# Patient Record
Sex: Male | Born: 1950 | Race: Black or African American | Hispanic: No | Smoking: Former smoker
Health system: Southern US, Community
[De-identification: ages and names within clinical notes are randomized; demographics above are authoritative.]

## PROBLEM LIST (undated history)

## (undated) DIAGNOSIS — K759 Inflammatory liver disease, unspecified: Secondary | ICD-10-CM

## (undated) DIAGNOSIS — D649 Anemia, unspecified: Secondary | ICD-10-CM

## (undated) DIAGNOSIS — C801 Malignant (primary) neoplasm, unspecified: Secondary | ICD-10-CM

## (undated) DIAGNOSIS — D759 Disease of blood and blood-forming organs, unspecified: Secondary | ICD-10-CM

## (undated) DIAGNOSIS — I10 Essential (primary) hypertension: Secondary | ICD-10-CM

## (undated) DIAGNOSIS — N289 Disorder of kidney and ureter, unspecified: Secondary | ICD-10-CM

## (undated) HISTORY — PX: OTHER SURGICAL HISTORY: SHX169

## (undated) HISTORY — PX: LUNG SURGERY: SHX703

---

## 1998-04-10 ENCOUNTER — Emergency Department (HOSPITAL_COMMUNITY): Admission: EM | Admit: 1998-04-10 | Discharge: 1998-04-10 | Payer: Self-pay | Admitting: Emergency Medicine

## 2000-07-24 ENCOUNTER — Emergency Department (HOSPITAL_COMMUNITY): Admission: EM | Admit: 2000-07-24 | Discharge: 2000-07-24 | Payer: Self-pay | Admitting: Emergency Medicine

## 2000-08-22 ENCOUNTER — Emergency Department (HOSPITAL_COMMUNITY): Admission: EM | Admit: 2000-08-22 | Discharge: 2000-08-22 | Payer: Self-pay | Admitting: Emergency Medicine

## 2001-01-05 ENCOUNTER — Encounter: Payer: Self-pay | Admitting: Emergency Medicine

## 2001-01-05 ENCOUNTER — Emergency Department (HOSPITAL_COMMUNITY): Admission: EM | Admit: 2001-01-05 | Discharge: 2001-01-05 | Payer: Self-pay | Admitting: Emergency Medicine

## 2004-07-09 ENCOUNTER — Emergency Department (HOSPITAL_COMMUNITY): Admission: EM | Admit: 2004-07-09 | Discharge: 2004-07-09 | Payer: Self-pay | Admitting: Family Medicine

## 2006-04-16 ENCOUNTER — Emergency Department (HOSPITAL_COMMUNITY): Admission: EM | Admit: 2006-04-16 | Discharge: 2006-04-16 | Payer: Self-pay | Admitting: Family Medicine

## 2006-05-03 ENCOUNTER — Emergency Department (HOSPITAL_COMMUNITY): Admission: EM | Admit: 2006-05-03 | Discharge: 2006-05-03 | Payer: Self-pay | Admitting: Family Medicine

## 2006-07-02 ENCOUNTER — Emergency Department (HOSPITAL_COMMUNITY): Admission: EM | Admit: 2006-07-02 | Discharge: 2006-07-02 | Payer: Self-pay | Admitting: Emergency Medicine

## 2006-07-15 ENCOUNTER — Ambulatory Visit (HOSPITAL_COMMUNITY): Admission: RE | Admit: 2006-07-15 | Discharge: 2006-07-15 | Payer: Self-pay | Admitting: Internal Medicine

## 2006-07-15 ENCOUNTER — Ambulatory Visit: Payer: Self-pay | Admitting: Internal Medicine

## 2006-08-02 ENCOUNTER — Ambulatory Visit: Payer: Self-pay | Admitting: Internal Medicine

## 2006-09-16 ENCOUNTER — Ambulatory Visit: Payer: Self-pay | Admitting: Internal Medicine

## 2006-10-22 ENCOUNTER — Ambulatory Visit: Payer: Self-pay | Admitting: Internal Medicine

## 2006-12-03 ENCOUNTER — Ambulatory Visit: Payer: Self-pay | Admitting: Gastroenterology

## 2006-12-17 ENCOUNTER — Ambulatory Visit: Payer: Self-pay | Admitting: Internal Medicine

## 2006-12-17 LAB — CONVERTED CEMR LAB
BUN: 14 mg/dL (ref 6–23)
CO2: 27 meq/L (ref 19–32)
Cholesterol: 108 mg/dL (ref 0–200)
Creatinine, Ser: 1.4 mg/dL (ref 0.4–1.5)
Glucose, Bld: 187 mg/dL — ABNORMAL HIGH (ref 70–99)
HDL: 39.2 mg/dL (ref >39.0)
Microalb, Ur: 36 mg/dL (ref 0.0–1.9)
Potassium: 3.9 meq/L (ref 3.5–5.1)
Sodium: 142 meq/L (ref 135–145)
Total CHOL/HDL Ratio: 2.8
Triglycerides: 170 mg/dL — ABNORMAL HIGH (ref 0–149)

## 2007-01-20 ENCOUNTER — Ambulatory Visit: Payer: Self-pay | Admitting: Internal Medicine

## 2007-02-04 ENCOUNTER — Ambulatory Visit: Payer: Self-pay | Admitting: Internal Medicine

## 2007-02-04 LAB — CONVERTED CEMR LAB
BUN: 17 mg/dL (ref 6–23)
CO2: 27 meq/L (ref 19–32)
Calcium: 9 mg/dL (ref 8.4–10.5)
Chloride: 114 meq/L — ABNORMAL HIGH (ref 96–112)
GFR calc Af Amer: 62 mL/min

## 2007-02-24 ENCOUNTER — Ambulatory Visit: Payer: Self-pay | Admitting: Internal Medicine

## 2007-04-08 ENCOUNTER — Ambulatory Visit: Payer: Self-pay | Admitting: Internal Medicine

## 2007-04-08 LAB — CONVERTED CEMR LAB
Calcium: 8.9 mg/dL (ref 8.4–10.5)
Chloride: 109 meq/L (ref 96–112)
Creatinine, Ser: 1.8 mg/dL — ABNORMAL HIGH (ref 0.4–1.5)
Creatinine,U: 210 mg/dL
GFR calc non Af Amer: 42 mL/min
Glucose, Bld: 237 mg/dL — ABNORMAL HIGH (ref 70–99)
Hgb A1c MFr Bld: 7.6 % — ABNORMAL HIGH (ref 4.6–6.0)
Microalb Creat Ratio: 68.1 mg/g — ABNORMAL HIGH (ref 0.0–30.0)

## 2007-05-27 ENCOUNTER — Encounter: Payer: Self-pay | Admitting: Internal Medicine

## 2007-05-27 DIAGNOSIS — Z992 Dependence on renal dialysis: Secondary | ICD-10-CM

## 2007-05-27 DIAGNOSIS — F1011 Alcohol abuse, in remission: Secondary | ICD-10-CM | POA: Insufficient documentation

## 2007-05-27 DIAGNOSIS — N186 End stage renal disease: Secondary | ICD-10-CM

## 2007-05-27 DIAGNOSIS — F1411 Cocaine abuse, in remission: Secondary | ICD-10-CM | POA: Insufficient documentation

## 2007-05-27 DIAGNOSIS — E1122 Type 2 diabetes mellitus with diabetic chronic kidney disease: Secondary | ICD-10-CM

## 2007-05-27 DIAGNOSIS — F1111 Opioid abuse, in remission: Secondary | ICD-10-CM | POA: Insufficient documentation

## 2007-05-27 DIAGNOSIS — F528 Other sexual dysfunction not due to a substance or known physiological condition: Secondary | ICD-10-CM

## 2007-05-27 DIAGNOSIS — B1710 Acute hepatitis C without hepatic coma: Secondary | ICD-10-CM | POA: Insufficient documentation

## 2007-07-25 ENCOUNTER — Ambulatory Visit: Payer: Self-pay | Admitting: Internal Medicine

## 2007-07-25 LAB — CONVERTED CEMR LAB
Chloride: 114 meq/L — ABNORMAL HIGH (ref 96–112)
Creatinine,U: 86.4 mg/dL
GFR calc non Af Amer: 51 mL/min
Potassium: 3.7 meq/L (ref 3.5–5.1)
Sodium: 148 meq/L — ABNORMAL HIGH (ref 135–145)

## 2007-09-03 ENCOUNTER — Encounter: Admission: RE | Admit: 2007-09-03 | Discharge: 2007-09-03 | Payer: Self-pay | Admitting: Internal Medicine

## 2007-11-21 IMAGING — CR DG RIBS W/ CHEST 3+V*L*
3 series · 3 of 3 positions shown · non-contrast
Comparison: 07/09/04.

CLINICAL DATA: Left chest trauma after a fall.
 LEFT RIBS WITH CHEST ? 3 VIEW:

[view not recorded (1 of 3)]
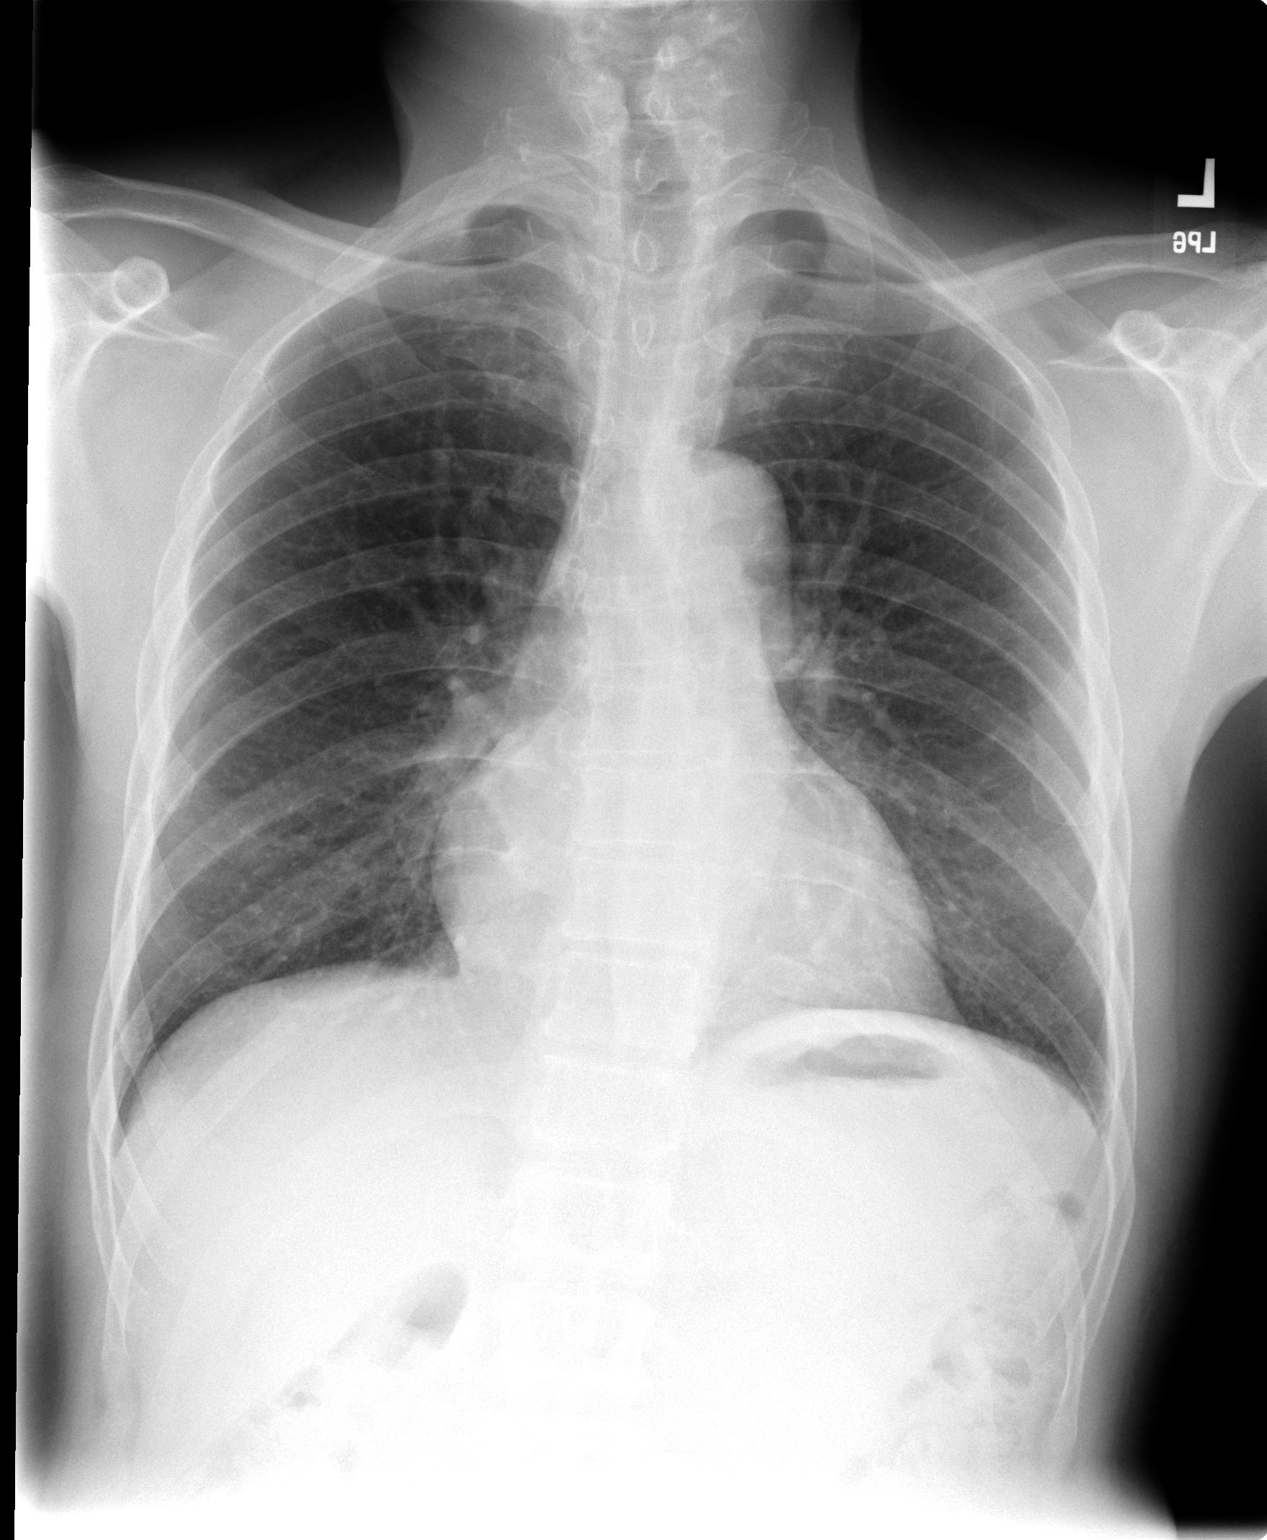

[view not recorded (2 of 3)]
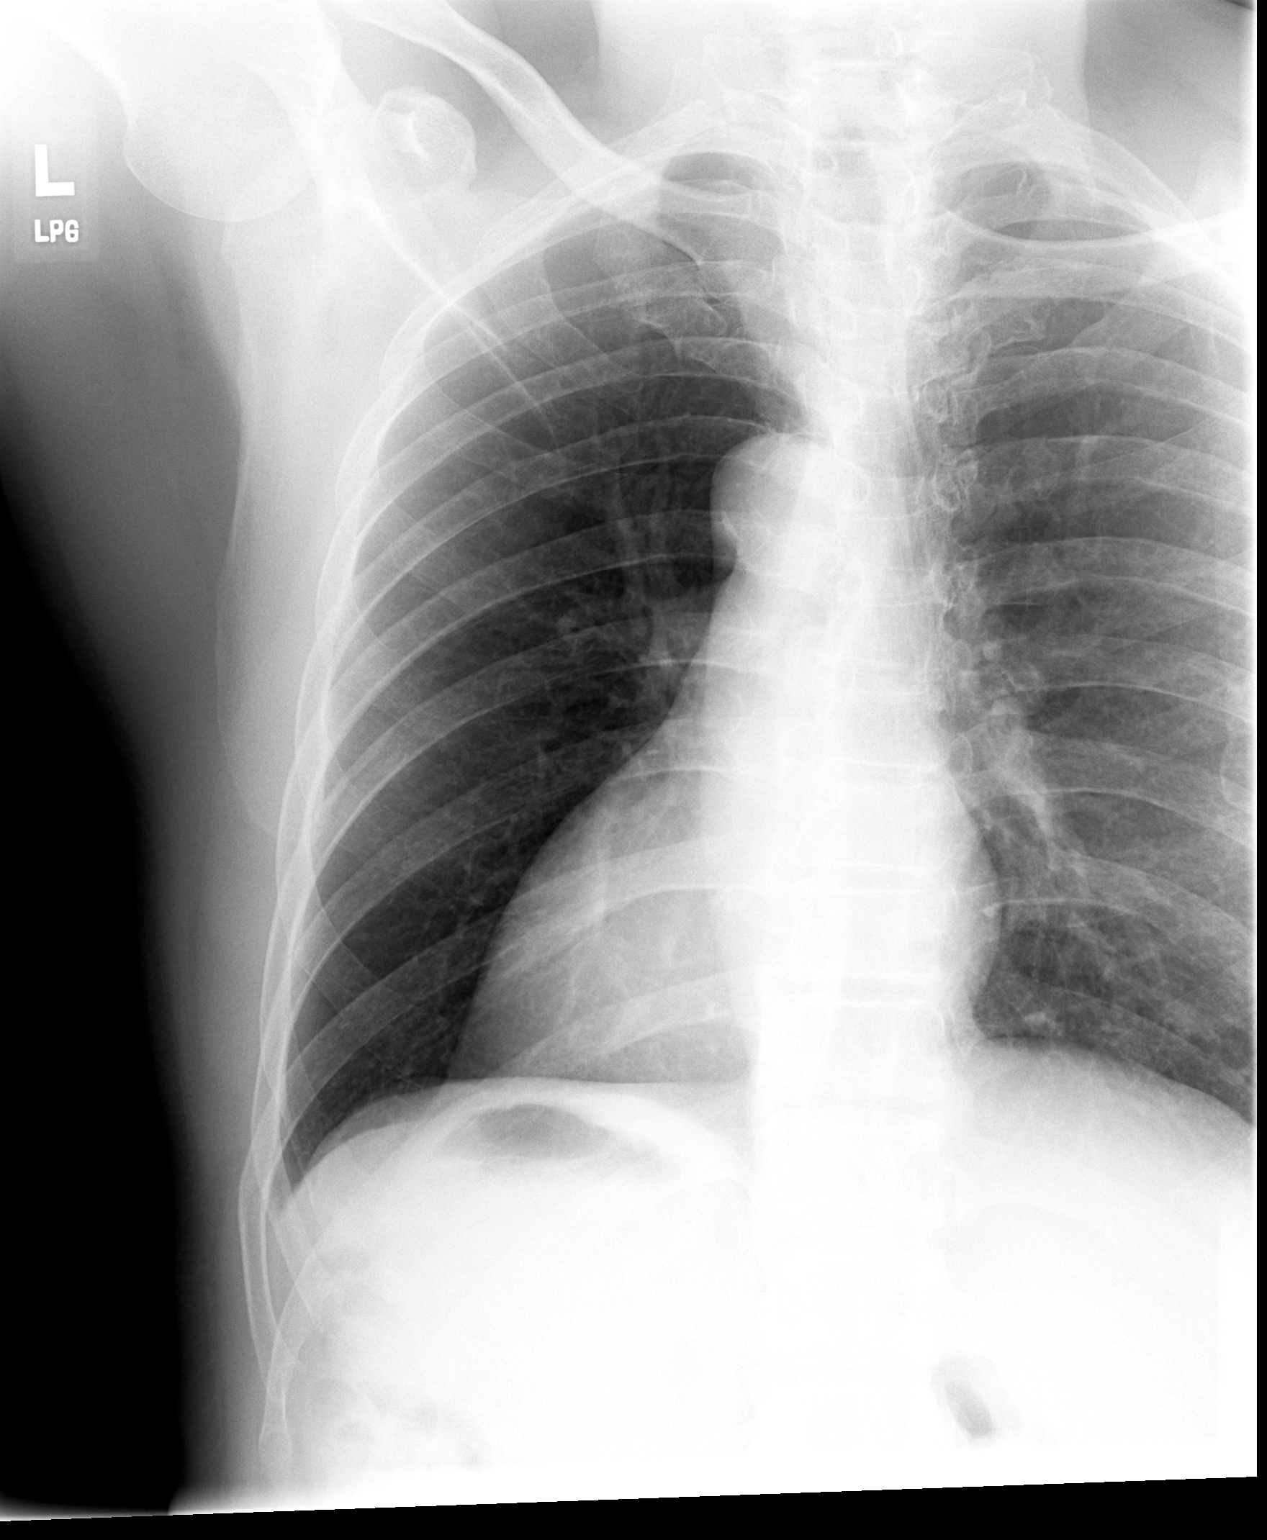

[view not recorded (3 of 3)]
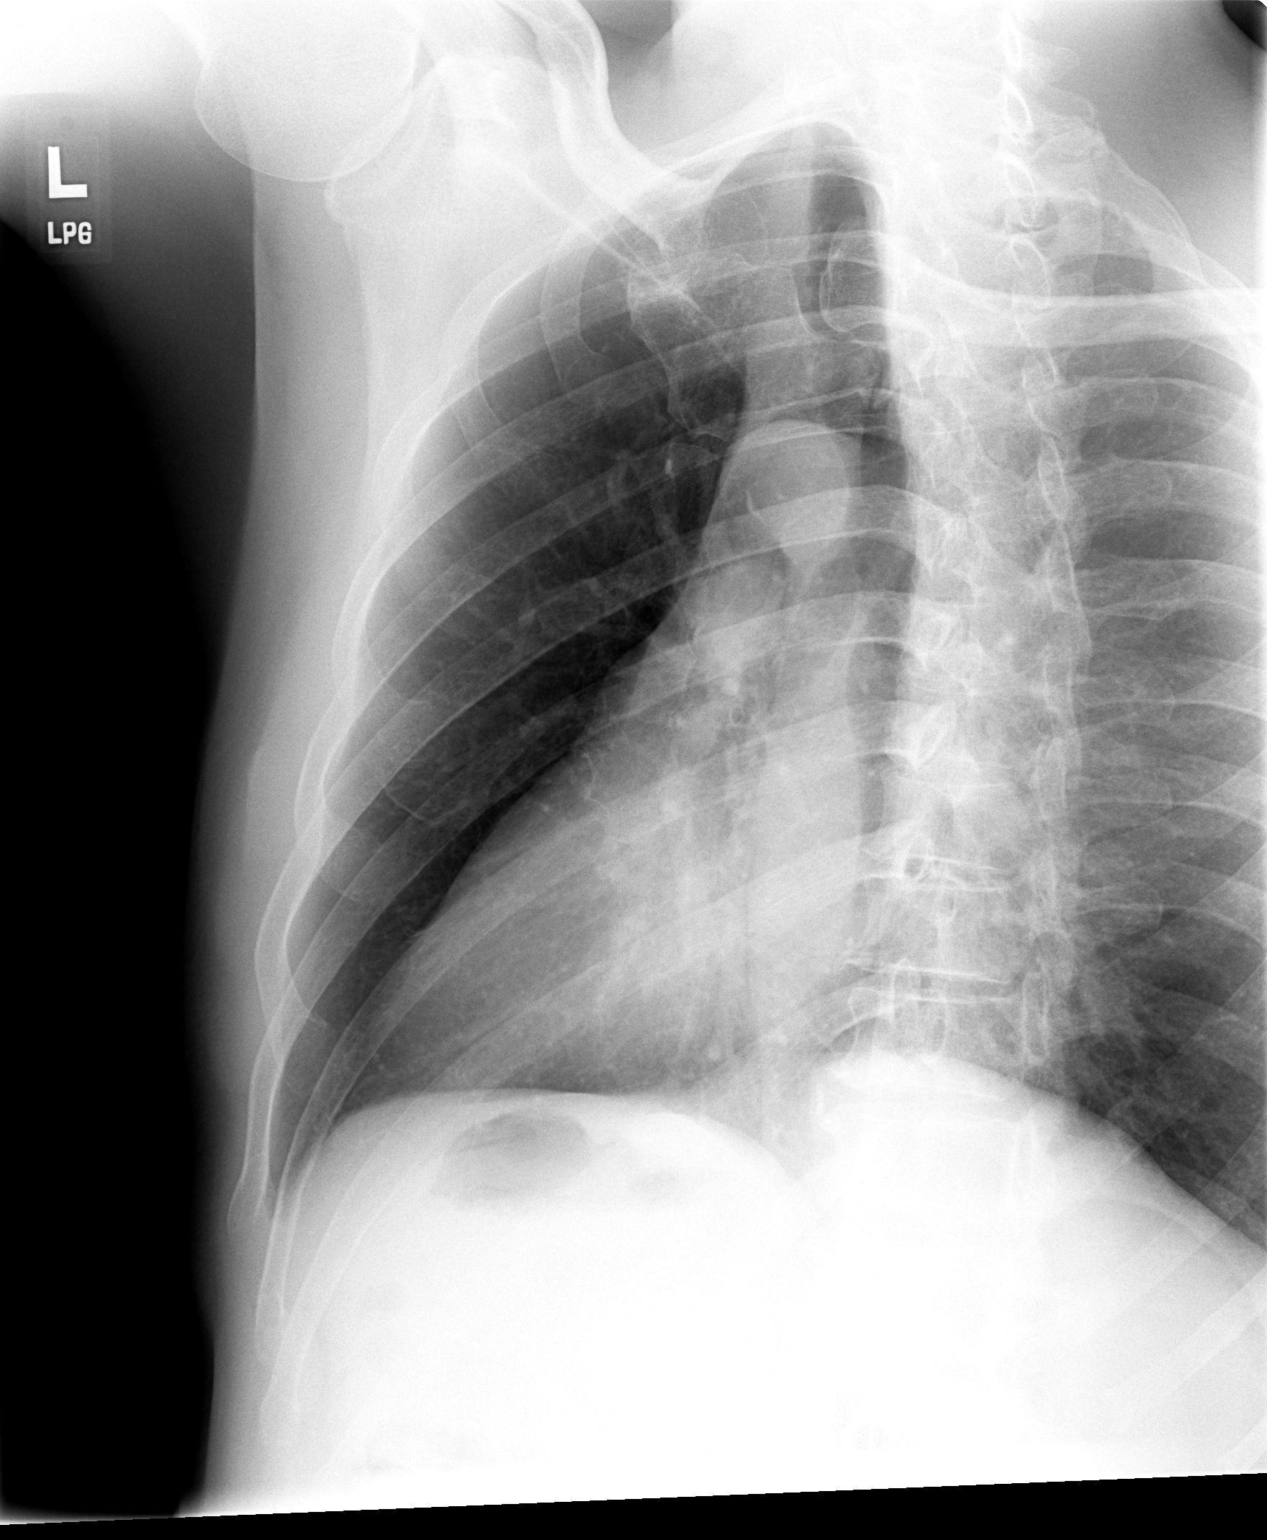

[3 of 3 positions shown; findings below may reference images not displayed]

FINDINGS: Frontal view of the chest shows midline trachea and stable heart size.  Lungs are clear.  No pneumothorax.  No pleural fluid.  
 Dedicated views of the left ribs show no definite acute fracture.
IMPRESSION: No acute findings.

## 2007-11-25 ENCOUNTER — Ambulatory Visit: Payer: Self-pay | Admitting: Internal Medicine

## 2007-11-25 DIAGNOSIS — I10 Essential (primary) hypertension: Secondary | ICD-10-CM

## 2007-11-28 ENCOUNTER — Telehealth: Payer: Self-pay | Admitting: Internal Medicine

## 2007-11-28 LAB — CONVERTED CEMR LAB
BUN: 22 mg/dL
CO2: 30 meq/L
Calcium: 9.1 mg/dL
Chloride: 106 meq/L
Creatinine, Ser: 1.9 mg/dL — ABNORMAL HIGH
Creatinine,U: 126.4 mg/dL
GFR calc Af Amer: 47 mL/min
GFR calc non Af Amer: 39 mL/min
Glucose, Bld: 308 mg/dL — ABNORMAL HIGH
Hgb A1c MFr Bld: 8.2 % — ABNORMAL HIGH
Microalb Creat Ratio: 1318.8 mg/g — ABNORMAL HIGH
Microalb, Ur: 166.7 mg/dL — ABNORMAL HIGH
Potassium: 4.5 meq/L
Sodium: 144 meq/L

## 2007-12-03 ENCOUNTER — Telehealth: Payer: Self-pay | Admitting: Internal Medicine

## 2008-01-09 ENCOUNTER — Telehealth: Payer: Self-pay | Admitting: Internal Medicine

## 2008-01-26 ENCOUNTER — Inpatient Hospital Stay (HOSPITAL_COMMUNITY): Admission: EM | Admit: 2008-01-26 | Discharge: 2008-02-01 | Payer: Self-pay | Admitting: Emergency Medicine

## 2008-01-27 ENCOUNTER — Ambulatory Visit: Payer: Self-pay | Admitting: Internal Medicine

## 2008-02-01 ENCOUNTER — Telehealth: Payer: Self-pay | Admitting: Internal Medicine

## 2008-02-09 ENCOUNTER — Ambulatory Visit: Payer: Self-pay | Admitting: Internal Medicine

## 2008-02-09 DIAGNOSIS — IMO0002 Reserved for concepts with insufficient information to code with codable children: Secondary | ICD-10-CM

## 2008-02-09 LAB — CONVERTED CEMR LAB: Vit D, 1,25-Dihydroxy: 26 — ABNORMAL LOW (ref 30–89)

## 2008-02-10 ENCOUNTER — Telehealth: Payer: Self-pay | Admitting: Internal Medicine

## 2008-03-04 ENCOUNTER — Ambulatory Visit: Payer: Self-pay | Admitting: Gastroenterology

## 2008-03-19 ENCOUNTER — Ambulatory Visit: Payer: Self-pay | Admitting: Internal Medicine

## 2008-03-19 DIAGNOSIS — E559 Vitamin D deficiency, unspecified: Secondary | ICD-10-CM | POA: Insufficient documentation

## 2008-04-08 ENCOUNTER — Ambulatory Visit: Payer: Self-pay | Admitting: Gastroenterology

## 2008-04-27 ENCOUNTER — Telehealth: Payer: Self-pay | Admitting: Internal Medicine

## 2008-04-30 ENCOUNTER — Ambulatory Visit: Payer: Self-pay | Admitting: Internal Medicine

## 2008-08-24 ENCOUNTER — Ambulatory Visit: Payer: Self-pay | Admitting: Gastroenterology

## 2009-09-29 ENCOUNTER — Inpatient Hospital Stay (HOSPITAL_COMMUNITY): Admission: EM | Admit: 2009-09-29 | Discharge: 2009-10-04 | Payer: Self-pay | Admitting: Emergency Medicine

## 2010-11-12 LAB — CONVERTED CEMR LAB
CO2: 26 meq/L (ref 19–32)
Calcium: 8.8 mg/dL (ref 8.4–10.5)
Chloride: 108 meq/L (ref 96–112)
Sodium: 140 meq/L (ref 135–145)

## 2011-01-15 LAB — GLUCOSE, CAPILLARY
Glucose-Capillary: 102 mg/dL — ABNORMAL HIGH (ref 70–99)
Glucose-Capillary: 110 mg/dL — ABNORMAL HIGH (ref 70–99)
Glucose-Capillary: 117 mg/dL — ABNORMAL HIGH (ref 70–99)
Glucose-Capillary: 124 mg/dL — ABNORMAL HIGH (ref 70–99)
Glucose-Capillary: 126 mg/dL — ABNORMAL HIGH (ref 70–99)
Glucose-Capillary: 129 mg/dL — ABNORMAL HIGH (ref 70–99)
Glucose-Capillary: 142 mg/dL — ABNORMAL HIGH (ref 70–99)
Glucose-Capillary: 145 mg/dL — ABNORMAL HIGH (ref 70–99)
Glucose-Capillary: 157 mg/dL — ABNORMAL HIGH (ref 70–99)
Glucose-Capillary: 159 mg/dL — ABNORMAL HIGH (ref 70–99)
Glucose-Capillary: 160 mg/dL — ABNORMAL HIGH (ref 70–99)
Glucose-Capillary: 160 mg/dL — ABNORMAL HIGH (ref 70–99)
Glucose-Capillary: 168 mg/dL — ABNORMAL HIGH (ref 70–99)
Glucose-Capillary: 194 mg/dL — ABNORMAL HIGH (ref 70–99)
Glucose-Capillary: 209 mg/dL — ABNORMAL HIGH (ref 70–99)
Glucose-Capillary: 217 mg/dL — ABNORMAL HIGH (ref 70–99)
Glucose-Capillary: 269 mg/dL — ABNORMAL HIGH (ref 70–99)
Glucose-Capillary: 415 mg/dL — ABNORMAL HIGH (ref 70–99)
Glucose-Capillary: 51 mg/dL — ABNORMAL LOW (ref 70–99)
Glucose-Capillary: 69 mg/dL — ABNORMAL LOW (ref 70–99)
Glucose-Capillary: 95 mg/dL (ref 70–99)

## 2011-01-15 LAB — CBC
HCT: 32.4 % — ABNORMAL LOW (ref 39.0–52.0)
HCT: 43.2 % (ref 39.0–52.0)
Hemoglobin: 11.2 g/dL — ABNORMAL LOW (ref 13.0–17.0)
Hemoglobin: 13.1 g/dL (ref 13.0–17.0)
MCHC: 33.7 g/dL (ref 30.0–36.0)
MCHC: 33.7 g/dL (ref 30.0–36.0)
MCHC: 34.1 g/dL (ref 30.0–36.0)
MCHC: 34.1 g/dL (ref 30.0–36.0)
MCHC: 34.7 g/dL (ref 30.0–36.0)
Platelets: 157 10*3/uL (ref 150–400)
Platelets: 174 10*3/uL (ref 150–400)
Platelets: 213 10*3/uL (ref 150–400)
RBC: 3.13 MIL/uL — ABNORMAL LOW (ref 4.22–5.81)
RBC: 3.83 MIL/uL — ABNORMAL LOW (ref 4.22–5.81)
RDW: 13.9 % (ref 11.5–15.5)
RDW: 14.2 % (ref 11.5–15.5)
RDW: 14.9 % (ref 11.5–15.5)
WBC: 12.3 10*3/uL — ABNORMAL HIGH (ref 4.0–10.5)
WBC: 5.6 10*3/uL (ref 4.0–10.5)

## 2011-01-15 LAB — BASIC METABOLIC PANEL
BUN: 40 mg/dL — ABNORMAL HIGH (ref 6–23)
BUN: 75 mg/dL — ABNORMAL HIGH (ref 6–23)
CO2: 25 mEq/L (ref 19–32)
CO2: 27 mEq/L (ref 19–32)
Calcium: 7.8 mg/dL — ABNORMAL LOW (ref 8.4–10.5)
Calcium: 8.2 mg/dL — ABNORMAL LOW (ref 8.4–10.5)
Creatinine, Ser: 1.74 mg/dL — ABNORMAL HIGH (ref 0.4–1.5)
Creatinine, Ser: 2.32 mg/dL — ABNORMAL HIGH (ref 0.4–1.5)
GFR calc non Af Amer: 11 mL/min — ABNORMAL LOW (ref >60)
GFR calc non Af Amer: 29 mL/min — ABNORMAL LOW (ref >60)
Glucose, Bld: 112 mg/dL — ABNORMAL HIGH (ref 70–99)
Glucose, Bld: 124 mg/dL — ABNORMAL HIGH (ref 70–99)
Potassium: 3.5 mEq/L (ref 3.5–5.1)
Sodium: 139 mEq/L (ref 135–145)
Sodium: 142 mEq/L (ref 135–145)

## 2011-01-15 LAB — CLOSTRIDIUM DIFFICILE EIA: C difficile Toxins A+B, EIA: NEGATIVE

## 2011-01-15 LAB — DIFFERENTIAL
Basophils Absolute: 0 10*3/uL (ref 0.0–0.1)
Basophils Absolute: 0 10*3/uL (ref 0.0–0.1)
Basophils Relative: 0 % (ref 0–1)
Basophils Relative: 0 % (ref 0–1)
Eosinophils Absolute: 0 10*3/uL (ref 0.0–0.7)
Eosinophils Absolute: 0.1 10*3/uL (ref 0.0–0.7)
Eosinophils Relative: 0 % (ref 0–5)
Eosinophils Relative: 2 % (ref 0–5)
Lymphocytes Relative: 18 % (ref 12–46)
Lymphocytes Relative: 33 % (ref 12–46)
Lymphocytes Relative: 7 % — ABNORMAL LOW (ref 12–46)
Lymphs Abs: 0.9 10*3/uL (ref 0.7–4.0)
Lymphs Abs: 1.3 10*3/uL (ref 0.7–4.0)
Lymphs Abs: 1.7 10*3/uL (ref 0.7–4.0)
Lymphs Abs: 1.9 10*3/uL (ref 0.7–4.0)
Monocytes Absolute: 0.6 10*3/uL (ref 0.1–1.0)
Monocytes Absolute: 0.6 10*3/uL (ref 0.1–1.0)
Monocytes Relative: 10 % (ref 3–12)
Monocytes Relative: 12 % (ref 3–12)
Neutro Abs: 10.8 10*3/uL — ABNORMAL HIGH (ref 1.7–7.7)
Neutro Abs: 3.2 10*3/uL (ref 1.7–7.7)
Neutro Abs: 4.2 10*3/uL (ref 1.7–7.7)
Neutrophils Relative %: 55 % (ref 43–77)
Neutrophils Relative %: 71 % (ref 43–77)
Neutrophils Relative %: 76 % (ref 43–77)

## 2011-01-15 LAB — CULTURE, BLOOD (ROUTINE X 2)

## 2011-01-15 LAB — PHOSPHORUS
Phosphorus: 1.8 mg/dL — ABNORMAL LOW (ref 2.3–4.6)
Phosphorus: 2.4 mg/dL (ref 2.3–4.6)

## 2011-01-15 LAB — COMPREHENSIVE METABOLIC PANEL
ALT: 18 U/L (ref 0–53)
ALT: 24 U/L (ref 0–53)
AST: 27 U/L (ref 0–37)
Albumin: 2.7 g/dL — ABNORMAL LOW (ref 3.5–5.2)
Alkaline Phosphatase: 86 U/L (ref 39–117)
BUN: 23 mg/dL (ref 6–23)
CO2: 25 mEq/L (ref 19–32)
CO2: 32 mEq/L (ref 19–32)
Calcium: 7.7 mg/dL — ABNORMAL LOW (ref 8.4–10.5)
Calcium: 7.8 mg/dL — ABNORMAL LOW (ref 8.4–10.5)
Calcium: 8 mg/dL — ABNORMAL LOW (ref 8.4–10.5)
GFR calc Af Amer: 49 mL/min — ABNORMAL LOW (ref >60)
GFR calc non Af Amer: 14 mL/min — ABNORMAL LOW (ref >60)
GFR calc non Af Amer: 41 mL/min — ABNORMAL LOW (ref >60)
Glucose, Bld: 115 mg/dL — ABNORMAL HIGH (ref 70–99)
Glucose, Bld: 94 mg/dL (ref 70–99)
Potassium: 3.5 mEq/L (ref 3.5–5.1)
Sodium: 140 mEq/L (ref 135–145)
Sodium: 143 mEq/L (ref 135–145)
Total Bilirubin: 0.7 mg/dL (ref 0.3–1.2)
Total Protein: 6 g/dL (ref 6.0–8.3)

## 2011-01-15 LAB — POCT I-STAT 3, VENOUS BLOOD GAS (G3P V)
Bicarbonate: 40.1 mEq/L — ABNORMAL HIGH (ref 20.0–24.0)
TCO2: 42 mmol/L (ref 0–100)
pCO2, Ven: 47.9 mmHg (ref 45.0–50.0)
pH, Ven: 7.531 — ABNORMAL HIGH (ref 7.250–7.300)
pO2, Ven: 22 mmHg — CL (ref 30.0–45.0)

## 2011-01-15 LAB — CK TOTAL AND CKMB (NOT AT ARMC)
CK, MB: 9.7 ng/mL — ABNORMAL HIGH (ref 0.3–4.0)
Relative Index: 0.9 (ref 0.0–2.5)

## 2011-01-15 LAB — GRAM STAIN

## 2011-01-15 LAB — TROPONIN I: Troponin I: 0.11 ng/mL — ABNORMAL HIGH (ref 0.00–0.06)

## 2011-01-15 LAB — LACTIC ACID, PLASMA
Lactic Acid, Venous: 0.9 mmol/L (ref 0.5–2.2)
Lactic Acid, Venous: 1.3 mmol/L (ref 0.5–2.2)

## 2011-01-15 LAB — URINE DRUGS OF ABUSE SCREEN W ALC, ROUTINE (REF LAB)
Amphetamine Screen, Ur: NEGATIVE
Barbiturate Quant, Ur: NEGATIVE
Benzodiazepines.: NEGATIVE
Marijuana Metabolite: POSITIVE — AB
Methadone: NEGATIVE
Phencyclidine (PCP): NEGATIVE

## 2011-01-15 LAB — MAGNESIUM
Magnesium: 1.7 mg/dL (ref 1.5–2.5)
Magnesium: 2.2 mg/dL (ref 1.5–2.5)
Magnesium: 2.5 mg/dL (ref 1.5–2.5)

## 2011-01-15 LAB — CARDIAC PANEL(CRET KIN+CKTOT+MB+TROPI)
CK, MB: 6.3 ng/mL — ABNORMAL HIGH (ref 0.3–4.0)
CK, MB: 6.9 ng/mL — ABNORMAL HIGH (ref 0.3–4.0)

## 2011-01-15 LAB — URINALYSIS, ROUTINE W REFLEX MICROSCOPIC
Glucose, UA: NEGATIVE mg/dL
Ketones, ur: NEGATIVE mg/dL
Protein, ur: 100 mg/dL — AB

## 2011-01-15 LAB — URINE MICROSCOPIC-ADD ON

## 2011-01-15 LAB — HEMOGLOBIN A1C
Hgb A1c MFr Bld: 6.8 % — ABNORMAL HIGH (ref 4.6–6.1)
Mean Plasma Glucose: 148 mg/dL

## 2011-01-15 LAB — LIPASE, BLOOD: Lipase: 37 U/L (ref 11–59)

## 2011-01-15 LAB — URINE CULTURE: Colony Count: 2000

## 2011-01-15 LAB — KETONES, QUALITATIVE: Acetone, Bld: NEGATIVE

## 2011-01-15 LAB — TSH: TSH: 0.568 u[IU]/mL (ref 0.350–4.500)

## 2011-02-14 ENCOUNTER — Inpatient Hospital Stay (HOSPITAL_COMMUNITY)
Admission: EM | Admit: 2011-02-14 | Discharge: 2011-02-17 | DRG: 392 | Disposition: A | Discharge status: Home / Self Care | Payer: Medicare Other | Source: Ambulatory Visit | Attending: Internal Medicine | Admitting: Internal Medicine

## 2011-02-14 ENCOUNTER — Emergency Department (HOSPITAL_COMMUNITY): Payer: Medicare Other

## 2011-02-14 DIAGNOSIS — Z794 Long term (current) use of insulin: Secondary | ICD-10-CM

## 2011-02-14 DIAGNOSIS — K208 Other esophagitis without bleeding: Principal | ICD-10-CM | POA: Diagnosis present

## 2011-02-14 DIAGNOSIS — I129 Hypertensive chronic kidney disease with stage 1 through stage 4 chronic kidney disease, or unspecified chronic kidney disease: Secondary | ICD-10-CM | POA: Diagnosis present

## 2011-02-14 DIAGNOSIS — Z7982 Long term (current) use of aspirin: Secondary | ICD-10-CM

## 2011-02-14 DIAGNOSIS — F1010 Alcohol abuse, uncomplicated: Secondary | ICD-10-CM | POA: Diagnosis present

## 2011-02-14 DIAGNOSIS — F1210 Cannabis abuse, uncomplicated: Secondary | ICD-10-CM | POA: Diagnosis present

## 2011-02-14 DIAGNOSIS — B1920 Unspecified viral hepatitis C without hepatic coma: Secondary | ICD-10-CM | POA: Diagnosis present

## 2011-02-14 DIAGNOSIS — E119 Type 2 diabetes mellitus without complications: Secondary | ICD-10-CM | POA: Diagnosis present

## 2011-02-14 DIAGNOSIS — F17200 Nicotine dependence, unspecified, uncomplicated: Secondary | ICD-10-CM | POA: Diagnosis present

## 2011-02-14 DIAGNOSIS — N183 Chronic kidney disease, stage 3 unspecified: Secondary | ICD-10-CM | POA: Diagnosis present

## 2011-02-14 DIAGNOSIS — N179 Acute kidney failure, unspecified: Secondary | ICD-10-CM | POA: Diagnosis present

## 2011-02-14 DIAGNOSIS — E785 Hyperlipidemia, unspecified: Secondary | ICD-10-CM | POA: Diagnosis present

## 2011-02-14 LAB — CARDIAC PANEL(CRET KIN+CKTOT+MB+TROPI)
CK, MB: 4.1 ng/mL — ABNORMAL HIGH (ref 0.3–4.0)
Relative Index: 1.2 (ref 0.0–2.5)
Relative Index: 1.3 (ref 0.0–2.5)
Total CK: 344 U/L — ABNORMAL HIGH (ref 7–232)
Total CK: 379 U/L — ABNORMAL HIGH (ref 7–232)
Troponin I: <0.3 ng/mL (ref <0.30)

## 2011-02-14 LAB — URINALYSIS, ROUTINE W REFLEX MICROSCOPIC
Bilirubin Urine: NEGATIVE
Ketones, ur: NEGATIVE mg/dL
Nitrite: NEGATIVE
Protein, ur: 100 mg/dL — AB
Urobilinogen, UA: 0.2 mg/dL (ref 0.0–1.0)
pH: 7.5 (ref 5.0–8.0)

## 2011-02-14 LAB — CBC
HCT: 35.5 % — ABNORMAL LOW (ref 39.0–52.0)
MCH: 33.2 pg (ref 26.0–34.0)
MCHC: 34.6 g/dL (ref 30.0–36.0)
RDW: 13.2 % (ref 11.5–15.5)

## 2011-02-14 LAB — POCT I-STAT, CHEM 8
Calcium, Ion: 0.89 mmol/L — ABNORMAL LOW (ref 1.12–1.32)
Chloride: 100 mEq/L (ref 96–112)
Glucose, Bld: 278 mg/dL — ABNORMAL HIGH (ref 70–99)
HCT: 38 % — ABNORMAL LOW (ref 39.0–52.0)

## 2011-02-14 LAB — COMPREHENSIVE METABOLIC PANEL
ALT: 22 U/L (ref 0–53)
Albumin: 3.4 g/dL — ABNORMAL LOW (ref 3.5–5.2)
Alkaline Phosphatase: 106 U/L (ref 39–117)
BUN: 79 mg/dL — ABNORMAL HIGH (ref 6–23)
Chloride: 90 mEq/L — ABNORMAL LOW (ref 96–112)
Potassium: 3.3 mEq/L — ABNORMAL LOW (ref 3.5–5.1)
Sodium: 133 mEq/L — ABNORMAL LOW (ref 135–145)
Total Bilirubin: 0.6 mg/dL (ref 0.3–1.2)

## 2011-02-14 LAB — POCT CARDIAC MARKERS
CKMB, poc: 2.4 ng/mL (ref 1.0–8.0)
Myoglobin, poc: >500 ng/mL (ref 12–200)
Myoglobin, poc: >500 ng/mL (ref 12–200)
Troponin i, poc: <0.05 ng/mL (ref 0.00–0.09)

## 2011-02-14 LAB — GLUCOSE, CAPILLARY
Glucose-Capillary: 186 mg/dL — ABNORMAL HIGH (ref 70–99)
Glucose-Capillary: 75 mg/dL (ref 70–99)
Glucose-Capillary: 133 mg/dL — ABNORMAL HIGH (ref 70–99)

## 2011-02-14 LAB — PROTIME-INR: Prothrombin Time: 12.1 seconds (ref 11.6–15.2)

## 2011-02-15 ENCOUNTER — Other Ambulatory Visit: Payer: Self-pay | Admitting: Gastroenterology

## 2011-02-15 LAB — CBC
HCT: 31 % — ABNORMAL LOW (ref 39.0–52.0)
MCHC: 33.9 g/dL (ref 30.0–36.0)
MCV: 96.6 fL (ref 78.0–100.0)
Platelets: 181 10*3/uL (ref 150–400)
RDW: 13.3 % (ref 11.5–15.5)
WBC: 5.9 10*3/uL (ref 4.0–10.5)

## 2011-02-15 LAB — COMPREHENSIVE METABOLIC PANEL
ALT: 18 U/L (ref 0–53)
AST: 29 U/L (ref 0–37)
Alkaline Phosphatase: 78 U/L (ref 39–117)
CO2: 19 mEq/L (ref 19–32)
Calcium: 7.4 mg/dL — ABNORMAL LOW (ref 8.4–10.5)
GFR calc Af Amer: 27 mL/min — ABNORMAL LOW (ref >60)
Glucose, Bld: 149 mg/dL — ABNORMAL HIGH (ref 70–99)
Potassium: 3.4 mEq/L — ABNORMAL LOW (ref 3.5–5.1)
Sodium: 141 mEq/L (ref 135–145)
Total Protein: 6.1 g/dL (ref 6.0–8.3)

## 2011-02-15 LAB — GLUCOSE, CAPILLARY
Glucose-Capillary: 106 mg/dL — ABNORMAL HIGH (ref 70–99)
Glucose-Capillary: 272 mg/dL — ABNORMAL HIGH (ref 70–99)

## 2011-02-15 LAB — CARDIAC PANEL(CRET KIN+CKTOT+MB+TROPI)
Relative Index: 1.2 (ref 0.0–2.5)
Troponin I: <0.3 ng/mL (ref <0.30)

## 2011-02-15 LAB — HEMOGLOBIN A1C
Hgb A1c MFr Bld: 7.5 % — ABNORMAL HIGH (ref <5.7)
Mean Plasma Glucose: 169 mg/dL — ABNORMAL HIGH (ref <117)

## 2011-02-16 LAB — BASIC METABOLIC PANEL
BUN: 27 mg/dL — ABNORMAL HIGH (ref 6–23)
CO2: 17 mEq/L — ABNORMAL LOW (ref 19–32)
Chloride: 112 mEq/L (ref 96–112)
Creatinine, Ser: 2.51 mg/dL — ABNORMAL HIGH (ref 0.4–1.5)
Glucose, Bld: 300 mg/dL — ABNORMAL HIGH (ref 70–99)
Potassium: 3.5 mEq/L (ref 3.5–5.1)

## 2011-02-16 LAB — CBC
HCT: 29.5 % — ABNORMAL LOW (ref 39.0–52.0)
MCH: 33.4 pg (ref 26.0–34.0)
MCV: 96.7 fL (ref 78.0–100.0)
RBC: 3.05 MIL/uL — ABNORMAL LOW (ref 4.22–5.81)
WBC: 6.4 10*3/uL (ref 4.0–10.5)

## 2011-02-16 LAB — GLUCOSE, CAPILLARY
Glucose-Capillary: 274 mg/dL — ABNORMAL HIGH (ref 70–99)
Glucose-Capillary: 96 mg/dL (ref 70–99)
Glucose-Capillary: 147 mg/dL — ABNORMAL HIGH (ref 70–99)

## 2011-02-16 LAB — HCV RNA QUANT
HCV Quantitative Log: 6.41 {Log} — ABNORMAL HIGH (ref <1.63)
HCV Quantitative: 2570000 IU/mL — ABNORMAL HIGH (ref <43)

## 2011-02-17 LAB — GLUCOSE, CAPILLARY: Glucose-Capillary: 207 mg/dL — ABNORMAL HIGH (ref 70–99)

## 2011-02-18 NOTE — Discharge Summary (Signed)
Roger Vazquez, Roger Vazquez              ACCOUNT NO.:  [id number]  MEDICAL RECORD NO.:  [id number]           PATIENT TYPE:  I  LOCATION:  [id number]                         FACILITY:  [facility]  PHYSICIAN:  Verneita Griffes, MD        DATE OF BIRTH:  1950/10/17  DATE OF ADMISSION:  02/14/2011 DATE OF DISCHARGE:  02/17/2011                              DISCHARGE SUMMARY   PERTINENT CONSULT:  Lear Ng, MD Gastroenterology.  PERTINENT IMAGING STUDIES:  Portable chest x-ray Feb 14, 2011 showed no active cardiac cardiopulmonary disease.  PERTINENT PROCEDURES DONE:  Upper endoscopy showing severe erosions gastric esophagitis affecting distal two thirds of the esophagus. Status post psychological brushing to check for yeast.  DISCHARGE MEDICATIONS: 1. Atenolol 25 mg once daily. 2. Amlodipine 10 mg once daily which is a new med. 3. The patient is to take aspart, insulin 1-9 units t.i.d. with meals     per sliding scale as follows 2 units if blood sugar before meals if     150-200, 4 units if between 201-300, 6 units if between 301-400,  8     units if above 400. 4. Lantus 10 units q.p.m. SoloSTAR.  (Please note the patient is on 30     units prior to admission). 5. Lasix 20 mg daily. 6. Aspirin 81 mg daily. 7. Sucralfate new medication 1 gram q.i.d. 8. Protonix 40 mg b.i.d..  Please see full dictation dated Feb 14, 2011 and number [phone] per myself.  Briefly, this is a 60 year old male with 2 days of nausea, vomiting who has been taking mainly fluids, states the abdominal pain is in the center of the abdomen, pressure like pain which followed up.  Denied any radiation of pain and said it was burning sort of sensation.  The patient has had this pain in the past and at baseline looks like he had esophagitis that was endoscoped by Dr. Michail Sermon in the past and was kept on Carafate.  The patient has been noncompliant with the same and has a history of using alcohol also and also drinking half pint  of Moonshine on weekends.  He smokes marijuana as well.  INITIAL LAB DATA:  Showed sodium 133, potassium 3.3, BUN, creatinine of 79 and 4.44.  Bilirubin 0.6, alk phos 106, SGOT GPT was 30 and 22 respectively.  Lipase 53.  Point care markers were negative.  HOSPITAL COURSE:  According to issue: 1. Nausea, vomiting.  The patient was seen by Gastroenterology who did     eventually scope him again.  They recommended Carafate abstain from     all types of alcohol.  Continue Protonix b.i.d. and follow up with     them in about 3 weeks to 1 month.  The patient will need to do the     same. 2. Acute kidney injury.  The patient presented as he did similarly in     2010 with creatinine of 4, he was given IV fluids at 150 mL/hour     and he was slightly hypokalemic as well.  His creatinine resolved     well to a baseline of  about 2.51.  His creatinine will need to be     monitored as an outpatient and the patient would benefit from     having a CMP done in near future.  If this does not resolve the     patient will need been seeing by nephrologist. 3. Questionable chest pain.  The patient had point care markers that     were negative for chest pain and did not have any further issues.     It was thought that this was more likely to secondary esophagitis.     EKG on admission was significant for LVH, he had an axis of about     70 degrees.  No significant QT, T-wave changes from prior. 4. Diabetes mellitus.  The patient exhibited excellent diabetes     control in hospital with t.i.d. insulin.  We will place him back on     Lantus but at a lower dose 10 units and I have written a     specialized sliding scale as above.  His A1c in the hospital was     7.5 and he tells me that he has been trialed on oral medications in     the past certainly with creatinine of 2.5-2.6 he would be precluded     from any orals at this point in time. 5. Hepatitis C.  The patient had hepatitis C, RNA quant that  returned     as [id number] and the log was 6.41.  This will be followed up by Dr.     Michail Sermon.  I have spoken with Dr. Watt Climes who is aware of his GI     issues and hepatitis C issues and states that the patient should be     good to be discharged home and does not see any real elevation in     his LFTs or depression in his platelet count.  The patient will     need a surveillance ultrasound versus CT scan of the abdomen every     year. 6. Hypertension.  Blood pressure was moderately controlled in     hospital.  I have added Norvasc 10 mg to his atenolol 50 mg daily,     this will need to be followed as an outpatient. 7. Hyperlipidemia.  The patient will continue his simvastatin as prior     and will need to have a lipid panel done. 8. Esophagitis.  The patient was recommended to continue his Carafate,     continue his Protonix and to desist from drinking.  I have tried to     convince the patient that he may benefit from home health care, but     the patient adamantly refused. I have also try to being contact     with wife and with his family members and sister by the name Doneta     but I was not able to get in touch with them prior to discharge.  The patient on day of discharge was doing well.  He was sitting up in bed.  Temperature is 98.9, pulse was 80, respirations 18, blood pressure is 171/94 and 175/95, O2 sats 100% on room air.  He was tolerating p.o. fairly well and had a soft diet.  If the patient tolerates two more full meals he can be discharged home later on today.  Chest exam was negative.  He had no murmurs, rubs, or gallops.  He had mild pallor. There was no icterus.  Abdomen was  soft, nontender, nondistended.  Lower extremities were soft.  Neurologically he was grossly intact.          ______________________________ Verneita Griffes, MD     JS/MEDQ  D:  02/17/2011  T:  02/17/2011  Job:  [id number]  Electronically Signed by Verneita Griffes MD on 02/18/2011 03:52:07 PM

## 2011-02-18 NOTE — H&P (Signed)
Roger Vazquez, Roger Vazquez              ACCOUNT NO.:  [id number]  MEDICAL RECORD NO.:  [id number]           PATIENT TYPE:  I  LOCATION:  [id number]                         FACILITY:  [facility]  PHYSICIAN:  Verneita Griffes, MD        DATE OF BIRTH:  1951/04/20  DATE OF ADMISSION:  02/14/2011 DATE OF DISCHARGE:                             HISTORY & PHYSICAL   PRIMARY CARE PHYSICIAN:  Dr. Berkley Evonte in [place], [place]:  Verneita Griffes, MD  CHIEF COMPLAINT:  Nausea, vomiting.  This is a 60 year old male who presents with 2 days of nausea, vomiting. He states he has mainly been taking sips of fluids.  He denies any diarrhea.  The patient states he has abdominal pain in center of his abdomen which is a pressure-like pain which balls up.  He initially denied any radiation of the pain, but then states that he has sort of radiation up into his chest with a burning sort of sensation.  He has had this issue before in December and this feels similar to that.  The patient states in fact that he has this pain at baseline usually and has never really been completely relieved with this, but this got worse on Saturday last.  The patient's pain was intermittent.  The patient denies any diarrhea. The patient denies any food relationship with pain.  REVIEW OF SYSTEMS:  The patient states he has an appetite and wants to eat, but cannot because of the pain.  The patient denies any dark stools or tarry stools.  The patient states he does have some chest pain, but no pain shooting down his arm.  He has some shortness of breath.  The patient has not had stools in 2 days as he took Kaopectate for this.  The patient has no stroke-like symptoms, seizures, headaches, cough and cold.  He does have leg cramps, however.  The patient goes on to state that his pain in his abdomen does feel like a pressure.  PAST MEDICAL HISTORY:  Extensive and includes diabetes mellitus, hypertension, hepatitis C unclear if  ever treated and history of some form of cancer.  The patient also has a history of the erosive esophagitis seen by Dr. Michail Sermon of [facility] in the past.  The patient has a history of hypertension.  PAST SURGICAL HISTORY:  The patient still has his gallbladder and appendix and broke his leg in 2009.  ALLERGIES:  The patient has no known drug allergies.  FAMILY HISTORY:  The patient's father passed at the age of 3 from a heart attack.  The patient's mother had lung cancer and passed from a stroke in December 2011, she was 61 years old.  High blood pressure in some family.  SOCIAL HISTORY:  The patient worked at a [employer], contracting.  The patient smoked one-pack per week for the past 15-year.  The patient drinks on weekends half a pint of Moonshine.  The patient also claims to smoke one joint a day of marijuana.  LABORATORY DATA:  CMP showed sodium 133, potassium 3.3, chloride 90, CO2 29, glucose 280, BUN of 79, creatinine 4.44.  His baseline creatinine is 1.7, bilirubin 0.6, alk phos 106, SGOT 30, SGPT 22, total protein 7.8, albumin 3.4, lipase was 53, INR was 0.88 and PTT was 30.  CBC showed hemoglobin 12.3, hematocrit 35.5, platelet count 198 and WBCs 8.7. Cardiac markers showed myoglobin of about 500, troponin less than 0.05, CK-MB of 2.4.  The patient had chest x-ray portable that showed no active cardiopulmonary disease.  IMPRESSION/ASSESSMENT: 1. Nausea, vomiting.  This is more likely not related to his erosive     gastritis.  I will reimplement IV Pepcid 40 mg b.i.d. x2 days as     well as well as sucralfate 1 g by mouth 4 times a day. I will enlist help of Gastroenterology.  The patient has not had any further imaging studies and a chest x-ray.  However, I do not feel at this point in time that is a necessity as the patient likely will need a second scope as he does have a history of esophagitis which I am not sure if he followed up with Dr. Michail Sermon for. 1. Acute kidney  injury.  We will continue IV fluids at 150 mL per hour     and add potassium 20 mEq of this to the fluids.  Hopefully, his     creatinine trend down.  The patient states he does have a     nephrologist and we may need to enlist help with the patient's     creatinine does not turn the corner. I believe that this is likely prerenal azotemia secondary to poor p.o. intake. 1. Diabetes mellitus.  I will give some Lantus 6 units subcutaneously     stat and then q.p.m. at 600 and he will be placed on this moderate     sensitivity sliding scale. 2. Questionable chest pain.  It is unclear whether he has chest pain     secondary to esophagitis versus actual cardiogenic chest pain.  The     patient is a smoker and does have risk factors as such we will     cycle cardiac enzymes x3. Nursing is to inform MD. 1. Hepatitis C.  We will get up hepatitis RNA titers. 2. Substance abuse.  We will get a substance abuse counsellor.  I am     not sure if this is secondary to the patient's use of alcohol and     cigarettes, but more likely not this could be precipitance for his     esophagitis. 3. Hypertension.  I placed the patient on hydralazine 25 mg IV t.i.d.     x2 days. 4. Hyperlipidemia.  The patient was discharged last episode on     simvastatin.  We will hold off for now.  The patient will be reviewed daily.  I have explained the patient is a full code and will be admitted to [facility] hospitalist team #2.          ______________________________ Verneita Griffes, MD     JS/MEDQ  D:  02/14/2011  T:  02/14/2011  Job:  [id number]  Electronically Signed by Verneita Griffes MD on 02/18/2011 03:50:29 PM

## 2011-02-27 NOTE — Discharge Summary (Signed)
Roger Vazquez, FLAMENCO              ACCOUNT NO.:  [id number]   MEDICAL RECORD NO.:  [id number]          PATIENT TYPE:  INP   LOCATION:  [place]                         FACILITY:  [facility]   PHYSICIAN:  Rodney A. Mortenson, M.D.DATE OF BIRTH:  Jul 09, 1951   DATE OF ADMISSION:  01/26/2008  DATE OF DISCHARGE:  02/01/2008                               DISCHARGE SUMMARY   ADMISSION DIAGNOSIS:  Spiral fracture proximal fibula with spiral  fracture junction in the mid distal third tibia.   DISCHARGE DIAGNOSES:  1. Spiral fracture proximal fibula with spiral fracture junction in      the mid distal third tibia.  2. 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  3. Insulin-dependent diabetes mellitus.  4. Hypertension.  5. Hepatitis C.  6. Previous drug abuse.   PROCEDURE:  Intramedullary nail right tibia.  History of present illness  Roger Vazquez is a 60 year old African American male who was at work on the  day of his admission pulling on hose when he slipped on some water and  grease, twisting his right leg and fell injuring the lower leg and hip.  He was brought to [facility] emergency room where x-rays revealed  spiral fracture of the tib-fib.  At that time, he was admitted for  surgical intervention.   HOSPITAL COURSE:  A 60 year old male admitted January 26, 2008, after  appropriate laboratory studies were obtained as well as a CT scan of the  right hip to rule out a fracture.  It was felt that he was stable and  could undergo a intramedullary nailing of the hip.  He was then taken to  the operating room on the April 13 where he underwent an intramedullary  nail of the right tibia.  Preoperatively, he was noted to have decreased  function of the extensor hallucis longus consistent with the peroneal  nerve injury from his trauma secondary to the proximal fibular fracture.  He did undergo an intramedullary nail and tolerated the procedure well.  [facility] hospitalist was consulted for  evaluation and care.  He was  placed on Arixtra 2.5 mg subcu q. 8 a.m. beginning January 27, 2008.  Foot  pumps were also used.  A Dilaudid full-dose PCA pump was used.  Ortho  tech was consulted for a Cam walker to the right leg postoperatively.  The patient was seen by [place] and was placed with Lisinopril 10 mg  p.o. daily, Lantus 10 units subcu daily and a sensitive scale with a.c.  and h.s. coverage.  This was changed to moderate scale.  Lantus was also  changed to 15 units subcu daily.  The patient was allowed out of bed to  a chair the following day.  Blood cultures were ordered as well as urine  cultures for temperature elevation.  The patient did exhibit marked  swelling from the injury and did have multiple fracture blisters.  Also  had physical therapy started for gentle range of motion, active and  passive, as well as assisted active range of motion of the right ankle  and knee on the April 16.  Cam walker was  used only when sleeping.  On  the April 17, he was typed and crossed for 2 units of packed cells and  given 10 mg of Lasix between units.  We also consulted [place] for home  health.  He was taught Arixtra instructions.  Once he was ambulatory and  stable and medically cleared.  He was discharged on April 19 to return  back to the office in follow-up.   LABORATORY STUDIES:  Admitted with a hemoglobin 13.1, hematocrit 38.0%,  white count 6600, platelets 213,000.  His hemoglobin dropped to 7.9 on  April 17.  He was transfused 2 units of packed cells.  Discharge  hemoglobin 10.9, hematocrit 32.9%, white count 6200, platelets 166,000.  Preop sodium 142, potassium 4.0, chloride 109, CO2 26, glucose 128, BUN  14, creatinine 1.23.  He did have an abnormal electrolyte on April 18,  but obviously this was possible due to error because sodium was 153 and  K was 3.3 with a repeat 7 hours later with sodium 137, potassium 4.4.  His discharge sodium was 136, potassium 4.3, chloride  101, CO2 26,  glucose 240, BUN 20, creatinine 1.57.  Admission GFR was greater than  60.  On discharge, GFR was 46.  Preop total protein 7.1, albumin 3.3,  AST 55, ALT 40, ALP 112, bilirubin 0.8.  Discharge total protein 5.2,  albumin 2.1, AST 30, ALT 20, ALP 58 and total bilirubin 1.2.  A drug  screen of April 14 revealed positive for benzodiazepine and opiates.  Urine April 13 revealed small amount of hemoglobin, greater than 300  urobilinogen.  RBCs 0-2 and rare bacteria.  Blood type was O+, antibody  screen negative.  Two units were given during hospital course.  Those  were packed red cells.  Blood cultures x2 showed no growth after 5 days.  Urine culture showed no growth.  EKG on admission revealed normal sinus  rhythm with possible left atrial enlargement and left ventricular  hypertrophy, ST elevation is noted.  Consider early repolarization,  pericarditis, or injury.  T-wave abnormality and consider lateral  ischemia.  Radiographic studies fluoroscopy was used during the hospital  surgical procedure.  Chest x-ray on April 14 revealed no active disease.  Abdomen of April 14 revealed normal bowel gas pattern.   DISCHARGE INSTRUCTIONS:  He is to begin a low sodium heart healthy diet.  Ambulate as taught in physical therapy.  Keep incisions clean and dry  and change dressings daily.  Percocet 5/325 1-2 tabs every four as  needed for pain.  Arixtra 2.5 mg injected 8:00 a.m. as instructed.  He  is to continue lisinopril 10 mg daily, amlodipine 2.5 mg daily, Amaryl 2  mg daily and Levimir 15 units b.i.d.  His metformin and Glucotrol have  been discontinued according to the discharge note.  He is to follow up  with Dr. Alphonzo Cruise on April 27 or April 28, Dr. Shawna Orleans on April 27, at 11  a.m..   CONDITION ON DISCHARGE:  Improved.      Mike Craze Petrarca, P.A.-C.      Rodney A. Alphonzo Cruise, M.D.  Electronically Signed    BDP/MEDQ  D:  03/02/2008  T:  03/02/2008  Job:  [id number]

## 2011-02-27 NOTE — Op Note (Signed)
Roger Vazquez, Roger Vazquez              ACCOUNT NO.:  [id number]   MEDICAL RECORD NO.:  [id number]          PATIENT TYPE:  INP   LOCATION:  0102                         [facility]ACILITY:  [facility]   PHYSICIAN:  Rodney A. Mortenson, M.D.DATE OF BIRTH:  1951/07/20   DATE OF PROCEDURE:  01/26/2008  DATE OF DISCHARGE:                               OPERATIVE REPORT   SURGEON:  Barbaraann Rondo A. Alphonzo Cruise, MD.   Terrence DupontAaron Edelman D. Petrarca, PA-C.   PREOPERATIVE DIAGNOSIS:  Fracture, right distal tibia junction and at  the distal thirds, right.   POSTOPERATIVE DIAGNOSIS:  Fracture, right distal tibia junction and at  the distal thirds, right.   OPERATION:  Open reduction internal fixation using IM tibial nail with 2  proximal locking screws and 1 distal locking screw.   ANESTHESIA:  General.   PROCEDURE:  The patient was placed on the operative table in the supine  position.  The pneumatic tourniquet was brought to the right upper  thigh.  The entire right lower extremity was prepped with DuraPrep and  draped out in the usual manner.  The C-arm was used throughout.  The leg  was wrapped out with an Esmarch, and the tourniquet was elevated.   An incision was made over the anterior aspect of patella.  A long medial  parapatellar incision was then made, and the patella was everted  laterally.  The fat pads were slit posteriorly slightly, and the midline  of the tibia behind the patellar tendon was identified nicely.  Using  radiographic control. the guidepin was passed down the proximal portion  of the tibia.  Excellent position was seen in both the AP and lateral  views.  An opening hole reamer was then passed over the guidepin, and  this was buried down to the measuring line.  X-rays were taken showing  the excellent position of the initial drill hole.  The drill and  guidepin were then removed, and a guide nail was passed down the tibia  and the fracture reduced, and the guidepin was passed distally down  to  just above the ankle.  The X-ray showed excellent position and reduction  of the fracture.  Using flexible reamers, we passed over the guidepin,  and this was reamed out to an 11 diameter.  There was an excellent fit  in the isthmus at this level.  It was measured a 32-mm length and 10-mm  diameter pin.  The pin was placed on the impactor, and this was driven  down the tibial canal and across the fracture line and down to just  above the ankle.  An excellent, almost anatomic reduction was achieved.  Excellent stability was achieved.  Using the external guide proximally,  an oblique proximal screw was inserted, and then ___a_______  screw was  placed distally.  Excellent fixation was achieved proximally.  Attention  was then turned distally, and using a free-hand method, a stab wound was  made in the skin.  A drill hole was made through the distal hole in the  nail, and a screw was inserted, and this brought out the distal  nail  beautifully.  Excellent reduction was achieved as noted.  I was very  pleased with the construct and the stability of the fracture.   The parapatellar incision was then closed with Ethibond sutures.  Vicryl  was used to close the subcutaneous tissue, and stainless steel staples  were used to close the skin proximally and distally.  Sterile dressings  were applied and a Cam walker applied, and the patient returned to the  recovery room in excellent condition.  Technically, this went extremely  well.   DRAINS:  None.   COMPLICATIONS:  None.  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      Rodney A. Alphonzo Cruise, M.D.  Electronically Signed     RAM/MEDQ  D:  01/26/2008  T:  01/26/2008  Job:  [id number]   cc:   Mike Craze. Petrarca, P.A.-C.

## 2011-03-02 NOTE — Assessment & Plan Note (Signed)
[facility]                             PRIMARY CARE OFFICE NOTE   NAME:Roger Vazquez, Roger Vazquez                     MRN:          [id number]  DATE:07/15/2006                            DOB:          1951-05-31    CHIEF COMPLAINT:  New patient to practice.   HISTORY OF PRESENT ILLNESS:  The patient is a 60 year old, African-American  male who had seen Dr. Phoebe Sharps in the past in 1997.  The patient has a  history of type 2 diabetes and also alcohol abuse.  He lived in the  [place] area but has recently returned to [place].  The patient has had  recent Urgent Care visits secondary to multiple boils in his skin.  The  patient was diagnosed with probable MRSA and is currently finishing a course  of doxycycline.   He notes that he still has an area of induration and tenderness of his left  thigh posteriorly.  Has had no fevers.   He admits to a history of drug abuse.  He has been abstaining from heroine  use and smoking crack cocaine for the last 4 years.  He states that he has  been checked for HIV in the past.  His most recent test was approximately 2  months ago.   During the last 1 or 2 months, the patient has noticed right hand has  swollen and become puffy.  He has difficulty completely closing his right  hand and complains of a chronic achy sensation.   He denies any trauma to the hand.  He notes some mild discomfort of his left  hand but much greater than on the right side.   No other joint pains including elbows, shoulders, and knees.  Denies any  history of recent fevers and only infection has been recent possible MRSA  skin infections.   PAST MEDICAL HISTORY SUMMARY:  1. Type 2 diabetes for the last 15 years, unknown status.  2. History of alcoholism, heroine use, and crack cocaine use.  3. Erectile dysfunction.   CURRENT MEDICATIONS:  1. Glipizide 10 mg once a day.  2. Cialis 10 mg.  He has not taken this in some time.  3.  Doxycycline 100 mg b.i.d. which he has recently finished.   ALLERGIES TO MEDICATIONS:  None known.   SOCIAL HISTORY:  The patient is divorced, currently lives alone.  Does not  have any children. Works at a [employer] ([employer]).  He used to  make lipstick/wax.   FAMILY HISTORY:  Mother alive at age 14, has a history of lung cancer.  Father deceased at age 51 secondary to myocardial infarction/coronary artery  disease.  Denies any family history of colon cancer.   HABITS:  He quit tobacco in March 2004.  He has smoked since the age 32, one  pack per day.   REVIEW OF SYSTEMS:  As noted above.  Denies any hypoglycemia.  He does not  check his blood sugar on a regular basis.  Denies any chest pain, shortness  of breath.  All other systems negative.   PHYSICAL EXAM:  VITALS:  Weight is 147 pounds.  Temperature is 98.0.  Pulse  is 72.  BP is 114/75.  GENERAL:  The patient is a pleasant, well-developed, well-nourished, 33-year-  old, African-American male in no apparent distress.  HEENT:  Normocephalic, atraumatic.  Pupils are equal and reactive to light  bilaterally.  Extraocular motility was intact.  The patient was anicteric.  Conjunctivae was within normal limits.  There was no note of conjunctival  petechia or petechia of his oropharynx.  External auditory canals were  inspected.  Membranes were clear bilaterally.  NECK:  Supple.  No adenopathy, carotid bruits, or thyromegaly.  CHEST:  Normal inspiratory effort.  Chest was clear to auscultation  bilaterally and no rhonchi, rubs, or wheezing.  CARDIOVASCULAR:  Regular rate and rhythm.  No significant murmurs, rubs, or  gallops appreciated.  ABDOMEN:  Soft, nontender.  Positive bowel sounds.  No organomegaly.  MUSCULOSKELETAL:  Examination of his right hand revealed edema with some  boggy synovium over his metacarpal joints.  There was some mild tenderness  to palpation.  Otherwise, no other joint effusions were noted.   SKIN:  Revealed he has a healed boil posterior aspect of his left thigh.  No  significant edema or induration.  Did have some scaliness to his skin.  NEUROLOGIC:  Cranial nerves II through XII were grossly intact.  He was  nonfocal.   IMPRESSION/RECOMMENDATIONS:  1. Right hand pain/edema.  2. Type 2 diabetes, unknown status.  3. History of intravenous drug use and also crack cocaine use.  4. History of alcohol abuse.  5. History of erectile dysfunction.  6. Health maintenance.   RECOMMENDATIONS:  In regards to his right hand symptoms, he will be sent for  a hand x-ray.  We will repeat his HIV ELISA as well as check hepatitis B and  C serologies and check a sed rate along with an ANA and rheumatoid factor.  The patient may benefit from evaluation from rheumatologist.   In terms of his type 2 diabetes, we discussed the need to not only treat his  sugars but his overall cardiovascular risks, and he will be sent for a  hemoglobin A1c, microalbumin/creatinine ratio and also a fasting lipid  profile.  I refilled his glipizide today but conveyed to the patient that he  will likely need additional medications to control his diabetes.   Followup time is in approximately 1 to 2 weeks.       Sandy Salaam. Shawna Orleans, DO      RDY/MedQ  DD:  07/17/2006  DT:  07/19/2006  Job #:  [id number]

## 2011-03-15 NOTE — Op Note (Signed)
  Roger Vazquez, Roger Vazquez              ACCOUNT NO.:  [id number]  MEDICAL RECORD NO.:  [id number]           PATIENT TYPE:  LOCATION:                                 FACILITY:  PHYSICIAN:  Lear Ng, MDDATE OF BIRTH:  1951-07-02  DATE OF PROCEDURE: DATE OF DISCHARGE:                              OPERATIVE REPORT   INDICATIONS:  Abdominal pain, nausea and vomiting.  MEDICATIONS: 1. Fentanyl 70 mcg IV. 2. Versed 10 mg IV. 3. Cetacaine spray x2. 4. Benadryl 25 mg IV.  FINDINGS:  Endoscope was inserted into the oropharynx and esophagus was intubated which revealed circumferential ulceration and white exudate starting at 26 cm to 40 cm from the incisors.  This was consistent with severe erosive esophagitis.  A cytologic brushing was taken of this inflamed mucosa to check the yeast.  Endoscope was advanced down into the stomach which revealed normal-appearing gastric mucosa. Retroflexion was done which revealed normal proximal stomach.  The endoscope was straightened and advanced into the duodenal bulb and second portion of the duodenum which were unremarkable.  Endoscope was withdrawn, and on withdrawal of the previously stated cytologic brushing was taken which revealed minimal bleeding from the procedure.  Endoscope was withdrawn to confirm the above findings.  ASSESSMENT:  Severe erosive esophagitis affecting the distal two-thirds of the esophagus.  Status post cytologic brushing to check for yeast.  PLAN: 1. Change to PPI therapy q.12 h. 2. Continue Carafate. 3. Clear-liquid diet and advance as tolerated. 4. Followup on brushing for yeast and treat if positive. 5. The patient needs to avoid all alcohol products.     Lear Ng, MD     VCS/MEDQ  D:  02/15/2011  T:  02/16/2011  Job:  [id number]  Electronically Signed by Wilford Corner MD on 03/15/2011 10:28:17 PM

## 2011-04-30 NOTE — Consult Note (Signed)
  NAMEJAGER, LOCKETT              ACCOUNT NO.:  [id number]  MEDICAL RECORD NO.:  [id number]           PATIENT TYPE:  I  LOCATION:  [id number]                         FACILITY:  [facility]  PHYSICIAN:  Arta Silence, MD     DATE OF BIRTH:  03-10-1951  DATE OF CONSULTATION:  02/14/2011 DATE OF DISCHARGE:                                CONSULTATION   REASON FOR CONSULTATION:  Nausea, vomiting, and chest pain.  CONSULTING PHYSICIAN:  [facility].  HISTORY OF PRESENT ILLNESS:  Mr. Rhew is a 60 year old gentleman presenting for evaluation of nausea and vomiting.  He is in a static state of health until Sunday.  At that time, he began having profuse nausea, vomiting, and weakness.  He does endorse drinking a fair bit of alcohol during this time frame.  He had a similar presentation in December 2010 and ultimately had an endoscopy that showed severe esophagitis.  Past medical history, past surgical history, family history, social history, medications, allergies, review of systems; all from dictated note from Dr. Verlon Au dated Feb 14, 2011.  I have reviewed and I agree.  PHYSICAL EXAMINATION:  VITAL SIGNS:  Blood pressure 172/90, heart rate 77, respiratory rate 18, temperature 98.5, oxygen saturation 100% on room air. GENERAL:  Mr. Berne is in no acute distress, nontoxic-appearing. HEENT:  Mucous membranes are dry.  No oropharyngeal lesions.  Eyes: Sclerae anicteric.  Conjunctivae are pink. NECK:  Supple. LUNGS:  Clear. HEART:  Regular. ABDOMEN:  Soft, nontender, nondistended.  Normoactive bowel sounds.  No liver or splenic enlargement. EXTREMITIES:  No peripheral cyanosis, clubbing, or edema. NEUROLOGIC:  Diffusely weak, but nonfocal without lateralizing signs.  LABORATORY DATA:  Hemoglobin is 12.9, white count 8.7, platelet count 198.  INR is 0.88.  Sodium 135, potassium 3.3, chloride 100, bicarb 29, BUN 79, creatinine 4.9, glucose 278.  Lipase 53.  Total protein 7.8, albumin  3.4, total bilirubin 0.6, alk phos 106, AST 30, ALT 22. Urinalysis shows normal urine specific gravity.  RADIOLOGIC STUDIES:  Chest x-ray, no acute cardiopulmonary disease.  IMPRESSION:  Mr. Dinkel is a 60 year old gentleman who is presenting with a lot of nausea, vomiting, dehydration, and chest pain.  He likely has esophagitis again.  I questioned whether he might have some underlying gastroparesis, which could in turn lead to severe reflux esophagitis.  PLAN: 1. Agree with supportive management with aggressive hydration, proton     pump inhibitor therapy, and Carafate. 2. We will need an endoscopy, likely late tomorrow afternoon or early     Friday morning, to give him adequate time to help resolve his acute     renal failure. 3. He will likely ultimately benefit from a gastric emptying study at     some point as well.     Arta Silence, MD     WO/MEDQ  D:  02/14/2011  T:  02/15/2011  Job:  [id number]  Electronically Signed by Arta Silence  on 04/30/2011 01:14:01 PM

## 2011-07-10 LAB — CULTURE, BLOOD (ROUTINE X 2)

## 2011-07-10 LAB — ABO/RH: ABO/RH(D): O POS

## 2011-07-10 LAB — CBC
HCT: 38 — ABNORMAL LOW
HCT: 28.2 — ABNORMAL LOW
Hemoglobin: 13.1
Hemoglobin: 10.9 — ABNORMAL LOW
Hemoglobin: 9.6 — ABNORMAL LOW
MCHC: 34.5
MCHC: 33.1
MCHC: 34.4
MCHC: 34.6
Platelets: 191
Platelets: 213
Platelets: 156
Platelets: 168
RBC: 2.59 — ABNORMAL LOW
RBC: 2.94 — ABNORMAL LOW
RBC: 3.01 — ABNORMAL LOW
RBC: 3.53 — ABNORMAL LOW
RDW: 13.2
RDW: 13.7
RDW: 13.7
RDW: 13.7
RDW: 13.7
WBC: 7.6

## 2011-07-10 LAB — COMPREHENSIVE METABOLIC PANEL
ALT: 20
ALT: 20
ALT: 22
AST: 50 — ABNORMAL HIGH
AST: 38 — ABNORMAL HIGH
Albumin: 2.7 — ABNORMAL LOW
Albumin: 2.1 — ABNORMAL LOW
Albumin: 2.2 — ABNORMAL LOW
Alkaline Phosphatase: 96
Alkaline Phosphatase: 58
Alkaline Phosphatase: 71
BUN: 14
BUN: 15
BUN: 16
CO2: 18 — ABNORMAL LOW
CO2: 25
Calcium: 8.8
Calcium: 7.9 — ABNORMAL LOW
Chloride: 105
Chloride: 106
Chloride: 88 — ABNORMAL LOW
Creatinine, Ser: 1.52 — ABNORMAL HIGH
GFR calc Af Amer: 57 — ABNORMAL LOW
GFR calc Af Amer: 48 — ABNORMAL LOW
GFR calc non Af Amer: 40 — ABNORMAL LOW
GFR calc non Af Amer: 56 — ABNORMAL LOW
Glucose, Bld: 128 — ABNORMAL HIGH
Glucose, Bld: 155 — ABNORMAL HIGH
Potassium: 3.8
Potassium: 3.3 — ABNORMAL LOW
Potassium: 3.6
Potassium: 3.7
Sodium: 142
Sodium: 133 — ABNORMAL LOW
Sodium: 136
Sodium: 153 — ABNORMAL HIGH
Total Bilirubin: 0.9
Total Bilirubin: 0.7
Total Bilirubin: 1.2
Total Protein: 7.1
Total Protein: 5.2 — ABNORMAL LOW
Total Protein: 5.4 — ABNORMAL LOW

## 2011-07-10 LAB — URINALYSIS, ROUTINE W REFLEX MICROSCOPIC
Glucose, UA: NEGATIVE
Specific Gravity, Urine: 1.011
Urobilinogen, UA: 1
pH: 7

## 2011-07-10 LAB — CROSSMATCH
ABO/RH(D): O POS
Antibody Screen: NEGATIVE

## 2011-07-10 LAB — RAPID URINE DRUG SCREEN, HOSP PERFORMED
Barbiturates: NOT DETECTED
Cocaine: NOT DETECTED
Opiates: POSITIVE — AB

## 2011-07-10 LAB — BASIC METABOLIC PANEL
BUN: 13
BUN: 15
CO2: 23
CO2: 26
CO2: 30
Calcium: 8.3 — ABNORMAL LOW
Calcium: 7.6 — ABNORMAL LOW
Calcium: 8.2 — ABNORMAL LOW
Calcium: 8.5
Creatinine, Ser: 1.49
Creatinine, Ser: 1.56 — ABNORMAL HIGH
GFR calc Af Amer: 55 — ABNORMAL LOW
GFR calc Af Amer: 56 — ABNORMAL LOW
GFR calc Af Amer: 59 — ABNORMAL LOW
GFR calc non Af Amer: 52 — ABNORMAL LOW
GFR calc non Af Amer: 46 — ABNORMAL LOW
GFR calc non Af Amer: 48 — ABNORMAL LOW
Glucose, Bld: 101 — ABNORMAL HIGH
Glucose, Bld: 230 — ABNORMAL HIGH
Potassium: 4.3
Sodium: 136
Sodium: 137

## 2011-07-10 LAB — APTT: aPTT: 24

## 2011-07-10 LAB — DIFFERENTIAL
Lymphs Abs: 1.6
Monocytes Relative: 6
Neutro Abs: 4.5
Neutrophils Relative %: 68

## 2011-07-10 LAB — URINE MICROSCOPIC-ADD ON

## 2011-07-10 LAB — TYPE AND SCREEN: Antibody Screen: NEGATIVE

## 2011-07-10 LAB — URINE CULTURE: Colony Count: NO GROWTH

## 2011-07-10 LAB — PROTIME-INR: INR: 0.9

## 2011-10-07 ENCOUNTER — Ambulatory Visit (INDEPENDENT_AMBULATORY_CARE_PROVIDER_SITE_OTHER): Payer: Medicare Other

## 2011-10-07 ENCOUNTER — Inpatient Hospital Stay (HOSPITAL_COMMUNITY)
Admission: EM | Admit: 2011-10-07 | Discharge: 2011-10-12 | DRG: 392 | Disposition: A | Discharge status: Home / Self Care | Payer: Medicare Other | Attending: Internal Medicine | Admitting: Internal Medicine

## 2011-10-07 ENCOUNTER — Encounter: Payer: Self-pay | Admitting: *Deleted

## 2011-10-07 ENCOUNTER — Inpatient Hospital Stay (HOSPITAL_COMMUNITY): Payer: Medicare Other

## 2011-10-07 DIAGNOSIS — Z794 Long term (current) use of insulin: Secondary | ICD-10-CM

## 2011-10-07 DIAGNOSIS — B1920 Unspecified viral hepatitis C without hepatic coma: Secondary | ICD-10-CM | POA: Diagnosis present

## 2011-10-07 DIAGNOSIS — N179 Acute kidney failure, unspecified: Secondary | ICD-10-CM | POA: Diagnosis present

## 2011-10-07 DIAGNOSIS — Z79899 Other long term (current) drug therapy: Secondary | ICD-10-CM

## 2011-10-07 DIAGNOSIS — K208 Other esophagitis without bleeding: Principal | ICD-10-CM | POA: Diagnosis present

## 2011-10-07 DIAGNOSIS — B1710 Acute hepatitis C without hepatic coma: Secondary | ICD-10-CM | POA: Diagnosis present

## 2011-10-07 DIAGNOSIS — I10 Essential (primary) hypertension: Secondary | ICD-10-CM | POA: Diagnosis present

## 2011-10-07 DIAGNOSIS — E876 Hypokalemia: Secondary | ICD-10-CM | POA: Diagnosis present

## 2011-10-07 DIAGNOSIS — F1411 Cocaine abuse, in remission: Secondary | ICD-10-CM | POA: Diagnosis present

## 2011-10-07 DIAGNOSIS — N183 Chronic kidney disease, stage 3 unspecified: Secondary | ICD-10-CM | POA: Diagnosis present

## 2011-10-07 DIAGNOSIS — I129 Hypertensive chronic kidney disease with stage 1 through stage 4 chronic kidney disease, or unspecified chronic kidney disease: Secondary | ICD-10-CM | POA: Diagnosis present

## 2011-10-07 DIAGNOSIS — E1122 Type 2 diabetes mellitus with diabetic chronic kidney disease: Secondary | ICD-10-CM | POA: Diagnosis present

## 2011-10-07 DIAGNOSIS — F1020 Alcohol dependence, uncomplicated: Secondary | ICD-10-CM

## 2011-10-07 DIAGNOSIS — R739 Hyperglycemia, unspecified: Secondary | ICD-10-CM

## 2011-10-07 DIAGNOSIS — R1110 Vomiting, unspecified: Secondary | ICD-10-CM

## 2011-10-07 DIAGNOSIS — F528 Other sexual dysfunction not due to a substance or known physiological condition: Secondary | ICD-10-CM

## 2011-10-07 DIAGNOSIS — E559 Vitamin D deficiency, unspecified: Secondary | ICD-10-CM

## 2011-10-07 DIAGNOSIS — E860 Dehydration: Secondary | ICD-10-CM

## 2011-10-07 DIAGNOSIS — Z87891 Personal history of nicotine dependence: Secondary | ICD-10-CM

## 2011-10-07 DIAGNOSIS — E119 Type 2 diabetes mellitus without complications: Secondary | ICD-10-CM

## 2011-10-07 DIAGNOSIS — F1011 Alcohol abuse, in remission: Secondary | ICD-10-CM | POA: Diagnosis present

## 2011-10-07 DIAGNOSIS — R109 Unspecified abdominal pain: Secondary | ICD-10-CM | POA: Diagnosis present

## 2011-10-07 DIAGNOSIS — Z7982 Long term (current) use of aspirin: Secondary | ICD-10-CM

## 2011-10-07 DIAGNOSIS — R112 Nausea with vomiting, unspecified: Secondary | ICD-10-CM

## 2011-10-07 DIAGNOSIS — IMO0002 Reserved for concepts with insufficient information to code with codable children: Secondary | ICD-10-CM

## 2011-10-07 DIAGNOSIS — E1169 Type 2 diabetes mellitus with other specified complication: Secondary | ICD-10-CM | POA: Diagnosis not present

## 2011-10-07 DIAGNOSIS — N289 Disorder of kidney and ureter, unspecified: Secondary | ICD-10-CM

## 2011-10-07 DIAGNOSIS — F1111 Opioid abuse, in remission: Secondary | ICD-10-CM | POA: Diagnosis present

## 2011-10-07 HISTORY — DX: Essential (primary) hypertension: I10

## 2011-10-07 LAB — URINE MICROSCOPIC-ADD ON

## 2011-10-07 LAB — POCT I-STAT TROPONIN I: Troponin i, poc: 0.09 ng/mL (ref 0.00–0.08)

## 2011-10-07 LAB — URINALYSIS, ROUTINE W REFLEX MICROSCOPIC
Bilirubin Urine: NEGATIVE
Ketones, ur: NEGATIVE mg/dL
Protein, ur: >300 mg/dL — AB
Urobilinogen, UA: 0.2 mg/dL (ref 0.0–1.0)

## 2011-10-07 LAB — PROTIME-INR: Prothrombin Time: 13.1 seconds (ref 11.6–15.2)

## 2011-10-07 LAB — CARDIAC PANEL(CRET KIN+CKTOT+MB+TROPI)
Relative Index: 1.5 (ref 0.0–2.5)
Total CK: 455 U/L — ABNORMAL HIGH (ref 7–232)
Total CK: 466 U/L — ABNORMAL HIGH (ref 7–232)

## 2011-10-07 LAB — MAGNESIUM: Magnesium: 3 mg/dL — ABNORMAL HIGH (ref 1.5–2.5)

## 2011-10-07 LAB — COMPREHENSIVE METABOLIC PANEL
ALT: 19 U/L (ref 0–53)
AST: 30 U/L (ref 0–37)
CO2: 30 mEq/L (ref 19–32)
Calcium: 9.2 mg/dL (ref 8.4–10.5)
Chloride: 99 mEq/L (ref 96–112)
GFR calc non Af Amer: 16 mL/min — ABNORMAL LOW (ref >90)
Sodium: 144 mEq/L (ref 135–145)

## 2011-10-07 LAB — BASIC METABOLIC PANEL
BUN: 53 mg/dL — ABNORMAL HIGH (ref 6–23)
Chloride: 99 mEq/L (ref 96–112)
Creatinine, Ser: 3.71 mg/dL — ABNORMAL HIGH (ref 0.50–1.35)
Glucose, Bld: 392 mg/dL — ABNORMAL HIGH (ref 70–99)
Potassium: 3.6 mEq/L (ref 3.5–5.1)

## 2011-10-07 LAB — DIFFERENTIAL
Lymphs Abs: 0.7 10*3/uL (ref 0.7–4.0)
Monocytes Absolute: 1.3 10*3/uL — ABNORMAL HIGH (ref 0.1–1.0)
Monocytes Relative: 9 % (ref 3–12)
Neutro Abs: 12.6 10*3/uL — ABNORMAL HIGH (ref 1.7–7.7)
Neutrophils Relative %: 86 % — ABNORMAL HIGH (ref 43–77)

## 2011-10-07 LAB — RAPID URINE DRUG SCREEN, HOSP PERFORMED
Barbiturates: NOT DETECTED
Cocaine: NOT DETECTED
Tetrahydrocannabinol: POSITIVE — AB

## 2011-10-07 LAB — LACTIC ACID, PLASMA: Lactic Acid, Venous: 2.1 mmol/L (ref 0.5–2.2)

## 2011-10-07 LAB — TSH: TSH: 0.692 u[IU]/mL (ref 0.350–4.500)

## 2011-10-07 LAB — CBC
HCT: 35.8 % — ABNORMAL LOW (ref 39.0–52.0)
Hemoglobin: 12.4 g/dL — ABNORMAL LOW (ref 13.0–17.0)
MCH: 33.6 pg (ref 26.0–34.0)
RBC: 3.69 MIL/uL — ABNORMAL LOW (ref 4.22–5.81)

## 2011-10-07 LAB — GLUCOSE, CAPILLARY
Glucose-Capillary: 179 mg/dL — ABNORMAL HIGH (ref 70–99)
Glucose-Capillary: 122 mg/dL — ABNORMAL HIGH (ref 70–99)

## 2011-10-07 MED ORDER — VITAMIN B-1 100 MG PO TABS
100.0000 mg | ORAL_TABLET | Freq: Every day | ORAL | Status: DC
  Administered 2011-10-08 – 2011-10-12 (×4): 100 mg via ORAL
  Filled 2011-10-07 (×5): qty 1

## 2011-10-07 MED ORDER — ENOXAPARIN SODIUM 40 MG/0.4ML ~~LOC~~ SOLN: 40.0000 mg | SUBCUTANEOUS | Status: DC

## 2011-10-07 MED ORDER — MORPHINE SULFATE 2 MG/ML IJ SOLN: 1.0000 mg | INTRAMUSCULAR | Status: DC | PRN

## 2011-10-07 MED ORDER — ACETAMINOPHEN 325 MG PO TABS: 650.0000 mg | ORAL_TABLET | Freq: Four times a day (QID) | ORAL | Status: DC | PRN

## 2011-10-07 MED ORDER — THIAMINE HCL 100 MG/ML IJ SOLN
INTRAVENOUS | Status: DC
  Filled 2011-10-07: qty 1000

## 2011-10-07 MED ORDER — ENOXAPARIN SODIUM 30 MG/0.3ML ~~LOC~~ SOLN
30.0000 mg | SUBCUTANEOUS | Status: DC
  Administered 2011-10-07 – 2011-10-11 (×5): 30 mg via SUBCUTANEOUS
  Filled 2011-10-07 (×6): qty 0.3

## 2011-10-07 MED ORDER — PANTOPRAZOLE SODIUM 40 MG IV SOLR
40.0000 mg | Freq: Once | INTRAVENOUS | Status: AC
  Administered 2011-10-07: 40 mg via INTRAVENOUS
  Filled 2011-10-07: qty 40

## 2011-10-07 MED ORDER — LORAZEPAM 2 MG/ML IJ SOLN
1.0000 mg | Freq: Once | INTRAMUSCULAR | Status: AC
  Administered 2011-10-07: 1 mg via INTRAVENOUS
  Filled 2011-10-07: qty 1

## 2011-10-07 MED ORDER — INSULIN ASPART 100 UNIT/ML ~~LOC~~ SOLN
7.0000 [IU] | Freq: Once | SUBCUTANEOUS | Status: AC
  Administered 2011-10-07: 7 [IU] via INTRAVENOUS
  Filled 2011-10-07: qty 1

## 2011-10-07 MED ORDER — SODIUM CHLORIDE 0.9 % IV BOLUS (SEPSIS)
1000.0000 mL | Freq: Once | INTRAVENOUS | Status: AC
  Administered 2011-10-07: 1000 mL via INTRAVENOUS

## 2011-10-07 MED ORDER — INSULIN ASPART 100 UNIT/ML ~~LOC~~ SOLN
0.0000 [IU] | Freq: Three times a day (TID) | SUBCUTANEOUS | Status: DC
  Administered 2011-10-07: 4 [IU] via SUBCUTANEOUS
  Filled 2011-10-07: qty 3

## 2011-10-07 MED ORDER — ALUM & MAG HYDROXIDE-SIMETH 200-200-20 MG/5ML PO SUSP
30.0000 mL | Freq: Four times a day (QID) | ORAL | Status: DC | PRN
  Administered 2011-10-09: 30 mL via ORAL
  Filled 2011-10-07: qty 30

## 2011-10-07 MED ORDER — HYDRALAZINE HCL 20 MG/ML IJ SOLN
10.0000 mg | Freq: Four times a day (QID) | INTRAMUSCULAR | Status: DC | PRN
  Administered 2011-10-10 – 2011-10-11 (×4): 10 mg via INTRAVENOUS
  Filled 2011-10-07 (×4): qty 1

## 2011-10-07 MED ORDER — ONDANSETRON HCL 4 MG PO TABS
4.0000 mg | ORAL_TABLET | Freq: Four times a day (QID) | ORAL | Status: DC | PRN
  Administered 2011-10-09: 4 mg via ORAL
  Filled 2011-10-07: qty 1

## 2011-10-07 MED ORDER — ONDANSETRON HCL 4 MG/2ML IJ SOLN
4.0000 mg | Freq: Once | INTRAMUSCULAR | Status: AC
  Administered 2011-10-07: 4 mg via INTRAVENOUS
  Filled 2011-10-07: qty 2

## 2011-10-07 MED ORDER — GI COCKTAIL ~~LOC~~
30.0000 mL | Freq: Once | ORAL | Status: AC
  Administered 2011-10-07: 30 mL via ORAL
  Filled 2011-10-07: qty 30

## 2011-10-07 MED ORDER — ONDANSETRON HCL 4 MG/2ML IJ SOLN
4.0000 mg | Freq: Four times a day (QID) | INTRAMUSCULAR | Status: DC | PRN
  Administered 2011-10-10 – 2011-10-11 (×2): 4 mg via INTRAVENOUS
  Filled 2011-10-07 (×2): qty 2

## 2011-10-07 MED ORDER — INSULIN GLARGINE 100 UNIT/ML ~~LOC~~ SOLN
30.0000 [IU] | Freq: Every day | SUBCUTANEOUS | Status: DC
  Administered 2011-10-07 – 2011-10-08 (×2): 30 [IU] via SUBCUTANEOUS
  Filled 2011-10-07: qty 3

## 2011-10-07 MED ORDER — INSULIN ASPART 100 UNIT/ML ~~LOC~~ SOLN: 0.0000 [IU] | Freq: Every day | SUBCUTANEOUS | Status: DC

## 2011-10-07 MED ORDER — M.V.I. ADULT IV INJ
INJECTION | Freq: Once | INTRAVENOUS | Status: AC
  Administered 2011-10-07: 12:00:00 via INTRAVENOUS
  Filled 2011-10-07: qty 1000

## 2011-10-07 MED ORDER — ACETAMINOPHEN 650 MG RE SUPP: 650.0000 mg | Freq: Four times a day (QID) | RECTAL | Status: DC | PRN

## 2011-10-07 MED ORDER — SODIUM CHLORIDE 0.9 % IV SOLN
INTRAVENOUS | Status: DC
  Filled 2011-10-07: qty 1

## 2011-10-07 MED ORDER — MORPHINE SULFATE 4 MG/ML IJ SOLN
4.0000 mg | Freq: Once | INTRAMUSCULAR | Status: AC
  Administered 2011-10-07: 4 mg via INTRAVENOUS
  Filled 2011-10-07: qty 1

## 2011-10-07 MED ORDER — AMLODIPINE BESYLATE 10 MG PO TABS
10.0000 mg | ORAL_TABLET | Freq: Every day | ORAL | Status: DC
  Administered 2011-10-07 – 2011-10-12 (×6): 10 mg via ORAL
  Filled 2011-10-07 (×6): qty 1

## 2011-10-07 MED ORDER — ALBUTEROL SULFATE (5 MG/ML) 0.5% IN NEBU
2.5000 mg | INHALATION_SOLUTION | Freq: Four times a day (QID) | RESPIRATORY_TRACT | Status: DC
  Administered 2011-10-07: 2.5 mg via RESPIRATORY_TRACT
  Filled 2011-10-07: qty 0.5

## 2011-10-07 MED ORDER — PANTOPRAZOLE SODIUM 40 MG IV SOLR
40.0000 mg | Freq: Two times a day (BID) | INTRAVENOUS | Status: DC
  Administered 2011-10-07 – 2011-10-12 (×10): 40 mg via INTRAVENOUS
  Filled 2011-10-07 (×12): qty 40

## 2011-10-07 NOTE — ED Notes (Signed)
Pt c/o N/V x 2 days, reports vomiting 5-10 daily since Friday, pt. Cannot state whether or not he has noticed blood or dark colored vomitus

## 2011-10-07 NOTE — ED Provider Notes (Signed)
History     CSN: [id number]  Arrival date & time 10/07/11  1000   First MD Initiated Contact with Patient 10/07/11 1013      Chief Complaint  Patient presents with  . Emesis    (Consider location/radiation/quality/duration/timing/severity/associated sxs/prior treatment) Patient is a 60 y.o. male presenting with vomiting. The history is provided by the patient, the spouse and a relative.  Emesis  Associated symptoms include abdominal pain. Pertinent negatives include no chills, no cough, no diarrhea, no fever and no headaches.   the patient is a 60 year old male, who smokes and drinks alcohol and has a history of hypertension.  He presents to the emergency department complaining of epigastric abdominal pain with nausea and vomiting.  For the past 3 days.  He denies a history of peptic ulcer disease, or pancreatitis or abdominal surgery in the past.  He denies diarrhea.  He denies cough, shortness of breath.  He has been drinking alcohol recently.  [facility]  [facility] History  Diagnosis Date  . Hypertension     History reviewed. No pertinent past surgical history.  History reviewed. No pertinent family history.  History  Substance Use Topics  . Smoking status: Former Smoker -- 0.5 packs/day  . Smokeless tobacco: Not on file  . Alcohol Use: 5.4 oz/week    9 Cans of beer per week      Review of Systems  Constitutional: Negative for fever, chills and diaphoresis.  HENT: Negative for neck pain.   Eyes: Negative for redness.  Respiratory: Negative for cough, chest tightness and shortness of breath.   Cardiovascular: Negative for chest pain and palpitations.  Gastrointestinal: Positive for nausea, vomiting and abdominal pain. Negative for diarrhea and blood in stool.  Musculoskeletal: Negative for back pain.  Skin: Negative for rash.  Neurological: Negative for weakness and headaches.  Psychiatric/Behavioral: Negative for confusion.     Allergies  Review of patient's allergies indicates no known allergies.  Home Medications  No current outpatient prescriptions on file.  BP 200/100  Pulse 116  Temp(Src) 99.1 F (37.3 C) (Oral)  Resp 16  SpO2 100%  Physical Exam  Vitals reviewed. Constitutional: He is oriented to person, place, and time. He appears well-developed and well-nourished. No distress.  HENT:  Head: Normocephalic and atraumatic.  Eyes: EOM are normal. Pupils are equal, round, and reactive to light.  Neck: Normal range of motion. Neck supple.  Cardiovascular: Regular rhythm and normal heart sounds.   No murmur heard.      Tachycardia  Pulmonary/Chest: Effort normal and breath sounds normal. No respiratory distress. He has no wheezes. He has no rales.  Abdominal: Soft. Bowel sounds are normal. He exhibits no distension and no mass. There is tenderness. There is no rebound and no guarding.       Mild epigastric tenderness, with no peritoneal signs  Musculoskeletal: Normal range of motion. He exhibits no edema and no tenderness.  Neurological: He is alert and oriented to person, place, and time. No cranial nerve deficit.  Skin: Skin is warm and dry. He is not diaphoretic.  Psychiatric: He has a normal mood and affect. His behavior is normal.    ED Course  Procedures (including critical care time) 60 year old male, with hypertension, and probable alcoholism, presents with three-day history of abdominal pain, and vomiting in association with drinking alcohol.  He is tachycardic, with mild epigastric tenderness.  We will treat him with IV fluids, analgesics, and that since and  antiemetics.  No perform laboratory testing, to determine if he is anemic.  Has no electrolyte abnormality from the persistent vomiting, or there is pancreatitis.  At this point.  There is no indication for CAT scan of his abdomen   Labs Reviewed  CBC  COMPREHENSIVE METABOLIC PANEL  LIPASE, BLOOD  ETHANOL  I-STAT TROPONIN I    No results found.   2:14 PM Spoke with Dr. Blenda Nicely. He will admit   MDM  Alcoholism Tachycardia Hyperglycemia Renal insufficiency Vomiting     Elmer Picker, MD 10/07/11 1416

## 2011-10-07 NOTE — H&P (Addendum)
Date of admission 10/07/2011  PRIMARY CARE PHYSICIAN:  Dr. Berkley Ziair in [place], [place]  60 y.o. male presented with vomiting. The history is provided by the patient, the spouse and a relative. His vomiting started 3 days ago. He has associated   abdominal pain. Pertinent negatives include no chills, no cough, no diarrhea, no fever and no headaches. He continues to drink at least 4-5 beers every day. He denies any hemoptysis but had 3 episodes of melena. His abdominal pain, is mostly epigastric. He had an EGD done by Dr. Evette Doffing schooler, that showed, severe erosive esophagitis affecting the distal two-thirds   of the esophagus.    He denies a history of peptic ulcer disease, or pancreatitis or abdominal surgery in the past. He denies diarrhea. He denies cough, shortness of breath. He has been drinking alcohol recently. [facility]   Past Medical History   Diagnosis  Date   .  Hypertension    Diabetes mellitus Hepatitis C   PAST SURGICAL HISTORY:  The patient still has his gallbladder and   appendix and broke his leg in 2009.      FAMILY HISTORY:  The patient's father passed at the age of 32 from a   heart attack.  The patient's mother had lung cancer and passed from a   stroke in December 2011, she was 61 years old.  High blood pressure in   some family.    social history   Substance Use Topics   .  Smoking status:  Former Smoker -- 0.5 packs/day   .  Smokeless tobacco:  Not on file   .  Alcohol Use:  5.4 oz/week     9 Cans of beer per week     Review of Systems  Constitutional: Negative for fever, chills and diaphoresis.  HENT: Negative for neck pain.  Eyes: Negative for redness.  Respiratory: Negative for cough, chest tightness and shortness of breath.  Cardiovascular: Negative for chest pain and palpitations.  Gastrointestinal: Positive for nausea, vomiting and abdominal pain. Negative for diarrhea and blood in stool.  Musculoskeletal: Negative for back  pain.  Skin: Negative for rash.  Neurological: Negative for weakness and headaches.  Psychiatric/Behavioral: Negative for confusion.   Allergies   Review of patient's allergies indicates no known allergies.  Home Medications   No current outpatient prescriptions on file.  BP 200/100  Pulse 116  Temp(Src) 99.1 F (37.3 C) (Oral)  Resp 16  SpO2 100%  Physical Exam  Vitals reviewed.  Constitutional: He is oriented to person, place, and time. He appears well-developed and well-nourished. No distress.  HENT:  Head: Normocephalic and atraumatic.  Eyes: EOM are normal. Pupils are equal, round, and reactive to light.  Neck: Normal range of motion. Neck supple.  Cardiovascular: Regular rhythm and normal heart sounds.  No murmur heard. Tachycardia  Pulmonary/Chest: Effort normal and breath sounds normal. No respiratory distress. He has no wheezes. He has no rales.  Abdominal: Soft. Bowel sounds are normal. He exhibits no distension and no mass. There is tenderness. There is no rebound and no guarding.  Mild epigastric tenderness, with no peritoneal signs  Musculoskeletal: Normal range of motion. He exhibits no edema and no tenderness.  Neurological: He is alert and oriented to person, place, and time. No cranial nerve deficit.  Skin: Skin is warm and dry. He is not diaphoretic.  Psychiatric: He has a normal mood and affect. His behavior is normal.    Assessment  and plan 1. Nausea, vomiting.  This is more likely not related to his erosive       gastritis. We will place him on Protonix 40 mg IV twice a day, as     well as well as sucralfate 1 g by mouth 4 times a day. He has had a recent EGD in June. He could certainly have exacerbation of his severe as erosive esophagitis. A CT scan of the abdomen and pelvis without contrast will be obtained, to rule out nephrolithiasis/pancreatitis/obstruction. Consider gastric outlet obstruction. Has had some melena, if recurs or h and h continues to drop,  consider GI consult .  #2. Acute kidney injury.  His baseline creatinine is around 2.5. This acute kidney injury could be secondary to prerenal acidemia. We will continue IV fluids at 100 mL per hour       and add potassium 20 mEq of this to the fluids.  Hopefully, his       creatinine trend down.  The patient states he does have a       nephrologist and we may need to enlist help with the patient's       creatinine does not turn the corner.  #3. Diabetes mellitus.  I will give some Lantus 30 units subcutaneously       stat and then q.p.m. at 600 and he will be placed on this moderate       sensitivity sliding scale.  #4 Hepatitis C.  stable with normal LFTs #5 Substance abuse. History of alcohol dependence and prior history of cocaine abuse. We'll obtain a UDS. #6. Hypertension.  I placed the patient on hydralazine 10 mg IV when necessary every 6, and continue with Norvasc.  #7 he appears to be tachycardic and somewhat short of breath. We'll do a VQ scan to rule out a PE.    Addendum CT scan  has resulted, and it shows  1. No CT evidence of pancreatitis.  2. Distal esophageal wall thickening again noted, suspicious for  esophagitis.  3. Diverticulosis. No radiographic evidence of diverticulitis.

## 2011-10-07 NOTE — ED Notes (Signed)
Vomiting x3 days. Sent from [facility] for further eval. Per phone report from md, they were unable to draw blood or start iv for fluids

## 2011-10-07 NOTE — ED Notes (Signed)
Pt resting quietly at the time. Remains on cardiac monitor. Vital signs stable. Family at bedside.

## 2011-10-07 NOTE — Progress Notes (Signed)
CRITICAL VALUE ALERT  Critical value received:  mb 7.1  Date of notification:  10/07/11  Time of notification:  [id number]  Critical value read back:yes  Nurse who received alert:  Rafael Bihari, bsn  MD notified (1st page):  abro  Time of first page:  [id number]  MD notified (2nd page):  Time of second page:  Responding MD:  Dortha Kern  Time MD responded: 1800

## 2011-10-08 ENCOUNTER — Inpatient Hospital Stay (HOSPITAL_COMMUNITY): Payer: Medicare Other

## 2011-10-08 LAB — GLUCOSE, CAPILLARY: Glucose-Capillary: 35 mg/dL — CL (ref 70–99)

## 2011-10-08 LAB — COMPREHENSIVE METABOLIC PANEL
AST: 25 U/L (ref 0–37)
Albumin: 3.1 g/dL — ABNORMAL LOW (ref 3.5–5.2)
Alkaline Phosphatase: 90 U/L (ref 39–117)
BUN: 47 mg/dL — ABNORMAL HIGH (ref 6–23)
Chloride: 106 mEq/L (ref 96–112)
Potassium: 3.1 mEq/L — ABNORMAL LOW (ref 3.5–5.1)
Total Bilirubin: 0.3 mg/dL (ref 0.3–1.2)

## 2011-10-08 LAB — MAGNESIUM: Magnesium: 2.7 mg/dL — ABNORMAL HIGH (ref 1.5–2.5)

## 2011-10-08 LAB — CBC
MCH: 33.2 pg (ref 26.0–34.0)
MCHC: 34 g/dL (ref 30.0–36.0)
MCV: 97.9 fL (ref 78.0–100.0)
Platelets: 198 10*3/uL (ref 150–400)
RDW: 13.9 % (ref 11.5–15.5)
RDW: 13.6 % (ref 11.5–15.5)

## 2011-10-08 LAB — CARDIAC PANEL(CRET KIN+CKTOT+MB+TROPI)
CK, MB: 6.7 ng/mL (ref 0.3–4.0)
Relative Index: 1.7 (ref 0.0–2.5)
Total CK: 389 U/L — ABNORMAL HIGH (ref 7–232)

## 2011-10-08 MED ORDER — INSULIN GLARGINE 100 UNIT/ML ~~LOC~~ SOLN
15.0000 [IU] | Freq: Every day | SUBCUTANEOUS | Status: DC
  Filled 2011-10-08: qty 3

## 2011-10-08 MED ORDER — LORAZEPAM 1 MG PO TABS: 1.0000 mg | ORAL_TABLET | Freq: Four times a day (QID) | ORAL | Status: AC | PRN

## 2011-10-08 MED ORDER — TECHNETIUM TO 99M ALBUMIN AGGREGATED
6.0000 | Freq: Once | INTRAVENOUS | Status: AC | PRN
  Administered 2011-10-08: 6 via INTRAVENOUS

## 2011-10-08 MED ORDER — ALBUTEROL SULFATE (5 MG/ML) 0.5% IN NEBU: 2.5000 mg | INHALATION_SOLUTION | Freq: Four times a day (QID) | RESPIRATORY_TRACT | Status: DC | PRN

## 2011-10-08 MED ORDER — DEXTROSE 50 % IV SOLN
INTRAVENOUS | Status: AC
  Administered 2011-10-08: 25 mL
  Filled 2011-10-08: qty 50

## 2011-10-08 MED ORDER — POTASSIUM CHLORIDE CRYS ER 20 MEQ PO TBCR
40.0000 meq | EXTENDED_RELEASE_TABLET | Freq: Once | ORAL | Status: AC
  Administered 2011-10-08: 40 meq via ORAL
  Filled 2011-10-08: qty 2

## 2011-10-08 MED ORDER — DEXTROSE 50 % IV SOLN
INTRAVENOUS | Status: AC
  Administered 2011-10-08: 50 mL
  Filled 2011-10-08: qty 50

## 2011-10-08 MED ORDER — SODIUM CHLORIDE 0.9 % IV SOLN
INTRAVENOUS | Status: DC
  Administered 2011-10-08: 16:00:00 via INTRAVENOUS

## 2011-10-08 MED ORDER — ADULT MULTIVITAMIN W/MINERALS CH
1.0000 | ORAL_TABLET | Freq: Every day | ORAL | Status: DC
  Administered 2011-10-08 – 2011-10-12 (×5): 1 via ORAL
  Filled 2011-10-08 (×5): qty 1

## 2011-10-08 MED ORDER — LORAZEPAM 2 MG/ML IJ SOLN: 1.0000 mg | Freq: Four times a day (QID) | INTRAMUSCULAR | Status: AC | PRN

## 2011-10-08 MED ORDER — FOLIC ACID 1 MG PO TABS
1.0000 mg | ORAL_TABLET | Freq: Every day | ORAL | Status: DC
  Administered 2011-10-08 – 2011-10-12 (×5): 1 mg via ORAL
  Filled 2011-10-08 (×5): qty 1

## 2011-10-08 MED ORDER — VITAMIN B-1 100 MG PO TABS
100.0000 mg | ORAL_TABLET | Freq: Every day | ORAL | Status: DC
  Administered 2011-10-10: 100 mg via ORAL
  Filled 2011-10-08 (×5): qty 1

## 2011-10-08 MED ORDER — THIAMINE HCL 100 MG/ML IJ SOLN
100.0000 mg | Freq: Every day | INTRAMUSCULAR | Status: DC
  Filled 2011-10-08 (×5): qty 1

## 2011-10-08 MED ORDER — DEXTROSE 50 % IV SOLN
INTRAVENOUS | Status: AC
  Administered 2011-10-08: 16:00:00
  Filled 2011-10-08: qty 50

## 2011-10-08 MED ORDER — DEXTROSE 50 % IV SOLN
INTRAVENOUS | Status: AC
  Administered 2011-10-08: 50 mL via INTRAVENOUS
  Filled 2011-10-08: qty 50

## 2011-10-08 MED ORDER — XENON XE 133 GAS
10.0000 | GAS_FOR_INHALATION | Freq: Once | RESPIRATORY_TRACT | Status: AC | PRN
  Administered 2011-10-08: 10 via RESPIRATORY_TRACT

## 2011-10-08 NOTE — Progress Notes (Signed)
Pt CBG= INCREASED TO 61 WILL GIVE HALF AMP D50

## 2011-10-08 NOTE — Progress Notes (Signed)
Am novolog held per Dr. Clementeen Graham d/t hypoglycemic episode this am

## 2011-10-08 NOTE — Progress Notes (Signed)
Pt CBG=140 when rechecked

## 2011-10-08 NOTE — Progress Notes (Addendum)
Lab called with critical value of 43 cbg read 35 gave pt 1 amp of D50 paged Dr Quay Burow awaiting response , Dr Rolene Arbour responded advised him of what was done he also ordered for CIWA protocol to start remain Saline locked

## 2011-10-08 NOTE — Progress Notes (Addendum)
Subjective: Feels much better, just a little nausea today. Has had several hypoglycemic events.  Objective: Vital signs in last 24 hours: Temp:  [97.4 F (36.3 C)-97.5 F (36.4 C)] 97.5 F (36.4 C) (12/24 0700) Pulse Rate:  [90-96] 90  (12/24 0700) Resp:  [18] 18  (12/24 0700) BP: (166-172)/(86-93) 166/86 mmHg (12/24 0700) SpO2:  [98 %] 98 % (12/24 0700) Weight:  [62.143 kg (137 lb)] 137 lb (62.143 kg) (12/23 1608) Weight change:     Intake/Output from previous day:   Total I/O In: 600 [P.O.:600] Out: -    Physical Exam: General: Alert, awake, oriented x3, in no acute distress. HEENT: No bruits, no goiter. Heart: Regular rate and rhythm, without murmurs, rubs, gallops. Lungs: Clear to auscultation bilaterally. Abdomen: Soft, nontender, nondistended, positive bowel sounds. Extremities: No clubbing cyanosis or edema with positive pedal pulses. Neuro: Grossly intact, nonfocal.    Lab Results: Basic Metabolic Panel:  Basename 10/08/11 0350 10/07/11 1550 10/07/11 1038  NA 142 -- 144  K 3.1* -- 3.4*  CL 106 -- 99  CO2 25 -- 30  GLUCOSE 43* -- 385*  BUN 47* -- 53*  CREATININE 3.72* -- 3.70*  CALCIUM 8.0* -- 9.2  MG -- 3.0* --  PHOS -- -- --   Liver Function Tests:  Basename 10/08/11 0350 10/07/11 1038  AST 25 30  ALT 14 19  ALKPHOS 90 122*  BILITOT 0.3 0.6  PROT 6.7 8.4*  ALBUMIN 3.1* 3.8    Basename 10/07/11 1038  LIPASE 42  AMYLASE --   CBC:  Basename 10/08/11 0854 10/08/11 0046 10/07/11 1015  WBC 8.5 13.1* --  NEUTROABS -- -- 12.6*  HGB 10.9* 11.0* --  HCT 32.1* 32.4* --  MCV 97.0 97.9 --  PLT 153 198 --   Cardiac Enzymes:  Basename 10/08/11 0344 10/07/11 2147 10/07/11 1613  CKTOTAL 389* 466* 455*  CKMB 6.7* 7.1* 7.1*  CKMBINDEX -- -- --  TROPONINI <0.30 <0.30 <0.30   CBG:  Basename 10/08/11 1345 10/08/11 1237 10/08/11 1208 10/08/11 0755 10/08/11 0545 10/08/11 0521  GLUCAP 140* 61* 50* 168* 194* 35*   Hemoglobin A1C:  Basename  10/07/11 1550  HGBA1C 7.3*   Thyroid Function Tests:  Basename 10/07/11 1550  TSH 0.692  T4TOTAL --  FREET4 --  T3FREE --  THYROIDAB --   Coagulation:  Basename 10/07/11 1613  LABPROT 13.1  INR 0.97   Urine Drug Screen: Drugs of Abuse     Component Value Date/Time   LABOPIA POSITIVE* 10/07/2011 1619   LABOPIA NEGATIVE 09/30/2009 0243   COCAINSCRNUR NONE DETECTED 10/07/2011 1619   COCAINSCRNUR NEGATIVE 09/30/2009 0243   LABBENZ NONE DETECTED 10/07/2011 1619   LABBENZ NEGATIVE 09/30/2009 0243   AMPHETMU NONE DETECTED 10/07/2011 1619   AMPHETMU NEGATIVE 09/30/2009 0243   THCU POSITIVE* 10/07/2011 1619   LABBARB NONE DETECTED 10/07/2011 1619    Alcohol Level:  Basename 10/07/11 1038  ETH <11    Recent Results (from the past 240 hour(s))  MRSA PCR SCREENING     Status: Normal   Collection Time   10/08/11 10:59 AM      Component Value Range Status Comment   MRSA by PCR NEGATIVE  NEGATIVE  Final     Studies/Results: Ct Abdomen Pelvis Wo Contrast  10/07/2011  *RADIOLOGY REPORT*  Clinical Data: Diffuse abdominal and pelvic pain.  Pancreatitis. Renal insufficiency.  Diabetes and hypertension.  CT ABDOMEN AND PELVIS WITHOUT CONTRAST  Technique:  Multidetector CT imaging of the abdomen and pelvis  was performed following the standard protocol without intravenous contrast.  Comparison: 10/01/2009  Findings: The pancreas has a normal appearance on this noncontrast study.  No evidence of peripancreatic inflammatory changes or fluid collections.  The other abdominal parenchymal organs also have a normal appearance on this noncontrast study.  The gallbladder is unremarkable.  Probable small low attenuation renal cysts remain stable bilaterally.  There is no evidence of hydronephrosis.  No evidence of renal or ureteral calculi.  No soft tissue masses or lymphadenopathy are seen elsewhere within the abdomen or pelvis.  Diverticulosis is again seen, however there is no evidence of  diverticulitis.  No other acute inflammatory process or abnormal fluid collections are identified.  Distal esophageal wall thickening is again seen on the study, suspicious for esophagitis.  IMPRESSION:  1.  No CT evidence of pancreatitis. 2.  Distal esophageal wall thickening again noted, suspicious for esophagitis. 3.  Diverticulosis.  No radiographic evidence of diverticulitis.  Original Report Authenticated By: Marlaine Hind, M.D.   Nm Pulmonary Per & Vent  10/08/2011  *RADIOLOGY REPORT*  Clinical Data:  Shortness of breath.  Renal insufficiency.  NUCLEAR MEDICINE VENTILATION - PERFUSION LUNG SCAN  Technique:  Wash-in, equilibrium, and wash-out phase ventilation images were obtained using Xe-133 gas.  Perfusion images were obtained in multiple projections after intravenous injection of Tc- 56m MAA.  Radiopharmaceuticals:  10.0 mCi Xe-133 gas and 6.0 mCi Tc-77m MAA.  Comparison:  Chest x-ray 02/14/2011.  Findings: No ventilation or perfusion defects to suggest pulmonary embolus.  IMPRESSION: Normal study.  No evidence of pulmonary embolus.  Original Report Authenticated By: Raelyn Number, M.D.    Medications: Scheduled Meds:   . amLODipine  10 mg Oral Daily  . dextrose      . dextrose      . enoxaparin  30 mg Subcutaneous Q24H  . folic acid  1 mg Oral Daily  . insulin aspart  0-20 Units Subcutaneous TID WC  . insulin aspart  0-5 Units Subcutaneous QHS  . mulitivitamin with minerals  1 tablet Oral Daily  . pantoprazole (PROTONIX) IV  40 mg Intravenous Q12H  . thiamine  100 mg Oral Daily   Or  . thiamine  100 mg Intravenous Daily  . thiamine  100 mg Oral Daily  . DISCONTD: albuterol  2.5 mg Nebulization Q6H  . DISCONTD: enoxaparin  40 mg Subcutaneous Q24H  . DISCONTD: insulin glargine  30 Units Subcutaneous Daily  . DISCONTD: general admission iv infusion   Intravenous Q24H   Continuous Infusions:  PRN Meds:.acetaminophen, acetaminophen, albuterol, alum & mag hydroxide-simeth,  hydrALAZINE, LORazepam, LORazepam, morphine, ondansetron (ZOFRAN) IV, ondansetron, technetium albumin aggregated, xenon xe 133  Assessment/Plan:  Principal Problem:  *Nausea and vomiting Active Problems:  HEPATITIS C  DIABETES MELLITUS, TYPE II  ABUSE, ALCOHOL, IN REMISSION  ABUSE, OPIOID, IN REMISSION  ABUSE, COCAINE, IN REMISSION  HYPERTENSION  Abdominal  pain, other specified site   #1 nausea and vomiting: Likely secondary to erosive esophagitis. Upon further questioning patient states that he had not been taking his proton pump inhibitor at home. He is doing better on a PPI and sucralfate.  #2 acute on chronic kidney disease stage III: It appears baseline creatinine is around 2.5. His creatinine has been around 3.7 since admission. Will increase IV fluids. It may be that this is his new baseline. He does not have any indications for acute dialysis. If his creatinine fails to improve over the next 24-48 hours, may need a renal  consultation. For now we'll get a renal ultrasound.  #3 hypokalemia: Likely secondary to GI losses. Replete orally, check magnesium level.  #4 insulin-dependent diabetes: Has had multiple hypoglycemic episodes today. Will stop all insulin for now including Lantus and sliding scale.   LOS: 1 day   [facility] Pager: [phone] 10/08/2011, 2:58 PM

## 2011-10-08 NOTE — Progress Notes (Signed)
Pt CBG=35. Pt given AMP D50. Pt given peanut butter and crackers.  Will recheck CBG.

## 2011-10-08 NOTE — Progress Notes (Signed)
Patients CBG=50 Pt given 1 orange juice to drink pt also ate an ice pop and jello. 1200 pm novolog held Will recheck CBG.

## 2011-10-08 NOTE — Progress Notes (Signed)
Rechecked CBG=152. Will continue to monitor.

## 2011-10-09 ENCOUNTER — Other Ambulatory Visit: Payer: Self-pay

## 2011-10-09 ENCOUNTER — Inpatient Hospital Stay (HOSPITAL_COMMUNITY): Payer: Medicare Other

## 2011-10-09 LAB — CBC
HCT: 32.3 % — ABNORMAL LOW (ref 39.0–52.0)
MCH: 32.8 pg (ref 26.0–34.0)
MCHC: 33.7 g/dL (ref 30.0–36.0)
MCV: 97.3 fL (ref 78.0–100.0)
RDW: 13.5 % (ref 11.5–15.5)

## 2011-10-09 LAB — BASIC METABOLIC PANEL
BUN: 32 mg/dL — ABNORMAL HIGH (ref 6–23)
CO2: 25 mEq/L (ref 19–32)
Chloride: 103 mEq/L (ref 96–112)
Creatinine, Ser: 2.91 mg/dL — ABNORMAL HIGH (ref 0.50–1.35)

## 2011-10-09 LAB — GLUCOSE, CAPILLARY
Glucose-Capillary: 107 mg/dL — ABNORMAL HIGH (ref 70–99)
Glucose-Capillary: 115 mg/dL — ABNORMAL HIGH (ref 70–99)
Glucose-Capillary: 120 mg/dL — ABNORMAL HIGH (ref 70–99)
Glucose-Capillary: 191 mg/dL — ABNORMAL HIGH (ref 70–99)
Glucose-Capillary: 45 mg/dL — ABNORMAL LOW (ref 70–99)

## 2011-10-09 LAB — CK: Total CK: 328 U/L — ABNORMAL HIGH (ref 7–232)

## 2011-10-09 LAB — TROPONIN I: Troponin I: <0.3 ng/mL (ref <0.30)

## 2011-10-09 MED ORDER — DEXTROSE-NACL 5-0.9 % IV SOLN
INTRAVENOUS | Status: DC
  Administered 2011-10-09 – 2011-10-11 (×6): via INTRAVENOUS

## 2011-10-09 MED ORDER — DEXTROSE 50 % IV SOLN
INTRAVENOUS | Status: AC
  Administered 2011-10-09: 50 mL
  Filled 2011-10-09: qty 50

## 2011-10-09 NOTE — Progress Notes (Signed)
Rechecked CBG=45 Pt given AMP D50.  Will recheck CBG.

## 2011-10-09 NOTE — Progress Notes (Signed)
Rechecked CBG=191 Notified MD of second hypoglycemic episode since 2000. Will continue to monitor.

## 2011-10-09 NOTE — Progress Notes (Signed)
Checked CBG at [place]. CBG=65 Pt given orange juice and peanut butter and crackers. Will recheck CBG.

## 2011-10-09 NOTE — Progress Notes (Signed)
Subjective: Patient seen and examined this am.a gain had hypoglycemic episodes overnight and placed on D5NS. C/o substernal burning this am. Given maalox with relief. EKG and troponin ordered  Objective:  Vital signs in last 24 hours:  Filed Vitals:   10/09/11 0100 10/09/11 0124 10/09/11 0500 10/09/11 0800  BP: 197/96 168/80 177/83 174/82  Pulse: 95  83 89  Temp: 98.8 F (37.1 C)  99.1 F (37.3 C) 98.6 F (37 C)  TempSrc: Oral  Oral   Resp: 20  20 16   Height:      Weight:   67.7 kg (149 lb 4 oz)   SpO2: 100%  100% 100%    Intake/Output from previous day:   Intake/Output Summary (Last 24 hours) at 10/09/11 1151 Last data filed at 10/09/11 0859  Gross per 24 hour  Intake    480 ml  Output    800 ml  Net   -320 ml    Physical Exam:  General: elderly male  in no acute distress. HEENT: no pallor, no icterus, moist oral mucosa, no JVD, no lymphadenopathy Heart: Normal  s1 &s2  Regular rate and rhythm, without murmurs, rubs, gallops. Lungs: Clear to auscultation bilaterally. Abdomen: Soft, nontender, nondistended, positive bowel sounds. Extremities: No clubbing cyanosis or edema with positive pedal pulses. Neuro: Alert, awake, oriented x3, nonfocal.   Lab Results:  Basic Metabolic Panel:    Component Value Date/Time   NA 139 10/09/2011 0615   K 4.4 10/09/2011 0615   CL 103 10/09/2011 0615   CO2 25 10/09/2011 0615   BUN 32* 10/09/2011 0615   CREATININE 2.91* 10/09/2011 0615   GLUCOSE 127* 10/09/2011 0615   CALCIUM 8.1* 10/09/2011 0615   CBC:    Component Value Date/Time   WBC 6.6 10/09/2011 0615   HGB 10.9* 10/09/2011 0615   HCT 32.3* 10/09/2011 0615   PLT 181 10/09/2011 0615   MCV 97.3 10/09/2011 0615   NEUTROABS 12.6* 10/07/2011 1015   LYMPHSABS 0.7 10/07/2011 1015   MONOABS 1.3* 10/07/2011 1015   EOSABS 0.0 10/07/2011 1015   BASOSABS 0.0 10/07/2011 1015    Recent Results (from the past 240 hour(s))  MRSA PCR SCREENING     Status: Normal   Collection Time   10/08/11 10:59 AM      Component Value Range Status Comment   MRSA by PCR NEGATIVE  NEGATIVE  Final     Studies/Results: Ct Abdomen Pelvis Wo Contrast  10/07/2011  *RADIOLOGY REPORT*  Clinical Data: Diffuse abdominal and pelvic pain.  Pancreatitis. Renal insufficiency.  Diabetes and hypertension.  CT ABDOMEN AND PELVIS WITHOUT CONTRAST  Technique:  Multidetector CT imaging of the abdomen and pelvis was performed following the standard protocol without intravenous contrast.  Comparison: 10/01/2009  Findings: The pancreas has a normal appearance on this noncontrast study.  No evidence of peripancreatic inflammatory changes or fluid collections.  The other abdominal parenchymal organs also have a normal appearance on this noncontrast study.  The gallbladder is unremarkable.  Probable small low attenuation renal cysts remain stable bilaterally.  There is no evidence of hydronephrosis.  No evidence of renal or ureteral calculi.  No soft tissue masses or lymphadenopathy are seen elsewhere within the abdomen or pelvis.  Diverticulosis is again seen, however there is no evidence of diverticulitis.  No other acute inflammatory process or abnormal fluid collections are identified.  Distal esophageal wall thickening is again seen on the study, suspicious for esophagitis.  IMPRESSION:  1.  No CT evidence of pancreatitis.  2.  Distal esophageal wall thickening again noted, suspicious for esophagitis. 3.  Diverticulosis.  No radiographic evidence of diverticulitis.  Original Report Authenticated By: Marlaine Hind, M.D.   US Renal  10/09/2011   *RADIOLOGY REPORT*  Clinical Data:History of renal failure  RENAL/URINARY TRACT ULTRASOUND COMPLETE  Comparison:  CT abdomen pelvis of 10/07/2011  Findings:  Right Kidney:  No hydronephrosis is seen.  The right kidney measures 10.5 cm sagittally and is somewhat echogenic suggesting chronic renal medical disease.  A tiny cyst is present in the lower pole of 9 mm  in diameter.  Left Kidney:  There is slight fullness of left pelvocaliceal systems suggesting  mild hydronephrosis.  The left kidney measures 9.6 cm and also is echogenic.  A cyst is present in the upper pole of 1.3 cm in diameter.  Bladder:  The urinary bladder is unremarkable.  Bilateral ureteral jets are noted.  IMPRESSION:  1.  Mild left hydronephrosis.  No renal calculi by ultrasound. 2.  Echogenic kidneys consistent with chronic renal medical disease.  Original Report Authenticated By: Joretta Bachelor, M.D.   Nm Pulmonary Per & Vent  10/08/2011  *RADIOLOGY REPORT*  Clinical Data:  Shortness of breath.  Renal insufficiency.  NUCLEAR MEDICINE VENTILATION - PERFUSION LUNG SCAN  Technique:  Wash-in, equilibrium, and wash-out phase ventilation images were obtained using Xe-133 gas.  Perfusion images were obtained in multiple projections after intravenous injection of Tc- 41m MAA.  Radiopharmaceuticals:  10.0 mCi Xe-133 gas and 6.0 mCi Tc-9m MAA.  Comparison:  Chest x-ray 02/14/2011.  Findings: No ventilation or perfusion defects to suggest pulmonary embolus.  IMPRESSION: Normal study.  No evidence of pulmonary embolus.  Original Report Authenticated By: Raelyn Number, M.D.    Medications: Scheduled Meds:   . amLODipine  10 mg Oral Daily  . dextrose      . dextrose      . dextrose      . dextrose      . enoxaparin  30 mg Subcutaneous Q24H  . folic acid  1 mg Oral Daily  . mulitivitamin with minerals  1 tablet Oral Daily  . pantoprazole (PROTONIX) IV  40 mg Intravenous Q12H  . potassium chloride  40 mEq Oral Once  . thiamine  100 mg Oral Daily   Or  . thiamine  100 mg Intravenous Daily  . thiamine  100 mg Oral Daily  . DISCONTD: insulin aspart  0-20 Units Subcutaneous TID WC  . DISCONTD: insulin aspart  0-5 Units Subcutaneous QHS  . DISCONTD: insulin glargine  15 Units Subcutaneous QHS  . DISCONTD: insulin glargine  30 Units Subcutaneous Daily   Continuous Infusions:   . dextrose 5 %  and 0.9% NaCl 50 mL/hr at 10/09/11 0226  . DISCONTD: sodium chloride 75 mL/hr at 10/08/11 1607   PRN Meds:.acetaminophen, acetaminophen, albuterol, alum & mag hydroxide-simeth, hydrALAZINE, LORazepam, LORazepam, morphine, ondansetron (ZOFRAN) IV, ondansetron  ASSESSMENT: 54 male with erosive esophagitis, CKD, Hep C, DM type 2 on insulin, HTN  and hx of etoh abuse presented with nausea and vomiting with ? Coffee ground emesis. Patient now with recurrent hypoglycemic episodes in the hospital.   PLAN:  #1 nausea and vomiting with malena:  Likely secondary to erosive esophagitis. CT abd showed distal esophagial thickening suspicious for esophagitis. Patient informed not been taking his proton pump inhibitor at home. He is doing better on PPI bid and sucralfate.  Cont maalox No further vomiting H&h stable Call GI Michail Sermon) if  H&h drops or further melena  #2 insulin-dependent diabetes with frequent hypoglycemic episodes.  Has had multiple hypoglycemic episodes yesterday and overnight. Given D50 s and placed on D5NS yesterday holding insulin for now. Hypoglycemia possibly from poor po intake and worsened renal fn   A1C  This admission of  7.3  #3 acute on chronic kidney disease stage III:  baseline creatinine is around 2.5. His creatinine was 3.7 on admission.  Giving IV  fluids Renal fn slowly improving, monitor in am Renal USG shows mild left hydronephrosis and no calculi. Echogenic kidneys consistent with chronic renal medical  disease.  4Chest pain  patient c/o substernal burning this am. Relieved with maalox  EKG done showed TWI in aVL and lateral leads. It appears he had diffuse TWI in previous  EKGs.( last EFG in the system does not show these changes however) . A poc troponin done in ED was borderline elevated to 0.09. however subsequent CEs repeated have been remained negative and patient's pain have responded to maalox making it more of a GI issue. Again he has CKD which may not  give an accurate utility of cardiac enzymes. Will need to call cardiology if chest pain unimproved and EKG changes occur.  5 hypokalemia:  Likely secondary to GI losses. Repleted orally   6 Hepatitis C. stable with normal LFTs  7 Substance abuse. History of alcohol dependence and prior history of cocaine abuse. Monitor for withdrawal 8. Hypertension.  continue with Norvasc.  DVT prophylaxis: SCD boots    LOS: 2 days   Alexiz Sustaita 10/09/2011, 11:51 AM

## 2011-10-10 ENCOUNTER — Other Ambulatory Visit: Payer: Self-pay

## 2011-10-10 LAB — CBC
HCT: 33.1 % — ABNORMAL LOW (ref 39.0–52.0)
Hemoglobin: 11.2 g/dL — ABNORMAL LOW (ref 13.0–17.0)
MCHC: 33.8 g/dL (ref 30.0–36.0)

## 2011-10-10 LAB — GLUCOSE, CAPILLARY
Glucose-Capillary: 191 mg/dL — ABNORMAL HIGH (ref 70–99)
Glucose-Capillary: 250 mg/dL — ABNORMAL HIGH (ref 70–99)
Glucose-Capillary: 193 mg/dL — ABNORMAL HIGH (ref 70–99)

## 2011-10-10 LAB — BASIC METABOLIC PANEL
BUN: 16 mg/dL (ref 6–23)
Chloride: 107 mEq/L (ref 96–112)
Creatinine, Ser: 2.33 mg/dL — ABNORMAL HIGH (ref 0.50–1.35)
GFR calc Af Amer: 33 mL/min — ABNORMAL LOW (ref >90)
Glucose, Bld: 256 mg/dL — ABNORMAL HIGH (ref 70–99)

## 2011-10-10 MED ORDER — POTASSIUM CHLORIDE CRYS ER 20 MEQ PO TBCR
20.0000 meq | EXTENDED_RELEASE_TABLET | Freq: Once | ORAL | Status: AC
  Administered 2011-10-10: 20 meq via ORAL

## 2011-10-10 MED ORDER — POTASSIUM CHLORIDE CRYS ER 20 MEQ PO TBCR
EXTENDED_RELEASE_TABLET | ORAL | Status: AC
  Administered 2011-10-10: 20 meq via ORAL
  Filled 2011-10-10: qty 1

## 2011-10-10 NOTE — Progress Notes (Signed)
Subjective: No events overnight. Patient denies chest pain, shortness of breath, abdominal pain. Patient complaints of 2 episodes of non bloody vomiting.  Objective:  Vital signs in last 24 hours:  Filed Vitals:   10/10/11 0400 10/10/11 0800 10/10/11 1400 10/10/11 1647  BP: 160/83 199/96 170/79 161/83  Pulse: 77 97 88   Temp: 98.3 F (36.8 C) 98.8 F (37.1 C) 99.1 F (37.3 C)   TempSrc: Oral  Oral   Resp: 20 18 18    Height:      Weight: 67.9 kg (149 lb 11.1 oz)     SpO2: 99% 100% 100%     Intake/Output from previous day:   Intake/Output Summary (Last 24 hours) at 10/10/11 1717 Last data filed at 10/10/11 0900  Gross per 24 hour  Intake    360 ml  Output    600 ml  Net   -240 ml    Physical Exam: General: Alert, awake, oriented x3, in no acute distress. HEENT: No bruits, no goiter. Moist mucous membranes, no scleral icterus, no conjunctival pallor. Heart: Regular rate and rhythm, S1/S2 +, no murmurs, rubs, gallops. Lungs: Clear to auscultation bilaterally. No wheezing, no rhonchi, no rales.  Abdomen: Soft, nontender, nondistended, positive bowel sounds. Extremities: No clubbing or cyanosis, no pitting edema,  positive pedal pulses. Neuro: Grossly nonfocal.  Lab Results:  Basic Metabolic Panel:    Component Value Date/Time   NA 140 10/10/2011 0953   K 3.3* 10/10/2011 0953   CL 107 10/10/2011 0953   CO2 22 10/10/2011 0953   BUN 16 10/10/2011 0953   CREATININE 2.33* 10/10/2011 0953   GLUCOSE 256* 10/10/2011 0953   CALCIUM 7.9* 10/10/2011 0953   CBC:    Component Value Date/Time   WBC 6.6 10/10/2011 0953   HGB 11.2* 10/10/2011 0953   HCT 33.1* 10/10/2011 0953   PLT 177 10/10/2011 0953   MCV 95.7 10/10/2011 0953   NEUTROABS 12.6* 10/07/2011 1015   LYMPHSABS 0.7 10/07/2011 1015   MONOABS 1.3* 10/07/2011 1015   EOSABS 0.0 10/07/2011 1015   BASOSABS 0.0 10/07/2011 1015      Lab 10/10/11 0953 10/09/11 0615 10/08/11 0854 10/08/11 0046 10/07/11 1015  WBC  6.6 6.6 8.5 13.1* 14.6*  HGB 11.2* 10.9* 10.9* 11.0* 12.4*  HCT 33.1* 32.3* 32.1* 32.4* 35.8*  PLT 177 181 153 198 210  MCV 95.7 97.3 97.0 97.9 97.0  MCH 32.4 32.8 32.9 33.2 33.6  MCHC 33.8 33.7 34.0 34.0 34.6  RDW 13.1 13.5 13.6 13.9 13.9  LYMPHSABS -- -- -- -- 0.7  MONOABS -- -- -- -- 1.3*  EOSABS -- -- -- -- 0.0  BASOSABS -- -- -- -- 0.0  BANDABS -- -- -- -- --    Lab 10/10/11 0953 10/09/11 0615 10/08/11 1545 10/08/11 0350 10/07/11 1550 10/07/11 1038 10/07/11 1015  NA 140 139 -- 142 -- 144 143  K 3.3* 4.4 -- 3.1* -- 3.4* 3.6  CL 107 103 -- 106 -- 99 99  CO2 22 25 -- 25 -- 30 28  GLUCOSE 256* 127* -- 43* -- 385* 392*  BUN 16 32* -- 47* -- 53* 53*  CREATININE 2.33* 2.91* -- 3.72* -- 3.70* 3.71*  CALCIUM 7.9* 8.1* -- 8.0* -- 9.2 9.2  MG -- -- 2.7* -- 3.0* -- --    Lab 10/07/11 1613  INR 0.97  PROTIME --   Cardiac markers:  Lab 10/09/11 0910 10/08/11 0344 10/07/11 2147 10/07/11 1613  CKMB -- 6.7* 7.1* 7.1*  TROPONINI <0.30 <0.30 <  0.30 --  MYOGLOBIN -- -- -- --   No components found with this basename: POCBNP:3 Recent Results (from the past 240 hour(s))  MRSA PCR SCREENING     Status: Normal   Collection Time   10/08/11 10:59 AM      Component Value Range Status Comment   MRSA by PCR NEGATIVE  NEGATIVE  Final     Studies/Results: US Renal  10/09/2011  IMPRESSION:  1.  Mild left hydronephrosis.  No renal calculi by ultrasound. 2.  Echogenic kidneys consistent with chronic renal medical disease.      Medications: Scheduled Meds:   . amLODipine  10 mg Oral Daily  . enoxaparin  30 mg Subcutaneous Q24H  . folic acid  1 mg Oral Daily  . mulitivitamin with minerals  1 tablet Oral Daily  . pantoprazole (PROTONIX) IV  40 mg Intravenous Q12H  . potassium chloride  20 mEq Oral Once  . thiamine  100 mg Oral Daily   Or  . thiamine  100 mg Intravenous Daily  . thiamine  100 mg Oral Daily   Continuous Infusions:   . dextrose 5 % and 0.9% NaCl 50 mL/hr at 10/10/11  1143   PRN Meds:.acetaminophen, acetaminophen, albuterol, alum & mag hydroxide-simeth, hydrALAZINE, LORazepam, LORazepam, morphine, ondansetron (ZOFRAN) IV, ondansetron  Assessment/Plan:  Principal Problem:   *Nausea and vomiting - Likely secondary to erosive esophagitis.  - CT abd showed distal esophagial thickening suspicious for esophagitis - continue Protonix 40 mg IV Q 12Hours  Active Problems:  Insulin-dependent diabetes with frequent hypoglycemic episodes.  - Hypoglycemia possibly from poor po intake and worsened renal fn  - continue to monitor CBG  Acute on chronic kidney disease stage III:  - slowly improving - continue to monitor BUN/Cr  Hypokalemia - Likely secondary to GI losses  - check magnesium level - replete with potassium 20 meq  History of alcohol abuse - continue folic acid and thiamine  DVT prophylaxis:  - SCD boots  EDUCATION - test results and diagnostic studies were discussed with patient  at the bedside - patient has verbalized the understanding - questions were answered at the bedside and contact information was provided for additional questions or concerns   LOS: 3 days   [place], Sae Handrich 10/10/2011, 5:17 PM  [facility] Pager: [phone]

## 2011-10-10 NOTE — Progress Notes (Signed)
Pt ambulated to BR to "gag and throw up". HR elevated to 100s. Pt assisted back to bed. BP 180s/100s manually. MD notified. Zofran given for nausea and PRN hydralazine and scheduled norvasc given for elevated blood pressure. Will continue to monitor closely. Hulen Luster, RN

## 2011-10-11 LAB — CBC
HCT: 32.6 % — ABNORMAL LOW (ref 39.0–52.0)
MCHC: 34 g/dL (ref 30.0–36.0)
Platelets: 187 10*3/uL (ref 150–400)
RDW: 13.2 % (ref 11.5–15.5)
WBC: 8 10*3/uL (ref 4.0–10.5)

## 2011-10-11 LAB — BASIC METABOLIC PANEL
Chloride: 108 mEq/L (ref 96–112)
GFR calc Af Amer: 33 mL/min — ABNORMAL LOW (ref >90)
GFR calc non Af Amer: 28 mL/min — ABNORMAL LOW (ref >90)
Potassium: 3.5 mEq/L (ref 3.5–5.1)
Sodium: 139 mEq/L (ref 135–145)

## 2011-10-11 LAB — MAGNESIUM: Magnesium: 1.9 mg/dL (ref 1.5–2.5)

## 2011-10-11 LAB — GLUCOSE, CAPILLARY
Glucose-Capillary: 200 mg/dL — ABNORMAL HIGH (ref 70–99)
Glucose-Capillary: 181 mg/dL — ABNORMAL HIGH (ref 70–99)
Glucose-Capillary: 211 mg/dL — ABNORMAL HIGH (ref 70–99)

## 2011-10-11 MED ORDER — HYDRALAZINE HCL 25 MG PO TABS
25.0000 mg | ORAL_TABLET | Freq: Three times a day (TID) | ORAL | Status: DC
  Administered 2011-10-11 – 2011-10-12 (×4): 25 mg via ORAL
  Filled 2011-10-11 (×6): qty 1

## 2011-10-11 NOTE — Progress Notes (Signed)
Inpatient Diabetes Program Recommendations  AACE/ADA: New Consensus Statement on Inpatient Glycemic Control (2009)  Target Ranges:  Prepandial:   less than 140 mg/dL      Peak postprandial:   less than 180 mg/dL (1-2 hours)      Critically ill patients:  140 - 180 mg/dL   Reason for Visit: Note patient admitted with nausea and vomiting and CBG's low.  CBG's now greater than 200 mg/dL.  Note patient was on Lantus 30 units prior to admit.  Consider restarting a portion of Lantus since fasting CBG was 200 mg/dL.  Inpatient Diabetes Program Recommendations Insulin - Basal: Add Lantus 10 units daily. Correction (SSI): Consider adding Novolog sensitive tid with meals.

## 2011-10-11 NOTE — Progress Notes (Signed)
Patient ID: Roger Vazquez, male   DOB: April 01, 1951, 60 y.o.   MRN: [id number] Subjective: No events overnight. Patient denies chest pain, shortness of breath, abdominal pain. Had bowel movement and reports ambulating.  Objective:  Vital signs in last 24 hours:  Filed Vitals:   10/10/11 2100 10/10/11 2248 10/11/11 0500 10/11/11 0607  BP: 173/85 176/92 180/93 180/93  Pulse: 99  100   Temp: 98.6 F (37 C)  98 F (36.7 C)   TempSrc: Oral  Oral   Resp: 20  20   Height:      Weight:   68.856 kg (151 lb 12.8 oz)   SpO2: 99%  100%     Intake/Output from previous day:  No intake or output data in the 24 hours ending 10/11/11 1416  Physical Exam: General: Alert, awake, oriented x3, in no acute distress. HEENT: No bruits, no goiter. Moist mucous membranes, no scleral icterus, no conjunctival pallor. Heart: Regular rate and rhythm, S1/S2 +, no murmurs, rubs, gallops. Lungs: Clear to auscultation bilaterally. No wheezing, no rhonchi, no rales.  Abdomen: Soft, nontender, nondistended, positive bowel sounds. Extremities: No clubbing or cyanosis, no pitting edema,  positive pedal pulses. Neuro: Grossly nonfocal.  Lab Results:  Basic Metabolic Panel:    Component Value Date/Time   NA 139 10/11/2011 1025   K 3.5 10/11/2011 1025   CL 108 10/11/2011 1025   CO2 21 10/11/2011 1025   BUN 12 10/11/2011 1025   CREATININE 2.34* 10/11/2011 1025   GLUCOSE 208* 10/11/2011 1025   CALCIUM 7.9* 10/11/2011 1025   CBC:    Component Value Date/Time   WBC 8.0 10/11/2011 1025   HGB 11.1* 10/11/2011 1025   HCT 32.6* 10/11/2011 1025   PLT 187 10/11/2011 1025   MCV 95.6 10/11/2011 1025   NEUTROABS 12.6* 10/07/2011 1015   LYMPHSABS 0.7 10/07/2011 1015   MONOABS 1.3* 10/07/2011 1015   EOSABS 0.0 10/07/2011 1015   BASOSABS 0.0 10/07/2011 1015      Lab 10/11/11 1025 10/10/11 0953 10/09/11 0615 10/08/11 0854 10/08/11 0046 10/07/11 1015  WBC 8.0 6.6 6.6 8.5 13.1* --  HGB 11.1* 11.2* 10.9* 10.9*  11.0* --  HCT 32.6* 33.1* 32.3* 32.1* 32.4* --  PLT 187 177 181 153 198 --  MCV 95.6 95.7 97.3 97.0 97.9 --  MCH 32.6 32.4 32.8 32.9 33.2 --  MCHC 34.0 33.8 33.7 34.0 34.0 --  RDW 13.2 13.1 13.5 13.6 13.9 --  LYMPHSABS -- -- -- -- -- 0.7  MONOABS -- -- -- -- -- 1.3*  EOSABS -- -- -- -- -- 0.0  BASOSABS -- -- -- -- -- 0.0  BANDABS -- -- -- -- -- --    Lab 10/11/11 1025 10/10/11 0953 10/09/11 0615 10/08/11 1545 10/08/11 0350 10/07/11 1550 10/07/11 1038  NA 139 140 139 -- 142 -- 144  K 3.5 3.3* 4.4 -- 3.1* -- 3.4*  CL 108 107 103 -- 106 -- 99  CO2 21 22 25  -- 25 -- 30  GLUCOSE 208* 256* 127* -- 43* -- 385*  BUN 12 16 32* -- 47* -- 53*  CREATININE 2.34* 2.33* 2.91* -- 3.72* -- 3.70*  CALCIUM 7.9* 7.9* 8.1* -- 8.0* -- 9.2  MG 1.9 -- -- 2.7* -- 3.0* --    Lab 10/07/11 1613  INR 0.97  PROTIME --   Cardiac markers:  Lab 10/09/11 0910 10/08/11 0344 10/07/11 2147 10/07/11 1613  CKMB -- 6.7* 7.1* 7.1*  TROPONINI <0.30 <0.30 <0.30 --  MYOGLOBIN -- -- -- --  No components found with this basename: POCBNP:3 Recent Results (from the past 240 hour(s))  MRSA PCR SCREENING     Status: Normal   Collection Time   10/08/11 10:59 AM      Component Value Range Status Comment   MRSA by PCR NEGATIVE  NEGATIVE  Final     Studies/Results: No results found.  Medications: Scheduled Meds:   . amLODipine  10 mg Oral Daily  . enoxaparin  30 mg Subcutaneous Q24H  . folic acid  1 mg Oral Daily  . mulitivitamin with minerals  1 tablet Oral Daily  . pantoprazole (PROTONIX) IV  40 mg Intravenous Q12H  . thiamine  100 mg Oral Daily   Or  . thiamine  100 mg Intravenous Daily  . thiamine  100 mg Oral Daily   Continuous Infusions:   . dextrose 5 % and 0.9% NaCl 50 mL/hr at 10/11/11 0102   PRN Meds:.acetaminophen, acetaminophen, albuterol, alum & mag hydroxide-simeth, hydrALAZINE, LORazepam, LORazepam, morphine, ondansetron (ZOFRAN) IV, ondansetron  Assessment/Plan:   Principal Problem:     *Nausea and vomiting  - Likely secondary to erosive esophagitis.  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  - Hypoglycemia possibly from poor po intake and worsened renal fn  - continue to monitor CBG   Acute on chronic kidney disease stage III:  - slowly improving  - continue to monitor BUN/Cr   Hypokalemia  - Likely secondary to GI losses  - magnesium level is within normal limits - resolved  History of alcohol abuse  - continue folic acid and thiamine   DVT prophylaxis:  - SCD boots   EDUCATION  - test results and diagnostic studies were discussed with patient at the bedside  - patient has verbalized the understanding  - questions were answered at the bedside and contact information was provided for additional questions or concerns     LOS: 4 days   [place], Elysa Womac 10/11/2011, 2:16 PM  [facility] Pager: [phone]

## 2011-10-12 LAB — BASIC METABOLIC PANEL
CO2: 22 mEq/L (ref 19–32)
Calcium: 8.2 mg/dL — ABNORMAL LOW (ref 8.4–10.5)
Creatinine, Ser: 2.31 mg/dL — ABNORMAL HIGH (ref 0.50–1.35)
GFR calc non Af Amer: 29 mL/min — ABNORMAL LOW (ref >90)
Glucose, Bld: 167 mg/dL — ABNORMAL HIGH (ref 70–99)
Sodium: 140 mEq/L (ref 135–145)

## 2011-10-12 LAB — CBC
MCH: 32.6 pg (ref 26.0–34.0)
MCHC: 34.4 g/dL (ref 30.0–36.0)
MCV: 94.9 fL (ref 78.0–100.0)
Platelets: 192 10*3/uL (ref 150–400)
RBC: 3.34 MIL/uL — ABNORMAL LOW (ref 4.22–5.81)

## 2011-10-12 LAB — GLUCOSE, CAPILLARY

## 2011-10-12 LAB — MAGNESIUM: Magnesium: 2 mg/dL (ref 1.5–2.5)

## 2011-10-12 MED ORDER — ADULT MULTIVITAMIN W/MINERALS CH: 1.0000 | ORAL_TABLET | Freq: Every day | ORAL | Status: DC

## 2011-10-12 MED ORDER — FOLIC ACID 1 MG PO TABS: 1.0000 mg | ORAL_TABLET | Freq: Every day | ORAL | Status: AC

## 2011-10-12 MED ORDER — ACETAMINOPHEN 325 MG PO TABS: 650.0000 mg | ORAL_TABLET | Freq: Four times a day (QID) | ORAL | Status: AC | PRN

## 2011-10-12 MED ORDER — PANTOPRAZOLE SODIUM 40 MG PO TBEC: 40.0000 mg | DELAYED_RELEASE_TABLET | Freq: Two times a day (BID) | ORAL | Status: DC

## 2011-10-12 MED ORDER — POTASSIUM CHLORIDE CRYS ER 20 MEQ PO TBCR
20.0000 meq | EXTENDED_RELEASE_TABLET | Freq: Once | ORAL | Status: AC
  Administered 2011-10-12: 20 meq via ORAL
  Filled 2011-10-12: qty 1

## 2011-10-12 MED ORDER — HYDRALAZINE HCL 25 MG PO TABS: 50.0000 mg | ORAL_TABLET | Freq: Three times a day (TID) | ORAL | Status: DC

## 2011-10-12 MED ORDER — THIAMINE HCL 100 MG PO TABS: 100.0000 mg | ORAL_TABLET | Freq: Every day | ORAL | Status: AC

## 2011-10-12 NOTE — Progress Notes (Signed)
Utilization Review Completed.Donne Anon T12/28/2012

## 2011-10-12 NOTE — Discharge Summary (Signed)
Patient ID: Roger Vazquez MRN: [id number] DOB/AGE: July 11, 1951 60 y.o.  Admit date: 10/07/2011 Discharge date: 10/12/2011  Primary Care Physician:  Drema Pry, DO, DO  Discharge Diagnoses:    Present on Admission:  .HEPATITIS C .DIABETES MELLITUS, TYPE II .ABUSE, ALCOHOL, IN REMISSION .ABUSE, COCAINE, IN REMISSION .ABUSE, OPIOID, IN REMISSION .HYPERTENSION .Abdominal  pain, other specified site .Nausea and vomiting  Principal Problem:  *Nausea and vomiting Active Problems:  HEPATITIS C  DIABETES MELLITUS, TYPE II  ABUSE, ALCOHOL, IN REMISSION  ABUSE, OPIOID, IN REMISSION  ABUSE, COCAINE, IN REMISSION  HYPERTENSION  Abdominal  pain, other specified site   Current Discharge Medication List    START taking these medications   Details  acetaminophen (TYLENOL) 325 MG tablet Take 2 tablets (650 mg total) by mouth every 6 (six) hours as needed (or Fever >/= 101). Qty: 30 tablet, Refills: 0    folic acid (FOLVITE) 1 MG tablet Take 1 tablet (1 mg total) by mouth daily. Qty: 30 tablet, Refills: 0    hydrALAZINE (APRESOLINE) 25 MG tablet Take 2 tablets (50 mg total) by mouth every 8 (eight) hours. Qty: 90 tablet, Refills: 3    Multiple Vitamin (MULITIVITAMIN WITH MINERALS) TABS Take 1 tablet by mouth daily. Qty: 30 tablet, Refills: 0    pantoprazole (PROTONIX) 40 MG tablet Take 1 tablet (40 mg total) by mouth 2 (two) times daily. Qty: 60 tablet, Refills: 3    thiamine 100 MG tablet Take 1 tablet (100 mg total) by mouth daily. Qty: 30 tablet, Refills: 0      CONTINUE these medications which have NOT CHANGED   Details  amLODipine (NORVASC) 10 MG tablet Take 5 mg by mouth daily.      aspirin 81 MG tablet Take 81 mg by mouth daily.      calcium acetate (PHOSLO) 667 MG capsule Take 667 mg by mouth daily. Take 1 capsule daily with largest meal     insulin glargine (LANTUS) 100 UNIT/ML injection Inject 30 Units into the skin daily.          Disposition and  Follow-up: with PCP in 1 week  Consults:  none  Significant Diagnostic Studies:  Ct Abdomen Pelvis Wo Contrast  10/07/2011  *RADIOLOGY REPORT*  Clinical Data: Diffuse abdominal and pelvic pain.  Pancreatitis. Renal insufficiency.  Diabetes and hypertension.  CT ABDOMEN AND PELVIS WITHOUT CONTRAST  Technique:  Multidetector CT imaging of the abdomen and pelvis was performed following the standard protocol without intravenous contrast.  Comparison: 10/01/2009  Findings: The pancreas has a normal appearance on this noncontrast study.  No evidence of peripancreatic inflammatory changes or fluid collections.  The other abdominal parenchymal organs also have a normal appearance on this noncontrast study.  The gallbladder is unremarkable.  Probable small low attenuation renal cysts remain stable bilaterally.  There is no evidence of hydronephrosis.  No evidence of renal or ureteral calculi.  No soft tissue masses or lymphadenopathy are seen elsewhere within the abdomen or pelvis.  Diverticulosis is again seen, however there is no evidence of diverticulitis.  No other acute inflammatory process or abnormal fluid collections are identified.  Distal esophageal wall thickening is again seen on the study, suspicious for esophagitis.  IMPRESSION:  1.  No CT evidence of pancreatitis. 2.  Distal esophageal wall thickening again noted, suspicious for esophagitis. 3.  Diverticulosis.  No radiographic evidence of diverticulitis.  Original Report Authenticated By: Marlaine Hind, M.D.    Brief H and P: 60 y.o. male  presented with vomiting. The history is provided by the patient, the spouse and a relative. Vomiting started 3 days prior to admission. Patient also reported associated abdominal pain. Pertinent negatives include no chills, no cough, no diarrhea, no fever and no headaches. Patient reports he is still drinking at least 4-5 beers every day. He denied any hemoptysis but had 3 episodes of melena. His abdominal pain,  is mostly epigastric. He had an EGD done by Dr. Evette Doffing schooler, that showed, severe erosive esophagitis affecting the distal two-thirds of the esophagus. He denied a history of peptic ulcer disease, or pancreatitis or abdominal surgery in the past. He denied diarrhea. He denied cough, shortness of breath.   Physical Exam on Discharge:  Filed Vitals:   10/11/11 0607 10/11/11 1619 10/11/11 2200 10/12/11 0638  BP: 180/93 166/83 159/75 168/70  Pulse:  99 91 93  Temp:  99.2 F (37.3 C) 98 F (36.7 C) 98.7 F (37.1 C)  TempSrc:  Oral Oral Oral  Resp:  20 20 20   Height:      Weight:    69.31 kg (152 lb 12.8 oz)  SpO2:  100% 100% 100%     Intake/Output Summary (Last 24 hours) at 10/12/11 1239 Last data filed at 10/12/11 0900  Gross per 24 hour  Intake    960 ml  Output   2250 ml  Net  -1290 ml    General: Alert, awake, oriented x3, in no acute distress. HEENT: No bruits, no goiter. Heart: Regular rate and rhythm, without murmurs, rubs, gallops. Lungs: Clear to auscultation bilaterally. Abdomen: Soft, nontender, nondistended, positive bowel sounds. Extremities: No clubbing cyanosis or edema with positive pedal pulses. Neuro: Grossly intact, nonfocal.  CBC:    Component Value Date/Time   WBC 8.4 10/12/2011 0530   HGB 10.9* 10/12/2011 0530   HCT 31.7* 10/12/2011 0530   PLT 192 10/12/2011 0530   MCV 94.9 10/12/2011 0530   NEUTROABS 12.6* 10/07/2011 1015   LYMPHSABS 0.7 10/07/2011 1015   MONOABS 1.3* 10/07/2011 1015   EOSABS 0.0 10/07/2011 1015   BASOSABS 0.0 10/07/2011 [id number]    Basic Metabolic Panel:    Component Value Date/Time   NA 140 10/12/2011 0530   K 3.3* 10/12/2011 0530   CL 108 10/12/2011 0530   CO2 22 10/12/2011 0530   BUN 10 10/12/2011 0530   CREATININE 2.31* 10/12/2011 0530   GLUCOSE 167* 10/12/2011 0530   CALCIUM 8.2* 10/12/2011 0530    Assessment/Plan:   Principal Problem:   *Nausea and vomiting  - Likely secondary to erosive esophagitis.  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  - Hypoglycemia possibly from poor po intake and worsened renal function  Acute on chronic kidney disease stage III:  - improving  Hypokalemia  - Likely secondary to GI losses  - magnesium level is within normal limits  - resolved   History of alcohol abuse  - continue folic acid and thiamine   DVT prophylaxis:  - SCD boots   EDUCATION  - test results and diagnostic studies were discussed with patient at the bedside  - patient has verbalized the understanding  - questions were answered at the bedside and contact information was provided for additional questions or concerns   Time spent on Discharge: Greater than 30 minutes  Signed: DEVINE, ALMA 10/12/2011, 12:39 PM

## 2014-09-13 ENCOUNTER — Emergency Department (HOSPITAL_COMMUNITY)
Admission: EM | Admit: 2014-09-13 | Discharge: 2014-09-13 | Disposition: A | Discharge status: Home / Self Care | Payer: No Typology Code available for payment source | Attending: Emergency Medicine | Admitting: Emergency Medicine

## 2014-09-13 ENCOUNTER — Encounter (HOSPITAL_COMMUNITY): Payer: Self-pay | Admitting: Emergency Medicine

## 2014-09-13 DIAGNOSIS — Y9389 Activity, other specified: Secondary | ICD-10-CM | POA: Diagnosis not present

## 2014-09-13 DIAGNOSIS — S3992XA Unspecified injury of lower back, initial encounter: Secondary | ICD-10-CM | POA: Insufficient documentation

## 2014-09-13 DIAGNOSIS — Y92410 Unspecified street and highway as the place of occurrence of the external cause: Secondary | ICD-10-CM | POA: Insufficient documentation

## 2014-09-13 DIAGNOSIS — Z87891 Personal history of nicotine dependence: Secondary | ICD-10-CM | POA: Diagnosis not present

## 2014-09-13 DIAGNOSIS — E119 Type 2 diabetes mellitus without complications: Secondary | ICD-10-CM | POA: Diagnosis not present

## 2014-09-13 DIAGNOSIS — I10 Essential (primary) hypertension: Secondary | ICD-10-CM | POA: Diagnosis not present

## 2014-09-13 DIAGNOSIS — Z79899 Other long term (current) drug therapy: Secondary | ICD-10-CM | POA: Insufficient documentation

## 2014-09-13 DIAGNOSIS — S0990XA Unspecified injury of head, initial encounter: Secondary | ICD-10-CM | POA: Diagnosis not present

## 2014-09-13 DIAGNOSIS — Y998 Other external cause status: Secondary | ICD-10-CM | POA: Diagnosis not present

## 2014-09-13 MED ORDER — HYDROCODONE-ACETAMINOPHEN 5-325 MG PO TABS: 1.0000 | ORAL_TABLET | Freq: Four times a day (QID) | ORAL | Status: DC | PRN

## 2014-09-13 MED ORDER — HYDROCODONE-ACETAMINOPHEN 5-325 MG PO TABS
1.0000 | ORAL_TABLET | Freq: Once | ORAL | Status: AC
  Administered 2014-09-13: 1 via ORAL
  Filled 2014-09-13: qty 1

## 2014-09-13 MED ORDER — METHOCARBAMOL 500 MG PO TABS: 500.0000 mg | ORAL_TABLET | Freq: Two times a day (BID) | ORAL | Status: DC

## 2014-09-13 NOTE — Discharge Instructions (Signed)
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
## 2015-12-09 DIAGNOSIS — D631 Anemia in chronic kidney disease: Secondary | ICD-10-CM | POA: Insufficient documentation

## 2015-12-09 DIAGNOSIS — N2581 Secondary hyperparathyroidism of renal origin: Secondary | ICD-10-CM | POA: Insufficient documentation

## 2015-12-09 DIAGNOSIS — N185 Chronic kidney disease, stage 5: Secondary | ICD-10-CM | POA: Insufficient documentation

## 2015-12-09 DIAGNOSIS — N189 Chronic kidney disease, unspecified: Secondary | ICD-10-CM | POA: Insufficient documentation

## 2015-12-09 DIAGNOSIS — Z452 Encounter for adjustment and management of vascular access device: Secondary | ICD-10-CM | POA: Insufficient documentation

## 2015-12-09 DIAGNOSIS — D689 Coagulation defect, unspecified: Secondary | ICD-10-CM | POA: Insufficient documentation

## 2015-12-09 DIAGNOSIS — L299 Pruritus, unspecified: Secondary | ICD-10-CM | POA: Insufficient documentation

## 2015-12-09 DIAGNOSIS — R0602 Shortness of breath: Secondary | ICD-10-CM | POA: Insufficient documentation

## 2015-12-09 DIAGNOSIS — I2729 Other secondary pulmonary hypertension: Secondary | ICD-10-CM | POA: Insufficient documentation

## 2015-12-13 DIAGNOSIS — E8770 Fluid overload, unspecified: Secondary | ICD-10-CM | POA: Insufficient documentation

## 2016-04-02 ENCOUNTER — Encounter (HOSPITAL_COMMUNITY): Payer: Self-pay

## 2016-04-02 ENCOUNTER — Emergency Department (HOSPITAL_COMMUNITY)
Admission: EM | Admit: 2016-04-02 | Discharge: 2016-04-02 | Disposition: A | Discharge status: Home / Self Care | Payer: Medicare PPO | Attending: Emergency Medicine | Admitting: Emergency Medicine

## 2016-04-02 DIAGNOSIS — I10 Essential (primary) hypertension: Secondary | ICD-10-CM | POA: Diagnosis not present

## 2016-04-02 DIAGNOSIS — R55 Syncope and collapse: Secondary | ICD-10-CM | POA: Diagnosis present

## 2016-04-02 DIAGNOSIS — Z79891 Long term (current) use of opiate analgesic: Secondary | ICD-10-CM | POA: Insufficient documentation

## 2016-04-02 DIAGNOSIS — Z7982 Long term (current) use of aspirin: Secondary | ICD-10-CM | POA: Diagnosis not present

## 2016-04-02 DIAGNOSIS — Z87891 Personal history of nicotine dependence: Secondary | ICD-10-CM | POA: Diagnosis not present

## 2016-04-02 DIAGNOSIS — Z79899 Other long term (current) drug therapy: Secondary | ICD-10-CM | POA: Diagnosis not present

## 2016-04-02 DIAGNOSIS — E119 Type 2 diabetes mellitus without complications: Secondary | ICD-10-CM | POA: Insufficient documentation

## 2016-04-02 HISTORY — DX: Disorder of kidney and ureter, unspecified: N28.9

## 2016-04-02 NOTE — Discharge Instructions (Signed)

## 2016-04-02 NOTE — ED Notes (Signed)
Pt from home by [facility] with near syncope. Pt had a full dialysis treatment today and since then pt has felt very weak and tired. Pt original blood pressure was 86/52 sitting and 88/56 standing.  Pt denies chest pain or SOB. Pt alert and oriented x4

## 2016-04-02 NOTE — ED Provider Notes (Signed)
CSN: [id number]     Arrival date & time 04/02/16  1923 History   First MD Initiated Contact with Patient 04/02/16 1925     Chief Complaint  Patient presents with  . Near Syncope     (Consider location/radiation/quality/duration/timing/severity/associated sxs/prior Treatment) HPI   Roger Vazquez is a 65 y.o. male here for evaluation of an episode of near syncope which occurred about 4 PM today. He had his usual dialysis, this morning. He was able to eat lunch afterwards. He called EMS because he felt near syncopal. They found that his blood pressure was low, 86/52. He reports drinking less fluid than usual in the last few days. He has been eating well. He denies fever, chills, cough, shortness of breath, focal weakness or paresthesias. There are no other known modifying factors.   Past Medical History  Diagnosis Date  . Hypertension   . Diabetes mellitus   . Renal disorder    History reviewed. No pertinent past surgical history. No family history on file. Social History  Substance Use Topics  . Smoking status: Former Smoker -- 0.50 packs/day  . Smokeless tobacco: None  . Alcohol Use: 5.4 oz/week    9 Cans of beer per week    Review of Systems  All other systems reviewed and are negative.     Allergies  Review of patient's allergies indicates no known allergies.  Home Medications   Prior to Admission medications   Medication Sig Start Date End Date Taking? Authorizing Provider  amLODipine (NORVASC) 10 MG tablet Take 5 mg by mouth daily.      Historical Provider, MD  aspirin 81 MG tablet Take 81 mg by mouth daily.      Historical Provider, MD  hydrALAZINE (APRESOLINE) 25 MG tablet Take 2 tablets (50 mg total) by mouth every 8 (eight) hours. 10/12/11 10/11/12  Robbie Lis, MD  HYDROcodone-acetaminophen (NORCO/VICODIN) 5-325 MG per tablet Take 1 tablet by mouth every 6 (six) hours as needed for severe pain. 09/13/14   Domenic Moras, PA-C  methocarbamol (ROBAXIN) 500 MG  tablet Take 1 tablet (500 mg total) by mouth 2 (two) times daily. 09/13/14   Domenic Moras, PA-C  Multiple Vitamin (MULITIVITAMIN WITH MINERALS) TABS Take 1 tablet by mouth daily. 10/12/11   Robbie Lis, MD  pantoprazole (PROTONIX) 40 MG tablet Take 1 tablet (40 mg total) by mouth 2 (two) times daily. 10/12/11 10/11/12  Robbie Lis, MD   BP 111/62 mmHg  Pulse 67  Temp(Src) 97.7 F (36.5 C) (Oral)  Resp 18  SpO2 99% Physical Exam  Constitutional: He is oriented to person, place, and time. He appears well-developed and well-nourished.  HENT:  Head: Normocephalic and atraumatic.  Right Ear: External ear normal.  Left Ear: External ear normal.  Eyes: Conjunctivae and EOM are normal. Pupils are equal, round, and reactive to light.  Neck: Normal range of motion and phonation normal. Neck supple.  Cardiovascular: Normal rate, regular rhythm and normal heart sounds.   Dialysis graft left upper arm, with normal thrill. It is not bleeding.  Pulmonary/Chest: Effort normal and breath sounds normal. He exhibits no bony tenderness.  Musculoskeletal: Normal range of motion.  Neurological: He is alert and oriented to person, place, and time. No cranial nerve deficit or sensory deficit. He exhibits normal muscle tone. Coordination normal.  Skin: Skin is warm, dry and intact.  Psychiatric: He has a normal mood and affect. His behavior is normal. Judgment and thought content normal.  Nursing note  and vitals reviewed.   ED Course  Procedures (including critical care time)  Medications - No data to display  Patient Vitals for the past 24 hrs:  BP Temp Temp src Pulse Resp SpO2  04/02/16 2004 111/62 mmHg 97.7 F (36.5 C) Oral 67 18 99 %  04/02/16 2003 111/62 mmHg - - 66 10 100 %  04/02/16 1941 - - - - - 100 %    8:27 PM Reevaluation with update and discussion. After initial assessment and treatment, an updated evaluation reveals Findings discussed with the patient, all questions answered.  [place] Review Labs Reviewed - No data to display  Imaging Review No results found. I have personally reviewed and evaluated these images and lab results as part of my medical decision-making.   EKG Interpretation None      MDM   Final diagnoses:  Near syncope    Near-syncope, postdialysis, without evidence for acute metabolic or infectious process. Blood pressure reassuring in the emergency department. There is no indication for further evaluation or treatment at this time. Suspect mild volume depletion, relative to recent fluid intake. Patient is tolerating oral nutrition and hydration  Nursing Notes Reviewed/ Care Coordinated Applicable Imaging Reviewed Interpretation of Laboratory Data incorporated into ED treatment  The patient appears reasonably screened and/or stabilized for discharge and I doubt any other medical condition or other [facility] requiring further screening, evaluation, or treatment in the ED at this time prior to discharge.  Plan: Home Medications- usual; Home Treatments- rest; return here if the recommended treatment, does not improve the symptoms; Recommended follow up- PCP, Renal prn  Daleen Bo, MD 04/02/16 2033

## 2017-01-14 DIAGNOSIS — R519 Headache, unspecified: Secondary | ICD-10-CM | POA: Insufficient documentation

## 2018-07-27 ENCOUNTER — Inpatient Hospital Stay (HOSPITAL_COMMUNITY)
Admission: EM | Admit: 2018-07-27 | Discharge: 2018-08-02 | DRG: 871 | Disposition: A | Discharge status: Home Health | Payer: Medicare PPO | Attending: Internal Medicine | Admitting: Internal Medicine

## 2018-07-27 ENCOUNTER — Other Ambulatory Visit: Payer: Self-pay

## 2018-07-27 ENCOUNTER — Emergency Department (HOSPITAL_COMMUNITY): Payer: Medicare PPO

## 2018-07-27 DIAGNOSIS — D649 Anemia, unspecified: Secondary | ICD-10-CM | POA: Diagnosis not present

## 2018-07-27 DIAGNOSIS — R4182 Altered mental status, unspecified: Secondary | ICD-10-CM

## 2018-07-27 DIAGNOSIS — E1122 Type 2 diabetes mellitus with diabetic chronic kidney disease: Secondary | ICD-10-CM | POA: Diagnosis present

## 2018-07-27 DIAGNOSIS — K219 Gastro-esophageal reflux disease without esophagitis: Secondary | ICD-10-CM | POA: Diagnosis present

## 2018-07-27 DIAGNOSIS — F1011 Alcohol abuse, in remission: Secondary | ICD-10-CM | POA: Diagnosis present

## 2018-07-27 DIAGNOSIS — E1165 Type 2 diabetes mellitus with hyperglycemia: Secondary | ICD-10-CM | POA: Diagnosis present

## 2018-07-27 DIAGNOSIS — Z87891 Personal history of nicotine dependence: Secondary | ICD-10-CM | POA: Diagnosis not present

## 2018-07-27 DIAGNOSIS — D631 Anemia in chronic kidney disease: Secondary | ICD-10-CM | POA: Diagnosis present

## 2018-07-27 DIAGNOSIS — B1920 Unspecified viral hepatitis C without hepatic coma: Secondary | ICD-10-CM | POA: Diagnosis present

## 2018-07-27 DIAGNOSIS — I10 Essential (primary) hypertension: Secondary | ICD-10-CM | POA: Diagnosis present

## 2018-07-27 DIAGNOSIS — B1710 Acute hepatitis C without hepatic coma: Secondary | ICD-10-CM | POA: Diagnosis present

## 2018-07-27 DIAGNOSIS — B9561 Methicillin susceptible Staphylococcus aureus infection as the cause of diseases classified elsewhere: Secondary | ICD-10-CM

## 2018-07-27 DIAGNOSIS — M898X9 Other specified disorders of bone, unspecified site: Secondary | ICD-10-CM | POA: Diagnosis present

## 2018-07-27 DIAGNOSIS — F1411 Cocaine abuse, in remission: Secondary | ICD-10-CM | POA: Diagnosis present

## 2018-07-27 DIAGNOSIS — A419 Sepsis, unspecified organism: Secondary | ICD-10-CM | POA: Diagnosis not present

## 2018-07-27 DIAGNOSIS — R197 Diarrhea, unspecified: Secondary | ICD-10-CM | POA: Diagnosis present

## 2018-07-27 DIAGNOSIS — R7881 Bacteremia: Secondary | ICD-10-CM | POA: Diagnosis not present

## 2018-07-27 DIAGNOSIS — I120 Hypertensive chronic kidney disease with stage 5 chronic kidney disease or end stage renal disease: Secondary | ICD-10-CM | POA: Diagnosis present

## 2018-07-27 DIAGNOSIS — G92 Toxic encephalopathy: Secondary | ICD-10-CM | POA: Diagnosis present

## 2018-07-27 DIAGNOSIS — Z9114 Patient's other noncompliance with medication regimen: Secondary | ICD-10-CM

## 2018-07-27 DIAGNOSIS — R05 Cough: Secondary | ICD-10-CM | POA: Diagnosis present

## 2018-07-27 DIAGNOSIS — R011 Cardiac murmur, unspecified: Secondary | ICD-10-CM | POA: Diagnosis not present

## 2018-07-27 DIAGNOSIS — Z992 Dependence on renal dialysis: Secondary | ICD-10-CM | POA: Diagnosis not present

## 2018-07-27 DIAGNOSIS — H9192 Unspecified hearing loss, left ear: Secondary | ICD-10-CM | POA: Diagnosis not present

## 2018-07-27 DIAGNOSIS — H5462 Unqualified visual loss, left eye, normal vision right eye: Secondary | ICD-10-CM | POA: Diagnosis not present

## 2018-07-27 DIAGNOSIS — Z8619 Personal history of other infectious and parasitic diseases: Secondary | ICD-10-CM | POA: Diagnosis not present

## 2018-07-27 DIAGNOSIS — N186 End stage renal disease: Secondary | ICD-10-CM

## 2018-07-27 DIAGNOSIS — A4101 Sepsis due to Methicillin susceptible Staphylococcus aureus: Secondary | ICD-10-CM | POA: Diagnosis present

## 2018-07-27 LAB — COMPREHENSIVE METABOLIC PANEL
ALBUMIN: 3.4 g/dL — AB (ref 3.5–5.0)
ALK PHOS: 30 U/L — AB (ref 38–126)
ALT: 11 U/L (ref 0–44)
ALT: 22 U/L (ref 0–44)
ANION GAP: 17 — AB (ref 5–15)
AST: 20 U/L (ref 15–41)
AST: 42 U/L — AB (ref 15–41)
Albumin: 1.5 g/dL — ABNORMAL LOW (ref 3.5–5.0)
Alkaline Phosphatase: 66 U/L (ref 38–126)
Anion gap: 7 (ref 5–15)
BUN: 52 mg/dL — AB (ref 8–23)
BUN: 101 mg/dL — AB (ref 8–23)
CALCIUM: UNDETERMINED mg/dL (ref 8.9–10.3)
CHLORIDE: 96 mmol/L — AB (ref 98–111)
CO2: 9 mmol/L — ABNORMAL LOW (ref 22–32)
CO2: 18 mmol/L — ABNORMAL LOW (ref 22–32)
CREATININE: 6.09 mg/dL — AB (ref 0.61–1.24)
Calcium: 8 mg/dL — ABNORMAL LOW (ref 8.9–10.3)
Chloride: 125 mmol/L — ABNORMAL HIGH (ref 98–111)
Creatinine, Ser: 12.78 mg/dL — ABNORMAL HIGH (ref 0.61–1.24)
GFR calc Af Amer: 4 mL/min — ABNORMAL LOW (ref >60)
GFR, EST AFRICAN AMERICAN: 10 mL/min — AB (ref >60)
GFR, EST NON AFRICAN AMERICAN: 8 mL/min — AB (ref >60)
GFR, EST NON AFRICAN AMERICAN: 3 mL/min — AB (ref >60)
Glucose, Bld: 202 mg/dL — ABNORMAL HIGH (ref 70–99)
Glucose, Bld: 327 mg/dL — ABNORMAL HIGH (ref 70–99)
POTASSIUM: 3.8 mmol/L (ref 3.5–5.1)
Potassium: UNDETERMINED mmol/L (ref 3.5–5.1)
Sodium: 141 mmol/L (ref 135–145)
Sodium: 131 mmol/L — ABNORMAL LOW (ref 135–145)
TOTAL PROTEIN: 7.2 g/dL (ref 6.5–8.1)
Total Bilirubin: 0.5 mg/dL (ref 0.3–1.2)
Total Bilirubin: 0.8 mg/dL (ref 0.3–1.2)
Total Protein: 3.3 g/dL — ABNORMAL LOW (ref 6.5–8.1)

## 2018-07-27 LAB — CBC WITH DIFFERENTIAL/PLATELET
ABS IMMATURE GRANULOCYTES: 0.07 10*3/uL (ref 0.00–0.07)
BASOS ABS: 0 10*3/uL (ref 0.0–0.1)
BASOS PCT: 0 %
EOS ABS: 0 10*3/uL (ref 0.0–0.5)
EOS PCT: 0 %
HCT: 35.2 % — ABNORMAL LOW (ref 39.0–52.0)
Hemoglobin: 10.7 g/dL — ABNORMAL LOW (ref 13.0–17.0)
Immature Granulocytes: 1 %
LYMPHS PCT: 6 %
Lymphs Abs: 0.5 10*3/uL — ABNORMAL LOW (ref 0.7–4.0)
MCH: 34 pg (ref 26.0–34.0)
MCHC: 30.4 g/dL (ref 30.0–36.0)
MCV: 111.7 fL — AB (ref 80.0–100.0)
Monocytes Absolute: 0.3 10*3/uL (ref 0.1–1.0)
Monocytes Relative: 3 %
NRBC: 0 % (ref 0.0–0.2)
Neutro Abs: 7.8 10*3/uL — ABNORMAL HIGH (ref 1.7–7.7)
Neutrophils Relative %: 90 %
PLATELETS: 106 10*3/uL — AB (ref 150–400)
RBC: 3.15 MIL/uL — AB (ref 4.22–5.81)
RDW: 15.2 % (ref 11.5–15.5)
WBC Morphology: ABNORMAL
WBC: 8.7 10*3/uL (ref 4.0–10.5)

## 2018-07-27 LAB — I-STAT CHEM 8, ED
BUN: 105 mg/dL — ABNORMAL HIGH (ref 8–23)
CALCIUM ION: 0.96 mmol/L — AB (ref 1.15–1.40)
CREATININE: 14 mg/dL — AB (ref 0.61–1.24)
Chloride: 96 mmol/L — ABNORMAL LOW (ref 98–111)
Glucose, Bld: 331 mg/dL — ABNORMAL HIGH (ref 70–99)
HEMATOCRIT: 35 % — AB (ref 39.0–52.0)
HEMOGLOBIN: 11.9 g/dL — AB (ref 13.0–17.0)
Potassium: 3.9 mmol/L (ref 3.5–5.1)
SODIUM: 132 mmol/L — AB (ref 135–145)
TCO2: 21 mmol/L — AB (ref 22–32)

## 2018-07-27 LAB — INFLUENZA PANEL BY PCR (TYPE A & B)
Influenza A By PCR: NEGATIVE
Influenza B By PCR: NEGATIVE

## 2018-07-27 LAB — PROTIME-INR
INR: 0.99
Prothrombin Time: 13 seconds (ref 11.4–15.2)

## 2018-07-27 LAB — I-STAT CG4 LACTIC ACID, ED
Lactic Acid, Venous: 2.66 mmol/L (ref 0.5–1.9)
Lactic Acid, Venous: 0.94 mmol/L (ref 0.5–1.9)

## 2018-07-27 MED ORDER — ALBUTEROL SULFATE (2.5 MG/3ML) 0.083% IN NEBU: 2.5000 mg | INHALATION_SOLUTION | RESPIRATORY_TRACT | Status: DC | PRN

## 2018-07-27 MED ORDER — ACETAMINOPHEN 650 MG RE SUPP
650.0000 mg | RECTAL | Status: DC | PRN
  Filled 2018-07-27: qty 1

## 2018-07-27 MED ORDER — METHOCARBAMOL 500 MG PO TABS: 500.0000 mg | ORAL_TABLET | Freq: Two times a day (BID) | ORAL | Status: DC

## 2018-07-27 MED ORDER — METRONIDAZOLE IN NACL 5-0.79 MG/ML-% IV SOLN
500.0000 mg | Freq: Three times a day (TID) | INTRAVENOUS | Status: DC
  Administered 2018-07-27 – 2018-07-28 (×3): 500 mg via INTRAVENOUS
  Filled 2018-07-27 (×4): qty 100

## 2018-07-27 MED ORDER — ADULT MULTIVITAMIN W/MINERALS CH
1.0000 | ORAL_TABLET | Freq: Every day | ORAL | Status: DC
  Administered 2018-07-28 – 2018-08-02 (×5): 1 via ORAL
  Filled 2018-07-27 (×6): qty 1

## 2018-07-27 MED ORDER — SODIUM CHLORIDE 0.9 % IV SOLN: 2.0000 g | INTRAVENOUS | Status: DC

## 2018-07-27 MED ORDER — SODIUM CHLORIDE 0.9 % IV SOLN
1000.0000 mL | INTRAVENOUS | Status: DC
  Administered 2018-07-27 (×2): 1000 mL via INTRAVENOUS

## 2018-07-27 MED ORDER — ACETAMINOPHEN 325 MG PO TABS
650.0000 mg | ORAL_TABLET | Freq: Once | ORAL | Status: AC
  Administered 2018-07-27: 650 mg via ORAL
  Filled 2018-07-27: qty 2

## 2018-07-27 MED ORDER — VANCOMYCIN HCL IN DEXTROSE 750-5 MG/150ML-% IV SOLN
750.0000 mg | INTRAVENOUS | Status: DC
  Filled 2018-07-27: qty 150

## 2018-07-27 MED ORDER — SODIUM CHLORIDE 0.9 % IV SOLN
2.0000 g | Freq: Once | INTRAVENOUS | Status: AC
  Administered 2018-07-27: 2 g via INTRAVENOUS
  Filled 2018-07-27: qty 2

## 2018-07-27 MED ORDER — ASPIRIN EC 81 MG PO TBEC
81.0000 mg | DELAYED_RELEASE_TABLET | Freq: Every day | ORAL | Status: DC
  Administered 2018-07-28 – 2018-08-02 (×5): 81 mg via ORAL
  Filled 2018-07-27 (×6): qty 1

## 2018-07-27 MED ORDER — ALBUTEROL SULFATE (2.5 MG/3ML) 0.083% IN NEBU
2.5000 mg | INHALATION_SOLUTION | Freq: Four times a day (QID) | RESPIRATORY_TRACT | Status: DC
  Administered 2018-07-27 – 2018-07-28 (×3): 2.5 mg via RESPIRATORY_TRACT
  Filled 2018-07-27 (×3): qty 3

## 2018-07-27 MED ORDER — ONDANSETRON HCL 4 MG PO TABS: 4.0000 mg | ORAL_TABLET | Freq: Four times a day (QID) | ORAL | Status: DC | PRN

## 2018-07-27 MED ORDER — SODIUM CHLORIDE 0.9% FLUSH: 3.0000 mL | INTRAVENOUS | Status: DC | PRN

## 2018-07-27 MED ORDER — PANTOPRAZOLE SODIUM 40 MG PO TBEC
40.0000 mg | DELAYED_RELEASE_TABLET | Freq: Two times a day (BID) | ORAL | Status: DC
  Administered 2018-07-28 – 2018-08-02 (×10): 40 mg via ORAL
  Filled 2018-07-27 (×12): qty 1

## 2018-07-27 MED ORDER — VANCOMYCIN HCL IN DEXTROSE 1-5 GM/200ML-% IV SOLN
1000.0000 mg | Freq: Once | INTRAVENOUS | Status: AC
  Administered 2018-07-27: 1000 mg via INTRAVENOUS
  Filled 2018-07-27: qty 200

## 2018-07-27 MED ORDER — ONDANSETRON HCL 4 MG/2ML IJ SOLN
4.0000 mg | Freq: Four times a day (QID) | INTRAMUSCULAR | Status: DC | PRN
  Administered 2018-07-28: 4 mg via INTRAVENOUS
  Filled 2018-07-27: qty 2

## 2018-07-27 MED ORDER — SODIUM CHLORIDE 0.9 % IV SOLN: 250.0000 mL | INTRAVENOUS | Status: DC | PRN

## 2018-07-27 MED ORDER — SODIUM CHLORIDE 0.9% FLUSH
3.0000 mL | Freq: Two times a day (BID) | INTRAVENOUS | Status: DC
  Administered 2018-07-28 – 2018-08-01 (×9): 3 mL via INTRAVENOUS

## 2018-07-27 MED ORDER — HEPARIN SODIUM (PORCINE) 5000 UNIT/ML IJ SOLN
5000.0000 [IU] | Freq: Three times a day (TID) | INTRAMUSCULAR | Status: DC
  Administered 2018-07-28 – 2018-08-02 (×16): 5000 [IU] via SUBCUTANEOUS
  Filled 2018-07-27 (×15): qty 1

## 2018-07-27 NOTE — ED Triage Notes (Signed)
To ED via [facility] from home with c/o fever and altered mental status- on arrival pt has continued hiccups, and rectal temp of 104.5-  IV per EMS in right hand Dialysis pt - last treatment on Friday- family states that he has gotten progressively sicker since then.

## 2018-07-27 NOTE — ED Notes (Addendum)
Sisters--  [name]-- [place]-- [place] with bed assignment

## 2018-07-27 NOTE — ED Notes (Signed)
Pt has had NO episodes of diarrhea since arrival to ED. Resting comfortably.

## 2018-07-27 NOTE — Progress Notes (Signed)
Pharmacy Antibiotic Note  Roger Vazquez is a 67 y.o. male admitted on 07/27/2018 with sepsis.  Pharmacy has been consulted for vancomycin and cefepime dosing.  ESRD. Last HD treatment was on Friday.  Plan: Cefepime 2g IV x 1, then start cefepime 2g IV QHD-MWF Give vancomycin 1g IV x 1, then start vancomycin 750mg  IV QHD-MWF Start Flagyl 500mg  IV Q8h Monitor clinical picture, HD sessions, preHD VR prn F/U C&S, abx deescalation / LOT   Weight: 138 lb 0.1 oz (62.6 kg)  Temp (24hrs), Avg:104.5 F (40.3 C), Min:104.5 F (40.3 C), Max:104.5 F (40.3 C)  No results for input(s): WBC, CREATININE, LATICACIDVEN, VANCOTROUGH, VANCOPEAK, VANCORANDOM, GENTTROUGH, GENTPEAK, GENTRANDOM, TOBRATROUGH, TOBRAPEAK, TOBRARND, AMIKACINPEAK, AMIKACINTROU, AMIKACIN in the last 168 hours.  CrCl cannot be calculated (Patient's most recent lab result is older than the maximum 21 days allowed.).    No Known Allergies  Thank you for allowing pharmacy to be a part of this patient's care.  Reginia Naas 07/27/2018 11:22 AM

## 2018-07-27 NOTE — H&P (Addendum)
[facility]                                                                                    Patient Demographics  Roger Vazquez, is a 66 y.o. male  CSN: [id number]  MRN: [id number]  DOB - 1951/09/05  Admit Date - 07/27/2018  Outpatient Primary MD for the patient is [facility], [facility]   With History of -  Past Medical History:  Diagnosis Date  . Diabetes mellitus   . Hypertension   . Renal disorder       No past surgical history on file.  in for   Chief Complaint  Patient presents with  . Altered/Sepsis     HPI  Roger Vazquez  is a 67 y.o. male, with past medical history significant for end-stage renal disease on hemodialysis who was seen good last on Friday, sent to our emergency room for evaluation due to altered mental status and fever.  Patient was found covered with stools.  Noncontributory.  His blood pressure was noted to be soft and he received 500 mL normal saline and was started on vancomycin and cefepime and Flagyl in the emergency room.  I could not wake him up completely but noticed that he was coughing.  Discussed with his sister Roger Vazquez [phone] who told me that his roommate called them today due to altered mental status.  In the emergency room he was noted to be febrile.    Review of Systems    Unable to obtain due to patient's condition  Social History Social History   Tobacco Use  . Smoking status: Former Smoker    Packs/day: 0.50  Substance Use Topics  . Alcohol use: Yes    Alcohol/week: 9.0 standard drinks    Types: 9 Cans of beer per week     Family History No family history on file.   Prior to Admission medications   Medication Sig Start Date End Date Taking? Authorizing Provider  amLODipine (NORVASC) 10 MG tablet Take 5 mg by mouth daily.      [provider]  aspirin 81 MG tablet Take 81 mg by mouth daily.      [provider]  hydrALAZINE (APRESOLINE) 25 MG tablet Take 2 tablets (50 mg  total) by mouth every 8 (eight) hours. 10/12/11 10/11/12  Robbie Lis, MD  HYDROcodone-acetaminophen (NORCO/VICODIN) 5-325 MG per tablet Take 1 tablet by mouth every 6 (six) hours as needed for severe pain. 09/13/14   Domenic Moras, PA-C  methocarbamol (ROBAXIN) 500 MG tablet Take 1 tablet (500 mg total) by mouth 2 (two) times daily. 09/13/14   Domenic Moras, PA-C  Multiple Vitamin (MULITIVITAMIN WITH MINERALS) TABS Take 1 tablet by mouth daily. 10/12/11   Robbie Lis, MD  pantoprazole (PROTONIX) 40 MG tablet Take 1 tablet (40 mg total) by mouth 2 (two) times daily. 10/12/11 10/11/12  Robbie Lis, MD    No Known Allergies  Physical Exam  Vitals  Blood pressure (!) 119/54, pulse (!) 102, temperature (S) (!) 102.8 F (39.3 C), temperature source (S) Oral, resp. rate (!) 36, weight 62.6 kg, SpO2 97 %.   1. General well  deveped , lethargic  2. Normal affect and insight, Not Suicidal or Homicidal,noncontributory.  3. No F.N deficits, ALL C.Nerves Intact, Strength 5/5 all 4 extremities, Sensation intact all 4 extremities, Plantars down going.  4. Ears and Eyes appear Normal, Conjunctivae clear, PERRLA. dryOral Mucosa.  5. Supple Neck, No JVD, No cervical lymphadenopathy appriciated, No Carotid Bruits.  6. Symmetrical Chest wall movement, decreased insp effort.  7. tachycardic, No Gallops, Rubs or Murmurs, No Parasternal Heave.  8. Positive Bowel Sounds, Abdomen Soft, Non tender, No organomegaly appriciated,No rebound -guarding or rigidity.  9.  No Cyanosis, Normal Skin Turgor, No Skin Rash or Bruise.  10. Good muscle tone,  joints appear normal , no effusions, Normal ROM.    Data Review  CBC Recent Labs  Lab 07/27/18 1116 07/27/18 1449  WBC 8.7  --   HGB 10.7* 11.9*  HCT 35.2* 35.0*  PLT 106*  --   MCV 111.7*  --   MCH 34.0  --   MCHC 30.4  --   RDW 15.2  --   LYMPHSABS 0.5*  --   MONOABS 0.3  --   EOSABS 0.0  --   BASOSABS 0.0  --     ------------------------------------------------------------------------------------------------------------------  Chemistries  Recent Labs  Lab 07/27/18 1357 07/27/18 1449  NA 141 132*  K UNABLE TO OBTAIN RESULT APPEARS EDTA CONTAMINANT 3.9  CL 125* 96*  CO2 9*  --   GLUCOSE 202* 331*  BUN 52* 105*  CREATININE 6.09* 14.00*  CALCIUM  UNABLE TO OBTAIN RESULTS APPEARS EDTA CONTAMINANT  --   AST 20  --   ALT 11  --   ALKPHOS 30*  --   BILITOT 0.5  --    ------------------------------------------------------------------------------------------------------------------ CrCl cannot be calculated (Unknown ideal weight.). ------------------------------------------------------------------------------------------------------------------ No results for input(s): TSH, T4TOTAL, T3FREE, THYROIDAB in the last 72 hours.  Invalid input(s): FREET3   Coagulation profile Recent Labs  Lab 07/27/18 1116  INR 0.99   ------------------------------------------------------------------------------------------------------------------- No results for input(s): DDIMER in the last 72 hours. -------------------------------------------------------------------------------------------------------------------  Cardiac Enzymes No results for input(s): CKMB, TROPONINI, MYOGLOBIN in the last 168 hours.  Invalid input(s): CK ------------------------------------------------------------------------------------------------------------------ Invalid input(s): POCBNP   ---------------------------------------------------------------------------------------------------------------  Urinalysis    Component Value Date/Time   COLORURINE YELLOW 10/07/2011 1618   APPEARANCEUR CLEAR 10/07/2011 1618   LABSPEC 1.016 10/07/2011 1618   PHURINE 5.0 10/07/2011 1618   GLUCOSEU 500 (A) 10/07/2011 1618   HGBUR MODERATE (A) 10/07/2011 1618   BILIRUBINUR NEGATIVE 10/07/2011 1618   KETONESUR NEGATIVE 10/07/2011 1618    PROTEINUR >300 (A) 10/07/2011 1618   UROBILINOGEN 0.2 10/07/2011 1618   NITRITE NEGATIVE 10/07/2011 1618   LEUKOCYTESUR NEGATIVE 10/07/2011 1618    ----------------------------------------------------------------------------------------------------------------   Imaging results:   Dg Chest Portable 1 View  Result Date: 07/27/2018 CLINICAL DATA:  Sepsis. Fever and altered mental status. Hypertension and diabetes. Ex-smoker. EXAM: PORTABLE CHEST 1 VIEW COMPARISON:  02/14/2011 FINDINGS: Left axillary vascular stent. Numerous leads and wires project over the chest. Midline trachea. Normal heart size. Atherosclerosis in the transverse aorta. No pleural effusion or pneumothorax. Mild left hemidiaphragm elevation. Clear lungs. Interval but nonacute left rib deformities could be posttraumatic or postsurgical. IMPRESSION: No acute findings. Aortic Atherosclerosis (ICD10-I70.0). Electronically Signed   By: Abigail Miyamoto M.D.   On: 07/27/2018 12:10    My personal review of EKG: Sinus tachycardia at 121 bpm with LVH and no acute changes  Assessment & Plan  Sepsis Flu test is negative /C. difficile is still pending Continue with  Vanco, cefepime and Flagyl Status post normal saline 500 mL in the emergency room, blood pressure picking up slowly  End-stage renal disease on hemodialysis Monday Wednesday and Friday Consult nephrology in a.m. for dialysis.  Patient is usually followed at the [facility]  Diarrhea Check C. difficile and start Flagyl, awaiting C. difficile  History of hypertension hold hydralazine and Norvasc , blood pressure is soft  Goals of care discussed with sister.  Patient has grave prognosis.  DVT Prophylaxis Heparin   AM Labs Ordered, also please review Full Orders  Family Communication: D/W sister Donita.  Code Status full  Disposition Plan: undetermined  Time spent in minutes : 45 min  Condition critical   @SIGNATURE @

## 2018-07-27 NOTE — ED Provider Notes (Signed)
[name]   CSN: [id number] Arrival date & time: 07/27/18  1031     History   Chief Complaint Chief Complaint  Patient presents with  . Altered/Sepsis    HPI Roger Vazquez is a 67 y.o. male.  HPI Patient is here with his sisters who are main historians.  Patient reportedly went to dialysis on Friday.  This morning, his sister reports that the patient's roommate called her to say that he did not look well.  Patient was found to be febrile and confused.  Sisters report they went over tried to talk to him he did not answer any questions with a meaningful answer.  EMS was called.  Family members are under the assumption that he was reasonably well going into dialysis on Friday.  Patient is an extremely limited historian.  He is awake but not answering questions.  He denies he has any pain.  However if I pushed into his abdomen he slightly endorses pain.  Patient has been repeated hiccuping.  His one sister reports that has not atypical.  It happens after he eats or other times sometimes spontaneously.  Patient does have dried stool on his feet.  One sister notes that when she went to the house to check on him today there was diarrheal stool in the bathroom.  Patient has not had frequent hospitalization.  Patient's sister estimates the last hospitalization was about 2 years ago at the [place].  Currently not much more history available. Past Medical History:  Diagnosis Date  . Diabetes mellitus   . Hypertension   . Renal disorder     Patient Active Problem List   Diagnosis Date Noted  . Abdominal pain, other specified site 10/07/2011  . Nausea and vomiting 10/07/2011  . VITAMIN D DEFICIENCY 03/19/2008  . FRACTURE, TIBIA 02/09/2008  . HYPERTENSION 11/25/2007  . HEPATITIS C 05/27/2007  . DIABETES MELLITUS, TYPE II 05/27/2007  . ERECTILE DYSFUNCTION 05/27/2007  . ABUSE, ALCOHOL, IN REMISSION 05/27/2007  . ABUSE, OPIOID, IN REMISSION  05/27/2007  . ABUSE, COCAINE, IN REMISSION 05/27/2007    No past surgical history on file.      Home Medications    Prior to Admission medications   Medication Sig Start Date End Date Taking? Authorizing Provider  amLODipine (NORVASC) 10 MG tablet Take 5 mg by mouth daily.      [provider]  aspirin 81 MG tablet Take 81 mg by mouth daily.      [provider]  hydrALAZINE (APRESOLINE) 25 MG tablet Take 2 tablets (50 mg total) by mouth every 8 (eight) hours. 10/12/11 10/11/12  Robbie Lis, MD  HYDROcodone-acetaminophen (NORCO/VICODIN) 5-325 MG per tablet Take 1 tablet by mouth every 6 (six) hours as needed for severe pain. 09/13/14   Domenic Moras, PA-C  methocarbamol (ROBAXIN) 500 MG tablet Take 1 tablet (500 mg total) by mouth 2 (two) times daily. 09/13/14   Domenic Moras, PA-C  Multiple Vitamin (MULITIVITAMIN WITH MINERALS) TABS Take 1 tablet by mouth daily. 10/12/11   Robbie Lis, MD  pantoprazole (PROTONIX) 40 MG tablet Take 1 tablet (40 mg total) by mouth 2 (two) times daily. 10/12/11 10/11/12  Robbie Lis, MD    Family History No family history on file.  Social History Social History   Tobacco Use  . Smoking status: Former Smoker    Packs/day: 0.50  Substance Use Topics  . Alcohol use: Yes    Alcohol/week: 9.0 standard  drinks    Types: 9 Cans of beer per week  . Drug use: Yes    Types: Marijuana     Allergies   Patient has no known allergies.   Review of Systems Review of Systems Level 5 caveat cannot obtain review of systems due to patient confusion.  Physical Exam Updated Vital Signs BP 138/66   Pulse (!) 109   Temp (S) (!) 102.8 F (39.3 C) (Oral)   Resp 18   Wt 62.6 kg   SpO2 100%   BMI 22.28 kg/m   Physical Exam  Constitutional:  Patient is sitting up in the stretcher and has an alert appearance.  He is recurrently hiccuping.  He does not have significant respiratory distress.  Patient is very thin.  HENT:  Head:  Normocephalic and atraumatic.  His membranes are slightly dry.  Posterior oropharynx is widely patent.  Eyes: Pupils are equal, round, and reactive to light. EOM are normal.  Neck: Neck supple.  Cardiovascular:  Tachycardia.  No gross rub murmur gallop.  Pulmonary/Chest:  No respiratory distress.  Breath sounds grossly clear.  Will repeat auscultation once less activity in the room and ancillary noise.  Abdominal:  Abdomen is mildly distended.  Soft without guarding.  When I asked the patient if it is painful when I palpate, he equivocally endorses pain.  His general expression does not seem to change much.  Genitourinary:  Genitourinary Comments: Grossly normal genital area without any scrotal swelling or significant intertriginous rashes.  Musculoskeletal:  Extremities are extremely thin.  No peripheral edema.  No wounds on the feet.  Patient does have some dried stool on the soles of the feet.  Neurological:  Patient has alert appearance.  He is very limited in his verbal answers.  He does occasionally use a word or 2.  He mostly grunts in the affirmative or negative.  He can move both upper extremities at will.  Both lower extremities move it well.  Skin: Skin is warm and dry.  Skin hot and dry to touch     ED Treatments / Results  Labs (all labs ordered are listed, but only abnormal results are displayed) Labs Reviewed  CBC WITH DIFFERENTIAL/PLATELET - Abnormal; Notable for the following components:      Result Value   RBC 3.15 (*)    Hemoglobin 10.7 (*)    HCT 35.2 (*)    MCV 111.7 (*)    Platelets 106 (*)    Neutro Abs 7.8 (*)    Lymphs Abs 0.5 (*)    All other components within normal limits  COMPREHENSIVE METABOLIC PANEL - Abnormal; Notable for the following components:   Chloride 125 (*)    CO2 9 (*)    Glucose, Bld 202 (*)    BUN 52 (*)    Creatinine, Ser 6.09 (*)    Total Protein 3.3 (*)    Albumin 1.5 (*)    Alkaline Phosphatase 30 (*)    GFR calc non Af Amer  8 (*)    GFR calc Af Amer 10 (*)    All other components within normal limits  I-STAT CG4 LACTIC ACID, ED - Abnormal; Notable for the following components:   Lactic Acid, Venous 2.66 (*)    All other components within normal limits  I-STAT CHEM 8, ED - Abnormal; Notable for the following components:   Sodium 132 (*)    Chloride 96 (*)    BUN 105 (*)    Creatinine, Ser 14.00 (*)  Glucose, Bld 331 (*)    Calcium, Ion 0.96 (*)    TCO2 21 (*)    Hemoglobin 11.9 (*)    HCT 35.0 (*)    All other components within normal limits  CULTURE, BLOOD (ROUTINE X 2)  CULTURE, BLOOD (ROUTINE X 2)  PROTIME-INR  URINALYSIS, ROUTINE W REFLEX MICROSCOPIC  INFLUENZA PANEL BY PCR (TYPE A & B)  I-STAT CG4 LACTIC ACID, ED  I-STAT CG4 LACTIC ACID, ED  I-STAT CG4 LACTIC ACID, ED    EKG EKG Interpretation  Date/Time:  Sunday July 27 2018 10:43:35 EDT Ventricular Rate:  121 PR Interval:    QRS Duration: 75 QT Interval:  365 QTC Calculation: 518 R Axis:   60 Text Interpretation:  Sinus tachycardia similar to old tracings Confirmed by Charlesetta Shanks [phone]) on 07/27/2018 2:02:42 PM   Radiology Dg Chest Portable 1 View  Result Date: 07/27/2018 CLINICAL DATA:  Sepsis. Fever and altered mental status. Hypertension and diabetes. Ex-smoker. EXAM: PORTABLE CHEST 1 VIEW COMPARISON:  02/14/2011 FINDINGS: Left axillary vascular stent. Numerous leads and wires project over the chest. Midline trachea. Normal heart size. Atherosclerosis in the transverse aorta. No pleural effusion or pneumothorax. Mild left hemidiaphragm elevation. Clear lungs. Interval but nonacute left rib deformities could be posttraumatic or postsurgical. IMPRESSION: No acute findings. Aortic Atherosclerosis (ICD10-I70.0). Electronically Signed   By: Abigail Miyamoto M.D.   On: 07/27/2018 12:10    Procedures Procedures (including critical care time) CRITICAL CARE Performed by: Charlesetta Shanks   Total critical care time: 30  minutes  Critical care time was exclusive of separately billable procedures and treating other patients.  Critical care was necessary to treat or prevent imminent or life-threatening deterioration.  Critical care was time spent personally by me on the following activities: development of treatment plan with patient and/or surrogate as well as nursing, discussions with consultants, evaluation of patient's response to treatment, examination of patient, obtaining history from patient or surrogate, ordering and performing treatments and interventions, ordering and review of laboratory studies, ordering and review of radiographic studies, pulse oximetry and re-evaluation of patient's condition.Medications Ordered in ED Medications  metroNIDAZOLE (FLAGYL) IVPB 500 mg (0 mg Intravenous Stopped 07/27/18 1257)  0.9 %  sodium chloride infusion (1,000 mLs Intravenous New Bag/Given 07/27/18 1135)  ceFEPIme (MAXIPIME) 2 g in sodium chloride 0.9 % 100 mL IVPB (has no administration in time range)  vancomycin (VANCOCIN) IVPB 750 mg/150 ml premix (has no administration in time range)  acetaminophen (TYLENOL) tablet 650 mg (650 mg Oral Given 07/27/18 1110)  ceFEPIme (MAXIPIME) 2 g in sodium chloride 0.9 % 100 mL IVPB (0 g Intravenous Stopped 07/27/18 1257)  vancomycin (VANCOCIN) IVPB 1000 mg/200 mL premix (0 mg Intravenous Stopped 07/27/18 1427)     Initial Impression / Assessment and Plan / ED Course  I have reviewed the triage vital signs and the nursing notes.  Pertinent labs & imaging results that were available during my care of the patient were reviewed by me and considered in my medical decision making (see chart for details).  Clinical Course as of Jul 27 1528  Nancy Fetter Jul 27, 6502  5465 Metabolic panel has not yet resulted.  Unclear what delay is.  Have tried calling main lab but no answer.   [MP]  1512 Consult ordered for nephrology and unassigned admission at 1501   [MP]  71 Consult: Notify Dr.  Melvia Heaps of patient's admission.  Advises for medicine admitting service to place consult as needed as patient does not  need emergent dialysis at this time.   [MP]  1527 Consult: Tried hospitalist has returned to call for admission.   [MP]    Clinical Course User Index [MP] Charlesetta Shanks, MD   Patient presents with fever to 104.  He has significant comorbid illness.  History is very limited.  Patient had last dialysis Friday, day before yesterday.  Today his roommate and family members note significant confusion.  Patient is awake and alert.  Patient is not currently hyperkalemic or volume overloaded.  Sepsis protocol initiated.  Fluids used in increments for blood pressure support rather than 30 cc/kg bolus.  Patient has tolerated 500 cc of fluids with maintenance rate without respiratory distress.  Her pressures have improved and are normocephalic.  On physical exam patient notably had dried diarrheal appearing stool on his feet.  Consideration is for diarrheal illness, possible influenza, possible bacterial of unknown etiology.  [facility] for ongoing diagnostic evaluation and treatment.  Final Clinical Impressions(s) / ED Diagnoses   Final diagnoses:  Sepsis, due to unspecified organism, unspecified whether acute organ dysfunction present ([place])  ESRD (end stage renal disease) on dialysis ([place])  Altered mental status, unspecified altered mental status type    ED Discharge Orders    None       Charlesetta Shanks, MD 07/27/18 [phone]

## 2018-07-28 ENCOUNTER — Inpatient Hospital Stay (HOSPITAL_COMMUNITY): Payer: Medicare PPO

## 2018-07-28 DIAGNOSIS — Z87891 Personal history of nicotine dependence: Secondary | ICD-10-CM

## 2018-07-28 DIAGNOSIS — R7881 Bacteremia: Secondary | ICD-10-CM

## 2018-07-28 DIAGNOSIS — R197 Diarrhea, unspecified: Secondary | ICD-10-CM

## 2018-07-28 DIAGNOSIS — R011 Cardiac murmur, unspecified: Secondary | ICD-10-CM

## 2018-07-28 DIAGNOSIS — E1122 Type 2 diabetes mellitus with diabetic chronic kidney disease: Secondary | ICD-10-CM

## 2018-07-28 DIAGNOSIS — Z992 Dependence on renal dialysis: Secondary | ICD-10-CM

## 2018-07-28 DIAGNOSIS — Z8619 Personal history of other infectious and parasitic diseases: Secondary | ICD-10-CM

## 2018-07-28 DIAGNOSIS — N186 End stage renal disease: Secondary | ICD-10-CM

## 2018-07-28 DIAGNOSIS — B9561 Methicillin susceptible Staphylococcus aureus infection as the cause of diseases classified elsewhere: Secondary | ICD-10-CM

## 2018-07-28 LAB — BLOOD CULTURE ID PANEL (REFLEXED)
Acinetobacter baumannii: NOT DETECTED
CANDIDA PARAPSILOSIS: NOT DETECTED
CANDIDA TROPICALIS: NOT DETECTED
Candida albicans: NOT DETECTED
Candida glabrata: NOT DETECTED
Candida krusei: NOT DETECTED
Enterobacter cloacae complex: NOT DETECTED
Enterobacteriaceae species: NOT DETECTED
Enterococcus species: NOT DETECTED
Escherichia coli: NOT DETECTED
HAEMOPHILUS INFLUENZAE: NOT DETECTED
KLEBSIELLA PNEUMONIAE: NOT DETECTED
Klebsiella oxytoca: NOT DETECTED
Listeria monocytogenes: NOT DETECTED
METHICILLIN RESISTANCE: NOT DETECTED
Neisseria meningitidis: NOT DETECTED
PROTEUS SPECIES: NOT DETECTED
Pseudomonas aeruginosa: NOT DETECTED
SERRATIA MARCESCENS: NOT DETECTED
STAPHYLOCOCCUS AUREUS BCID: DETECTED — AB
STAPHYLOCOCCUS SPECIES: DETECTED — AB
STREPTOCOCCUS PNEUMONIAE: NOT DETECTED
Streptococcus agalactiae: NOT DETECTED
Streptococcus pyogenes: NOT DETECTED
Streptococcus species: NOT DETECTED

## 2018-07-28 LAB — HEMOGLOBIN A1C
HEMOGLOBIN A1C: 8.2 % — AB (ref 4.8–5.6)
Mean Plasma Glucose: 188.64 mg/dL

## 2018-07-28 LAB — BASIC METABOLIC PANEL
Anion gap: 17 — ABNORMAL HIGH (ref 5–15)
BUN: 107 mg/dL — AB (ref 8–23)
CO2: 16 mmol/L — ABNORMAL LOW (ref 22–32)
CREATININE: 13.66 mg/dL — AB (ref 0.61–1.24)
Calcium: 7.4 mg/dL — ABNORMAL LOW (ref 8.9–10.3)
Chloride: 99 mmol/L (ref 98–111)
GFR calc Af Amer: 4 mL/min — ABNORMAL LOW (ref >60)
GFR, EST NON AFRICAN AMERICAN: 3 mL/min — AB (ref >60)
Glucose, Bld: 334 mg/dL — ABNORMAL HIGH (ref 70–99)
POTASSIUM: 3.3 mmol/L — AB (ref 3.5–5.1)
Sodium: 132 mmol/L — ABNORMAL LOW (ref 135–145)

## 2018-07-28 LAB — GLUCOSE, CAPILLARY
GLUCOSE-CAPILLARY: 93 mg/dL (ref 70–99)
Glucose-Capillary: 353 mg/dL — ABNORMAL HIGH (ref 70–99)
Glucose-Capillary: 162 mg/dL — ABNORMAL HIGH (ref 70–99)

## 2018-07-28 LAB — C DIFFICILE QUICK SCREEN W PCR REFLEX
C DIFFICILE (CDIFF) TOXIN: NEGATIVE
C DIFFICLE (CDIFF) ANTIGEN: NEGATIVE
C Diff interpretation: NOT DETECTED

## 2018-07-28 LAB — MRSA PCR SCREENING: MRSA by PCR: NEGATIVE

## 2018-07-28 MED ORDER — CEFAZOLIN SODIUM-DEXTROSE 2-4 GM/100ML-% IV SOLN
2.0000 g | INTRAVENOUS | Status: DC
  Filled 2018-07-28: qty 100

## 2018-07-28 MED ORDER — CEFAZOLIN SODIUM-DEXTROSE 2-4 GM/100ML-% IV SOLN
2.0000 g | Freq: Once | INTRAVENOUS | Status: AC
  Administered 2018-07-29: 2 g via INTRAVENOUS
  Filled 2018-07-28: qty 100

## 2018-07-28 MED ORDER — SODIUM CHLORIDE 0.9 % IV SOLN
INTRAVENOUS | Status: DC
  Administered 2018-07-28 – 2018-07-29 (×2): via INTRAVENOUS

## 2018-07-28 MED ORDER — CEFAZOLIN SODIUM-DEXTROSE 2-4 GM/100ML-% IV SOLN
2.0000 g | INTRAVENOUS | Status: DC
  Administered 2018-07-30 – 2018-08-01 (×2): 2 g via INTRAVENOUS
  Filled 2018-07-28 (×2): qty 100

## 2018-07-28 MED ORDER — CHLORHEXIDINE GLUCONATE CLOTH 2 % EX PADS: 6.0000 | MEDICATED_PAD | Freq: Every day | CUTANEOUS | Status: DC

## 2018-07-28 MED ORDER — DARBEPOETIN ALFA 40 MCG/0.4ML IJ SOSY: 40.0000 ug | PREFILLED_SYRINGE | INTRAMUSCULAR | Status: DC

## 2018-07-28 MED ORDER — INSULIN ASPART 100 UNIT/ML ~~LOC~~ SOLN
0.0000 [IU] | Freq: Three times a day (TID) | SUBCUTANEOUS | Status: DC
  Administered 2018-07-28: 9 [IU] via SUBCUTANEOUS
  Administered 2018-07-29: 7 [IU] via SUBCUTANEOUS
  Administered 2018-07-30: 5 [IU] via SUBCUTANEOUS
  Administered 2018-07-31 – 2018-08-01 (×2): 3 [IU] via SUBCUTANEOUS
  Administered 2018-08-01: 9 [IU] via SUBCUTANEOUS

## 2018-07-28 MED ORDER — CHLORPROMAZINE HCL 25 MG PO TABS
25.0000 mg | ORAL_TABLET | Freq: Three times a day (TID) | ORAL | Status: DC | PRN
  Administered 2018-07-28 – 2018-08-01 (×3): 25 mg via ORAL
  Filled 2018-07-28 (×6): qty 1

## 2018-07-28 MED ORDER — ACETAMINOPHEN 10 MG/ML IV SOLN
1000.0000 mg | Freq: Four times a day (QID) | INTRAVENOUS | Status: AC | PRN
  Administered 2018-07-28: 1000 mg via INTRAVENOUS
  Filled 2018-07-28 (×2): qty 100

## 2018-07-28 MED ORDER — ALBUTEROL SULFATE (2.5 MG/3ML) 0.083% IN NEBU
2.5000 mg | INHALATION_SOLUTION | Freq: Two times a day (BID) | RESPIRATORY_TRACT | Status: DC
  Administered 2018-07-28: 2.5 mg via RESPIRATORY_TRACT
  Filled 2018-07-28 (×2): qty 3

## 2018-07-28 MED ORDER — LOPERAMIDE HCL 2 MG PO CAPS
2.0000 mg | ORAL_CAPSULE | Freq: Four times a day (QID) | ORAL | Status: DC | PRN
  Administered 2018-07-28: 2 mg via ORAL
  Filled 2018-07-28: qty 1

## 2018-07-28 NOTE — Progress Notes (Signed)
Call received from Jeannette Corpus, NP and order for 0.9% NS at 75 ml/hr received.  RN will start IVF as ordered and continue monitoring patient, making provider aware of changes as needed.  P.J. Linus Mako, RN

## 2018-07-28 NOTE — Progress Notes (Signed)
RN paged Jeannette Corpus, NP to inform her patient is unable to take oral or rectal medications at this time due to vomiting and diarrhea.  Order received for IV Tylenol.  RN will continue to monitor and make NP aware of any changes.  P.J. Linus Mako, RN

## 2018-07-28 NOTE — Consult Note (Signed)
Referring Physician: Dr Jonnie Finner  Patient name: Roger Vazquez MRN: [id number] DOB: [birth year] Sex: male  REASON FOR CONSULT: possible infected left arm access  HPI: Roger Vazquez is a 67 y.o. male, very poor historian.  Apparently admitted over the weekend with fevers and staph + blood cultures.  He does not know if he has a graft or a fistula.  He does not know where or when the access was placed.  He denies any drainage.   Dr Jonnie Finner was able to detect some purulent drainage.  He is currently on Ancef.   Past Medical History:  Diagnosis Date  . Diabetes mellitus   . Hypertension   . Renal disorder    No past surgical history on file.  No family history on file.  SOCIAL HISTORY: Social History   Socioeconomic History  . Marital status: Divorced    Spouse name: Not on file  . Number of children: Not on file  . Years of education: Not on file  . Highest education level: Not on file  Occupational History  . Not on file  Social Needs  . Financial resource strain: Not on file  . Food insecurity:    Worry: Not on file    Inability: Not on file  . Transportation needs:    Medical: Not on file    Non-medical: Not on file  Tobacco Use  . Smoking status: Former Smoker    Packs/day: 0.50  Substance and Sexual Activity  . Alcohol use: Yes    Alcohol/week: 9.0 standard drinks    Types: 9 Cans of beer per week  . Drug use: Yes    Types: Marijuana  . Sexual activity: Not on file    Comment: Pt uses vicodin  Lifestyle  . Physical activity:    Days per week: Not on file    Minutes per session: Not on file  . Stress: Not on file  Relationships  . Social connections:    Talks on phone: Not on file    Gets together: Not on file    Attends religious service: Not on file    Active member of club or organization: Not on file    Attends meetings of clubs or organizations: Not on file    Relationship status: Not on file  . Intimate partner violence:    Fear of current  or ex partner: Not on file    Emotionally abused: Not on file    Physically abused: Not on file    Forced sexual activity: Not on file  Other Topics Concern  . Not on file  Social History Narrative  . Not on file    No Known Allergies  Current Facility-Administered Medications  Medication Dose Route Frequency Provider Last Rate Last Dose  . 0.9 %  sodium chloride infusion  250 mL Intravenous PRN Merton Border, MD      . 0.9 %  sodium chloride infusion   Intravenous Continuous Lovey Newcomer T, NP 75 mL/hr at 07/28/18 0434    . acetaminophen (TYLENOL) suppository 650 mg  650 mg Rectal Q4H PRN Merton Border, MD      . albuterol (PROVENTIL) (2.5 MG/3ML) 0.083% nebulizer solution 2.5 mg  2.5 mg Nebulization Q2H PRN Merton Border, MD      . albuterol (PROVENTIL) (2.5 MG/3ML) 0.083% nebulizer solution 2.5 mg  2.5 mg Nebulization BID Thurnell Lose, MD      . aspirin EC tablet 81 mg  81 mg Oral Daily  Merton Border, MD   81 mg at 07/28/18 0949  . ceFAZolin (ANCEF) IVPB 2g/100 mL premix  2 g Intravenous Q M,W,F-HD Susa Raring, [facility]      . heparin injection 5,000 Units  5,000 Units Subcutaneous Q8H Merton Border, MD   5,000 Units at 07/28/18 0746  . insulin aspart (novoLOG) injection 0-9 Units  0-9 Units Subcutaneous TID WC Thurnell Lose, MD      . loperamide (IMODIUM) capsule 2 mg  2 mg Oral Q6H PRN Thurnell Lose, MD      . multivitamin with minerals tablet 1 tablet  1 tablet Oral Daily Merton Border, MD   1 tablet at 07/28/18 0949  . ondansetron (ZOFRAN) tablet 4 mg  4 mg Oral Q6H PRN Merton Border, MD       Or  . ondansetron (ZOFRAN) injection 4 mg  4 mg Intravenous Q6H PRN Merton Border, MD   4 mg at 07/28/18 0020  . pantoprazole (PROTONIX) EC tablet 40 mg  40 mg Oral BID Merton Border, MD   40 mg at 07/28/18 0949  . sodium chloride flush (NS) 0.9 % injection 3 mL  3 mL Intravenous Q12H Merton Border, MD   3 mL at 07/28/18 0950  . sodium chloride flush (NS) 0.9 % injection 3 mL  3 mL Intravenous  PRN Merton Border, MD        ROS:   General:  No weight loss, +Fever, no chills  Cardiac: No recent episodes of chest pain/pressure, no shortness of breath at rest.  No shortness of breath with exertion.  Denies history of atrial fibrillation or irregular heartbeat  Vascular: No history of rest pain in feet.  No history of claudication.  No history of non-healing ulcer, No history of DVT   Urinary: [X]  chronic Kidney disease, [X]  on HD - [ ]  MWF or [ ]  TTHS, [ ]  Burning with urination, [ ]  Frequent urination, [ ]  Difficulty urinating;   Skin: No rashes  Psychological: No history of anxiety,  No history of depression   Physical Examination  Vitals:   07/28/18 0209 07/28/18 0420 07/28/18 0747 07/28/18 0856  BP:  (!) 83/52 122/64   Pulse:      Resp:  16 17   Temp:  98.3 F (36.8 C) 98.6 F (37 C)   TempSrc:  Oral Oral   SpO2: 99%   100%  Weight:        Body mass index is 22.28 kg/m.  General:  Alert and oriented, no acute distress HEENT: Normal Pulmonary: Clear to auscultation bilaterally Cardiac: Regular Rate and Rhythm Skin: No rash no erythema or fluctuance of pain over his upper arm access Extremity Pulses:  Well healed left upper arm scar ? Basilic vein fistula Musculoskeletal: No deformity or edema  Neurologic: Upper and lower extremity motor 5/5 and symmetric  DATA:  CBC    Component Value Date/Time   WBC 7.1 07/28/2018 0450   RBC 2.66 (L) 07/28/2018 0450   HGB 9.0 (L) 07/28/2018 0450   HCT 28.0 (L) 07/28/2018 0450   PLT 88 (L) 07/28/2018 0450   MCV 105.3 (H) 07/28/2018 0450   MCH 33.8 07/28/2018 0450   MCHC 32.1 07/28/2018 0450   RDW 14.8 07/28/2018 0450   LYMPHSABS 0.5 (L) 07/27/2018 1116   MONOABS 0.3 07/27/2018 1116   EOSABS 0.0 07/27/2018 1116   BASOSABS 0.0 07/27/2018 1116   BMET    Component Value Date/Time   NA 132 (L) 07/28/2018 0450  K 3.3 (L) 07/28/2018 0450   CL 99 07/28/2018 0450   CO2 16 (L) 07/28/2018 0450   GLUCOSE 334 (H)  07/28/2018 0450   BUN 107 (H) 07/28/2018 0450   CREATININE 13.66 (H) 07/28/2018 0450   CALCIUM 7.4 (L) 07/28/2018 0450   GFRNONAA 3 (L) 07/28/2018 0450   GFRAA 4 (L) 07/28/2018 0450    ASSESSMENT:  + blood culture.  Access placed elsewhere but has incisions consistant with fistula.  No pointing signs of infection I was unable to express any purulent material   PLAN:  Will follow up on [place] ordered by ID Currently not convincing evidence the access in infected. Will follow Remove sutures that were placed by [facility] several days ago   Ruta Hinds, MD [facility] Office: [phone] Pager: [phone]

## 2018-07-28 NOTE — Progress Notes (Addendum)
Inpatient Diabetes Program Recommendations  AACE/ADA: New Consensus Statement on Inpatient Glycemic Control (2015)  Target Ranges:  Prepandial:   less than 140 mg/dL      Peak postprandial:   less than 180 mg/dL (1-2 hours)      Critically ill patients:  140 - 180 mg/dL   Lab Results  Component Value Date   GLUCAP 203 (H) 10/12/2011   HGBA1C 7.3 (H) 10/07/2011    Review of Glycemic Control Results for Roger Vazquez, Roger Vazquez (MRN [id number]) as of 07/28/2018 11:30  Ref. Range 07/27/2018 14:49 07/27/2018 15:29 07/28/2018 04:50  Glucose Latest Ref Range: 70 - 99 mg/dL 331 (H) 327 (H) 334 (H)   Diabetes history: Type 2 DM Outpatient Diabetes medications: none noted Current orders for Inpatient glycemic control: none  Inpatient Diabetes Program Recommendations:     Last A1C from 2012, consider repeating A1C?  Inpatient blood sugars per serum have all exceeded 300's mg/dL. Consider adding Novolog 0-9 units TID, and Novolog 0-5 units QHS. And obtaining CBGs TID + HS.   In the setting of infection, consider adding Lantus 8 units QHS if FSBS continue to remain >180 mg/dL.   Thanks, Bronson Curb, MSN, RNC-OB Diabetes Coordinator [phone] (8a-5p)

## 2018-07-28 NOTE — Progress Notes (Addendum)
PROGRESS NOTE                                                                                                                                                                                                             Patient Demographics:    Roger Vazquez, is a 67 y.o. male, DOB - August 09, 1951, RCV:[id number]  Admit date - 07/27/2018   Admitting Physician Merton Border, MD  Outpatient Primary MD for the patient is [facility]  LOS - 1  Chief Complaint  Patient presents with  . Altered/Sepsis       Brief Narrative  Roger Vazquez  is a 67 y.o. male, with past medical history significant for end-stage renal disease on hemodialysis who was seen good last on Friday, sent to our emergency room for evaluation due to altered mental status and fever.  Patient was found covered with stool and had a high fever upon admission, with no obvious source, further work-up showed that he had MSSA bacteremia.   Subjective:    Roger Vazquez today has, No headache, No cand had a high fever,hest pain, No abdominal pain - No Nausea, No new weakness tingling or numbness, No Cough - SOB.     Assessment  & Plan :     1.  Sepsis with MSSA bacteremia.  Source unclear.  Does have a mild cough and C. difficile negative diarrhea, left arm dialysis graft site stable on exam.  Currently on Ancef, ID on board.  TTE pending.  Clinically sepsis physiology seems to have resolved will monitor.  2.  ESRD.  Dialysis schedule Monday, Wednesday and Friday.  Renal informed.  3.  History of cocaine and alcohol abuse.  Says he has quit both for several years.  Will monitor.  4.  History of hepatitis C.  Outpatient GI/ID follow-up as needed been no acute issues.  5.  Diarrhea.  C. difficile negative.  Supportive care.  6.  Essential hypertension.  Pressure currently soft, PRN hydralazine only and monitor.  7.  GERD.  On PPI.  8. Hyperglycemia - ? Due to sepsis, check A1c, ISS.  Lab Results    Component Value Date   HGBA1C 7.3 (H) 10/07/2011   CBG (last 3)  No results for input(s): GLUCAP in the last 72 hours.    Family Communication  :  neighbor   Code Status :  Full  Disposition Plan  :  TBD  Consults  :  ID, Renal  Procedures  :  TTE -   DVT Prophylaxis  :   Heparin    Lab Results  Component Value Date   PLT 88 (L) 07/28/2018    Diet :  Diet Order            Diet clear liquid Room service appropriate? Yes; Fluid consistency: Thin  Diet effective now               Inpatient Medications Scheduled Meds: . albuterol  2.5 mg Nebulization BID  . aspirin EC  81 mg Oral Daily  . heparin  5,000 Units Subcutaneous Q8H  . multivitamin with minerals  1 tablet Oral Daily  . pantoprazole  40 mg Oral BID  . sodium chloride flush  3 mL Intravenous Q12H   Continuous Infusions: . sodium chloride    . sodium chloride 75 mL/hr at 07/28/18 0434  .  ceFAZolin (ANCEF) IV     PRN Meds:.sodium chloride, acetaminophen, albuterol, ondansetron **OR** ondansetron (ZOFRAN) IV, sodium chloride flush  Antibiotics  :   Anti-infectives (From admission, onward)   Start     Dose/Rate Route Frequency Ordered Stop   07/28/18 1800  ceFEPIme (MAXIPIME) 2 g in sodium chloride 0.9 % 100 mL IVPB  Status:  Discontinued     2 g 200 mL/hr over 30 Minutes Intravenous Every M-W-F (Hemodialysis) 07/27/18 1239 07/28/18 0913   07/28/18 1200  vancomycin (VANCOCIN) IVPB 750 mg/150 ml premix  Status:  Discontinued     750 mg 150 mL/hr over 60 Minutes Intravenous Every M-W-F (Hemodialysis) 07/27/18 1239 07/28/18 0913   07/28/18 1200  ceFAZolin (ANCEF) IVPB 2g/100 mL premix     2 g 200 mL/hr over 30 Minutes Intravenous Every M-W-F (Hemodialysis) 07/28/18 0913     07/27/18 1115  ceFEPIme (MAXIPIME) 2 g in sodium chloride 0.9 % 100 mL IVPB     2 g 200 mL/hr over 30 Minutes Intravenous  Once 07/27/18 1114 07/27/18 1257   07/27/18 1115  metroNIDAZOLE (FLAGYL) IVPB 500 mg  Status:   Discontinued     500 mg 100 mL/hr over 60 Minutes Intravenous Every 8 hours 07/27/18 1114 07/28/18 0913   07/27/18 1115  vancomycin (VANCOCIN) IVPB 1000 mg/200 mL premix     1,000 mg 200 mL/hr over 60 Minutes Intravenous  Once 07/27/18 1114 07/27/18 1427          Objective:   Vitals:   07/28/18 0209 07/28/18 0420 07/28/18 0747 07/28/18 0856  BP:  (!) 83/52 122/64   Pulse:      Resp:  16 17   Temp:  98.3 F (36.8 C) 98.6 F (37 C)   TempSrc:  Oral Oral   SpO2: 99%   100%  Weight:        Wt Readings from Last 3 Encounters:  07/27/18 62.6 kg  09/13/14 62.6 kg  10/12/11 69.3 kg     Intake/Output Summary (Last 24 hours) at 07/28/2018 1057 Last data filed at 07/28/2018 0949 Gross per 24 hour  Intake 888.31 ml  Output -  Net 888.31 ml     Physical Exam  Awake Alert, Oriented X 3, No new F.N deficits, Normal affect [place].AT,PERRAL Supple Neck,No JVD, No cervical lymphadenopathy appriciated.  Symmetrical Chest wall movement, Good air movement bilaterally, CTAB RRR,No Gallops,Rubs or new Murmurs, No Parasternal Heave +ve B.Sounds, Abd Soft, No tenderness, No organomegaly appriciated, No rebound - guarding or rigidity. No Cyanosis, Clubbing or edema, L arm Graft under bandage - not warm, + ve bruit  Data Review:    CBC Recent Labs  Lab 07/27/18 1116 07/27/18 1449 07/28/18 0450  WBC 8.7  --  7.1  HGB 10.7* 11.9* 9.0*  HCT 35.2* 35.0* 28.0*  PLT 106*  --  88*  MCV 111.7*  --  105.3*  MCH 34.0  --  33.8  MCHC 30.4  --  32.1  RDW 15.2  --  14.8  LYMPHSABS 0.5*  --   --   MONOABS 0.3  --   --   EOSABS 0.0  --   --   BASOSABS 0.0  --   --     Chemistries  Recent Labs  Lab 07/27/18 1357 07/27/18 1449 07/27/18 1529 07/28/18 0450  NA 141 132* 131* 132*  K UNABLE TO OBTAIN RESULT APPEARS EDTA CONTAMINANT 3.9 3.8 3.3*  CL 125* 96* 96* 99  CO2 9*  --  18* 16*  GLUCOSE 202* 331* 327* 334*  BUN 52* 105* 101* 107*  CREATININE 6.09* 14.00* 12.78* 13.66*    CALCIUM  UNABLE TO OBTAIN RESULTS APPEARS EDTA CONTAMINANT  --  8.0* 7.4*  AST 20  --  42*  --   ALT 11  --  22  --   ALKPHOS 30*  --  66  --   BILITOT 0.5  --  0.8  --    ------------------------------------------------------------------------------------------------------------------ No results for input(s): CHOL, HDL, LDLCALC, TRIG, CHOLHDL, LDLDIRECT in the last 72 hours.  Lab Results  Component Value Date   HGBA1C 7.3 (H) 10/07/2011   ------------------------------------------------------------------------------------------------------------------ No results for input(s): TSH, T4TOTAL, T3FREE, THYROIDAB in the last 72 hours.  Invalid input(s): FREET3 ------------------------------------------------------------------------------------------------------------------ No results for input(s): VITAMINB12, FOLATE, FERRITIN, TIBC, IRON, RETICCTPCT in the last 72 hours.  Coagulation profile Recent Labs  Lab 07/27/18 1116  INR 0.99    No results for input(s): DDIMER in the last 72 hours.  Cardiac Enzymes No results for input(s): CKMB, TROPONINI, MYOGLOBIN in the last 168 hours.  Invalid input(s): CK ------------------------------------------------------------------------------------------------------------------ No results found for: BNP  Micro Results Recent Results (from the past 240 hour(s))  Culture, blood (Routine x 2)     Status: None (Preliminary result)   Collection Time: 07/27/18 11:04 AM  Result Value Ref Range Status   Specimen Description BLOOD BLOOD RIGHT FOREARM  Final   Special Requests   Final    BOTTLES DRAWN AEROBIC ONLY Blood Culture adequate volume   Culture  Setup Time   Final    AEROBIC BOTTLE ONLY GRAM POSITIVE COCCI Organism ID to follow CRITICAL RESULT CALLED TO, READ BACK BY AND VERIFIED WITH: J.LEDFORD,PHARMD AT 0500 ON 07/28/18 BY [email or web address] Performed at [facility], [place] [address] [place], [place] [address]    Culture PENDING   Incomplete   Report Status PENDING  Incomplete  Blood Culture ID Panel (Reflexed)     Status: Abnormal   Collection Time: 07/27/18 11:04 AM  Result Value Ref Range Status   Enterococcus species NOT DETECTED NOT DETECTED Final   Listeria monocytogenes NOT DETECTED NOT DETECTED Final   Staphylococcus species DETECTED (A) NOT DETECTED Final    Comment: CRITICAL RESULT CALLED TO, READ BACK BY AND VERIFIED WITH: J.LEDFORD,PHARMD AT 0500 ON 07/28/18 BY G.[email or web address]    Staphylococcus aureus (BCID) DETECTED (A) NOT DETECTED Final    Comment: CRITICAL RESULT CALLED TO, READ BACK BY AND VERIFIED WITH: J.LEDFORD,PHARMD AT 0500 ON 07/28/18 BY G.[email or web address] Methicillin (oxacillin) susceptible Staphylococcus aureus (MSSA). Preferred therapy is anti staphylococcal beta lactam antibiotic (Cefazolin or Nafcillin), unless  clinically contraindicated.    Methicillin resistance NOT DETECTED NOT DETECTED Final   Streptococcus species NOT DETECTED NOT DETECTED Final   Streptococcus agalactiae NOT DETECTED NOT DETECTED Final   Streptococcus pneumoniae NOT DETECTED NOT DETECTED Final   Streptococcus pyogenes NOT DETECTED NOT DETECTED Final   Acinetobacter baumannii NOT DETECTED NOT DETECTED Final   Enterobacteriaceae species NOT DETECTED NOT DETECTED Final   Enterobacter cloacae complex NOT DETECTED NOT DETECTED Final   Escherichia coli NOT DETECTED NOT DETECTED Final   Klebsiella oxytoca NOT DETECTED NOT DETECTED Final   Klebsiella pneumoniae NOT DETECTED NOT DETECTED Final   Proteus species NOT DETECTED NOT DETECTED Final   Serratia marcescens NOT DETECTED NOT DETECTED Final   Haemophilus influenzae NOT DETECTED NOT DETECTED Final   Neisseria meningitidis NOT DETECTED NOT DETECTED Final   Pseudomonas aeruginosa NOT DETECTED NOT DETECTED Final   Candida albicans NOT DETECTED NOT DETECTED Final   Candida glabrata NOT DETECTED NOT DETECTED Final   Candida krusei NOT DETECTED NOT DETECTED Final   Candida parapsilosis  NOT DETECTED NOT DETECTED Final   Candida tropicalis NOT DETECTED NOT DETECTED Final    Comment: Performed at [facility], [place] [address] [place], [place] [address]  Culture, blood (Routine x 2)     Status: None (Preliminary result)   Collection Time: 07/27/18 11:11 AM  Result Value Ref Range Status   Specimen Description BLOOD BLOOD RIGHT ARM  Final   Special Requests   Final    BOTTLES DRAWN AEROBIC ONLY Blood Culture results may not be optimal due to an inadequate volume of blood received in culture bottles   Culture  Setup Time   Final    GRAM POSITIVE COCCI IN CLUSTERS AEROBIC BOTTLE ONLY CRITICAL VALUE NOTED.  VALUE IS CONSISTENT WITH PREVIOUSLY REPORTED AND CALLED VALUE. Performed at [facility], [place] [address] [place], [place] [address]    Culture PENDING  Incomplete   Report Status PENDING  Incomplete  C difficile quick scan w PCR reflex     Status: None   Collection Time: 07/27/18  9:28 PM  Result Value Ref Range Status   C Diff antigen NEGATIVE NEGATIVE Final   C Diff toxin NEGATIVE NEGATIVE Final   C Diff interpretation No C. difficile detected.  Final    Comment: Performed at [facility], [place] [address], [place], [place] [address]  MRSA PCR Screening     Status: None   Collection Time: 07/28/18  1:30 AM  Result Value Ref Range Status   MRSA by PCR NEGATIVE NEGATIVE Final    Comment:        The GeneXpert MRSA Assay (FDA approved for NASAL specimens only), is one component of a comprehensive MRSA colonization surveillance program. It is not intended to diagnose MRSA infection nor to guide or monitor treatment for MRSA infections. Performed at [facility], [place] [address], [place], [place] [address]     Radiology Reports Dg Chest Portable 1 View  Result Date: 07/27/2018 CLINICAL DATA:  Sepsis. Fever and altered mental status. Hypertension and diabetes. Ex-smoker. EXAM: PORTABLE CHEST 1 VIEW COMPARISON:  02/14/2011 FINDINGS: Left  axillary vascular stent. Numerous leads and wires project over the chest. Midline trachea. Normal heart size. Atherosclerosis in the transverse aorta. No pleural effusion or pneumothorax. Mild left hemidiaphragm elevation. Clear lungs. Interval but nonacute left rib deformities could be posttraumatic or postsurgical. IMPRESSION: No acute findings. Aortic Atherosclerosis (ICD10-I70.0). Electronically Signed   By: Marylyn Ishihara  Jobe Igo M.D.   On: 07/27/2018 12:10    Time Spent in minutes  30   Lala Lund M.D on 07/28/2018 at 10:57 AM  To page go to [email or web address] - password [facility]

## 2018-07-28 NOTE — Progress Notes (Signed)
RN paged Jeannette Corpus, NP to report BP of 83/52, awaiting response. P.J. Linus Mako, RN

## 2018-07-28 NOTE — Progress Notes (Signed)
Pharmacy Antibiotic Note  Roger Vazquez is a 67 y.o. male admitted on 07/27/2018 with MSSA  bacteremia.  Pharmacy has been consulted for cefazolin dosing. Patient has ESRD and receives HD on MWF. He also has a history of a new fistula. Patient had initial doses of cefepime and vancomycin yesterday and has had no dialysis since that time so will start cefazolin dosing after next dialysis session.   Plan: Start cefazolin 2 gm every MWF with HD Monitor blood cultures, ECHO and dialysis schedule    Weight: 138 lb 0.1 oz (62.6 kg)  Temp (24hrs), Avg:101 F (38.3 C), Min:98.3 F (36.8 C), Max:104.5 F (40.3 C)  Recent Labs  Lab 07/27/18 1116 07/27/18 1124 07/27/18 1357 07/27/18 1405 07/27/18 1449 07/27/18 1529 07/28/18 0450  WBC 8.7  --   --   --   --   --  7.1  CREATININE  --   --  6.09*  --  14.00* 12.78* 13.66*  LATICACIDVEN  --  2.66*  --  0.94  --   --   --     CrCl cannot be calculated (Unknown ideal weight.).    No Known Allergies  Antimicrobials this admission: 10/13 Vanc>>10/14 10/13 cefepime >>10/14 10/13 flagyl>>10/14 10/14 cefazolin>     Thank you for allowing pharmacy to be a part of this patient's care.  Susa Raring, PharmD, BCPS Infectious Diseases Clinical Pharmacist TGPQD:[phone] 07/28/2018 9:26 AM

## 2018-07-28 NOTE — Progress Notes (Signed)
2D Echocardiogram has been performed.  Roger Vazquez 07/28/2018, 11:43 AM

## 2018-07-28 NOTE — Progress Notes (Signed)
Gerre Pebbles, RN called and made aware that patient's HD tx has been moved to 07/29/18.

## 2018-07-28 NOTE — Consult Note (Addendum)
Renal Service Consult Note [facility]  MARY HOCKEY 07/28/2018 Sol Blazing Requesting Physician:  DR Ronnie Derby  Reason for Consult:  ESRD pt w/ staph bacteremia HPI: The patient is a 67 y.o. year-old presented to ED from home w/ fevers and AMS.  Has been sick since his last HD last Friday, 3 days ago.  Pt was admitted w/ diagnosis sepsis and started on broad - spec IV abx.  Having diarrhea as well. Asked to see for ESRD.   Seen and examined at bedside with 2 friends present.  Reports diarrhea, fever, chills, confusion and imbalance at home that started the end of last week.  States he is feeling a little better today.  Denies pain, chest pain, SOB, nausea and vomiting.  Reports he had a declot of his LU AVG at [facility] last week.    Review of outpatient records shows a successful PTA of 60% axillary vein and 60% outflow edge stenosis by [facility] on 07/24/18.  With notes to remove stitches at next HD and follow up in 1 week to determine if axillary vein stenting needed.  Patient is unsure who originally placed his graft, but it appears to have been placed prior to transferring to [place] from [place].   Pertinent findings include BC+ staph aureus, in ED yesterday tmax was 104.5, Cr 13.66, BUN 107 and purulent drainage from AVG.  Patient has been admitted for further evaluation and management.  ROS  denies CP  no joint pain   no HA  no blurry vision  no rash  no diarrhea  no nausea/ vomiting  no dysuria  no difficulty voiding  no change in urine color    Past Medical History  Past Medical History:  Diagnosis Date  . Diabetes mellitus   . Hypertension   . Renal disorder    Past Surgical History No past surgical history on file. Family History No family history on file. Social History  reports that he has quit smoking. He smoked 0.50 packs per day. He does not have any smokeless tobacco history on file. He reports that he drinks about 9.0 standard drinks of alcohol per  week. He reports that he has current or past drug history. Drug: Marijuana. Allergies No Known Allergies Home medications Prior to Admission medications   Medication Sig Start Date End Date Taking? Authorizing Provider  amLODipine (NORVASC) 10 MG tablet Take 5 mg by mouth daily.      [provider]  aspirin 81 MG tablet Take 81 mg by mouth daily.      [provider]  hydrALAZINE (APRESOLINE) 25 MG tablet Take 2 tablets (50 mg total) by mouth every 8 (eight) hours. 10/12/11 10/11/12  Robbie Lis, MD  HYDROcodone-acetaminophen (NORCO/VICODIN) 5-325 MG per tablet Take 1 tablet by mouth every 6 (six) hours as needed for severe pain. Patient not taking: Reported on 07/27/2018 09/13/14   Domenic Moras, PA-C  methocarbamol (ROBAXIN) 500 MG tablet Take 1 tablet (500 mg total) by mouth 2 (two) times daily. Patient not taking: Reported on 07/27/2018 09/13/14   Domenic Moras, PA-C  Multiple Vitamin (MULITIVITAMIN WITH MINERALS) TABS Take 1 tablet by mouth daily. 10/12/11   Robbie Lis, MD  pantoprazole (PROTONIX) 40 MG tablet Take 1 tablet (40 mg total) by mouth 2 (two) times daily. 10/12/11 10/11/12  Robbie Lis, MD   Liver Function Tests Recent Labs  Lab 07/27/18 1357 07/27/18 1529  AST 20 42*  ALT 11 22  ALKPHOS  30* 66  BILITOT 0.5 0.8  PROT 3.3* 7.2  ALBUMIN 1.5* 3.4*   CBC Recent Labs  Lab 07/27/18 1116 07/27/18 1449 07/28/18 0450  WBC 8.7  --  7.1  NEUTROABS 7.8*  --   --   HGB 10.7* 11.9* 9.0*  HCT 35.2* 35.0* 28.0*  MCV 111.7*  --  105.3*  PLT 106*  --  88*   Basic Metabolic Panel Recent Labs  Lab 07/27/18 1357 07/27/18 1449 07/27/18 1529 07/28/18 0450  NA 141 132* 131* 132*  K UNABLE TO OBTAIN RESULT APPEARS EDTA CONTAMINANT 3.9 3.8 3.3*  CL 125* 96* 96* 99  CO2 9*  --  18* 16*  GLUCOSE 202* 331* 327* 334*  BUN 52* 105* 101* 107*  CREATININE 6.09* 14.00* 12.78* 13.66*  CALCIUM  UNABLE TO OBTAIN RESULTS APPEARS EDTA CONTAMINANT  --  8.0*  7.4*   Iron/TIBC/Ferritin/ %Sat No results found for: IRON, TIBC, FERRITIN, IRONPCTSAT  Vitals:   07/28/18 0209 07/28/18 0420 07/28/18 0747 07/28/18 0856  BP:  (!) 83/52 122/64   Pulse:      Resp:  16 17   Temp:  98.3 F (36.8 C) 98.6 F (37 C)   TempSrc:  Oral Oral   SpO2: 99%   100%  Weight:       Exam Gen NAD, chronically ill appearing male, resting in bed No rash, cyanosis or gangrene Sclera anicteric, throat clear   No jvd or bruits  Chest +crackles in LLL RRR no MRG  Abd soft ntnd no mass or ascites +bs  MS no joint effusions or deformity  Ext no edema, no wounds or ulcers. LUE: +stitches on bottom loop of AVG with surrounding fluctuance and purulent drainage, purulent drainage also from area on upper proximal portion of AVG Neuro is alert, Ox 3 , nf  LU AVG +b/t   Home meds:  - amlodipine 5/ hydralazine 50 tid  - pantoprazole 40 bid/ MVI/ aspirin 81 mg  - hydrocodone- aceta prn/ methocarbamol 500 bid prn  Dialysis: MWF  3h 61min  400/AF1.5   59kg  2K/3.5Ca  P4  Hep 4000    - venofer 50/wk  - mircera 30 ug last 10/2   Impression/ Plan: 1. Bacteremia : tmax 104.79F, BC +MSSA. On Ancef. ID evaluating 2. ?AVG infection - purulent drainage near recent stitches.  VVS consulted.  They said OK to use AVF.   3. ESRD - on HD MWF.  Orders written for today after VVS recommendations.  SCr/BUN elevated, K 3.3.  Mild uremic symptoms.  4. Anemia of CKD - Hgb 9.0.  ESA due Wed, orders written for aranesp 84mcg qWed.  5. Metabolic bone disease - Ca in goal. no phos. Not on VDRA, binders.  Check labs pre HD. 6. Nutrition - Renal diet w/fluid restrictions once advanced. 7. HTN/Volume - BP soft, on prn hydralazine. Does not appear volume overloaded on exam.  Per bed weights, 3.6L over EDW.  Will get standing weights pre HD to better assess.  8. GERD 9. Hx cocaine/ETOH abuse 10. Hx Hep C   Kelly Splinter MD [facility] pager [phone]   07/28/2018, 10:11  AM

## 2018-07-28 NOTE — Consult Note (Signed)
Roger Vazquez for Infectious Disease    Date of Admission:  07/27/2018     Total days of antibiotics                Reason for Consult: MSSA Bacteremia  Referring Provider: CHAMP / [employer] Primary Care Provider: Clinic, Roger Vazquez   Assessment/Plan:  Roger Vazquez is a 67 year old male with MSSA bacteremia of uncertain origin with concern for endocarditis or fistula infection. There does not appear to be any obvious signs of infection of the fistula at present. Currently receiving vancomycin, cefepime and metronidazole. C. Diff testing was negative. Will require prolonged course of antibiotics with dialysis. Previously positive for Hepatitis C with no evidence of treatment.  1. Discontinue vancomycin and cefepime and change to cefazolin. 2. Repeat blood cultures in the morning.  3. Discontinue enteric precautions and metronidazole as C. Diff was negative.  4. Check HIV status and Hepatitis C viral load. If positive can consider outpatient treatment.        [facility] Staphylococcus aureus bacteremia   Staphylococcus aureus bacteremia (SAB) is associated with a high rate of complications and mortality.  Specific aspects of clinical management are critical to optimizing the outcome of patients with SAB.  Therefore, the [facility] Antimicrobial Management Team [facility]) has initiated an intervention aimed at improving the management of SAB at [facility].  To do so, Infectious Diseases physicians are providing an evidence-based consult for the management of all patients with SAB.     Yes No Comments  Perform follow-up blood cultures (even if the patient is afebrile) to ensure clearance of bacteremia [x]  []  Scheduled   Remove vascular catheter and obtain follow-up blood cultures after the removal of the catheter []  [x]  None present   Perform echocardiography to evaluate for endocarditis (transthoracic ECHO is 40-50% sensitive, TEE is > 90%  sensitive) [x]  []  TTE ordered  Consult electrophysiologist to evaluate implanted cardiac device (pacemaker, ICD) []  [x]    Ensure source control [x]  []  Pending - question fistula versus endocarditis  Investigate for "metastatic" sites of infection [x]  []  Does the patient have ANY symptom or physical exam finding that would suggest a deeper infection (back or neck pain that may be suggestive of vertebral osteomyelitis or epidural abscess, muscle pain that could be a symptom of pyomyositis)?  Keep in mind that for deep seeded infections MRI imaging with contrast is preferred rather than other often insensitive tests such as plain x-rays, especially early in a patient's presentation.  Change antibiotic therapy to __________________ []  []  Vancomycin/cefepime changed to cefazolin  Estimated duration of IV antibiotic therapy:   []  []  Likely 4-6 weeks      Principal Problem:   Bacteremia due to methicillin susceptible Staphylococcus aureus (MSSA) Active Problems:   Sepsis ([place])   . albuterol  2.5 mg Nebulization BID  . aspirin EC  81 mg Oral Daily  . heparin  5,000 Units Subcutaneous Q8H  . multivitamin with minerals  1 tablet Oral Daily  . pantoprazole  40 mg Oral BID  . sodium chloride flush  3 mL Intravenous Q12H     HPI: Roger Vazquez is a 67 y.o. male with previous medical history relevant for diabetes and ESRD on dialysis who was admitted with the chief complain of altered mental status and fever. In the ED he was noted to have dried stool on his feet. Family believed he was keeping up with his dialysis.   Vitals in  the ED with tachycardia of 109 and febrile at 102.8. Lactic acid of 2.66.  Remaining lab work consistent with ESRD. Chest x-ray with no acute findings and lungs being clear. Initially started on vancomycin and cefepime with concern for sepsis. Blood cultures positive for MSSA. C. Diff screen negative. MRSA PCR negative.   Febrile upon admission now with normalizing  temperature. Roger Vazquez lives with a roommate and has not been feeling well for 1 week describing having shaking chills and fevers. Has been attending dialysis on Monday, Wednesday and Friday.   Has had new fistula placed in the left upper arm recently, but denies pain, redness or swelling. No abdominal pain, nausea or vomiting.   Review of Systems: Review of Systems  Constitutional: Positive for chills and fever. Negative for diaphoresis and weight loss.  Respiratory: Negative for cough, shortness of breath and wheezing.   Cardiovascular: Negative for chest pain and leg swelling.  Gastrointestinal: Positive for diarrhea. Negative for abdominal pain, heartburn, nausea and vomiting.  Skin: Negative for rash.     Past Medical History:  Diagnosis Date  . Diabetes mellitus   . Hypertension   . Renal disorder     Social History   Tobacco Use  . Smoking status: Former Smoker    Packs/day: 0.50  Substance Use Topics  . Alcohol use: Yes    Alcohol/week: 9.0 standard drinks    Types: 9 Cans of beer per week  . Drug use: Yes    Types: Marijuana    No family history on file.  No Known Allergies  OBJECTIVE: Blood pressure 122/64, pulse (!) 109, temperature 98.6 F (37 C), temperature source Oral, resp. rate 17, weight 62.6 kg, SpO2 100 %.  Physical Exam  Constitutional: He appears well-developed and well-nourished. No distress.  Lying in the bed, pleasant.   Cardiovascular: Regular rhythm and intact distal pulses. Tachycardia present. Exam reveals no gallop and no friction rub.  Murmur heard. Pulmonary/Chest: Effort normal and breath sounds normal. No respiratory distress. He has no wheezes. He has no rales. He exhibits no tenderness.  Abdominal: Soft. Bowel sounds are normal. He exhibits no distension. There is no tenderness. There is no rebound and no guarding.  Neurological: He is alert.  Skin: Skin is warm and dry.  Possible Janeway lesions on the foot.  Psychiatric: He has  a normal mood and affect.    Lab Results Lab Results  Component Value Date   WBC 7.1 07/28/2018   HGB 9.0 (L) 07/28/2018   HCT 28.0 (L) 07/28/2018   MCV 105.3 (H) 07/28/2018   PLT 88 (L) 07/28/2018    Lab Results  Component Value Date   CREATININE 13.66 (H) 07/28/2018   BUN 107 (H) 07/28/2018   NA 132 (L) 07/28/2018   K 3.3 (L) 07/28/2018   CL 99 07/28/2018   CO2 16 (L) 07/28/2018    Lab Results  Component Value Date   ALT 22 07/27/2018   AST 42 (H) 07/27/2018   ALKPHOS 66 07/27/2018   BILITOT 0.8 07/27/2018     Microbiology: Recent Results (from the past 240 hour(s))  Culture, blood (Routine x 2)     Status: None (Preliminary result)   Collection Time: 07/27/18 11:04 AM  Result Value Ref Range Status   Specimen Description BLOOD BLOOD RIGHT FOREARM  Final   Special Requests   Final    BOTTLES DRAWN AEROBIC ONLY Blood Culture adequate volume   Culture  Setup Time   Final  AEROBIC BOTTLE ONLY GRAM POSITIVE COCCI Organism ID to follow CRITICAL RESULT CALLED TO, READ BACK BY AND VERIFIED WITH: J.LEDFORD,PHARMD AT 0500 ON 07/28/18 BY [email or web address] Performed at [facility], [place] [address], [place], [place] [address]    Culture PENDING  Incomplete   Report Status PENDING  Incomplete  Blood Culture ID Panel (Reflexed)     Status: Abnormal   Collection Time: 07/27/18 11:04 AM  Result Value Ref Range Status   Enterococcus species NOT DETECTED NOT DETECTED Final   Listeria monocytogenes NOT DETECTED NOT DETECTED Final   Staphylococcus species DETECTED (A) NOT DETECTED Final    Comment: CRITICAL RESULT CALLED TO, READ BACK BY AND VERIFIED WITH: J.LEDFORD,PHARMD AT 0500 ON 07/28/18 BY G.[email or web address]    Staphylococcus aureus (BCID) DETECTED (A) NOT DETECTED Final    Comment: CRITICAL RESULT CALLED TO, READ BACK BY AND VERIFIED WITH: J.LEDFORD,PHARMD AT 0500 ON 07/28/18 BY G.[email or web address] Methicillin (oxacillin) susceptible Staphylococcus aureus (MSSA). Preferred therapy is anti  staphylococcal beta lactam antibiotic (Cefazolin or Nafcillin), unless clinically contraindicated.    Methicillin resistance NOT DETECTED NOT DETECTED Final   Streptococcus species NOT DETECTED NOT DETECTED Final   Streptococcus agalactiae NOT DETECTED NOT DETECTED Final   Streptococcus pneumoniae NOT DETECTED NOT DETECTED Final   Streptococcus pyogenes NOT DETECTED NOT DETECTED Final   Acinetobacter baumannii NOT DETECTED NOT DETECTED Final   Enterobacteriaceae species NOT DETECTED NOT DETECTED Final   Enterobacter cloacae complex NOT DETECTED NOT DETECTED Final   Escherichia coli NOT DETECTED NOT DETECTED Final   Klebsiella oxytoca NOT DETECTED NOT DETECTED Final   Klebsiella pneumoniae NOT DETECTED NOT DETECTED Final   Proteus species NOT DETECTED NOT DETECTED Final   Serratia marcescens NOT DETECTED NOT DETECTED Final   Haemophilus influenzae NOT DETECTED NOT DETECTED Final   Neisseria meningitidis NOT DETECTED NOT DETECTED Final   Pseudomonas aeruginosa NOT DETECTED NOT DETECTED Final   Candida albicans NOT DETECTED NOT DETECTED Final   Candida glabrata NOT DETECTED NOT DETECTED Final   Candida krusei NOT DETECTED NOT DETECTED Final   Candida parapsilosis NOT DETECTED NOT DETECTED Final   Candida tropicalis NOT DETECTED NOT DETECTED Final    Comment: Performed at [facility], [place] [address], [place], [place] [address]  Culture, blood (Routine x 2)     Status: None (Preliminary result)   Collection Time: 07/27/18 11:11 AM  Result Value Ref Range Status   Specimen Description BLOOD BLOOD RIGHT ARM  Final   Special Requests   Final    BOTTLES DRAWN AEROBIC ONLY Blood Culture results may not be optimal due to an inadequate volume of blood received in culture bottles   Culture  Setup Time   Final    GRAM POSITIVE COCCI IN CLUSTERS AEROBIC BOTTLE ONLY CRITICAL VALUE NOTED.  VALUE IS CONSISTENT WITH PREVIOUSLY REPORTED AND CALLED VALUE. Performed at [facility],  [place] [address], [place], [place] [address]    Culture PENDING  Incomplete   Report Status PENDING  Incomplete  C difficile quick scan w PCR reflex     Status: None   Collection Time: 07/27/18  9:28 PM  Result Value Ref Range Status   C Diff antigen NEGATIVE NEGATIVE Final   C Diff toxin NEGATIVE NEGATIVE Final   C Diff interpretation No C. difficile detected.  Final    Comment: Performed at [facility], [place] [address] [place], [place] [address]  MRSA PCR Screening     Status: None  Collection Time: 07/28/18  1:30 AM  Result Value Ref Range Status   MRSA by PCR NEGATIVE NEGATIVE Final    Comment:        The GeneXpert MRSA Assay (FDA approved for NASAL specimens only), is one component of a comprehensive MRSA colonization surveillance program. It is not intended to diagnose MRSA infection nor to guide or monitor treatment for MRSA infections. Performed at [facility], [place] [address] [place], [place] [address]      Terri Piedra, [place] for [facility] [phone] Pager  07/28/2018  9:37 AM

## 2018-07-28 NOTE — Progress Notes (Signed)
RN paged Jeannette Corpus, NP to clarify if patient is to have continuous IVF since he is HD patient, but also septic.  RN awaiting response, will continue to monitor patient and make provider aware of any changes.  P.J. Linus Mako, RN

## 2018-07-29 DIAGNOSIS — N186 End stage renal disease: Secondary | ICD-10-CM

## 2018-07-29 DIAGNOSIS — Z992 Dependence on renal dialysis: Secondary | ICD-10-CM

## 2018-07-29 LAB — CBC
HCT: 28 % — ABNORMAL LOW (ref 39.0–52.0)
HEMATOCRIT: 24.2 % — AB (ref 39.0–52.0)
HEMOGLOBIN: 7.9 g/dL — AB (ref 13.0–17.0)
Hemoglobin: 9 g/dL — ABNORMAL LOW (ref 13.0–17.0)
MCH: 33.8 pg (ref 26.0–34.0)
MCH: 33.8 pg (ref 26.0–34.0)
MCHC: 32.1 g/dL (ref 30.0–36.0)
MCHC: 32.6 g/dL (ref 30.0–36.0)
MCV: 105.3 fL — AB (ref 80.0–100.0)
MCV: 103.4 fL — ABNORMAL HIGH (ref 80.0–100.0)
NRBC: 0 % (ref 0.0–0.2)
PLATELETS: 88 10*3/uL — AB (ref 150–400)
Platelets: 87 10*3/uL — ABNORMAL LOW (ref 150–400)
RBC: 2.66 MIL/uL — ABNORMAL LOW (ref 4.22–5.81)
RBC: 2.34 MIL/uL — AB (ref 4.22–5.81)
RDW: 14.8 % (ref 11.5–15.5)
RDW: 14.9 % (ref 11.5–15.5)
WBC: 7.1 10*3/uL (ref 4.0–10.5)
WBC: 6.2 10*3/uL (ref 4.0–10.5)
nRBC: 0 % (ref 0.0–0.2)

## 2018-07-29 LAB — RENAL FUNCTION PANEL
ALBUMIN: 2.7 g/dL — AB (ref 3.5–5.0)
ANION GAP: 16 — AB (ref 5–15)
BUN: 110 mg/dL — AB (ref 8–23)
CO2: 14 mmol/L — ABNORMAL LOW (ref 22–32)
Calcium: 7.1 mg/dL — ABNORMAL LOW (ref 8.9–10.3)
Chloride: 103 mmol/L (ref 98–111)
Creatinine, Ser: 14.54 mg/dL — ABNORMAL HIGH (ref 0.61–1.24)
GFR calc Af Amer: 3 mL/min — ABNORMAL LOW (ref >60)
GFR, EST NON AFRICAN AMERICAN: 3 mL/min — AB (ref >60)
Glucose, Bld: 143 mg/dL — ABNORMAL HIGH (ref 70–99)
PHOSPHORUS: 4.9 mg/dL — AB (ref 2.5–4.6)
POTASSIUM: 3.3 mmol/L — AB (ref 3.5–5.1)
Sodium: 133 mmol/L — ABNORMAL LOW (ref 135–145)

## 2018-07-29 LAB — GLUCOSE, CAPILLARY
GLUCOSE-CAPILLARY: 136 mg/dL — AB (ref 70–99)
GLUCOSE-CAPILLARY: 123 mg/dL — AB (ref 70–99)
Glucose-Capillary: 140 mg/dL — ABNORMAL HIGH (ref 70–99)
Glucose-Capillary: 350 mg/dL — ABNORMAL HIGH (ref 70–99)
Glucose-Capillary: 309 mg/dL — ABNORMAL HIGH (ref 70–99)
Glucose-Capillary: 211 mg/dL — ABNORMAL HIGH (ref 70–99)

## 2018-07-29 LAB — HIV ANTIBODY (ROUTINE TESTING W REFLEX): HIV SCREEN 4TH GENERATION: NONREACTIVE

## 2018-07-29 MED ORDER — LIDOCAINE-PRILOCAINE 2.5-2.5 % EX CREA
1.0000 "application " | TOPICAL_CREAM | CUTANEOUS | Status: DC | PRN
  Filled 2018-07-29 (×2): qty 5

## 2018-07-29 MED ORDER — SODIUM CHLORIDE 0.9 % IV BOLUS
250.0000 mL | Freq: Once | INTRAVENOUS | Status: AC
  Administered 2018-07-30: 250 mL via INTRAVENOUS

## 2018-07-29 MED ORDER — SODIUM CHLORIDE 0.9 % IV SOLN: 100.0000 mL | INTRAVENOUS | Status: DC | PRN

## 2018-07-29 MED ORDER — SODIUM CHLORIDE 0.9 % IV BOLUS
250.0000 mL | Freq: Once | INTRAVENOUS | Status: AC
  Administered 2018-07-29: 250 mL via INTRAVENOUS

## 2018-07-29 MED ORDER — CEFAZOLIN SODIUM-DEXTROSE 2-4 GM/100ML-% IV SOLN
2.0000 g | INTRAVENOUS | Status: DC
  Filled 2018-07-29: qty 100

## 2018-07-29 MED ORDER — HEPARIN SODIUM (PORCINE) 1000 UNIT/ML DIALYSIS: 1000.0000 [IU] | INTRAMUSCULAR | Status: DC | PRN

## 2018-07-29 MED ORDER — HEPARIN SODIUM (PORCINE) 1000 UNIT/ML DIALYSIS
4000.0000 [IU] | INTRAMUSCULAR | Status: DC | PRN
  Administered 2018-07-30: 4000 [IU] via INTRAVENOUS_CENTRAL

## 2018-07-29 MED ORDER — ALTEPLASE 2 MG IJ SOLR: 2.0000 mg | Freq: Once | INTRAMUSCULAR | Status: DC | PRN

## 2018-07-29 MED ORDER — ACETAMINOPHEN 325 MG PO TABS
650.0000 mg | ORAL_TABLET | Freq: Four times a day (QID) | ORAL | Status: DC | PRN
  Administered 2018-07-29 – 2018-08-01 (×5): 650 mg via ORAL
  Filled 2018-07-29 (×5): qty 2

## 2018-07-29 MED ORDER — IBUPROFEN 400 MG PO TABS
400.0000 mg | ORAL_TABLET | Freq: Once | ORAL | Status: AC
  Administered 2018-07-29: 400 mg via ORAL
  Filled 2018-07-29: qty 1

## 2018-07-29 MED ORDER — LORAZEPAM 2 MG/ML IJ SOLN
0.5000 mg | Freq: Once | INTRAMUSCULAR | Status: AC
  Administered 2018-07-30: 0.5 mg via INTRAVENOUS
  Filled 2018-07-29: qty 1

## 2018-07-29 MED ORDER — LIDOCAINE HCL (PF) 1 % IJ SOLN
5.0000 mL | INTRAMUSCULAR | Status: DC | PRN
  Filled 2018-07-29: qty 5

## 2018-07-29 MED ORDER — CHLORHEXIDINE GLUCONATE CLOTH 2 % EX PADS
6.0000 | MEDICATED_PAD | Freq: Every day | CUTANEOUS | Status: DC
  Administered 2018-07-30 – 2018-08-02 (×3): 6 via TOPICAL

## 2018-07-29 MED ORDER — PENTAFLUOROPROP-TETRAFLUOROETH EX AERO: 1.0000 "application " | INHALATION_SPRAY | CUTANEOUS | Status: DC | PRN

## 2018-07-29 MED ORDER — DARBEPOETIN ALFA 100 MCG/0.5ML IJ SOSY
100.0000 ug | PREFILLED_SYRINGE | INTRAMUSCULAR | Status: DC
  Administered 2018-07-30: 100 ug via INTRAVENOUS
  Filled 2018-07-29: qty 0.5

## 2018-07-29 NOTE — Progress Notes (Signed)
PT Cancellation Note  Patient Details Name: Roger Vazquez MRN: [id number] DOB: 1951/03/31   Cancelled Treatment:    Reason Eval/Treat Not Completed: Patient at procedure or test/unavailable. Will check back as time allows.    [place] 07/29/2018, 12:33 PM

## 2018-07-29 NOTE — Progress Notes (Signed)
Inpatient Diabetes Program Recommendations  AACE/ADA: New Consensus Statement on Inpatient Glycemic Control (2015)  Target Ranges:  Prepandial:   less than 140 mg/dL      Peak postprandial:   less than 180 mg/dL (1-2 hours)      Critically ill patients:  140 - 180 mg/dL   Lab Results  Component Value Date   GLUCAP 123 (H) 07/29/2018   HGBA1C 8.2 (H) 07/28/2018    Review of Glycemic Control Results for Roger Vazquez, Roger Vazquez (MRN [id number]) as of 07/29/2018 16:00  Ref. Range 07/28/2018 17:10 07/28/2018 18:53 07/29/2018 06:37 07/29/2018 07:43 07/29/2018 12:53  Glucose-Capillary Latest Ref Range: 70 - 99 mg/dL 162 (H) 93 136 (H) 140 (H) 123 (H)   Diabetes history: Type 2 DM/ESRD Outpatient Diabetes medications: None Current orders for Inpatient glycemic control:  Novolog sensitive tid with meals Inpatient Diabetes Program Recommendations:    Referral received for new diagnosis of DM.  Reviewed chart and noted the Type 2 on problem list from 2012.  Patient states that he was on insulin in the past but has not been on medications lately for DM.  Blood sugars currently trending within hospital goals. Patient states that he does not feel well at this time and asked if I can come by another time. Will follow-up on 10/16.   Thanks,  Adah Perl, RN, BC-ADM Inpatient Diabetes Coordinator Pager [phone] (8a-5p)

## 2018-07-29 NOTE — Progress Notes (Addendum)
[facility] Progress Note   Subjective:   Seen and examined in dialysis.  Feeling better today.  Denies SOB, CP, pain, and n/v/d.  Objective Vitals:   07/29/18 1115 07/29/18 1130 07/29/18 1145 07/29/18 1200  BP: (!) 92/48 (!) 90/40 (!) 95/42 (!) 105/49  Pulse:  (!) 110  (!) 102  Resp: (!) 22 20  (!) 21  Temp:      TempSrc:      SpO2:  (!) 84%  100%  Weight:       Physical Exam General:NAD, chronically ill appearing male Heart:RRR, no mrg Lungs:+crackles in b/l bases Abdomen:soft, NTND Extremities:no LE edema Dialysis Access: LU AVG cannulated   Filed Weights   07/27/18 1049 07/29/18 0820  Weight: 62.6 kg 61.2 kg    Intake/Output Summary (Last 24 hours) at 07/29/2018 1322 Last data filed at 07/29/2018 6578 Gross per 24 hour  Intake 1792.94 ml  Output -  Net 1792.94 ml    Additional Objective Labs: Basic Metabolic Panel: Recent Labs  Lab 07/27/18 1529 07/28/18 0450 07/29/18 0830  NA 131* 132* 133*  K 3.8 3.3* 3.3*  CL 96* 99 103  CO2 18* 16* 14*  GLUCOSE 327* 334* 143*  BUN 101* 107* 110*  CREATININE 12.78* 13.66* 14.54*  CALCIUM 8.0* 7.4* 7.1*  PHOS  --   --  4.9*   Liver Function Tests: Recent Labs  Lab 07/27/18 1357 07/27/18 1529 07/29/18 0830  AST 20 42*  --   ALT 11 22  --   ALKPHOS 30* 66  --   BILITOT 0.5 0.8  --   PROT 3.3* 7.2  --   ALBUMIN 1.5* 3.4* 2.7*   CBC: Recent Labs  Lab 07/27/18 1116 07/27/18 1449 07/28/18 0450 07/29/18 0757  WBC 8.7  --  7.1 6.2  NEUTROABS 7.8*  --   --   --   HGB 10.7* 11.9* 9.0* 7.9*  HCT 35.2* 35.0* 28.0* 24.2*  MCV 111.7*  --  105.3* 103.4*  PLT 106*  --  88* 87*   Blood Culture    Component Value Date/Time   SDES BLOOD BLOOD RIGHT ARM 07/27/2018 1111   SPECREQUEST  07/27/2018 1111    BOTTLES DRAWN AEROBIC ONLY Blood Culture results may not be optimal due to an inadequate volume of blood received in culture bottles   CULT STAPHYLOCOCCUS AUREUS (A) 07/27/2018 1111   REPTSTATUS PENDING 07/27/2018 1111   CBG: Recent Labs  Lab 07/28/18 1710 07/28/18 1853 07/29/18 0637 07/29/18 0743 07/29/18 1253  GLUCAP 162* 93 136* 140* 123*   Studies/Results: No results found.  Medications: . sodium chloride    . sodium chloride 75 mL/hr at 07/28/18 1610  . sodium chloride    . sodium chloride    . [START ON 07/30/2018]  ceFAZolin (ANCEF) IV    .  ceFAZolin (ANCEF) IV     . albuterol  2.5 mg Nebulization BID  . aspirin EC  81 mg Oral Daily  . Chlorhexidine Gluconate Cloth  6 each Topical Q0600  . [START ON 07/30/2018] darbepoetin (ARANESP) injection - DIALYSIS  40 mcg Intravenous Q Wed-HD  . heparin  5,000 Units Subcutaneous Q8H  . insulin aspart  0-9 Units Subcutaneous TID WC  . multivitamin with minerals  1 tablet Oral Daily  . pantoprazole  40 mg Oral BID  . sodium chloride flush  3 mL Intravenous Q12H    Dialysis Orders: MWF  3h 42min  400/AF1.5   59kg  2K/3.5Ca  P4  Hep 4000    - venofer 50/wk  - mircera 30 ug last 10/2  Home meds:  - amlodipine 5/ hydralazine 50 tid  - pantoprazole 40 bid/ MVI/ aspirin 81 mg  - hydrocodone- aceta prn/ methocarbamol 500 bid prn  Assessment/Plan: 1. MSSA bacteremia : tmax 100.15F in last 24hrs, BC +MSSA. On Ancef. ID evaluating, repeat BC pending. TEE ordered. 2. Dialysis access infection - purulent drainage near recent stitches.  VVS consulted and saw no signs of infection.  Sutures removed. Access ok to use.  [place] of access site to determine if underlying fluid collection per ID. 3. ESRD - on HD MWF.  K 3.3. HD postponed until today due to urgent patients. Plan to resume regular schedule tomorrow, orders written. 4. Anemia of CKD - Hgb 9.0>7.9. ^ESA dose - 55mcg qwk on Wed.  5. Metabolic bone disease - CCa and phos in goal. Not on VDRA, binders.   6. Nutrition - Alb 2.7. Renal diet w/fluid restrictions once advanced. Nepro. Renavite. 7. HTN/Volume - BP soft, on prn hydralazine. +crackles on exam, 2L over EDW  per standing weights. Net UF goal today 3.5L. Will likely need new edw at d/c. 8. GERD 9. Hx cocaine/ETOH abuse 10. Hx Hep C  Jen Mow, PA-C [facility] Pager: [phone] 07/29/2018,1:22 PM  LOS: 2 days   Pt seen, examined and agree w A/P as above.  Kelly Splinter MD [employer] pager [phone]   07/29/2018, 2:10 PM

## 2018-07-29 NOTE — Care Management Important Message (Signed)
Important Message  Patient Details  Name: Roger Vazquez MRN: [id number] Date of Birth: 1951/01/06   Medicare Important Message Given:  Yes    Mateja Dier Montine Circle 07/29/2018, 3:48 PM

## 2018-07-29 NOTE — Progress Notes (Signed)
Pts temperature had come down to 101.5. When rechecked 30 min later it went back up to 103. PRN Tyelenol given and more icepacks applied. Completed 1 250 cc bolus per MD's order. MD notified of status change. Will continue to monitor.

## 2018-07-29 NOTE — Progress Notes (Signed)
Pts temp has come down to 97.9 but BP is now 91/44. 250 bolus x2 has already been given. Pt is restless. MD notified. Will continue to monitor.

## 2018-07-29 NOTE — Progress Notes (Signed)
Pt has temperature of 102.5. BS 305 BP 147/72 Ice pack and cool washcloth applied to pt. MD notified. Will continue to monitor

## 2018-07-29 NOTE — Progress Notes (Addendum)
PROGRESS NOTE                                                                                                                                                                                                             Patient Demographics:    Roger Vazquez, is a 67 y.o. male, DOB - 05/20/1951, JQB:[id number]  Admit date - 07/27/2018   Admitting Physician Merton Border, MD  Outpatient Primary MD for the patient is [facility]  LOS - 2  Chief Complaint  Patient presents with  . Altered/Sepsis       Brief Narrative  Roger Vazquez  is a 67 y.o. male, with past medical history significant for end-stage renal disease on hemodialysis who was seen good last on Friday, sent to our emergency room for evaluation due to altered mental status and fever.  Patient was found covered with stool and had a high fever upon admission, with no obvious source, further work-up showed that he had MSSA bacteremia.   Subjective:   Patient in bed, appears comfortable, denies any headache, no fever, no chest pain or pressure, no shortness of breath , no abdominal pain. No focal weakness.   Assessment  & Plan :     1.  Sepsis with MSSA bacteremia.  Source unclear.  Does have a mild cough and C. difficile negative diarrhea, left arm dialysis graft site stable on exam.  Currently on Ancef, ID on board.  TTE stable. TEE and L. AV Graft [place] repeat surveillance cultures ordered.  Clinically sepsis physiology seems to have resolved will monitor, ID following, also seen by VVS and Renal.  2.  ESRD.  Dialysis schedule Monday, Wednesday and Friday. Renal on board.  3.  History of cocaine and alcohol abuse.  Says he has quit both for several years.  Will monitor.  4.  History of Hepatitis C.  Outpatient GI/ID follow-up as needed been no acute issues.  5.  Diarrhea.  C. difficile negative.  Resolved with PRN Imodium.  6.  Essential hypertension.  Pressure currently soft, PRN hydralazine only and  monitor.  7.  GERD.  On PPI.  8. Hyperglycemia -have undiagnosed DM type II, placed on sliding scale continue to monitor.  Will require diabetic education prior to discharge.  Lab Results  Component Value Date   HGBA1C 8.2 (H) 07/28/2018   CBG (last 3)  Recent Labs    07/28/18 1853 07/29/18 0637 07/29/18 0743  GLUCAP 93 136* 140*  Family Communication  :  neighbor   Code Status :  Full  Disposition Plan  :  TBD  Consults  :  ID, Renal  Procedures  :    TEE  L.ARM Graft [place]  TTE -   - Left ventricle: The cavity size was normal. Wall thickness was increased in a pattern of mild LVH. Systolic function was normal. The estimated ejection fraction was in the range of 60% to 65%. Wall motion was normal; there were no regional wall motion abnormalities. - Aortic valve: Transvalvular velocity was within the normal range. There was no stenosis. There was no regurgitation. - Mitral valve: Transvalvular velocity was within the normal range. There was no evidence for stenosis. There was no regurgitation. - Left atrium: The atrium was mildly dilated. - Right ventricle: The cavity size was normal. Wall thickness was normal. Systolic function was normal. - Atrial septum: No defect or patent foramen ovale was identified by color flow Doppler. - Tricuspid valve: There was trivial regurgitation. - Pulmonary arteries: Systolic pressure was within the normal range. PA peak pressure: 20 mm Hg (S).  DVT Prophylaxis  :   Heparin    Lab Results  Component Value Date   PLT 87 (L) 07/29/2018    Diet :  Diet Order            Diet clear liquid Room service appropriate? Yes; Fluid consistency: Thin  Diet effective now               Inpatient Medications Scheduled Meds: . albuterol  2.5 mg Nebulization BID  . aspirin EC  81 mg Oral Daily  . Chlorhexidine Gluconate Cloth  6 each Topical Q0600  . [START ON 07/30/2018] darbepoetin (ARANESP) injection - DIALYSIS  40 mcg  Intravenous Q Wed-HD  . heparin  5,000 Units Subcutaneous Q8H  . insulin aspart  0-9 Units Subcutaneous TID WC  . multivitamin with minerals  1 tablet Oral Daily  . pantoprazole  40 mg Oral BID  . sodium chloride flush  3 mL Intravenous Q12H   Continuous Infusions: . sodium chloride    . sodium chloride 75 mL/hr at 07/28/18 1610  . sodium chloride    . sodium chloride    . [START ON 07/30/2018]  ceFAZolin (ANCEF) IV    .  ceFAZolin (ANCEF) IV     PRN Meds:.sodium chloride, sodium chloride, sodium chloride, acetaminophen, albuterol, alteplase, chlorproMAZINE, heparin, heparin, lidocaine (PF), lidocaine-prilocaine, loperamide, ondansetron **OR** ondansetron (ZOFRAN) IV, pentafluoroprop-tetrafluoroeth, sodium chloride flush  Antibiotics  :   Anti-infectives (From admission, onward)   Start     Dose/Rate Route Frequency Ordered Stop   07/30/18 1200  ceFAZolin (ANCEF) IVPB 2g/100 mL premix     2 g 200 mL/hr over 30 Minutes Intravenous Every M-W-F (Hemodialysis) 07/28/18 1329     07/29/18 1200  ceFAZolin (ANCEF) IVPB 2g/100 mL premix     2 g 200 mL/hr over 30 Minutes Intravenous  Once 07/28/18 1329     07/28/18 1800  ceFEPIme (MAXIPIME) 2 g in sodium chloride 0.9 % 100 mL IVPB  Status:  Discontinued     2 g 200 mL/hr over 30 Minutes Intravenous Every M-W-F (Hemodialysis) 07/27/18 1239 07/28/18 0913   07/28/18 1200  vancomycin (VANCOCIN) IVPB 750 mg/150 ml premix  Status:  Discontinued     750 mg 150 mL/hr over 60 Minutes Intravenous Every M-W-F (Hemodialysis) 07/27/18 1239 07/28/18 0913   07/28/18 1200  ceFAZolin (ANCEF) IVPB 2g/100 mL premix  Status:  Discontinued     2 g 200 mL/hr over 30 Minutes Intravenous Every M-W-F (Hemodialysis) 07/28/18 0913 07/28/18 1329   07/27/18 1115  ceFEPIme (MAXIPIME) 2 g in sodium chloride 0.9 % 100 mL IVPB     2 g 200 mL/hr over 30 Minutes Intravenous  Once 07/27/18 1114 07/27/18 1257   07/27/18 1115  metroNIDAZOLE (FLAGYL) IVPB 500 mg  Status:   Discontinued     500 mg 100 mL/hr over 60 Minutes Intravenous Every 8 hours 07/27/18 1114 07/28/18 0913   07/27/18 1115  vancomycin (VANCOCIN) IVPB 1000 mg/200 mL premix     1,000 mg 200 mL/hr over 60 Minutes Intravenous  Once 07/27/18 1114 07/27/18 1427          Objective:   Vitals:   07/29/18 1015 07/29/18 1030 07/29/18 1045 07/29/18 1100  BP: (!) 109/54 (!) 111/54 (!) 122/59 (!) 93/49  Pulse: 87 94 86 85  Resp: (!) 22 15 (!) 25 (!) 25  Temp:      TempSrc:      SpO2: 100% 100% 99% 100%  Weight:        Wt Readings from Last 3 Encounters:  07/29/18 61.2 kg  09/13/14 62.6 kg  10/12/11 69.3 kg     Intake/Output Summary (Last 24 hours) at 07/29/2018 1117 Last data filed at 07/29/2018 5732 Gross per 24 hour  Intake 1792.94 ml  Output -  Net 1792.94 ml     Physical Exam  Awake Alert, Oriented X 3, No new F.N deficits, Normal affect [place].AT,PERRAL Supple Neck,No JVD, No cervical lymphadenopathy appriciated.  Symmetrical Chest wall movement, Good air movement bilaterally, CTAB RRR,No Gallops,Rubs or new Murmurs, No Parasternal Heave +ve B.Sounds, Abd Soft, No tenderness, No organomegaly appriciated, No rebound - guarding or rigidity. No Cyanosis, Clubbing or edema, L arm Graft under bandage - not warm, + ve bruit    Data Review:    CBC Recent Labs  Lab 07/27/18 1116 07/27/18 1449 07/28/18 0450 07/29/18 0757  WBC 8.7  --  7.1 6.2  HGB 10.7* 11.9* 9.0* 7.9*  HCT 35.2* 35.0* 28.0* 24.2*  PLT 106*  --  88* 87*  MCV 111.7*  --  105.3* 103.4*  MCH 34.0  --  33.8 33.8  MCHC 30.4  --  32.1 32.6  RDW 15.2  --  14.8 14.9  LYMPHSABS 0.5*  --   --   --   MONOABS 0.3  --   --   --   EOSABS 0.0  --   --   --   BASOSABS 0.0  --   --   --     Chemistries  Recent Labs  Lab 07/27/18 1357 07/27/18 1449 07/27/18 1529 07/28/18 0450 07/29/18 0830  NA 141 132* 131* 132* 133*  K UNABLE TO OBTAIN RESULT APPEARS EDTA CONTAMINANT 3.9 3.8 3.3* 3.3*  CL 125* 96* 96* 99  103  CO2 9*  --  18* 16* 14*  GLUCOSE 202* 331* 327* 334* 143*  BUN 52* 105* 101* 107* 110*  CREATININE 6.09* 14.00* 12.78* 13.66* 14.54*  CALCIUM  UNABLE TO OBTAIN RESULTS APPEARS EDTA CONTAMINANT  --  8.0* 7.4* 7.1*  AST 20  --  42*  --   --   ALT 11  --  22  --   --   ALKPHOS 30*  --  66  --   --   BILITOT 0.5  --  0.8  --   --    ------------------------------------------------------------------------------------------------------------------ No results for  input(s): CHOL, HDL, LDLCALC, TRIG, CHOLHDL, LDLDIRECT in the last 72 hours.  Lab Results  Component Value Date   HGBA1C 8.2 (H) 07/28/2018   ------------------------------------------------------------------------------------------------------------------ No results for input(s): TSH, T4TOTAL, T3FREE, THYROIDAB in the last 72 hours.  Invalid input(s): FREET3 ------------------------------------------------------------------------------------------------------------------ No results for input(s): VITAMINB12, FOLATE, FERRITIN, TIBC, IRON, RETICCTPCT in the last 72 hours.  Coagulation profile Recent Labs  Lab 07/27/18 1116  INR 0.99    No results for input(s): DDIMER in the last 72 hours.  Cardiac Enzymes No results for input(s): CKMB, TROPONINI, MYOGLOBIN in the last 168 hours.  Invalid input(s): CK ------------------------------------------------------------------------------------------------------------------ No results found for: BNP  Micro Results Recent Results (from the past 240 hour(s))  Culture, blood (Routine x 2)     Status: Abnormal (Preliminary result)   Collection Time: 07/27/18 11:04 AM  Result Value Ref Range Status   Specimen Description BLOOD BLOOD RIGHT FOREARM  Final   Special Requests   Final    BOTTLES DRAWN AEROBIC ONLY Blood Culture adequate volume   Culture  Setup Time   Final    AEROBIC BOTTLE ONLY GRAM POSITIVE COCCI CRITICAL RESULT CALLED TO, READ BACK BY AND VERIFIED WITH:  J.LEDFORD,PHARMD AT 0500 ON 07/28/18 BY G.MCADOO    Culture (A)  Final    STAPHYLOCOCCUS AUREUS SUSCEPTIBILITIES TO FOLLOW Performed at [facility], [place] [address], [place], [place] [address]    Report Status PENDING  Incomplete  Blood Culture ID Panel (Reflexed)     Status: Abnormal   Collection Time: 07/27/18 11:04 AM  Result Value Ref Range Status   Enterococcus species NOT DETECTED NOT DETECTED Final   Listeria monocytogenes NOT DETECTED NOT DETECTED Final   Staphylococcus species DETECTED (A) NOT DETECTED Final    Comment: CRITICAL RESULT CALLED TO, READ BACK BY AND VERIFIED WITH: J.LEDFORD,PHARMD AT 0500 ON 07/28/18 BY G.MCADOO    Staphylococcus aureus (BCID) DETECTED (A) NOT DETECTED Final    Comment: CRITICAL RESULT CALLED TO, READ BACK BY AND VERIFIED WITH: J.LEDFORD,PHARMD AT 0500 ON 07/28/18 BY G.MCADOO Methicillin (oxacillin) susceptible Staphylococcus aureus (MSSA). Preferred therapy is anti staphylococcal beta lactam antibiotic (Cefazolin or Nafcillin), unless clinically contraindicated.    Methicillin resistance NOT DETECTED NOT DETECTED Final   Streptococcus species NOT DETECTED NOT DETECTED Final   Streptococcus agalactiae NOT DETECTED NOT DETECTED Final   Streptococcus pneumoniae NOT DETECTED NOT DETECTED Final   Streptococcus pyogenes NOT DETECTED NOT DETECTED Final   Acinetobacter baumannii NOT DETECTED NOT DETECTED Final   Enterobacteriaceae species NOT DETECTED NOT DETECTED Final   Enterobacter cloacae complex NOT DETECTED NOT DETECTED Final   Escherichia coli NOT DETECTED NOT DETECTED Final   Klebsiella oxytoca NOT DETECTED NOT DETECTED Final   Klebsiella pneumoniae NOT DETECTED NOT DETECTED Final   Proteus species NOT DETECTED NOT DETECTED Final   Serratia marcescens NOT DETECTED NOT DETECTED Final   Haemophilus influenzae NOT DETECTED NOT DETECTED Final   Neisseria meningitidis NOT DETECTED NOT DETECTED Final   Pseudomonas aeruginosa NOT DETECTED  NOT DETECTED Final   Candida albicans NOT DETECTED NOT DETECTED Final   Candida glabrata NOT DETECTED NOT DETECTED Final   Candida krusei NOT DETECTED NOT DETECTED Final   Candida parapsilosis NOT DETECTED NOT DETECTED Final   Candida tropicalis NOT DETECTED NOT DETECTED Final    Comment: Performed at [facility], [place] [address] [place], [place] [address]  Culture, blood (Routine x 2)     Status: Abnormal (Preliminary result)   Collection Time:  07/27/18 11:11 AM  Result Value Ref Range Status   Specimen Description BLOOD BLOOD RIGHT ARM  Final   Special Requests   Final    BOTTLES DRAWN AEROBIC ONLY Blood Culture results may not be optimal due to an inadequate volume of blood received in culture bottles   Culture  Setup Time   Final    GRAM POSITIVE COCCI IN CLUSTERS AEROBIC BOTTLE ONLY CRITICAL VALUE NOTED.  VALUE IS CONSISTENT WITH PREVIOUSLY REPORTED AND CALLED VALUE. Performed at [facility], [place] [address], [place], [place] [address]    Culture STAPHYLOCOCCUS AUREUS (A)  Final   Report Status PENDING  Incomplete  C difficile quick scan w PCR reflex     Status: None   Collection Time: 07/27/18  9:28 PM  Result Value Ref Range Status   C Diff antigen NEGATIVE NEGATIVE Final   C Diff toxin NEGATIVE NEGATIVE Final   C Diff interpretation No C. difficile detected.  Final    Comment: Performed at [facility], [place] [address], [place], [place] [address]  MRSA PCR Screening     Status: None   Collection Time: 07/28/18  1:30 AM  Result Value Ref Range Status   MRSA by PCR NEGATIVE NEGATIVE Final    Comment:        The GeneXpert MRSA Assay (FDA approved for NASAL specimens only), is one component of a comprehensive MRSA colonization surveillance program. It is not intended to diagnose MRSA infection nor to guide or monitor treatment for MRSA infections. Performed at [facility], [place] [address] [place],  [address]     Radiology Reports Dg  Chest Portable 1 View  Result Date: 07/27/2018 CLINICAL DATA:  Sepsis. Fever and altered mental status. Hypertension and diabetes. Ex-smoker. EXAM: PORTABLE CHEST 1 VIEW COMPARISON:  02/14/2011 FINDINGS: Left axillary vascular stent. Numerous leads and wires project over the chest. Midline trachea. Normal heart size. Atherosclerosis in the transverse aorta. No pleural effusion or pneumothorax. Mild left hemidiaphragm elevation. Clear lungs. Interval but nonacute left rib deformities could be posttraumatic or postsurgical. IMPRESSION: No acute findings. Aortic Atherosclerosis (ICD10-I70.0). Electronically Signed   By: Abigail Miyamoto M.D.   On: 07/27/2018 12:10    Time Spent in minutes  30   Lala Lund M.D on 07/29/2018 at 11:17 AM  To page go to [email or web address] - password [facility]

## 2018-07-29 NOTE — Progress Notes (Signed)
[facility]  Date of Admission:  07/27/2018     Total days of antibiotics 3         ASSESSMENT/PLAN  Roger Vazquez has MSSA bacteremia of unclear source. TTE does not reveal vegetation and there is no obvious signs of infection from his fistula site, although Dr. Melvia Heaps was able to obtain purulent drainage. Awaiting ultrasound results to determine underlying fluid collection.  1. Continue cefazolin. 2. Await repeat blood cultures. 3. TEE order placed.  Principal Problem:   Bacteremia due to methicillin susceptible Staphylococcus aureus (MSSA) Active Problems:   HEPATITIS C   Essential hypertension   Sepsis ([place])   . albuterol  2.5 mg Nebulization BID  . aspirin EC  81 mg Oral Daily  . Chlorhexidine Gluconate Cloth  6 each Topical Q0600  . [START ON 07/30/2018] darbepoetin (ARANESP) injection - DIALYSIS  40 mcg Intravenous Q Wed-HD  . heparin  5,000 Units Subcutaneous Q8H  . insulin aspart  0-9 Units Subcutaneous TID WC  . multivitamin with minerals  1 tablet Oral Daily  . pantoprazole  40 mg Oral BID  . sodium chloride flush  3 mL Intravenous Q12H    SUBJECTIVE:  Mild elevated temperature of 100.1 overnight with stable white blood cell count. TTE without evidence of vegetation. Scheduled for dialysis today. Awaiting collection of repeat blood cultures.   No Known Allergies   Review of Systems: ROS Please see HPI. All other systems reviewed and negative.   OBJECTIVE: Vitals:   07/29/18 1000 07/29/18 1015 07/29/18 1030 07/29/18 1045  BP: (!) 92/48 (!) 109/54 (!) 111/54 (!) 122/59  Pulse: 87 87 94 86  Resp: 20 (!) 22 15 (!) 25  Temp:      TempSrc:      SpO2: 100% 100% 100% 99%  Weight:       Body mass index is 21.78 kg/m.  Physical Exam  Constitutional: He is oriented to person, place, and time. He appears well-developed. No distress.  Cardiovascular: Normal rate, regular rhythm and intact distal pulses.  Murmur  heard. Pulmonary/Chest: Effort normal and breath sounds normal. No stridor. No respiratory distress. He has no wheezes. He has no rales.  Abdominal: Soft. Bowel sounds are normal. He exhibits no distension. There is no tenderness.  Neurological: He is alert and oriented to person, place, and time.  Skin: Skin is warm and dry.  Psychiatric: He has a normal mood and affect. His behavior is normal. Judgment and thought content normal.    Lab Results Lab Results  Component Value Date   WBC 6.2 07/29/2018   HGB 7.9 (L) 07/29/2018   HCT 24.2 (L) 07/29/2018   MCV 103.4 (H) 07/29/2018   PLT 87 (L) 07/29/2018    Lab Results  Component Value Date   CREATININE 14.54 (H) 07/29/2018   BUN 110 (H) 07/29/2018   NA 133 (L) 07/29/2018   K 3.3 (L) 07/29/2018   CL 103 07/29/2018   CO2 14 (L) 07/29/2018    Lab Results  Component Value Date   ALT 22 07/27/2018   AST 42 (H) 07/27/2018   ALKPHOS 66 07/27/2018   BILITOT 0.8 07/27/2018     Microbiology: Recent Results (from the past 240 hour(s))  Culture, blood (Routine x 2)     Status: Abnormal (Preliminary result)   Collection Time: 07/27/18 11:04 AM  Result Value Ref Range Status   Specimen Description BLOOD BLOOD RIGHT FOREARM  Final   Special Requests   Final  BOTTLES DRAWN AEROBIC ONLY Blood Culture adequate volume   Culture  Setup Time   Final    AEROBIC BOTTLE ONLY GRAM POSITIVE COCCI CRITICAL RESULT CALLED TO, READ BACK BY AND VERIFIED WITH: J.LEDFORD,PHARMD AT 0500 ON 07/28/18 BY G.MCADOO    Culture (A)  Final    STAPHYLOCOCCUS AUREUS SUSCEPTIBILITIES TO FOLLOW Performed at [facility], [place] [address] [place], [place] [address]    Report Status PENDING  Incomplete  Blood Culture ID Panel (Reflexed)     Status: Abnormal   Collection Time: 07/27/18 11:04 AM  Result Value Ref Range Status   Enterococcus species NOT DETECTED NOT DETECTED Final   Listeria monocytogenes NOT DETECTED NOT DETECTED Final   Staphylococcus  species DETECTED (A) NOT DETECTED Final    Comment: CRITICAL RESULT CALLED TO, READ BACK BY AND VERIFIED WITH: J.LEDFORD,PHARMD AT 0500 ON 07/28/18 BY G.MCADOO    Staphylococcus aureus (BCID) DETECTED (A) NOT DETECTED Final    Comment: CRITICAL RESULT CALLED TO, READ BACK BY AND VERIFIED WITH: J.LEDFORD,PHARMD AT 0500 ON 07/28/18 BY G.MCADOO Methicillin (oxacillin) susceptible Staphylococcus aureus (MSSA). Preferred therapy is anti staphylococcal beta lactam antibiotic (Cefazolin or Nafcillin), unless clinically contraindicated.    Methicillin resistance NOT DETECTED NOT DETECTED Final   Streptococcus species NOT DETECTED NOT DETECTED Final   Streptococcus agalactiae NOT DETECTED NOT DETECTED Final   Streptococcus pneumoniae NOT DETECTED NOT DETECTED Final   Streptococcus pyogenes NOT DETECTED NOT DETECTED Final   Acinetobacter baumannii NOT DETECTED NOT DETECTED Final   Enterobacteriaceae species NOT DETECTED NOT DETECTED Final   Enterobacter cloacae complex NOT DETECTED NOT DETECTED Final   Escherichia coli NOT DETECTED NOT DETECTED Final   Klebsiella oxytoca NOT DETECTED NOT DETECTED Final   Klebsiella pneumoniae NOT DETECTED NOT DETECTED Final   Proteus species NOT DETECTED NOT DETECTED Final   Serratia marcescens NOT DETECTED NOT DETECTED Final   Haemophilus influenzae NOT DETECTED NOT DETECTED Final   Neisseria meningitidis NOT DETECTED NOT DETECTED Final   Pseudomonas aeruginosa NOT DETECTED NOT DETECTED Final   Candida albicans NOT DETECTED NOT DETECTED Final   Candida glabrata NOT DETECTED NOT DETECTED Final   Candida krusei NOT DETECTED NOT DETECTED Final   Candida parapsilosis NOT DETECTED NOT DETECTED Final   Candida tropicalis NOT DETECTED NOT DETECTED Final    Comment: Performed at [facility], [place] [address], [place], [place] [address]  Culture, blood (Routine x 2)     Status: Abnormal (Preliminary result)   Collection Time: 07/27/18 11:11 AM  Result Value Ref  Range Status   Specimen Description BLOOD BLOOD RIGHT ARM  Final   Special Requests   Final    BOTTLES DRAWN AEROBIC ONLY Blood Culture results may not be optimal due to an inadequate volume of blood received in culture bottles   Culture  Setup Time   Final    GRAM POSITIVE COCCI IN CLUSTERS AEROBIC BOTTLE ONLY CRITICAL VALUE NOTED.  VALUE IS CONSISTENT WITH PREVIOUSLY REPORTED AND CALLED VALUE. Performed at [facility], [place] [address] [place], [place] [address]    Culture STAPHYLOCOCCUS AUREUS (A)  Final   Report Status PENDING  Incomplete  C difficile quick scan w PCR reflex     Status: None   Collection Time: 07/27/18  9:28 PM  Result Value Ref Range Status   C Diff antigen NEGATIVE NEGATIVE Final   C Diff toxin NEGATIVE NEGATIVE Final   C Diff interpretation No C. difficile detected.  Final  Comment: Performed at [facility], [place] [address], [place], [place] [address]  MRSA PCR Screening     Status: None   Collection Time: 07/28/18  1:30 AM  Result Value Ref Range Status   MRSA by PCR NEGATIVE NEGATIVE Final    Comment:        The GeneXpert MRSA Assay (FDA approved for NASAL specimens only), is one component of a comprehensive MRSA colonization surveillance program. It is not intended to diagnose MRSA infection nor to guide or monitor treatment for MRSA infections. Performed at [facility], [place] [address] [place], [place] [address]      Terri Piedra, [place] [facility] [phone] Pager  07/29/2018  10:59 AM

## 2018-07-29 NOTE — Progress Notes (Signed)
    [facility] has been requested to perform a transesophageal echocardiogram on Roger Vazquez for bacteremia.  After careful review of history and examination, the risks and benefits of transesophageal echocardiogram have been explained including risks of esophageal damage, perforation (1:10,000 risk), bleeding, pharyngeal hematoma as well as other potential complications associated with conscious sedation including aspiration, arrhythmia, respiratory failure and death. Alternatives to treatment were discussed, questions were answered. Patient is willing to proceed.   Lyda Jester, PA-C  07/29/2018 4:12 PM

## 2018-07-29 NOTE — Progress Notes (Signed)
Pt completing 2nd 250 cc bolus of NS. Temperature 99.5. Will continue to monitor.

## 2018-07-29 NOTE — Progress Notes (Signed)
[facility]  Subjective  - feels better   Objective 124/81 77 (!) 101 F (38.3 C) (Oral) 17 92%  Intake/Output Summary (Last 24 hours) at 07/29/2018 1411 Last data filed at 07/29/2018 9244 Gross per 24 hour  Intake 1792.94 ml  Output -  Net 1792.94 ml   Left upper arm access patent no erythema or drainage  Assessment/Planning: Left upper arm access no pointing signs of infection  Apparently access was placed at [facility]. Will try to get records so that we know if there is any prosthetic.  Ruta Hinds 07/29/2018 2:11 PM --  Laboratory Lab Results: Recent Labs    07/28/18 0450 07/29/18 0757  WBC 7.1 6.2  HGB 9.0* 7.9*  HCT 28.0* 24.2*  PLT 88* 87*   BMET Recent Labs    07/28/18 0450 07/29/18 0830  NA 132* 133*  K 3.3* 3.3*  CL 99 103  CO2 16* 14*  GLUCOSE 334* 143*  BUN 107* 110*  CREATININE 13.66* 14.54*  CALCIUM 7.4* 7.1*    COAG Lab Results  Component Value Date   INR 0.99 07/27/2018   INR 0.97 10/07/2011   INR 0.98 02/15/2011   No results found for: PTT

## 2018-07-30 ENCOUNTER — Inpatient Hospital Stay (HOSPITAL_COMMUNITY): Payer: Medicare PPO

## 2018-07-30 DIAGNOSIS — H5462 Unqualified visual loss, left eye, normal vision right eye: Secondary | ICD-10-CM

## 2018-07-30 DIAGNOSIS — H9192 Unspecified hearing loss, left ear: Secondary | ICD-10-CM

## 2018-07-30 LAB — CBC
HEMATOCRIT: 25.1 % — AB (ref 39.0–52.0)
HEMOGLOBIN: 8.1 g/dL — AB (ref 13.0–17.0)
MCH: 33.5 pg (ref 26.0–34.0)
MCHC: 32.3 g/dL (ref 30.0–36.0)
MCV: 103.7 fL — ABNORMAL HIGH (ref 80.0–100.0)
Platelets: 82 10*3/uL — ABNORMAL LOW (ref 150–400)
RBC: 2.42 MIL/uL — ABNORMAL LOW (ref 4.22–5.81)
RDW: 14.7 % (ref 11.5–15.5)
WBC: 7 10*3/uL (ref 4.0–10.5)
nRBC: 0 % (ref 0.0–0.2)

## 2018-07-30 LAB — CULTURE, BLOOD (ROUTINE X 2): Special Requests: ADEQUATE

## 2018-07-30 LAB — RENAL FUNCTION PANEL
ALBUMIN: 2.6 g/dL — AB (ref 3.5–5.0)
ANION GAP: 15 (ref 5–15)
BUN: 44 mg/dL — ABNORMAL HIGH (ref 8–23)
CO2: 18 mmol/L — ABNORMAL LOW (ref 22–32)
Calcium: 7 mg/dL — ABNORMAL LOW (ref 8.9–10.3)
Chloride: 101 mmol/L (ref 98–111)
Creatinine, Ser: 8.64 mg/dL — ABNORMAL HIGH (ref 0.61–1.24)
GFR calc non Af Amer: 6 mL/min — ABNORMAL LOW (ref >60)
GFR, EST AFRICAN AMERICAN: 6 mL/min — AB (ref >60)
GLUCOSE: 198 mg/dL — AB (ref 70–99)
PHOSPHORUS: 4 mg/dL (ref 2.5–4.6)
Potassium: 3 mmol/L — ABNORMAL LOW (ref 3.5–5.1)
Sodium: 134 mmol/L — ABNORMAL LOW (ref 135–145)

## 2018-07-30 LAB — HCV RNA QUANT
HCV QUANT: 1300000 [IU]/mL (ref >50)
HCV Quantitative Log: 6.114 log10 IU/mL (ref >1.70)

## 2018-07-30 LAB — GLUCOSE, CAPILLARY
GLUCOSE-CAPILLARY: 300 mg/dL — AB (ref 70–99)
GLUCOSE-CAPILLARY: 201 mg/dL — AB (ref 70–99)
Glucose-Capillary: 178 mg/dL — ABNORMAL HIGH (ref 70–99)
Glucose-Capillary: 135 mg/dL — ABNORMAL HIGH (ref 70–99)

## 2018-07-30 LAB — HEPATITIS B SURFACE ANTIGEN: HEP B S AG: NEGATIVE

## 2018-07-30 LAB — HIV ANTIBODY (ROUTINE TESTING W REFLEX): HIV Screen 4th Generation wRfx: NONREACTIVE

## 2018-07-30 MED ORDER — DARBEPOETIN ALFA 100 MCG/0.5ML IJ SOSY
PREFILLED_SYRINGE | INTRAMUSCULAR | Status: AC
  Administered 2018-07-30: 100 ug via INTRAVENOUS
  Filled 2018-07-30: qty 0.5

## 2018-07-30 MED ORDER — SODIUM CHLORIDE 0.9 % IV SOLN: 100.0000 mL | INTRAVENOUS | Status: DC | PRN

## 2018-07-30 MED ORDER — HEPARIN SODIUM (PORCINE) 1000 UNIT/ML IJ SOLN
INTRAMUSCULAR | Status: AC
  Filled 2018-07-30: qty 4

## 2018-07-30 MED ORDER — HEPARIN SODIUM (PORCINE) 1000 UNIT/ML DIALYSIS: 1000.0000 [IU] | INTRAMUSCULAR | Status: DC | PRN

## 2018-07-30 MED ORDER — HEPARIN SODIUM (PORCINE) 1000 UNIT/ML DIALYSIS: 4000.0000 [IU] | INTRAMUSCULAR | Status: DC | PRN

## 2018-07-30 NOTE — Progress Notes (Signed)
Unable to do TEE today as pt in dialysis.  Scheduled for tomorrow with Dr. Debara Pickett.

## 2018-07-30 NOTE — Progress Notes (Signed)
Pt given 3rd 250 cc bolus of NS. BP is 101/49. Pt is calmly resting. Will continue to monitor. MD made aware. Will continue to monitor.

## 2018-07-30 NOTE — Progress Notes (Signed)
Patient scheduled for TEE @ 1100 today, but patient in dialysis until 1230. Patient's TEE rescheduled for 10.17.19 @ 0900. Bedside RN made aware.

## 2018-07-30 NOTE — Progress Notes (Signed)
[facility] Progress Note   Subjective:   On HD feeling much better  Objective Vitals:   07/30/18 1000 07/30/18 1030 07/30/18 1100 07/30/18 1130  BP: (!) 110/44 (!) 106/52 (!) 127/47 (!) 112/41  Pulse: 77 81 81 80  Resp:      Temp:      TempSrc:      SpO2:      Weight:       Physical Exam General:NAD, chronically ill appearing male Heart:RRR, no mrg Lungs: clear bilat Abdomen:soft, NTND Extremities:no LE edema Dialysis Access: LU AVG cannulated , stitches out, no purulent drainage from wounds  Filed Weights   07/29/18 0820 07/29/18 1230 07/30/18 0700  Weight: 61.2 kg 58.2 kg 60.1 kg    Intake/Output Summary (Last 24 hours) at 07/30/2018 1210 Last data filed at 07/30/2018 0302 Gross per 24 hour  Intake 440.62 ml  Output 3000 ml  Net -2559.38 ml    Additional Objective Labs: Basic Metabolic Panel: Recent Labs  Lab 07/28/18 0450 07/29/18 0830 07/30/18 0833  NA 132* 133* 134*  K 3.3* 3.3* 3.0*  CL 99 103 101  CO2 16* 14* 18*  GLUCOSE 334* 143* 198*  BUN 107* 110* 44*  CREATININE 13.66* 14.54* 8.64*  CALCIUM 7.4* 7.1* 7.0*  PHOS  --  4.9* 4.0   Liver Function Tests: Recent Labs  Lab 07/27/18 1357 07/27/18 1529 07/29/18 0830 07/30/18 0833  AST 20 42*  --   --   ALT 11 22  --   --   ALKPHOS 30* 66  --   --   BILITOT 0.5 0.8  --   --   PROT 3.3* 7.2  --   --   ALBUMIN 1.5* 3.4* 2.7* 2.6*   CBC: Recent Labs  Lab 07/27/18 1116  07/28/18 0450 07/29/18 0757 07/30/18 0832  WBC 8.7  --  7.1 6.2 7.0  NEUTROABS 7.8*  --   --   --   --   HGB 10.7*   < > 9.0* 7.9* 8.1*  HCT 35.2*   < > 28.0* 24.2* 25.1*  MCV 111.7*  --  105.3* 103.4* 103.7*  PLT 106*  --  88* 87* 82*   < > = values in this interval not displayed.   Blood Culture    Component Value Date/Time   SDES BLOOD RIGHT FOREARM 07/29/2018 1707   SPECREQUEST  07/29/2018 1707    BOTTLES DRAWN AEROBIC ONLY Blood Culture adequate volume   CULT  07/29/2018 1707    NO GROWTH <  24 HOURS Performed at [facility], [place] [address], [place], [place] [address]    REPTSTATUS PENDING 07/29/2018 1707   CBG: Recent Labs  Lab 07/29/18 1253 07/29/18 1709 07/29/18 2001 07/29/18 2237 07/30/18 0709  GLUCAP 123* 350* 309* 211* 178*   Studies/Results: No results found.  Medications: . sodium chloride    . sodium chloride 75 mL/hr at 07/29/18 1426  . sodium chloride    . sodium chloride    . sodium chloride    . sodium chloride    .  ceFAZolin (ANCEF) IV     . heparin      . aspirin EC  81 mg Oral Daily  . Chlorhexidine Gluconate Cloth  6 each Topical Q0600  . darbepoetin (ARANESP) injection - DIALYSIS  100 mcg Intravenous Q Wed-HD  . heparin  5,000 Units Subcutaneous Q8H  . insulin aspart  0-9 Units Subcutaneous TID WC  . multivitamin with minerals  1  tablet Oral Daily  . pantoprazole  40 mg Oral BID  . sodium chloride flush  3 mL Intravenous Q12H    Dialysis Orders: MWF  3h 54min  400/AF1.5   59kg  2K/3.5Ca  P4  Hep 4000    - venofer 50/wk  - mircera 30 ug last 10/2  Home meds:  - amlodipine 5/ hydralazine 50 tid  - pantoprazole 40 bid/ MVI/ aspirin 81 mg  - hydrocodone- aceta prn/ methocarbamol 500 bid prn  Assessment/Plan: 1. MSSA bacteremia/ stitch infection: mild purulence at wound site resolved, stitches out. For TEE. ID following. F/U cx's pending.  2. ESRD HD MWF. HD today 3. Anemia of CKD - Hgb 9.0>7.9. ^ESA dose - 65mcg qwk on Wed.  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 Nutrition - Alb 2.7. Renal diet w/fluid restrictions once advanced. Nepro. Renavite. 6. HTN/Volume - BP's good close to dry 7. Hx cocaine/ETOH abuse 8. Hx Hep C  Kelly Splinter MD [facility] pager [phone]   07/30/2018, 12:10 PM

## 2018-07-30 NOTE — Progress Notes (Signed)
PT Cancellation Note  Patient Details Name: Roger Vazquez MRN: [id number] DOB: 1951-02-08   Cancelled Treatment:    Reason Eval/Treat Not Completed: Fatigue/lethargy limiting ability to participate; patient in HD all morning, this pm pt sleeping, barely arouesable to shake head no to assist up to EOB .  RN made aware.  Pt. Nodded head to try getting up tomorrow.  Limited verbalizations.  Will attempt another day.   Reginia Naas 07/30/2018, 4:57 PM  Roger Vazquez, [place] [phone] 07/30/2018

## 2018-07-30 NOTE — Progress Notes (Signed)
[facility] for [facility]ious [facility]  Date of Admission:  07/27/2018     Total days of antibiotics 4         ASSESSMENT/PLAN  Roger Vazquez continues to receive treatment for MSSA bacteremia of unclear source. Ultrasound of the fistula is pending and is scheduled for TEE tomorrow. He was febrile overnight, however clinically looking significantly better today. Tolerating the Cefazolin with no adverse side effects and is on Day 4 of therapy. Repeat blood cultures are without growth in <24 hours.  He expresses changes in vision and hearing today that would likely benefit from further evaluation by ophthalmology and otolaryngoly upon discharge as they do not appear emergent at present.   1. Continue current cefazolin. Duration of treatment pending TEE and ultrasound results.  2. Monitor cultures, fever, and WBC count.  3. Recommend ophthalmology and otolaryngoly evaluation for changes in hearing and vision.   Principal Problem:   Bacteremia due to methicillin susceptible Staphylococcus aureus (MSSA) Active Problems:   HEPATITIS C   Essential hypertension   Sepsis ([place])   ESRD (end stage renal disease) on dialysis ([place])   . aspirin EC  81 mg Oral Daily  . Chlorhexidine Gluconate Cloth  6 each Topical Q0600  . darbepoetin (ARANESP) injection - DIALYSIS  100 mcg Intravenous Q Wed-HD  . heparin      . heparin  5,000 Units Subcutaneous Q8H  . insulin aspart  0-9 Units Subcutaneous TID WC  . multivitamin with minerals  1 tablet Oral Daily  . pantoprazole  40 mg Oral BID  . sodium chloride flush  3 mL Intravenous Q12H    SUBJECTIVE:  Febrile overnight with temperature of 103 and no leukocytosis. Repeat cultures without growth in <24 hours. TEE scheduled for tomorrow. Remaining blood work consistent with ESRD.   Feeling better today. Having decrease vision out of the left eye that has been going on for about 1 month now. Also noted that his hearing has decreased on the left side.  Denies trauma or injury to either eye or ear. No discharge or evidence of infection. Describes about 1 month ago feeling like he had a blood vessel that may have popped in his left eye that he believes occurred when he had a bout of coughing.   No Known Allergies   Review of Systems: Review of Systems  Constitutional: Negative for chills and fever.  Eyes:       Positive for change in vision (not acute)  Respiratory: Negative for cough, shortness of breath and wheezing.   Cardiovascular: Negative for chest pain and leg swelling.  Skin: Negative for rash.      OBJECTIVE: Vitals:   07/30/18 1100 07/30/18 1130 07/30/18 1205 07/30/18 1358  BP: (!) 127/47 (!) 112/41 (!) 115/47 116/73  Pulse: 81 80 85 94  Resp:   16 18  Temp:   97.7 F (36.5 C) 99.6 F (37.6 C)  TempSrc:   Oral Oral  SpO2:   100% 99%  Weight:   58.4 kg    Body mass index is 20.78 kg/m.  Physical Exam  Constitutional: He is oriented to person, place, and time. He appears well-developed. No distress.  Cardiovascular: Normal rate, regular rhythm and intact distal pulses.  Murmur heard. Pulmonary/Chest: Effort normal and breath sounds normal.  Abdominal: Soft. Bowel sounds are normal. He exhibits no distension and no mass. There is no tenderness.  Neurological: He is alert and oriented to person, place, and time.  Skin: Skin is  warm and dry.  Psychiatric: He has a normal mood and affect.    Lab Results Lab Results  Component Value Date   WBC 7.0 07/30/2018   HGB 8.1 (L) 07/30/2018   HCT 25.1 (L) 07/30/2018   MCV 103.7 (H) 07/30/2018   PLT 82 (L) 07/30/2018    Lab Results  Component Value Date   CREATININE 8.64 (H) 07/30/2018   BUN 44 (H) 07/30/2018   NA 134 (L) 07/30/2018   K 3.0 (L) 07/30/2018   CL 101 07/30/2018   CO2 18 (L) 07/30/2018    Lab Results  Component Value Date   ALT 22 07/27/2018   AST 42 (H) 07/27/2018   ALKPHOS 66 07/27/2018   BILITOT 0.8 07/27/2018     Microbiology: Recent  Results (from the past 240 hour(s))  Culture, blood (Routine x 2)     Status: Abnormal   Collection Time: 07/27/18 11:04 AM  Result Value Ref Range Status   Specimen Description BLOOD BLOOD RIGHT FOREARM  Final   Special Requests   Final    BOTTLES DRAWN AEROBIC ONLY Blood Culture adequate volume   Culture  Setup Time   Final    AEROBIC BOTTLE ONLY GRAM POSITIVE COCCI CRITICAL RESULT CALLED TO, READ BACK BY AND VERIFIED WITH: J.LEDFORD,PHARMD AT 0500 ON 07/28/18 BY G.MCADOO Performed at [facility], [place] [address] [place], [place] [address]    Culture STAPHYLOCOCCUS AUREUS (A)  Final   Report Status 07/30/2018 FINAL  Final   Organism ID, Bacteria STAPHYLOCOCCUS AUREUS  Final      Susceptibility   Staphylococcus aureus - MIC*    CIPROFLOXACIN <=0.5 SENSITIVE Sensitive     ERYTHROMYCIN <=0.25 SENSITIVE Sensitive     GENTAMICIN <=0.5 SENSITIVE Sensitive     OXACILLIN <=0.25 SENSITIVE Sensitive     TETRACYCLINE <=1 SENSITIVE Sensitive     VANCOMYCIN <=0.5 SENSITIVE Sensitive     TRIMETH/SULFA <=10 SENSITIVE Sensitive     CLINDAMYCIN <=0.25 SENSITIVE Sensitive     RIFAMPIN <=0.5 SENSITIVE Sensitive     Inducible Clindamycin NEGATIVE Sensitive     * STAPHYLOCOCCUS AUREUS  Blood Culture ID Panel (Reflexed)     Status: Abnormal   Collection Time: 07/27/18 11:04 AM  Result Value Ref Range Status   Enterococcus species NOT DETECTED NOT DETECTED Final   Listeria monocytogenes NOT DETECTED NOT DETECTED Final   Staphylococcus species DETECTED (A) NOT DETECTED Final    Comment: CRITICAL RESULT CALLED TO, READ BACK BY AND VERIFIED WITH: J.LEDFORD,PHARMD AT 0500 ON 07/28/18 BY G.MCADOO    Staphylococcus aureus (BCID) DETECTED (A) NOT DETECTED Final    Comment: CRITICAL RESULT CALLED TO, READ BACK BY AND VERIFIED WITH: J.LEDFORD,PHARMD AT 0500 ON 07/28/18 BY G.MCADOO Methicillin (oxacillin) susceptible Staphylococcus aureus (MSSA). Preferred therapy is anti staphylococcal beta lactam  antibiotic (Cefazolin or Nafcillin), unless clinically contraindicated.    Methicillin resistance NOT DETECTED NOT DETECTED Final   Streptococcus species NOT DETECTED NOT DETECTED Final   Streptococcus agalactiae NOT DETECTED NOT DETECTED Final   Streptococcus pneumoniae NOT DETECTED NOT DETECTED Final   Streptococcus pyogenes NOT DETECTED NOT DETECTED Final   Acinetobacter baumannii NOT DETECTED NOT DETECTED Final   Enterobacteriaceae species NOT DETECTED NOT DETECTED Final   Enterobacter cloacae complex NOT DETECTED NOT DETECTED Final   Escherichia coli NOT DETECTED NOT DETECTED Final   Klebsiella oxytoca NOT DETECTED NOT DETECTED Final   Klebsiella pneumoniae NOT DETECTED NOT DETECTED Final   Proteus species NOT DETECTED NOT DETECTED Final  Serratia marcescens NOT DETECTED NOT DETECTED Final   Haemophilus influenzae NOT DETECTED NOT DETECTED Final   Neisseria meningitidis NOT DETECTED NOT DETECTED Final   Pseudomonas aeruginosa NOT DETECTED NOT DETECTED Final   Candida albicans NOT DETECTED NOT DETECTED Final   Candida glabrata NOT DETECTED NOT DETECTED Final   Candida krusei NOT DETECTED NOT DETECTED Final   Candida parapsilosis NOT DETECTED NOT DETECTED Final   Candida tropicalis NOT DETECTED NOT DETECTED Final    Comment: Performed at [facility], [place] [address] [place], [place] [address]  Culture, blood (Routine x 2)     Status: Abnormal   Collection Time: 07/27/18 11:11 AM  Result Value Ref Range Status   Specimen Description BLOOD BLOOD RIGHT ARM  Final   Special Requests   Final    BOTTLES DRAWN AEROBIC ONLY Blood Culture results may not be optimal due to an inadequate volume of blood received in culture bottles   Culture  Setup Time   Final    GRAM POSITIVE COCCI IN CLUSTERS AEROBIC BOTTLE ONLY CRITICAL VALUE NOTED.  VALUE IS CONSISTENT WITH PREVIOUSLY REPORTED AND CALLED VALUE.    Culture (A)  Final    STAPHYLOCOCCUS AUREUS SUSCEPTIBILITIES PERFORMED ON  PREVIOUS CULTURE WITHIN THE LAST 5 DAYS. Performed at [facility], [place] [address], [place], [place] [address]    Report Status 07/30/2018 FINAL  Final  C difficile quick scan w PCR reflex     Status: None   Collection Time: 07/27/18  9:28 PM  Result Value Ref Range Status   C Diff antigen NEGATIVE NEGATIVE Final   C Diff toxin NEGATIVE NEGATIVE Final   C Diff interpretation No C. difficile detected.  Final    Comment: Performed at [facility], [place] [address], [place], [place] [address]  MRSA PCR Screening     Status: None   Collection Time: 07/28/18  1:30 AM  Result Value Ref Range Status   MRSA by PCR NEGATIVE NEGATIVE Final    Comment:        The GeneXpert MRSA Assay (FDA approved for NASAL specimens only), is one component of a comprehensive MRSA colonization surveillance program. It is not intended to diagnose MRSA infection nor to guide or monitor treatment for MRSA infections. Performed at [facility], [place] [address] [place], [place] [address]   Culture, blood (routine x 2)     Status: None (Preliminary result)   Collection Time: 07/29/18  5:00 PM  Result Value Ref Range Status   Specimen Description BLOOD RIGHT HAND  Final   Special Requests   Final    BOTTLES DRAWN AEROBIC ONLY Blood Culture adequate volume   Culture   Final    NO GROWTH < 24 HOURS Performed at [facility], [place] [address] [place], [place] [address]    Report Status PENDING  Incomplete  Culture, blood (routine x 2)     Status: None (Preliminary result)   Collection Time: 07/29/18  5:07 PM  Result Value Ref Range Status   Specimen Description BLOOD RIGHT FOREARM  Final   Special Requests   Final    BOTTLES DRAWN AEROBIC ONLY Blood Culture adequate volume   Culture   Final    NO GROWTH < 24 HOURS Performed at [facility], [place] [address] [place], [place] [address]    Report Status PENDING  Incomplete     Terri Piedra, [place] [facility]  [facility]  [place] [phone] Pager  07/30/2018  4:17 PM

## 2018-07-30 NOTE — Progress Notes (Signed)
PROGRESS NOTE                                                                                                                                                                                                             Patient Demographics:    Roger Vazquez, is a 67 y.o. male, DOB - 03/09/1951, YDX:[id number]  Admit date - 07/27/2018   Admitting Physician Merton Border, MD  Outpatient Primary MD for the patient is [facility], [facility]  LOS - 3  Chief Complaint  Patient presents with  . Altered/Sepsis       Brief Narrative  Roger Vazquez  is a 67 y.o. male, with past medical history significant for end-stage renal disease on hemodialysis who was seen good last on Friday, sent to our emergency room for evaluation due to altered mental status and fever.  Patient was found covered with stool and had a high fever upon admission, with no obvious source, further work-up showed that he had MSSA bacteremia.   Subjective:   Patient in bed undergoing dialysis, appears comfortable, denies any headache, no fever, no chest pain or pressure, no shortness of breath , no abdominal pain. No focal weakness.    Assessment  & Plan :     1.  Sepsis with MSSA bacteremia.  Source unclear.  Does have a mild cough and C. difficile negative diarrhea, left arm dialysis graft site stable on exam.  Currently on Ancef, ID on board.  TTE stable. TEE and L. AV Graft [place] have been ordered and pending, so far negative repeat surveillance cultures.  Clinically sepsis physiology seems to have resolved will monitor, ID following, also seen by VVS and Renal.  2.  ESRD.  Dialysis schedule Monday, Wednesday and Friday. Renal on board.  3.  History of cocaine and alcohol abuse.  Says he has quit both for several years.  Will monitor.  4.  History of Hepatitis C.  Outpatient GI/ID follow-up as needed been no acute issues.  5.  Diarrhea.  C. difficile negative.  Resolved with PRN Imodium.  6.  Essential  hypertension.  Pressure currently soft, PRN hydralazine only and monitor.  7.  GERD.  On PPI.  8. Hyperglycemia -have undiagnosed DM type II, placed on sliding scale continue to monitor.  Diabetic education ordered on 07/29/2018.  Lab Results  Component Value Date   HGBA1C 8.2 (H) 07/28/2018   CBG (last 3)  Recent Labs    07/29/18 2001 07/29/18 2237 07/30/18 0709  GLUCAP  309* 77* 12*      Family Communication  :  neighbor   Code Status :  Full  Disposition Plan  :  TBD  Consults  :  ID, Renal  Procedures  :    TEE  L.ARM Graft [place]  TTE -   - Left ventricle: The cavity size was normal. Wall thickness was increased in a pattern of mild LVH. Systolic function was normal. The estimated ejection fraction was in the range of 60% to 65%. Wall motion was normal; there were no regional wall motion abnormalities. - Aortic valve: Transvalvular velocity was within the normal range. There was no stenosis. There was no regurgitation. - Mitral valve: Transvalvular velocity was within the normal range. There was no evidence for stenosis. There was no regurgitation. - Left atrium: The atrium was mildly dilated. - Right ventricle: The cavity size was normal. Wall thickness was normal. Systolic function was normal. - Atrial septum: No defect or patent foramen ovale was identified by color flow Doppler. - Tricuspid valve: There was trivial regurgitation. - Pulmonary arteries: Systolic pressure was within the normal range. PA peak pressure: 20 mm Hg (S).  DVT Prophylaxis  :   Heparin    Lab Results  Component Value Date   PLT 87 (L) 07/29/2018    Diet :  Diet Order            Diet NPO time specified  Diet effective midnight               Inpatient Medications Scheduled Meds: . aspirin EC  81 mg Oral Daily  . Chlorhexidine Gluconate Cloth  6 each Topical Q0600  . darbepoetin (ARANESP) injection - DIALYSIS  100 mcg Intravenous Q Wed-HD  . heparin  5,000 Units  Subcutaneous Q8H  . insulin aspart  0-9 Units Subcutaneous TID WC  . multivitamin with minerals  1 tablet Oral Daily  . pantoprazole  40 mg Oral BID  . sodium chloride flush  3 mL Intravenous Q12H   Continuous Infusions: . sodium chloride    . sodium chloride 75 mL/hr at 07/29/18 1426  . sodium chloride    . sodium chloride    .  ceFAZolin (ANCEF) IV     PRN Meds:.sodium chloride, sodium chloride, sodium chloride, acetaminophen, albuterol, alteplase, chlorproMAZINE, heparin, heparin, lidocaine (PF), lidocaine-prilocaine, loperamide, ondansetron **OR** ondansetron (ZOFRAN) IV, pentafluoroprop-tetrafluoroeth, sodium chloride flush  Antibiotics  :   Anti-infectives (From admission, onward)   Start     Dose/Rate Route Frequency Ordered Stop   07/30/18 1200  ceFAZolin (ANCEF) IVPB 2g/100 mL premix     2 g 200 mL/hr over 30 Minutes Intravenous Every M-W-F (Hemodialysis) 07/28/18 1329     07/29/18 1445  ceFAZolin (ANCEF) IVPB 2g/100 mL premix  Status:  Discontinued     2 g 200 mL/hr over 30 Minutes Intravenous STAT 07/29/18 1432 07/29/18 1532   07/29/18 1200  ceFAZolin (ANCEF) IVPB 2g/100 mL premix     2 g 200 mL/hr over 30 Minutes Intravenous  Once 07/28/18 1329 07/29/18 1456   07/28/18 1800  ceFEPIme (MAXIPIME) 2 g in sodium chloride 0.9 % 100 mL IVPB  Status:  Discontinued     2 g 200 mL/hr over 30 Minutes Intravenous Every M-W-F (Hemodialysis) 07/27/18 1239 07/28/18 0913   07/28/18 1200  vancomycin (VANCOCIN) IVPB 750 mg/150 ml premix  Status:  Discontinued     750 mg 150 mL/hr over 60 Minutes Intravenous Every M-W-F (Hemodialysis) 07/27/18 1239 07/28/18 3664  07/28/18 1200  ceFAZolin (ANCEF) IVPB 2g/100 mL premix  Status:  Discontinued     2 g 200 mL/hr over 30 Minutes Intravenous Every M-W-F (Hemodialysis) 07/28/18 0913 07/28/18 1329   07/27/18 1115  ceFEPIme (MAXIPIME) 2 g in sodium chloride 0.9 % 100 mL IVPB     2 g 200 mL/hr over 30 Minutes Intravenous  Once 07/27/18 1114  07/27/18 1257   07/27/18 1115  metroNIDAZOLE (FLAGYL) IVPB 500 mg  Status:  Discontinued     500 mg 100 mL/hr over 60 Minutes Intravenous Every 8 hours 07/27/18 1114 07/28/18 0913   07/27/18 1115  vancomycin (VANCOCIN) IVPB 1000 mg/200 mL premix     1,000 mg 200 mL/hr over 60 Minutes Intravenous  Once 07/27/18 1114 07/27/18 1427          Objective:   Vitals:   07/29/18 2327 07/30/18 0001 07/30/18 0114 07/30/18 0413  BP: (!) 91/44 (!) 96/43 (!) 101/49 (!) 98/51  Pulse:      Resp:  (!) 29 (!) 25 (!) 27  Temp: 97.9 F (36.6 C)   97.9 F (36.6 C)  TempSrc: Oral   Oral  SpO2:      Weight:        Wt Readings from Last 3 Encounters:  07/29/18 58.2 kg  09/13/14 62.6 kg  10/12/11 69.3 kg     Intake/Output Summary (Last 24 hours) at 07/30/2018 0820 Last data filed at 07/30/2018 0302 Gross per 24 hour  Intake 440.62 ml  Output 3000 ml  Net -2559.38 ml     Physical Exam  Awake Alert, Oriented X 3, No new F.N deficits, Normal affect [place].AT,PERRAL Supple Neck,No JVD, No cervical lymphadenopathy appriciated.  Symmetrical Chest wall movement, Good air movement bilaterally, CTAB RRR,No Gallops, Rubs or new Murmurs, No Parasternal Heave +ve B.Sounds, Abd Soft, No tenderness, No organomegaly appriciated, No rebound - guarding or rigidity. No Cyanosis, Clubbing or edema, L arm Graft under bandage - not warm, + ve bruit    Data Review:    CBC Recent Labs  Lab 07/27/18 1116 07/27/18 1449 07/28/18 0450 07/29/18 0757  WBC 8.7  --  7.1 6.2  HGB 10.7* 11.9* 9.0* 7.9*  HCT 35.2* 35.0* 28.0* 24.2*  PLT 106*  --  88* 87*  MCV 111.7*  --  105.3* 103.4*  MCH 34.0  --  33.8 33.8  MCHC 30.4  --  32.1 32.6  RDW 15.2  --  14.8 14.9  LYMPHSABS 0.5*  --   --   --   MONOABS 0.3  --   --   --   EOSABS 0.0  --   --   --   BASOSABS 0.0  --   --   --     Chemistries  Recent Labs  Lab 07/27/18 1357 07/27/18 1449 07/27/18 1529 07/28/18 0450 07/29/18 0830  NA 141 132* 131*  132* 133*  K UNABLE TO OBTAIN RESULT APPEARS EDTA CONTAMINANT 3.9 3.8 3.3* 3.3*  CL 125* 96* 96* 99 103  CO2 9*  --  18* 16* 14*  GLUCOSE 202* 331* 327* 334* 143*  BUN 52* 105* 101* 107* 110*  CREATININE 6.09* 14.00* 12.78* 13.66* 14.54*  CALCIUM  UNABLE TO OBTAIN RESULTS APPEARS EDTA CONTAMINANT  --  8.0* 7.4* 7.1*  AST 20  --  42*  --   --   ALT 11  --  22  --   --   ALKPHOS 30*  --  66  --   --  BILITOT 0.5  --  0.8  --   --    ------------------------------------------------------------------------------------------------------------------ No results for input(s): CHOL, HDL, LDLCALC, TRIG, CHOLHDL, LDLDIRECT in the last 72 hours.  Lab Results  Component Value Date   HGBA1C 8.2 (H) 07/28/2018   ------------------------------------------------------------------------------------------------------------------ No results for input(s): TSH, T4TOTAL, T3FREE, THYROIDAB in the last 72 hours.  Invalid input(s): FREET3 ------------------------------------------------------------------------------------------------------------------ No results for input(s): VITAMINB12, FOLATE, FERRITIN, TIBC, IRON, RETICCTPCT in the last 72 hours.  Coagulation profile Recent Labs  Lab 07/27/18 1116  INR 0.99    No results for input(s): DDIMER in the last 72 hours.  Cardiac Enzymes No results for input(s): CKMB, TROPONINI, MYOGLOBIN in the last 168 hours.  Invalid input(s): CK ------------------------------------------------------------------------------------------------------------------ No results found for: BNP  Micro Results Recent Results (from the past 240 hour(s))  Culture, blood (Routine x 2)     Status: Abnormal   Collection Time: 07/27/18 11:04 AM  Result Value Ref Range Status   Specimen Description BLOOD BLOOD RIGHT FOREARM  Final   Special Requests   Final    BOTTLES DRAWN AEROBIC ONLY Blood Culture adequate volume   Culture  Setup Time   Final    AEROBIC BOTTLE ONLY GRAM  POSITIVE COCCI CRITICAL RESULT CALLED TO, READ BACK BY AND VERIFIED WITH: J.LEDFORD,PHARMD AT 0500 ON 07/28/18 BY G.MCADOO Performed at [facility], [place] [address], [place], [place] [address]    Culture STAPHYLOCOCCUS AUREUS (A)  Final   Report Status 07/30/2018 FINAL  Final   Organism ID, Bacteria STAPHYLOCOCCUS AUREUS  Final      Susceptibility   Staphylococcus aureus - MIC*    CIPROFLOXACIN <=0.5 SENSITIVE Sensitive     ERYTHROMYCIN <=0.25 SENSITIVE Sensitive     GENTAMICIN <=0.5 SENSITIVE Sensitive     OXACILLIN <=0.25 SENSITIVE Sensitive     TETRACYCLINE <=1 SENSITIVE Sensitive     VANCOMYCIN <=0.5 SENSITIVE Sensitive     TRIMETH/SULFA <=10 SENSITIVE Sensitive     CLINDAMYCIN <=0.25 SENSITIVE Sensitive     RIFAMPIN <=0.5 SENSITIVE Sensitive     Inducible Clindamycin NEGATIVE Sensitive     * STAPHYLOCOCCUS AUREUS  Blood Culture ID Panel (Reflexed)     Status: Abnormal   Collection Time: 07/27/18 11:04 AM  Result Value Ref Range Status   Enterococcus species NOT DETECTED NOT DETECTED Final   Listeria monocytogenes NOT DETECTED NOT DETECTED Final   Staphylococcus species DETECTED (A) NOT DETECTED Final    Comment: CRITICAL RESULT CALLED TO, READ BACK BY AND VERIFIED WITH: J.LEDFORD,PHARMD AT 0500 ON 07/28/18 BY G.MCADOO    Staphylococcus aureus (BCID) DETECTED (A) NOT DETECTED Final    Comment: CRITICAL RESULT CALLED TO, READ BACK BY AND VERIFIED WITH: J.LEDFORD,PHARMD AT 0500 ON 07/28/18 BY G.MCADOO Methicillin (oxacillin) susceptible Staphylococcus aureus (MSSA). Preferred therapy is anti staphylococcal beta lactam antibiotic (Cefazolin or Nafcillin), unless clinically contraindicated.    Methicillin resistance NOT DETECTED NOT DETECTED Final   Streptococcus species NOT DETECTED NOT DETECTED Final   Streptococcus agalactiae NOT DETECTED NOT DETECTED Final   Streptococcus pneumoniae NOT DETECTED NOT DETECTED Final   Streptococcus pyogenes NOT DETECTED NOT DETECTED  Final   Acinetobacter baumannii NOT DETECTED NOT DETECTED Final   Enterobacteriaceae species NOT DETECTED NOT DETECTED Final   Enterobacter cloacae complex NOT DETECTED NOT DETECTED Final   Escherichia coli NOT DETECTED NOT DETECTED Final   Klebsiella oxytoca NOT DETECTED NOT DETECTED Final   Klebsiella pneumoniae NOT DETECTED NOT DETECTED Final   Proteus species NOT DETECTED NOT DETECTED  Final   Serratia marcescens NOT DETECTED NOT DETECTED Final   Haemophilus influenzae NOT DETECTED NOT DETECTED Final   Neisseria meningitidis NOT DETECTED NOT DETECTED Final   Pseudomonas aeruginosa NOT DETECTED NOT DETECTED Final   Candida albicans NOT DETECTED NOT DETECTED Final   Candida glabrata NOT DETECTED NOT DETECTED Final   Candida krusei NOT DETECTED NOT DETECTED Final   Candida parapsilosis NOT DETECTED NOT DETECTED Final   Candida tropicalis NOT DETECTED NOT DETECTED Final    Comment: Performed at [facility], [place] [address] [place], [place] [address]  Culture, blood (Routine x 2)     Status: Abnormal   Collection Time: 07/27/18 11:11 AM  Result Value Ref Range Status   Specimen Description BLOOD BLOOD RIGHT ARM  Final   Special Requests   Final    BOTTLES DRAWN AEROBIC ONLY Blood Culture results may not be optimal due to an inadequate volume of blood received in culture bottles   Culture  Setup Time   Final    GRAM POSITIVE COCCI IN CLUSTERS AEROBIC BOTTLE ONLY CRITICAL VALUE NOTED.  VALUE IS CONSISTENT WITH PREVIOUSLY REPORTED AND CALLED VALUE.    Culture (A)  Final    STAPHYLOCOCCUS AUREUS SUSCEPTIBILITIES PERFORMED ON PREVIOUS CULTURE WITHIN THE LAST 5 DAYS. Performed at [facility], [place] [address] [place], [place] [address]    Report Status 07/30/2018 FINAL  Final  C difficile quick scan w PCR reflex     Status: None   Collection Time: 07/27/18  9:28 PM  Result Value Ref Range Status   C Diff antigen NEGATIVE NEGATIVE Final   C Diff toxin NEGATIVE NEGATIVE  Final   C Diff interpretation No C. difficile detected.  Final    Comment: Performed at [facility], [place] [address], [place], [place] [address]  MRSA PCR Screening     Status: None   Collection Time: 07/28/18  1:30 AM  Result Value Ref Range Status   MRSA by PCR NEGATIVE NEGATIVE Final    Comment:        The GeneXpert MRSA Assay (FDA approved for NASAL specimens only), is one component of a comprehensive MRSA colonization surveillance program. It is not intended to diagnose MRSA infection nor to guide or monitor treatment for MRSA infections. Performed at [facility], [place] [address] [place], [place] [address]     Radiology Reports Dg Chest Portable 1 View  Result Date: 07/27/2018 CLINICAL DATA:  Sepsis. Fever and altered mental status. Hypertension and diabetes. Ex-smoker. EXAM: PORTABLE CHEST 1 VIEW COMPARISON:  02/14/2011 FINDINGS: Left axillary vascular stent. Numerous leads and wires project over the chest. Midline trachea. Normal heart size. Atherosclerosis in the transverse aorta. No pleural effusion or pneumothorax. Mild left hemidiaphragm elevation. Clear lungs. Interval but nonacute left rib deformities could be posttraumatic or postsurgical. IMPRESSION: No acute findings. Aortic Atherosclerosis (ICD10-I70.0). Electronically Signed   By: Abigail Miyamoto M.D.   On: 07/27/2018 12:10    Time Spent in minutes  30   Lala Lund M.D on 07/30/2018 at 8:20 AM  To page go to [email or web address] - password [facility]

## 2018-07-31 ENCOUNTER — Inpatient Hospital Stay (HOSPITAL_COMMUNITY): Payer: Medicare PPO | Admitting: Certified Registered"

## 2018-07-31 ENCOUNTER — Encounter (HOSPITAL_COMMUNITY): Payer: Self-pay

## 2018-07-31 ENCOUNTER — Inpatient Hospital Stay (HOSPITAL_COMMUNITY): Payer: Medicare PPO

## 2018-07-31 ENCOUNTER — Encounter (HOSPITAL_COMMUNITY)
Admission: EM | Disposition: A | Discharge status: Home Health | Payer: Self-pay | Source: Home / Self Care | Attending: Internal Medicine

## 2018-07-31 DIAGNOSIS — R7881 Bacteremia: Secondary | ICD-10-CM

## 2018-07-31 HISTORY — PX: TEE WITHOUT CARDIOVERSION: SHX5443

## 2018-07-31 LAB — BASIC METABOLIC PANEL
Anion gap: 16 — ABNORMAL HIGH (ref 5–15)
BUN: 29 mg/dL — AB (ref 8–23)
CALCIUM: 7.1 mg/dL — AB (ref 8.9–10.3)
CO2: 21 mmol/L — ABNORMAL LOW (ref 22–32)
CREATININE: 6.55 mg/dL — AB (ref 0.61–1.24)
Chloride: 94 mmol/L — ABNORMAL LOW (ref 98–111)
GFR, EST AFRICAN AMERICAN: 9 mL/min — AB (ref >60)
GFR, EST NON AFRICAN AMERICAN: 8 mL/min — AB (ref >60)
Glucose, Bld: 211 mg/dL — ABNORMAL HIGH (ref 70–99)
Potassium: 3.3 mmol/L — ABNORMAL LOW (ref 3.5–5.1)
SODIUM: 131 mmol/L — AB (ref 135–145)

## 2018-07-31 LAB — GLUCOSE, CAPILLARY
GLUCOSE-CAPILLARY: 202 mg/dL — AB (ref 70–99)
GLUCOSE-CAPILLARY: 299 mg/dL — AB (ref 70–99)
Glucose-Capillary: 193 mg/dL — ABNORMAL HIGH (ref 70–99)
Glucose-Capillary: 285 mg/dL — ABNORMAL HIGH (ref 70–99)

## 2018-07-31 LAB — PROTIME-INR
INR: 1.08
PROTHROMBIN TIME: 13.9 s (ref 11.4–15.2)

## 2018-07-31 SURGERY — ECHOCARDIOGRAM, TRANSESOPHAGEAL
Anesthesia: General

## 2018-07-31 MED ORDER — PROPOFOL 10 MG/ML IV BOLUS
INTRAVENOUS | Status: DC | PRN
  Administered 2018-07-31 (×2): 20 mg via INTRAVENOUS

## 2018-07-31 MED ORDER — PROPOFOL 500 MG/50ML IV EMUL
INTRAVENOUS | Status: DC | PRN
  Administered 2018-07-31: 50 ug/kg/min via INTRAVENOUS

## 2018-07-31 MED ORDER — SODIUM CHLORIDE 0.9 % IV SOLN
INTRAVENOUS | Status: DC
  Administered 2018-07-31: 500 mL via INTRAVENOUS

## 2018-07-31 MED ORDER — LIDOCAINE HCL (CARDIAC) PF 100 MG/5ML IV SOSY
PREFILLED_SYRINGE | INTRAVENOUS | Status: DC | PRN
  Administered 2018-07-31: 30 mg via INTRATRACHEAL

## 2018-07-31 MED ORDER — PHENYLEPHRINE HCL 10 MG/ML IJ SOLN
INTRAMUSCULAR | Status: DC | PRN
  Administered 2018-07-31 (×2): 80 ug via INTRAVENOUS

## 2018-07-31 MED ORDER — BUTAMBEN-TETRACAINE-BENZOCAINE 2-2-14 % EX AERO
INHALATION_SPRAY | CUTANEOUS | Status: DC | PRN
  Administered 2018-07-31: 1 via TOPICAL

## 2018-07-31 MED ORDER — SODIUM CHLORIDE 0.9 % IV SOLN: INTRAVENOUS | Status: DC

## 2018-07-31 MED ORDER — CHLORHEXIDINE GLUCONATE CLOTH 2 % EX PADS
6.0000 | MEDICATED_PAD | Freq: Every day | CUTANEOUS | Status: DC
  Administered 2018-08-02: 6 via TOPICAL

## 2018-07-31 NOTE — Progress Notes (Signed)
PHARMACY CONSULT NOTE FOR:  OUTPATIENT  PARENTERAL ANTIBIOTIC THERAPY (OPAT)  Indication: MSSA bacteremia Regimen: Cefazolin 2 gm w/ HD MWF End date: August 14, 2018  IV antibiotic discharge orders are pended. To discharging provider:  please sign these orders via discharge navigator,  Select New Orders & click on the button choice - Manage This Unsigned Work.      Thank you for allowing [place] to participate in this patients care.   Jens Som, PharmD Please utilize Amion (under [place]) for appropriate number for your unit pharmacist. 07/31/2018 5:27 PM

## 2018-07-31 NOTE — Anesthesia Preprocedure Evaluation (Addendum)
Anesthesia Evaluation  Patient identified by MRN, date of birth, ID band Patient awake    Reviewed: Allergy & Precautions, NPO status , Patient's Chart, lab work & pertinent test results  Airway Mallampati: II  TM Distance: >3 FB     Dental  (+) Edentulous Lower, Edentulous Upper, Dental Advisory Given   Pulmonary former smoker,    breath sounds clear to auscultation       Cardiovascular hypertension,  Rhythm:Regular Rate:Normal     Neuro/Psych    GI/Hepatic   Endo/Other  diabetes  Renal/GU Renal disease     Musculoskeletal   Abdominal   Peds  Hematology   Anesthesia Other Findings   Reproductive/Obstetrics                            Anesthesia Physical Anesthesia Plan  ASA: III  Anesthesia Plan: General   Post-op Pain Management:    Induction: Intravenous  PONV Risk Score and Plan: Treatment may vary due to age or medical condition  Airway Management Planned: Mask and Simple Face Mask  Additional Equipment:   Intra-op Plan:   Post-operative Plan:   Informed Consent: I have reviewed the patients History and Physical, chart, labs and discussed the procedure including the risks, benefits and alternatives for the proposed anesthesia with the patient or authorized representative who has indicated his/her understanding and acceptance.   Dental advisory given  Plan Discussed with: CRNA and Anesthesiologist  Anesthesia Plan Comments:         Anesthesia Quick Evaluation

## 2018-07-31 NOTE — Progress Notes (Signed)
PROGRESS NOTE                                                                                                                                                                                                             Patient Demographics:    Roger Vazquez, is a 67 y.o. male, DOB - 05/26/1951, RCV:[id number]  Admit date - 07/27/2018   Admitting Physician Merton Border, MD  Outpatient Primary MD for the patient is [facility], [facility]  LOS - 4  Chief Complaint  Patient presents with  . Altered/Sepsis       Brief Narrative  Roger Vazquez  is a 67 y.o. male, with past medical history significant for end-stage renal disease on hemodialysis who was seen good last on Friday, sent to our emergency room for evaluation due to altered mental status and fever.  Patient was found covered with stool and had a high fever upon admission, with no obvious source, further work-up showed that he had MSSA bacteremia.   Subjective:   Patient in bed, appears comfortable, denies any headache, no fever, no chest pain or pressure, no shortness of breath , no abdominal pain. No focal weakness.    Assessment  & Plan :     1.  Sepsis with MSSA bacteremia.  Source unclear.  Does have a mild cough and C. difficile negative diarrhea, left arm dialysis graft site stable on exam.  Currently on Ancef, ID on board.  Stable TTE and TEE and L. AV Graft [place] have been ordered and pending, so far negative repeat surveillance cultures which were drawn on 07/29/2018.  Clinically sepsis physiology seems to have resolved will monitor, ID following, also seen by VVS and Renal.  If remains stable likely discharge tomorrow after dialysis with total of 2 weeks of IV cefazolin unless changed by ID.  2.  ESRD.  Dialysis schedule Monday, Wednesday and Friday. Renal on board.  3.  History of cocaine and alcohol abuse.  Says he has quit both for several years.  Will monitor.  4.  History of Hepatitis C.  Outpatient GI/ID  follow-up as needed been no acute issues.  5.  Diarrhea.  C. difficile negative.  Resolved with PRN Imodium.  6.  Essential hypertension.  Pressure currently soft, PRN hydralazine only and monitor.  7.  GERD.  On PPI.  8.  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  These have been present for at least one month and are not worsening, outpatient  ENT ophthalmology follow-up.    9.  Did have DM type II previously and became noncompliant with his medications for unclear reasons.  Diabetic education provided.  We will go home on insulin.  Lab Results  Component Value Date   HGBA1C 8.2 (H) 07/28/2018   CBG (last 3)  Recent Labs    07/30/18 1653 07/30/18 2213 07/31/18 0812  GLUCAP 300* 201* 68*      Family Communication  :  neighbor   Code Status :  Full  Disposition Plan  : Home in the next 1 to 2 days  Consults  :  ID, Renal  Procedures  :    TEE   1. No evidence of endocarditis. 2. No LAA thrombus 3. Negative for PFO by color doppler 4. Moderate LVH 5. LVEF 60-65% 6.   L.ARM Graft [place]   TTE -   - Left ventricle: The cavity size was normal. Wall thickness was increased in a pattern of mild LVH. Systolic function was normal. The estimated ejection fraction was in the range of 60% to 65%. Wall motion was normal; there were no regional wall motion abnormalities. - Aortic valve: Transvalvular velocity was within the normal range. There was no stenosis. There was no regurgitation. - Mitral valve: Transvalvular velocity was within the normal range. There was no evidence for stenosis. There was no regurgitation. - Left atrium: The atrium was mildly dilated. - Right ventricle: The cavity size was normal. Wall thickness was normal. Systolic function was normal. - Atrial septum: No defect or patent foramen ovale was identified by color flow Doppler. - Tricuspid valve: There was trivial regurgitation. - Pulmonary arteries: Systolic pressure was  within the normal range. PA peak pressure: 20 mm Hg (S).  DVT Prophylaxis  :   Heparin    Lab Results  Component Value Date   PLT 82 (L) 07/30/2018    Diet :  Diet Order    None       Inpatient Medications Scheduled Meds: . aspirin EC  81 mg Oral Daily  . Chlorhexidine Gluconate Cloth  6 each Topical Q0600  . darbepoetin (ARANESP) injection - DIALYSIS  100 mcg Intravenous Q Wed-HD  . heparin  5,000 Units Subcutaneous Q8H  . insulin aspart  0-9 Units Subcutaneous TID WC  . multivitamin with minerals  1 tablet Oral Daily  . pantoprazole  40 mg Oral BID  . sodium chloride flush  3 mL Intravenous Q12H   Continuous Infusions: . sodium chloride    .  ceFAZolin (ANCEF) IV 2 g (07/30/18 1440)   PRN Meds:.sodium chloride, acetaminophen, albuterol, chlorproMAZINE, loperamide, ondansetron **OR** ondansetron (ZOFRAN) IV, sodium chloride flush  Antibiotics  :   Anti-infectives (From admission, onward)   Start     Dose/Rate Route Frequency Ordered Stop   07/30/18 1200  ceFAZolin (ANCEF) IVPB 2g/100 mL premix     2 g 200 mL/hr over 30 Minutes Intravenous Every M-W-F (Hemodialysis) 07/28/18 1329     07/29/18 1445  ceFAZolin (ANCEF) IVPB 2g/100 mL premix  Status:  Discontinued     2 g 200 mL/hr over 30 Minutes Intravenous STAT 07/29/18 1432 07/29/18 1532   07/29/18 1200  ceFAZolin (ANCEF) IVPB 2g/100 mL premix     2 g 200 mL/hr over 30 Minutes Intravenous  Once 07/28/18 1329 07/29/18 1456   07/28/18 1800  ceFEPIme (MAXIPIME) 2 g in sodium chloride 0.9 % 100 mL IVPB  Status:  Discontinued     2 g 200 mL/hr over 30  Minutes Intravenous Every M-W-F (Hemodialysis) 07/27/18 1239 07/28/18 0913   07/28/18 1200  vancomycin (VANCOCIN) IVPB 750 mg/150 ml premix  Status:  Discontinued     750 mg 150 mL/hr over 60 Minutes Intravenous Every M-W-F (Hemodialysis) 07/27/18 1239 07/28/18 0913   07/28/18 1200  ceFAZolin (ANCEF) IVPB 2g/100 mL premix  Status:  Discontinued     2 g 200 mL/hr over 30  Minutes Intravenous Every M-W-F (Hemodialysis) 07/28/18 0913 07/28/18 1329   07/27/18 1115  ceFEPIme (MAXIPIME) 2 g in sodium chloride 0.9 % 100 mL IVPB     2 g 200 mL/hr over 30 Minutes Intravenous  Once 07/27/18 1114 07/27/18 1257   07/27/18 1115  metroNIDAZOLE (FLAGYL) IVPB 500 mg  Status:  Discontinued     500 mg 100 mL/hr over 60 Minutes Intravenous Every 8 hours 07/27/18 1114 07/28/18 0913   07/27/18 1115  vancomycin (VANCOCIN) IVPB 1000 mg/200 mL premix     1,000 mg 200 mL/hr over 60 Minutes Intravenous  Once 07/27/18 1114 07/27/18 1427          Objective:   Vitals:   07/31/18 0803 07/31/18 0920 07/31/18 0926 07/31/18 0927  BP: (!) 100/33 (!) 80/46 (!) 90/37 (!) 90/37  Pulse: 81 80 80 79  Resp: 20 13 (!) 22 (!) 26  Temp: 100.1 F (37.8 C) 98.8 F (37.1 C)    TempSrc: Oral Oral    SpO2: 98% 99% 99% 99%  Weight:      Height: 5\' 6"  (1.676 m)       Wt Readings from Last 3 Encounters:  07/30/18 58.4 kg  09/13/14 62.6 kg  10/12/11 69.3 kg     Intake/Output Summary (Last 24 hours) at 07/31/2018 1017 Last data filed at 07/30/2018 1205 Gross per 24 hour  Intake -  Output 1500 ml  Net -1500 ml     Physical Exam  Awake Alert, Oriented X 3, No new F.N deficits, Normal affect [place].AT,PERRAL Supple Neck,No JVD, No cervical lymphadenopathy appriciated.  Symmetrical Chest wall movement, Good air movement bilaterally, CTAB RRR,No Gallops, Rubs or new Murmurs, No Parasternal Heave +ve B.Sounds, Abd Soft, No tenderness, No organomegaly appriciated, No rebound - guarding or rigidity. No Cyanosis, Clubbing or edema, L arm Graft under bandage - not warm, + ve bruit    Data Review:    CBC Recent Labs  Lab 07/27/18 1116 07/27/18 1449 07/28/18 0450 07/29/18 0757 07/30/18 0832  WBC 8.7  --  7.1 6.2 7.0  HGB 10.7* 11.9* 9.0* 7.9* 8.1*  HCT 35.2* 35.0* 28.0* 24.2* 25.1*  PLT 106*  --  88* 87* 82*  MCV 111.7*  --  105.3* 103.4* 103.7*  MCH 34.0  --  33.8 33.8 33.5    MCHC 30.4  --  32.1 32.6 32.3  RDW 15.2  --  14.8 14.9 14.7  LYMPHSABS 0.5*  --   --   --   --   MONOABS 0.3  --   --   --   --   EOSABS 0.0  --   --   --   --   BASOSABS 0.0  --   --   --   --     Chemistries  Recent Labs  Lab 07/27/18 1357  07/27/18 1529 07/28/18 0450 07/29/18 0830 07/30/18 0833 07/31/18 0656  NA 141   < > 131* 132* 133* 134* 131*  K UNABLE TO OBTAIN RESULT APPEARS EDTA CONTAMINANT   < > 3.8 3.3* 3.3* 3.0* 3.3*  CL  125*   < > 96* 99 103 101 94*  CO2 9*  --  18* 16* 14* 18* 21*  GLUCOSE 202*   < > 327* 334* 143* 198* 211*  BUN 52*   < > 101* 107* 110* 44* 29*  CREATININE 6.09*   < > 12.78* 13.66* 14.54* 8.64* 6.55*  CALCIUM  UNABLE TO OBTAIN RESULTS APPEARS EDTA CONTAMINANT  --  8.0* 7.4* 7.1* 7.0* 7.1*  AST 20  --  42*  --   --   --   --   ALT 11  --  22  --   --   --   --   ALKPHOS 30*  --  66  --   --   --   --   BILITOT 0.5  --  0.8  --   --   --   --    < > = values in this interval not displayed.   ------------------------------------------------------------------------------------------------------------------ No results for input(s): CHOL, HDL, LDLCALC, TRIG, CHOLHDL, LDLDIRECT in the last 72 hours.  Lab Results  Component Value Date   HGBA1C 8.2 (H) 07/28/2018   ------------------------------------------------------------------------------------------------------------------ No results for input(s): TSH, T4TOTAL, T3FREE, THYROIDAB in the last 72 hours.  Invalid input(s): FREET3 ------------------------------------------------------------------------------------------------------------------ No results for input(s): VITAMINB12, FOLATE, FERRITIN, TIBC, IRON, RETICCTPCT in the last 72 hours.  Coagulation profile Recent Labs  Lab 07/27/18 1116 07/31/18 0656  INR 0.99 1.08    No results for input(s): DDIMER in the last 72 hours.  Cardiac Enzymes No results for input(s): CKMB, TROPONINI, MYOGLOBIN in the last 168 hours.  Invalid  input(s): CK ------------------------------------------------------------------------------------------------------------------ No results found for: BNP  Micro Results Recent Results (from the past 240 hour(s))  Culture, blood (Routine x 2)     Status: Abnormal   Collection Time: 07/27/18 11:04 AM  Result Value Ref Range Status   Specimen Description BLOOD BLOOD RIGHT FOREARM  Final   Special Requests   Final    BOTTLES DRAWN AEROBIC ONLY Blood Culture adequate volume   Culture  Setup Time   Final    AEROBIC BOTTLE ONLY GRAM POSITIVE COCCI CRITICAL RESULT CALLED TO, READ BACK BY AND VERIFIED WITH: J.LEDFORD,PHARMD AT 0500 ON 07/28/18 BY G.MCADOO Performed at [facility], [place] [address], [place], [place] [address]    Culture STAPHYLOCOCCUS AUREUS (A)  Final   Report Status 07/30/2018 FINAL  Final   Organism ID, Bacteria STAPHYLOCOCCUS AUREUS  Final      Susceptibility   Staphylococcus aureus - MIC*    CIPROFLOXACIN <=0.5 SENSITIVE Sensitive     ERYTHROMYCIN <=0.25 SENSITIVE Sensitive     GENTAMICIN <=0.5 SENSITIVE Sensitive     OXACILLIN <=0.25 SENSITIVE Sensitive     TETRACYCLINE <=1 SENSITIVE Sensitive     VANCOMYCIN <=0.5 SENSITIVE Sensitive     TRIMETH/SULFA <=10 SENSITIVE Sensitive     CLINDAMYCIN <=0.25 SENSITIVE Sensitive     RIFAMPIN <=0.5 SENSITIVE Sensitive     Inducible Clindamycin NEGATIVE Sensitive     * STAPHYLOCOCCUS AUREUS  Blood Culture ID Panel (Reflexed)     Status: Abnormal   Collection Time: 07/27/18 11:04 AM  Result Value Ref Range Status   Enterococcus species NOT DETECTED NOT DETECTED Final   Listeria monocytogenes NOT DETECTED NOT DETECTED Final   Staphylococcus species DETECTED (A) NOT DETECTED Final    Comment: CRITICAL RESULT CALLED TO, READ BACK BY AND VERIFIED WITH: J.LEDFORD,PHARMD AT 0500 ON 07/28/18 BY G.MCADOO    Staphylococcus aureus (BCID) DETECTED (A) NOT  DETECTED Final    Comment: CRITICAL RESULT CALLED TO, READ BACK BY AND  VERIFIED WITH: J.LEDFORD,PHARMD AT 0500 ON 07/28/18 BY G.MCADOO Methicillin (oxacillin) susceptible Staphylococcus aureus (MSSA). Preferred therapy is anti staphylococcal beta lactam antibiotic (Cefazolin or Nafcillin), unless clinically contraindicated.    Methicillin resistance NOT DETECTED NOT DETECTED Final   Streptococcus species NOT DETECTED NOT DETECTED Final   Streptococcus agalactiae NOT DETECTED NOT DETECTED Final   Streptococcus pneumoniae NOT DETECTED NOT DETECTED Final   Streptococcus pyogenes NOT DETECTED NOT DETECTED Final   Acinetobacter baumannii NOT DETECTED NOT DETECTED Final   Enterobacteriaceae species NOT DETECTED NOT DETECTED Final   Enterobacter cloacae complex NOT DETECTED NOT DETECTED Final   Escherichia coli NOT DETECTED NOT DETECTED Final   Klebsiella oxytoca NOT DETECTED NOT DETECTED Final   Klebsiella pneumoniae NOT DETECTED NOT DETECTED Final   Proteus species NOT DETECTED NOT DETECTED Final   Serratia marcescens NOT DETECTED NOT DETECTED Final   Haemophilus influenzae NOT DETECTED NOT DETECTED Final   Neisseria meningitidis NOT DETECTED NOT DETECTED Final   Pseudomonas aeruginosa NOT DETECTED NOT DETECTED Final   Candida albicans NOT DETECTED NOT DETECTED Final   Candida glabrata NOT DETECTED NOT DETECTED Final   Candida krusei NOT DETECTED NOT DETECTED Final   Candida parapsilosis NOT DETECTED NOT DETECTED Final   Candida tropicalis NOT DETECTED NOT DETECTED Final    Comment: Performed at [facility], [place] [address], [place], [place] [address]  Culture, blood (Routine x 2)     Status: Abnormal   Collection Time: 07/27/18 11:11 AM  Result Value Ref Range Status   Specimen Description BLOOD BLOOD RIGHT ARM  Final   Special Requests   Final    BOTTLES DRAWN AEROBIC ONLY Blood Culture results may not be optimal due to an inadequate volume of blood received in culture bottles   Culture  Setup Time   Final    GRAM POSITIVE COCCI IN CLUSTERS AEROBIC  BOTTLE ONLY CRITICAL VALUE NOTED.  VALUE IS CONSISTENT WITH PREVIOUSLY REPORTED AND CALLED VALUE.    Culture (A)  Final    STAPHYLOCOCCUS AUREUS SUSCEPTIBILITIES PERFORMED ON PREVIOUS CULTURE WITHIN THE LAST 5 DAYS. Performed at [facility], [place] [address], [place], [place] [address]    Report Status 07/30/2018 FINAL  Final  C difficile quick scan w PCR reflex     Status: None   Collection Time: 07/27/18  9:28 PM  Result Value Ref Range Status   C Diff antigen NEGATIVE NEGATIVE Final   C Diff toxin NEGATIVE NEGATIVE Final   C Diff interpretation No C. difficile detected.  Final    Comment: Performed at [facility], [place] [address], [place], [place] [address]  MRSA PCR Screening     Status: None   Collection Time: 07/28/18  1:30 AM  Result Value Ref Range Status   MRSA by PCR NEGATIVE NEGATIVE Final    Comment:        The GeneXpert MRSA Assay (FDA approved for NASAL specimens only), is one component of a comprehensive MRSA colonization surveillance program. It is not intended to diagnose MRSA infection nor to guide or monitor treatment for MRSA infections. Performed at [facility], [place] [address] [place], [place] [address]   Culture, blood (routine x 2)     Status: None (Preliminary result)   Collection Time: 07/29/18  5:00 PM  Result Value Ref Range Status   Specimen Description BLOOD RIGHT HAND  Final  Special Requests   Final    BOTTLES DRAWN AEROBIC ONLY Blood Culture adequate volume   Culture   Final    NO GROWTH 2 DAYS Performed at [facility], [place] [address], [place], [place] [address]    Report Status PENDING  Incomplete  Culture, blood (routine x 2)     Status: None (Preliminary result)   Collection Time: 07/29/18  5:07 PM  Result Value Ref Range Status   Specimen Description BLOOD RIGHT FOREARM  Final   Special Requests   Final    BOTTLES DRAWN AEROBIC ONLY Blood Culture adequate volume   Culture   Final    NO GROWTH 2  DAYS Performed at [facility], [address] [place], [place] [address]    Report Status PENDING  Incomplete    Radiology Reports Dg Chest Portable 1 View  Result Date: 07/27/2018 CLINICAL DATA:  Sepsis. Fever and altered mental status. Hypertension and diabetes. Ex-smoker. EXAM: PORTABLE CHEST 1 VIEW COMPARISON:  02/14/2011 FINDINGS: Left axillary vascular stent. Numerous leads and wires project over the chest. Midline trachea. Normal heart size. Atherosclerosis in the transverse aorta. No pleural effusion or pneumothorax. Mild left hemidiaphragm elevation. Clear lungs. Interval but nonacute left rib deformities could be posttraumatic or postsurgical. IMPRESSION: No acute findings. Aortic Atherosclerosis (ICD10-I70.0). Electronically Signed   By: Abigail Miyamoto M.D.   On: 07/27/2018 12:10    Time Spent in minutes  30   Lala Lund M.D on 07/31/2018 at 10:17 AM  To page go to [email or web address] - password [facility]

## 2018-07-31 NOTE — Anesthesia Postprocedure Evaluation (Signed)
Anesthesia Post Note  Patient: Roger Vazquez  Procedure(s) Performed: TRANSESOPHAGEAL ECHOCARDIOGRAM (TEE) (N/A )     Patient location during evaluation: Endoscopy Anesthesia Type: General Level of consciousness: awake Pain management: pain level controlled Vital Signs Assessment: post-procedure vital signs reviewed and stable Respiratory status: spontaneous breathing Cardiovascular status: stable Postop Assessment: no apparent nausea or vomiting Anesthetic complications: no    Last Vitals:  Vitals:   07/31/18 0926 07/31/18 0927  BP: (!) 90/37 (!) 90/37  Pulse: 80 79  Resp: (!) 22 (!) 26  Temp:    SpO2: 99% 99%    Last Pain:  Vitals:   07/31/18 0927  TempSrc:   PainSc: 0-No pain                 Brnadon Eoff

## 2018-07-31 NOTE — Progress Notes (Signed)
PT Cancellation Note  Patient Details Name: Roger Vazquez MRN: [id number] DOB: Aug 27, 1951   Cancelled Treatment:    Reason Eval/Treat Not Completed: Patient at procedure or test/unavailable; pt currently in endo, barely arousable yesterday, will continue efforts to see pt for PT eval    [facility] 07/31/2018, 8:32 AM

## 2018-07-31 NOTE — Progress Notes (Signed)
Inpatient Diabetes Program Recommendations  AACE/ADA: New Consensus Statement on Inpatient Glycemic Control (2015)  Target Ranges:  Prepandial:   less than 140 mg/dL      Peak postprandial:   less than 180 mg/dL (1-2 hours)      Critically ill patients:  140 - 180 mg/dL   Lab Results  Component Value Date   GLUCAP 193 (H) 07/31/2018   HGBA1C 8.2 (H) 07/28/2018    Review of Glycemic Control Results for Roger Vazquez, Roger Vazquez (MRN [id number]) as of 07/31/2018 10:10  Ref. Range 07/30/2018 13:53 07/30/2018 16:53 07/30/2018 22:13 07/31/2018 08:12  Glucose-Capillary Latest Ref Range: 70 - 99 mg/dL 135 (H) 300 (H) 201 (H) 193 (H)  Results for Roger Vazquez, Roger Vazquez (MRN [id number]) as of 07/31/2018 10:10  Ref. Range 07/29/2018 17:09 07/29/2018 20:01 07/29/2018 22:37  Glucose-Capillary Latest Ref Range: 70 - 99 mg/dL 350 (H) 309 (H) 211 (H)   Diabetes history: Type 2 DM Outpatient Diabetes medications: none Current orders for Inpatient glycemic control: Novolog 0-9 units TID  Inpatient Diabetes Program Recommendations:    If post prandials continue to exceed 180 mg/dL, consider adding Novolog 3 units TID (assuming patient is eating >50% of meals).   Will plan to see patient today for additional education.   Thanks, Bronson Curb, MSN, RNC-OB Diabetes Coordinator [phone] (8a-5p)

## 2018-07-31 NOTE — Progress Notes (Addendum)
[facility] Progress Note   Subjective:   Patient seen in room post procedure. No new complaints.    Objective Vitals:   07/31/18 0803 07/31/18 0920 07/31/18 0926 07/31/18 0927  BP: (!) 100/33 (!) 80/46 (!) 90/37 (!) 90/37  Pulse: 81 80 80 79  Resp: 20 13 (!) 22 (!) 26  Temp: 100.1 F (37.8 C) 98.8 F (37.1 C)    TempSrc: Oral Oral    SpO2: 98% 99% 99% 99%  Weight:      Height: 5\' 6"  (1.676 m)      Physical Exam General:NAD, chronically ill appearing male Heart:RRR, no mrg Lungs:CTAB Abdomen:soft, NTND Extremities:no LE edema Dialysis Access: LU AVG +bruit   Filed Weights   07/29/18 1230 07/30/18 0700 07/30/18 1205  Weight: 58.2 kg 60.1 kg 58.4 kg   No intake or output data in the 24 hours ending 07/31/18 1337  Additional Objective Labs: Basic Metabolic Panel: Recent Labs  Lab 07/29/18 0830 07/30/18 0833 07/31/18 0656  NA 133* 134* 131*  K 3.3* 3.0* 3.3*  CL 103 101 94*  CO2 14* 18* 21*  GLUCOSE 143* 198* 211*  BUN 110* 44* 29*  CREATININE 14.54* 8.64* 6.55*  CALCIUM 7.1* 7.0* 7.1*  PHOS 4.9* 4.0  --    Liver Function Tests: Recent Labs  Lab 07/27/18 1357 07/27/18 1529 07/29/18 0830 07/30/18 0833  AST 20 42*  --   --   ALT 11 22  --   --   ALKPHOS 30* 66  --   --   BILITOT 0.5 0.8  --   --   PROT 3.3* 7.2  --   --   ALBUMIN 1.5* 3.4* 2.7* 2.6*   CBC: Recent Labs  Lab 07/27/18 1116  07/28/18 0450 07/29/18 0757 07/30/18 0832  WBC 8.7  --  7.1 6.2 7.0  NEUTROABS 7.8*  --   --   --   --   HGB 10.7*   < > 9.0* 7.9* 8.1*  HCT 35.2*   < > 28.0* 24.2* 25.1*  MCV 111.7*  --  105.3* 103.4* 103.7*  PLT 106*  --  88* 87* 82*   < > = values in this interval not displayed.   CBG: Recent Labs  Lab 07/30/18 1353 07/30/18 1653 07/30/18 2213 07/31/18 0812 07/31/18 1211  GLUCAP 135* 300* 201* 193* 202*    Lab Results  Component Value Date   INR 1.08 07/31/2018   INR 0.99 07/27/2018   INR 0.97 10/07/2011    Studies/Results: No results found.  Medications: . sodium chloride    .  ceFAZolin (ANCEF) IV 2 g (07/30/18 1440)   . aspirin EC  81 mg Oral Daily  . Chlorhexidine Gluconate Cloth  6 each Topical Q0600  . darbepoetin (ARANESP) injection - DIALYSIS  100 mcg Intravenous Q Wed-HD  . heparin  5,000 Units Subcutaneous Q8H  . insulin aspart  0-9 Units Subcutaneous TID WC  . multivitamin with minerals  1 tablet Oral Daily  . pantoprazole  40 mg Oral BID  . sodium chloride flush  3 mL Intravenous Q12H    Dialysis Orders: MWF 3h 51min 400/AF1.5 59kg 2K/3.5Ca P4 Hep 4000  - venofer 50/wk - mircera 30 ug last 10/2 -calcitriol 0.72mcg qHD  Home meds: - amlodipine 5/ hydralazine 50 tid - pantoprazole 40 bid/ MVI/ aspirin 81 mg - hydrocodone- aceta prn/ methocarbamol 500 bid prn  Assessment/Plan: 1. MSSA bacteremia/ stitch infection: mild purulence at wound site resolved, stitches out. ID following.  F/U cx's neg. TEE showed no evidence endocarditis, EF 60-65%.  2. ESRD HD MWF. K 3.3. Orders written for HD tomorrow per regular schedule.  3. Anemia of CKD - Hgb 9.0>7.9>8.1. Kirby Funk dose - 76mcg qwk on Wed.  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  5. Nutrition - Alb 2.6. Renal diet w/fluid restrictions once advanced. Nepro. Renavite. 6. HTN/Volume - BP's soft.  A little under EDW.  Titrate down as tolerated.  7. Hx cocaine/ETOH abuse 8. Hx Hep C 9. Dispo - ok to dc from renal standpoint  Jen Mow, PA-C [facility] Pager: [phone] 07/31/2018,1:37 PM  LOS: 4 days   Pt seen, examined and agree w A/P as above.  Kelly Splinter MD [employer] pager [phone]   07/31/2018, 2:24 PM

## 2018-07-31 NOTE — H&P (Signed)
   INTERVAL PROCEDURE H&P  History and Physical Interval Note:  07/31/2018 8:09 AM  Dena Billet has presented today for their planned procedure. The various methods of treatment have been discussed with the patient and family. After consideration of risks, benefits and other options for treatment, the patient has consented to the procedure.  The patients' outpatient history has been reviewed, patient examined, and no change in status from most recent office note within the past 30 days. I have reviewed the patients' chart and labs and will proceed as planned. Questions were answered to the patient's satisfaction.   Pixie Casino, MD, [facility], [place] Director of the Advanced Lipid Disorders &  Cardiovascular Risk Reduction Clinic Diplomate of the American Board of Clinical Lipidology Attending Cardiologist  Direct Dial: [phone]  Fax: [phone]  Website:  [email or web address] 07/31/2018, 8:09 AM

## 2018-07-31 NOTE — Progress Notes (Signed)
[facility] for Infectious Disease  Date of Admission:  07/27/2018             ASSESSMENT/PLAN  Roger Vazquez continues to receive treatment for MSSA bacteremia with unclear source although may be related to staple from his surgery. He is on Day 5 of therapy with Cefazolin.  TEE negative for endocarditis. Clinically he is doing well. He did have mildly elevated temperature overnight. Repeat cultures are without growth to date.  1. Continue Ancef with goal of 2 weeks of therapy with dialysis. End date of 10/31 2. Monitor cultures and fever curve. 3. OPAT orders placed.  Diagnosis:  MSSA bacteremia  Culture Result: MSSA  No Known Allergies  OPAT Orders Discharge antibiotics: Ancef Per pharmacy protocol   Duration: 2 weeks End Date: 08/14/18  Labs weekly while on IV antibiotics: __ CBC with differential __ BMP __ CMP __ CRP __ ESR __ Vancomycin trough __ CK  Fax weekly labs to [phone]  Clinic Follow Up Appt:    @    ID will be available as need Principal Problem:   Bacteremia due to methicillin susceptible Staphylococcus aureus (MSSA) Active Problems:   HEPATITIS C   Essential hypertension   Sepsis ([place])   ESRD (end stage renal disease) on dialysis ([place])   . aspirin EC  81 mg Oral Daily  . Chlorhexidine Gluconate Cloth  6 each Topical Q0600  . [START ON 08/01/2018] Chlorhexidine Gluconate Cloth  6 each Topical Q0600  . darbepoetin (ARANESP) injection - DIALYSIS  100 mcg Intravenous Q Wed-HD  . heparin  5,000 Units Subcutaneous Q8H  . insulin aspart  0-9 Units Subcutaneous TID WC  . multivitamin with minerals  1 tablet Oral Daily  . pantoprazole  40 mg Oral BID  . sodium chloride flush  3 mL Intravenous Q12H    SUBJECTIVE:  Mildly elevated temperature this morning with no leukocytosis. TEE negative for vegetations. Repeat cultures remain without growth to date.   Feeling good. No new complaints overnight.    No Known  Allergies   Review of Systems: Review of Systems  Constitutional: Negative for chills and fever.  Respiratory: Negative for cough, shortness of breath and wheezing.   Cardiovascular: Negative for chest pain.  Gastrointestinal: Negative for abdominal pain, constipation, diarrhea, nausea and vomiting.  Skin: Negative for rash.      OBJECTIVE: Vitals:   07/31/18 0920 07/31/18 0926 07/31/18 0927 07/31/18 1445  BP: (!) 80/46 (!) 90/37 (!) 90/37 (!) 110/47  Pulse: 80 80 79 80  Resp: 13 (!) 22 (!) 26 20  Temp: 98.8 F (37.1 C)   98 F (36.7 C)  TempSrc: Oral   Oral  SpO2: 99% 99% 99% 99%  Weight:      Height:       Body mass index is 20.78 kg/m.  Physical Exam  Constitutional: He is oriented to person, place, and time. He appears well-developed. No distress.  Cardiovascular: Normal rate, regular rhythm and intact distal pulses.  Murmur heard. Left upper arm fistula with bruit and thrill present. No evidence of infection.   Pulmonary/Chest: Effort normal and breath sounds normal.  Neurological: He is alert and oriented to person, place, and time.  Skin: Skin is warm and dry.  Psychiatric: He has a normal mood and affect. His behavior is normal. Judgment and thought content normal.    Lab Results Lab Results  Component Value Date   WBC 7.0 07/30/2018   HGB 8.1 (L) 07/30/2018  HCT 25.1 (L) 07/30/2018   MCV 103.7 (H) 07/30/2018   PLT 82 (L) 07/30/2018    Lab Results  Component Value Date   CREATININE 6.55 (H) 07/31/2018   BUN 29 (H) 07/31/2018   NA 131 (L) 07/31/2018   K 3.3 (L) 07/31/2018   CL 94 (L) 07/31/2018   CO2 21 (L) 07/31/2018    Lab Results  Component Value Date   ALT 22 07/27/2018   AST 42 (H) 07/27/2018   ALKPHOS 66 07/27/2018   BILITOT 0.8 07/27/2018     Microbiology: Recent Results (from the past 240 hour(s))  Culture, blood (Routine x 2)     Status: Abnormal   Collection Time: 07/27/18 11:04 AM  Result Value Ref Range Status   Specimen  Description BLOOD BLOOD RIGHT FOREARM  Final   Special Requests   Final    BOTTLES DRAWN AEROBIC ONLY Blood Culture adequate volume   Culture  Setup Time   Final    AEROBIC BOTTLE ONLY GRAM POSITIVE COCCI CRITICAL RESULT CALLED TO, READ BACK BY AND VERIFIED WITH: J.LEDFORD,PHARMD AT 0500 ON 07/28/18 BY G.MCADOO Performed at [facility], [place] [address], [place], [place] [address]    Culture STAPHYLOCOCCUS AUREUS (A)  Final   Report Status 07/30/2018 FINAL  Final   Organism ID, Bacteria STAPHYLOCOCCUS AUREUS  Final      Susceptibility   Staphylococcus aureus - MIC*    CIPROFLOXACIN <=0.5 SENSITIVE Sensitive     ERYTHROMYCIN <=0.25 SENSITIVE Sensitive     GENTAMICIN <=0.5 SENSITIVE Sensitive     OXACILLIN <=0.25 SENSITIVE Sensitive     TETRACYCLINE <=1 SENSITIVE Sensitive     VANCOMYCIN <=0.5 SENSITIVE Sensitive     TRIMETH/SULFA <=10 SENSITIVE Sensitive     CLINDAMYCIN <=0.25 SENSITIVE Sensitive     RIFAMPIN <=0.5 SENSITIVE Sensitive     Inducible Clindamycin NEGATIVE Sensitive     * STAPHYLOCOCCUS AUREUS  Blood Culture ID Panel (Reflexed)     Status: Abnormal   Collection Time: 07/27/18 11:04 AM  Result Value Ref Range Status   Enterococcus species NOT DETECTED NOT DETECTED Final   Listeria monocytogenes NOT DETECTED NOT DETECTED Final   Staphylococcus species DETECTED (A) NOT DETECTED Final    Comment: CRITICAL RESULT CALLED TO, READ BACK BY AND VERIFIED WITH: J.LEDFORD,PHARMD AT 0500 ON 07/28/18 BY G.MCADOO    Staphylococcus aureus (BCID) DETECTED (A) NOT DETECTED Final    Comment: CRITICAL RESULT CALLED TO, READ BACK BY AND VERIFIED WITH: J.LEDFORD,PHARMD AT 0500 ON 07/28/18 BY G.MCADOO Methicillin (oxacillin) susceptible Staphylococcus aureus (MSSA). Preferred therapy is anti staphylococcal beta lactam antibiotic (Cefazolin or Nafcillin), unless clinically contraindicated.    Methicillin resistance NOT DETECTED NOT DETECTED Final   Streptococcus species NOT DETECTED  NOT DETECTED Final   Streptococcus agalactiae NOT DETECTED NOT DETECTED Final   Streptococcus pneumoniae NOT DETECTED NOT DETECTED Final   Streptococcus pyogenes NOT DETECTED NOT DETECTED Final   Acinetobacter baumannii NOT DETECTED NOT DETECTED Final   Enterobacteriaceae species NOT DETECTED NOT DETECTED Final   Enterobacter cloacae complex NOT DETECTED NOT DETECTED Final   Escherichia coli NOT DETECTED NOT DETECTED Final   Klebsiella oxytoca NOT DETECTED NOT DETECTED Final   Klebsiella pneumoniae NOT DETECTED NOT DETECTED Final   Proteus species NOT DETECTED NOT DETECTED Final   Serratia marcescens NOT DETECTED NOT DETECTED Final   Haemophilus influenzae NOT DETECTED NOT DETECTED Final   Neisseria meningitidis NOT DETECTED NOT DETECTED Final   Pseudomonas aeruginosa NOT DETECTED NOT DETECTED  Final   Candida albicans NOT DETECTED NOT DETECTED Final   Candida glabrata NOT DETECTED NOT DETECTED Final   Candida krusei NOT DETECTED NOT DETECTED Final   Candida parapsilosis NOT DETECTED NOT DETECTED Final   Candida tropicalis NOT DETECTED NOT DETECTED Final    Comment: Performed at [facility], [place] [address] [place], [place] [address]  Culture, blood (Routine x 2)     Status: Abnormal   Collection Time: 07/27/18 11:11 AM  Result Value Ref Range Status   Specimen Description BLOOD BLOOD RIGHT ARM  Final   Special Requests   Final    BOTTLES DRAWN AEROBIC ONLY Blood Culture results may not be optimal due to an inadequate volume of blood received in culture bottles   Culture  Setup Time   Final    GRAM POSITIVE COCCI IN CLUSTERS AEROBIC BOTTLE ONLY CRITICAL VALUE NOTED.  VALUE IS CONSISTENT WITH PREVIOUSLY REPORTED AND CALLED VALUE.    Culture (A)  Final    STAPHYLOCOCCUS AUREUS SUSCEPTIBILITIES PERFORMED ON PREVIOUS CULTURE WITHIN THE LAST 5 DAYS. Performed at [facility], [place] [address], [place], [place] [address]    Report Status 07/30/2018 FINAL  Final  C difficile  quick scan w PCR reflex     Status: None   Collection Time: 07/27/18  9:28 PM  Result Value Ref Range Status   C Diff antigen NEGATIVE NEGATIVE Final   C Diff toxin NEGATIVE NEGATIVE Final   C Diff interpretation No C. difficile detected.  Final    Comment: Performed at [facility], [place] [address] [place], [place] [address]  MRSA PCR Screening     Status: None   Collection Time: 07/28/18  1:30 AM  Result Value Ref Range Status   MRSA by PCR NEGATIVE NEGATIVE Final    Comment:        The GeneXpert MRSA Assay (FDA approved for NASAL specimens only), is one component of a comprehensive MRSA colonization surveillance program. It is not intended to diagnose MRSA infection nor to guide or monitor treatment for MRSA infections. Performed at [facility], [place] [address], [place], [place] [address]   Culture, blood (routine x 2)     Status: None (Preliminary result)   Collection Time: 07/29/18  5:00 PM  Result Value Ref Range Status   Specimen Description BLOOD RIGHT HAND  Final   Special Requests   Final    BOTTLES DRAWN AEROBIC ONLY Blood Culture adequate volume   Culture   Final    NO GROWTH 2 DAYS Performed at [facility], [place] [address], [place], [place] [address]    Report Status PENDING  Incomplete  Culture, blood (routine x 2)     Status: None (Preliminary result)   Collection Time: 07/29/18  5:07 PM  Result Value Ref Range Status   Specimen Description BLOOD RIGHT FOREARM  Final   Special Requests   Final    BOTTLES DRAWN AEROBIC ONLY Blood Culture adequate volume   Culture   Final    NO GROWTH 2 DAYS Performed at [facility], [address], [place],  [address]    Report Status PENDING  Incomplete     Terri Piedra, [place] [facility] [employer] [facility] [phone] Pager  07/31/2018  5:15 PM

## 2018-07-31 NOTE — Progress Notes (Signed)
Pharmacy Antibiotic Note  Roger Vazquez is a 67 y.o. male admitted on 07/27/2018 with MSSA  bacteremia.  Pharmacy has been consulted for cefazolin dosing. Patient has ESRD and receives HD on MWF. He also has a history of a new fistula.   He is on a stable MWF schedule now. Repeat cultures have been neg. TEE neg for vegetation. ID will determine the length of therapy.   Plan: Cont cefazolin 2 gm every MWF with HD   Height: 5\' 6"  (167.6 cm) Weight: 128 lb 12 oz (58.4 kg) IBW/kg (Calculated) : 63.8  Temp (24hrs), Avg:99.4 F (37.4 C), Min:98.4 F (36.9 C), Max:100.3 F (37.9 C)  Recent Labs  Lab 07/27/18 1116 07/27/18 1124  07/27/18 1405  07/27/18 1529 07/28/18 0450 07/29/18 0757 07/29/18 0830 07/30/18 0832 07/30/18 0833 07/31/18 0656  WBC 8.7  --   --   --   --   --  7.1 6.2  --  7.0  --   --   CREATININE  --   --    < >  --    < > 12.78* 13.66*  --  14.54*  --  8.64* 6.55*  LATICACIDVEN  --  2.66*  --  0.94  --   --   --   --   --   --   --   --    < > = values in this interval not displayed.    Estimated Creatinine Clearance: 8.9 mL/min (A) (by C-G formula based on SCr of 6.55 mg/dL (H)).    No Known Allergies  Antimicrobials this admission: 10/13 Vanc>>10/14 10/13 cefepime >>10/14 10/13 flagyl>>10/14 10/14 cefazolin>   Onnie Boer, PharmD, [place], [place], CPP Infectious Disease Pharmacist Pager: [phone] 07/31/2018 2:26 PM

## 2018-07-31 NOTE — Anesthesia Procedure Notes (Signed)
Procedure Name: MAC Date/Time: 07/31/2018 9:05 AM Performed by: Lavell Luster, CRNA Pre-anesthesia Checklist: Patient identified, Emergency Drugs available, Patient being monitored, Suction available and Timeout performed Patient Re-evaluated:Patient Re-evaluated prior to induction Oxygen Delivery Method: Nasal cannula Induction Type: IV induction Placement Confirmation: positive ETCO2 Dental Injury: Teeth and Oropharynx as per pre-operative assessment

## 2018-07-31 NOTE — Progress Notes (Signed)
*  PRELIMINARY RESULTS* Echocardiogram Echocardiogram Transesophageal has been performed.  Roger Vazquez 07/31/2018, 9:37 AM

## 2018-07-31 NOTE — Transfer of Care (Signed)
Immediate Anesthesia Transfer of Care Note  Patient: Roger Vazquez  Procedure(s) Performed: TRANSESOPHAGEAL ECHOCARDIOGRAM (TEE) (N/A )  Patient Location: Endoscopy Unit  Anesthesia Type:MAC  Level of Consciousness: sedated  Airway & Oxygen Therapy: Patient connected to nasal cannula oxygen  Post-op Assessment: Post -op Vital signs reviewed and stable  Post vital signs: stable  Last Vitals:  Vitals Value Taken Time  BP    Temp    Pulse    Resp    SpO2      Last Pain:  Vitals:   07/31/18 0803  TempSrc: Oral  PainSc: 0-No pain         Complications: No apparent anesthesia complications

## 2018-07-31 NOTE — CV Procedure (Signed)
TRANSESOPHAGEAL ECHOCARDIOGRAM (TEE) NOTE  INDICATIONS: infective endocarditis  PROCEDURE:   Informed consent was obtained prior to the procedure. The risks, benefits and alternatives for the procedure were discussed and the patient comprehended these risks.  Risks include, but are not limited to, cough, sore throat, vomiting, nausea, somnolence, esophageal and stomach trauma or perforation, bleeding, low blood pressure, aspiration, pneumonia, infection, trauma to the teeth and death.    After a procedural time-out, the patient was given propofol per anesthesia for sedation.  The patient's heart rate, blood pressure, and oxygen saturation are monitored continuously during the procedure.The oropharynx was anesthetized with 1 spray of topical cetacaine.  The transesophageal probe was inserted in the esophagus and stomach without difficulty and multiple views were obtained.  The patient was kept under observation until the patient left the procedure room.  The patient left the procedure room in stable condition.   Agitated microbubble saline contrast was not administered.  COMPLICATIONS:    There were no immediate complications.  Findings:  1. LEFT VENTRICLE: The left ventricular wall thickness is moderately increased.  The left ventricular cavity is normal in size. Wall motion is normal.  LVEF is 60-65%.  2. RIGHT VENTRICLE:  The right ventricle is normal in structure and function without any thrombus or masses.    3. LEFT ATRIUM:  The left atrium is mildly dilated in size without any thrombus or masses.  There is not spontaneous echo contrast ("smoke") in the left atrium consistent with a low flow state.  4. LEFT ATRIAL APPENDAGE:  The left atrial appendage is free of any thrombus or masses. The appendage has single lobes. Pulse doppler indicates moderate flow in the appendage.  5. ATRIAL SEPTUM:  The atrial septum appears intact and is free of thrombus and/or masses.  There is no  evidence for interatrial shunting by color doppler.  6. RIGHT ATRIUM:  The right atrium is normal in size and function without any thrombus or masses.  7. MITRAL VALVE:  The mitral valve is normal in structure and function with trivial regurgitation.  There were no vegetations or stenosis.  8. AORTIC VALVE:  The aortic valve is trileaflet, normal in structure and function with no regurgitation.  There were no vegetations or stenosis  9. TRICUSPID VALVE:  The tricuspid valve is normal in structure and function with trivial regurgitation.  There were no vegetations or stenosis  10.  PULMONIC VALVE:  The pulmonic valve is normal in structure and function with no regurgitation.  There were no vegetations or stenosis.   11. AORTIC ARCH, ASCENDING AND DESCENDING AORTA:  There was grade 1 Ron Parker et. Al, 1992) atherosclerosis of the ascending aorta, aortic arch, or proximal descending aorta.  12. PULMONARY VEINS: Anomalous pulmonary venous return was not noted.  13. PERICARDIUM: The pericardium appeared normal and non-thickened.  There is a Minimal pericardial effusion.  IMPRESSION:   1. No evidence of endocarditis. 2. No LAA thrombus 3. Negative for PFO by color doppler 4. Moderate LVH 5. LVEF 60-65%  RECOMMENDATIONS:    1.  Treatment for MSSA bacteremia, no evidence for endocarditis.  Time Spent Directly with the Patient:  45 minutes   Pixie Casino, MD, [facility], [place] Director of the Advanced Lipid Disorders &  Cardiovascular Risk Reduction Clinic Diplomate of the American Board of Clinical Lipidology Attending Cardiologist  Direct Dial: [phone]  Fax: [phone]  Website:  [email or web address] Osgood Cleven Jansma 07/31/2018, 9:16 AM

## 2018-08-01 ENCOUNTER — Inpatient Hospital Stay (HOSPITAL_COMMUNITY): Payer: Medicare PPO

## 2018-08-01 DIAGNOSIS — D649 Anemia, unspecified: Secondary | ICD-10-CM

## 2018-08-01 LAB — CBC
HCT: 23.6 % — ABNORMAL LOW (ref 39.0–52.0)
HEMOGLOBIN: 7.6 g/dL — AB (ref 13.0–17.0)
MCH: 32.9 pg (ref 26.0–34.0)
MCHC: 32.2 g/dL (ref 30.0–36.0)
MCV: 102.2 fL — ABNORMAL HIGH (ref 80.0–100.0)
PLATELETS: 123 10*3/uL — AB (ref 150–400)
RBC: 2.31 MIL/uL — AB (ref 4.22–5.81)
RDW: 14.3 % (ref 11.5–15.5)
WBC: 7.5 10*3/uL (ref 4.0–10.5)
nRBC: 0 % (ref 0.0–0.2)

## 2018-08-01 LAB — RENAL FUNCTION PANEL
ALBUMIN: 2.4 g/dL — AB (ref 3.5–5.0)
ANION GAP: 14 (ref 5–15)
BUN: 47 mg/dL — ABNORMAL HIGH (ref 8–23)
CO2: 20 mmol/L — ABNORMAL LOW (ref 22–32)
Calcium: 7.2 mg/dL — ABNORMAL LOW (ref 8.9–10.3)
Chloride: 95 mmol/L — ABNORMAL LOW (ref 98–111)
Creatinine, Ser: 8.51 mg/dL — ABNORMAL HIGH (ref 0.61–1.24)
GFR, EST AFRICAN AMERICAN: 7 mL/min — AB (ref >60)
GFR, EST NON AFRICAN AMERICAN: 6 mL/min — AB (ref >60)
Glucose, Bld: 218 mg/dL — ABNORMAL HIGH (ref 70–99)
PHOSPHORUS: 2.9 mg/dL (ref 2.5–4.6)
POTASSIUM: 2.8 mmol/L — AB (ref 3.5–5.1)
Sodium: 129 mmol/L — ABNORMAL LOW (ref 135–145)

## 2018-08-01 LAB — GLUCOSE, CAPILLARY
GLUCOSE-CAPILLARY: 163 mg/dL — AB (ref 70–99)
GLUCOSE-CAPILLARY: 385 mg/dL — AB (ref 70–99)
Glucose-Capillary: 213 mg/dL — ABNORMAL HIGH (ref 70–99)

## 2018-08-01 MED ORDER — HEPARIN SODIUM (PORCINE) 1000 UNIT/ML DIALYSIS
4000.0000 [IU] | INTRAMUSCULAR | Status: DC | PRN
  Administered 2018-08-01: 4000 [IU] via INTRAVENOUS_CENTRAL
  Filled 2018-08-01: qty 4

## 2018-08-01 MED ORDER — PENTAFLUOROPROP-TETRAFLUOROETH EX AERO: 1.0000 "application " | INHALATION_SPRAY | CUTANEOUS | Status: DC | PRN

## 2018-08-01 MED ORDER — SODIUM CHLORIDE 0.9 % IV SOLN: 100.0000 mL | INTRAVENOUS | Status: DC | PRN

## 2018-08-01 MED ORDER — HEPARIN SODIUM (PORCINE) 1000 UNIT/ML DIALYSIS: 1000.0000 [IU] | INTRAMUSCULAR | Status: DC | PRN

## 2018-08-01 MED ORDER — HEPARIN SODIUM (PORCINE) 1000 UNIT/ML IJ SOLN
INTRAMUSCULAR | Status: AC
  Administered 2018-08-01: 4000 [IU] via INTRAVENOUS_CENTRAL
  Filled 2018-08-01: qty 4

## 2018-08-01 NOTE — Progress Notes (Addendum)
Inpatient Diabetes Program Recommendations  AACE/ADA: New Consensus Statement on Inpatient Glycemic Control (2015)  Target Ranges:  Prepandial:   less than 140 mg/dL      Peak postprandial:   less than 180 mg/dL (1-2 hours)      Critically ill patients:  140 - 180 mg/dL   Lab Results  Component Value Date   GLUCAP 213 (H) 08/01/2018   HGBA1C 8.2 (H) 07/28/2018    Review of Glycemic Control Results for [name], [name] (MRN [id number]) as of 08/01/2018 11:18  Ref. Range 07/31/2018 12:11 07/31/2018 17:02 07/31/2018 21:14 08/01/2018 08:24  Glucose-Capillary Latest Ref Range: 70 - 99 mg/dL 202 (H) 285 (H) 299 (H) 213 (H)   Diabetes history: Type 2 DM Outpatient Diabetes medications: none Current orders for Inpatient glycemic control: Novolog 0-9 units TID  Inpatient Diabetes Program Recommendations:    Consider adding Lantus 8 units QHS if FSBS continue to remain >180 mg/dL. Also, noted that patient is missing several doses of correction. One, due to patient was in hemodialysis, another was not documented. This may have prevented FSBS being 213 mg/dL.  Thanks, Bronson Curb, MSN, RNC-OB Diabetes Coordinator [phone] (8a-5p)

## 2018-08-01 NOTE — Progress Notes (Addendum)
[facility] Progress Note   Subjective:  Seen on HD -- no specific complaints today. Denies CP/dyspnea. Objective Vitals:   08/01/18 0941 08/01/18 1000 08/01/18 1030 08/01/18 1100  BP: (!) 132/92 (!) 142/62 (!) 132/92 135/64  Pulse: 72 81 72 81  Resp:      Temp:      TempSrc:      SpO2:      Weight:      Height:       Physical Exam General: Frail, elderly man. NAD. Heart: RRR; no murmur Lungs: CTA anteriorly Abdomen: soft, non-tender Extremities: No LE edema Dialysis Access: AVG + bruit  Additional Objective Labs: Basic Metabolic Panel: Recent Labs  Lab 07/29/18 0830 07/30/18 0833 07/31/18 0656 08/01/18 0942  NA 133* 134* 131* 129*  K 3.3* 3.0* 3.3* 2.8*  CL 103 101 94* 95*  CO2 14* 18* 21* 20*  GLUCOSE 143* 198* 211* 218*  BUN 110* 44* 29* 47*  CREATININE 14.54* 8.64* 6.55* 8.51*  CALCIUM 7.1* 7.0* 7.1* 7.2*  PHOS 4.9* 4.0  --  2.9   Liver Function Tests: Recent Labs  Lab 07/27/18 1357 07/27/18 1529 07/29/18 0830 07/30/18 0833 08/01/18 0942  AST 20 42*  --   --   --   ALT 11 22  --   --   --   ALKPHOS 30* 66  --   --   --   BILITOT 0.5 0.8  --   --   --   PROT 3.3* 7.2  --   --   --   ALBUMIN 1.5* 3.4* 2.7* 2.6* 2.4*   CBC: Recent Labs  Lab 07/27/18 1116  07/28/18 0450 07/29/18 0757 07/30/18 0832 08/01/18 0944  WBC 8.7  --  7.1 6.2 7.0 7.5  NEUTROABS 7.8*  --   --   --   --   --   HGB 10.7*   < > 9.0* 7.9* 8.1* 7.6*  HCT 35.2*   < > 28.0* 24.2* 25.1* 23.6*  MCV 111.7*  --  105.3* 103.4* 103.7* 102.2*  PLT 106*  --  88* 87* 82* 123*   < > = values in this interval not displayed.   CBG: Recent Labs  Lab 07/31/18 0812 07/31/18 1211 07/31/18 1702 07/31/18 2114 08/01/18 0824  GLUCAP 193* 202* 285* 299* 213*   Medications: . sodium chloride    . sodium chloride    . sodium chloride    .  ceFAZolin (ANCEF) IV 2 g (07/30/18 1440)   . aspirin EC  81 mg Oral Daily  . Chlorhexidine Gluconate Cloth  6 each Topical Q0600   . Chlorhexidine Gluconate Cloth  6 each Topical Q0600  . darbepoetin (ARANESP) injection - DIALYSIS  100 mcg Intravenous Q Wed-HD  . heparin  5,000 Units Subcutaneous Q8H  . insulin aspart  0-9 Units Subcutaneous TID WC  . multivitamin with minerals  1 tablet Oral Daily  . pantoprazole  40 mg Oral BID  . sodium chloride flush  3 mL Intravenous Q12H   Dialysis Orders: MWF at [facility] 3h 80min 400/AF1.5 59kg 2K/3.5Ca P4 Hep 4000  - venofer 50/wk - mircera 30 ug last 10/2 - calcitriol 0.64mcg qHD  Home meds: - amlodipine 5/ hydralazine 50 tid - pantoprazole 40 bid/ MVI/ aspirin 81 mg - hydrocodone- aceta prn/ methocarbamol 500 bid prn  Assessment/Plan: 1. MSSA bacteremia/ stitch infection: mild purulence at wound site resolved, stitches out. ID following. F/U cx's neg. TEE showed no evidence endocarditis, EF 60-65%.  Plan is Cefazolin with HD x 2 weeks. 2. ESRD: HD per usual MWF schedule. HD now. 3. Anemia of CKD: Hgb down, Aranesp 144mcg given 10/16. 4. MBD: CorrCa/Phos ok. Continue home meds. 5. Nutrition: Alb low (2.4).  6. HTN/Volume: BP low at times; no meds. 7. Hx cocaine/ETOH abuse 8. Hx Hep C 9. Dispo - ok to dc from renal standpoint  Veneta Penton, PA-C 08/01/2018, 12:00 PM  [facility] Pager: [phone]  Pt seen, examined and agree w A/P as above.  Kelly Splinter MD [employer] pager [phone]   08/01/2018, 12:21 PM

## 2018-08-01 NOTE — Progress Notes (Signed)
   08/01/18 1000  PT Visit Information  Last PT Received On 08/01/18  Reason Eval/Treat Not Completed Patient at procedure or test/unavailable   Pt was not available and will try later as time and pt allow.  Mee Hives, PT MS Acute Rehab Dept. Number: [place] and [place]

## 2018-08-01 NOTE — Progress Notes (Signed)
Still in HD and will try again at another time.   08/01/18 1300  PT Visit Information  Last PT Received On 08/01/18  Reason Eval/Treat Not Completed Patient at procedure or test/unavailable   Mee Hives, PT MS Acute Rehab Dept. Number: [place] and [place]

## 2018-08-01 NOTE — Progress Notes (Signed)
PROGRESS NOTE                                                                                                                                                                                                             Patient Demographics:    Roger Vazquez, is a 67 y.o. male, DOB - 02/18/1951, GYB:[id number]  Admit date - 07/27/2018   Admitting Physician Merton Border, MD  Outpatient Primary MD for the patient is [facility], [facility]  LOS - 5  Chief Complaint  Patient presents with  . Altered/Sepsis       Brief Narrative  Roger Vazquez  is a 67 y.o. male, with past medical history significant for end-stage renal disease on hemodialysis who was seen good last on Friday, sent to our emergency room for evaluation due to altered mental status and fever.  Patient was found covered with stool and had a high fever upon admission, with no obvious source, further work-up showed that he had MSSA bacteremia.   Subjective:   Patient in bed, appears comfortable, denies any headache, no fever, no chest pain or pressure, no shortness of breath , no abdominal pain. No focal weakness.   Assessment  & Plan :     1.  Sepsis with MSSA bacteremia.  Source unclear.  Does have a mild cough and C. difficile negative diarrhea, left arm dialysis graft site stable on exam.  Currently on Ancef, ID on board.  Stable TTE and TEE and L. AV Graft [place] have been ordered and istill pending, so far negative repeat surveillance cultures which were drawn on 07/29/2018.  Clinically sepsis physiology seems to have resolved will monitor, ID following, also seen by VVS and Renal.  If remains stable likely discharge on 08/02/18 after dialysis today with total of 2 weeks of IV cefazolin unless changed by ID.  2.  ESRD.  Dialysis schedule Monday, Wednesday and Friday. Renal on board.  3.  History of cocaine and alcohol abuse.  Says he has quit both for several years.  Will monitor.  4.  History of Hepatitis C.   Outpatient GI/ID follow-up as needed been no acute issues.  5.  Diarrhea.  C. difficile negative.  Resolved with PRN Imodium.  6.  Essential hypertension.  Pressure currently soft, PRN hydralazine only and monitor.  7.  GERD.  On PPI.  8.  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  These have been present for at least one month and are not  worsening, outpatient ENT ophthalmology follow-up.    9.  Did have DM type II previously and became noncompliant with his medications for unclear reasons.  Diabetic education provided.  We will go home on insulin.  Lab Results  Component Value Date   HGBA1C 8.2 (H) 07/28/2018   CBG (last 3)  Recent Labs    07/31/18 1702 07/31/18 2114 08/01/18 0824  GLUCAP 285* 299* 213*      Family Communication  :  neighbor   Code Status :  Full  Disposition Plan  : Home in the next 1 to 2 days  Consults  :  ID, Renal  Procedures  :    TEE   1. No evidence of endocarditis. 2. No LAA thrombus 3. Negative for PFO by color doppler 4. Moderate LVH 5. LVEF 60-65% 6.   L.ARM Graft [place]   TTE -   - Left ventricle: The cavity size was normal. Wall thickness was increased in a pattern of mild LVH. Systolic function was normal. The estimated ejection fraction was in the range of 60% to 65%. Wall motion was normal; there were no regional wall motion abnormalities. - Aortic valve: Transvalvular velocity was within the normal range. There was no stenosis. There was no regurgitation. - Mitral valve: Transvalvular velocity was within the normal range. There was no evidence for stenosis. There was no regurgitation. - Left atrium: The atrium was mildly dilated. - Right ventricle: The cavity size was normal. Wall thickness was normal. Systolic function was normal. - Atrial septum: No defect or patent foramen ovale was identified by color flow Doppler. - Tricuspid valve: There was trivial regurgitation. - Pulmonary arteries: Systolic  pressure was within the normal range. PA peak pressure: 20 mm Hg (S).  DVT Prophylaxis  :   Heparin    Lab Results  Component Value Date   PLT 82 (L) 07/30/2018    Diet :  Diet Order            Diet renal/carb modified with fluid restriction Fluid restriction: 1500 mL Fluid; Room service appropriate? Yes; Fluid consistency: Thin  Diet effective now               Inpatient Medications Scheduled Meds: . aspirin EC  81 mg Oral Daily  . Chlorhexidine Gluconate Cloth  6 each Topical Q0600  . Chlorhexidine Gluconate Cloth  6 each Topical Q0600  . darbepoetin (ARANESP) injection - DIALYSIS  100 mcg Intravenous Q Wed-HD  . heparin  5,000 Units Subcutaneous Q8H  . insulin aspart  0-9 Units Subcutaneous TID WC  . multivitamin with minerals  1 tablet Oral Daily  . pantoprazole  40 mg Oral BID  . sodium chloride flush  3 mL Intravenous Q12H   Continuous Infusions: . sodium chloride    .  ceFAZolin (ANCEF) IV 2 g (07/30/18 1440)   PRN Meds:.sodium chloride, acetaminophen, albuterol, chlorproMAZINE, loperamide, ondansetron **OR** ondansetron (ZOFRAN) IV, sodium chloride flush  Antibiotics  :   Anti-infectives (From admission, onward)   Start     Dose/Rate Route Frequency Ordered Stop   07/30/18 1200  ceFAZolin (ANCEF) IVPB 2g/100 mL premix     2 g 200 mL/hr over 30 Minutes Intravenous Every M-W-F (Hemodialysis) 07/28/18 1329     07/29/18 1445  ceFAZolin (ANCEF) IVPB 2g/100 mL premix  Status:  Discontinued     2 g 200 mL/hr over 30 Minutes Intravenous STAT 07/29/18 1432 07/29/18 1532   07/29/18 1200  ceFAZolin (ANCEF) IVPB 2g/100 mL premix  2 g 200 mL/hr over 30 Minutes Intravenous  Once 07/28/18 1329 07/29/18 1456   07/28/18 1800  ceFEPIme (MAXIPIME) 2 g in sodium chloride 0.9 % 100 mL IVPB  Status:  Discontinued     2 g 200 mL/hr over 30 Minutes Intravenous Every M-W-F (Hemodialysis) 07/27/18 1239 07/28/18 0913   07/28/18 1200  vancomycin (VANCOCIN) IVPB 750 mg/150 ml premix   Status:  Discontinued     750 mg 150 mL/hr over 60 Minutes Intravenous Every M-W-F (Hemodialysis) 07/27/18 1239 07/28/18 0913   07/28/18 1200  ceFAZolin (ANCEF) IVPB 2g/100 mL premix  Status:  Discontinued     2 g 200 mL/hr over 30 Minutes Intravenous Every M-W-F (Hemodialysis) 07/28/18 0913 07/28/18 1329   07/27/18 1115  ceFEPIme (MAXIPIME) 2 g in sodium chloride 0.9 % 100 mL IVPB     2 g 200 mL/hr over 30 Minutes Intravenous  Once 07/27/18 1114 07/27/18 1257   07/27/18 1115  metroNIDAZOLE (FLAGYL) IVPB 500 mg  Status:  Discontinued     500 mg 100 mL/hr over 60 Minutes Intravenous Every 8 hours 07/27/18 1114 07/28/18 0913   07/27/18 1115  vancomycin (VANCOCIN) IVPB 1000 mg/200 mL premix     1,000 mg 200 mL/hr over 60 Minutes Intravenous  Once 07/27/18 1114 07/27/18 1427          Objective:   Vitals:   07/31/18 1445 07/31/18 2113 08/01/18 0500 08/01/18 0625  BP: (!) 110/47 91/64  (!) 125/57  Pulse: 80 86  79  Resp: 20 17  18   Temp: 98 F (36.7 C) 98.1 F (36.7 C)  98.7 F (37.1 C)  TempSrc: Oral     SpO2: 99% 100%  100%  Weight:   62.4 kg   Height:        Wt Readings from Last 3 Encounters:  08/01/18 62.4 kg  09/13/14 62.6 kg  10/12/11 69.3 kg     Intake/Output Summary (Last 24 hours) at 08/01/2018 0937 Last data filed at 08/01/2018 0556 Gross per 24 hour  Intake 120 ml  Output -  Net 120 ml     Physical Exam  Awake Alert, Oriented X 3, No new F.N deficits, Normal affect .AT,PERRAL Supple Neck,No JVD, No cervical lymphadenopathy appriciated.  Symmetrical Chest wall movement, Good air movement bilaterally, CTAB RRR,No Gallops, Rubs or new Murmurs, No Parasternal Heave +ve B.Sounds, Abd Soft, No tenderness, No organomegaly appriciated, No rebound - guarding or rigidity. No Cyanosis, Clubbing or edema, L arm Graft under bandage - not warm, + ve bruit    Data Review:    CBC Recent Labs  Lab 07/27/18 1116 07/27/18 1449 07/28/18 0450 07/29/18 0757  07/30/18 0832  WBC 8.7  --  7.1 6.2 7.0  HGB 10.7* 11.9* 9.0* 7.9* 8.1*  HCT 35.2* 35.0* 28.0* 24.2* 25.1*  PLT 106*  --  88* 87* 82*  MCV 111.7*  --  105.3* 103.4* 103.7*  MCH 34.0  --  33.8 33.8 33.5  MCHC 30.4  --  32.1 32.6 32.3  RDW 15.2  --  14.8 14.9 14.7  LYMPHSABS 0.5*  --   --   --   --   MONOABS 0.3  --   --   --   --   EOSABS 0.0  --   --   --   --   BASOSABS 0.0  --   --   --   --     Chemistries  Recent Labs  Lab 07/27/18 1357  07/27/18  1529 07/28/18 0450 07/29/18 0830 07/30/18 0833 07/31/18 0656  NA 141   < > 131* 132* 133* 134* 131*  K UNABLE TO OBTAIN RESULT APPEARS EDTA CONTAMINANT   < > 3.8 3.3* 3.3* 3.0* 3.3*  CL 125*   < > 96* 99 103 101 94*  CO2 9*  --  18* 16* 14* 18* 21*  GLUCOSE 202*   < > 327* 334* 143* 198* 211*  BUN 52*   < > 101* 107* 110* 44* 29*  CREATININE 6.09*   < > 12.78* 13.66* 14.54* 8.64* 6.55*  CALCIUM  UNABLE TO OBTAIN RESULTS APPEARS EDTA CONTAMINANT  --  8.0* 7.4* 7.1* 7.0* 7.1*  AST 20  --  42*  --   --   --   --   ALT 11  --  22  --   --   --   --   ALKPHOS 30*  --  66  --   --   --   --   BILITOT 0.5  --  0.8  --   --   --   --    < > = values in this interval not displayed.   ------------------------------------------------------------------------------------------------------------------ No results for input(s): CHOL, HDL, LDLCALC, TRIG, CHOLHDL, LDLDIRECT in the last 72 hours.  Lab Results  Component Value Date   HGBA1C 8.2 (H) 07/28/2018   ------------------------------------------------------------------------------------------------------------------ No results for input(s): TSH, T4TOTAL, T3FREE, THYROIDAB in the last 72 hours.  Invalid input(s): FREET3 ------------------------------------------------------------------------------------------------------------------ No results for input(s): VITAMINB12, FOLATE, FERRITIN, TIBC, IRON, RETICCTPCT in the last 72 hours.  Coagulation profile Recent Labs  Lab  07/27/18 1116 07/31/18 0656  INR 0.99 1.08    No results for input(s): DDIMER in the last 72 hours.  Cardiac Enzymes No results for input(s): CKMB, TROPONINI, MYOGLOBIN in the last 168 hours.  Invalid input(s): CK ------------------------------------------------------------------------------------------------------------------ No results found for: BNP  Micro Results Recent Results (from the past 240 hour(s))  Culture, blood (Routine x 2)     Status: Abnormal   Collection Time: 07/27/18 11:04 AM  Result Value Ref Range Status   Specimen Description BLOOD BLOOD RIGHT FOREARM  Final   Special Requests   Final    BOTTLES DRAWN AEROBIC ONLY Blood Culture adequate volume   Culture  Setup Time   Final    AEROBIC BOTTLE ONLY GRAM POSITIVE COCCI CRITICAL RESULT CALLED TO, READ BACK BY AND VERIFIED WITH: J.LEDFORD,PHARMD AT 0500 ON 07/28/18 BY G.MCADOO Performed at [facility], [place] [address] [place], [place] [address]    Culture STAPHYLOCOCCUS AUREUS (A)  Final   Report Status 07/30/2018 FINAL  Final   Organism ID, Bacteria STAPHYLOCOCCUS AUREUS  Final      Susceptibility   Staphylococcus aureus - MIC*    CIPROFLOXACIN <=0.5 SENSITIVE Sensitive     ERYTHROMYCIN <=0.25 SENSITIVE Sensitive     GENTAMICIN <=0.5 SENSITIVE Sensitive     OXACILLIN <=0.25 SENSITIVE Sensitive     TETRACYCLINE <=1 SENSITIVE Sensitive     VANCOMYCIN <=0.5 SENSITIVE Sensitive     TRIMETH/SULFA <=10 SENSITIVE Sensitive     CLINDAMYCIN <=0.25 SENSITIVE Sensitive     RIFAMPIN <=0.5 SENSITIVE Sensitive     Inducible Clindamycin NEGATIVE Sensitive     * STAPHYLOCOCCUS AUREUS  Blood Culture ID Panel (Reflexed)     Status: Abnormal   Collection Time: 07/27/18 11:04 AM  Result Value Ref Range Status   Enterococcus species NOT DETECTED NOT DETECTED Final   Listeria monocytogenes NOT DETECTED NOT  DETECTED Final   Staphylococcus species DETECTED (A) NOT DETECTED Final    Comment: CRITICAL RESULT CALLED  TO, READ BACK BY AND VERIFIED WITH: J.LEDFORD,PHARMD AT 0500 ON 07/28/18 BY G.MCADOO    Staphylococcus aureus (BCID) DETECTED (A) NOT DETECTED Final    Comment: CRITICAL RESULT CALLED TO, READ BACK BY AND VERIFIED WITH: J.LEDFORD,PHARMD AT 0500 ON 07/28/18 BY G.MCADOO Methicillin (oxacillin) susceptible Staphylococcus aureus (MSSA). Preferred therapy is anti staphylococcal beta lactam antibiotic (Cefazolin or Nafcillin), unless clinically contraindicated.    Methicillin resistance NOT DETECTED NOT DETECTED Final   Streptococcus species NOT DETECTED NOT DETECTED Final   Streptococcus agalactiae NOT DETECTED NOT DETECTED Final   Streptococcus pneumoniae NOT DETECTED NOT DETECTED Final   Streptococcus pyogenes NOT DETECTED NOT DETECTED Final   Acinetobacter baumannii NOT DETECTED NOT DETECTED Final   Enterobacteriaceae species NOT DETECTED NOT DETECTED Final   Enterobacter cloacae complex NOT DETECTED NOT DETECTED Final   Escherichia coli NOT DETECTED NOT DETECTED Final   Klebsiella oxytoca NOT DETECTED NOT DETECTED Final   Klebsiella pneumoniae NOT DETECTED NOT DETECTED Final   Proteus species NOT DETECTED NOT DETECTED Final   Serratia marcescens NOT DETECTED NOT DETECTED Final   Haemophilus influenzae NOT DETECTED NOT DETECTED Final   Neisseria meningitidis NOT DETECTED NOT DETECTED Final   Pseudomonas aeruginosa NOT DETECTED NOT DETECTED Final   Candida albicans NOT DETECTED NOT DETECTED Final   Candida glabrata NOT DETECTED NOT DETECTED Final   Candida krusei NOT DETECTED NOT DETECTED Final   Candida parapsilosis NOT DETECTED NOT DETECTED Final   Candida tropicalis NOT DETECTED NOT DETECTED Final    Comment: Performed at [facility], [place] [address] [place], [place] [address]  Culture, blood (Routine x 2)     Status: Abnormal   Collection Time: 07/27/18 11:11 AM  Result Value Ref Range Status   Specimen Description BLOOD BLOOD RIGHT ARM  Final   Special Requests   Final     BOTTLES DRAWN AEROBIC ONLY Blood Culture results may not be optimal due to an inadequate volume of blood received in culture bottles   Culture  Setup Time   Final    GRAM POSITIVE COCCI IN CLUSTERS AEROBIC BOTTLE ONLY CRITICAL VALUE NOTED.  VALUE IS CONSISTENT WITH PREVIOUSLY REPORTED AND CALLED VALUE.    Culture (A)  Final    STAPHYLOCOCCUS AUREUS SUSCEPTIBILITIES PERFORMED ON PREVIOUS CULTURE WITHIN THE LAST 5 DAYS. Performed at [facility], [place] [address], [place], [place] [address]    Report Status 07/30/2018 FINAL  Final  C difficile quick scan w PCR reflex     Status: None   Collection Time: 07/27/18  9:28 PM  Result Value Ref Range Status   C Diff antigen NEGATIVE NEGATIVE Final   C Diff toxin NEGATIVE NEGATIVE Final   C Diff interpretation No C. difficile detected.  Final    Comment: Performed at [facility], [place] [address], [place], [place] [address]  MRSA PCR Screening     Status: None   Collection Time: 07/28/18  1:30 AM  Result Value Ref Range Status   MRSA by PCR NEGATIVE NEGATIVE Final    Comment:        The GeneXpert MRSA Assay (FDA approved for NASAL specimens only), is one component of a comprehensive MRSA colonization surveillance program. It is not intended to diagnose MRSA infection nor to guide or monitor treatment for MRSA infections. Performed at [facility], [place] [address],  [place], [place] [address]   Culture, blood (routine x 2)     Status: None (Preliminary result)   Collection Time: 07/29/18  5:00 PM  Result Value Ref Range Status   Specimen Description BLOOD RIGHT HAND  Final   Special Requests   Final    BOTTLES DRAWN AEROBIC ONLY Blood Culture adequate volume   Culture   Final    NO GROWTH 3 DAYS Performed at [facility], [place] [address], [place], [place] [address]    Report Status PENDING  Incomplete  Culture, blood (routine x 2)     Status: None (Preliminary result)   Collection Time: 07/29/18  5:07 PM  Result  Value Ref Range Status   Specimen Description BLOOD RIGHT FOREARM  Final   Special Requests   Final    BOTTLES DRAWN AEROBIC ONLY Blood Culture adequate volume   Culture   Final    NO GROWTH 3 DAYS Performed at [facility], [address], [place], [place] [address]    Report Status PENDING  Incomplete    Radiology Reports Dg Chest Portable 1 View  Result Date: 07/27/2018 CLINICAL DATA:  Sepsis. Fever and altered mental status. Hypertension and diabetes. Ex-smoker. EXAM: PORTABLE CHEST 1 VIEW COMPARISON:  02/14/2011 FINDINGS: Left axillary vascular stent. Numerous leads and wires project over the chest. Midline trachea. Normal heart size. Atherosclerosis in the transverse aorta. No pleural effusion or pneumothorax. Mild left hemidiaphragm elevation. Clear lungs. Interval but nonacute left rib deformities could be posttraumatic or postsurgical. IMPRESSION: No acute findings. Aortic Atherosclerosis (ICD10-I70.0). Electronically Signed   By: Abigail Miyamoto M.D.   On: 07/27/2018 12:10    Time Spent in minutes  30   Lala Lund M.D on 08/01/2018 at 9:37 AM  To page go to [email or web address] - password [facility]

## 2018-08-01 NOTE — Care Management Note (Signed)
Case Management Note  Patient Details  Name: Roger Vazquez MRN: [id number] Date of Birth: 01/12/1951  Subjective/Objective:    MSSA bacteremia     Hx of end-stage renal disease on hemodialysis.Resides alone.   Roger Vazquez (Sister)     [phone]           PCP: [facility]  Action/Plan:  PT evaluation pending .... NCM following for [facility] needs  Expected Discharge Date:                  Expected Discharge Plan:  Home/Self Care  In-House Referral:     Discharge planning Services  CM Consult  Post Acute Care Choice:    Choice offered to:     DME Arranged:    DME Agency:     HH Arranged:    HH Agency:     Status of Service:  In process, will continue to follow  If discussed at Long Length of Stay Meetings, dates discussed:    Additional Comments:  Sharin Mons, RN 08/01/2018, 3:02 PM

## 2018-08-01 NOTE — Progress Notes (Signed)
Left UE Duplex dialysis access:  Patent fistula upper arm. No surrounding fluid noted.  Landry Mellow, RDMS, RVT

## 2018-08-02 DIAGNOSIS — A412 Sepsis due to unspecified staphylococcus: Secondary | ICD-10-CM | POA: Insufficient documentation

## 2018-08-02 LAB — PTH, INTACT AND CALCIUM
CALCIUM TOTAL (PTH): 7.1 mg/dL — AB (ref 8.6–10.2)
PTH: 102 pg/mL — AB (ref 15–65)

## 2018-08-02 MED ORDER — BLOOD GLUCOSE MONITOR KIT: PACK | 1 refills | Status: DC

## 2018-08-02 MED ORDER — CEFAZOLIN IV (FOR PTA / DISCHARGE USE ONLY): 2.0000 g | INTRAVENOUS | 0 refills | Status: DC

## 2018-08-02 MED ORDER — INSULIN GLARGINE 100 UNIT/ML ~~LOC~~ SOLN: 15.0000 [IU] | Freq: Every day | SUBCUTANEOUS | 0 refills | Status: DC

## 2018-08-02 MED ORDER — "INSULIN SYRINGE-NEEDLE U-100 25G X 1"" 1 ML MISC": 0 refills | Status: DC

## 2018-08-02 MED ORDER — INSULIN ASPART 100 UNIT/ML ~~LOC~~ SOLN: SUBCUTANEOUS | 0 refills | Status: DC

## 2018-08-02 MED ORDER — INSULIN GLARGINE 100 UNIT/ML ~~LOC~~ SOLN
15.0000 [IU] | Freq: Every day | SUBCUTANEOUS | Status: DC
  Administered 2018-08-02: 15 [IU] via SUBCUTANEOUS
  Filled 2018-08-02: qty 0.15

## 2018-08-02 NOTE — Discharge Instructions (Signed)
Follow with Primary MD [facility] in 7 days   Get CBC, CMP checked  by Primary MD in 5-7 days    Activity: As tolerated with Full fall precautions use walker/cane & assistance as needed  Disposition Home    Diet: Renal - low carbohydrate diet with strict 1.5 L/day total fluid restriction.  Accuchecks 4 times/day, Once in AM empty stomach and then before each meal. Log in all results and show them to your Prim.MD in 3 days. If any glucose reading is under 80 or above 300 call your Prim MD immidiately. Follow Low glucose instructions for glucose under 80 as instructed.   Special Instructions: If you have smoked or chewed Tobacco  in the last 2 yrs please stop smoking, stop any regular Alcohol  and or any Recreational drug use.  On your next visit with your primary care physician please Get Medicines reviewed and adjusted.  Please request your Prim.MD to go over all Hospital Tests and Procedure/Radiological results at the follow up, please get all Hospital records sent to your Prim MD by signing hospital release before you go home.  If you experience worsening of your admission symptoms, develop shortness of breath, life threatening emergency, suicidal or homicidal thoughts you must seek medical attention immediately by calling 911 or calling your MD immediately  if symptoms less severe.  You Must read complete instructions/literature along with all the possible adverse reactions/side effects for all the Medicines you take and that have been prescribed to you. Take any new Medicines after you have completely understood and accpet all the possible adverse reactions/side effects.

## 2018-08-02 NOTE — Progress Notes (Signed)
Nsg Discharge Note  Admit Date:  07/27/2018 Discharge date: 08/02/2018   Dena Billet to be D/C'd home per MD order.  AVS completed.  Copy placed in envelope with prescriptions.   Discharge Medication: Allergies as of 08/02/2018   No Known Allergies     Medication List    STOP taking these medications   HYDROcodone-acetaminophen 5-325 MG tablet Commonly known as:  NORCO/VICODIN   methocarbamol 500 MG tablet Commonly known as:  ROBAXIN     TAKE these medications   amLODipine 10 MG tablet Commonly known as:  NORVASC Take 5 mg by mouth daily.   aspirin 81 MG tablet Take 81 mg by mouth daily.   blood glucose meter kit and supplies Kit Dispense based on patient and insurance preference. Use up to four times daily as directed. (FOR ICD-9 250.00, 250.01). For QAC - HS accuchecks.   ceFAZolin  IVPB Commonly known as:  ANCEF Inject 2 g into the vein every Monday, Wednesday, and Friday with hemodialysis for 14 days. Indication:  MSSA bacteremia Last Day of Therapy:  August 14, 2018 Labs - Once weekly:  CBC/D and BMP, Labs - Every other week:  ESR and CRP   hydrALAZINE 25 MG tablet Commonly known as:  APRESOLINE Take 2 tablets (50 mg total) by mouth every 8 (eight) hours.   insulin aspart 100 UNIT/ML injection Commonly known as:  novoLOG Before each meal 3 times a day, 140-199 - 2 units, 200-250 - 4 units, 251-299 - 6 units,  300-349 - 8 units,  350 or above 10 units. Dispense syringes and needles as needed, Ok to switch to PEN if approved. Substitute to any brand approved. DX DM2, Code E11.65   insulin glargine 100 UNIT/ML injection Commonly known as:  LANTUS Inject 0.15 mLs (15 Units total) into the skin daily. Dispense insulin pen if approved, if not dispense as needed syringes and needles for 1 month supply. Can switch to Levemir. Diagnosis E 11.65.   Insulin Syringe-Needle U-100 25G X 1" 1 ML Misc For 4 times a day insulin SQ, 1 month supply. Diagnosis E11.65    multivitamin with minerals Tabs tablet Take 1 tablet by mouth daily.   pantoprazole 40 MG tablet Commonly known as:  PROTONIX Take 1 tablet (40 mg total) by mouth 2 (two) times daily.       Discharge Assessment: Vitals:   08/02/18 0017 08/02/18 0617  BP:  (!) 134/59  Pulse:  84  Resp:  17  Temp: 99.8 F (37.7 C) 99.6 F (37.6 C)  SpO2:  100%   Skin clean, dry and intact without evidence of skin break down, no evidence of skin tears noted. IV catheter discontinued intact. Dressing with slight pressure applied.  D/c Instructions-Education: Discharge instructions given to patient/family with verbalized understanding. D/c education completed with patient/family including follow up instructions, medication list, d/c activities limitations if indicated, with other d/c instructions as indicated by MD - patient able to verbalize understanding, all questions fully answered.  Nephrology to send IVPB orders to dialysis.  Patient instructed to return to ED, call 911, or call MD for any changes in condition.  Patient escorted via [place], and D/C home via private auto.  Ginette Pitman, RN 08/02/2018 10:43 AM

## 2018-08-02 NOTE — Care Management Note (Signed)
Case Management Note  Patient Details  Name: ERMINIO NYGARD MRN: [id number] Date of Birth: 07-15-1951  Subjective/Objective:   Patient for dc today, needing HHRN, HHPT, NCM offered choice to patient, sister at bedside chose [facility] for patient, referral made to [facility] with [facility] for [facility], HHPT, soc will begin 24 to 48 hrs post dc.   Sister states he needs a pcp here , NCM gave patient the [facility] information to assit him with getting a pcp.               Action/Plan: DC home when ready.    Expected Discharge Date:  08/02/18               Expected Discharge Plan:  Dustine Bertini  In-House Referral:     Discharge planning Services  CM Consult  Post Acute Care Choice:  Home Health Choice offered to:  Patient, Sibling  DME Arranged:    DME Agency:     HH Arranged:  RN, PT, Disease Management [place] Agency:  [place]  Status of Service:  Completed, signed off  If discussed at [place] of Stay Meetings, dates discussed:    Additional Comments:  Zenon Mayo, RN 08/02/2018, 9:27 AM

## 2018-08-02 NOTE — Discharge Summary (Signed)
Roger Vazquez FOY:[id number] DOB: 27-Jul-1951 DOA: 07/27/2018  PCP: [facility]  Admit date: 07/27/2018  Discharge date: 08/02/2018  Admitted From: Home   Disposition:  Home   Recommendations for Outpatient Follow-up:   Follow up with PCP in 1-2 weeks  PCP Please obtain BMP/CBC, 2 view CXR in 1week,  (see Discharge instructions)   PCP Please follow up on the following pending results:    Home Health: PT,RN   Equipment/Devices: None  Consultations: Renal, ID, VVS Discharge Condition: Fair   CODE STATUS: Full   Diet Recommendation: Renal, low carbohydrate.  Strict 1.5 L/day total fluid restriction.   Chief Complaint  Patient presents with  . Altered/Sepsis     Brief history of present illness from the day of admission and additional interim summary     HarveyPulliamis a67 y.o.male,with past medical history significant for end-stage renal disease on hemodialysis who was seen good last on Friday, sent to our emergency room for evaluation due to altered mental status and fever. Patient was found covered with stool and had a high fever upon admission, with no obvious source, further work-up showed that he had MSSA bacteremia.                                                                 Hospital Course   1.  Sepsis with MSSA bacteremia.  Source unclear.  Does have a mild cough and C. difficile negative diarrhea, left arm dialysis graft site stable on exam.  Currently on Ancef, ID on board.  Stable TTE and TEE and L. AV Graft [place] was also stable, so far negative repeat surveillance cultures which were drawn on 07/29/2018.  Clinically sepsis physiology seems to have resolved will monitor, ID following, also seen by VVS and Renal.  He has remained stable and will be discharged today home with IV  cefazolin for 10 more days with his dialysis treatments stop date as below, communicated with renal physician Dr. Jonnie Finner.  2.  ESRD.  Dialysis schedule Monday, Wednesday and Friday. Renal on board.  3.  History of cocaine and alcohol abuse.  Says he has quit both for several years.  Will monitor.  4.  History of Hepatitis C.  Outpatient GI/ID follow-up as needed been no acute issues.  5.  Diarrhea.  C. difficile negative.  Resolved with PRN Imodium.  6.  Essential hypertension.  Pressure currently soft, PRN hydralazine only and monitor.  7.  GERD.  On PPI.  8.  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  These have been present for at least one month and are not worsening, outpatient ENT ophthalmology follow-up.    9.  Did have DM type II previously and became noncompliant with his medications for unclear reasons.  Diabetic education provided.  We will go home on insulin.  Lab Results  Component Value Date   HGBA1C 8.2 (H) 07/28/2018    Discharge diagnosis     Principal Problem:   Bacteremia due to methicillin susceptible Staphylococcus aureus (MSSA) Active Problems:   HEPATITIS C   Essential hypertension   Sepsis ([place])   ESRD (end stage renal disease) [facility])    Discharge instructions    Discharge Instructions    Discharge instructions   Complete by:  As directed    Follow with Primary MD [facility] in 7 days   Get CBC, CMP checked  by Primary MD in 5-7 days    Activity: As tolerated with Full fall precautions use walker/cane & assistance as needed  Disposition Home    Diet: Renal - low carbohydrate diet with strict 1.5 L/day total fluid restriction.  Accuchecks 4 times/day, Once in AM empty stomach and then before each meal. Log in all results and show them to your Prim.MD in 3 days. If any glucose reading is under 80 or above 300 call your Prim MD immidiately. Follow Low glucose instructions for glucose under 80 as  instructed.   Special Instructions: If you have smoked or chewed Tobacco  in the last 2 yrs please stop smoking, stop any regular Alcohol  and or any Recreational drug use.  On your next visit with your primary care physician please Get Medicines reviewed and adjusted.  Please request your Prim.MD to go over all Hospital Tests and Procedure/Radiological results at the follow up, please get all Hospital records sent to your Prim MD by signing hospital release before you go home.  If you experience worsening of your admission symptoms, develop shortness of breath, life threatening emergency, suicidal or homicidal thoughts you must seek medical attention immediately by calling 911 or calling your MD immediately  if symptoms less severe.  You Must read complete instructions/literature along with all the possible adverse reactions/side effects for all the Medicines you take and that have been prescribed to you. Take any new Medicines after you have completely understood and accpet all the possible adverse reactions/side effects.   Home infusion instructions [facility] May follow [place] Dosing Protocol; May administer Cathflo as needed to maintain patency of vascular access device.; Flushing of vascular access device: per [facility] Protocol: 0.9% NaCl pre/post medica...   Complete by:  As directed    Instructions:  May follow [place] Dosing Protocol   Instructions:  May administer Cathflo as needed to maintain patency of vascular access device.   Instructions:  Flushing of vascular access device: per [facility] Protocol: 0.9% NaCl pre/post medication administration and prn patency; Heparin 100 u/ml, 50m for implanted ports and Heparin 10u/ml, 521mfor all other central venous catheters.   Instructions:  May follow [facility] Anaphylaxis Protocol for First Dose Administration in the home: 0.9% NaCl at 25-50 ml/hr to maintain IV access for protocol meds. Epinephrine 0.3 ml IV/IM PRN and Benadryl 25-50 IV/IM  PRN s/s of anaphylaxis.   Instructions:  [facility] Coordinator (RN) to assist per patient IV care needs in the home PRN.   Increase activity slowly   Complete by:  As directed       Discharge Medications   Allergies as of 08/02/2018   No Known Allergies     Medication List    STOP taking these medications   HYDROcodone-acetaminophen 5-325 MG tablet Commonly known as:  NORCO/VICODIN   methocarbamol 500 MG tablet Commonly known as:  ROBAXIN     TAKE these medications   amLODipine 10 MG tablet Commonly known as:  NORVASC Take 5 mg by mouth daily.   aspirin 81 MG tablet Take 81 mg by mouth daily.   blood glucose meter kit and supplies Kit Dispense based on patient and insurance preference. Use up to four times daily as directed. (FOR ICD-9 250.00, 250.01). For QAC - HS accuchecks.   ceFAZolin  IVPB Commonly known as:  ANCEF Inject 2 g into the vein every Monday, Wednesday, and Friday with hemodialysis for 14 days. Indication:  MSSA bacteremia Last Day of Therapy:  August 14, 2018 Labs - Once weekly:  CBC/D and BMP, Labs - Every other week:  ESR and CRP   hydrALAZINE 25 MG tablet Commonly known as:  APRESOLINE Take 2 tablets (50 mg total) by mouth every 8 (eight) hours.   insulin aspart 100 UNIT/ML injection Commonly known as:  novoLOG Before each meal 3 times a day, 140-199 - 2 units, 200-250 - 4 units, 251-299 - 6 units,  300-349 - 8 units,  350 or above 10 units. Dispense syringes and needles as needed, Ok to switch to PEN if approved. Substitute to any brand approved. DX DM2, Code E11.65   insulin glargine 100 UNIT/ML injection Commonly known as:  LANTUS Inject 0.15 mLs (15 Units total) into the skin daily. Dispense insulin pen if approved, if not dispense as needed syringes and needles for 1 month supply. Can switch to Levemir. Diagnosis E 11.65.   Insulin Syringe-Needle U-100 25G X 1" 1 ML Misc For 4 times a day insulin SQ, 1 month supply.  Diagnosis E11.65   multivitamin with minerals Tabs tablet Take 1 tablet by mouth daily.   pantoprazole 40 MG tablet Commonly known as:  PROTONIX Take 1 tablet (40 mg total) by mouth 2 (two) times daily.            Home Infusion Instuctions  (From admission, onward)         Start     Ordered   08/02/18 0000  Home infusion instructions [facility] May follow [place] Dosing Protocol; May administer Cathflo as needed to maintain patency of vascular access device.; Flushing of vascular access device: per [facility] Protocol: 0.9% NaCl pre/post medica...    Question Answer Comment  Instructions May follow [place] Dosing Protocol   Instructions May administer Cathflo as needed to maintain patency of vascular access device.   Instructions Flushing of vascular access device: per [facility] Protocol: 0.9% NaCl pre/post medication administration and prn patency; Heparin 100 u/ml, 72m for implanted ports and Heparin 10u/ml, 543mfor all other central venous catheters.   Instructions May follow [facility] Anaphylaxis Protocol for First Dose Administration in the home: 0.9% NaCl at 25-50 ml/hr to maintain IV access for protocol meds. Epinephrine 0.3 ml IV/IM PRN and Benadryl 25-50 IV/IM PRN s/s of anaphylaxis.   Instructions [facility] Infusion Coordinator (RN) to assist per patient IV care needs in the home PRN.      08/02/18 0823          Follow-up Information    [facility] as [facility] as possible for a visit in 1 week(s).   Contact information: [phone]         Major procedures and Radiology Reports - PLEASE review detailed and final reports thoroughly  -        Dg Chest Portable  1 View  Result Date: 07/27/2018 CLINICAL DATA:  Sepsis. Fever and altered mental status. Hypertension and diabetes. Ex-smoker. EXAM: PORTABLE CHEST 1 VIEW COMPARISON:  02/14/2011 FINDINGS: Left  axillary vascular stent. Numerous leads and wires project over the chest. Midline trachea. Normal heart size. Atherosclerosis in the transverse aorta. No pleural effusion or pneumothorax. Mild left hemidiaphragm elevation. Clear lungs. Interval but nonacute left rib deformities could be posttraumatic or postsurgical. IMPRESSION: No acute findings. Aortic Atherosclerosis (ICD10-I70.0). Electronically Signed   By: Abigail Miyamoto M.D.   On: 07/27/2018 12:10    Micro Results    Recent Results (from the past 240 hour(s))  Culture, blood (Routine x 2)     Status: Abnormal   Collection Time: 07/27/18 11:04 AM  Result Value Ref Range Status   Specimen Description BLOOD BLOOD RIGHT FOREARM  Final   Special Requests   Final    BOTTLES DRAWN AEROBIC ONLY Blood Culture adequate volume   Culture  Setup Time   Final    AEROBIC BOTTLE ONLY GRAM POSITIVE COCCI CRITICAL RESULT CALLED TO, READ BACK BY AND VERIFIED WITH: J.LEDFORD,PHARMD AT 0500 ON 07/28/18 BY G.MCADOO Performed at [facility], [place] [address] [place], [place] [address]    Culture STAPHYLOCOCCUS AUREUS (A)  Final   Report Status 07/30/2018 FINAL  Final   Organism ID, Bacteria STAPHYLOCOCCUS AUREUS  Final      Susceptibility   Staphylococcus aureus - MIC*    CIPROFLOXACIN <=0.5 SENSITIVE Sensitive     ERYTHROMYCIN <=0.25 SENSITIVE Sensitive     GENTAMICIN <=0.5 SENSITIVE Sensitive     OXACILLIN <=0.25 SENSITIVE Sensitive     TETRACYCLINE <=1 SENSITIVE Sensitive     VANCOMYCIN <=0.5 SENSITIVE Sensitive     TRIMETH/SULFA <=10 SENSITIVE Sensitive     CLINDAMYCIN <=0.25 SENSITIVE Sensitive     RIFAMPIN <=0.5 SENSITIVE Sensitive     Inducible Clindamycin NEGATIVE Sensitive     * STAPHYLOCOCCUS AUREUS  Blood Culture ID Panel (Reflexed)     Status: Abnormal   Collection Time: 07/27/18 11:04 AM  Result Value Ref Range Status   Enterococcus species NOT DETECTED NOT DETECTED Final   Listeria monocytogenes NOT DETECTED NOT DETECTED  Final   Staphylococcus species DETECTED (A) NOT DETECTED Final    Comment: CRITICAL RESULT CALLED TO, READ BACK BY AND VERIFIED WITH: J.LEDFORD,PHARMD AT 0500 ON 07/28/18 BY G.MCADOO    Staphylococcus aureus (BCID) DETECTED (A) NOT DETECTED Final    Comment: CRITICAL RESULT CALLED TO, READ BACK BY AND VERIFIED WITH: J.LEDFORD,PHARMD AT 0500 ON 07/28/18 BY G.MCADOO Methicillin (oxacillin) susceptible Staphylococcus aureus (MSSA). Preferred therapy is anti staphylococcal beta lactam antibiotic (Cefazolin or Nafcillin), unless clinically contraindicated.    Methicillin resistance NOT DETECTED NOT DETECTED Final   Streptococcus species NOT DETECTED NOT DETECTED Final   Streptococcus agalactiae NOT DETECTED NOT DETECTED Final   Streptococcus pneumoniae NOT DETECTED NOT DETECTED Final   Streptococcus pyogenes NOT DETECTED NOT DETECTED Final   Acinetobacter baumannii NOT DETECTED NOT DETECTED Final   Enterobacteriaceae species NOT DETECTED NOT DETECTED Final   Enterobacter cloacae complex NOT DETECTED NOT DETECTED Final   Escherichia coli NOT DETECTED NOT DETECTED Final   Klebsiella oxytoca NOT DETECTED NOT DETECTED Final   Klebsiella pneumoniae NOT DETECTED NOT DETECTED Final   Proteus species NOT DETECTED NOT DETECTED Final   Serratia marcescens NOT DETECTED NOT DETECTED Final   Haemophilus influenzae NOT DETECTED NOT DETECTED Final   Neisseria meningitidis NOT DETECTED NOT DETECTED Final   Pseudomonas  aeruginosa NOT DETECTED NOT DETECTED Final   Candida albicans NOT DETECTED NOT DETECTED Final   Candida glabrata NOT DETECTED NOT DETECTED Final   Candida krusei NOT DETECTED NOT DETECTED Final   Candida parapsilosis NOT DETECTED NOT DETECTED Final   Candida tropicalis NOT DETECTED NOT DETECTED Final    Comment: Performed at [facility], [place] [address], [place], [place] [address]  Culture, blood (Routine x 2)     Status: Abnormal   Collection Time: 07/27/18 11:11 AM  Result Value  Ref Range Status   Specimen Description BLOOD BLOOD RIGHT ARM  Final   Special Requests   Final    BOTTLES DRAWN AEROBIC ONLY Blood Culture results may not be optimal due to an inadequate volume of blood received in culture bottles   Culture  Setup Time   Final    GRAM POSITIVE COCCI IN CLUSTERS AEROBIC BOTTLE ONLY CRITICAL VALUE NOTED.  VALUE IS CONSISTENT WITH PREVIOUSLY REPORTED AND CALLED VALUE.    Culture (A)  Final    STAPHYLOCOCCUS AUREUS SUSCEPTIBILITIES PERFORMED ON PREVIOUS CULTURE WITHIN THE LAST 5 DAYS. Performed at [facility], [place] [address] [place], [place] [address]    Report Status 07/30/2018 FINAL  Final  C difficile quick scan w PCR reflex     Status: None   Collection Time: 07/27/18  9:28 PM  Result Value Ref Range Status   C Diff antigen NEGATIVE NEGATIVE Final   C Diff toxin NEGATIVE NEGATIVE Final   C Diff interpretation No C. difficile detected.  Final    Comment: Performed at [facility], [place] [address] [place], [place] [address]  MRSA PCR Screening     Status: None   Collection Time: 07/28/18  1:30 AM  Result Value Ref Range Status   MRSA by PCR NEGATIVE NEGATIVE Final    Comment:        The GeneXpert MRSA Assay (FDA approved for NASAL specimens only), is one component of a comprehensive MRSA colonization surveillance program. It is not intended to diagnose MRSA infection nor to guide or monitor treatment for MRSA infections. Performed at [facility], [place] [address], [place], [place] [address]   Culture, blood (routine x 2)     Status: None (Preliminary result)   Collection Time: 07/29/18  5:00 PM  Result Value Ref Range Status   Specimen Description BLOOD RIGHT HAND  Final   Special Requests   Final    BOTTLES DRAWN AEROBIC ONLY Blood Culture adequate volume   Culture   Final    NO GROWTH 3 DAYS Performed at [facility], [place] [address] [place], [place] [address]    Report Status PENDING  Incomplete  Culture,  blood (routine x 2)     Status: None (Preliminary result)   Collection Time: 07/29/18  5:07 PM  Result Value Ref Range Status   Specimen Description BLOOD RIGHT FOREARM  Final   Special Requests   Final    BOTTLES DRAWN AEROBIC ONLY Blood Culture adequate volume   Culture   Final    NO GROWTH 3 DAYS Performed at [facility], [address], [place],  [address]    Report Status PENDING  Incomplete    Today   Subjective    Roger Vazquez today has no headache,no chest abdominal pain,no new weakness tingling or numbness, feels much better wants to go home today.    Objective   Blood pressure (!) 134/59,  pulse 84, temperature 99.6 F (37.6 C), resp. rate 17, height 5' 6"  (1.676 m), weight 58.9 kg, SpO2 100 %.   Intake/Output Summary (Last 24 hours) at 08/02/2018 0829 Last data filed at 08/01/2018 1321 Gross per 24 hour  Intake -  Output 1300 ml  Net -1300 ml    Exam Awake Alert, Oriented x 3, No new F.N deficits, Normal affect [place].AT,PERRAL Supple Neck,No JVD, No cervical lymphadenopathy appriciated.  Symmetrical Chest wall movement, Good air movement bilaterally, CTAB RRR,No Gallops,Rubs or new Murmurs, No Parasternal Heave +ve B.Sounds, Abd Soft, Non tender, No organomegaly appriciated, No rebound -guarding or rigidity. No Cyanosis, Clubbing or edema, No new Rash or bruise, arm AV graft site appears stable   Data Review   CBC w Diff:  Lab Results  Component Value Date   WBC 7.5 08/01/2018   HGB 7.6 (L) 08/01/2018   HCT 23.6 (L) 08/01/2018   PLT 123 (L) 08/01/2018   LYMPHOPCT 6 07/27/2018   MONOPCT 3 07/27/2018   EOSPCT 0 07/27/2018   BASOPCT 0 07/27/2018    CMP:  Lab Results  Component Value Date   NA 129 (L) 08/01/2018   K 2.8 (L) 08/01/2018   CL 95 (L) 08/01/2018   CO2 20 (L) 08/01/2018   BUN 47 (H) 08/01/2018   CREATININE 8.51 (H) 08/01/2018   PROT 7.2 07/27/2018   ALBUMIN 2.4 (L) 08/01/2018   BILITOT 0.8 07/27/2018   ALKPHOS 66  07/27/2018   AST 42 (H) 07/27/2018   ALT 22 07/27/2018  .   Total Time in preparing paper work, data evaluation and todays exam - 48 minutes  Lala Lund M.D on 08/02/2018 at 8:29 AM  [facility]   Office  [phone]

## 2018-08-03 LAB — CULTURE, BLOOD (ROUTINE X 2)
CULTURE: NO GROWTH
CULTURE: NO GROWTH
SPECIAL REQUESTS: ADEQUATE
Special Requests: ADEQUATE

## 2018-08-05 ENCOUNTER — Encounter (HOSPITAL_COMMUNITY): Payer: Self-pay | Admitting: Emergency Medicine

## 2018-08-05 ENCOUNTER — Other Ambulatory Visit: Payer: Self-pay

## 2018-08-05 ENCOUNTER — Inpatient Hospital Stay (HOSPITAL_COMMUNITY)
Admission: EM | Admit: 2018-08-05 | Discharge: 2018-08-11 | DRG: 264 | Disposition: A | Discharge status: Home Health | Payer: Medicare PPO | Attending: Internal Medicine | Admitting: Internal Medicine

## 2018-08-05 ENCOUNTER — Inpatient Hospital Stay (HOSPITAL_COMMUNITY): Payer: Medicare PPO

## 2018-08-05 DIAGNOSIS — B9561 Methicillin susceptible Staphylococcus aureus infection as the cause of diseases classified elsewhere: Secondary | ICD-10-CM | POA: Diagnosis not present

## 2018-08-05 DIAGNOSIS — E1122 Type 2 diabetes mellitus with diabetic chronic kidney disease: Secondary | ICD-10-CM | POA: Diagnosis present

## 2018-08-05 DIAGNOSIS — Z66 Do not resuscitate: Secondary | ICD-10-CM | POA: Diagnosis present

## 2018-08-05 DIAGNOSIS — Y832 Surgical operation with anastomosis, bypass or graft as the cause of abnormal reaction of the patient, or of later complication, without mention of misadventure at the time of the procedure: Secondary | ICD-10-CM | POA: Diagnosis present

## 2018-08-05 DIAGNOSIS — D631 Anemia in chronic kidney disease: Secondary | ICD-10-CM | POA: Diagnosis present

## 2018-08-05 DIAGNOSIS — R7881 Bacteremia: Secondary | ICD-10-CM | POA: Diagnosis present

## 2018-08-05 DIAGNOSIS — I120 Hypertensive chronic kidney disease with stage 5 chronic kidney disease or end stage renal disease: Secondary | ICD-10-CM | POA: Diagnosis present

## 2018-08-05 DIAGNOSIS — K219 Gastro-esophageal reflux disease without esophagitis: Secondary | ICD-10-CM

## 2018-08-05 DIAGNOSIS — N185 Chronic kidney disease, stage 5: Secondary | ICD-10-CM | POA: Diagnosis not present

## 2018-08-05 DIAGNOSIS — R509 Fever, unspecified: Secondary | ICD-10-CM | POA: Diagnosis not present

## 2018-08-05 DIAGNOSIS — Z466 Encounter for fitting and adjustment of urinary device: Secondary | ICD-10-CM

## 2018-08-05 DIAGNOSIS — Z87891 Personal history of nicotine dependence: Secondary | ICD-10-CM | POA: Diagnosis not present

## 2018-08-05 DIAGNOSIS — N186 End stage renal disease: Secondary | ICD-10-CM | POA: Diagnosis present

## 2018-08-05 DIAGNOSIS — Z794 Long term (current) use of insulin: Secondary | ICD-10-CM | POA: Diagnosis not present

## 2018-08-05 DIAGNOSIS — N2581 Secondary hyperparathyroidism of renal origin: Secondary | ICD-10-CM | POA: Diagnosis present

## 2018-08-05 DIAGNOSIS — T827XXA Infection and inflammatory reaction due to other cardiac and vascular devices, implants and grafts, initial encounter: Principal | ICD-10-CM | POA: Diagnosis present

## 2018-08-05 DIAGNOSIS — E876 Hypokalemia: Secondary | ICD-10-CM | POA: Diagnosis present

## 2018-08-05 DIAGNOSIS — Z7982 Long term (current) use of aspirin: Secondary | ICD-10-CM | POA: Diagnosis not present

## 2018-08-05 DIAGNOSIS — Z992 Dependence on renal dialysis: Secondary | ICD-10-CM

## 2018-08-05 DIAGNOSIS — R066 Hiccough: Secondary | ICD-10-CM | POA: Diagnosis not present

## 2018-08-05 DIAGNOSIS — I10 Essential (primary) hypertension: Secondary | ICD-10-CM

## 2018-08-05 DIAGNOSIS — Z95828 Presence of other vascular implants and grafts: Secondary | ICD-10-CM

## 2018-08-05 DIAGNOSIS — D649 Anemia, unspecified: Secondary | ICD-10-CM

## 2018-08-05 LAB — COMPREHENSIVE METABOLIC PANEL
ALT: <5 U/L (ref 0–44)
AST: 33 U/L (ref 15–41)
Albumin: 2.4 g/dL — ABNORMAL LOW (ref 3.5–5.0)
Alkaline Phosphatase: 69 U/L (ref 38–126)
Anion gap: 12 (ref 5–15)
BILIRUBIN TOTAL: 0.4 mg/dL (ref 0.3–1.2)
BUN: 31 mg/dL — AB (ref 8–23)
CHLORIDE: 94 mmol/L — AB (ref 98–111)
CO2: 26 mmol/L (ref 22–32)
Calcium: 7.2 mg/dL — ABNORMAL LOW (ref 8.9–10.3)
Creatinine, Ser: 9.33 mg/dL — ABNORMAL HIGH (ref 0.61–1.24)
GFR calc Af Amer: 6 mL/min — ABNORMAL LOW (ref >60)
GFR calc non Af Amer: 5 mL/min — ABNORMAL LOW (ref >60)
Glucose, Bld: 320 mg/dL — ABNORMAL HIGH (ref 70–99)
POTASSIUM: 3.2 mmol/L — AB (ref 3.5–5.1)
Sodium: 132 mmol/L — ABNORMAL LOW (ref 135–145)
Total Protein: 6.6 g/dL (ref 6.5–8.1)

## 2018-08-05 LAB — CBC WITH DIFFERENTIAL/PLATELET
ABS IMMATURE GRANULOCYTES: 0.06 10*3/uL (ref 0.00–0.07)
Basophils Absolute: 0 10*3/uL (ref 0.0–0.1)
Basophils Relative: 0 %
Eosinophils Absolute: 0 10*3/uL (ref 0.0–0.5)
Eosinophils Relative: 1 %
HCT: 20.8 % — ABNORMAL LOW (ref 39.0–52.0)
HEMOGLOBIN: 6.8 g/dL — AB (ref 13.0–17.0)
Immature Granulocytes: 1 %
LYMPHS ABS: 1.2 10*3/uL (ref 0.7–4.0)
LYMPHS PCT: 15 %
MCH: 34.3 pg — AB (ref 26.0–34.0)
MCHC: 32.7 g/dL (ref 30.0–36.0)
MCV: 105.1 fL — ABNORMAL HIGH (ref 80.0–100.0)
MONO ABS: 0.8 10*3/uL (ref 0.1–1.0)
Monocytes Relative: 11 %
NEUTROS ABS: 5.4 10*3/uL (ref 1.7–7.7)
Neutrophils Relative %: 72 %
Platelets: 165 10*3/uL (ref 150–400)
RBC: 1.98 MIL/uL — ABNORMAL LOW (ref 4.22–5.81)
RDW: 14.6 % (ref 11.5–15.5)
WBC: 7.5 10*3/uL (ref 4.0–10.5)
nRBC: 0 % (ref 0.0–0.2)

## 2018-08-05 LAB — I-STAT CG4 LACTIC ACID, ED: LACTIC ACID, VENOUS: 1.56 mmol/L (ref 0.5–1.9)

## 2018-08-05 MED ORDER — FENTANYL CITRATE (PF) 100 MCG/2ML IJ SOLN: 50.0000 ug | Freq: Once | INTRAMUSCULAR | Status: DC

## 2018-08-05 MED ORDER — SODIUM CHLORIDE 0.9 % IV SOLN
1.0000 g | Freq: Once | INTRAVENOUS | Status: AC
  Administered 2018-08-05: 1 g via INTRAVENOUS
  Filled 2018-08-05: qty 1

## 2018-08-05 MED ORDER — SODIUM CHLORIDE 0.9% IV SOLUTION
Freq: Once | INTRAVENOUS | Status: AC
  Administered 2018-08-06: 10:00:00 via INTRAVENOUS

## 2018-08-05 MED ORDER — HEPARIN SODIUM (PORCINE) 5000 UNIT/ML IJ SOLN
5000.0000 [IU] | Freq: Three times a day (TID) | INTRAMUSCULAR | Status: DC
  Administered 2018-08-06: 5000 [IU] via SUBCUTANEOUS

## 2018-08-05 MED ORDER — LACTATED RINGERS IV BOLUS: 500.0000 mL | Freq: Once | INTRAVENOUS | Status: DC

## 2018-08-05 MED ORDER — SODIUM CHLORIDE 0.9 % IV SOLN
2.0000 g | INTRAVENOUS | Status: DC
  Filled 2018-08-05: qty 2

## 2018-08-05 MED ORDER — VANCOMYCIN HCL 10 G IV SOLR: 20.0000 mg/kg | Freq: Once | INTRAVENOUS | Status: DC

## 2018-08-05 MED ORDER — VANCOMYCIN HCL 10 G IV SOLR
1250.0000 mg | Freq: Once | INTRAVENOUS | Status: AC
  Administered 2018-08-05: 1250 mg via INTRAVENOUS
  Filled 2018-08-05: qty 1250

## 2018-08-05 MED ORDER — ADULT MULTIVITAMIN W/MINERALS CH
1.0000 | ORAL_TABLET | Freq: Every day | ORAL | Status: DC
  Administered 2018-08-06 – 2018-08-11 (×6): 1 via ORAL
  Filled 2018-08-05 (×6): qty 1

## 2018-08-05 MED ORDER — PANTOPRAZOLE SODIUM 40 MG PO TBEC
40.0000 mg | DELAYED_RELEASE_TABLET | Freq: Two times a day (BID) | ORAL | Status: DC
  Administered 2018-08-06 – 2018-08-11 (×12): 40 mg via ORAL
  Filled 2018-08-05 (×12): qty 1

## 2018-08-05 MED ORDER — INSULIN ASPART 100 UNIT/ML ~~LOC~~ SOLN
0.0000 [IU] | Freq: Three times a day (TID) | SUBCUTANEOUS | Status: DC
  Administered 2018-08-06: 2 [IU] via SUBCUTANEOUS
  Administered 2018-08-06: 3 [IU] via SUBCUTANEOUS
  Administered 2018-08-07: 2 [IU] via SUBCUTANEOUS
  Administered 2018-08-07 – 2018-08-09 (×4): 1 [IU] via SUBCUTANEOUS
  Administered 2018-08-10: 3 [IU] via SUBCUTANEOUS
  Administered 2018-08-11: 2 [IU] via SUBCUTANEOUS
  Administered 2018-08-11: 3 [IU] via SUBCUTANEOUS

## 2018-08-05 MED ORDER — POTASSIUM CHLORIDE CRYS ER 20 MEQ PO TBCR
40.0000 meq | EXTENDED_RELEASE_TABLET | Freq: Once | ORAL | Status: AC
  Administered 2018-08-06: 40 meq via ORAL
  Filled 2018-08-05: qty 2

## 2018-08-05 MED ORDER — FENTANYL CITRATE (PF) 100 MCG/2ML IJ SOLN
50.0000 ug | Freq: Once | INTRAMUSCULAR | Status: AC
  Administered 2018-08-06: 50 ug via INTRAVENOUS
  Filled 2018-08-05: qty 2

## 2018-08-05 MED ORDER — VANCOMYCIN HCL IN DEXTROSE 500-5 MG/100ML-% IV SOLN
500.0000 mg | INTRAVENOUS | Status: DC
  Filled 2018-08-05 (×2): qty 100

## 2018-08-05 MED ORDER — ASPIRIN 81 MG PO CHEW
81.0000 mg | CHEWABLE_TABLET | Freq: Every day | ORAL | Status: DC
  Administered 2018-08-06 – 2018-08-11 (×6): 81 mg via ORAL
  Filled 2018-08-05 (×6): qty 1

## 2018-08-05 MED ORDER — INSULIN GLARGINE 100 UNIT/ML ~~LOC~~ SOLN
15.0000 [IU] | Freq: Every day | SUBCUTANEOUS | Status: DC
  Administered 2018-08-06 – 2018-08-11 (×6): 15 [IU] via SUBCUTANEOUS
  Filled 2018-08-05 (×7): qty 0.15

## 2018-08-05 MED ORDER — FENTANYL CITRATE (PF) 100 MCG/2ML IJ SOLN
12.5000 ug | INTRAMUSCULAR | Status: DC | PRN
  Administered 2018-08-06: 12.5 ug via INTRAVENOUS
  Filled 2018-08-05: qty 2

## 2018-08-05 NOTE — ED Notes (Signed)
IV team at bedside 

## 2018-08-05 NOTE — ED Triage Notes (Addendum)
Pt reports that a nurse came to their house and said that pts dialysis access was infected.  Port is warm and appears swollen. Pt does have a fever 100.9 (102 at noon) and "just does not feel well."  Last dialysis was Monday.

## 2018-08-05 NOTE — ED Notes (Signed)
Advised Dr Dayna Barker RN, Phlebotomy and IV team have multiple attempts at blood draw.  Unsuccessful.  Dr. Dayna Barker advised to leave stuff needed on bedside table with ultrasound.

## 2018-08-05 NOTE — ED Notes (Signed)
Phlebotomy at bedside.

## 2018-08-05 NOTE — Progress Notes (Signed)
Received report from [employer]. RN made aware that there's no bed yet in room 5M06 where the patient is going.[facility]S was notified earlier prior to patient being pended. Stat clean was placed,bed placement also aware. 23:10 ED RN made aware room is ready. Millan Legan, Wonda Cheng, Therapist, sports

## 2018-08-05 NOTE — ED Notes (Signed)
Dr Dayna Barker at bedside to collect blood start ultrasound IV

## 2018-08-05 NOTE — ED Provider Notes (Signed)
Emergency Department Provider Note   I have reviewed the triage vital signs and the nursing notes.   HISTORY  Chief Complaint Fever and Vascular Access Problem   HPI Roger Vazquez is a 67 y.o. male with multiple medical problems documented below that was just discharged in hospital a couple days ago secondary to sepsis and bacteremia on IV antibiotics who has worsening pain and swelling over his left AV graft.  Also known to [place] to have worsening weakness and generalized fatigue.  No nausea or vomiting.  No cough.  No diarrhea or constipation.  Home health nurse looked at the fistula site and thought that it was much more swollen than before and the family agrees.  Had dialysis yesterday and did fine he is on Monday Wednesday Friday dialysis person.  No melena or blood in his stools. No other associated or modifying symptoms.    Past Medical History:  Diagnosis Date  . Diabetes mellitus   . Hypertension   . Renal disorder     Patient Active Problem List   Diagnosis Date Noted  . Infection of AV graft for dialysis ([place]) 08/05/2018  . Symptomatic anemia 08/05/2018  . Hypokalemia 08/05/2018  . GERD (gastroesophageal reflux disease) 08/05/2018  . ESRD (end stage renal disease) ([place])   . Bacteremia due to methicillin susceptible Staphylococcus aureus (MSSA) 07/28/2018  . Sepsis ([place]) 07/27/2018  . Abdominal pain, other specified site 10/07/2011  . Nausea and vomiting 10/07/2011  . VITAMIN D DEFICIENCY 03/19/2008  . FRACTURE, TIBIA 02/09/2008  . HTN (hypertension) 11/25/2007  . HEPATITIS C 05/27/2007  . Type 2 diabetes mellitus with chronic kidney disease on chronic dialysis ([place]) 05/27/2007  . ERECTILE DYSFUNCTION 05/27/2007  . ABUSE, ALCOHOL, IN REMISSION 05/27/2007  . ABUSE, OPIOID, IN REMISSION 05/27/2007  . ABUSE, COCAINE, IN REMISSION 05/27/2007    Past Surgical History:  Procedure Laterality Date  . TEE WITHOUT CARDIOVERSION N/A 07/31/2018   Procedure:  TRANSESOPHAGEAL ECHOCARDIOGRAM (TEE);  Surgeon: Pixie Casino, MD;  Location: [facility] ENDOSCOPY;  Service: Cardiovascular;  Laterality: N/A;    Current Outpatient Rx  . Order #: [id number] Class: Historical Med  . Order #: [id number] Class: Historical Med  . Order #: [id number] Class: Print  . Order #: [id number] Class: Print  . Order #: [id number] Class: Print  . Order #: [id number] Class: Print  . Order #: [id number] Class: Print  . Order #: [id number] Class: Print  . Order #: [id number] Class: Print  . Order #: [id number] Class: Print    Allergies Patient has no known allergies.  No family history on file.  Social History Social History   Tobacco Use  . Smoking status: Former Smoker    Packs/day: 0.50  . Smokeless tobacco: Former Systems developer    Quit date: 08/01/2015  Substance Use Topics  . Alcohol use: Yes    Alcohol/week: 9.0 standard drinks    Types: 9 Cans of beer per week  . Drug use: Yes    Types: Marijuana    Review of Systems  All other systems negative except as documented in the HPI. All pertinent positives and negatives as reviewed in the HPI. ____________________________________________   PHYSICAL EXAM:  VITAL SIGNS: ED Triage Vitals  Enc Vitals Group     BP 08/05/18 1917 (!) 126/52     Pulse Rate 08/05/18 1917 97     Resp 08/05/18 1917 16     Temp 08/05/18 1917 (!) 100.5 F (38.1 C)     Temp Source 08/05/18 1917 Oral     SpO2 08/05/18  1917 100 %     Weight 08/05/18 2128 129 lb 13.6 oz (58.9 kg)     Height 08/05/18 1918 5\' 6"  (1.676 m)    Constitutional: Alert and oriented. Well appearing and in no acute distress. Eyes: Conjunctivae are normal. PERRL. EOMI. Head: Atraumatic. Nose: No congestion/rhinnorhea. Mouth/Throat: Mucous membranes are moist.  Oropharynx non-erythematous. Neck: No stridor.  No meningeal signs.   Cardiovascular: Normal rate, regular rhythm. Good peripheral circulation. Grossly normal heart sounds.   Respiratory: Normal respiratory effort.  No  retractions. Lungs CTAB. Gastrointestinal: Soft and nontender. No distention.  Musculoskeletal: No lower extremity tenderness nor edema. No gross deformities of extremities. Neurologic:  Normal speech and language. No gross focal neurologic deficits are appreciated.  Skin:  Skin is warm, dry and intact. No rash noted.   ____________________________________________   LABS (all labs ordered are listed, but only abnormal results are displayed)  Labs Reviewed  COMPREHENSIVE METABOLIC PANEL - Abnormal; Notable for the following components:      Result Value   Sodium 132 (*)    Potassium 3.2 (*)    Chloride 94 (*)    Glucose, Bld 320 (*)    BUN 31 (*)    Creatinine, Ser 9.33 (*)    Calcium 7.2 (*)    Albumin 2.4 (*)    GFR calc non Af Amer 5 (*)    GFR calc Af Amer 6 (*)    All other components within normal limits  CBC WITH DIFFERENTIAL/PLATELET - Abnormal; Notable for the following components:   RBC 1.98 (*)    Hemoglobin 6.8 (*)    HCT 20.8 (*)    MCV 105.1 (*)    MCH 34.3 (*)    All other components within normal limits  CULTURE, BLOOD (ROUTINE X 2)  CULTURE, BLOOD (ROUTINE X 2)  URINALYSIS, ROUTINE W REFLEX MICROSCOPIC  VITAMIN B12  FOLATE  IRON AND TIBC  FERRITIN  RETICULOCYTES  SAVE SMEAR (SSMR)  MAGNESIUM  CBC  RENAL FUNCTION PANEL  HEMOGLOBIN A1C  I-STAT CG4 LACTIC ACID, ED  TYPE AND SCREEN  PREPARE RBC (CROSSMATCH)   ____________________________________________  EKG   EKG Interpretation  Date/Time:    Ventricular Rate:    PR Interval:    QRS Duration:   QT Interval:    QTC Calculation:   R Axis:     Text Interpretation:         ____________________________________________  RADIOLOGY  No results found.  ____________________________________________   PROCEDURES  Procedure(s) performed:   Procedures  CRITICAL CARE Performed by: Merrily Pew Total critical care time: 35 minutes Critical care time was exclusive of separately  billable procedures and treating other patients. Critical care was necessary to treat or prevent imminent or life-threatening deterioration. Critical care was time spent personally by me on the following activities: development of treatment plan with patient and/or surrogate as well as nursing, discussions with consultants, evaluation of patient's response to treatment, examination of patient, obtaining history from patient or surrogate, ordering and performing treatments and interventions, ordering and review of laboratory studies, ordering and review of radiographic studies, pulse oximetry and re-evaluation of patient's condition.  ____________________________________________   INITIAL IMPRESSION / ASSESSMENT AND PLAN / ED COURSE  Patient has a likely infection over his dialysis site with lower extreme swelling as well.  Started on antibiotics for this and his fever.  Also found to have anemia.  On review of the records it appears that he is slowly dropped from 11.9 at the beginning  of his hospitalization recently to 7.8 at discharge and now is at 6.8.  Also has some thrombus cytopenia and fever so could also be TTP.  Work-up done.  Plan for admission. Transfusion ordered.   Pertinent labs & imaging results that were available during my care of the patient were reviewed by me and considered in my medical decision making (see chart for details).  ____________________________________________  FINAL CLINICAL IMPRESSION(S) / ED DIAGNOSES  Final diagnoses:  Anemia, unspecified type  Fever, unspecified fever cause  Infection of AV graft for dialysis ([place])     MEDICATIONS GIVEN DURING THIS VISIT:  Medications  0.9 %  sodium chloride infusion (Manually program via Guardrails IV Fluids) (has no administration in time range)  ceFEPIme (MAXIPIME) 1 g in sodium chloride 0.9 % 100 mL IVPB (1 g Intravenous New Bag/Given 08/05/18 2332)  vancomycin (VANCOCIN) 1,250 mg in sodium chloride 0.9 % 250 mL  IVPB (has no administration in time range)  ceFEPIme (MAXIPIME) 2 g in sodium chloride 0.9 % 100 mL IVPB (has no administration in time range)  vancomycin (VANCOCIN) IVPB 500 mg/100 ml premix (has no administration in time range)  fentaNYL (SUBLIMAZE) injection 12.5 mcg (has no administration in time range)  aspirin chewable tablet 81 mg (has no administration in time range)  insulin glargine (LANTUS) injection 15 Units (has no administration in time range)  pantoprazole (PROTONIX) EC tablet 40 mg (has no administration in time range)  multivitamin with minerals tablet 1 tablet (has no administration in time range)  heparin injection 5,000 Units (has no administration in time range)  potassium chloride SA (K-DUR,KLOR-CON) CR tablet 40 mEq (has no administration in time range)  insulin aspart (novoLOG) injection 0-9 Units (has no administration in time range)  fentaNYL (SUBLIMAZE) injection 50 mcg (has no administration in time range)     NEW OUTPATIENT MEDICATIONS STARTED DURING THIS VISIT:  New Prescriptions   No medications on file    Note:  This note was prepared with assistance of Dragon voice recognition software. Occasional wrong-word or sound-a-like substitutions may have occurred due to the inherent limitations of voice recognition software.   Merrily Pew, MD 08/05/18 [phone]

## 2018-08-05 NOTE — ED Notes (Signed)
IV attempted x2

## 2018-08-05 NOTE — H&P (Addendum)
History and Physical    Roger Vazquez IPJ:[id number] DOB: 1951-06-01 DOA: 08/05/2018  PCP: [facility] Patient coming from: Home  Chief Complaint: L arm swelling   HPI: Roger Vazquez is a 67 y.o. male with medical history significant of end-stage renal disease on MWF hemodialysis, hypertension, diabetes presenting to the hospital for further evaluation of left arm swelling at the site of his AV graft.  Family states patient was discharged from the hospital on Saturday.  He then went to dialysis on Monday (yesterday) and it was noted that his left arm was swollen.  As such, patient was advised to come into the hospital for further evaluation.  He goes for hemodialysis to the [facility]  Patient has had a poor appetite since yesterday and had a fever of 102 F at home today.  He has been feeling weak. Denies having any nausea or vomiting.  He is complaining of pain in his left arm at the site of the AV graft.  Patient was admitted from 10/13-10/19 for sepsis with MSSA bacteremia, unclear source.  C. difficile panel negative for diarrhea.  Left arm dialysis graft site was stable on exam.  He was treated with Ancef and ID was on board.  He had stable TTE and TEE.  Left AV graft ultrasound was also stable.  He was discharged with IV cefazolin for 10 more days of his dialysis treatments (stop date October 31).   ED Course: Temperature 100.5 F.  Remainder of vitals stable.  No leukocytosis.  Lactic acid normal.  Hemoglobin 6.8; was 7.6 on discharge 4 days ago.  Chest x-ray showing no acute finding.  2 units of packed red blood cells ordered in the ED.  TRH patient admitted.  Review of Systems: As per HPI otherwise 10 point review of systems negative.  Past Medical History:  Diagnosis Date  . Diabetes mellitus   . Hypertension   . Renal disorder     Past Surgical History:  Procedure Laterality Date  . TEE WITHOUT CARDIOVERSION N/A 07/31/2018   Procedure: TRANSESOPHAGEAL  ECHOCARDIOGRAM (TEE);  Surgeon: Pixie Casino, MD;  Location: [facility] ENDOSCOPY;  Service: Cardiovascular;  Laterality: N/A;     reports that he has quit smoking. He smoked 0.50 packs per day. He quit smokeless tobacco use about 3 years ago. He reports that he drinks about 9.0 standard drinks of alcohol per week. He reports that he has current or past drug history. Drug: Marijuana.  No Known Allergies  No family history on file.  Prior to Admission medications   Medication Sig Start Date End Date Taking? Authorizing Provider  amLODipine (NORVASC) 10 MG tablet Take 5 mg by mouth daily.     Yes [provider]  aspirin 81 MG tablet Take 81 mg by mouth daily.     Yes [provider]  ceFAZolin (ANCEF) IVPB Inject 2 g into the vein every Monday, Wednesday, and Friday with hemodialysis for 14 days. Indication:  MSSA bacteremia Last Day of Therapy:  August 14, 2018 Labs - Once weekly:  CBC/D and BMP, Labs - Every other week:  ESR and CRP 08/02/18 08/16/18 Yes Thurnell Lose, MD  hydrALAZINE (APRESOLINE) 25 MG tablet Take 2 tablets (50 mg total) by mouth every 8 (eight) hours. 10/12/11 08/05/18 Yes Robbie Lis, MD  insulin aspart (NOVOLOG) 100 UNIT/ML injection Before each meal 3 times a day, 140-199 - 2 units, 200-250 - 4 units, 251-299 - 6 units,  300-349 - 8  units,  350 or above 10 units. Dispense syringes and needles as needed, Ok to switch to PEN if approved. Substitute to any brand approved. DX DM2, Code E11.65 08/02/18  Yes Thurnell Lose, MD  insulin glargine (LANTUS) 100 UNIT/ML injection Inject 0.15 mLs (15 Units total) into the skin daily. Dispense insulin pen if approved, if not dispense as needed syringes and needles for 1 month supply. Can switch to Levemir. Diagnosis E 11.65. 08/02/18  Yes Thurnell Lose, MD  Multiple Vitamin (MULITIVITAMIN WITH MINERALS) TABS Take 1 tablet by mouth daily. 10/12/11  Yes Robbie Lis, MD  pantoprazole (PROTONIX) 40 MG tablet  Take 1 tablet (40 mg total) by mouth 2 (two) times daily. 10/12/11 08/05/18 Yes Robbie Lis, MD  blood glucose meter kit and supplies KIT Dispense based on patient and insurance preference. Use up to four times daily as directed. (FOR ICD-9 250.00, 250.01). For QAC - HS accuchecks. 08/02/18   Thurnell Lose, MD  Insulin Syringe-Needle U-100 25G X 1" 1 ML MISC For 4 times a day insulin SQ, 1 month supply. Diagnosis E11.65 08/02/18   Thurnell Lose, MD    Physical Exam: Vitals:   08/05/18 2015 08/05/18 2030 08/05/18 2100 08/05/18 2128  BP:  131/63 137/60   Pulse:  92 95   Resp:  18 (!) 27   Temp: 99 F (37.2 C)     TempSrc: Oral     SpO2:  100% 100%   Weight:    58.9 kg  Height:    5' 6"  (1.676 m)   Physical Exam  Constitutional:  Distressed due to pain in his L arm  HENT:  Head: Normocephalic and atraumatic.  Mouth/Throat: Oropharynx is clear and moist.  Eyes: Right eye exhibits no discharge. Left eye exhibits no discharge.  Cardiovascular: Normal rate, regular rhythm and intact distal pulses.  Pulmonary/Chest: Effort normal and breath sounds normal. No respiratory distress. He has no wheezes. He has no rales.  Abdominal: Soft. Bowel sounds are normal. He exhibits no distension. There is no tenderness.  Musculoskeletal:  Left arm: Site of AV fistula appears warm, swollen, and very TTP.  Radial pulse present. No signs of neurovascular compromise. No lower extremity edema.  Neurological: He is alert.  Skin: Skin is warm and dry. He is not diaphoretic.     Labs on Admission: I have personally reviewed following labs and imaging studies  CBC: Recent Labs  Lab 07/30/18 0832 08/01/18 0944 08/05/18 1949  WBC 7.0 7.5 7.5  NEUTROABS  --   --  5.4  HGB 8.1* 7.6* 6.8*  HCT 25.1* 23.6* 20.8*  MCV 103.7* 102.2* 105.1*  PLT 82* 123* 144   Basic Metabolic Panel: Recent Labs  Lab 07/30/18 0833 07/31/18 0656 08/01/18 0942 08/01/18 0944 08/05/18 1949  NA 134* 131*  129*  --  132*  K 3.0* 3.3* 2.8*  --  3.2*  CL 101 94* 95*  --  94*  CO2 18* 21* 20*  --  26  GLUCOSE 198* 211* 218*  --  320*  BUN 44* 29* 47*  --  31*  CREATININE 8.64* 6.55* 8.51*  --  9.33*  CALCIUM 7.0* 7.1* 7.2* 7.1* 7.2*  PHOS 4.0  --  2.9  --   --    GFR: Estimated Creatinine Clearance: 6.3 mL/min (A) (by C-G formula based on SCr of 9.33 mg/dL (H)). Liver Function Tests: Recent Labs  Lab 07/30/18 3154 08/01/18 0942 08/05/18 1949  AST  --   --  33  ALT  --   --  <5  ALKPHOS  --   --  69  BILITOT  --   --  0.4  PROT  --   --  6.6  ALBUMIN 2.6* 2.4* 2.4*   No results for input(s): LIPASE, AMYLASE in the last 168 hours. No results for input(s): AMMONIA in the last 168 hours. Coagulation Profile: Recent Labs  Lab 07/31/18 0656  INR 1.08   Cardiac Enzymes: No results for input(s): CKTOTAL, CKMB, CKMBINDEX, TROPONINI in the last 168 hours. BNP (last 3 results) No results for input(s): PROBNP in the last 8760 hours. HbA1C: No results for input(s): HGBA1C in the last 72 hours. CBG: Recent Labs  Lab 07/31/18 1702 07/31/18 2114 08/01/18 0824 08/01/18 1427 08/01/18 1814  GLUCAP 285* 299* 213* 163* 385*   Lipid Profile: No results for input(s): CHOL, HDL, LDLCALC, TRIG, CHOLHDL, LDLDIRECT in the last 72 hours. Thyroid Function Tests: No results for input(s): TSH, T4TOTAL, FREET4, T3FREE, THYROIDAB in the last 72 hours. Anemia Panel: No results for input(s): VITAMINB12, FOLATE, FERRITIN, TIBC, IRON, RETICCTPCT in the last 72 hours. Urine analysis:    Component Value Date/Time   COLORURINE YELLOW 10/07/2011 1618   APPEARANCEUR CLEAR 10/07/2011 1618   LABSPEC 1.016 10/07/2011 1618   PHURINE 5.0 10/07/2011 1618   GLUCOSEU 500 (A) 10/07/2011 1618   HGBUR MODERATE (A) 10/07/2011 1618   BILIRUBINUR NEGATIVE 10/07/2011 1618   KETONESUR NEGATIVE 10/07/2011 1618   PROTEINUR >300 (A) 10/07/2011 1618   UROBILINOGEN 0.2 10/07/2011 1618   NITRITE NEGATIVE 10/07/2011  1618   LEUKOCYTESUR NEGATIVE 10/07/2011 1618    Radiological Exams on Admission: No results found.  Assessment/Plan Principal Problem:   Infection of AV graft for dialysis [facility]) Active Problems:   Type 2 diabetes mellitus with chronic kidney disease on chronic dialysis ([facility])   HTN (hypertension)   Bacteremia due to methicillin susceptible Staphylococcus aureus (MSSA)   ESRD (end stage renal disease) ([facility])   Symptomatic anemia   Hypokalemia   GERD (gastroesophageal reflux disease)   Suspected infection of left AV graft -Temp 100.5 F.  No tachycardia or hypotension. No leukocytosis.  Lactic acid normal. -Spoke to nephrology (Dr. Justin Mend), their team will see the patient in the morning -Continue broad-spectrum antibiotics-vancomycin and cefepime -[place] of LUE graft site -Blood culture x2 pending -Fentanyl PRN pain, Tylenol PRN  ADDENDUM: Ultrasound showing large hypoechoic area surrounding the left forearm AV graft, favor hematoma although infection cannot be excluded.  Recent MSSA bacteremia -Patient was admitted from 10/13-10/19 for sepsis with MSSA bacteremia, unclear source.  C. difficile panel negative for diarrhea.  Left arm dialysis graft site was stable on exam at that time.  He was treated with Ancef and ID was on board.  He had stable TTE and TEE.  Left AV graft ultrasound was also stable.  He was discharged with IV cefazolin for 10 more days of his dialysis treatments (stop date October 31). -Continue broad-spectrum antibiotics-vancomycin and cefepime -Blood culture x2 pending -Spoke to infectious disease (Dr. Graylon Good), their team will see the patient in the morning  Symptomatic anemia -Hemoglobin 6.8; was 7.6 on discharge 4 days ago.   -2 units of packed red blood cells ordered in the ED. Will change the order to 1 unit of PRBCs for symptomatic anemia. -Repeat CBC in a.m. -Check iron, ferritin, TIBC, folate, B12, reticulocyte count  Hypokalemia -Potassium 3.2. -Replete  orally -Check mag -Renal function panel in a.m.  ESRD on hemodialysis Monday Wednesday  Friday -Currently not volume overloaded -Nephrology will see the patient in the morning -Renal function panel in a.m.  Hypertension -Blood pressure currently stable.  Hold home hydralazine and amlodipine at this time.  Type 2 diabetes -Check A1c -Continue home Lantus 15 units daily -Sliding scale insulin sensitive -CBG checks  GERD -Continue home Protonix  DVT prophylaxis: SCDs Code Status: Patient wishes to be DNR. Family Communication: Sisters at bedside updated. Disposition Plan: Anticipate discharge to home versus SNF in 2 to 3 days after patient clinically improves. Consults called: Nephrology (Dr. Justin Mend), infectious disease (Dr. Graylon Good) Admission status: It is my clinical opinion that admission to INPATIENT is reasonable and necessary in this 67 y.o. male . presenting with symptoms of left arm AV graft site pain, swelling, concerning for infection of AV graft . in the context of PMH including: End-stage renal disease on hemodialysis . with pertinent positives on physical exam including: Left arm AV graft site pain, swelling, warmth. Febrile.  . Workup and treatment include IV antibiotics.  Pending evaluation by infectious disease and nephrology.  Given the aforementioned, the predictability of an adverse outcome is felt to be significant. I expect that the patient will require at least 2 midnights in the hospital to treat this condition.    Shela Leff MD [facility] Pager [phone]  If 7PM-7AM, please contact night-coverage [email or web address] Password [facility]  08/05/2018, 10:54 PM

## 2018-08-05 NOTE — Progress Notes (Signed)
Pharmacy Antibiotic Note  Roger Vazquez is a 67 y.o. male admitted on 08/05/2018 with sepsis. PMH is significant for ESRD on HD MWF. Pt reports nurse came to house and said that dialysis access was infected; port was warm and appears swollen. Pt recently discharged for sepsis and is currently receiving 2g Ancef every MWF with HD for MSSA bacteremia, starting 08/02/2018, planned to end 08/14/18. Pharmacy has been consulted for Vancomycin and Cefepime dosing. WBC - 7.5, Tmax - 100.5, lactic acid  - 1.56   Plan: Vancomycin 1250mg  x1, then 500mg  qHD Cefepime 1g x1, then 2g qHD Monitor and adjust per renal plans, clinical status, C&S, and vanc troughs as needed  Height: 5\' 6"  (167.6 cm) IBW/kg (Calculated) : 63.8  Temp (24hrs), Avg:99.8 F (37.7 C), Min:99 F (37.2 C), Max:100.5 F (38.1 C)  Recent Labs  Lab 07/30/18 0832 07/30/18 0833 07/31/18 0656 08/01/18 0942 08/01/18 0944 08/05/18 1949 08/05/18 1957  WBC 7.0  --   --   --  7.5 7.5  --   CREATININE  --  8.64* 6.55* 8.51*  --  9.33*  --   LATICACIDVEN  --   --   --   --   --   --  1.56    Estimated Creatinine Clearance: 6.3 mL/min (A) (by C-G formula based on SCr of 9.33 mg/dL (H)).    No Known Allergies  Antimicrobials this admission: Cefepime 10/22 >>  Vancomycin 10/22 >>   Dose adjustments this admission: N/A  Microbiology results: Pending  Thank you for allowing pharmacy to be a part of this patient's care.  Tyson Babinski 08/05/2018 9:05 PM

## 2018-08-06 ENCOUNTER — Encounter (HOSPITAL_COMMUNITY): Payer: Self-pay | Admitting: *Deleted

## 2018-08-06 ENCOUNTER — Inpatient Hospital Stay (HOSPITAL_COMMUNITY): Payer: Medicare PPO | Admitting: Certified Registered"

## 2018-08-06 ENCOUNTER — Other Ambulatory Visit: Payer: Self-pay

## 2018-08-06 ENCOUNTER — Encounter (HOSPITAL_COMMUNITY)
Admission: EM | Disposition: A | Discharge status: Home Health | Payer: Self-pay | Source: Home / Self Care | Attending: Internal Medicine

## 2018-08-06 DIAGNOSIS — R509 Fever, unspecified: Secondary | ICD-10-CM

## 2018-08-06 DIAGNOSIS — N185 Chronic kidney disease, stage 5: Secondary | ICD-10-CM

## 2018-08-06 HISTORY — PX: AVGG REMOVAL: SHX5153

## 2018-08-06 LAB — RETICULOCYTES
IMMATURE RETIC FRACT: 34.9 % — AB (ref 2.3–15.9)
RBC.: 1.84 MIL/uL — ABNORMAL LOW (ref 4.22–5.81)
RETIC COUNT ABSOLUTE: 39.9 10*3/uL (ref 19.0–186.0)
Retic Ct Pct: 2.2 % (ref 0.4–3.1)

## 2018-08-06 LAB — CBC
HEMATOCRIT: 19.5 % — AB (ref 39.0–52.0)
Hemoglobin: 6.3 g/dL — CL (ref 13.0–17.0)
MCH: 33.5 pg (ref 26.0–34.0)
MCHC: 32.3 g/dL (ref 30.0–36.0)
MCV: 103.7 fL — AB (ref 80.0–100.0)
PLATELETS: 155 10*3/uL (ref 150–400)
RBC: 1.88 MIL/uL — AB (ref 4.22–5.81)
RDW: 14.4 % (ref 11.5–15.5)
WBC: 6.3 10*3/uL (ref 4.0–10.5)
nRBC: 0 % (ref 0.0–0.2)

## 2018-08-06 LAB — POCT I-STAT 4, (NA,K, GLUC, HGB,HCT)
Glucose, Bld: 95 mg/dL (ref 70–99)
HCT: 26 % — ABNORMAL LOW (ref 39.0–52.0)
Hemoglobin: 8.8 g/dL — ABNORMAL LOW (ref 13.0–17.0)
POTASSIUM: 3.8 mmol/L (ref 3.5–5.1)
SODIUM: 138 mmol/L (ref 135–145)

## 2018-08-06 LAB — RENAL FUNCTION PANEL
Albumin: 2.3 g/dL — ABNORMAL LOW (ref 3.5–5.0)
Anion gap: 11 (ref 5–15)
BUN: 33 mg/dL — AB (ref 8–23)
CHLORIDE: 97 mmol/L — AB (ref 98–111)
CO2: 25 mmol/L (ref 22–32)
CREATININE: 9.81 mg/dL — AB (ref 0.61–1.24)
Calcium: 7.1 mg/dL — ABNORMAL LOW (ref 8.9–10.3)
GFR calc non Af Amer: 5 mL/min — ABNORMAL LOW (ref >60)
GFR, EST AFRICAN AMERICAN: 6 mL/min — AB (ref >60)
Glucose, Bld: 225 mg/dL — ABNORMAL HIGH (ref 70–99)
POTASSIUM: 3.6 mmol/L (ref 3.5–5.1)
Phosphorus: 3.8 mg/dL (ref 2.5–4.6)
Sodium: 133 mmol/L — ABNORMAL LOW (ref 135–145)

## 2018-08-06 LAB — VITAMIN B12: VITAMIN B 12: 932 pg/mL — AB (ref 180–914)

## 2018-08-06 LAB — FOLATE: Folate: 10.6 ng/mL (ref >5.9)

## 2018-08-06 LAB — IRON AND TIBC
IRON: 23 ug/dL — AB (ref 45–182)
Saturation Ratios: 15 % — ABNORMAL LOW (ref 17.9–39.5)
TIBC: 151 ug/dL — ABNORMAL LOW (ref 250–450)
UIBC: 128 ug/dL

## 2018-08-06 LAB — GLUCOSE, CAPILLARY
GLUCOSE-CAPILLARY: 152 mg/dL — AB (ref 70–99)
GLUCOSE-CAPILLARY: 223 mg/dL — AB (ref 70–99)
GLUCOSE-CAPILLARY: 235 mg/dL — AB (ref 70–99)
Glucose-Capillary: 225 mg/dL — ABNORMAL HIGH (ref 70–99)
Glucose-Capillary: 84 mg/dL (ref 70–99)
Glucose-Capillary: 91 mg/dL (ref 70–99)
Glucose-Capillary: 232 mg/dL — ABNORMAL HIGH (ref 70–99)

## 2018-08-06 LAB — HEMOGLOBIN A1C
HEMOGLOBIN A1C: 8.4 % — AB (ref 4.8–5.6)
MEAN PLASMA GLUCOSE: 194.38 mg/dL

## 2018-08-06 LAB — MAGNESIUM: Magnesium: 2 mg/dL (ref 1.7–2.4)

## 2018-08-06 LAB — FERRITIN: FERRITIN: 1083 ng/mL — AB (ref 24–336)

## 2018-08-06 LAB — PREPARE RBC (CROSSMATCH)

## 2018-08-06 LAB — SAVE SMEAR(SSMR), FOR PROVIDER SLIDE REVIEW

## 2018-08-06 SURGERY — REMOVAL OF ARTERIOVENOUS GORETEX GRAFT (AVGG)
Anesthesia: General | Site: Arm Upper | Laterality: Left

## 2018-08-06 MED ORDER — ONDANSETRON HCL 4 MG/2ML IJ SOLN
INTRAMUSCULAR | Status: AC
  Filled 2018-08-06: qty 2

## 2018-08-06 MED ORDER — ACETAMINOPHEN 10 MG/ML IV SOLN: 1000.0000 mg | Freq: Once | INTRAVENOUS | Status: DC | PRN

## 2018-08-06 MED ORDER — SODIUM CHLORIDE 0.9 % IV SOLN
INTRAVENOUS | Status: DC
  Administered 2018-08-06: 15:00:00 via INTRAVENOUS

## 2018-08-06 MED ORDER — OXYCODONE-ACETAMINOPHEN 5-325 MG PO TABS
ORAL_TABLET | ORAL | Status: AC
  Filled 2018-08-06: qty 1

## 2018-08-06 MED ORDER — OXYCODONE-ACETAMINOPHEN 5-325 MG PO TABS
1.0000 | ORAL_TABLET | ORAL | Status: DC | PRN
  Administered 2018-08-06: 1 via ORAL
  Administered 2018-08-07 – 2018-08-11 (×7): 2 via ORAL
  Filled 2018-08-06 (×7): qty 2

## 2018-08-06 MED ORDER — SODIUM CHLORIDE 0.9 % IV SOLN
INTRAVENOUS | Status: DC | PRN
  Administered 2018-08-06: 500 mL

## 2018-08-06 MED ORDER — PROMETHAZINE HCL 25 MG/ML IJ SOLN: 6.2500 mg | INTRAMUSCULAR | Status: DC | PRN

## 2018-08-06 MED ORDER — LIDOCAINE 2% (20 MG/ML) 5 ML SYRINGE
INTRAMUSCULAR | Status: DC | PRN
  Administered 2018-08-06: 20 mg via INTRAVENOUS

## 2018-08-06 MED ORDER — PHENYLEPHRINE 40 MCG/ML (10ML) SYRINGE FOR IV PUSH (FOR BLOOD PRESSURE SUPPORT)
PREFILLED_SYRINGE | INTRAVENOUS | Status: DC | PRN
  Administered 2018-08-06 (×2): 80 ug via INTRAVENOUS

## 2018-08-06 MED ORDER — 0.9 % SODIUM CHLORIDE (POUR BTL) OPTIME
TOPICAL | Status: DC | PRN
  Administered 2018-08-06: 1000 mL

## 2018-08-06 MED ORDER — PROPOFOL 10 MG/ML IV BOLUS
INTRAVENOUS | Status: AC
  Filled 2018-08-06: qty 20

## 2018-08-06 MED ORDER — FENTANYL CITRATE (PF) 100 MCG/2ML IJ SOLN: 25.0000 ug | INTRAMUSCULAR | Status: DC | PRN

## 2018-08-06 MED ORDER — CHLORPROMAZINE HCL 25 MG PO TABS
25.0000 mg | ORAL_TABLET | Freq: Four times a day (QID) | ORAL | Status: DC | PRN
  Administered 2018-08-06: 25 mg via ORAL
  Filled 2018-08-06 (×2): qty 1

## 2018-08-06 MED ORDER — EPHEDRINE 5 MG/ML INJ
INTRAVENOUS | Status: AC
  Filled 2018-08-06: qty 10

## 2018-08-06 MED ORDER — PROPOFOL 10 MG/ML IV BOLUS
INTRAVENOUS | Status: DC | PRN
  Administered 2018-08-06: 150 mg via INTRAVENOUS

## 2018-08-06 MED ORDER — FENTANYL CITRATE (PF) 250 MCG/5ML IJ SOLN
INTRAMUSCULAR | Status: AC
  Filled 2018-08-06: qty 5

## 2018-08-06 MED ORDER — SODIUM CHLORIDE 0.9 % IV SOLN
INTRAVENOUS | Status: DC | PRN
  Administered 2018-08-06: 50 ug/min via INTRAVENOUS

## 2018-08-06 MED ORDER — ONDANSETRON HCL 4 MG/2ML IJ SOLN
INTRAMUSCULAR | Status: DC | PRN
  Administered 2018-08-06: 4 mg via INTRAVENOUS

## 2018-08-06 MED ORDER — CEFAZOLIN SODIUM-DEXTROSE 1-4 GM/50ML-% IV SOLN
1.0000 g | INTRAVENOUS | Status: DC
  Administered 2018-08-07: 1 g via INTRAVENOUS
  Filled 2018-08-06: qty 50

## 2018-08-06 MED ORDER — ACETAMINOPHEN 325 MG PO TABS
650.0000 mg | ORAL_TABLET | Freq: Three times a day (TID) | ORAL | Status: DC | PRN
  Administered 2018-08-06 – 2018-08-07 (×2): 650 mg via ORAL
  Filled 2018-08-06 (×2): qty 2

## 2018-08-06 MED ORDER — SODIUM CHLORIDE 0.9 % IV SOLN
INTRAVENOUS | Status: AC
  Filled 2018-08-06: qty 1.2

## 2018-08-06 MED ORDER — PHENYLEPHRINE 40 MCG/ML (10ML) SYRINGE FOR IV PUSH (FOR BLOOD PRESSURE SUPPORT)
PREFILLED_SYRINGE | INTRAVENOUS | Status: AC
  Filled 2018-08-06: qty 10

## 2018-08-06 MED ORDER — FENTANYL CITRATE (PF) 250 MCG/5ML IJ SOLN
INTRAMUSCULAR | Status: DC | PRN
  Administered 2018-08-06: 50 ug via INTRAVENOUS
  Administered 2018-08-06 (×2): 25 ug via INTRAVENOUS
  Administered 2018-08-06: 50 ug via INTRAVENOUS

## 2018-08-06 MED ORDER — LIDOCAINE 2% (20 MG/ML) 5 ML SYRINGE
INTRAMUSCULAR | Status: AC
  Filled 2018-08-06: qty 5

## 2018-08-06 SURGICAL SUPPLY — 66 items
ADH SKN CLS APL DERMABOND .7 (GAUZE/BANDAGES/DRESSINGS)
BAG DECANTER FOR FLEXI CONT (MISCELLANEOUS) ×4 IMPLANT
BANDAGE ELASTIC 4 VELCRO ST LF (GAUZE/BANDAGES/DRESSINGS) ×3 IMPLANT
BIOPATCH RED 1 DISK 7.0 (GAUZE/BANDAGES/DRESSINGS) ×3 IMPLANT
BIOPATCH RED 1IN DISK 7.0MM (GAUZE/BANDAGES/DRESSINGS) ×1
BNDG GAUZE ELAST 4 BULKY (GAUZE/BANDAGES/DRESSINGS) ×3 IMPLANT
CANISTER SUCT 3000ML PPV (MISCELLANEOUS) ×4 IMPLANT
CANNULA VESSEL 3MM 2 BLNT TIP (CANNULA) ×3 IMPLANT
CATH PALINDROME RT-P 15FX19CM (CATHETERS) IMPLANT
CATH PALINDROME RT-P 15FX23CM (CATHETERS) IMPLANT
CATH PALINDROME RT-P 15FX28CM (CATHETERS) IMPLANT
CATH PALINDROME RT-P 15FX55CM (CATHETERS) IMPLANT
CLIP LIGATING EXTRA MED SLVR (CLIP) ×4 IMPLANT
CLIP LIGATING EXTRA SM BLUE (MISCELLANEOUS) ×4 IMPLANT
COVER PROBE W GEL 5X96 (DRAPES) ×4 IMPLANT
COVER SURGICAL LIGHT HANDLE (MISCELLANEOUS) ×4 IMPLANT
COVER WAND RF STERILE (DRAPES) ×4 IMPLANT
DECANTER SPIKE VIAL GLASS SM (MISCELLANEOUS) ×4 IMPLANT
DERMABOND ADVANCED (GAUZE/BANDAGES/DRESSINGS)
DERMABOND ADVANCED .7 DNX12 (GAUZE/BANDAGES/DRESSINGS) ×1 IMPLANT
DRAIN PENROSE 3/4X12 (DRAIN) ×4 IMPLANT
DRAPE C-ARM 42X72 X-RAY (DRAPES) ×1 IMPLANT
DRAPE CHEST BREAST 15X10 FENES (DRAPES) ×1 IMPLANT
ELECT REM PT RETURN 9FT ADLT (ELECTROSURGICAL) ×4
ELECTRODE REM PT RTRN 9FT ADLT (ELECTROSURGICAL) ×2 IMPLANT
GAUZE 4X4 16PLY RFD (DISPOSABLE) ×1 IMPLANT
GAUZE SPONGE 4X4 12PLY STRL LF (GAUZE/BANDAGES/DRESSINGS) ×3 IMPLANT
GLOVE BIO SURGEON STRL SZ 6.5 (GLOVE) ×4 IMPLANT
GLOVE BIO SURGEONS STRL SZ 6.5 (GLOVE) ×2
GLOVE BIOGEL PI IND STRL 6.5 (GLOVE) ×1 IMPLANT
GLOVE BIOGEL PI IND STRL 7.0 (GLOVE) ×1 IMPLANT
GLOVE BIOGEL PI INDICATOR 6.5 (GLOVE) ×2
GLOVE BIOGEL PI INDICATOR 7.0 (GLOVE) ×2
GLOVE SS BIOGEL STRL SZ 7.5 (GLOVE) ×2 IMPLANT
GLOVE SUPERSENSE BIOGEL SZ 7.5 (GLOVE) ×2
GOWN STRL REUS W/ TWL LRG LVL3 (GOWN DISPOSABLE) ×6 IMPLANT
GOWN STRL REUS W/TWL LRG LVL3 (GOWN DISPOSABLE) ×12
KIT BASIN OR (CUSTOM PROCEDURE TRAY) ×4 IMPLANT
KIT TURNOVER KIT B (KITS) ×4 IMPLANT
NDL 18GX1X1/2 (RX/OR ONLY) (NEEDLE) ×1 IMPLANT
NDL HYPO 25GX1X1/2 BEV (NEEDLE) ×1 IMPLANT
NEEDLE 18GX1X1/2 (RX/OR ONLY) (NEEDLE) ×4 IMPLANT
NEEDLE 22X1 1/2 (OR ONLY) (NEEDLE) IMPLANT
NEEDLE HYPO 25GX1X1/2 BEV (NEEDLE) ×4 IMPLANT
NS IRRIG 1000ML POUR BTL (IV SOLUTION) ×4 IMPLANT
PACK CV ACCESS (CUSTOM PROCEDURE TRAY) ×4 IMPLANT
PACK SURGICAL SETUP 50X90 (CUSTOM PROCEDURE TRAY) ×1 IMPLANT
PAD ARMBOARD 7.5X6 YLW CONV (MISCELLANEOUS) ×5 IMPLANT
SOAP 2 % CHG 4 OZ (WOUND CARE) ×1 IMPLANT
SPONGE LAP 18X18 RF (DISPOSABLE) ×3 IMPLANT
SUT ETHILON 3 0 PS 1 (SUTURE) ×10 IMPLANT
SUT PROLENE 5 0 C 1 36 (SUTURE) ×6 IMPLANT
SUT PROLENE 6 0 CC (SUTURE) ×3 IMPLANT
SUT VIC AB 3-0 SH 27 (SUTURE) ×12
SUT VIC AB 3-0 SH 27X BRD (SUTURE) ×5 IMPLANT
SUT VICRYL 4-0 PS2 18IN ABS (SUTURE) ×1 IMPLANT
SWAB CULTURE LIQUID MINI MALE (MISCELLANEOUS) ×3 IMPLANT
SWABSTICK BENZOIN STERILE (MISCELLANEOUS) ×3 IMPLANT
SYR 10ML LL (SYRINGE) ×4 IMPLANT
SYR 20CC LL (SYRINGE) ×4 IMPLANT
SYR 5ML LL (SYRINGE) ×8 IMPLANT
SYR CONTROL 10ML LL (SYRINGE) ×4 IMPLANT
TOWEL GREEN STERILE (TOWEL DISPOSABLE) ×8 IMPLANT
TOWEL GREEN STERILE FF (TOWEL DISPOSABLE) ×4 IMPLANT
UNDERPAD 30X30 (UNDERPADS AND DIAPERS) ×4 IMPLANT
WATER STERILE IRR 1000ML POUR (IV SOLUTION) ×4 IMPLANT

## 2018-08-06 NOTE — Progress Notes (Signed)
Pharmacy Antibiotic Note  Roger Vazquez is a 67 y.o. male admitted on 08/05/2018 with sepsis. PMH is significant for ESRD on HD MWF. Pt reports nurse came to house and said that dialysis access was infected; port was warm and appears swollen. Pt recently discharged for sepsis and is currently receiving 2g Ancef every MWF with HD for MSSA bacteremia, last HD on Monday. S/p Left AV graft removal today. ID consulted and recommend switching cefepime to ancef, and restart his abx course.   Plan: Ancef 1g IV Q 24 hrs F/u HD schedule F/u AV graft culture F/u ID recommendations.   Height: 5\' 6"  (167.6 cm) Weight: 134 lb 11.2 oz (61.1 kg) IBW/kg (Calculated) : 63.8  Temp (24hrs), Avg:98.4 F (36.9 C), Min:97.3 F (36.3 C), Max:100 F (37.8 C)  Recent Labs  Lab 07/31/18 0656 08/01/18 0942 08/01/18 0944 08/05/18 1949 08/05/18 1957 08/06/18 0503  WBC  --   --  7.5 7.5  --  6.3  CREATININE 6.55* 8.51*  --  9.33*  --  9.81*  LATICACIDVEN  --   --   --   --  1.56  --     Estimated Creatinine Clearance: 6.2 mL/min (A) (by C-G formula based on SCr of 9.81 mg/dL (H)).    No Known Allergies  Antimicrobials this admission: Cefepime 10/23 x 1 Vancomycin 10/22 >>  Ancef 10/23 >>   Dose adjustments this admission: N/A  Microbiology results: Pending  Thank you for allowing pharmacy to be a part of this patient's care.  Maryanna Shape, PharmD, BCPS, BCPPS Clinical Pharmacist  Pager: [phone]   08/06/2018 9:49 PM

## 2018-08-06 NOTE — Progress Notes (Addendum)
[facility] Progress Note   Background: Roger Vazquez is a 67 Y/O male with ESRD on HD MWF at [facility]. Recent admission 10/13-10/19/2019 for MSSA bacteremia, unknown source. TTE and TEE was negative, seen by VVS-AVG thought to be without clinical signs of infection. He was discharged on Ancef IV 2 grams until 08/14/18.  He presented to HD 08/04/2018 and was noted to have pain and swelling in AVG. He says he has felt awful, had temperature as high as 102 at home, poor appetite and malaise. He presented back to ED 08/05/2018 for evaluation for fever and pain in AVG.   Subjective: Says he feels awful. C/O pain in LUA AVG. Says he has had fever during night.    Objective Vitals:   08/05/18 2300 08/06/18 0024 08/06/18 0512 08/06/18 0851  BP: 129/61 (!) 163/42 (!) 134/49 (!) 153/59  Pulse: 91 86 83 83  Resp: 19 18 14 18   Temp:  100 F (37.8 C) 98.9 F (37.2 C) 98.4 F (36.9 C)  TempSrc:  Oral Oral Oral  SpO2: 100% 100% 99% 100%  Weight:  61.1 kg    Height:       Physical Exam General: Ill appearing male in NAD Heart: S1,S2 RRR Lungs: CTAB A/P Abdomen: soft, active BS Extremities: No LE edema Dialysis Access: LUA AVG with swelling,erythema, tender to touch. Abraded lesion at site of needle stick lower portion of AVG. + bruit.    Additional Objective Labs: Basic Metabolic Panel: Recent Labs  Lab 08/01/18 0942 08/01/18 0944 08/05/18 1949 08/06/18 0503  NA 129*  --  132* 133*  K 2.8*  --  3.2* 3.6  CL 95*  --  94* 97*  CO2 20*  --  26 25  GLUCOSE 218*  --  320* 225*  BUN 47*  --  31* 33*  CREATININE 8.51*  --  9.33* 9.81*  CALCIUM 7.2* 7.1* 7.2* 7.1*  PHOS 2.9  --   --  3.8   Liver Function Tests: Recent Labs  Lab 08/01/18 0942 08/05/18 1949 08/06/18 0503  AST  --  33  --   ALT  --  <5  --   ALKPHOS  --  69  --   BILITOT  --  0.4  --   PROT  --  6.6  --   ALBUMIN 2.4* 2.4* 2.3*   No results for input(s): LIPASE, AMYLASE in  the last 168 hours. CBC: Recent Labs  Lab 08/01/18 0944 08/05/18 1949 08/06/18 0503  WBC 7.5 7.5 6.3  NEUTROABS  --  5.4  --   HGB 7.6* 6.8* 6.3*  HCT 23.6* 20.8* 19.5*  MCV 102.2* 105.1* 103.7*  PLT 123* 165 155   Blood Culture    Component Value Date/Time   SDES BLOOD RIGHT HAND 08/05/2018 2122   SPECREQUEST  08/05/2018 2122    BOTTLES DRAWN AEROBIC ONLY Blood Culture adequate volume   CULT  08/05/2018 2122    NO GROWTH < 12 HOURS Performed at [facility] [address], [place], [place] [address]    REPTSTATUS PENDING 08/05/2018 2122    Cardiac Enzymes: No results for input(s): CKTOTAL, CKMB, CKMBINDEX, TROPONINI in the last 168 hours. CBG: Recent Labs  Lab 08/01/18 1427 08/01/18 1814 08/06/18 0025 08/06/18 0552 08/06/18 0850  GLUCAP 163* 385* 223* 225* 235*   Iron Studies:  Recent Labs    08/05/18 2348  IRON 23*  TIBC 151*  FERRITIN 1,083*   @lablastinr3 @ Studies/Results: [place]  [place] Soft Tissue Non Vascular  Result Date: 08/06/2018 CLINICAL DATA:  Infection of AV graft. EXAM: ULTRASOUND LEFT UPPER EXTREMITY LIMITED TECHNIQUE: Ultrasound examination of the upper extremity soft tissues was performed in the area of clinical concern. COMPARISON:  None FINDINGS: Ultrasound performed in the left forearm in the area of concern. There is a large heterogeneous hypoechoic mass noted surrounding the left forearm AV graft. This measures 4.0 x 3.9 x 3.6 cm. Burtis Junes this representing a large hematoma. Infection cannot be excluded. IMPRESSION: Large hypoechoic area surrounding the left forearm AV graft, favor hematoma although infection cannot be excluded. Electronically Signed   By: Rolm Baptise M.D.   On: 08/06/2018 02:54   Medications: . ceFEPime (MAXIPIME) IV     . sodium chloride   Intravenous Once  . aspirin  81 mg Oral Daily  . insulin aspart  0-9 Units Subcutaneous TID WC  . insulin glargine  15 Units Subcutaneous Daily  . multivitamin with  minerals  1 tablet Oral Daily  . pantoprazole  40 mg Oral BID  . vancomycin  500 mg Intravenous Q M,W,F-HD   HD Orders: [facility] M[facility] 3 hr 45 min 180 NRe 400/Autoflow 1.5 59 kg 3.0 K/2.5 Ca UFP 4  LUA AVG -Heparin 4000 units IV TIW -Mircera 200 mcg IV q 2 weeks (Last dose 07/16/2018 HGB 10 07/25/18)  Assessment/Plan: 1. Suspected infected AVG- BC NG 12 hrs. WBC 6.3. On Cefepime/Vanc per primary. [place] of AVG showed large heterogeneous hypoechoic mass surrounding AVG. Could be hematoma. VVS consulted. 2. ESRD -MWF. HD today after issue with AVG investigated. K+ 3.8. Chronic Hypokalemia on 3.0 K bath.  3. Anemia - HGB noted to be 6.3. 1 unit PRBCs pending-issue with T &C. Transfuse when blood available. Give Aranesp 200 mcg IV with HD today.  4. Secondary hyperparathyroidism - Ca 7.1 C Ca 8.4. No binders, VDRA on OP med list. RFP added to labs.  5. HTN/volume - BP controlled, no evidence of volume overload by exam. Left HD 2.4 kg above OP EDW 10/21. Wt here 61.1 kg. Attempt 2-2.5 liters in HD 6. Nutrition - Albumin 2.5 NPO at present. Renal/Carb mod diet, prostat and renal vit when able to eat.  7. DM-per primary   Roger Vazquez. Roger Vazquez 08/06/2018, 9:34 AM  Roger Vazquez [phone]  I have seen and examined this patient and agree with plan and assessment in the above note with renal recommendations/intervention highlighted.  Pt with overtly infected AVG which was likely the source of his infection at his last admission 10/13-10/19/19.  We have consulted VVS for removal of infected AVG and will likely require a temp HD catheter placement over the next 24-48 hours.  No indication for urgent HD today. Roger John A Bernis Schreur,MD 08/06/2018 1:23 PM

## 2018-08-06 NOTE — Op Note (Addendum)
    OPERATIVE REPORT  DATE OF SURGERY: 08/06/2018  PATIENT: Roger Vazquez, 67 y.o. 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  PHYSICIAN ASSISTANT: Matt Eveland, PA-C  ANESTHESIA: General  EBL: per anesthesia record  Total I/O In: 400 [I.V.:400] Out: 150 [Blood:150]  BLOOD ADMINISTERED: none  DRAINS: none  SPECIMEN: none  COUNTS CORRECT:  YES  PATIENT DISPOSITION:  PACU - hemodynamically stable  PROCEDURE DETAILS: Patient was taken to the operative placed supine position with area of the left arm left axilla were prepped and draped in the usual sterile fashion.  An incision over his prior axillary incision.  His prior treatment at all been at [facility] so had minimal information regarding his prior treatment.  He did have incision in his axilla and carried and isolate the arterial and venous limbs.  There was no purulence at this area.  The venous anastomosis was visualized and the patient had a stent in the venous anastomosis extending into her thoracically.  The arterial anastomosis was in the same area and there was no evidence of purulence at this area.  The axillary artery was exposed proximal distal to the arterial anastomosis.  A separate counterincision was made on the forearm over the graft and the nerves incision over the purulent area itself.  There was occlusion of the graft at the arteriovenous anastomosis prior to exploring this area.  There was complete disruption of the graft at this area and gross pus.  This was sent for culture and sensitivity.  This area was debrided and the graft was transected near the arterial anastomosis.  The vein was occluded high in the axilla and was transected through the stent.  The stent did include intrathoracic leak.  The stent and  vein were oversewn with a 5-0 Prolene in 2 layers.  There was no vein for vein patch.  There was no evidence of infection in the axilla and therefore occluded the axillary artery above and below the arterial anastomosis.  I left a very small cuff of Gore-Tex on the arterial anastomosis and oversewed this with 2 layers of 5-0 Prolene suture.  The remaining graft was removed in its entirety.  The wounds irrigated with saline and hemostasis talus cautery.  A Penrose drain was brought through the tunnel and out through the area that had been disrupted.  The incision in the axilla and the counterincision were both closed with 3-0 nylon mattress sutures.  A sterile dressing was applied and the patient was transferred to the recovery room in stable condition  This case was discussed with the nephrology team and the plan will be for placement of a tunneled catheter in 48 hours as long as he is showing no evidence of sepsis.   Rosetta Posner, M.D., [facility] 08/06/2018 6:44 PM

## 2018-08-06 NOTE — Transfer of Care (Signed)
Immediate Anesthesia Transfer of Care Note  Patient: Roger Vazquez  Procedure(s) Performed: REMOVAL OF LEFT ARM GRAFT (Left Arm Upper)  Patient Location: PACU  Anesthesia Type:General  Level of Consciousness: awake and alert   Airway & Oxygen Therapy: Patient Spontanous Breathing  Post-op Assessment: Report given to RN  Post vital signs: Reviewed and stable  Last Vitals:  Vitals Value Taken Time  BP    Temp    Pulse    Resp    SpO2      Last Pain:  Vitals:   08/06/18 1433  TempSrc:   PainSc: 0-No pain      Patients Stated Pain Goal: 0 (24/23/53 6144)  Complications: No apparent anesthesia complications

## 2018-08-06 NOTE — Progress Notes (Signed)
ID PROGRESS NOTE  We will see patient formally tomorrow am for abtx recs. Suspect that his graft had been the nidus of infection from his recent staph aureus bacteremia and hospitalization. Will restart "abtx clock" now that nidus has been removed. Please continue on cefazolin renally dosed.  Elzie Rings [facility] [phone]

## 2018-08-06 NOTE — Anesthesia Preprocedure Evaluation (Addendum)
Anesthesia Evaluation  Patient identified by MRN, date of birth, ID band Patient awake    Reviewed: Allergy & Precautions, NPO status , Patient's Chart, lab work & pertinent test results  History of Anesthesia Complications Negative for: history of anesthetic complications  Airway Mallampati: II  TM Distance: >3 FB Neck ROM: Full    Dental  (+) Edentulous Upper, Edentulous Lower, Dental Advisory Given   Pulmonary neg pulmonary ROS, former smoker,    breath sounds clear to auscultation       Cardiovascular hypertension, Pt. on medications  Rhythm:Regular Rate:Normal     Neuro/Psych negative neurological ROS  negative psych ROS   GI/Hepatic GERD  ,(+) Hepatitis -, C  Endo/Other  negative endocrine ROSdiabetes, Type 2  Renal/GU Dialysis and ESRFRenal disease  negative genitourinary   Musculoskeletal negative musculoskeletal ROS (+)   Abdominal   Peds negative pediatric ROS (+)  Hematology  (+) anemia ,   Anesthesia Other Findings   Reproductive/Obstetrics negative OB ROS                            Anesthesia Physical Anesthesia Plan  ASA: III  Anesthesia Plan: General   Post-op Pain Management:    Induction: Intravenous  PONV Risk Score and Plan: 2 and Ondansetron and Treatment may vary due to age or medical condition  Airway Management Planned: LMA  Additional Equipment:   Intra-op Plan:   Post-operative Plan:   Informed Consent: I have reviewed the patients History and Physical, chart, labs and discussed the procedure including the risks, benefits and alternatives for the proposed anesthesia with the patient or authorized representative who has indicated his/her understanding and acceptance.   Dental advisory given  Plan Discussed with: CRNA and Surgeon  Anesthesia Plan Comments: (Discussed code status with patient. He would like to be "full code" in the perioperative  setting. His code status will remain DNR outside of the perioperative time period.)       Anesthesia Quick Evaluation

## 2018-08-06 NOTE — Anesthesia Procedure Notes (Signed)
Procedure Name: LMA Insertion Date/Time: 08/06/2018 3:55 PM Performed by: Barrington Ellison, CRNA Pre-anesthesia Checklist: Patient identified, Emergency Drugs available, Suction available and Patient being monitored Patient Re-evaluated:Patient Re-evaluated prior to induction Oxygen Delivery Method: Circle System Utilized Preoxygenation: Pre-oxygenation with 100% oxygen Induction Type: IV induction Ventilation: Mask ventilation without difficulty LMA: LMA inserted LMA Size: 4.0 Number of attempts: 1 Placement Confirmation: positive ETCO2 Tube secured with: Tape Dental Injury: Teeth and Oropharynx as per pre-operative assessment

## 2018-08-06 NOTE — Progress Notes (Signed)
Patient ID: Roger Vazquez, male   DOB: 1951/09/14, 67 y.o.   MRN: [id number] Patient is admitted with methicillin-resistant staph aureus bacteremia.  Has obvious infection in his left upper arm AV graft.  Discussed with the patient and his family present.  Recommend urgent treatment with removal.  Also spoke with nephrology service.  Would recommend holding off on catheter placement for several days to allow bacteremia to clear to reduce risk for infection of his catheter.  Will take to the operating room this afternoon and plan catheter in 48 hours on Friday unless potassium is an issue

## 2018-08-06 NOTE — Progress Notes (Addendum)
PROGRESS NOTE    Roger Vazquez  RCV:[id number] DOB: 1951-01-02 DOA: 08/05/2018 PCP: [facility]    Brief Narrative:  67 y.o. male with medical history significant of end-stage renal disease on MWF hemodialysis, hypertension, diabetes presenting to the hospital for further evaluation of left arm swelling at the site of his AV graft.  Family states patient was discharged from the hospital on Saturday.  He then went to dialysis on Monday (yesterday) and it was noted that his left arm was swollen.  As such, patient was advised to come into the hospital for further evaluation.  He goes for hemodialysis to the [facility].  Patient has had a poor appetite since yesterday and had a fever of 102 F at home today.  He has been feeling weak. Denies having any nausea or vomiting.  He is complaining of pain in his left arm at the site of the AV graft.  Patient was admitted from 10/13-10/19 for sepsis with MSSA bacteremia, unclear source.  C. difficile panel negative for diarrhea.  Left arm dialysis graft site was stable on exam.  He was treated with Ancef and ID was on board.  He had stable TTE and TEE.  Left AV graft ultrasound was also stable.  He was discharged with IV cefazolin for 10 more days of his dialysis treatments (stop date October 31).   ED Course: Temperature 100.5 F.  Remainder of vitals stable.  No leukocytosis.  Lactic acid normal.  Hemoglobin 6.8; was 7.6 on discharge 4 days ago.  Chest x-ray showing no acute finding.  2 units of packed red blood cells ordered in the ED.  TRH patient admitted.  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  No tachycardia or hypotension. No leukocytosis.  Lactic acid normal. -Spoke to nephrology (Dr. Justin Mend), their team will see the patient in the morning -Continue broad-spectrum antibiotics-continued on vancomycin and cefepime -blood cultures pending -Fentanyl PRN pain, Tylenol PRN -Seen by Vascular Surgery  Recent MSSA bacteremia -Patient was admitted from 10/13-10/19 for sepsis with MSSA bacteremia, unclear source.  -C. difficile panel negative for diarrhea.   -Left arm dialysis graft site was stable on exam at that time.   -Pt was treated with Ancef and ID was on board.  He had stable TTE and TEE.  Left AV graft ultrasound was also stable.   -Patient was discharged with IV cefazolin for 10 more days of his dialysis treatments (stop date October 31). -Continued on broad-spectrum antibiotics -vancomycin and cefepime -Blood culture x2 pending -Admitting physician discussed case with ID  Symptomatic anemia -Hemoglobin 6.8; was 7.6 on discharge 4 days ago.   -2 units of packed red blood cells ordered in the ED. Will change the order to 1 unit of PRBCs for symptomatic anemia. -Recheck CBC in AM  Hypokalemia -Potassium 3.2 noted -Recheck bmet in AM  ESRD on hemodialysis Monday Wednesday Friday -Currently not volume overloaded -Nephrology will see the patient in the morning -Will repeat bmet in AM  Hypertension -Hold home hydralazine and amlodipine at this time. -BP stable at this time  Type 2 diabetes - A1c noted to be 8.4 -Continue home Lantus 15 units daily -Sliding scale insulin sensitive -CBG checks  GERD -Continue home Protonix -Stable at present  DVT prophylaxis: heparin subQ Code Status: DNR Family Communication: Pt in room Disposition Plan: Uncertain at this time  Consultants:   Nephrology, ID  Procedures:     Antimicrobials: Anti-infectives (From admission, onward)   Start      Dose/Rate Route Frequency Ordered Stop   08/06/18 1200  [MAR Hold]  ceFEPIme (MAXIPIME) 2 g in sodium chloride 0.9 % 100 mL IVPB     (MAR Hold since Wed 08/06/2018 at 1407. Reason: Transfer to a Procedural area.)   2 g 200 mL/hr over 30 Minutes Intravenous Every M-W-F (Hemodialysis) 08/05/18 2124     08/06/18 1200  [MAR Hold]  vancomycin (VANCOCIN) IVPB 500 mg/100 ml premix     (MAR Hold since Wed 08/06/2018 at 1407. Reason: Transfer to a Procedural area.)   500 mg 100 mL/hr over 60 Minutes Intravenous Every M-W-F (Hemodialysis) 08/05/18 2124     08/05/18 2200  vancomycin (VANCOCIN) 1,250 mg in sodium chloride 0.9 % 250 mL IVPB     1,250 mg 166.7 mL/hr over 90 Minutes Intravenous  Once 08/05/18 2108 08/06/18 0123   08/05/18 2130  ceFEPIme (MAXIPIME) 1 g in sodium chloride 0.9 % 100 mL IVPB     1 g 200 mL/hr over 30 Minutes Intravenous  Once 08/05/18 2043 08/06/18 0002   08/05/18 2045  vancomycin (VANCOCIN) 1,178 mg in sodium chloride 0.9 % 500 mL IVPB  Status:  Discontinued     20 mg/kg  58.9 kg 250 mL/hr over 120 Minutes Intravenous  Once 08/05/18 2043 08/05/18 2108       Subjective: Complains of hiccups this AM  Objective: Vitals:   08/06/18 1102 08/06/18 1131 08/06/18 1338 08/06/18 1434  BP: (!) 140/53 (!) 120/55 (!) 125/57   Pulse: 81 79 73   Resp: 18 16 16    Temp: 99 F (37.2 C) 98 F (36.7 C) 98.5 F (36.9 C)   TempSrc: Oral Oral Oral   SpO2: 100% 100% 100%   Weight:    61.1 kg  Height:    5\' 6"  (1.676 m)    Intake/Output Summary (Last 24 hours) at 08/06/2018 1641 Last data filed at 08/06/2018 1500 Gross per 24 hour  Intake 0 ml  Output 0 ml  Net 0 ml   Filed Weights   08/05/18 2128 08/06/18 0024 08/06/18 1434  Weight: 58.9 kg 61.1 kg 61.1 kg    Examination:  General exam: Appears calm and comfortable  Respiratory system: Clear to auscultation. Respiratory effort normal. Cardiovascular system: S1 & S2 heard, RRR Gastrointestinal system: Abdomen is  nondistended, soft and nontender. No organomegaly or masses felt. Normal bowel sounds heard. Central nervous system: Alert and oriented. No focal neurological deficits. Extremities: Symmetric 5 x 5 power. Skin: No rashes, lesions Psychiatry: Judgement and insight appear normal. Mood & affect appropriate.   Data Reviewed: I have personally reviewed following labs and imaging studies  CBC: Recent Labs  Lab 08/01/18 0944 08/05/18 1949 08/06/18 0503 08/06/18 1435  WBC 7.5 7.5 6.3  --   NEUTROABS  --  5.4  --   --   HGB 7.6* 6.8* 6.3* 8.8*  HCT 23.6* 20.8* 19.5* 26.0*  MCV 102.2* 105.1* 103.7*  --   PLT 123* 165 155  --    Basic Metabolic Panel: Recent Labs  Lab 07/31/18 0656 08/01/18 0942 08/01/18 0944  08/05/18 1949 08/05/18 2348 08/06/18 0503 08/06/18 1435  NA 131* 129*  --  132*  --  133* 138  K 3.3* 2.8*  --  3.2*  --  3.6 3.8  CL 94* 95*  --  94*  --  97*  --   CO2 21* 20*  --  26  --  25  --   GLUCOSE 211* 218*  --  320*  --  225* 95  BUN 29* 47*  --  31*  --  33*  --   CREATININE 6.55* 8.51*  --  9.33*  --  9.81*  --   CALCIUM 7.1* 7.2* 7.1* 7.2*  --  7.1*  --   MG  --   --   --   --  2.0  --   --   PHOS  --  2.9  --   --   --  3.8  --    GFR: Estimated Creatinine Clearance: 6.2 mL/min (A) (by C-G formula based on SCr of 9.81 mg/dL (H)). Liver Function Tests: Recent Labs  Lab 08/01/18 0942 08/05/18 1949 08/06/18 0503  AST  --  33  --   ALT  --  <5  --   ALKPHOS  --  69  --   BILITOT  --  0.4  --   PROT  --  6.6  --   ALBUMIN 2.4* 2.4* 2.3*   No results for input(s): LIPASE, AMYLASE in the last 168 hours. No results for input(s): AMMONIA in the last 168 hours. Coagulation Profile: Recent Labs  Lab 07/31/18 0656  INR 1.08   Cardiac Enzymes: No results for input(s): CKTOTAL, CKMB, CKMBINDEX, TROPONINI in the last 168 hours. BNP (last 3 results) No results for input(s): PROBNP in the last 8760 hours. HbA1C: Recent Labs    08/06/18 0503  HGBA1C  8.4*   CBG: Recent Labs  Lab 08/01/18 1814 08/06/18 0025 08/06/18 0552 08/06/18 0850 08/06/18 1204  GLUCAP 385* 223* 225* 235* 152*   Lipid Profile: No results for input(s): CHOL, HDL, LDLCALC, TRIG, CHOLHDL, LDLDIRECT in the last 72 hours. Thyroid Function Tests: No results for input(s): TSH, T4TOTAL, FREET4, T3FREE, THYROIDAB in the last 72 hours. Anemia Panel: Recent Labs    08/05/18 2108 08/05/18 2348  VITAMINB12  --  932*  FOLATE 10.6  --   FERRITIN  --  1,083*  TIBC  --  151*  IRON  --  23*  RETICCTPCT  --  2.2   Sepsis Labs: Recent Labs  Lab 08/05/18 1957  LATICACIDVEN 1.56    Recent Results (from the past 240 hour(s))  C difficile quick scan w PCR reflex     Status: None   Collection Time: 07/27/18  9:28 PM  Result Value Ref Range Status   C Diff antigen NEGATIVE NEGATIVE Final   C Diff toxin NEGATIVE NEGATIVE Final   C Diff interpretation No C. difficile detected.  Final    Comment: Performed at [facility], [place] [address], [place], [place] [address]  MRSA PCR Screening     Status: None   Collection Time: 07/28/18  1:30 AM  Result Value Ref Range Status   MRSA by PCR NEGATIVE NEGATIVE Final    Comment:        The GeneXpert MRSA Assay (FDA approved for NASAL specimens only), is one component of a comprehensive MRSA colonization surveillance program. It is not intended to diagnose MRSA infection nor to guide or monitor treatment for MRSA  infections. Performed at [facility], [place] [address], [place], [place] [address]   Culture, blood (routine x 2)     Status: None   Collection Time: 07/29/18  5:00 PM  Result Value Ref Range Status   Specimen Description BLOOD RIGHT HAND  Final   Special Requests   Final    BOTTLES DRAWN AEROBIC ONLY Blood Culture adequate volume   Culture   Final    NO GROWTH 5 DAYS Performed at [facility], [place] [address] [place], [place] [address]    Report Status 08/03/2018 FINAL  Final  Culture,  blood (routine x 2)     Status: None   Collection Time: 07/29/18  5:07 PM  Result Value Ref Range Status   Specimen Description BLOOD RIGHT FOREARM  Final   Special Requests   Final    BOTTLES DRAWN AEROBIC ONLY Blood Culture adequate volume   Culture   Final    NO GROWTH 5 DAYS Performed at [facility], [place] [address], [place], [place] [address]    Report Status 08/03/2018 FINAL  Final  Blood culture (routine x 2)     Status: None (Preliminary result)   Collection Time: 08/05/18  9:22 PM  Result Value Ref Range Status   Specimen Description BLOOD RIGHT HAND  Final   Special Requests   Final    BOTTLES DRAWN AEROBIC ONLY Blood Culture adequate volume   Culture   Final    NO GROWTH < 24 HOURS Performed at [facility], [place] [address] [place], [place] [address]    Report Status PENDING  Incomplete     Radiology Studies: [place] Soft Tissue Non Vascular  Result Date: 08/06/2018 CLINICAL DATA:  Infection of AV graft. EXAM: ULTRASOUND LEFT UPPER EXTREMITY LIMITED TECHNIQUE: Ultrasound examination of the upper extremity soft tissues was performed in the area of clinical concern. COMPARISON:  None FINDINGS: Ultrasound performed in the left forearm in the area of concern. There is a large heterogeneous hypoechoic mass noted surrounding the left forearm AV graft. This measures 4.0 x 3.9 x 3.6 cm. Burtis Junes this representing a large hematoma. Infection cannot be excluded. IMPRESSION: Large hypoechoic area surrounding the left forearm AV graft, favor hematoma although infection cannot be excluded. Electronically Signed   By: Rolm Baptise M.D.   On: 08/06/2018 02:54    Scheduled Meds: . [MAR Hold] aspirin  81 mg Oral Daily  . [MAR Hold] insulin aspart  0-9 Units Subcutaneous TID WC  . [MAR Hold] insulin glargine  15 Units Subcutaneous Daily  . [MAR Hold] multivitamin with minerals  1 tablet Oral Daily  . [MAR Hold] pantoprazole  40 mg Oral BID  . [MAR Hold]  vancomycin  500 mg Intravenous Q M,W,F-HD   Continuous Infusions: . sodium chloride 10 mL/hr at 08/06/18 1436  . [MAR Hold] ceFEPime (MAXIPIME) IV       LOS: 1 day   Marylu Lund, MD [facility] Pager On Amion  If 7PM-7AM, please contact night-coverage 08/06/2018, 4:41 PM

## 2018-08-07 ENCOUNTER — Encounter (HOSPITAL_COMMUNITY): Payer: Self-pay | Admitting: Vascular Surgery

## 2018-08-07 DIAGNOSIS — B9561 Methicillin susceptible Staphylococcus aureus infection as the cause of diseases classified elsewhere: Secondary | ICD-10-CM

## 2018-08-07 DIAGNOSIS — T827XXA Infection and inflammatory reaction due to other cardiac and vascular devices, implants and grafts, initial encounter: Principal | ICD-10-CM

## 2018-08-07 DIAGNOSIS — N186 End stage renal disease: Secondary | ICD-10-CM

## 2018-08-07 DIAGNOSIS — E1122 Type 2 diabetes mellitus with diabetic chronic kidney disease: Secondary | ICD-10-CM

## 2018-08-07 DIAGNOSIS — Z87891 Personal history of nicotine dependence: Secondary | ICD-10-CM

## 2018-08-07 DIAGNOSIS — I120 Hypertensive chronic kidney disease with stage 5 chronic kidney disease or end stage renal disease: Secondary | ICD-10-CM

## 2018-08-07 DIAGNOSIS — Z992 Dependence on renal dialysis: Secondary | ICD-10-CM

## 2018-08-07 DIAGNOSIS — R7881 Bacteremia: Secondary | ICD-10-CM

## 2018-08-07 LAB — RENAL FUNCTION PANEL
Albumin: 2.2 g/dL — ABNORMAL LOW (ref 3.5–5.0)
Anion gap: 15 (ref 5–15)
BUN: 39 mg/dL — ABNORMAL HIGH (ref 8–23)
CO2: 19 mmol/L — ABNORMAL LOW (ref 22–32)
Calcium: 7.2 mg/dL — ABNORMAL LOW (ref 8.9–10.3)
Chloride: 100 mmol/L (ref 98–111)
Creatinine, Ser: 11.41 mg/dL — ABNORMAL HIGH (ref 0.61–1.24)
GFR calc Af Amer: 5 mL/min — ABNORMAL LOW (ref >60)
GFR calc non Af Amer: 4 mL/min — ABNORMAL LOW (ref >60)
Glucose, Bld: 160 mg/dL — ABNORMAL HIGH (ref 70–99)
Phosphorus: 4.9 mg/dL — ABNORMAL HIGH (ref 2.5–4.6)
Potassium: 4.3 mmol/L (ref 3.5–5.1)
Sodium: 134 mmol/L — ABNORMAL LOW (ref 135–145)

## 2018-08-07 LAB — GLUCOSE, CAPILLARY
GLUCOSE-CAPILLARY: 160 mg/dL — AB (ref 70–99)
GLUCOSE-CAPILLARY: 96 mg/dL (ref 70–99)
Glucose-Capillary: 143 mg/dL — ABNORMAL HIGH (ref 70–99)
Glucose-Capillary: 146 mg/dL — ABNORMAL HIGH (ref 70–99)

## 2018-08-07 MED ORDER — MORPHINE SULFATE (PF) 2 MG/ML IV SOLN: 2.0000 mg | INTRAVENOUS | Status: AC | PRN

## 2018-08-07 MED ORDER — CHLORHEXIDINE GLUCONATE CLOTH 2 % EX PADS
6.0000 | MEDICATED_PAD | Freq: Every day | CUTANEOUS | Status: DC
  Administered 2018-08-07 – 2018-08-10 (×4): 6 via TOPICAL

## 2018-08-07 MED ORDER — CEFAZOLIN SODIUM-DEXTROSE 1-4 GM/50ML-% IV SOLN
1.0000 g | INTRAVENOUS | Status: AC
  Administered 2018-08-07 – 2018-08-10 (×5): 1 g via INTRAVENOUS
  Filled 2018-08-07 (×4): qty 50

## 2018-08-07 NOTE — Progress Notes (Signed)
[facility]  Subjective  - POD#1 s/p excision of infected LUE AV graft.  No acute events overnight.  Needs dialysis access now.    Objective (!) 144/67 70 98.1 F (36.7 C) (Oral) 18 100%  Intake/Output Summary (Last 24 hours) at 08/07/2018 0949 Last data filed at 08/07/2018 0519 Gross per 24 hour  Intake 520 ml  Output 150 ml  Net 370 ml    Left arm wrapped, c/d/i  Laboratory Lab Results: Recent Labs    08/05/18 1949 08/06/18 0503 08/06/18 1435  WBC 7.5 6.3  --   HGB 6.8* 6.3* 8.8*  HCT 20.8* 19.5* 26.0*  PLT 165 155  --    BMET Recent Labs    08/06/18 0503 08/06/18 1435 08/07/18 0735  NA 133* 138 134*  K 3.6 3.8 4.3  CL 97*  --  100  CO2 25  --  19*  GLUCOSE 225* 95 160*  BUN 33*  --  39*  CREATININE 9.81*  --  11.41*  CALCIUM 7.1*  --  7.2*    COAG Lab Results  Component Value Date   INR 1.08 07/31/2018   INR 0.99 07/27/2018   INR 0.97 10/07/2011   No results found for: PTT  Assessment/Planning: Doing ok POD#1 s/p left upper extremity AV graft.  Will post for [facility] tomorrow in OR.  Please have NPO after midnight.  Marty Heck 08/07/2018 9:49 AM --

## 2018-08-07 NOTE — Progress Notes (Signed)
PROGRESS NOTE    Roger Vazquez  KLK:[id number] DOB: 12-03-1950 DOA: 08/05/2018 PCP: [facility]    Brief Narrative:  67 y.o. male with medical history significant of end-stage renal disease on MWF hemodialysis, hypertension, diabetes presenting to the hospital for further evaluation of left arm swelling at the site of his AV graft.  Family states patient was discharged from the hospital on Saturday.  He then went to dialysis on Monday (yesterday) and it was noted that his left arm was swollen.  As such, patient was advised to come into the hospital for further evaluation.  He goes for hemodialysis to the [facility].  Patient has had a poor appetite since yesterday and had a fever of 102 F at home today.  He has been feeling weak. Denies having any nausea or vomiting.  He is complaining of pain in his left arm at the site of the AV graft.  Patient was admitted from 10/13-10/19 for sepsis with MSSA bacteremia, unclear source.  C. difficile panel negative for diarrhea.  Left arm dialysis graft site was stable on exam.  He was treated with Ancef and ID was on board.  He had stable TTE and TEE.  Left AV graft ultrasound was also stable.  He was discharged with IV cefazolin for 10 more days of his dialysis treatments (stop date October 31).   ED Course: Temperature 100.5 F.  Remainder of vitals stable.  No leukocytosis.  Lactic acid normal.  Hemoglobin 6.8; was 7.6 on discharge 4 days ago.  Chest x-ray showing no acute finding.  2 units of packed red blood cells ordered in the ED.  TRH patient admitted.  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  No tachycardia or hypotension. No leukocytosis.  Lactic acid normal. -Spoke to nephrology (Dr. Justin Mend), their team will see the patient in the morning -Continue broad-spectrum antibiotics-continued on vancomycin and cefepime -blood cultures thus far negative -Fentanyl PRN pain, Tylenol PRN -Seen by Vascular Surgery, s/p removal of graft. Plan for [facility] 10/25  Recent MSSA bacteremia -Patient was admitted from 10/13-10/19 for sepsis with MSSA bacteremia, unclear source.  -C. difficile panel negative for diarrhea.   -Left arm dialysis graft site was stable on exam at that time.   -Pt was treated with Ancef and ID was on board.  He had stable TTE and TEE.  Left AV graft ultrasound was also stable.   -Patient was discharged with IV cefazolin for 10 more days of his dialysis treatments (stop date October 31). -Continued on broad-spectrum antibiotics per above -continued on vancomycin and cefepime -Blood culture thus far neg -ID following  Symptomatic anemia -Hemodynamically stable -Continue to follow CBC  Hypokalemia -normalized -Continue to follow  ESRD on hemodialysis Monday Wednesday Friday -Currently not volume overloaded -Nephrology following  Hypertension -Hold home hydralazine and amlodipine at this time. -BP remains stable currently  Type 2 diabetes - A1c noted to be 8.4 -Continue home Lantus 15 units daily -Sliding scale insulin sensitive -Continue to follow blood glucose  GERD -Continue home Protonix -remains stable currently  DVT prophylaxis: heparin  subQ Code Status: DNR Family Communication: Pt in room, family at bedside Disposition Plan: Uncertain at this time  Consultants:   Nephrology, ID  Procedures:     Antimicrobials: Anti-infectives (From admission, onward)   Start     Dose/Rate Route Frequency Ordered Stop   08/07/18 2200  ceFAZolin (ANCEF) IVPB 1 g/50 mL premix       1 g 100 mL/hr over 30 Minutes Intravenous Every 24 hours 08/07/18 1049     08/06/18 2200  ceFAZolin (ANCEF) IVPB 1 g/50 mL premix  Status:  Discontinued     1 g 100 mL/hr over 30 Minutes Intravenous Every 24 hr x 2 08/06/18 2144 08/07/18 1049   08/06/18 1200  ceFEPIme (MAXIPIME) 2 g in sodium chloride 0.9 % 100 mL IVPB  Status:  Discontinued     2 g 200 mL/hr over 30 Minutes Intravenous Every M-W-F (Hemodialysis) 08/05/18 2124 08/06/18 2131   08/06/18 1200  vancomycin (VANCOCIN) IVPB 500 mg/100 ml premix  Status:  Discontinued     500 mg 100 mL/hr over 60 Minutes Intravenous Every M-W-F (Hemodialysis) 08/05/18 2124 08/07/18 1047   08/05/18 2200  vancomycin (VANCOCIN) 1,250 mg in sodium chloride 0.9 % 250 mL IVPB     1,250 mg 166.7 mL/hr over 90 Minutes Intravenous  Once 08/05/18 2108 08/06/18 0123   08/05/18 2130  ceFEPIme (MAXIPIME) 1 g in sodium chloride 0.9 % 100 mL IVPB     1 g 200 mL/hr over 30 Minutes Intravenous  Once 08/05/18 2043 08/06/18 0002   08/05/18 2045  vancomycin (VANCOCIN) 1,178 mg in sodium chloride 0.9 % 500 mL IVPB  Status:  Discontinued     20 mg/kg  58.9 kg 250 mL/hr over 120 Minutes Intravenous  Once 08/05/18 2043 08/05/18 2108      Subjective: Without complaints at this time  Objective: Vitals:   08/06/18 1831 08/06/18 2055 08/07/18 0519 08/07/18 0812  BP: (!) 120/55 (!) 118/49 126/62 (!) 144/67  Pulse: 76 90 70 70  Resp: 18 18 18 18   Temp: (!) 97.5 F (36.4 C) 98.6 F (37 C) 98.2 F (36.8 C) 98.1 F (36.7 C)  TempSrc: Oral Oral Oral Oral  SpO2: 99% 100% 100% 100%  Weight:  61.1 kg    Height:        Intake/Output Summary (Last 24 hours) at 08/07/2018 1558 Last data filed at 08/07/2018 1400 Gross per 24 hour  Intake 760 ml  Output 150 ml  Net 610 ml   Filed Weights   08/06/18 0024 08/06/18 1434 08/06/18 2055  Weight: 61.1 kg 61.1 kg 61.1 kg    Examination: General exam: Awake, laying in bed, in nad Respiratory system: Normal  respiratory effort, no wheezing Cardiovascular system: regular rate, s1, s2 Gastrointestinal system: Soft, nondistended, positive BS Central nervous system: CN2-12 grossly intact, strength intact Extremities: Perfused, no clubbing Skin: Normal skin turgor, no notable skin lesions seen Psychiatry: Mood normal // no visual hallucinations   Data Reviewed: I have personally reviewed following labs and imaging studies  CBC: Recent Labs  Lab 08/01/18 0944 08/05/18 1949 08/06/18 0503 08/06/18 1435  WBC 7.5 7.5 6.3  --   NEUTROABS  --  5.4  --   --   HGB 7.6* 6.8* 6.3* 8.8*  HCT 23.6* 20.8* 19.5* 26.0*  MCV 102.2* 105.1* 103.7*  --   PLT 123* 165 155  --    Basic Metabolic Panel: Recent Labs  Lab 08/01/18 0942 08/01/18 0944 08/05/18 1949 08/05/18 2348 08/06/18 0503  08/06/18 1435 08/07/18 0735  NA 129*  --  132*  --  133* 138 134*  K 2.8*  --  3.2*  --  3.6 3.8 4.3  CL 95*  --  94*  --  97*  --  100  CO2 20*  --  26  --  25  --  19*  GLUCOSE 218*  --  320*  --  225* 95 160*  BUN 47*  --  31*  --  33*  --  39*  CREATININE 8.51*  --  9.33*  --  9.81*  --  11.41*  CALCIUM 7.2* 7.1* 7.2*  --  7.1*  --  7.2*  MG  --   --   --  2.0  --   --   --   PHOS 2.9  --   --   --  3.8  --  4.9*   GFR: Estimated Creatinine Clearance: 5.4 mL/min (A) (by C-G formula based on SCr of 11.41 mg/dL (H)). Liver Function Tests: Recent Labs  Lab 08/01/18 0942 08/05/18 1949 08/06/18 0503 08/07/18 0735  AST  --  33  --   --   ALT  --  <5  --   --   ALKPHOS  --  69  --   --   BILITOT  --  0.4  --   --   PROT  --  6.6  --   --   ALBUMIN 2.4* 2.4* 2.3* 2.2*   No results for input(s): LIPASE, AMYLASE in the last 168 hours. No results for input(s): AMMONIA in the last 168 hours. Coagulation Profile: No results for input(s): INR, PROTIME in the last 168 hours. Cardiac Enzymes: No results for input(s): CKTOTAL, CKMB, CKMBINDEX, TROPONINI in the last 168 hours. BNP (last 3 results) No results  for input(s): PROBNP in the last 8760 hours. HbA1C: Recent Labs    08/06/18 0503  HGBA1C 8.4*   CBG: Recent Labs  Lab 08/06/18 1752 08/06/18 1830 08/06/18 2055 08/07/18 0814 08/07/18 1149  GLUCAP 84 91 232* 160* 143*   Lipid Profile: No results for input(s): CHOL, HDL, LDLCALC, TRIG, CHOLHDL, LDLDIRECT in the last 72 hours. Thyroid Function Tests: No results for input(s): TSH, T4TOTAL, FREET4, T3FREE, THYROIDAB in the last 72 hours. Anemia Panel: Recent Labs    08/05/18 2108 08/05/18 2348  VITAMINB12  --  932*  FOLATE 10.6  --   FERRITIN  --  1,083*  TIBC  --  151*  IRON  --  23*  RETICCTPCT  --  2.2   Sepsis Labs: Recent Labs  Lab 08/05/18 1957  LATICACIDVEN 1.56    Recent Results (from the past 240 hour(s))  Culture, blood (routine x 2)     Status: None   Collection Time: 07/29/18  5:00 PM  Result Value Ref Range Status   Specimen Description BLOOD RIGHT HAND  Final   Special Requests   Final    BOTTLES DRAWN AEROBIC ONLY Blood Culture adequate volume   Culture   Final    NO GROWTH 5 DAYS Performed at [facility], [place] [address] [place], [place] [address]    Report Status 08/03/2018 FINAL  Final  Culture, blood (routine x 2)     Status: None   Collection Time: 07/29/18  5:07 PM  Result Value Ref Range Status   Specimen Description BLOOD RIGHT FOREARM  Final   Special Requests   Final    BOTTLES DRAWN AEROBIC ONLY Blood Culture  adequate volume   Culture   Final    NO GROWTH 5 DAYS Performed at [facility], [place] [address] [place], [place] [address]    Report Status 08/03/2018 FINAL  Final  Blood culture (routine x 2)     Status: None (Preliminary result)   Collection Time: 08/05/18  9:22 PM  Result Value Ref Range Status   Specimen Description BLOOD RIGHT HAND  Final   Special Requests   Final    BOTTLES DRAWN AEROBIC ONLY Blood Culture adequate volume   Culture   Final    NO GROWTH 2 DAYS Performed at [facility],  [place] [address] [place], [place] [address]    Report Status PENDING  Incomplete  Blood culture (routine x 2)     Status: None (Preliminary result)   Collection Time: 08/05/18 11:30 PM  Result Value Ref Range Status   Specimen Description SITE NOT SPECIFIED  Final   Special Requests   Final    BOTTLES DRAWN AEROBIC AND ANAEROBIC Blood Culture adequate volume   Culture   Final    NO GROWTH 1 DAY Performed at [facility], [place] [address] [place], [place] [address]    Report Status PENDING  Incomplete  Aerobic/Anaerobic Culture (surgical/deep wound)     Status: None (Preliminary result)   Collection Time: 08/06/18  5:04 PM  Result Value Ref Range Status   Specimen Description WOUND LEFT ARM GRAFT  Final   Special Requests VANCOMYCON AND MAXIPINE  Final   Gram Stain   Final    FEW WBC PRESENT, PREDOMINANTLY PMN RARE GRAM POSITIVE COCCI    Culture   Final    NO GROWTH < 24 HOURS Performed at [facility], [place] [address], [place], [place] [address]    Report Status PENDING  Incomplete     Radiology Studies: [place] Soft Tissue Non Vascular  Result Date: 08/06/2018 CLINICAL DATA:  Infection of AV graft. EXAM: ULTRASOUND LEFT UPPER EXTREMITY LIMITED TECHNIQUE: Ultrasound examination of the upper extremity soft tissues was performed in the area of clinical concern. COMPARISON:  None FINDINGS: Ultrasound performed in the left forearm in the area of concern. There is a large heterogeneous hypoechoic mass noted surrounding the left forearm AV graft. This measures 4.0 x 3.9 x 3.6 cm. Burtis Junes this representing a large hematoma. Infection cannot be excluded. IMPRESSION: Large hypoechoic area surrounding the left forearm AV graft, favor hematoma although infection cannot be excluded. Electronically Signed   By: Rolm Baptise M.D.   On: 08/06/2018 02:54    Scheduled Meds: . aspirin  81 mg Oral Daily  . Chlorhexidine Gluconate Cloth  6 each Topical Q0600  . insulin aspart   0-9 Units Subcutaneous TID WC  . insulin glargine  15 Units Subcutaneous Daily  . multivitamin with minerals  1 tablet Oral Daily  . pantoprazole  40 mg Oral BID   Continuous Infusions: .  ceFAZolin (ANCEF) IV       LOS: 2 days   Marylu Lund, MD [facility] Pager On Amion  If 7PM-7AM, please contact night-coverage 08/07/2018, 3:58 PM

## 2018-08-07 NOTE — Progress Notes (Signed)
   08/07/18 1300  Clinical Encounter Type  Visited With Patient;Family  Visit Type Initial  Referral From Other (Comment)  Spiritual Encounters  Spiritual Needs Other (Comment)  Stress Factors  Patient Stress Factors None identified  Family Stress Factors None identified   Chaplain was informed that PT might need some information on an AD. PT was alert and family was at bedside. I offered information on the AD, PT was not interested at this time and will alert Nurse if PT changes his mind.   Chaplain Fidel Levy [phone]

## 2018-08-07 NOTE — Plan of Care (Signed)
  Problem: Pain Managment: Goal: General experience of comfort will improve Outcome: Progressing   

## 2018-08-07 NOTE — Consult Note (Signed)
[facility] for Infectious Disease    Date of Admission:  08/05/2018     Total days of antibiotics                Reason for Consult:    Referring Provider:  Primary Care Provider: [facility]   Assessment/Plan:  [name] is a 67 year old male who is readmitted for atrial venous fistula infection following MSSA bacteremia of unclear origin 1 week ago.  Now status post excision of the atria venous fistula graft and doing well with no further fevers or leukocytosis.  Surgical dressing remains intact and clean and dry.  Repeat blood cultures are without growth to date.  Surgical cultures with no growth less than 24 hours and Gram stain with gram-positive cocci likely MSSA although he was receiving cefazolin.  Plan will be to continue cefazolin resetting the treatment time and now will be 6 weeks secondary to repeat infection despite source control with removal of the fistula.  1.  Continue current dose of cefazolin with goal of 6 weeks of therapy with hemodialysis.  New end date of 09/18/2018. 2.  Continue to monitor cultures, fever curve, and white blood cell count. 3.  OPAT orders placed. 4.  Follow-up in the ID office in 4 weeks.  Diagnosis: MSSA AV fistula infection / bacteremia  Culture Result: MSSA  No Known Allergies  OPAT Orders Discharge antibiotics: Cefazolin with HD Per pharmacy protocol   Duration: 6 weeks  End Date: 09/18/18  [facility] Care Per Protocol:  Labs weekly while on IV antibiotics: _X_ CBC with differential __ BMP _X_ CMP __ CRP __ ESR __ Vancomycin trough __ CK  __ Please pull PIC at completion of IV antibiotics __ Please leave PIC in place until doctor has seen patient or been notified  Fax weekly labs to [phone]  Clinic Follow Up Appt:  09/15/18 @ 9:30 am with Terri Piedra, NP   Principal Problem:   Infection of AV graft for dialysis [facility]) Active Problems:   Type 2 diabetes mellitus with chronic kidney disease  on chronic dialysis ([place])   HTN (hypertension)   Bacteremia due to methicillin susceptible Staphylococcus aureus (MSSA)   ESRD (end stage renal disease) ([place])   Symptomatic anemia   Hypokalemia   GERD (gastroesophageal reflux disease)   . aspirin  81 mg Oral Daily  . Chlorhexidine Gluconate Cloth  6 each Topical Q0600  . insulin aspart  0-9 Units Subcutaneous TID WC  . insulin glargine  15 Units Subcutaneous Daily  . multivitamin with minerals  1 tablet Oral Daily  . pantoprazole  40 mg Oral BID     HPI: Roger Vazquez is a 67 y.o. male with previous medical history of ESRD on hemodialysis, hypertension, and recent hospital admission for MSSA bacteremia with unclear source presenting back to the hospital for readmission for evaluation of left arm swelling at the site of his AV graft.  Swelling started on Monday after leaving the hospital on Saturday following dialysis.  Reports having fevers of 102 degrees at home and feeling weak.  He has been receiving his cefazolin with dialysis.  In the ED found to have a temperature of 100.5 with no leukocytosis and no lactic acidosis.  He is now postop day 1 status post excision of infected left upper extremity AV graft.  Gross pus was encountered around the atrial venous anastomosis which was sent for culture and sensitivity.  He was debrided and transected.  Surgical cultures  with no growth in less than 24 hours and Gram stain with gram-positive cocci.  Repeat blood cultures are without growth to date.  Mr. Wildes feels better today with some mild arm soreness but denies fevers, chills, or sweats.  He is in good spirits.   Review of Systems: Review of Systems  Constitutional: Negative for chills and fever.  Respiratory: Negative for cough, shortness of breath and wheezing.   Cardiovascular: Negative for chest pain.  Gastrointestinal: Negative for abdominal pain, constipation, diarrhea, nausea and vomiting.  Skin: Negative for rash.    Neurological: Negative for weakness and headaches.     Past Medical History:  Diagnosis Date  . Diabetes mellitus   . Hypertension   . Renal disorder     Social History   Tobacco Use  . Smoking status: Former Smoker    Packs/day: 0.50  . Smokeless tobacco: Former Systems developer    Quit date: 08/01/2015  Substance Use Topics  . Alcohol use: Yes    Alcohol/week: 9.0 standard drinks    Types: 9 Cans of beer per week  . Drug use: Yes    Types: Marijuana    History reviewed. No pertinent family history.  No Known Allergies  OBJECTIVE: Blood pressure (!) 141/51, pulse 74, temperature 98.7 F (37.1 C), temperature source Oral, resp. rate 18, height 5' 6"  (1.676 m), weight 61.1 kg, SpO2 100 %.  Physical Exam  Constitutional: He is oriented to person, place, and time. He appears well-developed and well-nourished. No distress.  Cardiovascular: Normal rate, regular rhythm, normal heart sounds and intact distal pulses. Exam reveals no gallop and no friction rub.  No murmur heard. Pulmonary/Chest: Effort normal and breath sounds normal. He has no wheezes. He has no rales. He exhibits no tenderness.  Abdominal: Soft. Bowel sounds are normal. He exhibits no distension. There is no tenderness. There is no rebound and no guarding.  Neurological: He is alert and oriented to person, place, and time.  Skin: Skin is warm and dry.  Psychiatric: He has a normal mood and affect. His behavior is normal. Judgment and thought content normal.    Lab Results Lab Results  Component Value Date   WBC 6.3 08/06/2018   HGB 8.8 (L) 08/06/2018   HCT 26.0 (L) 08/06/2018   MCV 103.7 (H) 08/06/2018   PLT 155 08/06/2018    Lab Results  Component Value Date   CREATININE 11.41 (H) 08/07/2018   BUN 39 (H) 08/07/2018   NA 134 (L) 08/07/2018   K 4.3 08/07/2018   CL 100 08/07/2018   CO2 19 (L) 08/07/2018    Lab Results  Component Value Date   ALT <5 08/05/2018   AST 33 08/05/2018   ALKPHOS 69 08/05/2018    BILITOT 0.4 08/05/2018     Microbiology: Recent Results (from the past 240 hour(s))  Culture, blood (routine x 2)     Status: None   Collection Time: 07/29/18  5:00 PM  Result Value Ref Range Status   Specimen Description BLOOD RIGHT HAND  Final   Special Requests   Final    BOTTLES DRAWN AEROBIC ONLY Blood Culture adequate volume   Culture   Final    NO GROWTH 5 DAYS Performed at [facility], [place] [address] [place], [place] [address]    Report Status 08/03/2018 FINAL  Final  Culture, blood (routine x 2)     Status: None   Collection Time: 07/29/18  5:07 PM  Result Value Ref Range Status  Specimen Description BLOOD RIGHT FOREARM  Final   Special Requests   Final    BOTTLES DRAWN AEROBIC ONLY Blood Culture adequate volume   Culture   Final    NO GROWTH 5 DAYS Performed at [facility], [address] [place], [place] [address]    Report Status 08/03/2018 FINAL  Final  Blood culture (routine x 2)     Status: None (Preliminary result)   Collection Time: 08/05/18  9:22 PM  Result Value Ref Range Status   Specimen Description BLOOD RIGHT HAND  Final   Special Requests   Final    BOTTLES DRAWN AEROBIC ONLY Blood Culture adequate volume   Culture   Final    NO GROWTH 2 DAYS Performed at [facility], [place] [address] [place], [place] [address]    Report Status PENDING  Incomplete  Blood culture (routine x 2)     Status: None (Preliminary result)   Collection Time: 08/05/18 11:30 PM  Result Value Ref Range Status   Specimen Description SITE NOT SPECIFIED  Final   Special Requests   Final    BOTTLES DRAWN AEROBIC AND ANAEROBIC Blood Culture adequate volume   Culture   Final    NO GROWTH 1 DAY Performed at [facility], [place] [address], [place], [place] [address]    Report Status PENDING  Incomplete  Aerobic/Anaerobic Culture (surgical/deep wound)     Status: None (Preliminary result)   Collection Time: 08/06/18  5:04 PM  Result Value Ref Range  Status   Specimen Description WOUND LEFT ARM GRAFT  Final   Special Requests VANCOMYCON AND MAXIPINE  Final   Gram Stain   Final    FEW WBC PRESENT, PREDOMINANTLY PMN RARE GRAM POSITIVE COCCI    Culture   Final    NO GROWTH < 24 HOURS Performed at [facility], [place] [address], [place], [place] [address]    Report Status PENDING  Incomplete     Terri Piedra, [place] [facility] Pager  08/07/2018  5:28 PM

## 2018-08-07 NOTE — Progress Notes (Signed)
PHARMACY CONSULT NOTE FOR:  OUTPATIENT  PARENTERAL ANTIBIOTIC THERAPY (OPAT)  Indication: MSSA bacteremia Regimen: Cefazolin 2 gm w/ HD MWF End date: updated end date:  September 18, 2018  IV antibiotic discharge orders are pended. To discharging provider:  please sign these orders via discharge navigator,  Select New Orders & click on the button choice - Manage This Unsigned Work.      Thank you for allowing [place] to participate in this patients care.  Nicole Cella, RPh Please utilize Amion (under [place]) for appropriate number for your unit pharmacist. 08/07/2018 8:02 PM

## 2018-08-07 NOTE — Consult Note (Signed)
   [facility] CM Inpatient Consult   08/07/2018  CAROLOS FECHER [birth year] [id number]   Patient screened for high risk for unplanned readmissionl [place] Management services. Patient is in the [place] [facility] the [place] Management services under patient's [facility] Medicare PPO plan. His current primary care provider is listed as Lovena Le [facility]].   Patient is currently awaiting his dialysis graft schedule currently for tomorrow. Will follow up for disposition and needs.   For questions contact:   Natividad Brood, RN BSN [facility] Liaison  [phone] business mobile phone Toll free office [phone]

## 2018-08-07 NOTE — Progress Notes (Addendum)
[facility] Progress Note   Subjective: Eating breakfast, No C/Os. Feels much better.     Objective Vitals:   08/06/18 1831 08/06/18 2055 08/07/18 0519 08/07/18 0812  BP: (!) 120/55 (!) 118/49 126/62 (!) 144/67  Pulse: 76 90 70 70  Resp: 18 18 18 18   Temp: (!) 97.5 F (36.4 C) 98.6 F (37 C) 98.2 F (36.8 C) 98.1 F (36.7 C)  TempSrc: Oral Oral Oral Oral  SpO2: 99% 100% 100% 100%  Weight:  61.1 kg    Height:       Physical Exam General: Pleasant elderly male in NAD Heart: S1,S2, RRR Lungs:CTAB A/P Abdomen: active BS Extremities: No LE edema Dialysis Access: No access. ACE wrap LUA, CDI.    Additional Objective Labs: Basic Metabolic Panel: Recent Labs  Lab 08/01/18 0942  08/05/18 1949 08/06/18 0503 08/06/18 1435 08/07/18 0735  NA 129*  --  132* 133* 138 134*  K 2.8*  --  3.2* 3.6 3.8 4.3  CL 95*  --  94* 97*  --  100  CO2 20*  --  26 25  --  19*  GLUCOSE 218*  --  320* 225* 95 160*  BUN 47*  --  31* 33*  --  39*  CREATININE 8.51*  --  9.33* 9.81*  --  11.41*  CALCIUM 7.2*   < > 7.2* 7.1*  --  7.2*  PHOS 2.9  --   --  3.8  --  4.9*   < > = values in this interval not displayed.   Liver Function Tests: Recent Labs  Lab 08/05/18 1949 08/06/18 0503 08/07/18 0735  AST 33  --   --   ALT <5  --   --   ALKPHOS 69  --   --   BILITOT 0.4  --   --   PROT 6.6  --   --   ALBUMIN 2.4* 2.3* 2.2*   No results for input(s): LIPASE, AMYLASE in the last 168 hours. CBC: Recent Labs  Lab 08/01/18 0944 08/05/18 1949 08/06/18 0503 08/06/18 1435  WBC 7.5 7.5 6.3  --   NEUTROABS  --  5.4  --   --   HGB 7.6* 6.8* 6.3* 8.8*  HCT 23.6* 20.8* 19.5* 26.0*  MCV 102.2* 105.1* 103.7*  --   PLT 123* 165 155  --    Blood Culture    Component Value Date/Time   SDES WOUND LEFT ARM GRAFT 08/06/2018 1704   [place] 08/06/2018 1704   CULT PENDING 08/06/2018 1704   REPTSTATUS PENDING 08/06/2018 1704    Cardiac Enzymes: No  results for input(s): CKTOTAL, CKMB, CKMBINDEX, TROPONINI in the last 168 hours. CBG: Recent Labs  Lab 08/06/18 1204 08/06/18 1752 08/06/18 1830 08/06/18 2055 08/07/18 0814  GLUCAP 152* 84 91 232* 160*   Iron Studies:  Recent Labs    08/05/18 2348  IRON 23*  TIBC 151*  FERRITIN 1,083*   @lablastinr3 @ Studies/Results: [place] Lt Upper Extrem Ltd Soft Tissue Non Vascular  Result Date: 08/06/2018 CLINICAL DATA:  Infection of AV graft. EXAM: ULTRASOUND LEFT UPPER EXTREMITY LIMITED TECHNIQUE: Ultrasound examination of the upper extremity soft tissues was performed in the area of clinical concern. COMPARISON:  None FINDINGS: Ultrasound performed in the left forearm in the area of concern. There is a large heterogeneous hypoechoic mass noted surrounding the left forearm AV graft. This measures 4.0 x 3.9 x 3.6 cm. Burtis Junes this representing a large hematoma. Infection cannot be excluded.  IMPRESSION: Large hypoechoic area surrounding the left forearm AV graft, favor hematoma although infection cannot be excluded. Electronically Signed   By: Rolm Baptise M.D.   On: 08/06/2018 02:54   Medications: .  ceFAZolin (ANCEF) IV 1 g (08/07/18 0128)   . aspirin  81 mg Oral Daily  . insulin aspart  0-9 Units Subcutaneous TID WC  . insulin glargine  15 Units Subcutaneous Daily  . multivitamin with minerals  1 tablet Oral Daily  . pantoprazole  40 mg Oral BID  . vancomycin  500 mg Intravenous Q M,W,F-HD    HD Orders: [place] MWF 3 hr 45 min 180 NRe 400/Autoflow 1.5 59 kg 3.0 K/2.5 Ca UFP 4  LUA AVG -Heparin 4000 units IV TIW -Mircera 200 mcg IV q 2 weeks (Last dose 07/16/2018 HGB 10 07/25/18)  Assessment/Plan: 1. Suspected infected AVG/SP AVG removal-Infected graft removed 08/07/2018. Access holiday, will need TDC placed tomorrow. ID following, continue on Ancef/Vanc. Repeat [facility] 10/22 NG 24 hrs.  2. ESRD -MWF-plan for HD tomorrow after [facility] placed. K+ 4.3 today.  3. Anemia - HGB noted to be 6.3. S/P 2  unit PRBCs . HGB 8.8. Give Aranesp 200 mcg IV with HD Friday.  4. Secondary hyperparathyroidism - Ca 7.2 C Ca 8.4. Phos 4.9. No binders, VDRA on OP med list.  5. HTN/volume - BP controlled, no evidence of volume overload by exam. Monitor volume closely. Left HD 2.4 kg above OP EDW 10/21. Wt here 61.1 kg. Attempt 2-2.5 liters in HD 6. Nutrition - Albumin 2.2 Renal/Carb mod diet, prostat and renal vit when able to eat.  7. DM-per primary  Roger Vazquez. Brown NP-C 08/07/2018, 9:27 AM  [employer] [phone]  I have seen and examined this patient and agree with plan and assessment in the above note with renal recommendations/intervention highlighted.  Feels better will need TDC or temp HD cath tomorrow for HD. Governor Rooks Ralf Konopka,MD 08/07/2018 12:47 PM

## 2018-08-07 NOTE — Anesthesia Postprocedure Evaluation (Signed)
Anesthesia Post Note  Patient: Roger Vazquez  Procedure(s) Performed: REMOVAL OF LEFT ARM GRAFT (Left Arm Upper)     Patient location during evaluation: PACU Anesthesia Type: General Level of consciousness: awake and alert Pain management: pain level controlled Vital Signs Assessment: post-procedure vital signs reviewed and stable Respiratory status: spontaneous breathing, nonlabored ventilation, respiratory function stable and patient connected to nasal cannula oxygen Cardiovascular status: blood pressure returned to baseline and stable Postop Assessment: no apparent nausea or vomiting Anesthetic complications: no    Last Vitals:  Vitals:   08/07/18 0519 08/07/18 0812  BP: 126/62 (!) 144/67  Pulse: 70 70  Resp: 18 18  Temp: 36.8 C 36.7 C  SpO2: 100% 100%    Last Pain:  Vitals:   08/07/18 0812  TempSrc: Oral  PainSc: 8                  Tiajuana Amass

## 2018-08-08 ENCOUNTER — Inpatient Hospital Stay (HOSPITAL_COMMUNITY): Payer: Medicare PPO | Admitting: Anesthesiology

## 2018-08-08 ENCOUNTER — Encounter (HOSPITAL_COMMUNITY): Payer: Self-pay | Admitting: Anesthesiology

## 2018-08-08 ENCOUNTER — Encounter (HOSPITAL_COMMUNITY)
Admission: EM | Disposition: A | Discharge status: Home Health | Payer: Self-pay | Source: Home / Self Care | Attending: Internal Medicine

## 2018-08-08 ENCOUNTER — Inpatient Hospital Stay (HOSPITAL_COMMUNITY): Payer: Medicare PPO

## 2018-08-08 DIAGNOSIS — Z466 Encounter for fitting and adjustment of urinary device: Secondary | ICD-10-CM

## 2018-08-08 HISTORY — PX: INSERTION OF DIALYSIS CATHETER: SHX1324

## 2018-08-08 LAB — GLUCOSE, CAPILLARY
GLUCOSE-CAPILLARY: 136 mg/dL — AB (ref 70–99)
GLUCOSE-CAPILLARY: 91 mg/dL (ref 70–99)
Glucose-Capillary: 73 mg/dL (ref 70–99)
Glucose-Capillary: 105 mg/dL — ABNORMAL HIGH (ref 70–99)
Glucose-Capillary: 63 mg/dL — ABNORMAL LOW (ref 70–99)

## 2018-08-08 LAB — RENAL FUNCTION PANEL
Albumin: 2.2 g/dL — ABNORMAL LOW (ref 3.5–5.0)
Anion gap: 12 (ref 5–15)
BUN: 44 mg/dL — ABNORMAL HIGH (ref 8–23)
CO2: 21 mmol/L — ABNORMAL LOW (ref 22–32)
Calcium: 6.8 mg/dL — ABNORMAL LOW (ref 8.9–10.3)
Chloride: 103 mmol/L (ref 98–111)
Creatinine, Ser: 12.48 mg/dL — ABNORMAL HIGH (ref 0.61–1.24)
GFR calc Af Amer: 4 mL/min — ABNORMAL LOW (ref >60)
GFR calc non Af Amer: 4 mL/min — ABNORMAL LOW (ref >60)
Glucose, Bld: 161 mg/dL — ABNORMAL HIGH (ref 70–99)
Phosphorus: 5.1 mg/dL — ABNORMAL HIGH (ref 2.5–4.6)
Potassium: 3.8 mmol/L (ref 3.5–5.1)
Sodium: 136 mmol/L (ref 135–145)

## 2018-08-08 LAB — CBC
HEMATOCRIT: 23.3 % — AB (ref 39.0–52.0)
Hemoglobin: 7.5 g/dL — ABNORMAL LOW (ref 13.0–17.0)
MCH: 33.6 pg (ref 26.0–34.0)
MCHC: 32.2 g/dL (ref 30.0–36.0)
MCV: 104.5 fL — ABNORMAL HIGH (ref 80.0–100.0)
Platelets: 176 10*3/uL (ref 150–400)
RBC: 2.23 MIL/uL — AB (ref 4.22–5.81)
RDW: 14.5 % (ref 11.5–15.5)
WBC: 5.9 10*3/uL (ref 4.0–10.5)
nRBC: 0 % (ref 0.0–0.2)

## 2018-08-08 LAB — SURGICAL PCR SCREEN
MRSA, PCR: NEGATIVE
STAPHYLOCOCCUS AUREUS: NEGATIVE

## 2018-08-08 SURGERY — INSERTION OF DIALYSIS CATHETER
Anesthesia: Monitor Anesthesia Care | Site: Chest | Laterality: Right

## 2018-08-08 MED ORDER — MIDAZOLAM HCL 5 MG/5ML IJ SOLN
INTRAMUSCULAR | Status: DC | PRN
  Administered 2018-08-08 (×2): 1 mg via INTRAVENOUS

## 2018-08-08 MED ORDER — LACTATED RINGERS IV SOLN: INTRAVENOUS | Status: DC

## 2018-08-08 MED ORDER — SODIUM CHLORIDE 0.9 % IV SOLN
INTRAVENOUS | Status: DC | PRN
  Administered 2018-08-08: 500 mL

## 2018-08-08 MED ORDER — LIDOCAINE HCL (CARDIAC) PF 100 MG/5ML IV SOSY
PREFILLED_SYRINGE | INTRAVENOUS | Status: DC | PRN
  Administered 2018-08-08: 50 mg via INTRATRACHEAL

## 2018-08-08 MED ORDER — SODIUM CHLORIDE 0.9 % IV SOLN
INTRAVENOUS | Status: AC
  Filled 2018-08-08: qty 1.2

## 2018-08-08 MED ORDER — MIDAZOLAM HCL 2 MG/2ML IJ SOLN
INTRAMUSCULAR | Status: AC
  Filled 2018-08-08: qty 2

## 2018-08-08 MED ORDER — PROPOFOL 10 MG/ML IV BOLUS
INTRAVENOUS | Status: AC
  Filled 2018-08-08: qty 20

## 2018-08-08 MED ORDER — SODIUM CHLORIDE 0.9 % IV SOLN
INTRAVENOUS | Status: DC | PRN
  Administered 2018-08-08: 07:00:00 via INTRAVENOUS

## 2018-08-08 MED ORDER — LIDOCAINE-EPINEPHRINE (PF) 1 %-1:200000 IJ SOLN
INTRAMUSCULAR | Status: AC
  Filled 2018-08-08: qty 30

## 2018-08-08 MED ORDER — HEPARIN SODIUM (PORCINE) 1000 UNIT/ML IJ SOLN
INTRAMUSCULAR | Status: DC | PRN
  Administered 2018-08-08: 3000 [IU] via INTRAVENOUS

## 2018-08-08 MED ORDER — HEPARIN SODIUM (PORCINE) 1000 UNIT/ML IJ SOLN
INTRAMUSCULAR | Status: AC
  Filled 2018-08-08: qty 3

## 2018-08-08 MED ORDER — FENTANYL CITRATE (PF) 250 MCG/5ML IJ SOLN
INTRAMUSCULAR | Status: DC | PRN
  Administered 2018-08-08: 50 ug via INTRAVENOUS

## 2018-08-08 MED ORDER — HEPARIN SODIUM (PORCINE) 1000 UNIT/ML DIALYSIS: 20.0000 [IU]/kg | INTRAMUSCULAR | Status: DC | PRN

## 2018-08-08 MED ORDER — FENTANYL CITRATE (PF) 250 MCG/5ML IJ SOLN
INTRAMUSCULAR | Status: AC
  Filled 2018-08-08: qty 5

## 2018-08-08 MED ORDER — 0.9 % SODIUM CHLORIDE (POUR BTL) OPTIME
TOPICAL | Status: DC | PRN
  Administered 2018-08-08: 1000 mL

## 2018-08-08 MED ORDER — HEPARIN SODIUM (PORCINE) 1000 UNIT/ML IJ SOLN
INTRAMUSCULAR | Status: AC
  Filled 2018-08-08: qty 1

## 2018-08-08 MED ORDER — PROPOFOL 10 MG/ML IV BOLUS
INTRAVENOUS | Status: DC | PRN
  Administered 2018-08-08: 20 mg via INTRAVENOUS
  Administered 2018-08-08: 10 mg via INTRAVENOUS

## 2018-08-08 MED ORDER — SODIUM CHLORIDE 0.9 % IV SOLN: 100.0000 mL | INTRAVENOUS | Status: DC | PRN

## 2018-08-08 MED ORDER — FENTANYL CITRATE (PF) 100 MCG/2ML IJ SOLN: 25.0000 ug | INTRAMUSCULAR | Status: DC | PRN

## 2018-08-08 MED ORDER — LIDOCAINE-EPINEPHRINE (PF) 1 %-1:200000 IJ SOLN
INTRAMUSCULAR | Status: DC | PRN
  Administered 2018-08-08: 7 mL

## 2018-08-08 MED ORDER — LIDOCAINE HCL (PF) 1 % IJ SOLN: 5.0000 mL | INTRAMUSCULAR | Status: DC | PRN

## 2018-08-08 MED ORDER — PROMETHAZINE HCL 25 MG/ML IJ SOLN: 6.2500 mg | INTRAMUSCULAR | Status: DC | PRN

## 2018-08-08 SURGICAL SUPPLY — 54 items
ADH SKN CLS APL DERMABOND .7 (GAUZE/BANDAGES/DRESSINGS) ×1
BAG DECANTER FOR FLEXI CONT (MISCELLANEOUS) ×3 IMPLANT
BIOPATCH RED 1 DISK 7.0 (GAUZE/BANDAGES/DRESSINGS) ×2 IMPLANT
BIOPATCH RED 1IN DISK 7.0MM (GAUZE/BANDAGES/DRESSINGS) ×1
CATH PALINDROME RT-P 15FX19CM (CATHETERS) ×2 IMPLANT
CATH PALINDROME RT-P 15FX23CM (CATHETERS) IMPLANT
CATH PALINDROME RT-P 15FX28CM (CATHETERS) IMPLANT
CATH PALINDROME RT-P 15FX55CM (CATHETERS) IMPLANT
CATH STRAIGHT 5FR 65CM (CATHETERS) IMPLANT
COVER PROBE W GEL 5X96 (DRAPES) ×3 IMPLANT
COVER SURGICAL LIGHT HANDLE (MISCELLANEOUS) ×3 IMPLANT
COVER WAND RF STERILE (DRAPES) ×3 IMPLANT
DECANTER SPIKE VIAL GLASS SM (MISCELLANEOUS) ×3 IMPLANT
DERMABOND ADVANCED (GAUZE/BANDAGES/DRESSINGS) ×2
DERMABOND ADVANCED .7 DNX12 (GAUZE/BANDAGES/DRESSINGS) ×1 IMPLANT
DRAPE C-ARM 42X72 X-RAY (DRAPES) ×3 IMPLANT
DRAPE CHEST BREAST 15X10 FENES (DRAPES) ×3 IMPLANT
GAUZE 4X4 16PLY RFD (DISPOSABLE) ×3 IMPLANT
GAUZE SPONGE 4X4 12PLY STRL LF (GAUZE/BANDAGES/DRESSINGS) ×2 IMPLANT
GLOVE BIO SURGEON STRL SZ 6.5 (GLOVE) ×1 IMPLANT
GLOVE BIO SURGEON STRL SZ7.5 (GLOVE) ×3 IMPLANT
GLOVE BIO SURGEONS STRL SZ 6.5 (GLOVE) ×1
GLOVE BIOGEL PI IND STRL 6.5 (GLOVE) IMPLANT
GLOVE BIOGEL PI IND STRL 8 (GLOVE) ×1 IMPLANT
GLOVE BIOGEL PI INDICATOR 6.5 (GLOVE) ×4
GLOVE BIOGEL PI INDICATOR 8 (GLOVE) ×2
GOWN STRL REUS W/ TWL LRG LVL3 (GOWN DISPOSABLE) ×2 IMPLANT
GOWN STRL REUS W/ TWL XL LVL3 (GOWN DISPOSABLE) ×2 IMPLANT
GOWN STRL REUS W/TWL LRG LVL3 (GOWN DISPOSABLE) ×6
GOWN STRL REUS W/TWL XL LVL3 (GOWN DISPOSABLE) ×6
KIT BASIN OR (CUSTOM PROCEDURE TRAY) ×3 IMPLANT
KIT TURNOVER KIT B (KITS) ×3 IMPLANT
NDL 18GX1X1/2 (RX/OR ONLY) (NEEDLE) ×1 IMPLANT
NDL HYPO 25GX1X1/2 BEV (NEEDLE) ×1 IMPLANT
NDL PERC 18GX7CM (NEEDLE) IMPLANT
NEEDLE 18GX1X1/2 (RX/OR ONLY) (NEEDLE) ×3 IMPLANT
NEEDLE HYPO 25GX1X1/2 BEV (NEEDLE) ×3 IMPLANT
NEEDLE PERC 18GX7CM (NEEDLE) ×3 IMPLANT
NS IRRIG 1000ML POUR BTL (IV SOLUTION) ×3 IMPLANT
PACK SURGICAL SETUP 50X90 (CUSTOM PROCEDURE TRAY) ×3 IMPLANT
PAD ARMBOARD 7.5X6 YLW CONV (MISCELLANEOUS) ×6 IMPLANT
SET MICROPUNCTURE 5F STIFF (MISCELLANEOUS) IMPLANT
SOAP 2 % CHG 4 OZ (WOUND CARE) ×3 IMPLANT
SUT ETHILON 3 0 PS 1 (SUTURE) ×3 IMPLANT
SUT MNCRL AB 4-0 PS2 18 (SUTURE) ×3 IMPLANT
SYR 10ML LL (SYRINGE) ×3 IMPLANT
SYR 20CC LL (SYRINGE) ×6 IMPLANT
SYR 5ML LL (SYRINGE) ×3 IMPLANT
SYR CONTROL 10ML LL (SYRINGE) ×3 IMPLANT
TAPE CLOTH SURG 4X10 WHT LF (GAUZE/BANDAGES/DRESSINGS) ×2 IMPLANT
TOWEL GREEN STERILE (TOWEL DISPOSABLE) ×3 IMPLANT
TOWEL GREEN STERILE FF (TOWEL DISPOSABLE) ×3 IMPLANT
WATER STERILE IRR 1000ML POUR (IV SOLUTION) ×3 IMPLANT
WIRE AMPLATZ SS-J .035X180CM (WIRE) IMPLANT

## 2018-08-08 NOTE — Anesthesia Preprocedure Evaluation (Addendum)
Anesthesia Evaluation  Patient identified by MRN, date of birth, ID band Patient awake    Reviewed: Allergy & Precautions, NPO status , Patient's Chart, lab work & pertinent test results  Airway Mallampati: I  TM Distance: >3 FB Neck ROM: Full    Dental  (+) Edentulous Upper, Edentulous Lower   Pulmonary former smoker,     + decreased breath sounds      Cardiovascular hypertension, Pt. on medications  Rhythm:Regular Rate:Normal     Neuro/Psych PSYCHIATRIC DISORDERS    GI/Hepatic Neg liver ROS, GERD  Medicated,  Endo/Other  diabetes, Type 2, Insulin Dependent  Renal/GU ESRF and DialysisRenal disease     Musculoskeletal negative musculoskeletal ROS (+)   Abdominal Normal abdominal exam  (+)   Peds  Hematology   Anesthesia Other Findings   Reproductive/Obstetrics                            Lab Results  Component Value Date   WBC 6.3 08/06/2018   HGB 8.8 (L) 08/06/2018   HCT 26.0 (L) 08/06/2018   MCV 103.7 (H) 08/06/2018   PLT 155 08/06/2018   Lab Results  Component Value Date   CREATININE 11.41 (H) 08/07/2018   BUN 39 (H) 08/07/2018   NA 134 (L) 08/07/2018   K 4.3 08/07/2018   CL 100 08/07/2018   CO2 19 (L) 08/07/2018   Lab Results  Component Value Date   INR 1.08 07/31/2018   INR 0.99 07/27/2018   INR 0.97 10/07/2011   Echo:- Left ventricle: The cavity size was normal. Moderate LVH.   Systolic function was normal. The estimated ejection fraction was   in the range of 60% to 65%. Wall motion was normal; there were no   regional wall motion abnormalities. - Aortic valve: Structurally normal valve. Trileaflet. No evidence   of vegetation. - Aorta: Mild atheromatous disease. - Mitral valve: No evidence of vegetation. - Left atrium: Mildly dilated. No evidence of thrombus in the   atrial cavity or appendage. - Right atrium: No evidence of thrombus in the atrial cavity or  appendage. - Atrial septum: No defect or patent foramen ovale was identified. - Tricuspid valve: No evidence of vegetation. - Pulmonic valve: No evidence of vegetation. - Pericardium, extracardiac: A trivial pericardial effusion was   identified.   Anesthesia Physical Anesthesia Plan  ASA: III  Anesthesia Plan: MAC   Post-op Pain Management:    Induction: Intravenous  PONV Risk Score and Plan: 2 and Ondansetron and Propofol infusion  Airway Management Planned: Simple Face Mask and Natural Airway  Additional Equipment: None  Intra-op Plan:   Post-operative Plan:   Informed Consent:   Plan Discussed with: CRNA  Anesthesia Plan Comments:         Anesthesia Quick Evaluation

## 2018-08-08 NOTE — Progress Notes (Signed)
PROGRESS NOTE    Roger Vazquez  CHY:[id number] DOB: 08-Jul-1951 DOA: 08/05/2018 PCP: [facility]    Brief Narrative:  67 y.o. male with medical history significant of end-stage renal disease on MWF hemodialysis, hypertension, diabetes presenting to the hospital for further evaluation of left arm swelling at the site of his AV graft.  Family states patient was discharged from the hospital on Saturday.  He then went to dialysis on Monday (yesterday) and it was noted that his left arm was swollen.  As such, patient was advised to come into the hospital for further evaluation.  He goes for hemodialysis to the [facility].  Patient has had a poor appetite since yesterday and had a fever of 102 F at home today.  He has been feeling weak. Denies having any nausea or vomiting.  He is complaining of pain in his left arm at the site of the AV graft.  Patient was admitted from 10/13-10/19 for sepsis with MSSA bacteremia, unclear source.  C. difficile panel negative for diarrhea.  Left arm dialysis graft site was stable on exam.  He was treated with Ancef and ID was on board.  He had stable TTE and TEE.  Left AV graft ultrasound was also stable.  He was discharged with IV cefazolin for 10 more days of his dialysis treatments (stop date October 31).   ED Course: Temperature 100.5 F.  Remainder of vitals stable.  No leukocytosis.  Lactic acid normal.  Hemoglobin 6.8; was 7.6 on discharge 4 days ago.  Chest x-ray showing no acute finding.  2 units of packed red blood cells ordered in the ED.  TRH patient admitted.  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  No tachycardia or hypotension. No leukocytosis.  Lactic acid normal. -Spoke to nephrology (Dr. Justin Mend), their team will see the patient in the morning -Continue broad-spectrum antibiotics-continued on vancomycin and cefepime -blood cultures thus far negative -Fentanyl PRN pain, Tylenol PRN -Seen by Vascular Surgery, s/p removal of graft. Now s/p [facility] 10/25 -Awaiting PT eval  Recent MSSA bacteremia -Patient was admitted from 10/13-10/19 for sepsis with MSSA bacteremia, unclear source.  -C. difficile panel negative for diarrhea.   -Left arm dialysis graft site was stable on exam at that time.   -Pt was treated with Ancef and ID was on board.  He had stable TTE and TEE.  Left AV graft ultrasound was also stable.   -Patient was discharged with IV cefazolin for 10 more days of his dialysis treatments (stop date October 31). -Continued on broad-spectrum antibiotics per above -continued on vancomycin and cefepime -Blood culture thus far neg -ID following Recommendation for ancef with goal of 6 weeks of therapy with hemodialysis, stop date 09/18/18  Symptomatic anemia -Hemodynamically stable  Hypokalemia -normalized -Continue to follow  ESRD on hemodialysis Monday Wednesday Friday -Currently not volume overloaded -Nephrology following  Hypertension -Hold home hydralazine and amlodipine at this time. -BP remains stable currently  Type 2 diabetes - A1c noted to be 8.4 -Continue home Lantus 15 units daily -Sliding scale insulin sensitive -Continue to follow  blood glucose  GERD -Continue home Protonix -remains stable currently  DVT prophylaxis: heparin subQ Code Status: DNR Family Communication: Pt in room, family at bedside Disposition Plan: Uncertain at this time  Consultants:   Nephrology, ID  Procedures:     Antimicrobials: Anti-infectives (From admission, onward)   Start     Dose/Rate  Route Frequency Ordered Stop   08/07/18 2200  ceFAZolin (ANCEF) IVPB 1 g/50 mL premix     1 g 100 mL/hr over 30 Minutes Intravenous Every 24 hours 08/07/18 1049     08/06/18 2200  ceFAZolin (ANCEF) IVPB 1 g/50 mL premix  Status:  Discontinued     1 g 100 mL/hr over 30 Minutes Intravenous Every 24 hr x 2 08/06/18 2144 08/07/18 1049   08/06/18 1200  ceFEPIme (MAXIPIME) 2 g in sodium chloride 0.9 % 100 mL IVPB  Status:  Discontinued     2 g 200 mL/hr over 30 Minutes Intravenous Every M-W-F (Hemodialysis) 08/05/18 2124 08/06/18 2131   08/06/18 1200  vancomycin (VANCOCIN) IVPB 500 mg/100 ml premix  Status:  Discontinued     500 mg 100 mL/hr over 60 Minutes Intravenous Every M-W-F (Hemodialysis) 08/05/18 2124 08/07/18 1047   08/05/18 2200  vancomycin (VANCOCIN) 1,250 mg in sodium chloride 0.9 % 250 mL IVPB     1,250 mg 166.7 mL/hr over 90 Minutes Intravenous  Once 08/05/18 2108 08/06/18 0123   08/05/18 2130  ceFEPIme (MAXIPIME) 1 g in sodium chloride 0.9 % 100 mL IVPB     1 g 200 mL/hr over 30 Minutes Intravenous  Once 08/05/18 2043 08/06/18 0002   08/05/18 2045  vancomycin (VANCOCIN) 1,178 mg in sodium chloride 0.9 % 500 mL IVPB  Status:  Discontinued     20 mg/kg  58.9 kg 250 mL/hr over 120 Minutes Intravenous  Once 08/05/18 2043 08/05/18 2108      Subjective: No complaints  Objective: Vitals:   08/08/18 1430 08/08/18 1500 08/08/18 1530 08/08/18 1655  BP: (!) 161/57 (!) 124/52 (!) 143/29 (!) 101/41  Pulse: 82 89 96 68  Resp:    18  Temp:    99.6 F (37.6 C)  TempSrc:    Oral  SpO2:    99%  Weight:      Height:        Intake/Output Summary (Last 24 hours) at 08/08/2018 1728 Last data filed at 08/08/2018 1300 Gross per 24 hour  Intake 850.03 ml  Output 5 ml  Net 845.03 ml   Filed Weights   08/06/18 1434 08/06/18 2055 08/08/18 1140  Weight: 61.1 kg 61.1 kg 62.1 kg    Examination: General exam: Conversant, in no acute distress Respiratory system: normal chest rise,  clear, no audible wheezing  Data Reviewed: I have personally reviewed following labs and imaging studies  CBC: Recent Labs  Lab 08/05/18 1949 08/06/18 0503 08/06/18 1435 08/08/18 1215  WBC 7.5 6.3  --  5.9  NEUTROABS 5.4  --   --   --   HGB 6.8* 6.3* 8.8* 7.5*  HCT 20.8* 19.5* 26.0* 23.3*  MCV 105.1* 103.7*  --  104.5*  PLT 165 155  --  242   Basic Metabolic Panel: Recent Labs  Lab 08/05/18 1949 08/05/18 2348 08/06/18 0503 08/06/18 1435 08/07/18 0735 08/08/18 1218  NA 132*  --  133* 138 134* 136  K 3.2*  --  3.6 3.8 4.3 3.8  CL 94*  --  97*  --  100 103  CO2 26  --  25  --  19* 21*  GLUCOSE 320*  --  225* 95 160* 161*  BUN 31*  --  33*  --  39* 44*  CREATININE 9.33*  --  9.81*  --  11.41* 12.48*  CALCIUM 7.2*  --  7.1*  --  7.2* 6.8*  MG  --  2.0  --   --   --   --   PHOS  --   --  3.8  --  4.9* 5.1*   GFR: Estimated Creatinine Clearance: 5 mL/min (A) (by C-G formula based on SCr of 12.48 mg/dL (H)). Liver Function Tests: Recent Labs  Lab 08/05/18 1949 08/06/18 0503 08/07/18 0735 08/08/18 1218  AST 33  --   --   --   ALT <5  --   --   --   ALKPHOS 69  --   --   --   BILITOT 0.4  --   --   --   PROT 6.6  --   --   --   ALBUMIN 2.4* 2.3* 2.2* 2.2*   No results for input(s): LIPASE, AMYLASE in the last 168 hours. No results for input(s): AMMONIA in the last 168 hours. Coagulation Profile: No results for input(s): INR, PROTIME in the last 168 hours. Cardiac Enzymes: No results for input(s): CKTOTAL, CKMB, CKMBINDEX, TROPONINI in the last 168 hours. BNP (last 3 results) No results for input(s): PROBNP in the last 8760 hours. HbA1C: Recent Labs    08/06/18 0503  HGBA1C 8.4*   CBG: Recent Labs  Lab 08/07/18 2219 08/08/18 0739 08/08/18 0841 08/08/18 0914 08/08/18 1652  GLUCAP 96 73 63* 105* 136*   Lipid Profile: No results for input(s): CHOL, HDL, LDLCALC, TRIG, CHOLHDL, LDLDIRECT in the last 72 hours. Thyroid Function Tests: No results for  input(s): TSH, T4TOTAL, FREET4, T3FREE, THYROIDAB in the last 72 hours. Anemia Panel: Recent Labs    08/05/18 2108 08/05/18 2348  VITAMINB12  --  932*  FOLATE 10.6  --   FERRITIN  --  1,083*  TIBC  --  151*  IRON  --  23*  RETICCTPCT  --  2.2   Sepsis Labs: Recent Labs  Lab 08/05/18 1957  LATICACIDVEN 1.56    Recent Results (from the past 240 hour(s))  Blood culture (routine x 2)     Status: None (Preliminary result)   Collection Time: 08/05/18  9:22 PM  Result Value Ref Range Status   Specimen Description BLOOD RIGHT HAND  Final   Special Requests   Final    BOTTLES DRAWN AEROBIC ONLY Blood Culture adequate volume   Culture   Final    NO GROWTH 3 DAYS Performed at [facility], [place] [address] [place], [place] [address]    Report Status PENDING  Incomplete  Blood culture (routine x 2)     Status: None (Preliminary result)   Collection Time: 08/05/18 11:30 PM  Result Value Ref Range Status   Specimen Description SITE NOT SPECIFIED  Final   Special Requests   Final    BOTTLES DRAWN AEROBIC AND ANAEROBIC Blood Culture adequate volume   Culture   Final    NO GROWTH 2 DAYS Performed at [facility], [address], [place], [place] [address]    Report Status PENDING  Incomplete  Aerobic/Anaerobic Culture (surgical/deep wound)     Status: None (Preliminary result)   Collection Time: 08/06/18  5:04 PM  Result Value Ref Range Status   Specimen Description  WOUND LEFT ARM GRAFT  Final   Special Requests [place]  Final   Gram Stain   Final    FEW WBC PRESENT, PREDOMINANTLY PMN RARE GRAM POSITIVE COCCI    Culture   Final    NO GROWTH 2 DAYS NO ANAEROBES ISOLATED; CULTURE IN PROGRESS FOR 5 DAYS Performed at [facility], [address] [place], [place] [address]    Report Status PENDING  Incomplete  Surgical pcr screen     Status: None   Collection Time: 08/07/18 10:50 PM  Result Value Ref Range Status   MRSA, PCR NEGATIVE NEGATIVE  Final   Staphylococcus aureus NEGATIVE NEGATIVE Final    Comment: (NOTE) The Xpert SA Assay (FDA approved for NASAL specimens in patients 26 years of age and older), is one component of a comprehensive surveillance program. It is not intended to diagnose infection nor to guide or monitor treatment. Performed at [facility], [place] [address], [place], [place] [address]      Radiology Studies: Dg Chest 1 View  Result Date: 08/08/2018 CLINICAL DATA:  Status post dialysis catheter placement today. EXAM: CHEST  1 VIEW COMPARISON:  None. FINDINGS: New right IJ approach dialysis catheter is in place with its tip near the superior cavoatrial junction. No pneumothorax. There is left basilar atelectasis. The lungs are otherwise clear. No pneumothorax. Small amount of pleural fluid on the left is seen. Heart size is normal. Aortic atherosclerosis is noted. Vascular stent in the left axilla is seen. Left rib fractures are identified as seen on the comparison study. IMPRESSION: Right IJ approach dialysis catheter tip projects in the lower superior vena cava. No pneumothorax. Subsegmental atelectasis left lung base with a very small effusion. Left rib fractures.  No pneumothorax. Electronically Signed   By: Inge Rise M.D.   On: 08/08/2018 09:30    Scheduled Meds: . aspirin  81 mg Oral Daily  . Chlorhexidine Gluconate Cloth  6 each Topical Q0600  . insulin aspart  0-9 Units Subcutaneous TID WC  . insulin glargine  15 Units Subcutaneous Daily  . multivitamin with minerals  1 tablet Oral Daily  . pantoprazole  40 mg Oral BID   Continuous Infusions: .  ceFAZolin (ANCEF) IV 1 g (08/07/18 2140)     LOS: 3 days   Marylu Lund, MD [facility] Pager On Amion  If 7PM-7AM, please contact night-coverage 08/08/2018, 5:28 PM

## 2018-08-08 NOTE — Op Note (Signed)
    OPERATIVE NOTE  PROCEDURE: 1. Right internal jugular vein tunneled dialysis catheter placement 2. Right internal jugular vein cannulation under ultrasound guidance  PRE-OPERATIVE DIAGNOSIS: end-stage renal failure  POST-OPERATIVE DIAGNOSIS: same as above  SURGEON: Marty Heck, MD  ANESTHESIA: MAC  ESTIMATED BLOOD LOSS: 30 cc  FINDING(S): 1.  Tips of the catheter in the right atrium on fluoroscopy 2.  No obvious pneumothorax on fluoroscopy  SPECIMEN(S):  none  INDICATIONS:   Roger Vazquez is a 67 y.o. male who presents with end stage renal disease.  The patient presents for tunneled dialysis catheter placement.  The patient is aware the risks of tunneled dialysis catheter placement include but are not limited to: bleeding, infection, central venous injury, pneumothorax, possible venous stenosis, possible malpositioning in the venous system, and possible infections related to long-term catheter presence.  The patient was aware of these risks and agreed to proceed.  DESCRIPTION: After written full informed consent was obtained from the patient, the patient was taken back to the operating room.  Prior to induction, the patient was given IV antibiotics.  After obtaining adequate sedation, the patient was prepped and draped in the standard fashion for a chest or neck tunneled dialysis catheter placement.   The cannulation site, the catheter exit site, and tract for the subcutaneous tunnel were then anesthestized with a total of 10 cc of 1% lidocaine without epinephrine.  Under ultrasound guidance, the right internal jugular vein was cannulated with the 18 gauge needle.  A J-wire was then placed down into the right ventricle under fluoroscopic guidance.  The wire was then secured in place with a clamp to the drapes.  I then made stab incisions at the neck and exit sites.   I dissected from the exit site to the cannulation site with a tunneler.   The wire was then unclamped and I  removed the needle.  The skin tract and venotomy was dilated serially with dilators.  Finally, the dilator-sheath was placed under fluoroscopic guidance into the superior vena cava.  The dilator and wire were removed.  A 19 cm Diatek catheter was placed under fluoroscopic guidance down into the right atrium.  The sheath was broken and peeled away while holding the catheter cuff at the level of the skin.  The back end of this catheter was transected, and docked onto the tunneler.  The distal catheter was delivered through the subcutaneous tunnel.  The catheter was transected a second time, revealing the two lumens of this catheter.  The ports were docked onto these two lumens.  The catheter collar was then snapped into place.  Each port was tested by aspirating and flushing.  No resistance was noted.  Each port was then thoroughly flushed with heparinized saline.  The catheter was secured in placed with two interrupted stitches of 3-0 Nylon tied to the catheter.  The neck incision was closed with a U-stitch of 4-0 Monocryl.  The neck and chest incision were cleaned and sterile bandages applied.  Each port was then loaded with concentrated heparin (1000 Units/mL) at the manufacturer recommended volumes to each port.  Sterile caps were applied to each port.    On completion fluoroscopy, the tips of the catheter were in the right atrium, and there was no evidence of pneumothorax.  COMPLICATIONS: None  CONDITION: Stable  Marty Heck, MD [facility] Office: [phone] Pager: [phone]   08/08/2018, 8:31 AM   Marty Heck

## 2018-08-08 NOTE — Progress Notes (Signed)
[facility] [place]  Subjective  - POD#2 s/p excision of infected LUE AV graft.  Afebrile overnight.  No acute events.   Objective (!) 155/73 76 98.4 F (36.9 C) (Oral) 18 100%  Intake/Output Summary (Last 24 hours) at 08/08/2018 0742 Last data filed at 08/08/2018 0601 Gross per 24 hour  Intake 650.03 ml  Output -  Net 650.03 ml    Left arm wrapped, c/d/i  Laboratory Lab Results: Recent Labs    08/05/18 1949 08/06/18 0503 08/06/18 1435  WBC 7.5 6.3  --   HGB 6.8* 6.3* 8.8*  HCT 20.8* 19.5* 26.0*  PLT 165 155  --    BMET Recent Labs    08/06/18 0503 08/06/18 1435 08/07/18 0735  NA 133* 138 134*  K 3.6 3.8 4.3  CL 97*  --  100  CO2 25  --  19*  GLUCOSE 225* 95 160*  BUN 33*  --  39*  CREATININE 9.81*  --  11.41*  CALCIUM 7.1*  --  7.2*    COAG Lab Results  Component Value Date   INR 1.08 07/31/2018   INR 0.99 07/27/2018   INR 0.97 10/07/2011   No results found for: PTT  Assessment/Planning: Doing ok POD#2 s/p left upper extremity AV graft excision.  Plan for [facility] today.  Will dressing changes to left upper extremity - penrose drain.   Marty Heck 08/08/2018 7:42 AM -- Marty Heck

## 2018-08-08 NOTE — Care Management Note (Signed)
Case Management Note  Patient Details  Name: Roger Vazquez MRN: [id number] Date of Birth: 02-Nov-1950  Subjective/Objective:  Pt admitted with infected HD graft - recently discharged with MSSA bactermia               Action/Plan:  PTA from home - active with [facility] for  [facility], HHPT, - [facility] informed of pts admit.  CM requested resumption orders   Expected Discharge Date:                  Expected Discharge Plan:  [place]  In-House Referral:     Discharge planning Services  CM Consult  Post Acute Care Choice:    Choice offered to:     DME Arranged:    DME Agency:     HH Arranged:    [place] Agency:     Status of Service:  In process, will continue to follow  If discussed at Long Length of Stay Meetings, dates discussed:    Additional Comments:  Maryclare Labrador, RN 08/08/2018, 5:06 PM

## 2018-08-08 NOTE — Progress Notes (Addendum)
[facility] Progress Note   Subjective:   Patient seen at bedside post procedure.  Feeling ok.  Denies new symptoms.   Objective Vitals:   08/08/18 0900 08/08/18 0905 08/08/18 0915 08/08/18 0930  BP: (!) 195/69 (!) 160/56  (!) 184/63  Pulse: 77 79 78 74  Resp: 13 (!) 21 17 20   Temp:    98.1 F (36.7 C)  TempSrc:    Oral  SpO2: 100% 99% 100% 100%  Weight:      Height:       Physical Exam General:NAD, pleasant elderly male Heart:RRR Lungs:CTAB  Abdomen:soft, NTND Extremities:no LE edema Dialysis Access: new R IJ TDC.  LUE wrapped - c/d/i  Filed Weights   08/06/18 0024 08/06/18 1434 08/06/18 2055  Weight: 61.1 kg 61.1 kg 61.1 kg    Intake/Output Summary (Last 24 hours) at 08/08/2018 1028 Last data filed at 08/08/2018 0830 Gross per 24 hour  Intake 610.03 ml  Output 5 ml  Net 605.03 ml    Additional Objective Labs: Basic Metabolic Panel: Recent Labs  Lab 08/05/18 1949 08/06/18 0503 08/06/18 1435 08/07/18 0735  NA 132* 133* 138 134*  K 3.2* 3.6 3.8 4.3  CL 94* 97*  --  100  CO2 26 25  --  19*  GLUCOSE 320* 225* 95 160*  BUN 31* 33*  --  39*  CREATININE 9.33* 9.81*  --  11.41*  CALCIUM 7.2* 7.1*  --  7.2*  PHOS  --  3.8  --  4.9*   Liver Function Tests: Recent Labs  Lab 08/05/18 1949 08/06/18 0503 08/07/18 0735  AST 33  --   --   ALT <5  --   --   ALKPHOS 69  --   --   BILITOT 0.4  --   --   PROT 6.6  --   --   ALBUMIN 2.4* 2.3* 2.2*   CBC: Recent Labs  Lab 08/05/18 1949 08/06/18 0503 08/06/18 1435  WBC 7.5 6.3  --   NEUTROABS 5.4  --   --   HGB 6.8* 6.3* 8.8*  HCT 20.8* 19.5* 26.0*  MCV 105.1* 103.7*  --   PLT 165 155  --    Blood Culture    Component Value Date/Time   SDES WOUND LEFT ARM GRAFT 08/06/2018 1704   SPECREQUEST VANCOMYCON AND MAXIPINE 08/06/2018 1704   CULT  08/06/2018 1704    NO GROWTH < 24 HOURS Performed at [facility] [address], [place], [place] [address]    REPTSTATUS PENDING  08/06/2018 1704   CBG: Recent Labs  Lab 08/07/18 1617 08/07/18 2219 08/08/18 0739 08/08/18 0841 08/08/18 0914  GLUCAP 146* 96 73 63* 105*   Iron Studies:  Recent Labs    08/05/18 2348  IRON 23*  TIBC 151*  FERRITIN 1,083*   Lab Results  Component Value Date   INR 1.08 07/31/2018   INR 0.99 07/27/2018   INR 0.97 10/07/2011   Studies/Results: Dg Chest 1 View  Result Date: 08/08/2018 CLINICAL DATA:  Status post dialysis catheter placement today. EXAM: CHEST  1 VIEW COMPARISON:  None. FINDINGS: New right IJ approach dialysis catheter is in place with its tip near the superior cavoatrial junction. No pneumothorax. There is left basilar atelectasis. The lungs are otherwise clear. No pneumothorax. Small amount of pleural fluid on the left is seen. Heart size is normal. Aortic atherosclerosis is noted. Vascular stent in the left axilla is seen. Left rib fractures are identified as  seen on the comparison study. IMPRESSION: Right IJ approach dialysis catheter tip projects in the lower superior vena cava. No pneumothorax. Subsegmental atelectasis left lung base with a very small effusion. Left rib fractures.  No pneumothorax. Electronically Signed   By: Inge Rise M.D.   On: 08/08/2018 09:30    Medications: .  ceFAZolin (ANCEF) IV 1 g (08/07/18 2140)   . aspirin  81 mg Oral Daily  . Chlorhexidine Gluconate Cloth  6 each Topical Q0600  . insulin aspart  0-9 Units Subcutaneous TID WC  . insulin glargine  15 Units Subcutaneous Daily  . multivitamin with minerals  1 tablet Oral Daily  . 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 Donnetta Hutching.  TDC placed this AM following 36 hour line holiday. Repeat [facility] 10/22  NGTD. ID following w/ ABX recommendations of 6 weeks cefazolin - end date 09/18/18. F/u w/ID in 4wks. 2. ESRD - on HD MWF. K 4.3. Orders written for HD today w/ new TDC.   3. Anemia of CKD- Last Hgb 8.8, s/p 2 unit pRBC.  Aranesp 211mcg ordered with HD today. 4. Secondary hyperparathyroidism - CCa 8.4, Phos 4.9. Not on binders or VDRA.  5. HTN/volume - BP elevated, not on antihypertensives. Expect improvement post HD.  Does not appear volume overloaded on exam.  Net UF goal today 2-2.5L.  Need standing weights to better assess.  6. Nutrition - Alb 2.2. Renal diet w/fluid restrictions.  Prostat. Renal Vit 7. DM - per primary  Jen Mow, PA-C [facility] Pager: [phone] 08/08/2018,10:28 AM  LOS: 3 days   I have seen and examined this patient and agree with plan and assessment in the above note with renal recommendations/intervention highlighted.  Pt seen on hd via new RIJ TDC.  Agree with 6 weeks of abx and cont to follow.  Broadus John A Wojciech Willetts,MD 08/08/2018 2:55 PM

## 2018-08-08 NOTE — Anesthesia Postprocedure Evaluation (Signed)
Anesthesia Post Note  Patient: Roger Vazquez  Procedure(s) Performed: INSERTION OF TUNNELED  DIALYSIS CATHETER (Right Chest)     Patient location during evaluation: PACU Anesthesia Type: MAC Level of consciousness: awake and alert Pain management: pain level controlled Vital Signs Assessment: post-procedure vital signs reviewed and stable Respiratory status: spontaneous breathing, nonlabored ventilation, respiratory function stable and patient connected to nasal cannula oxygen Cardiovascular status: stable and blood pressure returned to baseline Postop Assessment: no apparent nausea or vomiting Anesthetic complications: no    Last Vitals:  Vitals:   08/08/18 0930 08/08/18 1140  BP: (!) 184/63 (!) 206/107  Pulse: 74 80  Resp: 20 16  Temp: 36.7 C 36.7 C  SpO2: 100% 99%    Last Pain:  Vitals:   08/08/18 1140  TempSrc: Oral  PainSc: 0-No pain                 Effie Berkshire

## 2018-08-08 NOTE — Transfer of Care (Signed)
Immediate Anesthesia Transfer of Care Note  Patient: Roger Vazquez  Procedure(s) Performed: INSERTION OF TUNNELED  DIALYSIS CATHETER (Right Chest)  Patient Location: PACU  Anesthesia Type:MAC  Level of Consciousness: awake, oriented, patient cooperative and responds to stimulation  Airway & Oxygen Therapy: Patient Spontanous Breathing  Post-op Assessment: Report given to RN, Post -op Vital signs reviewed and stable and Patient moving all extremities X 4  Post vital signs: Reviewed and stable  Last Vitals:  Vitals Value Taken Time  BP 166/63 08/08/2018  8:40 AM  Temp    Pulse 80 08/08/2018  8:41 AM  Resp 16 08/08/2018  8:41 AM  SpO2 100 % 08/08/2018  8:41 AM  Vitals shown include unvalidated device data.  Last Pain:  Vitals:   08/08/18 0545  TempSrc: Oral  PainSc:       Patients Stated Pain Goal: 0 (57/84/69 6295)  Complications: No apparent anesthesia complications

## 2018-08-08 NOTE — Progress Notes (Signed)
Pharmacy Antibiotic Note  Roger Vazquez is a 67 y.o. male admitted on 08/05/2018 with sepsis due to infected AVG - removed 10/23 and new TDC placed 10/25. Noted recent MSSA bacteremia with intended stop date of 10/31, now extended for 6 additional weeks from AVG removal thru 12/5. Pharmacy consulted for Cefazolin dosing.   ESRD-MWF, new TDC placed today. Will ensure patient tolerates before transitioning back to 2g/HD-MWF dosing.   Plan: - Continue Cefazolin 1g IV Q 24 hrs - Will follow-up HD tolerance with new TDC and intended HD schedule  Height: 5\' 6"  (167.6 cm) Weight: 136 lb 14.5 oz (62.1 kg) IBW/kg (Calculated) : 63.8  Temp (24hrs), Avg:98.3 F (36.8 C), Min:97.3 F (36.3 C), Max:99.2 F (37.3 C)  Recent Labs  Lab 08/05/18 1949 08/05/18 1957 08/06/18 0503 08/07/18 0735  WBC 7.5  --  6.3  --   CREATININE 9.33*  --  9.81* 11.41*  LATICACIDVEN  --  1.56  --   --     Estimated Creatinine Clearance: 5.4 mL/min (A) (by C-G formula based on SCr of 11.41 mg/dL (H)).    No Known Allergies  Antimicrobials this admission: Cefepime 10/23 x 1 Vancomycin 10/22 >>  Ancef 10/23 >>  Dose adjustments this admission: N/A  Microbiology results: 10/22 BCx >> ngtd 10/23 AVG cx >> rare GPC on GS >> ngtd  Thank you for allowing pharmacy to be a part of this patient's care.  Alycia Rossetti, PharmD, BCPS Clinical Pharmacist Pager: [phone] Clinical phone for 08/08/2018 from 7a-3:30p: [phone] If after 3:30p, please call main pharmacy at: [phone] Please check AMION for all [place] numbers 08/08/2018 1:18 PM

## 2018-08-09 ENCOUNTER — Encounter (HOSPITAL_COMMUNITY): Payer: Self-pay | Admitting: Vascular Surgery

## 2018-08-09 DIAGNOSIS — R509 Fever, unspecified: Secondary | ICD-10-CM

## 2018-08-09 LAB — BPAM RBC
BLOOD PRODUCT EXPIRATION DATE: 201911162359
BLOOD PRODUCT EXPIRATION DATE: 201911262359
Blood Product Expiration Date: 201911212359
ISSUE DATE / TIME: 201910231101
UNIT TYPE AND RH: 5100
Unit Type and Rh: 5100
Unit Type and Rh: 9500

## 2018-08-09 LAB — GLUCOSE, CAPILLARY
GLUCOSE-CAPILLARY: 89 mg/dL (ref 70–99)
Glucose-Capillary: 79 mg/dL (ref 70–99)
Glucose-Capillary: 142 mg/dL — ABNORMAL HIGH (ref 70–99)
Glucose-Capillary: 101 mg/dL — ABNORMAL HIGH (ref 70–99)

## 2018-08-09 LAB — TYPE AND SCREEN
ABO/RH(D): O POS
Antibody Screen: POSITIVE
DAT, IGG: POSITIVE
Donor AG Type: NEGATIVE
UNIT DIVISION: 0
Unit division: 0
Unit division: 0

## 2018-08-09 LAB — CBC
HEMATOCRIT: 24.3 % — AB (ref 39.0–52.0)
Hemoglobin: 7.6 g/dL — ABNORMAL LOW (ref 13.0–17.0)
MCH: 32.8 pg (ref 26.0–34.0)
MCHC: 31.3 g/dL (ref 30.0–36.0)
MCV: 104.7 fL — ABNORMAL HIGH (ref 80.0–100.0)
Platelets: 196 10*3/uL (ref 150–400)
RBC: 2.32 MIL/uL — ABNORMAL LOW (ref 4.22–5.81)
RDW: 14.5 % (ref 11.5–15.5)
WBC: 6.1 10*3/uL (ref 4.0–10.5)
nRBC: 0 % (ref 0.0–0.2)

## 2018-08-09 LAB — HEPATITIS B SURFACE ANTIBODY,QUALITATIVE: Hep B S Ab: NONREACTIVE

## 2018-08-09 LAB — HEPATITIS B CORE ANTIBODY, TOTAL: Hep B Core Total Ab: POSITIVE — AB

## 2018-08-09 LAB — HEPATITIS B SURFACE ANTIGEN: HEP B S AG: NEGATIVE

## 2018-08-09 MED ORDER — CEFAZOLIN SODIUM-DEXTROSE 2-4 GM/100ML-% IV SOLN
2.0000 g | INTRAVENOUS | Status: DC
  Administered 2018-08-11: 2 g via INTRAVENOUS
  Filled 2018-08-09 (×3): qty 100

## 2018-08-09 NOTE — Progress Notes (Addendum)
  Progress Note    08/09/2018 11:09 AM 1 Day Post-Op  Subjective:  No complaints this morning.  Pain controlled L arm.  R IJ [place] worked well on dialysis per patient.   Vitals:   08/09/18 0611 08/09/18 0838  BP: (!) 148/88 (!) 151/71  Pulse: 70 67  Resp: 16 18  Temp: 97.7 F (36.5 C) 98 F (36.7 C)  SpO2: 100% 100%   Physical Exam: Lungs:  Non labored Incisions:  L axilla incision unremarkable, skin edges approximated well; upper arm incision without drainage, skin edges approximated well; mid arm incision with serosanguinous drainage, no purulence, drain in place; grip strength and sensation intact L hand; TDC site unremarkable Abdomen:  Soft Neurologic: A&O  CBC    Component Value Date/Time   WBC 5.9 08/08/2018 1215   RBC 2.23 (L) 08/08/2018 1215   HGB 7.5 (L) 08/08/2018 1215   HCT 23.3 (L) 08/08/2018 1215   PLT 176 08/08/2018 1215   MCV 104.5 (H) 08/08/2018 1215   MCH 33.6 08/08/2018 1215   MCHC 32.2 08/08/2018 1215   RDW 14.5 08/08/2018 1215   LYMPHSABS 1.2 08/05/2018 1949   MONOABS 0.8 08/05/2018 1949   EOSABS 0.0 08/05/2018 1949   BASOSABS 0.0 08/05/2018 1949    BMET    Component Value Date/Time   NA 136 08/08/2018 1218   K 3.8 08/08/2018 1218   CL 103 08/08/2018 1218   CO2 21 (L) 08/08/2018 1218   GLUCOSE 161 (H) 08/08/2018 1218   BUN 44 (H) 08/08/2018 1218   CREATININE 12.48 (H) 08/08/2018 1218   CALCIUM 6.8 (L) 08/08/2018 1218   CALCIUM 7.1 (L) 08/01/2018 0944   GFRNONAA 4 (L) 08/08/2018 1218   GFRAA 4 (L) 08/08/2018 1218    INR    Component Value Date/Time   INR 1.08 07/31/2018 0656     Intake/Output Summary (Last 24 hours) at 08/09/2018 1109 Last data filed at 08/09/2018 0930 Gross per 24 hour  Intake 530 ml  Output 0 ml  Net 530 ml     Assessment/Plan:  67 y.o. male is s/p removal of infected L arm loop graft with subsequent placement of R IJ TDC 1 Day Post-Op   Dressing changed and drain pulled back this morning LUE R IJ TDC  site unremarkable and working well for HD last night We will continue to follow for wound care L UE   Dagoberto Ligas, PA-C [facility] [phone] 08/09/2018 11:09 AM  I have seen and evaluated the patient. I agree with the PA note as documented above. RIJ tunneled catheter working well.  Drain pulled back in LUE wound after graft excision where infected.  Marty Heck, MD [facility] Office: [phone] Pager: [phone]

## 2018-08-09 NOTE — Progress Notes (Signed)
Pharmacy Antibiotic Note  Roger Vazquez is a 67 y.o. male admitted on 08/05/2018 with sepsis due to infected AVG - removed 10/23 and new TDC placed 10/25. Noted recent MSSA bacteremia with intended stop date of 10/31, now extended for 6 additional weeks from AVG removal thru 12/5. Pharmacy consulted for Cefazolin dosing.   ESRD-MWF, new TDC placed. Cath is working well. Plan to resume MWF schedule. Ancef schedule will adjustedl   Plan: - Change cefazolin to 2g MWF with HD until 09/18/18  Height: 5\' 6"  (167.6 cm) Weight: 136 lb 12.8 oz (62.1 kg) IBW/kg (Calculated) : 63.8  Temp (24hrs), Avg:98.5 F (36.9 C), Min:97.7 F (36.5 C), Max:99.6 F (37.6 C)  Recent Labs  Lab 08/05/18 1949 08/05/18 1957 08/06/18 0503 08/07/18 0735 08/08/18 1215 08/08/18 1218 08/09/18 1101  WBC 7.5  --  6.3  --  5.9  --  6.1  CREATININE 9.33*  --  9.81* 11.41*  --  12.48*  --   LATICACIDVEN  --  1.56  --   --   --   --   --     Estimated Creatinine Clearance: 5 mL/min (A) (by C-G formula based on SCr of 12.48 mg/dL (H)).    No Known Allergies  Antimicrobials this admission: Cefepime 10/23 x 1 Vancomycin 10/22 >>  Ancef 10/23 >>09/18/18  Dose adjustments this admission: N/A  Microbiology results: 10/22 BCx >> ngtd 10/23 AVG cx >> rare GPC on GS >> ngtd  Onnie Boer, PharmD, Uncertain, AAHIVP, CPP Infectious Disease Pharmacist Pager: [phone] 08/09/2018 11:34 AM

## 2018-08-09 NOTE — Progress Notes (Addendum)
[facility] Progress Note   Subjective:    Seen and examined at bedside.  Feeling better. Asking when he can go home.   Objective Vitals:   08/08/18 2039 08/08/18 2040 08/09/18 0611 08/09/18 0838  BP: (!) 117/57  (!) 148/88 (!) 151/71  Pulse: 81  70 67  Resp: 16  16 18   Temp: 99 F (37.2 C)  97.7 F (36.5 C) 98 F (36.7 C)  TempSrc: Oral  Oral Oral  SpO2: 100%  100% 100%  Weight:  62.1 kg    Height:       Physical Exam General:NAD, pleasant elderly male Heart:RRR Lungs:CTAB, nml WOB  Abdomen:soft, NTND Extremities:no LE edema Dialysis Access: R IJ TDC, L AVG removed - Dressed w/ACE wrap  Filed Weights   08/08/18 1140 08/08/18 1548 08/08/18 2040  Weight: 62.1 kg 58.9 kg 62.1 kg    Intake/Output Summary (Last 24 hours) at 08/09/2018 1020 Last data filed at 08/09/2018 0930 Gross per 24 hour  Intake 650 ml  Output 0 ml  Net 650 ml    Additional Objective Labs: Basic Metabolic Panel: Recent Labs  Lab 08/06/18 0503 08/06/18 1435 08/07/18 0735 08/08/18 1218  NA 133* 138 134* 136  K 3.6 3.8 4.3 3.8  CL 97*  --  100 103  CO2 25  --  19* 21*  GLUCOSE 225* 95 160* 161*  BUN 33*  --  39* 44*  CREATININE 9.81*  --  11.41* 12.48*  CALCIUM 7.1*  --  7.2* 6.8*  PHOS 3.8  --  4.9* 5.1*   Liver Function Tests: Recent Labs  Lab 08/05/18 1949 08/06/18 0503 08/07/18 0735 08/08/18 1218  AST 33  --   --   --   ALT <5  --   --   --   ALKPHOS 69  --   --   --   BILITOT 0.4  --   --   --   PROT 6.6  --   --   --   ALBUMIN 2.4* 2.3* 2.2* 2.2*   CBC: Recent Labs  Lab 08/05/18 1949 08/06/18 0503 08/06/18 1435 08/08/18 1215  WBC 7.5 6.3  --  5.9  NEUTROABS 5.4  --   --   --   HGB 6.8* 6.3* 8.8* 7.5*  HCT 20.8* 19.5* 26.0* 23.3*  MCV 105.1* 103.7*  --  104.5*  PLT 165 155  --  176   Blood Culture    Component Value Date/Time   SDES WOUND LEFT ARM GRAFT 08/06/2018 1704   SPECREQUEST VANCOMYCON AND MAXIPINE 08/06/2018 1704   CULT   08/06/2018 1704    NO GROWTH 2 DAYS NO ANAEROBES ISOLATED; CULTURE IN PROGRESS FOR 5 DAYS Performed at [facility], [place] [address], [place], [place] [address]    REPTSTATUS PENDING 08/06/2018 1704    CBG: Recent Labs  Lab 08/08/18 0841 08/08/18 0914 08/08/18 1652 08/08/18 2147 08/09/18 0839  GLUCAP 63* 105* 136* 91 79   Studies/Results: Dg Chest 1 View  Result Date: 08/08/2018 CLINICAL DATA:  Status post dialysis catheter placement today. EXAM: CHEST  1 VIEW COMPARISON:  None. FINDINGS: New right IJ approach dialysis catheter is in place with its tip near the superior cavoatrial junction. No pneumothorax. There is left basilar atelectasis. The lungs are otherwise clear. No pneumothorax. Small amount of pleural fluid on the left is seen. Heart size is normal. Aortic atherosclerosis is noted. Vascular stent in the left axilla is seen. Left rib fractures are  identified as seen on the comparison study. IMPRESSION: Right IJ approach dialysis catheter tip projects in the lower superior vena cava. No pneumothorax. Subsegmental atelectasis left lung base with a very small effusion. Left rib fractures.  No pneumothorax. Electronically Signed   By: Inge Rise M.D.   On: 08/08/2018 09:30    Medications: .  ceFAZolin (ANCEF) IV 1 g (08/08/18 2227)   . aspirin  81 mg Oral Daily  . Chlorhexidine Gluconate Cloth  6 each Topical Q0600  . insulin aspart  0-9 Units Subcutaneous TID WC  . insulin glargine  15 Units Subcutaneous Daily  . multivitamin with minerals  1 tablet Oral Daily  . 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 Donnetta Hutching.  TDC 10/25 following 36 hour line holiday. Repeat [facility] [place] 10/22 NGTD. ID  following w/ ABX recommendations of 6 weeks cefazolin - end date 09/18/18. F/u w/ID in 4wks. 2. ESRD - on HD MWF. K 3.8. HD yesterday tolerated well using TDC.  Plan for HD Monday if remains inpatient.  3. Anemia of CKD- Last Hgb 7.5 yesterday, s/p 1 unit pRBC on 10/23.  ESA not given. Recheck CBC today.  Will transfuse if Hgb <7.  4. Secondary hyperparathyroidism - CCa 8.2, Phos 5.1. Not on binders or VDRA.  5. HTN/volume - BP elevated, not on antihypertensives.  Does not appear volume overloaded on exam.  Per standing weights post HD yesterday, met EDW.  6. Nutrition - Alb 2.2. Renal diet w/fluid restrictions.  Prostat. Renal Vit 7. DM - per primary   Jen Mow, PA-C [facility] Pager: [phone] 08/09/2018,10:20 AM  LOS: 4 days   I have seen and examined this patient and agree with plan and assessment in the above note with renal recommendations/intervention highlighted.  Continue with IV abx through 09/18/18.  Discharge per [facility] as he is having ongoing wound care. Broadus John A Szymon Foiles,MD 08/09/2018 1:12 PM

## 2018-08-09 NOTE — Evaluation (Signed)
Physical Therapy Evaluation Patient Details Name: Roger Vazquez MRN: [id number] DOB: 1951-06-28 Today's Date: 08/09/2018   History of Present Illness  Patient is a 67 y/o male who presents with LUE pain/swelling suspected infection of Av graft. s/p excision of infected AV graft and placement of R IJ tunneled diaylsis catheter 10/25. PMH includes ESRD on HD, HTN, DM.  Clinical Impression  Patient presents with LUE swelling, generalized weakness from hospital stay and mild balance deficits s/p above. Tolerated gait training with supervision for safety. Strength and balance improved with increased distance. Encouraged increasing activity while in the hospital and anticipate fast return to baseline mobility.Educated pt to perform AROM exercises and elevation LUE to help with swelling. Pt lives alone and independent PTA. Does not require skilled therapy services. Discharge from therapy.    Follow Up Recommendations No PT follow up;Supervision - Intermittent    Equipment Recommendations  None recommended by PT    Recommendations for Other Services       Precautions / Restrictions Precautions Precautions: None Restrictions Weight Bearing Restrictions: No      Mobility  Bed Mobility Overal bed mobility: Modified Independent             General bed mobility comments: HOB elevated, no assist needed.  Transfers Overall transfer level: Needs assistance Equipment used: None Transfers: Sit to/from Stand Sit to Stand: Supervision         General transfer comment: Supervision for safety.   Ambulation/Gait Ambulation/Gait assistance: Supervision Gait Distance (Feet): 200 Feet Assistive device: None Gait Pattern/deviations: Step-through pattern;Decreased stride length;Drifts right/left     General Gait Details: Slow, mildly unsteady gait- speed and balance improved with increased distance.  Stairs            Wheelchair Mobility    Modified Rankin (Stroke Patients  Only)       Balance Overall balance assessment: Needs assistance Sitting-balance support: Feet supported;No upper extremity supported Sitting balance-Leahy Scale: Good     Standing balance support: During functional activity Standing balance-Leahy Scale: Fair                               Pertinent Vitals/Pain Pain Assessment: No/denies pain    Home Living Family/patient expects to be discharged to:: Private residence Living Arrangements: Alone Available Help at Discharge: Friend(s);Available PRN/intermittently Type of Home: House Home Access: Level entry     Home Layout: One level Home Equipment: Walker - 2 wheels;Cane - single point      Prior Function Level of Independence: Independent         Comments: Drives to dialysis. Cooks, cleans. Has a neighbor to help with meals.     Hand Dominance   Dominant Hand: Right    Extremity/Trunk Assessment   Upper Extremity Assessment Upper Extremity Assessment: Defer to OT evaluation;LUE deficits/detail LUE Deficits / Details: Swelling present LUE    Lower Extremity Assessment Lower Extremity Assessment: Generalized weakness       Communication   Communication: No difficulties  Cognition Arousal/Alertness: Awake/alert Behavior During Therapy: WFL for tasks assessed/performed Overall Cognitive Status: Within Functional Limits for tasks assessed                                        General Comments      Exercises     Assessment/Plan    PT Assessment  Patent does not need any further PT services  PT Problem List         PT Treatment Interventions      PT Goals (Current goals can be found in the Care Plan section)  Acute Rehab PT Goals Patient Stated Goal: to go home PT Goal Formulation: All assessment and education complete, DC therapy Time For Goal Achievement: 08/23/18 Potential to Achieve Goals: Good    Frequency     Barriers to discharge         Co-evaluation               AM-PAC PT "6 Clicks" Daily Activity  Outcome Measure Difficulty turning over in bed (including adjusting bedclothes, sheets and blankets)?: A Little Difficulty moving from lying on back to sitting on the side of the bed? : None Difficulty sitting down on and standing up from a chair with arms (e.g., wheelchair, bedside commode, etc,.)?: None Help needed moving to and from a bed to chair (including a wheelchair)?: None Help needed walking in hospital room?: A Little Help needed climbing 3-5 steps with a railing? : A Little 6 Click Score: 21    End of Session Equipment Utilized During Treatment: Gait belt Activity Tolerance: Patient tolerated treatment well Patient left: in bed;with call bell/phone within reach;with bed alarm set Nurse Communication: Mobility status PT Visit Diagnosis: Difficulty in walking, not elsewhere classified (R26.2);Muscle weakness (generalized) (M62.81)    Time: 3536-1443 PT Time Calculation (min) (ACUTE ONLY): 20 min   Charges:   PT Evaluation $PT Eval Low Complexity: 1 Low          Wray Kearns, PT, DPT Acute Rehabilitation Services Pager [phone] Office [phone]      [name] 08/09/2018, 12:04 PM

## 2018-08-09 NOTE — Progress Notes (Signed)
PROGRESS NOTE    Roger Vazquez  RXV:[id number] DOB: 1951/05/24 DOA: 08/05/2018 PCP: [facility]    Brief Narrative:  67 y.o. male with medical history significant of end-stage renal disease on MWF hemodialysis, hypertension, diabetes presenting to the hospital for further evaluation of left arm swelling at the site of his AV graft.  Family states patient was discharged from the hospital on Saturday.  He then went to dialysis on Monday (yesterday) and it was noted that his left arm was swollen.  As such, patient was advised to come into the hospital for further evaluation.  He goes for hemodialysis to the [facility].  Patient has had a poor appetite since yesterday and had a fever of 102 F at home today.  He has been feeling weak. Denies having any nausea or vomiting.  He is complaining of pain in his left arm at the site of the AV graft.  Patient was admitted from 10/13-10/19 for sepsis with MSSA bacteremia, unclear source.  C. difficile panel negative for diarrhea.  Left arm dialysis graft site was stable on exam.  He was treated with Ancef and ID was on board.  He had stable TTE and TEE.  Left AV graft ultrasound was also stable.  He was discharged with IV cefazolin for 10 more days of his dialysis treatments (stop date October 31).   ED Course: Temperature 100.5 F.  Remainder of vitals stable.  No leukocytosis.  Lactic acid normal.  Hemoglobin 6.8; was 7.6 on discharge 4 days ago.  Chest x-ray showing no acute finding.  2 units of packed red blood cells ordered in the ED.  TRH patient admitted.  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  No tachycardia or hypotension. No leukocytosis.  Lactic acid normal. -Spoke to nephrology (Dr. Justin Mend), their team will see the patient in the morning -Continue broad-spectrum antibiotics-continued on vancomycin and cefepime -blood cultures thus far negative -Fentanyl PRN pain, Tylenol PRN -Seen by Vascular Surgery, s/p removal of graft. Now s/p [facility] 10/25 -No PT needs noted per therapy. -discussed with Vascular Surgery, pending removal of drain. Possible in 1-2 days  Recent MSSA bacteremia -Patient was admitted from 10/13-10/19 for sepsis with MSSA bacteremia, unclear source.  -C. difficile panel negative for diarrhea.   -Left arm dialysis graft site was stable on exam at that time.   -Pt was treated with Ancef and ID was on board.  He had stable TTE and TEE.  Left AV graft ultrasound was also stable.   -Patient was discharged with IV cefazolin for 10 more days of his dialysis treatments (stop date October 31). -Continued on broad-spectrum antibiotics per above -continued on vancomycin and cefepime -Blood culture thus far neg -ID following Recommendation for ancef with goal of 6 weeks of therapy with hemodialysis, stop date 09/18/18  Symptomatic anemia -Hemodynamically stable  Hypokalemia -normalized -Continue to follow  ESRD on hemodialysis Monday Wednesday Friday -Currently not volume overloaded -Nephrology following  Hypertension -Hold home hydralazine and amlodipine at this time. -BP remains stable currently  Type 2 diabetes - A1c noted to be 8.4 -Continue home Lantus 15 units daily -Sliding scale insulin sensitive -Continue to follow blood glucose  GERD -Continue home Protonix -remains stable currently  DVT prophylaxis: heparin subQ Code Status: DNR Family Communication: Pt in room, family at bedside Disposition Plan: Uncertain at this  time  Consultants:   Nephrology, ID, vascular surgery  Procedures:     Antimicrobials: Anti-infectives (From admission, onward)   Start     Dose/Rate Route Frequency Ordered Stop   08/11/18 1200  ceFAZolin (ANCEF) IVPB 2g/100 mL premix     2 g 200 mL/hr over 30 Minutes Intravenous Every M-W-F (Hemodialysis) 08/09/18 1132 09/19/18 1159   08/07/18 2200  ceFAZolin (ANCEF) IVPB 1 g/50 mL premix     1 g 100 mL/hr over 30 Minutes Intravenous Every 24 hours 08/07/18 1049 08/10/18 2359   08/06/18 2200  ceFAZolin (ANCEF) IVPB 1 g/50 mL premix  Status:  Discontinued     1 g 100 mL/hr over 30 Minutes Intravenous Every 24 hr x 2 08/06/18 2144 08/07/18 1049   08/06/18 1200  ceFEPIme (MAXIPIME) 2 g in sodium chloride 0.9 % 100 mL IVPB  Status:  Discontinued     2 g 200 mL/hr over 30 Minutes Intravenous Every M-W-F (Hemodialysis) 08/05/18 2124 08/06/18 2131   08/06/18 1200  vancomycin (VANCOCIN) IVPB 500 mg/100 ml premix  Status:  Discontinued     500 mg 100 mL/hr over 60 Minutes Intravenous Every M-W-F (Hemodialysis) 08/05/18 2124 08/07/18 1047   08/05/18 2200  vancomycin (VANCOCIN) 1,250 mg in sodium chloride 0.9 % 250 mL IVPB     1,250 mg 166.7 mL/hr over 90 Minutes Intravenous  Once 08/05/18 2108 08/06/18 0123   08/05/18 2130  ceFEPIme (MAXIPIME) 1 g in sodium chloride 0.9 % 100 mL IVPB     1 g 200 mL/hr over 30 Minutes Intravenous  Once 08/05/18 2043 08/06/18 0002   08/05/18 2045  vancomycin (VANCOCIN) 1,178 mg in sodium chloride 0.9 % 500 mL IVPB  Status:  Discontinued     20 mg/kg  58.9 kg 250 mL/hr over 120 Minutes Intravenous  Once 08/05/18 2043 08/05/18 2108      Subjective: Eager to go home  Objective: Vitals:   08/08/18 2039 08/08/18 2040 08/09/18 0611 08/09/18 0838  BP: (!) 117/57  (!) 148/88 (!) 151/71  Pulse: 81  70 67  Resp: 16  16 18   Temp: 99 F (37.2 C)  97.7 F (36.5 C) 98 F (36.7 C)  TempSrc: Oral  Oral Oral  SpO2: 100%  100% 100%  Weight:  62.1 kg     Height:        Intake/Output Summary (Last 24 hours) at 08/09/2018 1434 Last data filed at 08/09/2018 0930 Gross per 24 hour  Intake 530 ml  Output 0 ml  Net 530 ml   Filed Weights   08/08/18 1140 08/08/18 1548 08/08/18 2040  Weight: 62.1 kg 58.9 kg 62.1 kg    Examination: General exam: Awake, laying in bed, in nad Respiratory system: Normal respiratory effort, no wheezing  Data Reviewed: I have personally reviewed following labs and imaging studies  CBC: Recent Labs  Lab 08/05/18 1949 08/06/18 0503 08/06/18 1435 08/08/18 1215 08/09/18 1101  WBC 7.5 6.3  --  5.9 6.1  NEUTROABS 5.4  --   --   --   --   HGB 6.8* 6.3* 8.8* 7.5* 7.6*  HCT 20.8* 19.5* 26.0* 23.3* 24.3*  MCV 105.1* 103.7*  --  104.5* 104.7*  PLT 165 155  --  176 326   Basic Metabolic Panel: Recent Labs  Lab 08/05/18 1949 08/05/18 2348 08/06/18 0503 08/06/18 1435 08/07/18 0735 08/08/18 1218  NA 132*  --  133* 138 134* 136  K 3.2*  --  3.6 3.8 4.3 3.8  CL 94*  --  97*  --  100 103  CO2 26  --  25  --  19* 21*  GLUCOSE 320*  --  225* 95 160* 161*  BUN 31*  --  33*  --  39* 44*  CREATININE 9.33*  --  9.81*  --  11.41* 12.48*  CALCIUM 7.2*  --  7.1*  --  7.2* 6.8*  MG  --  2.0  --   --   --   --   PHOS  --   --  3.8  --  4.9* 5.1*   GFR: Estimated Creatinine Clearance: 5 mL/min (A) (by C-G formula based on SCr of 12.48 mg/dL (H)). Liver Function Tests: Recent Labs  Lab 08/05/18 1949 08/06/18 0503 08/07/18 0735 08/08/18 1218  AST 33  --   --   --   ALT <5  --   --   --   ALKPHOS 69  --   --   --   BILITOT 0.4  --   --   --   PROT 6.6  --   --   --   ALBUMIN 2.4* 2.3* 2.2* 2.2*   No results for input(s): LIPASE, AMYLASE in the last 168 hours. No results for input(s): AMMONIA in the last 168 hours. Coagulation Profile: No results for input(s): INR, PROTIME in the last 168 hours. Cardiac Enzymes: No results for input(s): CKTOTAL, CKMB, CKMBINDEX, TROPONINI in the last 168 hours. BNP  (last 3 results) No results for input(s): PROBNP in the last 8760 hours. HbA1C: No results for input(s): HGBA1C in the last 72 hours. CBG: Recent Labs  Lab 08/08/18 0914 08/08/18 1652 08/08/18 2147 08/09/18 0839 08/09/18 1149  GLUCAP 105* 136* 91 79 142*   Lipid Profile: No results for input(s): CHOL, HDL, LDLCALC, TRIG, CHOLHDL, LDLDIRECT in the last 72 hours. Thyroid Function Tests: No results for input(s): TSH, T4TOTAL, FREET4, T3FREE, THYROIDAB in the last 72 hours. Anemia Panel: No results for input(s): VITAMINB12, FOLATE, FERRITIN, TIBC, IRON, RETICCTPCT in the last 72 hours. Sepsis Labs: Recent Labs  Lab 08/05/18 1957  LATICACIDVEN 1.56    Recent Results (from the past 240 hour(s))  Blood culture (routine x 2)     Status: None (Preliminary result)   Collection Time: 08/05/18  9:22 PM  Result Value Ref Range Status   Specimen Description BLOOD RIGHT HAND  Final   Special Requests   Final    BOTTLES DRAWN AEROBIC ONLY Blood Culture adequate volume   Culture   Final    NO GROWTH 4 DAYS Performed at [facility], [address] [place], [place] [address]    Report Status PENDING  Incomplete  Blood culture (routine x 2)     Status: None (Preliminary result)   Collection Time: 08/05/18 11:30 PM  Result Value Ref Range Status   Specimen Description SITE NOT SPECIFIED  Final   Special Requests   Final    BOTTLES DRAWN AEROBIC AND ANAEROBIC Blood Culture adequate volume   Culture   Final    NO GROWTH 3 DAYS Performed at [facility], [address], [place], [place] [address]  Report Status PENDING  Incomplete  Aerobic/Anaerobic Culture (surgical/deep wound)     Status: None (Preliminary result)   Collection Time: 08/06/18  5:04 PM  Result Value Ref Range Status   Specimen Description WOUND LEFT ARM GRAFT  Final   Special Requests VANCOMYCON AND MAXIPINE  Final   Gram Stain   Final    FEW WBC PRESENT, PREDOMINANTLY PMN RARE GRAM POSITIVE COCCI     Culture   Final    NO GROWTH 3 DAYS NO ANAEROBES ISOLATED; CULTURE IN PROGRESS FOR 5 DAYS Performed at [facility], [place] [address], [place], [place] [address]    Report Status PENDING  Incomplete  Surgical pcr screen     Status: None   Collection Time: 08/07/18 10:50 PM  Result Value Ref Range Status   MRSA, PCR NEGATIVE NEGATIVE Final   Staphylococcus aureus NEGATIVE NEGATIVE Final    Comment: (NOTE) The Xpert SA Assay (FDA approved for NASAL specimens in patients 32 years of age and older), is one component of a comprehensive surveillance program. It is not intended to diagnose infection nor to guide or monitor treatment. Performed at [facility], [place] [address], [place], [place] [address]      Radiology Studies: Dg Chest 1 View  Result Date: 08/08/2018 CLINICAL DATA:  Status post dialysis catheter placement today. EXAM: CHEST  1 VIEW COMPARISON:  None. FINDINGS: New right IJ approach dialysis catheter is in place with its tip near the superior cavoatrial junction. No pneumothorax. There is left basilar atelectasis. The lungs are otherwise clear. No pneumothorax. Small amount of pleural fluid on the left is seen. Heart size is normal. Aortic atherosclerosis is noted. Vascular stent in the left axilla is seen. Left rib fractures are identified as seen on the comparison study. IMPRESSION: Right IJ approach dialysis catheter tip projects in the lower superior vena cava. No pneumothorax. Subsegmental atelectasis left lung base with a very small effusion. Left rib fractures.  No pneumothorax. Electronically Signed   By: Inge Rise M.D.   On: 08/08/2018 09:30    Scheduled Meds: . aspirin  81 mg Oral Daily  . Chlorhexidine Gluconate Cloth  6 each Topical Q0600  . insulin aspart  0-9 Units Subcutaneous TID WC  . insulin glargine  15 Units Subcutaneous Daily  . multivitamin with minerals  1 tablet Oral Daily  . pantoprazole  40 mg Oral BID   Continuous Infusions: .   ceFAZolin (ANCEF) IV 1 g (08/08/18 2227)  . [START ON 08/11/2018]  ceFAZolin (ANCEF) IV       LOS: 4 days   Marylu Lund, MD [facility] Pager On Amion  If 7PM-7AM, please contact night-coverage 08/09/2018, 2:34 PM

## 2018-08-10 LAB — GLUCOSE, CAPILLARY
GLUCOSE-CAPILLARY: 58 mg/dL — AB (ref 70–99)
GLUCOSE-CAPILLARY: 87 mg/dL (ref 70–99)
GLUCOSE-CAPILLARY: 46 mg/dL — AB (ref 70–99)
GLUCOSE-CAPILLARY: 109 mg/dL — AB (ref 70–99)
Glucose-Capillary: 106 mg/dL — ABNORMAL HIGH (ref 70–99)
Glucose-Capillary: 174 mg/dL — ABNORMAL HIGH (ref 70–99)
Glucose-Capillary: 235 mg/dL — ABNORMAL HIGH (ref 70–99)
Glucose-Capillary: 54 mg/dL — ABNORMAL LOW (ref 70–99)

## 2018-08-10 LAB — CULTURE, BLOOD (ROUTINE X 2)
CULTURE: NO GROWTH
Special Requests: ADEQUATE

## 2018-08-10 MED ORDER — HYDRALAZINE HCL 50 MG PO TABS
50.0000 mg | ORAL_TABLET | Freq: Three times a day (TID) | ORAL | Status: DC
  Administered 2018-08-10 – 2018-08-11 (×3): 50 mg via ORAL
  Filled 2018-08-10 (×3): qty 1

## 2018-08-10 MED ORDER — AMLODIPINE BESYLATE 5 MG PO TABS
5.0000 mg | ORAL_TABLET | Freq: Every day | ORAL | Status: DC
  Administered 2018-08-10 – 2018-08-11 (×2): 5 mg via ORAL
  Filled 2018-08-10 (×3): qty 1

## 2018-08-10 MED ORDER — CHLORHEXIDINE GLUCONATE CLOTH 2 % EX PADS
6.0000 | MEDICATED_PAD | Freq: Every day | CUTANEOUS | Status: DC
  Administered 2018-08-11: 6 via TOPICAL

## 2018-08-10 MED ORDER — DARBEPOETIN ALFA 200 MCG/0.4ML IJ SOSY
200.0000 ug | PREFILLED_SYRINGE | INTRAMUSCULAR | Status: DC
  Administered 2018-08-11: 200 ug via INTRAVENOUS
  Filled 2018-08-10: qty 0.4

## 2018-08-10 NOTE — Progress Notes (Addendum)
[facility] Progress Note   Subjective:   Seen at bedside.  Feeling well. No complaints this AM.  Objective Vitals:   08/09/18 1705 08/09/18 2001 08/10/18 0416 08/10/18 0817  BP: 136/71 139/76 (!) 178/73 (!) 157/79  Pulse: 75 82 61 75  Resp: _0 Temp: 98.3 F (36.8 C) 98.3 F (36.8 C) 98.2 F (36.8 C) (!) 97.5 F (36.4 C)  TempSrc: Oral Oral  Oral  SpO2: 98% 100% 100% 100%  Weight:      Height:       Physical Exam General:NAD, pleasant elderly male Heart:RRR Lungs:CTAB, nml WOB Abdomen:soft, NTND Extremities:no LE edema Dialysis Access: R Ij TDC, L AVG removed dressed w/ACE wrap   Filed Weights   08/08/18 1140 08/08/18 1548 08/08/18 2040  Weight: 62.1 kg 58.9 kg 62.1 kg    Intake/Output Summary (Last 24 hours) at 08/10/2018 1227 Last data filed at 08/10/2018 0900 Gross per 24 hour  Intake 1190 ml  Output -  Net 1190 ml    Additional Objective Labs: Basic Metabolic Panel: Recent Labs  Lab 08/06/18 0503 08/06/18 1435 08/07/18 0735 08/08/18 1218  NA 133* 138 134* 136  K 3.6 3.8 4.3 3.8  CL 97*  --  100 103  CO2 25  --  19* 21*  GLUCOSE 225* 95 160* 161*  BUN 33*  --  39* 44*  CREATININE 9.81*  --  11.41* 12.48*  CALCIUM 7.1*  --  7.2* 6.8*  PHOS 3.8  --  4.9* 5.1*   Liver Function Tests: Recent Labs  Lab 08/05/18 1949 08/06/18 0503 08/07/18 0735 08/08/18 1218  AST 33  --   --   --   ALT <5  --   --   --   ALKPHOS 69  --   --   --   BILITOT 0.4  --   --   --   PROT 6.6  --   --   --   ALBUMIN 2.4* 2.3* 2.2* 2.2*   CBC: Recent Labs  Lab 08/05/18 1949 08/06/18 0503 08/06/18 1435 08/08/18 1215 08/09/18 1101  WBC 7.5 6.3  --  5.9 6.1  NEUTROABS 5.4  --   --   --   --   HGB 6.8* 6.3* 8.8* 7.5* 7.6*  HCT 20.8* 19.5* 26.0* 23.3* 24.3*  MCV 105.1* 103.7*  --  104.5* 104.7*  PLT 165 155  --  176 196   CBG: Recent Labs  Lab 08/09/18 1706 08/09/18 2237 08/10/18 0527 08/10/18 0628 08/10/18 0819  GLUCAP 89 101*  58* 106* 174*   Studies/Results: No results found.  Medications: .  ceFAZolin (ANCEF) IV 1 g (08/09/18 2237)  . [START ON 08/11/2018]  ceFAZolin (ANCEF) IV     . amLODipine  5 mg Oral Daily  . aspirin  81 mg Oral Daily  . Chlorhexidine Gluconate Cloth  6 each Topical Q0600  . hydrALAZINE  50 mg Oral Q8H  . insulin aspart  0-9 Units Subcutaneous TID WC  . insulin glargine  15 Units Subcutaneous Daily  . multivitamin with minerals  1 tablet Oral Daily  . pantoprazole  40 mg Oral BID    Dialysis Orders: [facility] MWF 3 hr 45 min 180 NRe 400/Autoflow 1.5 59 kg 3.0 K/2.5 Ca UFP 4  LUA AVG -Heparin 4000 units IV TIW -Mircera 200 mcg IV q 2 weeks (Last dose 07/16/2018 HGB 10 07/25/18)  Assessment/Plan: 1.Infected AVG - s/p AVG removal on 10/23 by Dr. Donnetta Hutching-  penrose drain in place - likely to be removed tomorrow per VVS. TDC 10/25 following 36 hour line holiday. Repeat [facility] 10/22 NGTD. ID following w/ ABX recommendations of 6 weeks cefazolin - end date 09/18/18. F/u w/ID in 4wks. 2. ESRD -on HD MWF. Last K 3.8. Orders written for HD tomorrow per regular schedule w/RFP prior. 3. Anemia of CKD-Last Hgb 7.6 yesterday, s/p 1 unit pRBC on 10/23. Orders written for 255mg Aranesp w/HD tomorrow.  Recheck CBC pre HD.  Will transfuse if Hgb <7.  4. Secondary hyperparathyroidism -last CCa 8.2, Phos 5.1. Not on binders or VDRA. 5. HTN/volume -BP elevated, amlodipine and hydralazine added. Does not appear volume overloaded on exam. Per standing weights post HD yesterday, met EDW. Continue to titrate down volume as tolerated.  6. Nutrition -Alb 2.2. Renal diet w/fluid restrictions. Prostat. Renal Vit 7. DM - per primary   LJen Mow PA-C [facility] Pager: [phone]/27/2019,12:27 PM  LOS: 5 days   I have seen and examined this patient and agree with plan and assessment in the above note with renal recommendations/intervention highlighted.  HOpefully the drain can  be removed tomorrow and he can be discharged.  Plan for HD tomorrow and continue with abx for total of 6 weeks. JBroadus JohnA Indi Willhite,MD 08/10/2018 12:45 PM

## 2018-08-10 NOTE — Progress Notes (Signed)
PROGRESS NOTE    Roger Vazquez  OMV:[id number] DOB: 1951/08/24 DOA: 08/05/2018 PCP: [facility]    Brief Narrative:  67 y.o. male with medical history significant of end-stage renal disease on MWF hemodialysis, hypertension, diabetes presenting to the hospital for further evaluation of left arm swelling at the site of his AV graft.  Family states patient was discharged from the hospital on Saturday.  He then went to dialysis on Monday (yesterday) and it was noted that his left arm was swollen.  As such, patient was advised to come into the hospital for further evaluation.  He goes for hemodialysis to the [facility].  Patient has had a poor appetite since yesterday and had a fever of 102 F at home today.  He has been feeling weak. Denies having any nausea or vomiting.  He is complaining of pain in his left arm at the site of the AV graft.  Patient was admitted from 10/13-10/19 for sepsis with MSSA bacteremia, unclear source.  C. difficile panel negative for diarrhea.  Left arm dialysis graft site was stable on exam.  He was treated with Ancef and ID was on board.  He had stable TTE and TEE.  Left AV graft ultrasound was also stable.  He was discharged with IV cefazolin for 10 more days of his dialysis treatments (stop date October 31).   ED Course: Temperature 100.5 F.  Remainder of vitals stable.  No leukocytosis.  Lactic acid normal.  Hemoglobin 6.8; was 7.6 on discharge 4 days ago.  Chest x-ray showing no acute finding.  2 units of packed red blood cells ordered in the ED.  TRH patient admitted.  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  No tachycardia or hypotension. No leukocytosis.  Lactic acid normal. -Spoke to nephrology (Dr. Justin Mend), their team will see the patient in the morning -Continue broad-spectrum antibiotics-continued on vancomycin and cefepime -blood cultures thus far negative -Fentanyl PRN pain, Tylenol PRN -Seen by Vascular Surgery, s/p removal of graft. Now s/p [facility] 10/25 -No PT needs noted per therapy. -Drain gradually being pulled. Continue per Vascular Surrgery  Recent MSSA bacteremia -Patient was admitted from 10/13-10/19 for sepsis with MSSA bacteremia, unclear source.  -C. difficile panel negative for diarrhea.   -Left arm dialysis graft site was stable on exam at that time.   -Pt was treated with Ancef and ID was on board.  He had stable TTE and TEE.  Left AV graft ultrasound was also stable.   -Patient was discharged with IV cefazolin for 10 more days of his dialysis treatments (stop date October 31). -Continued on broad-spectrum antibiotics per above -continued on vancomycin and cefepime -Blood culture thus far neg -ID following Recommendation for ancef with goal of 6 weeks of therapy with hemodialysis, stop date 09/18/18  Symptomatic anemia -Hemodynamically stable  Hypokalemia -normalized -Continue to follow  ESRD on hemodialysis Monday Wednesday Friday -Currently not volume overloaded -Nephrology following  Hypertension -Hold home hydralazine and amlodipine at this time. -BP remains stable currently  Type 2 diabetes -  A1c noted to be 8.4 -Continue home Lantus 15 units daily -Sliding scale insulin sensitive -Continue to follow blood glucose  GERD -Continue home Protonix -remains stable currently  DVT prophylaxis: heparin subQ Code Status: DNR Family Communication: Pt in room, family at bedside Disposition Plan: Uncertain at this time  Consultants:   Nephrology,  ID, vascular surgery  Procedures:     Antimicrobials: Anti-infectives (From admission, onward)   Start     Dose/Rate Route Frequency Ordered Stop   08/11/18 1200  ceFAZolin (ANCEF) IVPB 2g/100 mL premix     2 g 200 mL/hr over 30 Minutes Intravenous Every M-W-F (Hemodialysis) 08/09/18 1132 09/19/18 1159   08/07/18 2200  ceFAZolin (ANCEF) IVPB 1 g/50 mL premix     1 g 100 mL/hr over 30 Minutes Intravenous Every 24 hours 08/07/18 1049 08/10/18 2359   08/06/18 2200  ceFAZolin (ANCEF) IVPB 1 g/50 mL premix  Status:  Discontinued     1 g 100 mL/hr over 30 Minutes Intravenous Every 24 hr x 2 08/06/18 2144 08/07/18 1049   08/06/18 1200  ceFEPIme (MAXIPIME) 2 g in sodium chloride 0.9 % 100 mL IVPB  Status:  Discontinued     2 g 200 mL/hr over 30 Minutes Intravenous Every M-W-F (Hemodialysis) 08/05/18 2124 08/06/18 2131   08/06/18 1200  vancomycin (VANCOCIN) IVPB 500 mg/100 ml premix  Status:  Discontinued     500 mg 100 mL/hr over 60 Minutes Intravenous Every M-W-F (Hemodialysis) 08/05/18 2124 08/07/18 1047   08/05/18 2200  vancomycin (VANCOCIN) 1,250 mg in sodium chloride 0.9 % 250 mL IVPB     1,250 mg 166.7 mL/hr over 90 Minutes Intravenous  Once 08/05/18 2108 08/06/18 0123   08/05/18 2130  ceFEPIme (MAXIPIME) 1 g in sodium chloride 0.9 % 100 mL IVPB     1 g 200 mL/hr over 30 Minutes Intravenous  Once 08/05/18 2043 08/06/18 0002   08/05/18 2045  vancomycin (VANCOCIN) 1,178 mg in sodium chloride 0.9 % 500 mL IVPB  Status:  Discontinued     20 mg/kg  58.9 kg 250 mL/hr over 120 Minutes Intravenous  Once 08/05/18 2043 08/05/18 2108      Subjective: No complaints today. Eager to go home  Objective: Vitals:   08/10/18 0416 08/10/18 0817 08/10/18 1348 08/10/18 1349  BP: (!) 178/73 (!) 157/79 (!) 149/71 (!) 149/71  Pulse: 61 75 68   Resp: 18 18    Temp: 98.2 F (36.8 C) (!) 97.5 F (36.4 C)    TempSrc:  Oral    SpO2: 100% 100%    Weight:      Height:        Intake/Output  Summary (Last 24 hours) at 08/10/2018 1534 Last data filed at 08/10/2018 0900 Gross per 24 hour  Intake 890 ml  Output -  Net 890 ml   Filed Weights   08/08/18 1140 08/08/18 1548 08/08/18 2040  Weight: 62.1 kg 58.9 kg 62.1 kg    Examination: General exam: Conversant, in no acute distress Respiratory system: normal chest rise, clear, no audible wheezing Cardiovascular system: regular rhythm, s1-s2  Data Reviewed: I have personally reviewed following labs and imaging studies  CBC: Recent Labs  Lab 08/05/18 1949 08/06/18 0503 08/06/18 1435 08/08/18 1215 08/09/18 1101  WBC 7.5 6.3  --  5.9 6.1  NEUTROABS 5.4  --   --   --   --   HGB 6.8* 6.3* 8.8* 7.5* 7.6*  HCT 20.8* 19.5* 26.0* 23.3* 24.3*  MCV 105.1* 103.7*  --  104.5* 104.7*  PLT 165 155  --  176 419   Basic Metabolic Panel: Recent Labs  Lab 08/05/18 1949 08/05/18 2348 08/06/18 0503 08/06/18 1435 08/07/18 0735 08/08/18 1218  NA 132*  --  133* 138 134* 136  K 3.2*  --  3.6 3.8 4.3 3.8  CL 94*  --  97*  --  100 103  CO2 26  --  25  --  19* 21*  GLUCOSE 320*  --  225* 95 160* 161*  BUN 31*  --  33*  --  39* 44*  CREATININE 9.33*  --  9.81*  --  11.41* 12.48*  CALCIUM 7.2*  --  7.1*  --  7.2* 6.8*  MG  --  2.0  --   --   --   --   PHOS  --   --  3.8  --  4.9* 5.1*   GFR: Estimated Creatinine Clearance: 5 mL/min (A) (by C-G formula based on SCr of 12.48 mg/dL (H)). Liver Function Tests: Recent Labs  Lab 08/05/18 1949 08/06/18 0503 08/07/18 0735 08/08/18 1218  AST 33  --   --   --   ALT <5  --   --   --   ALKPHOS 69  --   --   --   BILITOT 0.4  --   --   --   PROT 6.6  --   --   --   ALBUMIN 2.4* 2.3* 2.2* 2.2*   No results for input(s): LIPASE, AMYLASE in the last 168 hours. No results for input(s): AMMONIA in the last 168 hours. Coagulation Profile: No results for input(s): INR, PROTIME in the last 168 hours. Cardiac Enzymes: No results for input(s): CKTOTAL, CKMB, CKMBINDEX, TROPONINI in the  last 168 hours. BNP (last 3 results) No results for input(s): PROBNP in the last 8760 hours. HbA1C: No results for input(s): HGBA1C in the last 72 hours. CBG: Recent Labs  Lab 08/09/18 2237 08/10/18 0527 08/10/18 0628 08/10/18 0819 08/10/18 1154  GLUCAP 101* 58* 106* 174* 235*   Lipid Profile: No results for input(s): CHOL, HDL, LDLCALC, TRIG, CHOLHDL, LDLDIRECT in the last 72 hours. Thyroid Function Tests: No results for input(s): TSH, T4TOTAL, FREET4, T3FREE, THYROIDAB in the last 72 hours. Anemia Panel: No results for input(s): VITAMINB12, FOLATE, FERRITIN, TIBC, IRON, RETICCTPCT in the last 72 hours. Sepsis Labs: Recent Labs  Lab 08/05/18 1957  LATICACIDVEN 1.56    Recent Results (from the past 240 hour(s))  Blood culture (routine x 2)     Status: None   Collection Time: 08/05/18  9:22 PM  Result Value Ref Range Status   Specimen Description BLOOD RIGHT HAND  Final   Special Requests   Final    BOTTLES DRAWN AEROBIC ONLY Blood Culture adequate volume   Culture   Final    NO GROWTH 5 DAYS Performed at [facility], [address], [place], [place] [address]    Report Status 08/10/2018 FINAL  Final  Blood culture (routine x 2)     Status: None (Preliminary result)   Collection Time: 08/05/18 11:30 PM  Result Value Ref Range Status   Specimen Description SITE NOT SPECIFIED  Final   Special Requests   Final    BOTTLES DRAWN AEROBIC AND ANAEROBIC Blood Culture adequate volume   Culture   Final    NO GROWTH 4 DAYS Performed at [facility], [address], [place], [place] [address]    Report  Status PENDING  Incomplete  Aerobic/Anaerobic Culture (surgical/deep wound)     Status: None (Preliminary result)   Collection Time: 08/06/18  5:04 PM  Result Value Ref Range Status   Specimen Description WOUND LEFT ARM GRAFT  Final   Special Requests VANCOMYCON AND MAXIPINE  Final   Gram Stain   Final    FEW WBC PRESENT, PREDOMINANTLY PMN RARE GRAM POSITIVE  COCCI    Culture   Final    NO GROWTH 4 DAYS NO ANAEROBES ISOLATED; CULTURE IN PROGRESS FOR 5 DAYS Performed at [facility], [place] [address] [place], [place] [address]    Report Status PENDING  Incomplete  Surgical pcr screen     Status: None   Collection Time: 08/07/18 10:50 PM  Result Value Ref Range Status   MRSA, PCR NEGATIVE NEGATIVE Final   Staphylococcus aureus NEGATIVE NEGATIVE Final    Comment: (NOTE) The Xpert SA Assay (FDA approved for NASAL specimens in patients 51 years of age and older), is one component of a comprehensive surveillance program. It is not intended to diagnose infection nor to guide or monitor treatment. Performed at [facility], [place] [address], [place], [place] [address]      Radiology Studies: No results found.  Scheduled Meds: . amLODipine  5 mg Oral Daily  . aspirin  81 mg Oral Daily  . Chlorhexidine Gluconate Cloth  6 each Topical Q0600  . [START ON 08/11/2018] Chlorhexidine Gluconate Cloth  6 each Topical Q0600  . [START ON 08/11/2018] darbepoetin (ARANESP) injection - DIALYSIS  200 mcg Intravenous Q Mon-HD  . hydrALAZINE  50 mg Oral Q8H  . insulin aspart  0-9 Units Subcutaneous TID WC  . insulin glargine  15 Units Subcutaneous Daily  . multivitamin with minerals  1 tablet Oral Daily  . pantoprazole  40 mg Oral BID   Continuous Infusions: .  ceFAZolin (ANCEF) IV 1 g (08/09/18 2237)  . [START ON 08/11/2018]  ceFAZolin (ANCEF) IV       LOS: 5 days   Marylu Lund, MD [facility] Pager On Amion  If 7PM-7AM, please contact night-coverage 08/10/2018, 3:34 PM

## 2018-08-10 NOTE — Progress Notes (Signed)
  Progress Note    08/10/2018 9:49 AM 1 Day Post-Op  Subjective:  No complaints this morning.  RIJ [place] working well.   Vitals:   08/10/18 0416 08/10/18 0817  BP: (!) 178/73 (!) 157/79  Pulse: 61 75  Resp: 18 18  Temp: 98.2 F (36.8 C) (!) 97.5 F (36.4 C)  SpO2: 100% 100%   Physical Exam: Lungs:  Non labored Incisions:  L axilla incision unremarkable, skin edges approximated well; upper arm incision without drainage, skin edges approximated well; mid arm incision with serosanguinous drainage, no purulence, drain in place; grip strength and sensation intact L hand; TDC site unremarkable Abdomen:  Soft Neurologic: A&O  CBC    Component Value Date/Time   WBC 6.1 08/09/2018 1101   RBC 2.32 (L) 08/09/2018 1101   HGB 7.6 (L) 08/09/2018 1101   HCT 24.3 (L) 08/09/2018 1101   PLT 196 08/09/2018 1101   MCV 104.7 (H) 08/09/2018 1101   MCH 32.8 08/09/2018 1101   MCHC 31.3 08/09/2018 1101   RDW 14.5 08/09/2018 1101   LYMPHSABS 1.2 08/05/2018 1949   MONOABS 0.8 08/05/2018 1949   EOSABS 0.0 08/05/2018 1949   BASOSABS 0.0 08/05/2018 1949    BMET    Component Value Date/Time   NA 136 08/08/2018 1218   K 3.8 08/08/2018 1218   CL 103 08/08/2018 1218   CO2 21 (L) 08/08/2018 1218   GLUCOSE 161 (H) 08/08/2018 1218   BUN 44 (H) 08/08/2018 1218   CREATININE 12.48 (H) 08/08/2018 1218   CALCIUM 6.8 (L) 08/08/2018 1218   CALCIUM 7.1 (L) 08/01/2018 0944   GFRNONAA 4 (L) 08/08/2018 1218   GFRAA 4 (L) 08/08/2018 1218    INR    Component Value Date/Time   INR 1.08 07/31/2018 0656     Intake/Output Summary (Last 24 hours) at 08/10/2018 0949 Last data filed at 08/10/2018 0600 Gross per 24 hour  Intake 770 ml  Output -  Net 770 ml     Assessment/Plan:  67 y.o. male is s/p removal of infected L arm loop graft with subsequent placement of R IJ TDC  Pulled LUE penrose drain back more today.  Dressing changed at bedside.  Will likely remove drain tomorrow and will just need  kerlix dressing changes to left upper arm.  [facility] working well.  Marty Heck, MD [facility] [place] Office: [phone] Pager: [place]

## 2018-08-11 ENCOUNTER — Telehealth: Payer: Self-pay | Admitting: Vascular Surgery

## 2018-08-11 LAB — CBC
HCT: 23.8 % — ABNORMAL LOW (ref 39.0–52.0)
Hemoglobin: 7.6 g/dL — ABNORMAL LOW (ref 13.0–17.0)
MCH: 32.8 pg (ref 26.0–34.0)
MCHC: 31.9 g/dL (ref 30.0–36.0)
MCV: 102.6 fL — ABNORMAL HIGH (ref 80.0–100.0)
NRBC: 0 % (ref 0.0–0.2)
PLATELETS: 221 10*3/uL (ref 150–400)
RBC: 2.32 MIL/uL — AB (ref 4.22–5.81)
RDW: 14.3 % (ref 11.5–15.5)
WBC: 6.8 10*3/uL (ref 4.0–10.5)

## 2018-08-11 LAB — GLUCOSE, CAPILLARY
GLUCOSE-CAPILLARY: 109 mg/dL — AB (ref 70–99)
GLUCOSE-CAPILLARY: 57 mg/dL — AB (ref 70–99)
GLUCOSE-CAPILLARY: 52 mg/dL — AB (ref 70–99)
GLUCOSE-CAPILLARY: 65 mg/dL — AB (ref 70–99)
GLUCOSE-CAPILLARY: 71 mg/dL (ref 70–99)
Glucose-Capillary: 83 mg/dL (ref 70–99)
Glucose-Capillary: 217 mg/dL — ABNORMAL HIGH (ref 70–99)
Glucose-Capillary: 178 mg/dL — ABNORMAL HIGH (ref 70–99)
Glucose-Capillary: 33 mg/dL — CL (ref 70–99)
Glucose-Capillary: 61 mg/dL — ABNORMAL LOW (ref 70–99)

## 2018-08-11 LAB — RENAL FUNCTION PANEL
Albumin: 2.4 g/dL — ABNORMAL LOW (ref 3.5–5.0)
Anion gap: 11 (ref 5–15)
BUN: 43 mg/dL — AB (ref 8–23)
CALCIUM: 7.9 mg/dL — AB (ref 8.9–10.3)
CO2: 26 mmol/L (ref 22–32)
Chloride: 95 mmol/L — ABNORMAL LOW (ref 98–111)
Creatinine, Ser: 10.88 mg/dL — ABNORMAL HIGH (ref 0.61–1.24)
GFR, EST AFRICAN AMERICAN: 5 mL/min — AB (ref >60)
GFR, EST NON AFRICAN AMERICAN: 4 mL/min — AB (ref >60)
Glucose, Bld: 108 mg/dL — ABNORMAL HIGH (ref 70–99)
Phosphorus: 5.3 mg/dL — ABNORMAL HIGH (ref 2.5–4.6)
Potassium: 5.2 mmol/L — ABNORMAL HIGH (ref 3.5–5.1)
SODIUM: 132 mmol/L — AB (ref 135–145)

## 2018-08-11 LAB — CULTURE, BLOOD (ROUTINE X 2)
Culture: NO GROWTH
Special Requests: ADEQUATE

## 2018-08-11 LAB — HEPATITIS B SURFACE ANTIGEN: Hepatitis B Surface Ag: NEGATIVE

## 2018-08-11 MED ORDER — ALTEPLASE 2 MG IJ SOLR: 2.0000 mg | Freq: Once | INTRAMUSCULAR | Status: DC | PRN

## 2018-08-11 MED ORDER — HEPARIN SODIUM (PORCINE) 1000 UNIT/ML DIALYSIS
1000.0000 [IU] | INTRAMUSCULAR | Status: DC | PRN
  Administered 2018-08-11: 3000 [IU] via INTRAVENOUS_CENTRAL
  Filled 2018-08-11: qty 1

## 2018-08-11 MED ORDER — PENTAFLUOROPROP-TETRAFLUOROETH EX AERO: 1.0000 "application " | INHALATION_SPRAY | CUTANEOUS | Status: DC | PRN

## 2018-08-11 MED ORDER — CEFAZOLIN SODIUM-DEXTROSE 2-4 GM/100ML-% IV SOLN: 2.0000 g | INTRAVENOUS | Status: AC

## 2018-08-11 MED ORDER — DARBEPOETIN ALFA 200 MCG/0.4ML IJ SOSY
PREFILLED_SYRINGE | INTRAMUSCULAR | Status: AC
  Administered 2018-08-11: 200 ug via INTRAVENOUS
  Filled 2018-08-11: qty 0.4

## 2018-08-11 MED ORDER — LIDOCAINE-PRILOCAINE 2.5-2.5 % EX CREA: 1.0000 "application " | TOPICAL_CREAM | CUTANEOUS | Status: DC | PRN

## 2018-08-11 MED ORDER — LIDOCAINE HCL (PF) 1 % IJ SOLN: 5.0000 mL | INTRAMUSCULAR | Status: DC | PRN

## 2018-08-11 MED ORDER — SODIUM CHLORIDE 0.9 % IV SOLN: 100.0000 mL | INTRAVENOUS | Status: DC | PRN

## 2018-08-11 MED ORDER — GLUCOSE 40 % PO GEL
ORAL | Status: AC
  Administered 2018-08-11: 37.5 g
  Filled 2018-08-11: qty 1

## 2018-08-11 MED ORDER — HEPARIN SODIUM (PORCINE) 1000 UNIT/ML IJ SOLN
INTRAMUSCULAR | Status: AC
  Administered 2018-08-11: 3000 [IU] via INTRAVENOUS_CENTRAL
  Filled 2018-08-11: qty 3

## 2018-08-11 MED ORDER — CEFAZOLIN SODIUM-DEXTROSE 2-4 GM/100ML-% IV SOLN: 2.0000 g | INTRAVENOUS | Status: DC

## 2018-08-11 NOTE — Telephone Encounter (Signed)
-----   Message from Marty Heck, MD sent at 08/11/2018 10:51 AM EDT ----- Can you arrange wound check for Mr. Desa in [place] clinic in 2 weeks.  Had left arm graft removed by Dr. Donnetta Hutching.  Thanks,  Gerald Stabs

## 2018-08-11 NOTE — Progress Notes (Signed)
 [facility] Progress Note   Subjective: Seen on HD. C/O of being cold, feels "sore all over". NAD     Objective Vitals:   08/11/18 0510 08/11/18 0816 08/11/18 0833 08/11/18 0900  BP: (!) 165/83 (!) 173/92 (!) 166/83 131/80  Pulse: 76 77 71 74  Resp: 18 18    Temp: 97.9 F (36.6 C) 98.6 F (37 C)    TempSrc: Oral Oral    SpO2: 100% 99%    Weight:  60.2 kg    Height:       Physical Exam General: Pleasant elderly male in NAD Heart: S1,S2, RRR Lungs: CTAB Anteriorly Abdomen: soft, NT Extremities: No LE edema Dialysis Access: RIJ TDC blood lines connected.    Additional Objective Labs: Basic Metabolic Panel: Recent Labs  Lab 08/06/18 0503 08/06/18 1435 08/07/18 0735 08/08/18 1218  NA 133* 138 134* 136  K 3.6 3.8 4.3 3.8  CL 97*  --  100 103  CO2 25  --  19* 21*  GLUCOSE 225* 95 160* 161*  BUN 33*  --  39* 44*  CREATININE 9.81*  --  11.41* 12.48*  CALCIUM 7.1*  --  7.2* 6.8*  PHOS 3.8  --  4.9* 5.1*   Liver Function Tests: Recent Labs  Lab 08/05/18 1949 08/06/18 0503 08/07/18 0735 08/08/18 1218  AST 33  --   --   --   ALT <5  --   --   --   ALKPHOS 69  --   --   --   BILITOT 0.4  --   --   --   PROT 6.6  --   --   --   ALBUMIN 2.4* 2.3* 2.2* 2.2*   No results for input(s): LIPASE, AMYLASE in the last 168 hours. CBC: Recent Labs  Lab 08/05/18 1949 08/06/18 0503  08/08/18 1215 08/09/18 1101 08/11/18 0857  WBC 7.5 6.3  --  5.9 6.1 6.8  NEUTROABS 5.4  --   --   --   --   --   HGB 6.8* 6.3*   < > 7.5* 7.6* 7.6*  HCT 20.8* 19.5*   < > 23.3* 24.3* 23.8*  MCV 105.1* 103.7*  --  104.5* 104.7* 102.6*  PLT 165 155  --  176 196 221   < > = values in this interval not displayed.   Blood Culture    Component Value Date/Time   SDES WOUND LEFT ARM GRAFT 08/06/2018 1704   SPECREQUEST VANCOMYCON AND MAXIPINE 08/06/2018 1704   CULT  08/06/2018 1704    NO GROWTH 4 DAYS NO ANAEROBES ISOLATED; CULTURE IN PROGRESS FOR 5 DAYS Performed at [facility], [place] [address] [place], [place] [address]    REPTSTATUS PENDING 08/06/2018 1704    Cardiac Enzymes: No results for input(s): CKTOTAL, CKMB, CKMBINDEX, TROPONINI in the last 168 hours. CBG: Recent Labs  Lab 08/10/18 2110 08/10/18 2157 08/10/18 2233 08/11/18 0514 08/11/18 0738  GLUCAP 46* 54* 109* 83 109*   Iron Studies: No results for input(s): IRON, TIBC, TRANSFERRIN, FERRITIN in the last 72 hours. @lablastinr3 @ Studies/Results: No results found. Medications: . sodium chloride    . sodium chloride    .  ceFAZolin (ANCEF) IV     . amLODipine  5 mg Oral Daily  . aspirin  81 mg Oral Daily  . Chlorhexidine Gluconate Cloth  6 each Topical Q0600  . Chlorhexidine Gluconate Cloth  6 each Topical Q0600  . darbepoetin (ARANESP) injection - DIALYSIS  200 mcg Intravenous Q Mon-HD  . hydrALAZINE  50 mg Oral Q8H  . insulin aspart  0-9 Units Subcutaneous TID WC  . insulin glargine  15 Units Subcutaneous Daily  . multivitamin with minerals  1 tablet Oral Daily  . pantoprazole  40 mg Oral BID     Dialysis Orders: [place] MWF 3 hr 45 min 180 NRe 400/Autoflow 1.5 59 kg 3.0 K/2.5 Ca UFP 4  LUA AVG -Heparin 4000 units IV TIW -Mircera 200 mcg IV q 2 weeks (Last dose 07/16/2018 HGB 10 07/25/18)  Assessment/Plan: 1.Infected AVG - s/p AVG removal on 10/23 by Dr. Donnetta Hutching- penrose drain in place - likely to be removed tomorrow per [facility]VS. TDC10/50following 36 hour line holiday. Repeat [facility] 10/22 NGTD. ID following w/ ABX recommendations of 6 weeks cefazolin - end date 09/18/18. F/u w/ID in 4wks. 2. ESRD -on HD MWF. HD today on schedule.  3. Anemia of CKD-Last Hgb7.6 yesterday, s/p1unit pRBC on 10/23. Orders written for 248mcg Aranesp w/HD today.Will transfuse if Hgb <7.  4. Secondary hyperparathyroidism -last CCa 8.2, Phos5.1. Not on binders or VDRA. 5. HTN/volume -BP elevated, amlodipine and hydralazine added. Does not appear volume overloaded on exam.UFG 2.5 6.  Nutrition -Alb 2.2. Renal diet w/fluid restrictions. Prostat. Renal Vit 7. DM - per primary   H.  NP-C 08/11/2018, 9:28 AM  [employer] [phone]

## 2018-08-11 NOTE — Progress Notes (Signed)
Hypoglycemic Event   08/10/18  Time:  2110  CBG: 46  Treatment: Snack  Symptoms: Sweating  Time:  2157  Follow-up CBG: 54    Treatment: Another snack  Time: 2233  CBG Result 109  Possible Reasons for Event: Poor PO intake

## 2018-08-11 NOTE — Progress Notes (Signed)
  Progress Note    08/11/2018 10:48 AM 1 Day Post-Op  Subjective:  No complaints this morning.  RIJ [place] working well.   Vitals:   08/11/18 1000 08/11/18 1030  BP: 107/62 120/66  Pulse: (!) 102 95  Resp:    Temp:    SpO2:     Physical Exam: Lungs:  Non labored Incisions:  L axilla incision unremarkable, skin edges approximated well; upper arm incision without drainage, skin edges approximated well; mid arm incision with serosanguinous drainage, no purulence, drain in place; grip strength and sensation intact L hand; TDC site unremarkable Abdomen:  Soft Neurologic: A&O  CBC    Component Value Date/Time   WBC 6.8 08/11/2018 0857   RBC 2.32 (L) 08/11/2018 0857   HGB 7.6 (L) 08/11/2018 0857   HCT 23.8 (L) 08/11/2018 0857   PLT 221 08/11/2018 0857   MCV 102.6 (H) 08/11/2018 0857   MCH 32.8 08/11/2018 0857   MCHC 31.9 08/11/2018 0857   RDW 14.3 08/11/2018 0857   LYMPHSABS 1.2 08/05/2018 1949   MONOABS 0.8 08/05/2018 1949   EOSABS 0.0 08/05/2018 1949   BASOSABS 0.0 08/05/2018 1949    BMET    Component Value Date/Time   NA 132 (L) 08/11/2018 0858   K 5.2 (H) 08/11/2018 0858   CL 95 (L) 08/11/2018 0858   CO2 26 08/11/2018 0858   GLUCOSE 108 (H) 08/11/2018 0858   BUN 43 (H) 08/11/2018 0858   CREATININE 10.88 (H) 08/11/2018 0858   CALCIUM 7.9 (L) 08/11/2018 0858   CALCIUM 7.1 (L) 08/01/2018 0944   GFRNONAA 4 (L) 08/11/2018 0858   GFRAA 5 (L) 08/11/2018 0858    INR    Component Value Date/Time   INR 1.08 07/31/2018 0656     Intake/Output Summary (Last 24 hours) at 08/11/2018 1048 Last data filed at 08/11/2018 0900 Gross per 24 hour  Intake 1200 ml  Output 0 ml  Net 1200 ml     Assessment/Plan:  67 y.o. male is s/p removal of infected L arm loop graft with subsequent placement of R IJ TDC  Will plan to remove penrose today.  Continue dressing changes to left arm for ongoing drainage.  Will need f/u in clinic for wound check in next several  weeks.  Marty Heck, MD [facility] [place] Office: [phone] Pager: [place]

## 2018-08-11 NOTE — Progress Notes (Signed)
Patient transported to main entrance as patient has been discharged by MD.  Patient sister called me and was concerned about patient's blood sugar of 71 upon discharge. I informed patient about sisters concerns , however patient insisted I take him to the main entrance and he feels ok to go home.  I inquired from patient what he eat for dinner and he verbalized to me he did not eat dinner. Patient informed me his blood sugar was low earlier on but he ate some gram crackers and coke to bring his blood sugar up. Patient belongings given to him .

## 2018-08-11 NOTE — Care Management Important Message (Signed)
Important Message  Patient Details  Name: Roger Vazquez MRN: [id number] Date of Birth: [birth year]   Medicare Important Message Given:  Yes    Lenia Housley [address] 08/11/2018, 3:56 PM

## 2018-08-11 NOTE — Telephone Encounter (Signed)
sch appt spk to pt 08/25/18 3pm p/o PA

## 2018-08-11 NOTE — Discharge Summary (Signed)
Physician Discharge Summary  Roger Vazquez ZOX:[id number] DOB: December 14, 1950 DOA: 08/05/2018  PCP: [facility]  Admit date: 08/05/2018 Discharge date: 08/11/2018  Admitted From: Home Disposition:  Home  Recommendations for Outpatient Follow-up:  1. Follow up with PCP in 1-2 weeks 2. Follow up with Infectious Disease as scheduled 3. Follow up with Vascular Surgery as scheduled 4. Continue cefazolin with dialysis through 09/18/18   Discharge Condition:Improved CODE STATUS:DNR Diet recommendation: Diabetic   Brief/Interim Summary: 67 y.o.malewith medical history significant ofend-stage renal disease onMWFhemodialysis, hypertension, diabetes presenting to the hospital for further evaluation of left arm swelling at the site of his AV graft.Family states patient was discharged from the hospital on Saturday. He then went to dialysis on Monday (yesterday) and it was noted that his left arm was swollen. As such, patient was advised to come into the hospital for further evaluation. [facility]. Patient has had a poor appetite since yesterday and had a fever of 102 F at home today. He has been feeling weak. Denies having any nausea or vomiting. He is complaining of pain in his left arm at the site of the AV graft.  Patient was admitted from10/13-10/97fr sepsis with MSSA bacteremia,unclear source. C. difficile panel negative for diarrhea. Left arm dialysis graft site was stable on exam. He was treated with Ancef and ID was on board. He had stable TTE and TEE. Left AV graft ultrasound was also stable.He was discharged with IV cefazolin for 10 more days of his dialysis treatments (stopdate October 31).  ED Course:Temperature 100.5 F. Remainder of vitalsstable. No leukocytosis. Lactic acid normal. Hemoglobin 6.8;was 7.6ondischarge 4 days ago. Chest x-ray showing no acute finding. 2 units of packed red blood cells  orderedin the ED. TRH patient admitted.  Suspected infection ofleft AV graft -Temp100.5 F at time of presentation. No tachycardia or hypotension.No leukocytosis. Lactic acid normal. -Spoke to nephrology (Dr. WBurnis Kingfisherteamwill see the patient in the morning -Continue broad-spectrum antibiotics-continued on vancomycin and cefepime -blood cultures thus far negative -Fentanyl PRN pain,Tylenol PRN -Seen by Vascular Surgery, s/p removal of graft. [facility]No PT needs noted per therapy. -Drain pulled 10/28 by Vascular Surrgery with recommendations for daliy wet to dry dressings -Outpatient follow up with Vascular Surgery on 11/11  Recent MSSA bacteremia -Patient was admitted from10/13-10/177f sepsis with MSSA bacteremia,unclear source. -C. difficile panel negative for diarrhea.  -Left arm dialysis graft site was stable on exam at that time.  -Pt was treated with Ancef and ID was on board. He had stable TTE and TEE. Left AV graft ultrasound was also stable. -continued on vancomycin and cefepime initially -Blood culture thus far neg -ID had been following Recommendation for ancef with goal of 6 weeks of therapy with hemodialysis, stop date 09/18/18  Symptomaticanemia -Hemodynamically stable  Hypokalemia -normalized -Continue to follow  ESRD on hemodialysis Monday Wednesday Friday -Currently not volume overloaded -Nephrology following this admission  Hypertension -Hold home hydralazine and amlodipine at this time. -BP remains stable currently  Type 2 diabetes - A1c noted to be 8.4 -Continue home Lantus 15 units daily -Sliding scale insulin sensitive while in hospital -Continue to follow blood glucose  GERD -Continue home Protonix -remains stable currently   Discharge Diagnoses:  Principal Problem:   Infection of AV graft for dialysis ([facility] Problems:   Type 2 diabetes mellitus with chronic kidney disease on chronic dialysis  ([place]  HTN (hypertension)   Bacteremia due to methicillin susceptible Staphylococcus aureus (  MSSA)   ESRD (end stage renal disease) (HCC)   Symptomatic anemia   Hypokalemia   GERD (gastroesophageal reflux disease)   Fever    Discharge Instructions   Allergies as of 08/11/2018   No Known Allergies     Medication List    STOP taking these medications   ceFAZolin  IVPB Commonly known as:  ANCEF     TAKE these medications   amLODipine 10 MG tablet Commonly known as:  NORVASC Take 5 mg by mouth daily.   aspirin 81 MG tablet Take 81 mg by mouth daily.   blood glucose meter kit and supplies Kit Dispense based on patient and insurance preference. Use up to four times daily as directed. (FOR ICD-9 250.00, 250.01). For QAC - HS accuchecks.   ceFAZolin 2-4 GM/100ML-% IVPB Commonly known as:  ANCEF Inject 100 mLs (2 g total) into the vein every Monday, Wednesday, and Friday with hemodialysis. Start taking on:  08/13/2018   hydrALAZINE 25 MG tablet Commonly known as:  APRESOLINE Take 2 tablets (50 mg total) by mouth every 8 (eight) hours.   insulin aspart 100 UNIT/ML injection Commonly known as:  novoLOG Before each meal 3 times a day, 140-199 - 2 units, 200-250 - 4 units, 251-299 - 6 units,  300-349 - 8 units,  350 or above 10 units. Dispense syringes and needles as needed, Ok to switch to PEN if approved. Substitute to any brand approved. DX DM2, Code E11.65   insulin glargine 100 UNIT/ML injection Commonly known as:  LANTUS Inject 0.15 mLs (15 Units total) into the skin daily. Dispense insulin pen if approved, if not dispense as needed syringes and needles for 1 month supply. Can switch to Levemir. Diagnosis E 11.65.   Insulin Syringe-Needle U-100 25G X 1" 1 ML Misc For 4 times a day insulin SQ, 1 month supply. Diagnosis E11.65   multivitamin with minerals Tabs tablet Take 1 tablet by mouth daily.   pantoprazole 40 MG tablet Commonly known as:  PROTONIX Take 1  tablet (40 mg total) by mouth 2 (two) times daily.      Follow-up Information    [place], FNP Follow up.   Specialties:  Family Medicine, Infectious Diseases Why:  09/15/18 @ 9:30 am. If you are unable to make this appointment please call office to reschedule.  Contact information: [address] [address] [phone]        [facility], [place] Schedule an appointment as soon as possible for a visit in 2 week(s).   Contact information: [place] [address] [phone]        Marty Heck, MD Follow up on 08/25/2018.   Specialty:  Vascular Surgery Why:  as scheduled Contact information: [place] [address] [phone]          No Known Allergies  Consultations:  Nephrology  ID  Vascular Surgery  Procedures/Studies: Dg Chest 1 View  Result Date: 08/08/2018 CLINICAL DATA:  Status post dialysis catheter placement today. EXAM: CHEST  1 VIEW COMPARISON:  None. FINDINGS: New right IJ approach dialysis catheter is in place with its tip near the superior cavoatrial junction. No pneumothorax. There is left basilar atelectasis. The lungs are otherwise clear. No pneumothorax. Small amount of pleural fluid on the left is seen. Heart size is normal. Aortic atherosclerosis is noted. Vascular stent in the left axilla is seen. Left rib fractures are identified as seen on the comparison study.  IMPRESSION: Right IJ approach dialysis catheter tip projects in the lower superior vena cava. No pneumothorax. Subsegmental atelectasis left lung base with a very small effusion. Left rib fractures.  No pneumothorax. Electronically Signed   By: Inge Rise M.D.   On: 08/08/2018 09:30   Dg Chest Portable 1 View  Result Date: 07/27/2018 CLINICAL DATA:  Sepsis. Fever and altered mental status. Hypertension and diabetes. Ex-smoker. EXAM: PORTABLE CHEST 1 VIEW COMPARISON:  02/14/2011 FINDINGS:  Left axillary vascular stent. Numerous leads and wires project over the chest. Midline trachea. Normal heart size. Atherosclerosis in the transverse aorta. No pleural effusion or pneumothorax. Mild left hemidiaphragm elevation. Clear lungs. Interval but nonacute left rib deformities could be posttraumatic or postsurgical. IMPRESSION: No acute findings. Aortic Atherosclerosis (ICD10-I70.0). Electronically Signed   By: Abigail Miyamoto M.D.   On: 07/27/2018 12:10   [place] Lt Upper Extrem Ltd Soft Tissue Non Vascular  Result Date: 08/06/2018 CLINICAL DATA:  Infection of AV graft. EXAM: ULTRASOUND LEFT UPPER EXTREMITY LIMITED TECHNIQUE: Ultrasound examination of the upper extremity soft tissues was performed in the area of clinical concern. COMPARISON:  None FINDINGS: Ultrasound performed in the left forearm in the area of concern. There is a large heterogeneous hypoechoic mass noted surrounding the left forearm AV graft. This measures 4.0 x 3.9 x 3.6 cm. Burtis Junes this representing a large hematoma. Infection cannot be excluded. IMPRESSION: Large hypoechoic area surrounding the left forearm AV graft, favor hematoma although infection cannot be excluded. Electronically Signed   By: Rolm Baptise M.D.   On: 08/06/2018 02:54    Subjective: Eager to go home  Discharge Exam: Vitals:   08/11/18 1220 08/11/18 1637  BP: 106/67 107/62  Pulse: 88 86  Resp: 16 20  Temp: 98.6 F (37 C) 99 F (37.2 C)  SpO2:  98%   Vitals:   08/11/18 1130 08/11/18 1200 08/11/18 1220 08/11/18 1637  BP: (!) 104/54 103/63 106/67 107/62  Pulse: 84 88 88 86  Resp:   16 20  Temp:   98.6 F (37 C) 99 F (37.2 C)  TempSrc:   Oral Oral  SpO2:    98%  Weight:   59.1 kg   Height:        General: Pt is alert, awake, not in acute distress Cardiovascular: RRR, S1/S2 +, no rubs, no gallops Respiratory: CTA bilaterally, no wheezing, no rhonchi Abdominal: Soft, NT, ND, bowel sounds + Extremities: no edema, no cyanosis   The results of  significant diagnostics from this hospitalization (including imaging, microbiology, ancillary and laboratory) are listed below for reference.     Microbiology: Recent Results (from the past 240 hour(s))  Blood culture (routine x 2)     Status: None   Collection Time: 08/05/18  9:22 PM  Result Value Ref Range Status   Specimen Description BLOOD RIGHT HAND  Final   Special Requests   Final    BOTTLES DRAWN AEROBIC ONLY Blood Culture adequate volume   Culture   Final    NO GROWTH 5 DAYS Performed at [facility], [address], [place], [place] [address]    Report Status 08/10/2018 FINAL  Final  Blood culture (routine x 2)     Status: None   Collection Time: 08/05/18 11:30 PM  Result Value Ref Range Status   Specimen Description SITE NOT SPECIFIED  Final   Special Requests   Final    BOTTLES DRAWN AEROBIC AND ANAEROBIC Blood Culture adequate volume   Culture  Final    NO GROWTH 5 DAYS Performed at [facility], [place] [address], [place], [place] [address]    Report Status 08/11/2018 FINAL  Final  Aerobic/Anaerobic Culture (surgical/deep wound)     Status: None (Preliminary result)   Collection Time: 08/06/18  5:04 PM  Result Value Ref Range Status   Specimen Description WOUND LEFT ARM GRAFT  Final   Special Requests VANCOMYCON AND MAXIPINE  Final   Gram Stain   Final    FEW WBC PRESENT, PREDOMINANTLY PMN RARE GRAM POSITIVE COCCI    Culture   Final    NO GROWTH 5 DAYS NO ANAEROBES ISOLATED; CULTURE IN PROGRESS FOR 5 DAYS Performed at [facility], [place] [address], [place], [place] [address]    Report Status PENDING  Incomplete  Surgical pcr screen     Status: None   Collection Time: 08/07/18 10:50 PM  Result Value Ref Range Status   MRSA, PCR NEGATIVE NEGATIVE Final   Staphylococcus aureus NEGATIVE NEGATIVE Final    Comment: (NOTE) The Xpert SA Assay (FDA approved for NASAL specimens in patients 93 years of age and older), is one component of a  comprehensive surveillance program. It is not intended to diagnose infection nor to guide or monitor treatment. Performed at [facility], [place] [address] [place], [place] [address]      Labs: BNP (last 3 results) No results for input(s): BNP in the last 8760 hours. Basic Metabolic Panel: Recent Labs  Lab 08/05/18 1949 08/05/18 2348 08/06/18 0503 08/06/18 1435 08/07/18 0735 08/08/18 1218 08/11/18 0858  NA 132*  --  133* 138 134* 136 132*  K 3.2*  --  3.6 3.8 4.3 3.8 5.2*  CL 94*  --  97*  --  100 103 95*  CO2 26  --  25  --  19* 21* 26  GLUCOSE 320*  --  225* 95 160* 161* 108*  BUN 31*  --  33*  --  39* 44* 43*  CREATININE 9.33*  --  9.81*  --  11.41* 12.48* 10.88*  CALCIUM 7.2*  --  7.1*  --  7.2* 6.8* 7.9*  MG  --  2.0  --   --   --   --   --   PHOS  --   --  3.8  --  4.9* 5.1* 5.3*   Liver Function Tests: Recent Labs  Lab 08/05/18 1949 08/06/18 0503 08/07/18 0735 08/08/18 1218 08/11/18 0858  AST 33  --   --   --   --   ALT <5  --   --   --   --   ALKPHOS 69  --   --   --   --   BILITOT 0.4  --   --   --   --   PROT 6.6  --   --   --   --   ALBUMIN 2.4* 2.3* 2.2* 2.2* 2.4*   No results for input(s): LIPASE, AMYLASE in the last 168 hours. No results for input(s): AMMONIA in the last 168 hours. CBC: Recent Labs  Lab 08/05/18 1949 08/06/18 0503 08/06/18 1435 08/08/18 1215 08/09/18 1101 08/11/18 0857  WBC 7.5 6.3  --  5.9 6.1 6.8  NEUTROABS 5.4  --   --   --   --   --   HGB 6.8* 6.3* 8.8* 7.5* 7.6* 7.6*  HCT 20.8* 19.5* 26.0* 23.3* 24.3* 23.8*  MCV 105.1* 103.7*  --  104.5* 104.7* 102.6*  PLT 165 155  --  176 196 221   Cardiac Enzymes: No results for input(s): CKTOTAL, CKMB, CKMBINDEX, TROPONINI in the last 168 hours. BNP: Invalid input(s): POCBNP CBG: Recent Labs  Lab 08/10/18 2233 08/11/18 0514 08/11/18 0738 08/11/18 1240 08/11/18 1601  GLUCAP 109* 83 109* 217* 178*   D-Dimer No results for input(s): DDIMER in the last 72  hours. Hgb A1c No results for input(s): HGBA1C in the last 72 hours. Lipid Profile No results for input(s): CHOL, HDL, LDLCALC, TRIG, CHOLHDL, LDLDIRECT in the last 72 hours. Thyroid function studies No results for input(s): TSH, T4TOTAL, T3FREE, THYROIDAB in the last 72 hours.  Invalid input(s): FREET3 Anemia work up No results for input(s): VITAMINB12, FOLATE, FERRITIN, TIBC, IRON, RETICCTPCT in the last 72 hours. Urinalysis    Component Value Date/Time   COLORURINE YELLOW 10/07/2011 1618   APPEARANCEUR CLEAR 10/07/2011 1618   LABSPEC 1.016 10/07/2011 1618   PHURINE 5.0 10/07/2011 1618   GLUCOSEU 500 (A) 10/07/2011 1618   HGBUR MODERATE (A) 10/07/2011 1618   BILIRUBINUR NEGATIVE 10/07/2011 1618   KETONESUR NEGATIVE 10/07/2011 1618   PROTEINUR >300 (A) 10/07/2011 1618   UROBILINOGEN 0.2 10/07/2011 1618   NITRITE NEGATIVE 10/07/2011 1618   LEUKOCYTESUR NEGATIVE 10/07/2011 1618   Sepsis Labs Invalid input(s): PROCALCITONIN,  WBC,  LACTICIDVEN Microbiology Recent Results (from the past 240 hour(s))  Blood culture (routine x 2)     Status: None   Collection Time: 08/05/18  9:22 PM  Result Value Ref Range Status   Specimen Description BLOOD RIGHT HAND  Final   Special Requests   Final    BOTTLES DRAWN AEROBIC ONLY Blood Culture adequate volume   Culture   Final    NO GROWTH 5 DAYS Performed at [facility], [place] [address] [place], [place] [address]    Report Status 08/10/2018 FINAL  Final  Blood culture (routine x 2)     Status: None   Collection Time: 08/05/18 11:30 PM  Result Value Ref Range Status   Specimen Description SITE NOT SPECIFIED  Final   Special Requests   Final    BOTTLES DRAWN AEROBIC AND ANAEROBIC Blood Culture adequate volume   Culture   Final    NO GROWTH 5 DAYS Performed at [facility], [address], [place], [place] [address]    Report Status 08/11/2018 FINAL  Final  Aerobic/Anaerobic Culture (surgical/deep wound)     Status: None  (Preliminary result)   Collection Time: 08/06/18  5:04 PM  Result Value Ref Range Status   Specimen Description WOUND LEFT ARM GRAFT  Final   Special Requests VANCOMYCON AND MAXIPINE  Final   Gram Stain   Final    FEW WBC PRESENT, PREDOMINANTLY PMN RARE GRAM POSITIVE COCCI    Culture   Final    NO GROWTH 5 DAYS NO ANAEROBES ISOLATED; CULTURE IN PROGRESS FOR 5 DAYS Performed at [facility], [place] [address] [place],  [address]    Report Status PENDING  Incomplete  Surgical pcr screen     Status: None   Collection Time: 08/07/18 10:50 PM  Result Value Ref Range Status   MRSA, PCR NEGATIVE NEGATIVE Final   Staphylococcus aureus NEGATIVE NEGATIVE Final    Comment: (NOTE) The Xpert SA Assay (FDA approved for NASAL specimens in patients 68 years of age and older), is one component of a comprehensive surveillance program. It is not intended to diagnose infection nor to  guide or monitor treatment. Performed at [facility], [place] [address], [place], [place] [address]    Time spent: 30 min  SIGNED:   Marylu Lund, MD  [facility] 08/11/2018, 5:41 PM  If 7PM-7AM, please contact night-coverage

## 2018-08-11 NOTE — Progress Notes (Signed)
Inpatient Diabetes Program Recommendations  AACE/ADA: New Consensus Statement on Inpatient Glycemic Control (2015)  Target Ranges:  Prepandial:   less than 140 mg/dL      Peak postprandial:   less than 180 mg/dL (1-2 hours)      Critically ill patients:  140 - 180 mg/dL   Lab Results  Component Value Date   GLUCAP 109 (H) 08/11/2018   HGBA1C 8.4 (H) 08/06/2018    Review of Glycemic Control Results for Roger Vazquez, Roger Vazquez (MRN [id number]) as of 08/11/2018 11:26  Ref. Range 08/10/2018 21:10 08/10/2018 21:57 08/10/2018 22:33 08/11/2018 05:14 08/11/2018 07:38  Glucose-Capillary Latest Ref Range: 70 - 99 mg/dL 46 (L) 54 (L) 109 (H) 83 109 (H)   Diabetes history: Type 2 DM Outpatient Diabetes medications: Novolog 0-9 units TID, Lantus 15 units QHS Current orders for Inpatient glycemic control: Novolog 0-9 units TID, Lantus 15 units QD  Inpatient Diabetes Program Recommendations:    Given hypoglycemia on 10/27 & 10/26, consider reducing Lantus to 8 units QHS.   Thanks, Bronson Curb, MSN, RNC-OB Diabetes Coordinator [phone] (8a-5p)

## 2018-08-11 NOTE — Progress Notes (Signed)
  Progress Note    08/11/2018 5:25 PM 3 Days Post-Op  Subjective:  No complaints; wants to go home    Vitals:   08/11/18 1220 08/11/18 1637  BP: 106/67 107/62  Pulse: 88 86  Resp: 16 20  Temp: 98.6 F (37 C) 99 F (37.2 C)  SpO2:  98%    Physical Exam: Penrose removed; no purulence; ethilon sutures in tact upper incision.  CBC    Component Value Date/Time   WBC 6.8 08/11/2018 0857   RBC 2.32 (L) 08/11/2018 0857   HGB 7.6 (L) 08/11/2018 0857   HCT 23.8 (L) 08/11/2018 0857   PLT 221 08/11/2018 0857   MCV 102.6 (H) 08/11/2018 0857   MCH 32.8 08/11/2018 0857   MCHC 31.9 08/11/2018 0857   RDW 14.3 08/11/2018 0857   LYMPHSABS 1.2 08/05/2018 1949   MONOABS 0.8 08/05/2018 1949   EOSABS 0.0 08/05/2018 1949   BASOSABS 0.0 08/05/2018 1949    BMET    Component Value Date/Time   NA 132 (L) 08/11/2018 0858   K 5.2 (H) 08/11/2018 0858   CL 95 (L) 08/11/2018 0858   CO2 26 08/11/2018 0858   GLUCOSE 108 (H) 08/11/2018 0858   BUN 43 (H) 08/11/2018 0858   CREATININE 10.88 (H) 08/11/2018 0858   CALCIUM 7.9 (L) 08/11/2018 0858   CALCIUM 7.1 (L) 08/01/2018 0944   GFRNONAA 4 (L) 08/11/2018 0858   GFRAA 5 (L) 08/11/2018 0858    INR    Component Value Date/Time   INR 1.08 07/31/2018 0656     Intake/Output Summary (Last 24 hours) at 08/11/2018 1725 Last data filed at 08/11/2018 1500 Gross per 24 hour  Intake 840 ml  Output 3000 ml  Net -2160 ml     Assessment:  67 y.o. male is s/p:  removal of infected L arm loop graft with subsequent placement of R IJ TDC  3 Days Post-Op  Plan: -penrose removed-will need wet to dry dressing daily to wound on left arm followed by kerlix and ace wrap -f/u in our office 11/11--appointment has been made    Leontine Locket, PA-C [facility] [phone] 08/11/2018 5:25 PM

## 2018-08-11 NOTE — Discharge Instructions (Signed)
Wet to dry dressing changes daily

## 2018-08-12 LAB — AEROBIC/ANAEROBIC CULTURE (SURGICAL/DEEP WOUND): CULTURE: NO GROWTH

## 2018-08-12 LAB — AEROBIC/ANAEROBIC CULTURE W GRAM STAIN (SURGICAL/DEEP WOUND)

## 2018-08-15 ENCOUNTER — Emergency Department (HOSPITAL_COMMUNITY)
Admission: EM | Admit: 2018-08-15 | Discharge: 2018-08-15 | Disposition: A | Discharge status: Home / Self Care | Payer: Medicare PPO | Attending: Emergency Medicine | Admitting: Emergency Medicine

## 2018-08-15 ENCOUNTER — Encounter (HOSPITAL_COMMUNITY): Payer: Self-pay | Admitting: Emergency Medicine

## 2018-08-15 DIAGNOSIS — Z0489 Encounter for examination and observation for other specified reasons: Secondary | ICD-10-CM | POA: Insufficient documentation

## 2018-08-15 DIAGNOSIS — Z5321 Procedure and treatment not carried out due to patient leaving prior to being seen by health care provider: Secondary | ICD-10-CM | POA: Insufficient documentation

## 2018-08-15 NOTE — ED Triage Notes (Signed)
Pt to ER for evaluation of possibly infected previous graft site to left upper arm. Has no missed any dialysis treatments, has HD cath present to right chest. States had it removed due to infection. States "I need it cleaned out before it gets infected."

## 2018-08-15 NOTE — ED Notes (Signed)
Pt refusing blood work 

## 2018-08-18 ENCOUNTER — Encounter: Payer: Self-pay | Admitting: Family

## 2018-08-18 ENCOUNTER — Ambulatory Visit (INDEPENDENT_AMBULATORY_CARE_PROVIDER_SITE_OTHER): Payer: Medicare PPO | Admitting: Family

## 2018-08-18 VITALS — BP 145/83 | HR 90 | Temp 97.8°F | Resp 16 | Ht 66.0 in | Wt 136.0 lb

## 2018-08-18 DIAGNOSIS — T827XXA Infection and inflammatory reaction due to other cardiac and vascular devices, implants and grafts, initial encounter: Secondary | ICD-10-CM

## 2018-08-18 DIAGNOSIS — Z4889 Encounter for other specified surgical aftercare: Secondary | ICD-10-CM

## 2018-08-18 DIAGNOSIS — Z9889 Other specified postprocedural states: Secondary | ICD-10-CM

## 2018-08-18 NOTE — Progress Notes (Signed)
CC: pt asking for dressing change left upper arm wound and evaluation of swelling in left forearm and hand that started last night, s/p removal of infected loop graft left upper arm on 09-06-18  History of Present Illness  Roger Vazquez is a 67 y.o. (09/08/1951) male who is s/p removal of an infected left upper arm AV loop graft on 08-06-18 by Dr. Donnetta Hutching. On 08-08-18 Dr. Carlis Abbott inserted a right internal jugular vein tunneled dialysis catheter.   Pt returns today with c/o swelling in his left forearm that started last night, he does not know if he slept on his left side.  He also states he wants his bandage changed on his left upper arm. He states HH is supposed to call him today between 2-3 pm.  He denies fever or chills, denies pain.  He states he is receiving antibiotics in HD.  Pt has a follow up scheduled on 08-25-18 with a PA for wound check.   He presented to the ED on 08-15-18 to change the bandage on his left upper arm incision, he left prematurely it seems, reviewing the ED note.   He dialyzes MWF in [place].    Past Medical History:  Diagnosis Date  . Diabetes mellitus   . Hypertension   . Renal disorder     Social History Social History   Tobacco Use  . Smoking status: Former Smoker    Packs/day: 0.50  . Smokeless tobacco: Former Systems developer    Quit date: 08/01/2015  Substance Use Topics  . Alcohol use: Yes    Alcohol/week: 9.0 standard drinks    Types: 9 Cans of beer per week  . Drug use: Yes    Types: Marijuana    Family History No family history on file.  Surgical History Past Surgical History:  Procedure Laterality Date  . [place] REMOVAL Left 08/06/2018   Procedure: REMOVAL OF LEFT ARM GRAFT;  Surgeon: Rosetta Posner, MD;  Location: [place];  Service: Vascular;  Laterality: Left;  . INSERTION OF DIALYSIS CATHETER Right 08/08/2018   Procedure: INSERTION OF TUNNELED  DIALYSIS CATHETER;  Surgeon: Marty Heck, MD;  Location: [place];  Service: Vascular;   Laterality: Right;  . TEE WITHOUT CARDIOVERSION N/A 07/31/2018   Procedure: TRANSESOPHAGEAL ECHOCARDIOGRAM (TEE);  Surgeon: Pixie Casino, MD;  Location: [facility] ENDOSCOPY;  Service: Cardiovascular;  Laterality: N/A;    No Known Allergies  Current Outpatient Medications  Medication Sig Dispense Refill  . amLODipine (NORVASC) 10 MG tablet Take 5 mg by mouth daily.      Marland Kitchen aspirin 81 MG tablet Take 81 mg by mouth daily.      . blood glucose meter kit and supplies KIT Dispense based on patient and insurance preference. Use up to four times daily as directed. (FOR ICD-9 250.00, 250.01). For QAC - HS accuchecks. 1 each 1  . ceFAZolin (ANCEF) 2-4 GM/100ML-% IVPB Inject 100 mLs (2 g total) into the vein every Monday, Wednesday, and Friday with hemodialysis. 1 each   . insulin aspart (NOVOLOG) 100 UNIT/ML injection Before each meal 3 times a day, 140-199 - 2 units, 200-250 - 4 units, 251-299 - 6 units,  300-349 - 8 units,  350 or above 10 units. Dispense syringes and needles as needed, Ok to switch to PEN if approved. Substitute to any brand approved. DX DM2, Code E11.65 1 vial 0  . insulin glargine (LANTUS) 100 UNIT/ML injection Inject 0.15 mLs (15 Units total) into the skin daily. Dispense  insulin pen if approved, if not dispense as needed syringes and needles for 1 month supply. Can switch to Levemir. Diagnosis E 11.65. 10 mL 0  . Insulin Syringe-Needle U-100 25G X 1" 1 ML MISC For 4 times a day insulin SQ, 1 month supply. Diagnosis E11.65 30 each 0  . Multiple Vitamin (MULITIVITAMIN WITH MINERALS) TABS Take 1 tablet by mouth daily. 30 tablet 0  . hydrALAZINE (APRESOLINE) 25 MG tablet Take 2 tablets (50 mg total) by mouth every 8 (eight) hours. 90 tablet 3  . pantoprazole (PROTONIX) 40 MG tablet Take 1 tablet (40 mg total) by mouth 2 (two) times daily. 60 tablet 3   No current facility-administered medications for this visit.      REVIEW OF SYSTEMS: see HPI for pertinent positives and negatives     PHYSICAL EXAMINATION:  Vitals:   08/18/18 1222  BP: (!) 145/83  Pulse: 90  Resp: 16  Temp: 97.8 F (36.6 C)  SpO2: 100%  Weight: 136 lb (61.7 kg)  Height: _0  (1.676 m)   Body mass index is 21.95 kg/m.  General: The patient appears his stated age.   HEENT:  No gross abnormalities Pulmonary: Respirations are non-labored, CTAB. Abdomen: Soft and non-tender Musculoskeletal: There are no major deformities.  1-2+ non pitting edema in left forearm and hand.  Neurologic: No focal weakness or paresthesias are detected Skin: There are no rashes noted. See photo below.  Psychiatric: The patient has normal affect. Cardiovascular: There is a regular rate and rhythm without significant murmur appreciated.  Bilateral radial pulses are 2+ palpable. Equal sensation in both hands.  TDC at right upper chest.     Left upper arm shallow wound 3 cm in length, 1 cm at widest width. No erythema, no purulence.     Medical Decision Making  Roger Vazquez is a 67 y.o. male who  is s/p removal of an infected left upper arm AV loop graft on 08-06-18 by Dr. Donnetta Hutching. On 08-08-18 Dr. Carlis Abbott inserted a right internal jugular vein tunneled dialysis catheter.    Shallow open wound at left upper arm graft removal site, no signs of infection.  Pt states HH is to call him today between 2-3. He will need daily NS damp to dry gauze dressing changes to the open wound. He receives antibiotics during hemodialysis.  Left forearm and hand swelling since last night: it appears that pt laid on his left arm last night. I advised pt and his sister present that pt should elevated his left arm on a pillow at night, so that his hand is above his heart; during the day try to elevate hand above his heart by flexing his left elbow, hand above heart, left arm resting on his chest.   Follow up as already scheduled on 08-25-18 in PA clinic for wound check.    Clemon Chambers, RN, MSN, FNP-C [facility] [place] Office: [phone]  08/18/2018, 12:47 PM  Clinic MD: Trula Slade

## 2018-08-20 DIAGNOSIS — E441 Mild protein-calorie malnutrition: Secondary | ICD-10-CM | POA: Insufficient documentation

## 2018-08-25 ENCOUNTER — Other Ambulatory Visit: Payer: Self-pay

## 2018-08-25 ENCOUNTER — Ambulatory Visit (INDEPENDENT_AMBULATORY_CARE_PROVIDER_SITE_OTHER): Payer: Self-pay | Admitting: Physician Assistant

## 2018-08-25 VITALS — BP 117/68 | HR 82 | Temp 98.1°F | Resp 16 | Ht 66.0 in | Wt 130.0 lb

## 2018-08-25 DIAGNOSIS — Z48812 Encounter for surgical aftercare following surgery on the circulatory system: Secondary | ICD-10-CM

## 2018-08-25 DIAGNOSIS — T827XXA Infection and inflammatory reaction due to other cardiac and vascular devices, implants and grafts, initial encounter: Secondary | ICD-10-CM

## 2018-08-25 NOTE — Progress Notes (Signed)
  POST OPERATIVE OFFICE NOTE    CC:  F/u for surgery  HPI:  This is a 67 y.o. male who is s/p removal of left UE AV graft that was placed at the [facility].    He was admitted with methicillin-resistant staph aureus bacteremia.  With evidence of obvious infection in his left upper arm AV graft.  She is here today for wound check.  He is currently on HD via [facility].    No Known Allergies  Current Outpatient Medications  Medication Sig Dispense Refill  . amLODipine (NORVASC) 10 MG tablet Take 5 mg by mouth daily.      Marland Kitchen aspirin 81 MG tablet Take 81 mg by mouth daily.      . blood glucose meter kit and supplies KIT Dispense based on patient and insurance preference. Use up to four times daily as directed. (FOR ICD-9 250.00, 250.01). For QAC - HS accuchecks. 1 each 1  . ceFAZolin (ANCEF) 2-4 GM/100ML-% IVPB Inject 100 mLs (2 g total) into the vein every Monday, Wednesday, and Friday with hemodialysis. 1 each   . insulin aspart (NOVOLOG) 100 UNIT/ML injection Before each meal 3 times a day, 140-199 - 2 units, 200-250 - 4 units, 251-299 - 6 units,  300-349 - 8 units,  350 or above 10 units. Dispense syringes and needles as needed, Ok to switch to PEN if approved. Substitute to any brand approved. DX DM2, Code E11.65 1 vial 0  . insulin glargine (LANTUS) 100 UNIT/ML injection Inject 0.15 mLs (15 Units total) into the skin daily. Dispense insulin pen if approved, if not dispense as needed syringes and needles for 1 month supply. Can switch to Levemir. Diagnosis E 11.65. 10 mL 0  . Insulin Syringe-Needle U-100 25G X 1" 1 ML MISC For 4 times a day insulin SQ, 1 month supply. Diagnosis E11.65 30 each 0  . Multiple Vitamin (MULITIVITAMIN WITH MINERALS) TABS Take 1 tablet by mouth daily. 30 tablet 0  . hydrALAZINE (APRESOLINE) 25 MG tablet Take 2 tablets (50 mg total) by mouth every 8 (eight) hours. 90 tablet 3  . pantoprazole (PROTONIX) 40 MG tablet Take 1 tablet (40 mg total) by mouth 2 (two) times daily. 60  tablet 3   No current facility-administered medications for this visit.      ROS:  See HPI  Physical Exam:  Vitals:   08/25/18 1446  BP: 117/68  Pulse: 82  Resp: 16  Temp: 98.1 F (36.7 C)  SpO2: 99%    Incision:  Well and completely healed.  Nylon sutures were removed today.  Patient tolerated this well. Extremities:  Palpable radial pulse, sensation intact, grip 5/5.   Assessment/Plan:  This is a 67 y.o. male who is s/p:Removal of left upper arm loop graft  Well healed left UE incisions. He is still on IV antibiotics after HD f/u by Infectious disease.  He wants to f/u with the [facility] for new HD access.  If he changes his mind he will call us.  We will need right UE vein mapping once he has completed his IV antibiotics.  Continue cefazolin with dialysis through 09/18/18.   [name] PA-C [facility] [phone]

## 2018-09-15 ENCOUNTER — Ambulatory Visit (INDEPENDENT_AMBULATORY_CARE_PROVIDER_SITE_OTHER): Payer: Medicare PPO | Admitting: Family

## 2018-09-15 ENCOUNTER — Encounter: Payer: Self-pay | Admitting: Family

## 2018-09-15 ENCOUNTER — Inpatient Hospital Stay: Payer: Medicare PPO | Admitting: Family

## 2018-09-15 VITALS — Ht 66.0 in | Wt 134.0 lb

## 2018-09-15 DIAGNOSIS — R7881 Bacteremia: Secondary | ICD-10-CM | POA: Diagnosis not present

## 2018-09-15 DIAGNOSIS — T827XXA Infection and inflammatory reaction due to other cardiac and vascular devices, implants and grafts, initial encounter: Secondary | ICD-10-CM

## 2018-09-15 DIAGNOSIS — B9561 Methicillin susceptible Staphylococcus aureus infection as the cause of diseases classified elsewhere: Secondary | ICD-10-CM

## 2018-09-15 NOTE — Patient Instructions (Signed)
Nice to see you.  We will check your blood work today.  No additional antibiotics are needed at this time.  Follow up with Vascular Surgeons to determine next steps for dialysis access.   Follow up with ID as needed.

## 2018-09-15 NOTE — Progress Notes (Signed)
Subjective:    Patient ID: Roger Vazquez, male    DOB: 1951-06-09, 67 y.o.   MRN: [id number]  Chief Complaint  Patient presents with  . MSSA AV Fistula Graft Infection    HPI:  Roger Vazquez is a 67 y.o. male who presents today for initial office visit following hospitalization.   Mr. Derossett presented to the hospital with swelling of his left arm at the site of his AV fistula with fevers as high as 102 at home in the setting of recent discharge for MSSA bacteremia of unclear source. He underwent excision of the infected left upper extremity AV graft with gross puss encountered during the procedure. Gram stain were positive for gram positive cocci with cultures showing no growth in the setting of receiving Cefazolin for previously diagnosed bacteremia. TEE on 10/17 during initial hospitalization was negative for vegetations. Blood cultures were without growth. He was continued on his Cefazolin for an additional 6 weeks from surgery with dialysis with end date scheduled for 09/18/18. All hospital labs, records, and imaging were reviewed in detail.   Mr. Kotarski has been receiving his Cefazolin with dialysis but notes today did not receive it. He has been doing well since leaving the hospital with good healing of his previous vascular site located in his left upper arm. He has had no fevers, chills, sweats or drainage.    No Known Allergies    Outpatient Medications Prior to Visit  Medication Sig Dispense Refill  . amLODipine (NORVASC) 10 MG tablet Take 5 mg by mouth daily.      Marland Kitchen aspirin 81 MG tablet Take 81 mg by mouth daily.      . blood glucose meter kit and supplies KIT Dispense based on patient and insurance preference. Use up to four times daily as directed. (FOR ICD-9 250.00, 250.01). For QAC - HS accuchecks. 1 each 1  . insulin aspart (NOVOLOG) 100 UNIT/ML injection Before each meal 3 times a day, 140-199 - 2 units, 200-250 - 4 units, 251-299 - 6 units,  300-349 - 8 units,   350 or above 10 units. Dispense syringes and needles as needed, Ok to switch to PEN if approved. Substitute to any brand approved. DX DM2, Code E11.65 1 vial 0  . insulin glargine (LANTUS) 100 UNIT/ML injection Inject 0.15 mLs (15 Units total) into the skin daily. Dispense insulin pen if approved, if not dispense as needed syringes and needles for 1 month supply. Can switch to Levemir. Diagnosis E 11.65. 10 mL 0  . Insulin Syringe-Needle U-100 25G X 1" 1 ML MISC For 4 times a day insulin SQ, 1 month supply. Diagnosis E11.65 30 each 0  . Multiple Vitamin (MULITIVITAMIN WITH MINERALS) TABS Take 1 tablet by mouth daily. 30 tablet 0  . ceFAZolin (ANCEF) 2-4 GM/100ML-% IVPB Inject 100 mLs (2 g total) into the vein every Monday, Wednesday, and Friday with hemodialysis. (Patient not taking: Reported on 09/15/2018) 1 each   . hydrALAZINE (APRESOLINE) 25 MG tablet Take 2 tablets (50 mg total) by mouth every 8 (eight) hours. 90 tablet 3  . pantoprazole (PROTONIX) 40 MG tablet Take 1 tablet (40 mg total) by mouth 2 (two) times daily. 60 tablet 3   No facility-administered medications prior to visit.      Past Medical History:  Diagnosis Date  . Diabetes mellitus   . Hypertension   . Renal disorder      Past Surgical History:  Procedure Laterality Date  . AVGG REMOVAL  Left 08/06/2018   Procedure: REMOVAL OF LEFT ARM GRAFT;  Surgeon: Rosetta Posner, MD;  Location: [place];  Service: Vascular;  Laterality: Left;  . INSERTION OF DIALYSIS CATHETER Right 08/08/2018   Procedure: INSERTION OF TUNNELED  DIALYSIS CATHETER;  Surgeon: Marty Heck, MD;  Location: [place];  Service: Vascular;  Laterality: Right;  . TEE WITHOUT CARDIOVERSION N/A 07/31/2018   Procedure: TRANSESOPHAGEAL ECHOCARDIOGRAM (TEE);  Surgeon: Pixie Casino, MD;  Location: [facility] ENDOSCOPY;  Service: Cardiovascular;  Laterality: N/A;      History reviewed. No pertinent family history.    Social History   Socioeconomic History  .  Marital status: Divorced    Spouse name: Not on file  . Number of children: Not on file  . Years of education: Not on file  . Highest education level: Not on file  Occupational History  . Not on file  Social Needs  . Financial resource strain: Not on file  . Food insecurity:    Worry: Not on file    Inability: Not on file  . Transportation needs:    Medical: Not on file    Non-medical: Not on file  Tobacco Use  . Smoking status: Former Smoker    Packs/day: 0.50  . Smokeless tobacco: Former Systems developer    Quit date: 08/01/2015  Substance and Sexual Activity  . Alcohol use: Yes    Alcohol/week: 9.0 standard drinks    Types: 9 Cans of beer per week  . Drug use: Yes    Types: Marijuana  . Sexual activity: Not on file    Comment: Pt uses vicodin  Lifestyle  . Physical activity:    Days per week: Not on file    Minutes per session: Not on file  . Stress: Not on file  Relationships  . Social connections:    Talks on phone: Not on file    Gets together: Not on file    Attends religious service: Not on file    Active member of club or organization: Not on file    Attends meetings of clubs or organizations: Not on file    Relationship status: Not on file  . Intimate partner violence:    Fear of current or ex partner: Not on file    Emotionally abused: Not on file    Physically abused: Not on file    Forced sexual activity: Not on file  Other Topics Concern  . Not on file  Social History Narrative  . Not on file      Review of Systems  Constitutional: Negative for appetite change, chills and diaphoresis.  Respiratory: Negative for chest tightness, shortness of breath and wheezing.   Cardiovascular: Negative for chest pain, palpitations and leg swelling.  Skin: Negative for color change, rash and wound.       Objective:    Ht 5' 6"  (1.676 m)   Wt 134 lb (60.8 kg)   BMI 21.63 kg/m  Nursing note and vital signs reviewed.  Physical Exam  Constitutional: He is oriented  to person, place, and time. He appears well-developed and well-nourished. No distress.  Cardiovascular: Normal rate, regular rhythm, normal heart sounds and intact distal pulses.  Temporary dialysis catheter in right upper chest appears with dressing that is clean and dry. No induration or areas of tenderness.   Pulmonary/Chest: Effort normal and breath sounds normal.  Neurological: He is alert and oriented to person, place, and time.  Skin: Skin is warm and dry.  Left  upper arm surgical site appears well approximated, clean and dry. There is mild warmth compared to the contralateral side with no drainage, induration or pain. There is no overt evidence of infection.   Psychiatric: He has a normal mood and affect. His behavior is normal. Judgment and thought content normal.        Assessment & Plan:   Problem List Items Addressed This Visit      Cardiovascular and Mediastinum   Infection of AV graft for dialysis [facility])    Surgical site appears clean and dry with no clear evidence of infection. He has completed 6 weeks of Cefazolin with dialysis and is not currently having any symptoms. Continues to have temporary dialysis catheter located in his right upper chest that is also clean and dry. No further treatment appears necessary at this time.       Relevant Orders   Culture, blood (single)   Culture, blood (single)     Other   Bacteremia due to methicillin susceptible Staphylococcus aureus (MSSA) - Primary    Mr. Dykstra has completed 6 weeks of Cefazolin with dialsysis for MSSA bacteremia with the likely source being an infected AV fistula which has been removed. He has had no further symptoms and the wound appears to be healing without evidence of infection. Will check blood cultures today. Follow up as needed pending blood work results.       Relevant Orders   Culture, blood (single)   Culture, blood (single)       I am having Ruford L. Chokshi maintain his aspirin, amLODipine,  hydrALAZINE, multivitamin with minerals, pantoprazole, insulin aspart, blood glucose meter kit and supplies, Insulin Syringe-Needle U-100, insulin glargine, and ceFAZolin.   Follow-up: Return if symptoms worsen or fail to improve.    Terri Piedra, MSN, FNP-C Nurse Practitioner [facility] Office phone: [phone] Pager: [place] number: [phone]

## 2018-09-15 NOTE — Assessment & Plan Note (Signed)
Mr. Serfass has completed 6 weeks of Cefazolin with dialsysis for MSSA bacteremia with the likely source being an infected AV fistula which has been removed. He has had no further symptoms and the wound appears to be healing without evidence of infection. Will check blood cultures today. Follow up as needed pending blood work results.

## 2018-09-15 NOTE — Assessment & Plan Note (Signed)
Surgical site appears clean and dry with no clear evidence of infection. He has completed 6 weeks of Cefazolin with dialysis and is not currently having any symptoms. Continues to have temporary dialysis catheter located in his right upper chest that is also clean and dry. No further treatment appears necessary at this time.

## 2018-09-16 ENCOUNTER — Other Ambulatory Visit: Payer: Self-pay | Admitting: *Deleted

## 2018-09-16 DIAGNOSIS — R7881 Bacteremia: Secondary | ICD-10-CM

## 2018-09-16 DIAGNOSIS — T827XXD Infection and inflammatory reaction due to other cardiac and vascular devices, implants and grafts, subsequent encounter: Secondary | ICD-10-CM

## 2018-09-18 ENCOUNTER — Other Ambulatory Visit: Payer: Medicare PPO

## 2018-11-02 NOTE — Progress Notes (Deleted)
HISTORY AND PHYSICAL     CC:  dialysis access Requesting Provider:  Clinic, [facility]  HPI: This is a 68 y.o. male here for evaluation for hemodialysis access.  He underwent removal of infected LUA AV loop graft on 08/06/18 by Dr. Donnetta Hutching.  He had a tunneled dialysis catheter placed by Dr. Carlis Abbott on 08/08/18.  He was seen back in November 2019 with c/o of swelling in his left forearm.   At that time he had a shallow open wound at the [place] at the graft removal site but no signs of infection.  He had left forearm and hand swelling that had started the night before and it was felt he had slept on his arm.  He was advised to elevate his arm and hand above his heart.   He followed up a week later and his incisions had healed.  He was seen by ID in December 2019 and his surgical site was clean and dry and he had completed 6  Weeks of IV abx with HD.    He presents today for new HD access.      The pt is *** hand dominant.   Pt *** on dialysis.    If pt on Dialysis:   Dialysis days/center:  ***  Past Medical History:  Diagnosis Date  . Diabetes mellitus   . Hypertension   . Renal disorder     Past Surgical History:  Procedure Laterality Date  . [place] REMOVAL Left 08/06/2018   Procedure: REMOVAL OF LEFT ARM GRAFT;  Surgeon: Rosetta Posner, MD;  Location: [facility];  Service: Vascular;  Laterality: Left;  . INSERTION OF DIALYSIS CATHETER Right 08/08/2018   Procedure: INSERTION OF TUNNELED  DIALYSIS CATHETER;  Surgeon: Marty Heck, MD;  Location: [facility];  Service: Vascular;  Laterality: Right;  . TEE WITHOUT CARDIOVERSION N/A 07/31/2018   Procedure: TRANSESOPHAGEAL ECHOCARDIOGRAM (TEE);  Surgeon: Pixie Casino, MD;  Location[facility] ENDOSCOPY;  Service: Cardiovascular;  Laterality: N/A;    No Known Allergies  Current Outpatient Medications  Medication Sig Dispense Refill  . amLODipine (NORVASC) 10 MG tablet Take 5 mg by mouth daily.      Marland Kitchen aspirin 81 MG tablet Take 81 mg by mouth  daily.      . blood glucose meter kit and supplies KIT Dispense based on patient and insurance preference. Use up to four times daily as directed. (FOR ICD-9 250.00, 250.01). For QAC - HS accuchecks. 1 each 1  . hydrALAZINE (APRESOLINE) 25 MG tablet Take 2 tablets (50 mg total) by mouth every 8 (eight) hours. 90 tablet 3  . insulin aspart (NOVOLOG) 100 UNIT/ML injection Before each meal 3 times a day, 140-199 - 2 units, 200-250 - 4 units, 251-299 - 6 units,  300-349 - 8 units,  350 or above 10 units. Dispense syringes and needles as needed, Ok to switch to PEN if approved. Substitute to any brand approved. DX DM2, Code E11.65 1 vial 0  . insulin glargine (LANTUS) 100 UNIT/ML injection Inject 0.15 mLs (15 Units total) into the skin daily. Dispense insulin pen if approved, if not dispense as needed syringes and needles for 1 month supply. Can switch to Levemir. Diagnosis E 11.65. 10 mL 0  . Insulin Syringe-Needle U-100 25G X 1" 1 ML MISC For 4 times a day insulin SQ, 1 month supply. Diagnosis E11.65 30 each 0  . Multiple Vitamin (MULITIVITAMIN WITH MINERALS) TABS Take 1 tablet by mouth daily. 30 tablet 0  .  pantoprazole (PROTONIX) 40 MG tablet Take 1 tablet (40 mg total) by mouth 2 (two) times daily. 60 tablet 3   No current facility-administered medications for this visit.     No family history on file.  Social History   Socioeconomic History  . Marital status: Divorced    Spouse name: Not on file  . Number of children: Not on file  . Years of education: Not on file  . Highest education level: Not on file  Occupational History  . Not on file  Social Needs  . Financial resource strain: Not on file  . Food insecurity:    Worry: Not on file    Inability: Not on file  . Transportation needs:    Medical: Not on file    Non-medical: Not on file  Tobacco Use  . Smoking status: Former Smoker    Packs/day: 0.50  . Smokeless tobacco: Former Systems developer    Quit date: 08/01/2015  Substance and  Sexual Activity  . Alcohol use: Yes    Alcohol/week: 9.0 standard drinks    Types: 9 Cans of beer per week  . Drug use: Yes    Types: Marijuana  . Sexual activity: Not on file    Comment: Pt uses vicodin  Lifestyle  . Physical activity:    Days per week: Not on file    Minutes per session: Not on file  . Stress: Not on file  Relationships  . Social connections:    Talks on phone: Not on file    Gets together: Not on file    Attends religious service: Not on file    Active member of club or organization: Not on file    Attends meetings of clubs or organizations: Not on file    Relationship status: Not on file  . Intimate partner violence:    Fear of current or ex partner: Not on file    Emotionally abused: Not on file    Physically abused: Not on file    Forced sexual activity: Not on file  Other Topics Concern  . Not on file  Social History Narrative  . Not on file     ROS: _0  Positive   _1  Negative   _2  All sytems reviewed and are negative *** Cardiac: _3  chest pain/pressure _4  SOB _5  DOE  Vascular: _6  pain in legs while walking _7  pain in feet when lying flat _8  hx of DVT _9  swelling in legs  Pulmonary: _10  asthma _11  wheezing  Neurologic: _12  weakness in _13  arms _14  legs _15  numbness in _16  arms _17  legs _18 difficulty speaking or slurred speech _19  temporary loss of vision in one eye _20  dizziness  Hematologic: _21  bleeding problems  GI _22  GERD  GU: _23  CKD/renal failure  _24  HD---_25  M/W/F _26  T/T/F _27  burning with urination _28  blood in urine  Psychiatric: _29  hx of major depression  Integumentary: _30  rashes _31  ulcers  Constitutional: _32  fever _33  chills  PHYSICAL EXAMINATION:  ***  General:  WDWN male in NAD Gait: Not observed HENT: WNL Pulmonary: normal non-labored breathing , without Rales, rhonchi,  wheezing Cardiac: {Desc; regular/irreg:14544}, without  Murmurs {With/Without:20273} carotid bruit*** Abdomen: soft, NT, no masses Skin:  {With/Without:20273} rashes, {With/Without:20273} ulcers  Vascular Exam/Pulses:   Right Left  Radial {Exam; arterial pulse strength 0-4:30167} {Exam; arterial pulse strength 0-4:30167}  Ulnar {Exam; arterial pulse strength 0-4:30167} {Exam; arterial pulse strength 0-4:30167}  Brachial {Exam; arterial pulse strength 0-4:30167} {Exam; arterial pulse strength 0-4:30167}  Femoral {Exam;  arterial pulse strength 0-4:30167} {Exam; arterial pulse strength 0-4:30167}  Popliteal {Exam; arterial pulse strength 0-4:30167} {Exam; arterial pulse strength 0-4:30167}  DP {Exam; arterial pulse strength 0-4:30167} {Exam; arterial pulse strength 0-4:30167}  PT {Exam; arterial pulse strength 0-4:30167} {Exam; arterial pulse strength 0-4:30167}  Peroneal {Exam; arterial pulse strength 0-4:30167} {Exam; arterial pulse strength 0-4:30167}   Extremities:  {With/Without:20273} ischemic changes, {With/Without:20273} Gangrene, {With/Without:20273} cellulitis; {With/Without:20273} open wounds;  Musculoskeletal: no muscle wasting or atrophy  Neurologic: A&O X 3; Moving all extremities equally;  Speech is fluent/normal  Non-Invasive Vascular Imaging:   Upper Extremity Vein Mapping on ***:    ASSESSMENT/PLAN: 68 y.o. male with ESRD here for evaluation for hemodialysis access.  In October, he had a LUA graft removed for infection and needs new access.    -pt is *** handed.  - will plan for *** -pt *** on anticoagulation   Leontine Locket, PA-C [facility] [phone]  Clinic MD:   ***

## 2018-11-04 ENCOUNTER — Ambulatory Visit: Payer: Medicare PPO | Admitting: Family

## 2018-11-06 ENCOUNTER — Other Ambulatory Visit: Payer: Self-pay

## 2018-11-06 DIAGNOSIS — N186 End stage renal disease: Secondary | ICD-10-CM

## 2018-11-06 DIAGNOSIS — Z992 Dependence on renal dialysis: Principal | ICD-10-CM

## 2018-11-12 ENCOUNTER — Telehealth (HOSPITAL_COMMUNITY): Payer: Self-pay | Admitting: *Deleted

## 2018-11-12 ENCOUNTER — Ambulatory Visit (INDEPENDENT_AMBULATORY_CARE_PROVIDER_SITE_OTHER): Payer: Self-pay | Admitting: Physician Assistant

## 2018-11-12 ENCOUNTER — Encounter: Payer: Self-pay | Admitting: *Deleted

## 2018-11-12 ENCOUNTER — Other Ambulatory Visit: Payer: Self-pay

## 2018-11-12 ENCOUNTER — Ambulatory Visit (INDEPENDENT_AMBULATORY_CARE_PROVIDER_SITE_OTHER)
Admission: RE | Admit: 2018-11-12 | Discharge: 2018-11-12 | Disposition: A | Discharge status: Home / Self Care | Payer: Medicare PPO | Source: Ambulatory Visit | Attending: Vascular Surgery | Admitting: Vascular Surgery

## 2018-11-12 ENCOUNTER — Other Ambulatory Visit: Payer: Self-pay | Admitting: *Deleted

## 2018-11-12 ENCOUNTER — Ambulatory Visit (HOSPITAL_COMMUNITY)
Admission: RE | Admit: 2018-11-12 | Discharge: 2018-11-12 | Disposition: A | Discharge status: Home / Self Care | Payer: Medicare PPO | Source: Ambulatory Visit | Attending: Vascular Surgery | Admitting: Vascular Surgery

## 2018-11-12 VITALS — BP 135/79 | HR 74 | Temp 97.3°F | Resp 16 | Ht 66.0 in | Wt 132.0 lb

## 2018-11-12 DIAGNOSIS — N186 End stage renal disease: Secondary | ICD-10-CM

## 2018-11-12 DIAGNOSIS — Z992 Dependence on renal dialysis: Secondary | ICD-10-CM

## 2018-11-12 NOTE — Progress Notes (Signed)
Established Dialysis Access   History of Present Illness   Roger Vazquez is a 68 y.o. (December 24, 1950) male who presents for re-evaluation for permanent access.  He is status post removal of infected left upper arm loop graft by Dr. Donnetta Hutching on 08/06/2018.  He has since completed his IV antibiotic course and healed the remaining wounds on his left upper arm.  He returns to clinic today for vein mapping and evaluation for new right arm dialysis access.  He dialyzes from right IJ tunneled dialysis catheter on a Monday Wednesday Friday schedule without complication.  He is not taking any blood thinners.  He is willing to proceed with new dialysis access surgery.  He denies chest pain or shortness of breath  Current Outpatient Medications  Medication Sig Dispense Refill  . amLODipine (NORVASC) 10 MG tablet Take 5 mg by mouth daily.      Marland Kitchen aspirin 81 MG tablet Take 81 mg by mouth daily.      . blood glucose meter kit and supplies KIT Dispense based on patient and insurance preference. Use up to four times daily as directed. (FOR ICD-9 250.00, 250.01). For QAC - HS accuchecks. 1 each 1  . insulin aspart (NOVOLOG) 100 UNIT/ML injection Before each meal 3 times a day, 140-199 - 2 units, 200-250 - 4 units, 251-299 - 6 units,  300-349 - 8 units,  350 or above 10 units. Dispense syringes and needles as needed, Ok to switch to PEN if approved. Substitute to any brand approved. DX DM2, Code E11.65 1 vial 0  . insulin glargine (LANTUS) 100 UNIT/ML injection Inject 0.15 mLs (15 Units total) into the skin daily. Dispense insulin pen if approved, if not dispense as needed syringes and needles for 1 month supply. Can switch to Levemir. Diagnosis E 11.65. 10 mL 0  . Insulin Syringe-Needle U-100 25G X 1" 1 ML MISC For 4 times a day insulin SQ, 1 month supply. Diagnosis E11.65 30 each 0  . Multiple Vitamin (MULITIVITAMIN WITH MINERALS) TABS Take 1 tablet by mouth daily. 30 tablet 0  . hydrALAZINE (APRESOLINE) 25 MG  tablet Take 2 tablets (50 mg total) by mouth every 8 (eight) hours. 90 tablet 3  . pantoprazole (PROTONIX) 40 MG tablet Take 1 tablet (40 mg total) by mouth 2 (two) times daily. 60 tablet 3   No current facility-administered medications for this visit.     On ROS today: 10 system ROS is negative unless otherwise noted in HPI   Physical Examination   Vitals:   11/12/18 1052  BP: 135/79  Pulse: 74  Resp: 16  Temp: (!) 97.3 F (36.3 C)  TempSrc: Oral  SpO2: 99%  Weight: 132 lb (59.9 kg)  Height: _0  (1.676 m)   Body mass index is 21.31 kg/m.  General Alert, O x 3, WD, NAD  Pulmonary Sym exp, good B air movt, CTA B  Cardiac RRR, Nl S1, S2,   Vascular Vessel Right Left  Radial Palpable Palpable  Brachial Palpable Palpable  Ulnar Not palpable Not palpable    Musculo- skeletal  incisions of left upper arm are healed  Neurologic A&O; CN grossly intact     Non-invasive Vascular Imaging   RUE Doppler:  R arm:   Brachial: bi, 42 mm  Radial: bi, 19 mm  Ulnar: bi, 34 mm  RUE Vein Mapping:   R arm: acceptable vein conduits include basilic vein    Medical Decision Making   Roger Vazquez is  a 68 y.o. male who presents with ESRD requiring hemodialysis.    Incisions of left arm are now completely healed and he has completed his IV antibiotic course  Right arm vein mapping shows a marginal basilic vein for conduit use  Plan will be for right arm AV fistula versus graft on a Tuesday or Thursday in the near future  He is aware that if the basilic vein is used he will likely require a second surgery prior to being able to access brachiobasilic fistula Risk, benefits, and alternatives to access surgery were discussed.   The patient is aware the risks include but are not limited to: bleeding, infection, steal syndrome, nerve damage, thrombosis, failure to mature, and need for additional procedures. The patient agrees to proceed forward with the  procedure.   Dagoberto Ligas PA-C [facility] [place] Office: [phone]

## 2018-11-12 NOTE — H&P (View-Only) (Signed)
  Established Dialysis Access   History of Present Illness   Roger Vazquez is a 67 y.o. (06/20/1951) male who presents for re-evaluation for permanent access.  He is status post removal of infected left upper arm loop graft by Dr. Early on 08/06/2018.  He has since completed his IV antibiotic course and healed the remaining wounds on his left upper arm.  He returns to clinic today for vein mapping and evaluation for new right arm dialysis access.  He dialyzes from right IJ tunneled dialysis catheter on a Monday Wednesday Friday schedule without complication.  He is not taking any blood thinners.  He is willing to proceed with new dialysis access surgery.  He denies chest pain or shortness of breath  Current Outpatient Medications  Medication Sig Dispense Refill  . amLODipine (NORVASC) 10 MG tablet Take 5 mg by mouth daily.      . aspirin 81 MG tablet Take 81 mg by mouth daily.      . blood glucose meter kit and supplies KIT Dispense based on patient and insurance preference. Use up to four times daily as directed. (FOR ICD-9 250.00, 250.01). For QAC - HS accuchecks. 1 each 1  . insulin aspart (NOVOLOG) 100 UNIT/ML injection Before each meal 3 times a day, 140-199 - 2 units, 200-250 - 4 units, 251-299 - 6 units,  300-349 - 8 units,  350 or above 10 units. Dispense syringes and needles as needed, Ok to switch to PEN if approved. Substitute to any brand approved. DX DM2, Code E11.65 1 vial 0  . insulin glargine (LANTUS) 100 UNIT/ML injection Inject 0.15 mLs (15 Units total) into the skin daily. Dispense insulin pen if approved, if not dispense as needed syringes and needles for 1 month supply. Can switch to Levemir. Diagnosis E 11.65. 10 mL 0  . Insulin Syringe-Needle U-100 25G X 1" 1 ML MISC For 4 times a day insulin SQ, 1 month supply. Diagnosis E11.65 30 each 0  . Multiple Vitamin (MULITIVITAMIN WITH MINERALS) TABS Take 1 tablet by mouth daily. 30 tablet 0  . hydrALAZINE (APRESOLINE) 25 MG  tablet Take 2 tablets (50 mg total) by mouth every 8 (eight) hours. 90 tablet 3  . pantoprazole (PROTONIX) 40 MG tablet Take 1 tablet (40 mg total) by mouth 2 (two) times daily. 60 tablet 3   No current facility-administered medications for this visit.     On ROS today: 10 system ROS is negative unless otherwise noted in HPI   Physical Examination   Vitals:   11/12/18 1052  BP: 135/79  Pulse: 74  Resp: 16  Temp: (!) 97.3 F (36.3 C)  TempSrc: Oral  SpO2: 99%  Weight: 132 lb (59.9 kg)  Height: 5' 6" (1.676 m)   Body mass index is 21.31 kg/m.  General Alert, O x 3, WD, NAD  Pulmonary Sym exp, good B air movt, CTA B  Cardiac RRR, Nl S1, S2,   Vascular Vessel Right Left  Radial Palpable Palpable  Brachial Palpable Palpable  Ulnar Not palpable Not palpable    Musculo- skeletal  incisions of left upper arm are healed  Neurologic A&O; CN grossly intact     Non-invasive Vascular Imaging   RUE Doppler:  R arm:   Brachial: bi, 42 mm  Radial: bi, 19 mm  Ulnar: bi, 34 mm  RUE Vein Mapping:   R arm: acceptable vein conduits include basilic vein    Medical Decision Making   Roger Vazquez is   a 67 y.o. male who presents with ESRD requiring hemodialysis.    Incisions of left arm are now completely healed and he has completed his IV antibiotic course  Right arm vein mapping shows a marginal basilic vein for conduit use  Plan will be for right arm AV fistula versus graft on a Tuesday or Thursday in the near future  He is aware that if the basilic vein is used he will likely require a second surgery prior to being able to access brachiobasilic fistula Risk, benefits, and alternatives to access surgery were discussed.   The patient is aware the risks include but are not limited to: bleeding, infection, steal syndrome, nerve damage, thrombosis, failure to mature, and need for additional procedures. The patient agrees to proceed forward with the  procedure.   Emmaleigh Longo PA-C [facility] [place] Office: [phone] 

## 2018-11-25 ENCOUNTER — Encounter (HOSPITAL_COMMUNITY): Payer: Self-pay | Admitting: *Deleted

## 2018-11-25 ENCOUNTER — Other Ambulatory Visit: Payer: Self-pay

## 2018-11-26 NOTE — Anesthesia Preprocedure Evaluation (Addendum)
Anesthesia Evaluation  Patient identified by MRN, date of birth, ID band Patient awake    Reviewed: Allergy & Precautions, NPO status , Patient's Chart, lab work & pertinent test results  History of Anesthesia Complications Negative for: history of anesthetic complications  Airway Mallampati: I  TM Distance: >3 FB Neck ROM: Full    Dental  (+) Edentulous Upper, Edentulous Lower, Dental Advisory Given   Pulmonary former smoker,    Pulmonary exam normal  (-) decreased breath sounds      Cardiovascular hypertension, Pt. on medications Normal cardiovascular exam  Study Conclusions  - Left ventricle: The cavity size was normal. Moderate LVH.   Systolic function was normal. The estimated ejection fraction was   in the range of 60% to 65%. Wall motion was normal; there were no   regional wall motion abnormalities. - Aortic valve: Structurally normal valve. Trileaflet. No evidence   of vegetation. - Aorta: Mild atheromatous disease. - Mitral valve: No evidence of vegetation. - Left atrium: Mildly dilated. No evidence of thrombus in the   atrial cavity or appendage. - Right atrium: No evidence of thrombus in the atrial cavity or   appendage. - Atrial septum: No defect or patent foramen ovale was identified. - Tricuspid valve: No evidence of vegetation. - Pulmonic valve: No evidence of vegetation. - Pericardium, extracardiac: A trivial pericardial effusion was   identified.   Neuro/Psych PSYCHIATRIC DISORDERS    GI/Hepatic Neg liver ROS, GERD  Medicated,  Endo/Other  diabetes, Type 2, Insulin Dependent  Renal/GU ESRF and DialysisRenal disease     Musculoskeletal negative musculoskeletal ROS (+)   Abdominal Normal abdominal exam  (+)   Peds  Hematology   Anesthesia Other Findings   Reproductive/Obstetrics                            Lab Results  Component Value Date   WBC 6.8 08/11/2018    HGB 7.6 (L) 08/11/2018   HCT 23.8 (L) 08/11/2018   MCV 102.6 (H) 08/11/2018   PLT 221 08/11/2018   Lab Results  Component Value Date   CREATININE 10.88 (H) 08/11/2018   BUN 43 (H) 08/11/2018   NA 132 (L) 08/11/2018   K 5.2 (H) 08/11/2018   CL 95 (L) 08/11/2018   CO2 26 08/11/2018   Lab Results  Component Value Date   INR 1.08 07/31/2018   INR 0.99 07/27/2018   INR 0.97 10/07/2011   Echo:- Left ventricle: The cavity size was normal. Moderate LVH.   Systolic function was normal. The estimated ejection fraction was   in the range of 60% to 65%. Wall motion was normal; there were no   regional wall motion abnormalities. - Aortic valve: Structurally normal valve. Trileaflet. No evidence   of vegetation. - Aorta: Mild atheromatous disease. - Mitral valve: No evidence of vegetation. - Left atrium: Mildly dilated. No evidence of thrombus in the   atrial cavity or appendage. - Right atrium: No evidence of thrombus in the atrial cavity or   appendage. - Atrial septum: No defect or patent foramen ovale was identified. - Tricuspid valve: No evidence of vegetation. - Pulmonic valve: No evidence of vegetation. - Pericardium, extracardiac: A trivial pericardial effusion was   identified.   Anesthesia Physical  Anesthesia Plan  ASA: III  Anesthesia Plan: MAC   Post-op Pain Management:    Induction: Intravenous  PONV Risk Score and Plan: 2 and Ondansetron and Propofol infusion  Airway  Management Planned: Simple Face Mask and Natural Airway  Additional Equipment: None  Intra-op Plan:   Post-operative Plan:   Informed Consent: I have reviewed the patients History and Physical, chart, labs and discussed the procedure including the risks, benefits and alternatives for the proposed anesthesia with the patient or authorized representative who has indicated his/her understanding and acceptance.     Dental advisory given  Plan Discussed with: CRNA and  Anesthesiologist  Anesthesia Plan Comments:        Anesthesia Quick Evaluation

## 2018-11-27 ENCOUNTER — Ambulatory Visit (HOSPITAL_COMMUNITY)
Admission: RE | Admit: 2018-11-27 | Discharge: 2018-11-27 | Disposition: A | Discharge status: Home / Self Care | Payer: Medicare PPO | Attending: Vascular Surgery | Admitting: Vascular Surgery

## 2018-11-27 ENCOUNTER — Ambulatory Visit (HOSPITAL_COMMUNITY): Payer: Medicare PPO | Admitting: Anesthesiology

## 2018-11-27 ENCOUNTER — Encounter (HOSPITAL_COMMUNITY)
Admission: RE | Disposition: A | Discharge status: Home / Self Care | Payer: Self-pay | Source: Home / Self Care | Attending: Vascular Surgery

## 2018-11-27 ENCOUNTER — Other Ambulatory Visit: Payer: Self-pay

## 2018-11-27 ENCOUNTER — Encounter (HOSPITAL_COMMUNITY): Payer: Self-pay

## 2018-11-27 DIAGNOSIS — N186 End stage renal disease: Secondary | ICD-10-CM | POA: Insufficient documentation

## 2018-11-27 DIAGNOSIS — Z79899 Other long term (current) drug therapy: Secondary | ICD-10-CM | POA: Insufficient documentation

## 2018-11-27 DIAGNOSIS — I120 Hypertensive chronic kidney disease with stage 5 chronic kidney disease or end stage renal disease: Secondary | ICD-10-CM | POA: Diagnosis present

## 2018-11-27 DIAGNOSIS — Z87891 Personal history of nicotine dependence: Secondary | ICD-10-CM | POA: Insufficient documentation

## 2018-11-27 DIAGNOSIS — K219 Gastro-esophageal reflux disease without esophagitis: Secondary | ICD-10-CM | POA: Diagnosis not present

## 2018-11-27 DIAGNOSIS — Z794 Long term (current) use of insulin: Secondary | ICD-10-CM | POA: Diagnosis not present

## 2018-11-27 DIAGNOSIS — E1122 Type 2 diabetes mellitus with diabetic chronic kidney disease: Secondary | ICD-10-CM | POA: Insufficient documentation

## 2018-11-27 DIAGNOSIS — N185 Chronic kidney disease, stage 5: Secondary | ICD-10-CM | POA: Diagnosis not present

## 2018-11-27 HISTORY — DX: Malignant (primary) neoplasm, unspecified: C80.1

## 2018-11-27 HISTORY — PX: AV FISTULA PLACEMENT: SHX1204

## 2018-11-27 LAB — SURGICAL PCR SCREEN
MRSA, PCR: NEGATIVE
Staphylococcus aureus: NEGATIVE

## 2018-11-27 LAB — POCT I-STAT 4, (NA,K, GLUC, HGB,HCT)
Glucose, Bld: 201 mg/dL — ABNORMAL HIGH (ref 70–99)
HCT: 33 % — ABNORMAL LOW (ref 39.0–52.0)
Hemoglobin: 11.2 g/dL — ABNORMAL LOW (ref 13.0–17.0)
Potassium: 4.5 mmol/L (ref 3.5–5.1)
Sodium: 138 mmol/L (ref 135–145)

## 2018-11-27 LAB — GLUCOSE, CAPILLARY
Glucose-Capillary: 187 mg/dL — ABNORMAL HIGH (ref 70–99)
Glucose-Capillary: 158 mg/dL — ABNORMAL HIGH (ref 70–99)

## 2018-11-27 SURGERY — INSERTION OF ARTERIOVENOUS (AV) GORE-TEX GRAFT ARM
Anesthesia: Monitor Anesthesia Care | Site: Arm Lower | Laterality: Right

## 2018-11-27 MED ORDER — SODIUM CHLORIDE 0.9 % IV SOLN
INTRAVENOUS | Status: DC
  Administered 2018-11-27: 07:00:00 via INTRAVENOUS

## 2018-11-27 MED ORDER — MIDAZOLAM HCL 5 MG/5ML IJ SOLN
INTRAMUSCULAR | Status: DC | PRN
  Administered 2018-11-27 (×2): 1 mg via INTRAVENOUS

## 2018-11-27 MED ORDER — CEFAZOLIN SODIUM-DEXTROSE 2-4 GM/100ML-% IV SOLN
2.0000 g | INTRAVENOUS | Status: AC
  Administered 2018-11-27: 2 g via INTRAVENOUS
  Filled 2018-11-27: qty 100

## 2018-11-27 MED ORDER — PHENYLEPHRINE 40 MCG/ML (10ML) SYRINGE FOR IV PUSH (FOR BLOOD PRESSURE SUPPORT)
PREFILLED_SYRINGE | INTRAVENOUS | Status: DC | PRN
  Administered 2018-11-27: 120 ug via INTRAVENOUS
  Administered 2018-11-27 (×2): 80 ug via INTRAVENOUS
  Administered 2018-11-27: 120 ug via INTRAVENOUS

## 2018-11-27 MED ORDER — LIDOCAINE 2% (20 MG/ML) 5 ML SYRINGE
INTRAMUSCULAR | Status: DC | PRN
  Administered 2018-11-27: 40 mg via INTRAVENOUS

## 2018-11-27 MED ORDER — PHENYLEPHRINE 40 MCG/ML (10ML) SYRINGE FOR IV PUSH (FOR BLOOD PRESSURE SUPPORT)
PREFILLED_SYRINGE | INTRAVENOUS | Status: AC
  Filled 2018-11-27: qty 10

## 2018-11-27 MED ORDER — PROPOFOL 10 MG/ML IV BOLUS
INTRAVENOUS | Status: AC
  Filled 2018-11-27: qty 20

## 2018-11-27 MED ORDER — SODIUM CHLORIDE 0.9 % IV SOLN
INTRAVENOUS | Status: DC | PRN
  Administered 2018-11-27: 20 ug/min via INTRAVENOUS

## 2018-11-27 MED ORDER — ACETAMINOPHEN 500 MG PO TABS
1000.0000 mg | ORAL_TABLET | Freq: Once | ORAL | Status: AC
  Administered 2018-11-27: 1000 mg via ORAL
  Filled 2018-11-27: qty 2

## 2018-11-27 MED ORDER — ALBUMIN HUMAN 5 % IV SOLN
INTRAVENOUS | Status: AC
  Filled 2018-11-27: qty 250

## 2018-11-27 MED ORDER — PROPOFOL 10 MG/ML IV BOLUS
INTRAVENOUS | Status: DC | PRN
  Administered 2018-11-27: 10 mg via INTRAVENOUS
  Administered 2018-11-27: 20 mg via INTRAVENOUS
  Administered 2018-11-27 (×3): 10 mg via INTRAVENOUS

## 2018-11-27 MED ORDER — OXYCODONE-ACETAMINOPHEN 5-325 MG PO TABS: 1.0000 | ORAL_TABLET | Freq: Four times a day (QID) | ORAL | 0 refills | Status: DC | PRN

## 2018-11-27 MED ORDER — MIDAZOLAM HCL 2 MG/2ML IJ SOLN
INTRAMUSCULAR | Status: AC
  Filled 2018-11-27: qty 2

## 2018-11-27 MED ORDER — PROPOFOL 500 MG/50ML IV EMUL
INTRAVENOUS | Status: DC | PRN
  Administered 2018-11-27: 75 ug/kg/min via INTRAVENOUS

## 2018-11-27 MED ORDER — ONDANSETRON HCL 4 MG/2ML IJ SOLN
INTRAMUSCULAR | Status: DC | PRN
  Administered 2018-11-27: 4 mg via INTRAVENOUS

## 2018-11-27 MED ORDER — MUPIROCIN 2 % EX OINT: 1.0000 "application " | TOPICAL_OINTMENT | Freq: Once | CUTANEOUS | Status: DC

## 2018-11-27 MED ORDER — MUPIROCIN 2 % EX OINT
TOPICAL_OINTMENT | CUTANEOUS | Status: AC
  Filled 2018-11-27: qty 22

## 2018-11-27 MED ORDER — SODIUM CHLORIDE 0.9 % IV SOLN
INTRAVENOUS | Status: AC
  Filled 2018-11-27: qty 1.2

## 2018-11-27 MED ORDER — LIDOCAINE 2% (20 MG/ML) 5 ML SYRINGE
INTRAMUSCULAR | Status: AC
  Filled 2018-11-27: qty 5

## 2018-11-27 MED ORDER — LIDOCAINE-EPINEPHRINE 0.5 %-1:200000 IJ SOLN
INTRAMUSCULAR | Status: AC
  Filled 2018-11-27: qty 1

## 2018-11-27 MED ORDER — LIDOCAINE-EPINEPHRINE 0.5 %-1:200000 IJ SOLN
INTRAMUSCULAR | Status: DC | PRN
  Administered 2018-11-27: 50 mL

## 2018-11-27 MED ORDER — ALBUMIN HUMAN 5 % IV SOLN
12.5000 g | Freq: Once | INTRAVENOUS | Status: AC
  Administered 2018-11-27: 12.5 g via INTRAVENOUS

## 2018-11-27 MED ORDER — PROPOFOL 1000 MG/100ML IV EMUL
INTRAVENOUS | Status: AC
  Filled 2018-11-27: qty 100

## 2018-11-27 MED ORDER — SODIUM CHLORIDE 0.9 % IV SOLN
INTRAVENOUS | Status: DC | PRN
  Administered 2018-11-27: 07:00:00

## 2018-11-27 MED ORDER — FENTANYL CITRATE (PF) 100 MCG/2ML IJ SOLN
INTRAMUSCULAR | Status: DC | PRN
  Administered 2018-11-27 (×2): 25 ug via INTRAVENOUS

## 2018-11-27 MED ORDER — FENTANYL CITRATE (PF) 250 MCG/5ML IJ SOLN
INTRAMUSCULAR | Status: AC
  Filled 2018-11-27: qty 5

## 2018-11-27 MED ORDER — DEXMEDETOMIDINE HCL IN NACL 200 MCG/50ML IV SOLN
INTRAVENOUS | Status: DC | PRN
  Administered 2018-11-27 (×2): 8 ug via INTRAVENOUS

## 2018-11-27 MED ORDER — 0.9 % SODIUM CHLORIDE (POUR BTL) OPTIME
TOPICAL | Status: DC | PRN
  Administered 2018-11-27: 1000 mL

## 2018-11-27 SURGICAL SUPPLY — 38 items
ADH SKN CLS APL DERMABOND .7 (GAUZE/BANDAGES/DRESSINGS) ×2
ARMBAND PINK RESTRICT EXTREMIT (MISCELLANEOUS) ×8 IMPLANT
CANISTER SUCT 3000ML PPV (MISCELLANEOUS) ×4 IMPLANT
CANNULA VESSEL 3MM 2 BLNT TIP (CANNULA) ×4 IMPLANT
CLIP LIGATING EXTRA MED SLVR (CLIP) ×4 IMPLANT
CLIP LIGATING EXTRA SM BLUE (MISCELLANEOUS) ×4 IMPLANT
COVER PROBE W GEL 5X96 (DRAPES) ×4 IMPLANT
COVER WAND RF STERILE (DRAPES) ×4 IMPLANT
DECANTER SPIKE VIAL GLASS SM (MISCELLANEOUS) ×4 IMPLANT
DERMABOND ADVANCED (GAUZE/BANDAGES/DRESSINGS) ×2
DERMABOND ADVANCED .7 DNX12 (GAUZE/BANDAGES/DRESSINGS) ×2 IMPLANT
ELECT REM PT RETURN 9FT ADLT (ELECTROSURGICAL) ×4
ELECTRODE REM PT RTRN 9FT ADLT (ELECTROSURGICAL) ×2 IMPLANT
GLOVE BIOGEL PI IND STRL 6.5 (GLOVE) ×2 IMPLANT
GLOVE BIOGEL PI IND STRL 7.0 (GLOVE) ×2 IMPLANT
GLOVE BIOGEL PI IND STRL 7.5 (GLOVE) ×1 IMPLANT
GLOVE BIOGEL PI INDICATOR 6.5 (GLOVE) ×4
GLOVE BIOGEL PI INDICATOR 7.0 (GLOVE) ×4
GLOVE BIOGEL PI INDICATOR 7.5 (GLOVE) ×2
GLOVE ECLIPSE 6.5 STRL STRAW (GLOVE) ×3 IMPLANT
GLOVE ECLIPSE 7.0 STRL STRAW (GLOVE) ×3 IMPLANT
GLOVE SS BIOGEL STRL SZ 7.5 (GLOVE) ×2 IMPLANT
GLOVE SUPERSENSE BIOGEL SZ 7.5 (GLOVE) ×2
GLOVE SURG SS PI 6.5 STRL IVOR (GLOVE) ×3 IMPLANT
GOWN STRL REUS W/ TWL LRG LVL3 (GOWN DISPOSABLE) ×6 IMPLANT
GOWN STRL REUS W/TWL LRG LVL3 (GOWN DISPOSABLE) ×12
GRAFT GORETEX STRT 4-7X45 (Vascular Products) ×3 IMPLANT
KIT BASIN OR (CUSTOM PROCEDURE TRAY) ×4 IMPLANT
KIT TURNOVER KIT B (KITS) ×4 IMPLANT
NS IRRIG 1000ML POUR BTL (IV SOLUTION) ×4 IMPLANT
PACK CV ACCESS (CUSTOM PROCEDURE TRAY) ×4 IMPLANT
PAD ARMBOARD 7.5X6 YLW CONV (MISCELLANEOUS) ×8 IMPLANT
SUT PROLENE 6 0 CC (SUTURE) ×10 IMPLANT
SUT VIC AB 3-0 SH 27 (SUTURE) ×4
SUT VIC AB 3-0 SH 27X BRD (SUTURE) ×2 IMPLANT
TOWEL GREEN STERILE (TOWEL DISPOSABLE) ×4 IMPLANT
UNDERPAD 30X30 (UNDERPADS AND DIAPERS) ×4 IMPLANT
WATER STERILE IRR 1000ML POUR (IV SOLUTION) ×4 IMPLANT

## 2018-11-27 NOTE — Op Note (Signed)
    OPERATIVE REPORT  DATE OF SURGERY: 11/27/2018  PATIENT: Roger Vazquez, 68 y.o. male MRN: [id number]  DOB: [birth year]  PRE-OPERATIVE DIAGNOSIS: End-stage renal disease  POST-OPERATIVE DIAGNOSIS:  Same  PROCEDURE: Left forearm loop AV Gore-Tex graft  SURGEON:  Curt Jews, M.D.  PHYSICIAN ASSISTANT: Nurse  ANESTHESIA: Local with sedation  EBL: per anesthesia record  Total I/O In: 300 [I.V.:300] Out: 10 [Blood:10]  BLOOD ADMINISTERED: none  DRAINS: none  SPECIMEN: none  COUNTS CORRECT:  YES  PATIENT DISPOSITION:  PACU - hemodynamically stable  PROCEDURE DETAILS: Patient was taken to the operating placed supine position where the area of the left arm was prepped draped in a sterile fashion.  SonoSite ultrasound was used to visualize the veins.  The patient had a small cephalic and basilic vein.  Incision was made using local anesthesia over the antecubital space and carried down to isolate the brachial artery and the brachial vein at the antecubital space.  These were of moderate size.  There was moderate atherosclerotic change in the artery.  A separate incision was made over the distal forearm in a loop configuration with tunnel was created.  A 4 x 7 taper Gore-Tex graft was brought through the tunnel.  The brachial artery was occluded proximally distally and was opened with 11 blade sent longitudinally with Potts scissors.  The 4 mm portion of the graft was spatulated to allow approximately 5 mm diameter graft.  This was sewn end-to-side to the artery with a running 6-0 Prolene suture.  This anastomosis was tested and found to be adequate.  The graft was flushed with heparinized saline and reoccluded.  The brachial vein was then occluded proximally and distally and was opened with an 11 blade and sent longitudinally with Potts scissors.  The 7 mm portion of the graft was cut the appropriate length and was spatulated and sewn end-to-side to the vein with a running 6-0 Prolene  suture.  Clamps removed and excellent thrill was noted.  The wounds were irrigated with saline.  Hemostasis electrocautery.  The wounds were closed with 3-0 Vicryl in the subcutaneous and subcuticular tissue.  Sterile dressing was applied and the patient was transferred to the recovery room in stable condition   Rosetta Posner, M.D., [facility] 11/27/2018 9:22 AM

## 2018-11-27 NOTE — Anesthesia Postprocedure Evaluation (Signed)
Anesthesia Post Note  Patient: Roger Vazquez  Procedure(s) Performed: INSERTION 4-7MM X 45CM GORETEX GRAFT RIGHT FOREARM  (Right Arm Lower)     Patient location during evaluation: PACU Anesthesia Type: MAC Level of consciousness: awake and alert Pain management: pain level controlled Vital Signs Assessment: post-procedure vital signs reviewed and stable Respiratory status: spontaneous breathing and respiratory function stable Cardiovascular status: stable Postop Assessment: no apparent nausea or vomiting Anesthetic complications: no    Last Vitals:  Vitals:   11/27/18 1012 11/27/18 1013  BP: 102/61   Pulse: 74   Resp: 15   Temp:  (!) 36.4 C  SpO2: 100%     Last Pain:  Vitals:   11/27/18 1000  TempSrc:   PainSc: 0-No pain                 Kody Brandl DANIEL

## 2018-11-27 NOTE — Interval H&P Note (Signed)
History and Physical Interval Note:  11/27/2018 7:17 AM  Roger Vazquez  has presented today for surgery, with the diagnosis of END STAGE RENAL DISEASE  The various methods of treatment have been discussed with the patient and family. After consideration of risks, benefits and other options for treatment, the patient has consented to  Procedure(s): ARTERIOVENOUS (AV) FISTULA CREATION VERSUS GRAFT PLACEMENT (Right) as a surgical intervention .  The patient's history has been reviewed, patient examined, no change in status, stable for surgery.  I have reviewed the patient's chart and labs.  Questions were answered to the patient's satisfaction.     Curt Jews

## 2018-11-27 NOTE — Discharge Instructions (Signed)
° °  [facility] ° °Discharge Instructions ° °AV Fistula or Graft Surgery for Dialysis Access ° °Please refer to the following instructions for your post-procedure care. Your surgeon or physician assistant will discuss any changes with you. ° °Activity ° °You may drive the day following your surgery, if you are comfortable and no longer taking prescription pain medication. Resume full activity as the soreness in your incision resolves. ° °Bathing/Showering ° °You may shower after you go home. Keep your incision dry for 48 hours. Do not soak in a bathtub, hot tub, or swim until the incision heals completely. You may not shower if you have a hemodialysis catheter. ° °Incision Care ° °Clean your incision with mild soap and water after 48 hours. Pat the area dry with a clean towel. You do not need a bandage unless otherwise instructed. Do not apply any ointments or creams to your incision. You may have skin glue on your incision. Do not peel it off. It will come off on its own in about one week. Your arm may swell a bit after surgery. To reduce swelling use pillows to elevate your arm so it is above your heart. Your doctor will tell you if you need to lightly wrap your arm with an ACE bandage. ° °Diet ° °Resume your normal diet. There are not special food restrictions following this procedure. In order to heal from your surgery, it is CRITICAL to get adequate nutrition. Your body requires vitamins, minerals, and protein. Vegetables are the best source of vitamins and minerals. Vegetables also provide the perfect balance of protein. Processed food has little nutritional value, so try to avoid this. ° °Medications ° °Resume taking all of your medications. If your incision is causing pain, you may take over-the counter pain relievers such as acetaminophen (Tylenol). If you were prescribed a stronger pain medication, please be aware these medications can cause nausea and constipation. Prevent  nausea by taking the medication with a snack or meal. Avoid constipation by drinking plenty of fluids and eating foods with high amount of fiber, such as fruits, vegetables, and grains. Do not take Tylenol if you are taking prescription pain medications. ° ° ° ° °Follow up °Your surgeon may want to see you in the office following your access surgery. If so, this will be arranged at the time of your surgery. ° °Please call us immediately for any of the following conditions: ° °Increased pain, redness, drainage (pus) from your incision site °Fever of 101 degrees or higher °Severe or worsening pain at your incision site °Hand pain or numbness. ° °Reduce your risk of vascular disease: ° °Stop smoking. If you would like help, call [facility] at 1-800-QUIT-NOW (1-800-784-8669) or [facility] at 336-586-4000 ° °Manage your cholesterol °Maintain a desired weight °Control your diabetes °Keep your blood pressure down ° °Dialysis ° °It will take several weeks to several months for your new dialysis access to be ready for use. Your surgeon will determine when it is OK to use it. Your nephrologist will continue to direct your dialysis. You can continue to use your Permcath until your new access is ready for use. ° °If you have any questions, please call the office at [phone]. ° °

## 2018-11-27 NOTE — Transfer of Care (Signed)
Immediate Anesthesia Transfer of Care Note  Patient: Roger Vazquez  Procedure(s) Performed: INSERTION 4-7MM X 45CM GORETEX GRAFT RIGHT FOREARM  (Right Arm Lower)  Patient Location: PACU  Anesthesia Type:MAC  Level of Consciousness: awake, alert  and oriented  Airway & Oxygen Therapy: Patient Spontanous Breathing and Patient connected to face mask oxygen  Post-op Assessment: Report given to RN and Post -op Vital signs reviewed and stable  Post vital signs: Reviewed and stable  Last Vitals:  Vitals Value Taken Time  BP 121/68 11/27/2018  9:09 AM  Temp    Pulse 68 11/27/2018  9:10 AM  Resp 19 11/27/2018  9:10 AM  SpO2 100 % 11/27/2018  9:10 AM  Vitals shown include unvalidated device data.  Last Pain:  Vitals:   11/27/18 0606  TempSrc: Oral  PainSc:          Complications: No apparent anesthesia complications

## 2018-11-28 ENCOUNTER — Encounter (HOSPITAL_COMMUNITY): Payer: Self-pay | Admitting: Vascular Surgery

## 2018-12-03 ENCOUNTER — Encounter: Payer: Self-pay | Admitting: Family

## 2018-12-03 ENCOUNTER — Telehealth: Payer: Self-pay

## 2018-12-03 ENCOUNTER — Ambulatory Visit: Payer: Medicare PPO | Admitting: Physician Assistant

## 2018-12-03 NOTE — Progress Notes (Deleted)
  POST OPERATIVE OFFICE NOTE    CC:  F/u for surgery  HPI:  This is a 68 y.o. male who is s/p left forearm loop graft placement on 11/27/2018 by Dr. Donnetta Hutching.  He dialyzes M/W/F at ***.   No Known Allergies  Current Outpatient Medications  Medication Sig Dispense Refill  . aspirin 81 MG tablet Take 81 mg by mouth daily.      . blood glucose meter kit and supplies KIT Dispense based on patient and insurance preference. Use up to four times daily as directed. (FOR ICD-9 250.00, 250.01). For QAC - HS accuchecks. 1 each 1  . calcium acetate (PHOSLO) 667 MG capsule Take 667 mg by mouth 3 (three) times daily with meals.    . hydrALAZINE (APRESOLINE) 25 MG tablet Take 2 tablets (50 mg total) by mouth every 8 (eight) hours. (Patient not taking: Reported on 11/20/2018) 90 tablet 3  . insulin aspart (NOVOLOG) 100 UNIT/ML injection Before each meal 3 times a day, 140-199 - 2 units, 200-250 - 4 units, 251-299 - 6 units,  300-349 - 8 units,  350 or above 10 units. Dispense syringes and needles as needed, Ok to switch to PEN if approved. Substitute to any brand approved. DX DM2, Code E11.65 (Patient taking differently: Inject 2-8 Units into the skin See admin instructions. Before each meal 3 times a day, 140-199 - 2 units, 200-250 - 4 units, 251-299 - 6 units,  300-349 - 8 units,  350 or above 10 units. Dispense syringes and needles as needed, Ok to switch to PEN if approved. Substitute to any brand approved. DX DM2, Code E11.65) 1 vial 0  . insulin glargine (LANTUS) 100 UNIT/ML injection Inject 0.15 mLs (15 Units total) into the skin daily. Dispense insulin pen if approved, if not dispense as needed syringes and needles for 1 month supply. Can switch to Levemir. Diagnosis E 11.65. (Patient not taking: Reported on 11/20/2018) 10 mL 0  . Insulin Syringe-Needle U-100 25G X 1" 1 ML MISC For 4 times a day insulin SQ, 1 month supply. Diagnosis E11.65 30 each 0  . Multiple Vitamin (MULITIVITAMIN WITH MINERALS) TABS Take 1  tablet by mouth daily. (Patient not taking: Reported on 11/20/2018) 30 tablet 0  . oxyCODONE-acetaminophen (PERCOCET/ROXICET) 5-325 MG tablet Take 1 tablet by mouth every 6 (six) hours as needed. 10 tablet 0  . pantoprazole (PROTONIX) 40 MG tablet Take 1 tablet (40 mg total) by mouth 2 (two) times daily. (Patient taking differently: Take 40 mg by mouth daily as needed (heartburn). ) 60 tablet 3   No current facility-administered medications for this visit.      ROS:  See HPI  Physical Exam:  ***  Incision:  *** Extremities:  *** Neuro: *** Abdomen:  ***  Assessment/Plan:  This is a 68 y.o. male who is s/p: ***  -***   Leontine Locket, PA-C [facility] [phone]  Clinic MD:  ***

## 2018-12-03 NOTE — Telephone Encounter (Signed)
Returned call to [place] at dialysis center. Stated patient's arm with fistula was red, swollen and warm to touch. We had opening at 1130 this morning. Patient just finished at dialysis and coming over from dialysis then.

## 2019-01-05 ENCOUNTER — Telehealth: Payer: Self-pay | Admitting: Vascular Surgery

## 2019-01-05 NOTE — Telephone Encounter (Signed)
The above patient or their representative was contacted and gave the following answers to these questions:         Do you have any of the following symptoms?  Fever                    Cough                   Shortness of breath  Do  you have any of the following other symptoms?    muscle pain         vomiting,        diarrhea        rash         weakness        red eye        abdominal pain         bruising          bruising or bleeding              joint pain           severe headache    Have you been in contact with someone who was or has been sick in the past 2 weeks? NO    Does the person that you were in contact with have any of the following symptoms?  NO  Cough         shortness of breath           muscle pain         vomiting,            diarrhea            rash            weakness           fever            red eye           abdominal pain           bruising  or  bleeding                joint pain                severe headache               Have you  or someone you have been in contact with traveled internationally in th last month?         NO   Have you  or someone you have been in contact with traveled outside [place] in th last month?         NO   COMMENTS OR ACTION PLAN FOR THIS PATIENT:

## 2019-01-06 ENCOUNTER — Encounter: Payer: Self-pay | Admitting: Family

## 2019-01-06 ENCOUNTER — Other Ambulatory Visit: Payer: Self-pay

## 2019-01-06 ENCOUNTER — Ambulatory Visit: Payer: Medicare PPO | Admitting: Family

## 2019-01-06 ENCOUNTER — Ambulatory Visit (INDEPENDENT_AMBULATORY_CARE_PROVIDER_SITE_OTHER): Payer: Self-pay | Admitting: Family

## 2019-01-06 VITALS — BP 108/65 | HR 87 | Temp 97.4°F | Resp 20 | Ht 66.0 in | Wt 135.0 lb

## 2019-01-06 DIAGNOSIS — N186 End stage renal disease: Secondary | ICD-10-CM

## 2019-01-06 DIAGNOSIS — Z992 Dependence on renal dialysis: Secondary | ICD-10-CM

## 2019-01-06 DIAGNOSIS — Z9889 Other specified postprocedural states: Secondary | ICD-10-CM

## 2019-01-06 DIAGNOSIS — Z95828 Presence of other vascular implants and grafts: Secondary | ICD-10-CM

## 2019-01-06 NOTE — Progress Notes (Signed)
CC: concern from [facility] that right arm AVG red and swollen, Established Dialysis Access  History of Present Illness  Roger Vazquez is a 68 y.o. (07-19-1951) male who is s/p left forearm loop AV Gore-Tex graft placed on 11-27-18 by Dr. Donnetta Hutching.  He dialyzes M-W-F via right upper chest TDC at [facility]  He returns today at the request of HD nurse, it seems, re swelling and erythema at right forearm AV graft site, and hesitancy to start accessing if the site is infected.   Pt denies fever or chills, states the swelling and redness in his right forearm is not worsening. He denies any steal type symptoms in his right hand. He denies pain.  He states that he had a previous permanent access in his left arm which became infected and had to be removed.    Past Medical History:  Diagnosis Date  . Cancer ([place])    Lung  . Diabetes mellitus   . Hypertension   . Renal disorder    MWF- [place]    Social History Social History   Tobacco Use  . Smoking status: Former Smoker    Packs/day: 0.50    Years: 10.00    Pack years: 5.00    Last attempt to quit: 2001    Years since quitting: 19.2  . Smokeless tobacco: Former Systems developer    Quit date: 08/01/2015  Substance Use Topics  . Alcohol use: Yes    Alcohol/week: 2.0 standard drinks    Types: 2 Cans of beer per week  . Drug use: Yes    Types: Marijuana    Comment: last time 11/19/2018    Family History No family history on file.  Surgical History Past Surgical History:  Procedure Laterality Date  . artiovenous graft placement Left   . AV FISTULA PLACEMENT Right 11/27/2018   Procedure: INSERTION 4-7MM X 45CM GORETEX GRAFT RIGHT FOREARM ;  Surgeon: Rosetta Posner, MD;  Location: [facility];  Service: Vascular;  Laterality: Right;  . [place] REMOVAL Left 08/06/2018   Procedure: REMOVAL OF LEFT ARM GRAFT;  Surgeon: Rosetta Posner, MD;  Location: [facility];  Service: Vascular;  Laterality: Left;  . INSERTION OF DIALYSIS CATHETER  Right 08/08/2018   Procedure: INSERTION OF TUNNELED  DIALYSIS CATHETER;  Surgeon: Marty Heck, MD;  Location: [facility];  Service: Vascular;  Laterality: Right;  . LUNG SURGERY     patient not if they removed part of lung. Was done at [place]  . TEE WITHOUT CARDIOVERSION N/A 07/31/2018   Procedure: TRANSESOPHAGEAL ECHOCARDIOGRAM (TEE);  Surgeon: Pixie Casino, MD;  Location: [facility];  Service: Cardiovascular;  Laterality: N/A;    No Known Allergies  Current Outpatient Medications  Medication Sig Dispense Refill  . aspirin 81 MG tablet Take 81 mg by mouth daily.      . blood glucose meter kit and supplies KIT Dispense based on patient and insurance preference. Use up to four times daily as directed. (FOR ICD-9 250.00, 250.01). For QAC - HS accuchecks. 1 each 1  . calcium acetate (PHOSLO) 667 MG capsule Take 667 mg by mouth 3 (three) times daily with meals.    . insulin aspart (NOVOLOG) 100 UNIT/ML injection Before each meal 3 times a day, 140-199 - 2 units, 200-250 - 4 units, 251-299 - 6 units,  300-349 - 8 units,  350 or above 10 units. Dispense syringes and needles as needed, Ok to switch to PEN if approved. Substitute  to any brand approved. DX DM2, Code E11.65 (Patient taking differently: Inject 2-8 Units into the skin See admin instructions. Before each meal 3 times a day, 140-199 - 2 units, 200-250 - 4 units, 251-299 - 6 units,  300-349 - 8 units,  350 or above 10 units. Dispense syringes and needles as needed, Ok to switch to PEN if approved. Substitute to any brand approved. DX DM2, Code E11.65) 1 vial 0  . insulin glargine (LANTUS) 100 UNIT/ML injection Inject 0.15 mLs (15 Units total) into the skin daily. Dispense insulin pen if approved, if not dispense as needed syringes and needles for 1 month supply. Can switch to Levemir. Diagnosis E 11.65. 10 mL 0  . Insulin Syringe-Needle U-100 25G X 1" 1 ML MISC For 4 times a day insulin SQ, 1 month supply. Diagnosis E11.65 30 each 0  .  Multiple Vitamin (MULITIVITAMIN WITH MINERALS) TABS Take 1 tablet by mouth daily. 30 tablet 0  . hydrALAZINE (APRESOLINE) 25 MG tablet Take 2 tablets (50 mg total) by mouth every 8 (eight) hours. (Patient not taking: Reported on 11/20/2018) 90 tablet 3  . pantoprazole (PROTONIX) 40 MG tablet Take 1 tablet (40 mg total) by mouth 2 (two) times daily. (Patient taking differently: Take 40 mg by mouth daily as needed (heartburn). ) 60 tablet 3   No current facility-administered medications for this visit.      REVIEW OF SYSTEMS: see HPI for pertinent positives and negatives    PHYSICAL EXAMINATION:  Vitals:   01/06/19 1033  BP: 108/65  Pulse: 87  Resp: 20  Temp: (!) 97.4 F (36.3 C)  TempSrc: Oral  SpO2: 99%  Weight: 135 lb (61.2 kg)  Height: 5' 6"  (1.676 m)   Body mass index is 21.79 kg/m.  General: The patient appears his stated age.   HEENT:  No gross abnormalities Pulmonary: Respirations are non-labored, good air movement in all fields, no rales, rhonchi, or wheezes. Abdomen: Soft and non-tender with normal bowel sounds. Musculoskeletal: There are no major deformities.   Neurologic: No focal weakness or paresthesias are detected, CN 2-12 grossly intact except that pt has some hearing loss.   Skin: There are no ulcer or rashes noted. Psychiatric: The patient has normal affect. Cardiovascular: There is a regular rate and rhythm without significant murmur appreciated. Right radial pulse is 1+ palpable. Right forearm AVG with palpable pulse. See photo below.     Right forearm AV graft    Medical Decision Making  Roger Vazquez is a 68 y.o. male who is s/p left forearm loop AV Gore-Tex graft on 11-27-18 by Dr. Donnetta Hutching.  He returns today at the request of HD nurse, it seems, re swelling and erythema at right forearm AV graft site, and hesitancy to start accessing if the site is infected.   Pt denies fever or chills, states the swelling and redness in his right forearm is not  worsening. He denies any steal type symptoms in his right hand. He denies pain.  He states that he had a previous permanent access in his left arm which became infected and had to be removed.     Dr. Donnetta Hutching spoke with and examined pt; Dr. Donnetta Hutching thinks that the erythema and swelling are a reaction to the Gortex graft; if given two more weeks before this is accessed, the hope is that the inflammatory process will subside, this is not an infection.  Dr. Donnetta Hutching stated that if pt [facility] were not available, that the AVG could  be accessed now, but since the [facility] is available, will wait two weeks before accessing the AVG.  Return to [facility] as needed.   Based on pt HPI and examination, this patient will return as needed.   Clemon Chambers, RN, MSN, FNP-C [facility] Office: [phone]  01/06/2019, 10:48 AM  Clinic MD: [place]

## 2019-02-17 ENCOUNTER — Other Ambulatory Visit (HOSPITAL_COMMUNITY): Payer: Self-pay | Admitting: Nephrology

## 2019-02-17 DIAGNOSIS — N186 End stage renal disease: Secondary | ICD-10-CM

## 2019-02-17 DIAGNOSIS — Z992 Dependence on renal dialysis: Principal | ICD-10-CM

## 2019-02-19 ENCOUNTER — Ambulatory Visit (HOSPITAL_COMMUNITY)
Admission: RE | Admit: 2019-02-19 | Discharge: 2019-02-19 | Disposition: A | Discharge status: Home / Self Care | Payer: Medicare PPO | Source: Ambulatory Visit | Attending: Nephrology | Admitting: Nephrology

## 2019-02-19 ENCOUNTER — Encounter (HOSPITAL_COMMUNITY): Payer: Self-pay | Admitting: Physician Assistant

## 2019-02-19 ENCOUNTER — Other Ambulatory Visit: Payer: Self-pay

## 2019-02-19 DIAGNOSIS — Z992 Dependence on renal dialysis: Secondary | ICD-10-CM | POA: Insufficient documentation

## 2019-02-19 DIAGNOSIS — N186 End stage renal disease: Secondary | ICD-10-CM | POA: Insufficient documentation

## 2019-02-19 HISTORY — PX: IR REMOVAL TUN CV CATH W/O FL: IMG2289

## 2019-02-19 MED ORDER — CHLORHEXIDINE GLUCONATE 4 % EX LIQD
CUTANEOUS | Status: AC
  Filled 2019-02-19: qty 15

## 2019-02-19 MED ORDER — CHLORHEXIDINE GLUCONATE 4 % EX LIQD: CUTANEOUS | Status: DC | PRN

## 2019-02-19 MED ORDER — LIDOCAINE HCL 1 % IJ SOLN
INTRAMUSCULAR | Status: AC
  Filled 2019-02-19: qty 20

## 2019-02-19 MED ORDER — LIDOCAINE HCL 1 % IJ SOLN
INTRAMUSCULAR | Status: DC | PRN
  Administered 2019-02-19: 5 mL

## 2019-02-19 NOTE — Procedures (Signed)
PROCEDURE SUMMARY:  Successful removal of tunneled hemodialysis catheter.  Patient tolerated well.  EBL < 5 mL  See full dictation in Imaging for details.  Ledon Weihe S Lior Hoen PA-C 02/19/2019 10:41 AM

## 2019-04-29 DIAGNOSIS — R197 Diarrhea, unspecified: Secondary | ICD-10-CM | POA: Insufficient documentation

## 2019-06-17 ENCOUNTER — Other Ambulatory Visit (HOSPITAL_COMMUNITY): Payer: Self-pay | Admitting: Family

## 2019-06-17 DIAGNOSIS — R079 Chest pain, unspecified: Secondary | ICD-10-CM

## 2019-06-25 ENCOUNTER — Ambulatory Visit (HOSPITAL_COMMUNITY): Payer: Medicare HMO | Attending: Family

## 2019-06-25 ENCOUNTER — Other Ambulatory Visit: Payer: Self-pay

## 2019-06-25 DIAGNOSIS — R079 Chest pain, unspecified: Secondary | ICD-10-CM | POA: Diagnosis not present

## 2020-05-12 ENCOUNTER — Ambulatory Visit: Payer: Medicare HMO | Admitting: Podiatry

## 2020-05-17 ENCOUNTER — Other Ambulatory Visit: Payer: Self-pay

## 2020-05-17 ENCOUNTER — Encounter: Payer: Self-pay | Admitting: Podiatry

## 2020-05-17 ENCOUNTER — Other Ambulatory Visit: Payer: Self-pay | Admitting: Podiatry

## 2020-05-17 ENCOUNTER — Ambulatory Visit: Payer: Medicare HMO | Admitting: Podiatry

## 2020-05-17 ENCOUNTER — Ambulatory Visit (INDEPENDENT_AMBULATORY_CARE_PROVIDER_SITE_OTHER): Payer: Medicare HMO

## 2020-05-17 VITALS — BP 95/56 | HR 99 | Temp 97.6°F | Resp 16

## 2020-05-17 DIAGNOSIS — I739 Peripheral vascular disease, unspecified: Secondary | ICD-10-CM

## 2020-05-17 DIAGNOSIS — E1122 Type 2 diabetes mellitus with diabetic chronic kidney disease: Secondary | ICD-10-CM | POA: Diagnosis not present

## 2020-05-17 DIAGNOSIS — N186 End stage renal disease: Secondary | ICD-10-CM

## 2020-05-17 DIAGNOSIS — L97512 Non-pressure chronic ulcer of other part of right foot with fat layer exposed: Secondary | ICD-10-CM

## 2020-05-17 DIAGNOSIS — M79674 Pain in right toe(s): Secondary | ICD-10-CM | POA: Diagnosis not present

## 2020-05-17 DIAGNOSIS — B351 Tinea unguium: Secondary | ICD-10-CM | POA: Diagnosis not present

## 2020-05-17 DIAGNOSIS — Z992 Dependence on renal dialysis: Secondary | ICD-10-CM

## 2020-05-17 MED ORDER — SANTYL 250 UNIT/GM EX OINT: 1.0000 "application " | TOPICAL_OINTMENT | Freq: Every day | CUTANEOUS | 2 refills | Status: DC

## 2020-05-17 NOTE — Progress Notes (Signed)
    Subjective:  Patient ID: Roger Vazquez, male    DOB: 03-17-1951,  MRN: [id number]  Chief Complaint  Patient presents with  . Debridement    Bilateral nail trim; pt Diabetic Type 2; Sugar=did not check today; A1C=pt stated, "7 something"-not in chart  . Wound Check    Right foot Hallux-top of toe; pt stated, "Toe feels sore; hurts a little"; x1 month    69 y.o. male presents with the above complaint. History confirmed with patient.   Objective:  Physical Exam: DP reduced right, gangrenous changes at right hallux tip and under nail bed, abnormal sensory exam and PT reduced right. Left Foot: Pulses palpable in left foot, onychomycosis x5 Right Foot: Onychomycosis x5   Assessment:   1. Pain of toe of right foot   2. 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 PAD (peripheral artery disease) ([place])   4. ESRD (end stage renal disease) ([place])   5. Type 2 diabetes mellitus with chronic kidney disease on chronic dialysis, unspecified whether long term insulin use ([place])   6. Onychomycosis      Plan:  Patient was evaluated and treated and all questions answered.  Patient educated on diabetes. Discussed proper diabetic foot care and discussed risks and complications of disease. Educated patient in depth on reasons to return to the office immediately should he/she discover anything concerning or new on the feet. All questions answered. Discussed proper shoes as well.   Discussed the etiology and treatment options for the condition in detail with the patient. Educated patient on the topical and oral treatment options for mycotic nails. Recommended debridement of the nails today. Sharp and mechanical debridement performed of all painful and mycotic nails today. Nails debrided in length and thickness using a nail nipper and a mechanical burr to level of comfort. Discussed treatment options including appropriate shoe gear. Follow up as needed for painful  nails.    Ulcer right hallux -Contributing factors: Type 2 diabetes, peripheral arterial disease -Debridement as below. -Dressed with Iodosorb and DSD. -Santyl ordered for home care, reviewed daily dressing changes -We will order vascular testing including ABI and duplex ultrasound -Continue off-loading with surgical shoe.  Procedure: Excisional Debridement of Wound Rationale: Removal of non-viable soft tissue from the wound to promote healing.  Anesthesia: none Pre-Debridement Wound Measurements: 1.0 cm x 2.0 cm x 0.1 cm  Post-Debridement Wound Measurements: 1.0 cm x 2.0 cm x 0.1 cm  Type of Debridement: Sharp Excisional Tissue Removed: Non-viable soft tissue Depth of Debridement: subcutaneous tissue. Technique: Sharp excisional debridement to bleeding, viable wound base.  Dressing: Dry, sterile, compression dressing. Disposition: Patient tolerated procedure well. Patient to return in 1 week for follow-up.        Return in about 2 weeks (around 05/31/2020).

## 2020-05-17 NOTE — Patient Instructions (Addendum)
DRESSING CHANGES RIGHT FOOT:  WEAR SURGICAL SHOE AT ALL TIMES   1. KEEP COVERED WHEN YOU SHOWER AND THEN CHANGE DRESSING IMMEDIATELY  2. CLEANSE ULCER WITH SALINE.  3. DAB DRY WITH GAUZE SPONGE.  4. APPLY A LIGHT AMOUNT OF SANTYL TO BASE OF ULCER.  5. APPLY SALINE MOISTENED GAUZE AND DRY GAUZE AFTER  6. WEAR SURGICAL SHOE DAILY AT ALL TIMES.  7. DO NOT WALK BAREFOOT!!!  8.  IF YOU EXPERIENCE ANY FEVER, CHILLS, NIGHTSWEATS, NAUSEA OR VOMITING, ELEVATED OR LOW BLOOD SUGARS, REPORT TO EMERGENCY ROOM.  9. IF YOU EXPERIENCE INCREASED REDNESS, PAIN, SWELLING, DISCOLORATION, ODOR, PUS, DRAINAGE OR WARMTH OF YOUR FOOT, REPORT TO EMERGENCY ROOM.      Diabetes Mellitus and Foot Care Foot care is an important part of your health, especially when you have diabetes. Diabetes may cause you to have problems because of poor blood flow (circulation) to your feet and legs, which can cause your skin to:  Become thinner and drier.  Break more easily.  Heal more slowly.  Peel and crack. You may also have nerve damage (neuropathy) in your legs and feet, causing decreased feeling in them. This means that you may not notice minor injuries to your feet that could lead to more serious problems. Noticing and addressing any potential problems early is the best way to prevent future foot problems. How to care for your feet Foot hygiene  Wash your feet daily with warm water and mild soap. Do not use hot water. Then, pat your feet and the areas between your toes until they are completely dry. Do not soak your feet as this can dry your skin.  Trim your toenails straight across. Do not dig under them or around the cuticle. File the edges of your nails with an emery board or nail file.  Apply a moisturizing lotion or petroleum jelly to the skin on your feet and to dry, brittle toenails. Use lotion that does not contain alcohol and is unscented. Do not apply lotion between your toes. Shoes and socks  Wear  clean socks or stockings every day. Make sure they are not too tight. Do not wear knee-high stockings since they may decrease blood flow to your legs.  Wear shoes that fit properly and have enough cushioning. Always look in your shoes before you put them on to be sure there are no objects inside.  To break in new shoes, wear them for just a few hours a day. This prevents injuries on your feet. Wounds, scrapes, corns, and calluses  Check your feet daily for blisters, cuts, bruises, sores, and redness. If you cannot see the bottom of your feet, use a mirror or ask someone for help.  Do not cut corns or calluses or try to remove them with medicine.  If you find a minor scrape, cut, or break in the skin on your feet, keep it and the skin around it clean and dry. You may clean these areas with mild soap and water. Do not clean the area with peroxide, alcohol, or iodine.  If you have a wound, scrape, corn, or callus on your foot, look at it several times a day to make sure it is healing and not infected. Check for: ? Redness, swelling, or pain. ? Fluid or blood. ? Warmth. ? Pus or a bad smell. General instructions  Do not cross your legs. This may decrease blood flow to your feet.  Do not use heating pads or hot water bottles on  your feet. They may burn your skin. If you have lost feeling in your feet or legs, you may not know this is happening until it is too late.  Protect your feet from hot and cold by wearing shoes, such as at the beach or on hot pavement.  Schedule a complete foot exam at least once a year (annually) or more often if you have foot problems. If you have foot problems, report any cuts, sores, or bruises to your health care provider immediately. Contact a health care provider if:  You have a medical condition that increases your risk of infection and you have any cuts, sores, or bruises on your feet.  You have an injury that is not healing.  You have redness on your legs  or feet.  You feel burning or tingling in your legs or feet.  You have pain or cramps in your legs and feet.  Your legs or feet are numb.  Your feet always feel cold.  You have pain around a toenail. Get help right away if:  You have a wound, scrape, corn, or callus on your foot and: ? You have pain, swelling, or redness that gets worse. ? You have fluid or blood coming from the wound, scrape, corn, or callus. ? Your wound, scrape, corn, or callus feels warm to the touch. ? You have pus or a bad smell coming from the wound, scrape, corn, or callus. ? You have a fever. ? You have a red line going up your leg. Summary  Check your feet every day for cuts, sores, red spots, swelling, and blisters.  Moisturize feet and legs daily.  Wear shoes that fit properly and have enough cushioning.  If you have foot problems, report any cuts, sores, or bruises to your health care provider immediately.  Schedule a complete foot exam at least once a year (annually) or more often if you have foot problems. This information is not intended to replace advice given to you by your health care provider. Make sure you discuss any questions you have with your health care provider. Document Revised: 06/24/2019 Document Reviewed: 11/02/2016 Elsevier Patient Education  [place].

## 2020-05-18 ENCOUNTER — Telehealth: Payer: Self-pay | Admitting: *Deleted

## 2020-05-18 ENCOUNTER — Telehealth: Payer: Self-pay | Admitting: Podiatry

## 2020-05-18 DIAGNOSIS — I739 Peripheral vascular disease, unspecified: Secondary | ICD-10-CM

## 2020-05-18 DIAGNOSIS — L97512 Non-pressure chronic ulcer of other part of right foot with fat layer exposed: Secondary | ICD-10-CM

## 2020-05-18 NOTE — Telephone Encounter (Signed)
Faxed required form, clinicals, and demographics to [facility].

## 2020-05-18 NOTE — Telephone Encounter (Signed)
-----   Message from Criselda Peaches, DPM sent at 05/17/2020  6:14 PM EDT ----- Regarding: vascular testing Hi Val,  Can you order ABI and [place] for Mr Stemm, R leg? He has PAD with an ulcer.  Thanks!

## 2020-05-18 NOTE — Telephone Encounter (Signed)
Pharmacist called and would like a call back about pts medication [phone]

## 2020-06-02 ENCOUNTER — Other Ambulatory Visit: Payer: Self-pay

## 2020-06-02 ENCOUNTER — Ambulatory Visit: Payer: Medicare HMO | Admitting: Podiatry

## 2020-06-02 DIAGNOSIS — I739 Peripheral vascular disease, unspecified: Secondary | ICD-10-CM | POA: Diagnosis not present

## 2020-06-02 DIAGNOSIS — E1122 Type 2 diabetes mellitus with diabetic chronic kidney disease: Secondary | ICD-10-CM

## 2020-06-02 DIAGNOSIS — N186 End stage renal disease: Secondary | ICD-10-CM | POA: Diagnosis not present

## 2020-06-02 DIAGNOSIS — M79674 Pain in right toe(s): Secondary | ICD-10-CM | POA: Diagnosis not present

## 2020-06-02 DIAGNOSIS — L97512 Non-pressure chronic ulcer of other part of right foot with fat layer exposed: Secondary | ICD-10-CM | POA: Diagnosis not present

## 2020-06-02 DIAGNOSIS — Z992 Dependence on renal dialysis: Secondary | ICD-10-CM

## 2020-06-02 NOTE — Progress Notes (Signed)
    Subjective:  Patient ID: Roger Vazquez, male    DOB: 1951-01-26,  MRN: [id number]  Chief Complaint  Patient presents with  . Diabetic Ulcer    2 week follow up right toe    69 y.o. male presents with the above complaint. History confirmed with patient.   Objective:  Physical Exam: DP reduced right, gangrenous changes at right hallux tip and under nail bed, abnormal sensory exam and PT reduced right. Left Foot: Pulses palpable in left foot, onychomycosis x5 Right Foot: Onychomycosis x5   Assessment:   1. 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 PAD (peripheral artery disease) ([facility])   3. Pain of toe of right foot   4. ESRD (end stage renal disease) ([place])   5. Type 2 diabetes mellitus with chronic kidney disease on chronic dialysis, unspecified whether long term insulin use ([place])      Plan:  Patient was evaluated and treated and all questions answered.  Patient educated on diabetes. Discussed proper diabetic foot care and discussed risks and complications of disease. Educated patient in depth on reasons to return to the office immediately should he/she discover anything concerning or new on the feet. All questions answered. Discussed proper shoes as well.    Ulcer right hallux -Contributing factors: Type 2 diabetes, peripheral arterial disease -No debridement performed today -Dressed with antibiotic ointment and DSD. -His arterial testing has not been performed yet I, and I gave him the contact info for the testing center and he will schedule appointment -Continue off-loading with surgical shoe.     Return in about 3 weeks (around 06/23/2020) for wound re-check.

## 2020-06-02 NOTE — Patient Instructions (Signed)
Your vascular testing referral has been sent to:  [facility] [address] [place], [place] [address]  [phone]  Call the number above to confirm your testing appointment, if you have any issues please call our office

## 2020-06-23 ENCOUNTER — Ambulatory Visit: Payer: Medicare HMO | Admitting: Podiatry

## 2020-06-29 ENCOUNTER — Telehealth (HOSPITAL_COMMUNITY): Payer: Self-pay

## 2020-06-29 NOTE — Telephone Encounter (Signed)

## 2020-06-30 ENCOUNTER — Inpatient Hospital Stay (HOSPITAL_COMMUNITY): Admission: RE | Admit: 2020-06-30 | Payer: Medicare HMO | Source: Ambulatory Visit

## 2020-06-30 ENCOUNTER — Encounter (HOSPITAL_COMMUNITY): Payer: Medicare HMO

## 2020-07-05 ENCOUNTER — Other Ambulatory Visit: Payer: Self-pay

## 2020-07-05 ENCOUNTER — Ambulatory Visit (HOSPITAL_COMMUNITY)
Admission: RE | Admit: 2020-07-05 | Discharge: 2020-07-05 | Disposition: A | Discharge status: Home / Self Care | Payer: Medicare HMO | Source: Ambulatory Visit | Attending: Podiatry | Admitting: Podiatry

## 2020-07-05 ENCOUNTER — Ambulatory Visit (INDEPENDENT_AMBULATORY_CARE_PROVIDER_SITE_OTHER)
Admission: RE | Admit: 2020-07-05 | Discharge: 2020-07-05 | Disposition: A | Discharge status: Home / Self Care | Payer: Medicare HMO | Source: Ambulatory Visit | Attending: Podiatry | Admitting: Podiatry

## 2020-07-05 DIAGNOSIS — I739 Peripheral vascular disease, unspecified: Secondary | ICD-10-CM

## 2020-07-05 DIAGNOSIS — L97512 Non-pressure chronic ulcer of other part of right foot with fat layer exposed: Secondary | ICD-10-CM

## 2021-04-27 ENCOUNTER — Encounter (HOSPITAL_COMMUNITY): Payer: Self-pay | Admitting: Emergency Medicine

## 2021-04-27 ENCOUNTER — Emergency Department (HOSPITAL_COMMUNITY): Payer: Medicare HMO

## 2021-04-27 ENCOUNTER — Inpatient Hospital Stay (HOSPITAL_COMMUNITY)
Admission: EM | Admit: 2021-04-27 | Discharge: 2021-05-05 | DRG: 239 | Disposition: A | Discharge status: Home / Self Care | Payer: Medicare HMO | Attending: Family Medicine | Admitting: Family Medicine

## 2021-04-27 ENCOUNTER — Observation Stay (HOSPITAL_BASED_OUTPATIENT_CLINIC_OR_DEPARTMENT_OTHER): Payer: Medicare HMO

## 2021-04-27 ENCOUNTER — Encounter (HOSPITAL_COMMUNITY): Payer: Self-pay

## 2021-04-27 ENCOUNTER — Ambulatory Visit (HOSPITAL_COMMUNITY)
Admission: EM | Admit: 2021-04-27 | Discharge: 2021-04-27 | Disposition: A | Discharge status: Other Acute Inpatient Hospital | Payer: Medicare HMO | Attending: Emergency Medicine | Admitting: Emergency Medicine

## 2021-04-27 ENCOUNTER — Other Ambulatory Visit: Payer: Self-pay

## 2021-04-27 DIAGNOSIS — Z992 Dependence on renal dialysis: Secondary | ICD-10-CM

## 2021-04-27 DIAGNOSIS — E1122 Type 2 diabetes mellitus with diabetic chronic kidney disease: Secondary | ICD-10-CM | POA: Diagnosis present

## 2021-04-27 DIAGNOSIS — I70229 Atherosclerosis of native arteries of extremities with rest pain, unspecified extremity: Secondary | ICD-10-CM

## 2021-04-27 DIAGNOSIS — I96 Gangrene, not elsewhere classified: Secondary | ICD-10-CM | POA: Diagnosis not present

## 2021-04-27 DIAGNOSIS — L089 Local infection of the skin and subcutaneous tissue, unspecified: Secondary | ICD-10-CM

## 2021-04-27 DIAGNOSIS — Z6821 Body mass index (BMI) 21.0-21.9, adult: Secondary | ICD-10-CM

## 2021-04-27 DIAGNOSIS — I739 Peripheral vascular disease, unspecified: Secondary | ICD-10-CM | POA: Diagnosis not present

## 2021-04-27 DIAGNOSIS — Z7982 Long term (current) use of aspirin: Secondary | ICD-10-CM

## 2021-04-27 DIAGNOSIS — Z87891 Personal history of nicotine dependence: Secondary | ICD-10-CM

## 2021-04-27 DIAGNOSIS — A419 Sepsis, unspecified organism: Secondary | ICD-10-CM

## 2021-04-27 DIAGNOSIS — Z833 Family history of diabetes mellitus: Secondary | ICD-10-CM

## 2021-04-27 DIAGNOSIS — Z794 Long term (current) use of insulin: Secondary | ICD-10-CM

## 2021-04-27 DIAGNOSIS — N186 End stage renal disease: Secondary | ICD-10-CM | POA: Diagnosis present

## 2021-04-27 DIAGNOSIS — L03116 Cellulitis of left lower limb: Secondary | ICD-10-CM | POA: Diagnosis present

## 2021-04-27 DIAGNOSIS — E1152 Type 2 diabetes mellitus with diabetic peripheral angiopathy with gangrene: Secondary | ICD-10-CM | POA: Diagnosis not present

## 2021-04-27 DIAGNOSIS — Z888 Allergy status to other drugs, medicaments and biological substances status: Secondary | ICD-10-CM

## 2021-04-27 DIAGNOSIS — L97529 Non-pressure chronic ulcer of other part of left foot with unspecified severity: Secondary | ICD-10-CM | POA: Diagnosis present

## 2021-04-27 DIAGNOSIS — B182 Chronic viral hepatitis C: Secondary | ICD-10-CM | POA: Diagnosis present

## 2021-04-27 DIAGNOSIS — B1920 Unspecified viral hepatitis C without hepatic coma: Secondary | ICD-10-CM | POA: Diagnosis present

## 2021-04-27 DIAGNOSIS — F1290 Cannabis use, unspecified, uncomplicated: Secondary | ICD-10-CM | POA: Diagnosis present

## 2021-04-27 DIAGNOSIS — Z79899 Other long term (current) drug therapy: Secondary | ICD-10-CM

## 2021-04-27 DIAGNOSIS — D631 Anemia in chronic kidney disease: Secondary | ICD-10-CM | POA: Diagnosis present

## 2021-04-27 DIAGNOSIS — Z20822 Contact with and (suspected) exposure to covid-19: Secondary | ICD-10-CM | POA: Diagnosis present

## 2021-04-27 DIAGNOSIS — K219 Gastro-esophageal reflux disease without esophagitis: Secondary | ICD-10-CM | POA: Diagnosis present

## 2021-04-27 DIAGNOSIS — K5900 Constipation, unspecified: Secondary | ICD-10-CM | POA: Diagnosis not present

## 2021-04-27 DIAGNOSIS — I70262 Atherosclerosis of native arteries of extremities with gangrene, left leg: Secondary | ICD-10-CM | POA: Diagnosis present

## 2021-04-27 DIAGNOSIS — E11621 Type 2 diabetes mellitus with foot ulcer: Secondary | ICD-10-CM | POA: Diagnosis present

## 2021-04-27 DIAGNOSIS — R54 Age-related physical debility: Secondary | ICD-10-CM | POA: Diagnosis present

## 2021-04-27 DIAGNOSIS — I440 Atrioventricular block, first degree: Secondary | ICD-10-CM | POA: Diagnosis present

## 2021-04-27 DIAGNOSIS — N2581 Secondary hyperparathyroidism of renal origin: Secondary | ICD-10-CM | POA: Diagnosis present

## 2021-04-27 DIAGNOSIS — F1010 Alcohol abuse, uncomplicated: Secondary | ICD-10-CM | POA: Diagnosis present

## 2021-04-27 DIAGNOSIS — I120 Hypertensive chronic kidney disease with stage 5 chronic kidney disease or end stage renal disease: Secondary | ICD-10-CM | POA: Diagnosis present

## 2021-04-27 DIAGNOSIS — Z85118 Personal history of other malignant neoplasm of bronchus and lung: Secondary | ICD-10-CM

## 2021-04-27 DIAGNOSIS — H9190 Unspecified hearing loss, unspecified ear: Secondary | ICD-10-CM | POA: Diagnosis present

## 2021-04-27 LAB — COMPREHENSIVE METABOLIC PANEL
ALT: 9 U/L (ref 0–44)
AST: 10 U/L — ABNORMAL LOW (ref 15–41)
Albumin: 3.6 g/dL (ref 3.5–5.0)
Alkaline Phosphatase: 88 U/L (ref 38–126)
Anion gap: 13 (ref 5–15)
BUN: 23 mg/dL (ref 8–23)
CO2: 29 mmol/L (ref 22–32)
Calcium: 8.9 mg/dL (ref 8.9–10.3)
Chloride: 95 mmol/L — ABNORMAL LOW (ref 98–111)
Creatinine, Ser: 7.45 mg/dL — ABNORMAL HIGH (ref 0.61–1.24)
GFR, Estimated: 7 mL/min — ABNORMAL LOW (ref >60)
Glucose, Bld: 206 mg/dL — ABNORMAL HIGH (ref 70–99)
Potassium: 3.7 mmol/L (ref 3.5–5.1)
Sodium: 137 mmol/L (ref 135–145)
Total Bilirubin: 0.4 mg/dL (ref 0.3–1.2)
Total Protein: 8.5 g/dL — ABNORMAL HIGH (ref 6.5–8.1)

## 2021-04-27 LAB — CBC WITH DIFFERENTIAL/PLATELET
Abs Immature Granulocytes: 0.06 10*3/uL (ref 0.00–0.07)
Basophils Absolute: 0 10*3/uL (ref 0.0–0.1)
Basophils Relative: 0 %
Eosinophils Absolute: 0.1 10*3/uL (ref 0.0–0.5)
Eosinophils Relative: 1 %
HCT: 34.4 % — ABNORMAL LOW (ref 39.0–52.0)
Hemoglobin: 11.3 g/dL — ABNORMAL LOW (ref 13.0–17.0)
Immature Granulocytes: 1 %
Lymphocytes Relative: 16 %
Lymphs Abs: 1.4 10*3/uL (ref 0.7–4.0)
MCH: 35.1 pg — ABNORMAL HIGH (ref 26.0–34.0)
MCHC: 32.8 g/dL (ref 30.0–36.0)
MCV: 106.8 fL — ABNORMAL HIGH (ref 80.0–100.0)
Monocytes Absolute: 0.7 10*3/uL (ref 0.1–1.0)
Monocytes Relative: 7 %
Neutro Abs: 6.7 10*3/uL (ref 1.7–7.7)
Neutrophils Relative %: 75 %
Platelets: 295 10*3/uL (ref 150–400)
RBC: 3.22 MIL/uL — ABNORMAL LOW (ref 4.22–5.81)
RDW: 13.6 % (ref 11.5–15.5)
WBC: 8.9 10*3/uL (ref 4.0–10.5)
nRBC: 0 % (ref 0.0–0.2)

## 2021-04-27 LAB — RESP PANEL BY RT-PCR (FLU A&B, COVID) ARPGX2
Influenza A by PCR: NEGATIVE
Influenza B by PCR: NEGATIVE
SARS Coronavirus 2 by RT PCR: NEGATIVE

## 2021-04-27 LAB — URINALYSIS, ROUTINE W REFLEX MICROSCOPIC
Bacteria, UA: NONE SEEN
Bilirubin Urine: NEGATIVE
Glucose, UA: 50 mg/dL — AB
Hgb urine dipstick: NEGATIVE
Ketones, ur: NEGATIVE mg/dL
Nitrite: NEGATIVE
Protein, ur: 100 mg/dL — AB
Specific Gravity, Urine: 1.012 (ref 1.005–1.030)
WBC, UA: >50 WBC/hpf — ABNORMAL HIGH (ref 0–5)
pH: 8 (ref 5.0–8.0)

## 2021-04-27 LAB — GLUCOSE, CAPILLARY
Glucose-Capillary: 96 mg/dL (ref 70–99)
Glucose-Capillary: 118 mg/dL — ABNORMAL HIGH (ref 70–99)

## 2021-04-27 LAB — LACTIC ACID, PLASMA: Lactic Acid, Venous: 1.4 mmol/L (ref 0.5–1.9)

## 2021-04-27 LAB — PROTIME-INR
INR: 1 (ref 0.8–1.2)
Prothrombin Time: 12.9 seconds (ref 11.4–15.2)

## 2021-04-27 LAB — CBG MONITORING, ED: Glucose-Capillary: 206 mg/dL — ABNORMAL HIGH (ref 70–99)

## 2021-04-27 LAB — HEMOGLOBIN A1C
Hgb A1c MFr Bld: 7.9 % — ABNORMAL HIGH (ref 4.8–5.6)
Mean Plasma Glucose: 180.03 mg/dL

## 2021-04-27 MED ORDER — CHLORHEXIDINE GLUCONATE CLOTH 2 % EX PADS
6.0000 | MEDICATED_PAD | Freq: Every day | CUTANEOUS | Status: DC
  Administered 2021-04-28 – 2021-04-30 (×3): 6 via TOPICAL

## 2021-04-27 MED ORDER — SODIUM CHLORIDE 0.9 % IV SOLN
2.0000 g | Freq: Once | INTRAVENOUS | Status: AC
  Administered 2021-04-27: 2 g via INTRAVENOUS
  Filled 2021-04-27: qty 2

## 2021-04-27 MED ORDER — VANCOMYCIN HCL 1250 MG/250ML IV SOLN
1250.0000 mg | Freq: Once | INTRAVENOUS | Status: AC
  Administered 2021-04-27: 1250 mg via INTRAVENOUS
  Filled 2021-04-27: qty 250

## 2021-04-27 MED ORDER — INSULIN GLARGINE 100 UNIT/ML ~~LOC~~ SOLN: 15.0000 [IU] | Freq: Every day | SUBCUTANEOUS | Status: DC

## 2021-04-27 MED ORDER — INSULIN GLARGINE 100 UNIT/ML ~~LOC~~ SOLN: 15.0000 [IU] | Freq: Every morning | SUBCUTANEOUS | Status: DC

## 2021-04-27 MED ORDER — INSULIN GLARGINE 100 UNIT/ML ~~LOC~~ SOLN
15.0000 [IU] | Freq: Every day | SUBCUTANEOUS | Status: DC
  Filled 2021-04-27: qty 0.15

## 2021-04-27 MED ORDER — HEPARIN SODIUM (PORCINE) 5000 UNIT/ML IJ SOLN
5000.0000 [IU] | Freq: Three times a day (TID) | INTRAMUSCULAR | Status: DC
  Administered 2021-04-27 (×2): 5000 [IU] via SUBCUTANEOUS
  Filled 2021-04-27 (×2): qty 1

## 2021-04-27 MED ORDER — ASPIRIN EC 81 MG PO TBEC: 81.0000 mg | DELAYED_RELEASE_TABLET | Freq: Every day | ORAL | Status: DC

## 2021-04-27 MED ORDER — HYDROMORPHONE HCL 2 MG PO TABS
1.0000 mg | ORAL_TABLET | ORAL | Status: DC | PRN
  Administered 2021-04-27 – 2021-04-28 (×3): 1 mg via ORAL
  Filled 2021-04-27 (×3): qty 1

## 2021-04-27 MED ORDER — VANCOMYCIN HCL IN DEXTROSE 750-5 MG/150ML-% IV SOLN
750.0000 mg | INTRAVENOUS | Status: DC
  Filled 2021-04-27 (×2): qty 150

## 2021-04-27 MED ORDER — HYDRALAZINE HCL 50 MG PO TABS
50.0000 mg | ORAL_TABLET | Freq: Three times a day (TID) | ORAL | Status: DC
  Administered 2021-04-27 – 2021-05-05 (×23): 50 mg via ORAL
  Filled 2021-04-27 (×23): qty 1

## 2021-04-27 MED ORDER — SODIUM CHLORIDE 0.9 % IV SOLN
1.0000 g | INTRAVENOUS | Status: DC
  Filled 2021-04-27 (×2): qty 1

## 2021-04-27 MED ORDER — CALCIUM ACETATE (PHOS BINDER) 667 MG PO CAPS
667.0000 mg | ORAL_CAPSULE | Freq: Three times a day (TID) | ORAL | Status: DC
  Administered 2021-04-27 – 2021-05-05 (×18): 667 mg via ORAL
  Filled 2021-04-27 (×19): qty 1

## 2021-04-27 MED ORDER — LACTATED RINGERS IV SOLN: INTRAVENOUS | Status: DC

## 2021-04-27 MED ORDER — ACETAMINOPHEN 500 MG PO TABS
500.0000 mg | ORAL_TABLET | Freq: Four times a day (QID) | ORAL | Status: DC
  Administered 2021-04-27 – 2021-05-05 (×26): 500 mg via ORAL
  Filled 2021-04-27 (×26): qty 1

## 2021-04-27 MED ORDER — INSULIN ASPART 100 UNIT/ML IJ SOLN
0.0000 [IU] | Freq: Three times a day (TID) | INTRAMUSCULAR | Status: DC
  Administered 2021-04-29: 1 [IU] via SUBCUTANEOUS
  Administered 2021-04-30 (×2): 2 [IU] via SUBCUTANEOUS
  Administered 2021-05-01: 1 [IU] via SUBCUTANEOUS
  Administered 2021-05-01: 2 [IU] via SUBCUTANEOUS
  Administered 2021-05-02 (×2): 1 [IU] via SUBCUTANEOUS
  Administered 2021-05-02: 2 [IU] via SUBCUTANEOUS
  Administered 2021-05-03 – 2021-05-04 (×2): 3 [IU] via SUBCUTANEOUS
  Administered 2021-05-05: 1 [IU] via SUBCUTANEOUS

## 2021-04-27 MED ORDER — OXYCODONE-ACETAMINOPHEN 5-325 MG PO TABS
1.0000 | ORAL_TABLET | Freq: Once | ORAL | Status: AC
  Administered 2021-04-27: 1 via ORAL
  Filled 2021-04-27: qty 1

## 2021-04-27 MED ORDER — ACETAMINOPHEN 500 MG PO TABS
500.0000 mg | ORAL_TABLET | Freq: Once | ORAL | Status: AC
  Administered 2021-04-27: 500 mg via ORAL
  Filled 2021-04-27: qty 1

## 2021-04-27 NOTE — ED Triage Notes (Signed)
Pt sent from [facility] for further evaluation of left 2nd toe infection. Pt reports gradual worsening of infection for the past few months. Hx of diabetes and dialysis (MWF, went yesterday). Toe is gangrene and malodorous with drainage. Pt a.o, febrile in triage

## 2021-04-27 NOTE — Discharge Instructions (Addendum)
Go immediately to the emergency department.  You will need IV antibiotics, imaging, and most likely admission to the hospital.  This is very serious.  Please do not leave until your ER evaluation is complete.

## 2021-04-27 NOTE — H&P (Addendum)
[facility] Admission History and Physical Service Pager: [phone]  Patient name: Roger Vazquez Medical record number: [id number] Date of birth: 23-Nov-1950 Age: 70 y.o. Gender: male  Primary Care Provider: [facility] Consultants: Vascular, nephro Code Status: Full code Preferred Emergency Contact:  Contact Information     Name Relation Home Work Mobile   Dickard,Linda Sister [phone]          Chief Complaint: Toe pain with discoloration  Assessment and Plan: Roger Vazquez is a 70 y.o. male presenting with gangrene of left second toe, pain in left great toe and surrounding cellulitis. PMH is significant for DMT2, HTN, hepatitis C, lung cancer, ESRD, and has dialysis on MWF.  Gangrenous toe with drainage and surrounding cellulitis Patient started having "some issues" with his left big toe a few months ago after having his nails clipped.  He states his left great and second toe pain had worsened significantly in the past few days.  Patient noted that his left second toe started turning black a few days ago. X-ray of the foot shows mid-distal soft tissues swelling of the left foot with no specific evidence for osteomyelitis.  However, with the significant necrosis present on the second digit I would not be surprised if he had osteomyelitis present.  Code sepsis was initially called for fever and tachycardia though the highest temperature documented is 100.38F. Temperature on admission was 100.2 and he was tachycardic. He denies any fevers prior to today.   Blood pressure was elevated at 166/116. WBC and lactic acid WNL, blood culture ordered. He rates his pain at a 7/10.  The case was discussed with vascular in the emergency department due to poorly palpable peripheral pulses.  Vascular plans to see the patient later today to determine if a procedure from their standpoint may be in order or if it would be better handled by podiatry or  orthopedics. -Admit to med telemetry, attending Dr. Ardelia Mems -Vascular consult, appreciate recommendations -Continue vancomycin -Continue cefepime -Daily CBC -CMP tomorrow morning -Tylenol q6h -1 mg dilaudid q4h prn -Cardiac monitoring -N.p.o. pending vascular evaluation this afternoon  ESRD on HD Patient receives dialysis every MWF.  He last received dialysis on 7/13. Takes phoslo. On admission creatinine is 7.45 mg GFR is 7.  Per nephrology will plan for HD tomorrow -Nephrology on board, appreciate their assistance -Avoid nephrotoxic agents -Continue Dialysis inpatient  -Continue Phoslo  Type 2 diabetes BG on admission is 206.  Patient takes 24 units Lantus at home in the mornings, states he does not always take it.  He does not take insulin after meals.  There is a possibility of a procedure later today or tomorrow so will reduce patient's Lantus to 15 units while n.p.o. starting tomorrow morning. -SSI - we will hold Lantus in the morning while he is n.p.o. -CBG monitoring -A1c ordered  HTN Blood pressure on admission is 166/116. Pt takes hydralazine at home. -Continue 50 mg Hydralazine  History of hepatitis C Patient states he thinks he was treated for hepatitis C sometime ago but does not know if he completed the treatment or if there was any other work-up done or needed. First noted in chart as far back as 2008. Some visit notes from ID around 2009, however because of chart conversion unable to view those records. May still have active chronic Hep C; chart review demonstrates Hep C labs in 10/15/2019of HCV quant 1,300,000 and HCV quant log 6.114. -HCV RNA quant ordered  Prolonged QTC  QTC on initial EKG of 564. -Avoid QT prolonging agents -AM EKG  First-degree heart block: Noted on EKG with PR interval of 278.  Patient asymptomatic.  Per review appears chronic. -We will continue to monitor  History of alcohol and polysubstance abuse documented in chart Currently  states he only drinks about half pint of wine every 3 weeks.  Denies illicit drugs other than marijuana. -We will monitor CIWA's  History of Lung Cancer Pt smoked 1/2 ppd for about 13 years and quit when he found out he had lung cancer about 5 years ago.   He states he surgery to remove his cancer about 5 years ago and has not had a reoccurrence. Chest x-ray performed in the emergency department with no acute cardiopulmonary disease but did show scarring and or atelectasis in the left lung base.  FEN/GI: NPO pending vascular eval Prophylaxis: Heparin  Disposition: Med- Tele  History of Present Illness:  Roger Vazquez is a 70 y.o. male presenting with left second toe pain.   He says he has had some "issues" with the toe for a few months and over the last few days to week has started to turn black. He has not yet seen any physicians about the toe before today.   He denies chills, states he has had headaches that last of which was 2-3 days ago. He says these are more like a tightness or pressure and only started over the last week or so but says he hasn't been eating much over that time period due to his dentures.   Patient states he normally takes 24u lantus but doesn't always take it.  He takes it in the morning each day.  Also takes Phoslo and hydralazine. Does not take any insulin after meals.  Tobacco - Former smoker - used to smoke 1/2ppd for about 13 years. Quit about 5 years ago when found out he had lung cancer.  Alcohol - 1/2 pint of wine every 3 weeks Marijuana daily, no orher illicit drugs  Has history of lung cancer and said they performed surgery about 5 years ago and he hasn't had any recurrence since then.  Has history of Hep C but thinks he completed a treatment. Gets HD on MWF.  Review Of Systems: Per HPI with the following additions:   Review of Systems  Constitutional:  Negative for chills and fever.  Eyes:  Negative for visual disturbance.  Respiratory:  Positive  for shortness of breath (chronic when ambulating).   Cardiovascular:  Negative for chest pain.  Gastrointestinal:  Negative for nausea and vomiting.  Genitourinary:  Negative for difficulty urinating.  Musculoskeletal:  Positive for joint swelling.  Skin:  Positive for wound.  Neurological:  Positive for headaches.    Patient Active Problem List   Diagnosis Date Noted   Gangrene ([place]) 04/27/2021   Fever    Infection of AV graft for dialysis ([place]) 08/05/2018   Symptomatic anemia 08/05/2018   Hypokalemia 08/05/2018   GERD (gastroesophageal reflux disease) 08/05/2018   ESRD (end stage renal disease) ([place])    Bacteremia due to methicillin susceptible Staphylococcus aureus (MSSA) 07/28/2018   Sepsis ([place]) 07/27/2018   Abdominal pain, other specified site 10/07/2011   Nausea and vomiting 10/07/2011   VITAMIN D DEFICIENCY 03/19/2008   FRACTURE, TIBIA 02/09/2008   HTN (hypertension) 11/25/2007   HEPATITIS C 05/27/2007   Type 2 diabetes mellitus with chronic kidney disease on chronic dialysis ([place]) 05/27/2007   ERECTILE DYSFUNCTION 05/27/2007   ABUSE, ALCOHOL,  IN REMISSION 05/27/2007   ABUSE, OPIOID, IN REMISSION 05/27/2007   ABUSE, COCAINE, IN REMISSION 05/27/2007    Past Medical History: Past Medical History:  Diagnosis Date   Cancer ([place])    Lung   Diabetes mellitus    Hypertension    Renal disorder    MWF- [facility]    Past Surgical History: Past Surgical History:  Procedure Laterality Date   artiovenous graft placement Left    AV FISTULA PLACEMENT Right 11/27/2018   Procedure: INSERTION 4-7MM X 45CM GORETEX GRAFT RIGHT FOREARM ;  Surgeon: Rosetta Posner, MD;  Location: [place] [facility];  Service: Vascular;  Laterality: Right;   [place] REMOVAL Left 08/06/2018   Procedure: REMOVAL OF LEFT ARM GRAFT;  Surgeon: Rosetta Posner, MD;  Location: [place];  Service: Vascular;  Laterality: Left;   INSERTION OF DIALYSIS CATHETER Right 08/08/2018   Procedure: INSERTION OF TUNNELED  DIALYSIS  CATHETER;  Surgeon: Marty Heck, MD;  Location: [place] [facility];  Service: Vascular;  Laterality: Right;   IR REMOVAL TUN CV CATH W/O FL  02/19/2019   LUNG SURGERY     patient not if they removed part of lung. Was done at [facility] 07/31/2018   Procedure: TRANSESOPHAGEAL ECHOCARDIOGRAM (TEE);  Surgeon: Pixie Casino, MD;  Location: [facility] ENDOSCOPY;  Service: Cardiovascular;  Laterality: N/A;    Social History: Social History   Tobacco Use   Smoking status: Former    Packs/day: 0.50    Years: 10.00    Pack years: 5.00    Types: Cigarettes    Quit date: 2001    Years since quitting: 21.5   Smokeless tobacco: Former    Quit date: 08/01/2015  Vaping Use   Vaping Use: Never used  Substance Use Topics   Alcohol use: Yes    Alcohol/week: 2.0 standard drinks    Types: 2 Cans of beer per week   Drug use: Yes    Types: Marijuana    Comment: last time 11/19/2018     Family History: Father- DM Mother-Cancer  Allergies and Medications: Allergies  Allergen Reactions   Sildenafil Other (See Comments)    Pt stated, "Makes my Blood Pressure drop"   No current facility-administered medications on file prior to encounter.   Current Outpatient Medications on File Prior to Encounter  Medication Sig Dispense Refill   aspirin 81 MG tablet Take 81 mg by mouth daily.       calcium acetate (PHOSLO) 667 MG capsule Take 667 mg by mouth 3 (three) times daily with meals.     calcium carbonate (OS-CAL) 1250 (500 Ca) MG chewable tablet Chew 1 tablet by mouth daily as needed for heartburn.     insulin aspart (NOVOLOG) 100 UNIT/ML injection Before each meal 3 times a day, 140-199 - 2 units, 200-250 - 4 units, 251-299 - 6 units,  300-349 - 8 units,  350 or above 10 units. Dispense syringes and needles as needed, Ok to switch to PEN if approved. Substitute to any brand approved. DX DM2, Code E11.65 (Patient taking differently: Inject 24 Units into the skin daily.) 1 vial 0    insulin glargine (LANTUS) 100 UNIT/ML injection Inject 0.15 mLs (15 Units total) into the skin daily. Dispense insulin pen if approved, if not dispense as needed syringes and needles for 1 month supply. Can switch to Levemir. Diagnosis E 11.65. 10 mL 0   Insulin Syringe-Needle U-100 25G X 1" 1 ML MISC For 4 times  a day insulin SQ, 1 month supply. Diagnosis E11.65 30 each 0   Multiple Vitamin (MULITIVITAMIN WITH MINERALS) TABS Take 1 tablet by mouth daily. 30 tablet 0   blood glucose meter kit and supplies KIT Dispense based on patient and insurance preference. Use up to four times daily as directed. (FOR ICD-9 250.00, 250.01). For QAC - HS accuchecks. 1 each 1   hydrALAZINE (APRESOLINE) 25 MG tablet Take 2 tablets (50 mg total) by mouth every 8 (eight) hours. (Patient not taking: Reported on 11/20/2018) 90 tablet 3    Objective: BP (!) 166/116 (BP Location: Right Arm)   Pulse (!) 59   Temp 100.2 F (37.9 C) (Oral)   Resp 15   Ht _0  (1.676 m)   Wt 61.7 kg   SpO2 93%   BMI 21.95 kg/m  Exam: General: Alert, in no acute distress Eyes: PERRL ENTM: Moist mucous membranes, no erythema or exudate of oropharynx Cardiovascular: Regular rate, occasionally sounded irregular in rhythm Respiratory: Some and expiratory wheezing present in all lung fields Gastrointestinal: Soft, normal bowel sounds, nondistended MSK: Good bulk and tone Neuro: Alert, oriented x3 Psych: Affect appropriate for situation Extremities: Left lower extremity with significant tenderness to palpation of the great toe on the distal aspect.  Patient with necrosis with drainage present of the second toe.  Some pain with palpation of the third and fourth toe as well on the left foot.  Peripheral pulses are palpable mildly in the right foot but difficult to palpate in the left. Photo per ED provider:    Labs and Imaging: CBC BMET  Recent Labs  Lab 04/27/21 1229  WBC 8.9  HGB 11.3*  HCT 34.4*  PLT 295   Recent Labs  Lab  04/27/21 1229  NA 137  K 3.7  CL 95*  CO2 29  BUN 23  CREATININE 7.45*  GLUCOSE 206*  CALCIUM 8.9      Precious Gilding, DO 04/27/2021, 3:10 PM PGY-1, [place] Intern pager: [phone], text pages welcome   Upper Level Addendum:  I have seen and evaluated this patient along with Dr. Ronnald Ramp and reviewed the above note, making necessary revisions as appropriate in green.  I agree with the medical decision making and physical exam as noted above.  Lurline Del, DO PGY-3 [facility]

## 2021-04-27 NOTE — ED Notes (Signed)
Dr Alphonzo Cruise at bed side to eval PT.

## 2021-04-27 NOTE — ED Notes (Signed)
Attempted to call report

## 2021-04-27 NOTE — ED Provider Notes (Signed)
[facility] EMERGENCY DEPARTMENT Provider Note   CSN: [id number] Arrival date & time: 04/27/21  1208    History Chief Complaint  Roger Vazquez presents with   Wound Infection    Roger Vazquez is a 70 y.o. male with past medical history significant for diabetes, hypertension, ESRD, recurrent MRSA bacteremia who presents for evaluation of toe infection.  Roger Vazquez states his left lower extremity has hurt for a very long time.  Stated last week his second digit turned black.  He is also noted a wound to his first and third digit.  He has had some chills has not taken his temperature.  Rates his pain a 9/10.  Seen at urgent care sent here for further evaluation.  Last dialysis session yesterday without complications. Malodorous drainage.  States he has decreased sensation in his bilateral lower extremities at baseline from his diabetes.  Has never had any amputations previously.  He denies headache, chest pain, shortness of breath, abdominal pain, lower extremity edema.  Denies additional aggravating or alleviating factors.  No complications with his right upper extremity fistula.  History obtained from Roger Vazquez and past medical records.  No interpreter used.  [facility] [place] Unsure of name of facility  HPI     Past Medical History:  Diagnosis Date   Cancer ([place])    Lung   Diabetes mellitus    Hypertension    Renal disorder    MWF- [facility]    Roger Vazquez Active Problem List   Diagnosis Date Noted   Gangrene ([place]) 04/27/2021   Fever    Infection of AV graft for dialysis ([place]) 08/05/2018   Symptomatic anemia 08/05/2018   Hypokalemia 08/05/2018   GERD (gastroesophageal reflux disease) 08/05/2018   ESRD (end stage renal disease) ([place])    Bacteremia due to methicillin susceptible Staphylococcus aureus (MSSA) 07/28/2018   Sepsis ([place]) 07/27/2018   Abdominal pain, other specified site 10/07/2011   Nausea and vomiting 10/07/2011    VITAMIN D DEFICIENCY 03/19/2008   FRACTURE, TIBIA 02/09/2008   HTN (hypertension) 11/25/2007   HEPATITIS C 05/27/2007   Type 2 diabetes mellitus with chronic kidney disease on chronic dialysis ([place]) 05/27/2007   ERECTILE DYSFUNCTION 05/27/2007   ABUSE, ALCOHOL, IN REMISSION 05/27/2007   ABUSE, OPIOID, IN REMISSION 05/27/2007   ABUSE, COCAINE, IN REMISSION 05/27/2007    Past Surgical History:  Procedure Laterality Date   artiovenous graft placement Left    AV FISTULA PLACEMENT Right 11/27/2018   Procedure: INSERTION 4-7MM X 45CM GORETEX GRAFT RIGHT FOREARM ;  Surgeon: Rosetta Posner, MD;  Location: [facility];  Service: Vascular;  Laterality: Right;   [place] REMOVAL Left 08/06/2018   Procedure: REMOVAL OF LEFT ARM GRAFT;  Surgeon: Rosetta Posner, MD;  Location: [facility];  Service: Vascular;  Laterality: Left;   INSERTION OF DIALYSIS CATHETER Right 08/08/2018   Procedure: INSERTION OF TUNNELED  DIALYSIS CATHETER;  Surgeon: Marty Heck, MD;  Location: [facility];  Service: Vascular;  Laterality: Right;   IR REMOVAL TUN CV CATH W/O FL  02/19/2019   LUNG SURGERY     Roger Vazquez not if they removed part of lung. Was done at [facility] 07/31/2018   Procedure: TRANSESOPHAGEAL ECHOCARDIOGRAM (TEE);  Surgeon: Pixie Casino, MD;  Location: [facility] ENDOSCOPY;  Service: Cardiovascular;  Laterality: N/A;       No family history on file.  Social History   Tobacco Use   Smoking status: Former  Packs/day: 0.50    Years: 10.00    Pack years: 5.00    Types: Cigarettes    Quit date: 2001    Years since quitting: 21.5   Smokeless tobacco: Former    Quit date: 08/01/2015  Vaping Use   Vaping Use: Never used  Substance Use Topics   Alcohol use: Yes    Alcohol/week: 2.0 standard drinks    Types: 2 Cans of beer per week   Drug use: Yes    Types: Marijuana    Comment: last time 11/19/2018    Home Medications Prior to Admission medications   Medication Sig Start Date End Date  Taking? Authorizing Provider  aspirin 81 MG tablet Take 81 mg by mouth daily.     Yes [provider]  calcium acetate (PHOSLO) 667 MG capsule Take 667 mg by mouth 3 (three) times daily with meals.   Yes [provider]  calcium carbonate (OS-CAL) 1250 (500 Ca) MG chewable tablet Chew 1 tablet by mouth daily as needed for heartburn. 12/06/16  Yes [provider]  insulin aspart (NOVOLOG) 100 UNIT/ML injection Before each meal 3 times a day, 140-199 - 2 units, 200-250 - 4 units, 251-299 - 6 units,  300-349 - 8 units,  350 or above 10 units. Dispense syringes and needles as needed, Ok to switch to PEN if approved. Substitute to any brand approved. DX DM2, Code E11.65 Roger Vazquez taking differently: Inject 24 Units into the skin daily. 08/02/18  Yes Thurnell Lose, MD  insulin glargine (LANTUS) 100 UNIT/ML injection Inject 0.15 mLs (15 Units total) into the skin daily. Dispense insulin pen if approved, if not dispense as needed syringes and needles for 1 month supply. Can switch to Levemir. Diagnosis E 11.65. 08/02/18  Yes Thurnell Lose, MD  Insulin Syringe-Needle U-100 25G X 1" 1 ML MISC For 4 times a day insulin SQ, 1 month supply. Diagnosis E11.65 08/02/18  Yes Thurnell Lose, MD  Multiple Vitamin (MULITIVITAMIN WITH MINERALS) TABS Take 1 tablet by mouth daily. 10/12/11  Yes Robbie Lis, MD  blood glucose meter kit and supplies KIT Dispense based on Roger Vazquez and insurance preference. Use up to four times daily as directed. (FOR ICD-9 250.00, 250.01). For QAC - HS accuchecks. 08/02/18   Thurnell Lose, MD  hydrALAZINE (APRESOLINE) 25 MG tablet Take 2 tablets (50 mg total) by mouth every 8 (eight) hours. Roger Vazquez not taking: Reported on 11/20/2018 10/12/11 08/05/18  Robbie Lis, MD    Allergies    Sildenafil  Review of Systems   Review of Systems  Constitutional:  Positive for activity change and chills.  HENT: Negative.    Respiratory: Negative.     Cardiovascular: Negative.   Gastrointestinal: Negative.   Genitourinary: Negative.   Musculoskeletal:        Gangrenous second digit  All other systems reviewed and are negative.  Physical Exam Updated Vital Signs BP (!) 169/94   Pulse 99   Temp 99.5 F (37.5 C) (Oral)   Resp 18   Ht _0  (1.676 m)   Wt 61.7 kg   SpO2 (!) 87%   BMI 21.95 kg/m   Physical Exam Vitals and nursing note reviewed.  Constitutional:      General: He is not in acute distress.    Appearance: He is well-developed. He is not ill-appearing or diaphoretic.  HENT:     Head: Atraumatic.  Eyes:     Pupils: Pupils are equal, round, and reactive  to light.  Cardiovascular:     Rate and Rhythm: Normal rate and regular rhythm.     Pulses:          Dorsalis pedis pulses are 1+ on the right side and detected w/ Doppler on the left side.     Arteriovenous access: Right arteriovenous access is present.    Comments: Doppler only pulses to left lower extremity. Right upper extremity fistula with thrill present Pulmonary:     Effort: Pulmonary effort is normal. No respiratory distress.     Breath sounds: Normal breath sounds.     Comments: Clear bilaterally, speaks in full sentences without difficulty Abdominal:     General: There is no distension.     Palpations: Abdomen is soft.     Comments: Soft, nontender  Musculoskeletal:        General: Normal range of motion.     Cervical back: Normal range of motion and neck supple.     Comments: Diffuse tenderness to right lower extremity from digits to dorsal aspect foot.  No bony tenderness to right lower extremity.  Compartments soft.  Skin:    General: Skin is warm and dry.     Comments: Gangrenous second digit with ulceration to lateral aspect great toe on right lower extremity as well as ulceration to third digit.  Malodorous drainage.  He has some redness to dorsal aspect foot.  See picture in chart.  Neurological:     General: No focal deficit present.      Mental Status: He is alert and oriented to person, place, and time.     Comments: Intact sensation to BLE     ED Results / Procedures / Treatments   Labs (all labs ordered are listed, but only abnormal results are displayed) Labs Reviewed  CBC WITH DIFFERENTIAL/PLATELET - Abnormal; Notable for the following components:      Result Value   RBC 3.22 (*)    Hemoglobin 11.3 (*)    HCT 34.4 (*)    MCV 106.8 (*)    MCH 35.1 (*)    All other components within normal limits  COMPREHENSIVE METABOLIC PANEL - Abnormal; Notable for the following components:   Chloride 95 (*)    Glucose, Bld 206 (*)    Creatinine, Ser 7.45 (*)    Total Protein 8.5 (*)    AST 10 (*)    GFR, Estimated 7 (*)    All other components within normal limits  CULTURE, BLOOD (ROUTINE X 2)  CULTURE, BLOOD (ROUTINE X 2)  RESP PANEL BY RT-PCR (FLU A&B, COVID) ARPGX2  LACTIC ACID, PLASMA  PROTIME-INR  LACTIC ACID, PLASMA  APTT  URINALYSIS, ROUTINE W REFLEX MICROSCOPIC  HEMOGLOBIN A1C    EKG EKG Interpretation  Date/Time:  Thursday April 27 2021 14:43:36 EDT Ventricular Rate:  98 PR Interval:  278 QRS Duration: 74 QT Interval:  441 QTC Calculation: 564 R Axis:   70 Text Interpretation: Sinus rhythm Ventricular premature complex Prolonged PR interval Left ventricular hypertrophy Repol abnrm suggests ischemia, lateral leads Prolonged QT interval Baseline wander in lead(s) V6 Sinus rhythm, first degree block, ST changes old Confirmed by Lavenia Atlas [phone]) on 04/27/2021 2:45:51 PM  Radiology DG Chest Port 1 View  Result Date: 04/27/2021 CLINICAL DATA:  Questionable sepsis.  LEFT toe infection. EXAM: PORTABLE CHEST 1 VIEW COMPARISON:  October of 2019. FINDINGS: Image slightly rotated to the RIGHT. Accounting for this cardiomediastinal contours and hilar structures are normal. Minimal retrocardiac opacity on the LEFT  with linear features. Mild elevation of the LEFT hemidiaphragm may be due to low lung volumes  similar to study from Feb 14, 2011. No sign of pneumothorax. No visible pleural effusion on frontal radiograph. On limited assessment there is no acute skeletal process. IMPRESSION: 1. No acute cardiopulmonary disease. 2. Scarring or atelectasis at the LEFT lung base. 3. Signs of LEFT axillary stenting as on previous imaging. Electronically Signed   By: Zetta Bills M.D.   On: 04/27/2021 14:35   DG Foot Complete Left  Result Date: 04/27/2021 CLINICAL DATA:  Gangrene to the first and second toes of the left foot, diabetes EXAM: LEFT FOOT - COMPLETE 3+ VIEW COMPARISON:  None. FINDINGS: No fracture or dislocation. No periosteal reaction or osseous erosions. No suspicious focal osseous lesions. No significant arthropathy. Mild soft tissue swelling in the distal left foot. Vascular calcifications throughout the soft tissues. No radiopaque foreign bodies. IMPRESSION: Mild distal left foot soft tissue swelling. No specific radiographic findings of osteomyelitis. Electronically Signed   By: Ilona Sorrel M.D.   On: 04/27/2021 13:15    Procedures .Critical Care  Date/Time: 04/27/2021 2:25 PM Performed by: Nettie Elm, PA-C Authorized by: Nettie Elm, PA-C   Critical care provider statement:    Critical care time (minutes):  45   Critical care was necessary to treat or prevent imminent or life-threatening deterioration of the following conditions:  Sepsis and circulatory failure   Critical care was time spent personally by me on the following activities:  Discussions with consultants, evaluation of Roger Vazquez's response to treatment, examination of Roger Vazquez, ordering and performing treatments and interventions, ordering and review of laboratory studies, ordering and review of radiographic studies, pulse oximetry, re-evaluation of Roger Vazquez's condition, obtaining history from Roger Vazquez or surrogate and review of old charts   Medications Ordered in ED Medications  lactated ringers infusion ( Intravenous  New Bag/Given 04/27/21 1452)  ceFEPIme (MAXIPIME) 2 g in sodium chloride 0.9 % 100 mL IVPB (0 g Intravenous Stopped 04/27/21 1520)    Followed by  ceFEPIme (MAXIPIME) 1 g in sodium chloride 0.9 % 100 mL IVPB (has no administration in time range)  vancomycin (VANCOREADY) IVPB 1250 mg/250 mL (1,250 mg Intravenous New Bag/Given 04/27/21 1522)    Followed by  vancomycin (VANCOCIN) IVPB 750 mg/150 ml premix (has no administration in time range)  calcium acetate (PHOSLO) capsule 667 mg (has no administration in time range)  Chlorhexidine Gluconate Cloth 2 % PADS 6 each (has no administration in time range)  insulin glargine (LANTUS) injection 15 Units (has no administration in time range)  insulin aspart (novoLOG) injection 0-6 Units (has no administration in time range)  acetaminophen (TYLENOL) tablet 500 mg (500 mg Oral Given 04/27/21 1448)  oxyCODONE-acetaminophen (PERCOCET/ROXICET) 5-325 MG per tablet 1 tablet (1 tablet Oral Given 04/27/21 1449)    ED Course  I have reviewed the triage vital signs and the nursing notes.  Pertinent labs & imaging results that were available during my care of the Roger Vazquez were reviewed by me and considered in my medical decision making (see chart for details).  Roger Vazquez here for evaluation of wound to left lower extremity.  On arrival he is febrile, tachycardic.  Code sepsis called.  Roger Vazquez has obvious gangrenous second digit with malodorous drainage and ulcerations to first through third digit.  He has some redness to dorsal aspect right foot.  Doppler pulses on left, 1+ pulse to right lower extremity.  ESRD, last dialysis yesterday without complication.  He does not  appear fluid overloaded.   Work-up started from triage which I personally reviewed and interpreted:  CBC without leukocytosis, hemoglobin 87.6 Metabolic panel glucose 811, creatinine 7.45 Lactic acid 1.4 DG left foot without obvious osteomyelitis however there is some soft tissue swelling  Given  decreased pulses on left we will touch base with vascular surgery.  No need for emergent dialysis here in ED.  Roger Vazquez critically ill will need admission for further management of gangrene, necrotic wound, sepsis.  CONSULT with Dr. Melvia Heaps with Nephrology, made aware of Roger Vazquez admission.  CONSULT with Dr. Stanford Breed with Vascular surgery. Will see in consult. Rec medicine admit.  CONSULT with Teaching service who will evaluate Roger Vazquez for admission  The Roger Vazquez appears reasonably stabilized for admission considering the current resources, flow, and capabilities available in the ED at this time, and I doubt any other [facility] requiring further screening and/or treatment in the ED prior to admission.   Roger Vazquez seen and evaluated by attending, Dr. Dina Rich who agrees above treatment, plan and disposition.      MDM Rules/Calculators/A&P                           Final Clinical Impression(s) / ED Diagnoses Final diagnoses:  Sepsis without acute organ dysfunction, due to unspecified organism ([place])  Gangrene ([place])  Cellulitis of left lower extremity  ESRD (end stage renal disease) ([place])    Rx / DC Orders ED Discharge Orders     None        Tamani Durney A, PA-C 04/27/21 [place], [place], DO 04/28/21 [id number]

## 2021-04-27 NOTE — ED Provider Notes (Signed)
Emergency Medicine Provider Triage Evaluation Note  Roger Vazquez , a 70 y.o. male  was evaluated in triage.  Pt complains of left 2nd toe pain and discoloration. Patient evaluated at [facility] prior to arrival and sent to the ED due to gangrene of left 2nd toe with surrounding cellulitis. Patient notes symptoms worsened over the past week. Denies fever and chills. He has a history of DM, HTN, lung cancer, and ESRD on dialysis MWF (last session yesterday).   Review of Systems  Positive: Color changes, wound Negative: fever  Physical Exam  BP (!) 150/80   Pulse (!) 102   Temp 100.2 F (37.9 C) (Oral)   Resp 18   Ht 5\' 6"  (1.676 m)   Wt 61.7 kg   SpO2 100%   BMI 21.95 kg/m  Gen:   Awake, no distress   Resp:  Normal effort  MSK:   Moves extremities without difficulty  Other:  Left first toe with laceration and purulent drainage. Tenderness throughout dorsum of foot and toes 1-4. 1st and 2nd toes are necrotic with possible gangrene to 2nd toe. Unable to palpable pedal pulses will  Medical Decision Making  Medically screening exam initiated at 12:29 PM.  Appropriate orders placed.  SAHID BORBA was informed that the remainder of the evaluation will be completed by another provider, this initial triage assessment does not replace that evaluation, and the importance of remaining in the ED until their evaluation is complete.  Patient non-toxic appearing without hypotension. Sepsis screening labs ordered. X-ray to evaluate for osteomyelitis. Informed RN to change to acuity 2 due to possible sepsis and informed charge, Charise Carwin, about patient.    Suzy Bouchard, PA-C 04/27/21 1235    Lorelle Gibbs, DO 04/27/21 1342

## 2021-04-27 NOTE — Progress Notes (Signed)
Pharmacy Antibiotic Note  Roger Vazquez is a 70 y.o. male admitted on 04/27/2021 with cellulitis.  Pharmacy has been consulted for vancomycin and cefepime dosing.  Plan: Vanc 1250mg  x1 then 750mg  IV on MWF post-HD Cefepime 2g x1 then 1g IV q24h -Monitor renal function, clinical status, and antibiotic plan  Height: 5\' 6"  (167.6 cm) Weight: 61.7 kg (136 lb) IBW/kg (Calculated) : 63.8  Temp (24hrs), Avg:99.9 F (37.7 C), Min:99.6 F (37.6 C), Max:100.2 F (37.9 C)  Recent Labs  Lab 04/27/21 1229  WBC 8.9  CREATININE 7.45*  LATICACIDVEN 1.4    Estimated Creatinine Clearance: 7.9 mL/min (A) (by C-G formula based on SCr of 7.45 mg/dL (H)).    Allergies  Allergen Reactions   Sildenafil Other (See Comments)    Pt stated, "Makes my Blood Pressure drop"   Antimicrobials this admission: Vanc 7/14 >>  Cefepime 7/14 >>   Microbiology results: 7/14 BCx:   Thank you for allowing pharmacy to be a part of this patient's care.  Joetta Manners, PharmD, [facility] Emergency Medicine Clinical Pharmacist ED RPh Phone: [place]: [phone]

## 2021-04-27 NOTE — Hospital Course (Addendum)
Roger Vazquez is a 70 year old male admitted for gangrene of his left 2nd toe and associated LLE cellulitis. PMHx includes ESRD on HD MWF, T2DM, HTN, lung cancer, Hep C, and MRSA bacteremia.   Gangrene of left 2nd toe Roger Vazquez presented to the [facility] ED on narrow his 7/14 with pain and discoloration of his left second toe.  On exam the toe was significantly necrotic and the foot had imperceivable peripheral pulses.  He was also noted to have an ulcer of the left great toe.  He was started on vancomycin and cefepime due to cellulitis with concern for underlying osteomyelitis.  Foot x-ray showed no specific radiographic findings of osteomyelitis.  We were able to narrow his antibiotics to Keflex.  Blood cultures were negative. Vascular surgery was consulted and felt that he would be a candidate for left lower extremity arteriogram with possible angioplasty.  On 7/15 vascular surgery took him to the OR for this procedure and ended up performing angioplasty of multiple vessels in the leg.  He had improvement in his peripheral pulses after this procedure but the gangrene of his second toe was unchanged and vascular surgery felt that he would need amputation.  The patient desired to have this done prior to discharge from this hospitalization.  He had his operation on 7/21 and had good postoperative pain control with oral narcotics.  He was discharged on 7/22 after being ambulating in hospital room with physical therapist.  Discharge recommendations: Patient is quite hard of hearing and it does not appear that he has ever been referred to audiology for this.  Consider referral to audiology. F/u vascular surgery in 3-4 weeks for suture removal and LLE arterial duplex and ABI.  Our nephrology team recommended a renal-appropriate feeding supplement. We gave him Nepro Carb-Steady shakes in hospital, consider continuing these on an outpatient basis.  Given his peripheral vascular disease, he is recommended to be  on a statin. However, given that his LDL is 28, we elected not to start this in the hospital. PCP may engage in shared decision making with patient regarding whether to start a statin.  Due to his angioplasty, we are sending him home with ASA and Plavix. He will follow up with vascular surgery as an outpatient and expect that they will make recommendations regarding duration of therapy.

## 2021-04-27 NOTE — Significant Event (Signed)
Family Medicine Teaching Service will be admitting this patient. Contact info below.   FAMILY MEDICINE TEACHING SERVICE Patient - Please contact intern pager [phone] or text page via website [email or web address] (login: [email or web address]) for questions regarding care. DO NOT page listed attending provider unless there is no answer from the number above.   Ezequiel Essex, MD PGY-2, [place] Medicine Service pager [phone]

## 2021-04-27 NOTE — Sepsis Progress Note (Signed)
Sepsis protocol followed by eLink 

## 2021-04-27 NOTE — Consult Note (Addendum)
Hospital Consult    Reason for Consult:  Gangrene of left foot Requesting Physician:  Chrisandra Netters, MD MRN #:  [id number]  History of Present Illness: This is a 70 y.o. male who is well known to [facility] for multiple prior interventions for dialysis access. He has past medical history significant for ESRD, Diabetes mellitus, and hypertension. He presented to ED due to worsening pain in left toes over past couple days. He says he has had issues over the past few months with his legs on ambulation but pain has just become unbearable. He has never had anything like this happen before. He has no history of prior vascular problems. On presentation he was febrile, tachycardic and hypertensive. WBC normal. Radiographs of left foot show now findings of osteomyelitis. Vascular surgery has been asked to evaluate him for underlying arterial disease with gangrene of his left 2nd toe and ulceration of the 1st toe  Past Medical History:  Diagnosis Date   Cancer ([place])    Lung   Diabetes mellitus    Hypertension    Renal disorder    MWF- [facility]    Past Surgical History:  Procedure Laterality Date   artiovenous graft placement Left    AV FISTULA PLACEMENT Right 11/27/2018   Procedure: INSERTION 4-7MM X 45CM GORETEX GRAFT RIGHT FOREARM ;  Surgeon: Rosetta Posner, MD;  Location: [place];  Service: Vascular;  Laterality: Right;   [place] REMOVAL Left 08/06/2018   Procedure: REMOVAL OF LEFT ARM GRAFT;  Surgeon: Rosetta Posner, MD;  Location: [place];  Service: Vascular;  Laterality: Left;   INSERTION OF DIALYSIS CATHETER Right 08/08/2018   Procedure: INSERTION OF TUNNELED  DIALYSIS CATHETER;  Surgeon: Marty Heck, MD;  Location: [place];  Service: Vascular;  Laterality: Right;   IR REMOVAL TUN CV CATH W/O FL  02/19/2019   LUNG SURGERY     patient not if they removed part of lung. Was done at [place] 07/31/2018   Procedure: TRANSESOPHAGEAL ECHOCARDIOGRAM (TEE);  Surgeon:  Pixie Casino, MD;  Location[facility] ENDOSCOPY;  Service: Cardiovascular;  Laterality: N/A;    Allergies  Allergen Reactions   Sildenafil Other (See Comments)    Pt stated, "Makes my Blood Pressure drop"    Prior to Admission medications   Medication Sig Start Date End Date Taking? Authorizing Provider  aspirin 81 MG tablet Take 81 mg by mouth daily.     Yes [provider]  calcium acetate (PHOSLO) 667 MG capsule Take 667 mg by mouth 3 (three) times daily with meals.   Yes [provider]  calcium carbonate (OS-CAL) 1250 (500 Ca) MG chewable tablet Chew 1 tablet by mouth daily as needed for heartburn. 12/06/16  Yes [provider]  insulin aspart (NOVOLOG) 100 UNIT/ML injection Before each meal 3 times a day, 140-199 - 2 units, 200-250 - 4 units, 251-299 - 6 units,  300-349 - 8 units,  350 or above 10 units. Dispense syringes and needles as needed, Ok to switch to PEN if approved. Substitute to any brand approved. DX DM2, Code E11.65 Patient taking differently: Inject 24 Units into the skin daily. 08/02/18  Yes Thurnell Lose, MD  insulin glargine (LANTUS) 100 UNIT/ML injection Inject 0.15 mLs (15 Units total) into the skin daily. Dispense insulin pen if approved, if not dispense as needed syringes and needles for 1 month supply. Can switch to Levemir. Diagnosis E 11.65. 08/02/18  Yes Thurnell Lose,  MD  Insulin Syringe-Needle U-100 25G X 1" 1 ML MISC For 4 times a day insulin SQ, 1 month supply. Diagnosis E11.65 08/02/18  Yes Thurnell Lose, MD  Multiple Vitamin (MULITIVITAMIN WITH MINERALS) TABS Take 1 tablet by mouth daily. 10/12/11  Yes Robbie Lis, MD  blood glucose meter kit and supplies KIT Dispense based on patient and insurance preference. Use up to four times daily as directed. (FOR ICD-9 250.00, 250.01). For QAC - HS accuchecks. 08/02/18   Thurnell Lose, MD  hydrALAZINE (APRESOLINE) 25 MG tablet Take 2 tablets (50 mg total) by mouth every 8  (eight) hours. Patient not taking: Reported on 11/20/2018 10/12/11 08/05/18  Robbie Lis, MD    Social History   Socioeconomic History   Marital status: Divorced    Spouse name: Not on file   Number of children: Not on file   Years of education: Not on file   Highest education level: Not on file  Occupational History   Not on file  Tobacco Use   Smoking status: Former    Packs/day: 0.50    Years: 10.00    Pack years: 5.00    Types: Cigarettes    Quit date: 2001    Years since quitting: 21.5   Smokeless tobacco: Former    Quit date: 08/01/2015  Vaping Use   Vaping Use: Never used  Substance and Sexual Activity   Alcohol use: Yes    Alcohol/week: 2.0 standard drinks    Types: 2 Cans of beer per week   Drug use: Yes    Types: Marijuana    Comment: last time 11/19/2018   Sexual activity: Not on file    Comment: Pt uses vicodin  Other Topics Concern   Not on file  Social History Narrative   Not on file   Social Determinants of Health   Financial Resource Strain: Not on file  Food Insecurity: Not on file  Transportation Needs: Not on file  Physical Activity: Not on file  Stress: Not on file  Social Connections: Not on file  Intimate Partner Violence: Not on file    No family history on file.  ROS: Otherwise negative unless mentioned in HPI  Physical Examination  Vitals:   04/27/21 1534 04/27/21 1645  BP:  (!) 157/107  Pulse:  88  Resp:    Temp: 99.5 F (37.5 C) 98.8 F (37.1 C)  SpO2:  100%   Body mass index is 21.95 kg/m.  General:  WDWN in NAD Gait: Not observed HENT: WNL, normocephalic Pulmonary: normal non-labored breathing Cardiac: Tachycardic Vascular Exam/Pulses: Doppler PT and DP signals in bilateral lower extremities. Feet warm. Palpable right Popliteal pulse, left popliteal pulse absent Extremities: with ischemic changes of 1st and 2nd toes, with Gangrene of left 2nd toe, dry , without cellulitis; with ulceration of the dorsum of the left  great toe at base of nailbed;  Musculoskeletal: no muscle wasting or atrophy  Neurologic: A&O X 3;  No focal weakness or paresthesias are detected; speech is fluent/normal Psychiatric:  The pt has Normal affect.  CBC    Component Value Date/Time   WBC 8.9 04/27/2021 1229   RBC 3.22 (L) 04/27/2021 1229   HGB 11.3 (L) 04/27/2021 1229   HCT 34.4 (L) 04/27/2021 1229   PLT 295 04/27/2021 1229   MCV 106.8 (H) 04/27/2021 1229   MCH 35.1 (H) 04/27/2021 1229   MCHC 32.8 04/27/2021 1229   RDW 13.6 04/27/2021 1229   LYMPHSABS 1.4 04/27/2021  1229   MONOABS 0.7 04/27/2021 1229   EOSABS 0.1 04/27/2021 1229   BASOSABS 0.0 04/27/2021 1229    BMET    Component Value Date/Time   NA 137 04/27/2021 1229   K 3.7 04/27/2021 1229   CL 95 (L) 04/27/2021 1229   CO2 29 04/27/2021 1229   GLUCOSE 206 (H) 04/27/2021 1229   BUN 23 04/27/2021 1229   CREATININE 7.45 (H) 04/27/2021 1229   CALCIUM 8.9 04/27/2021 1229   CALCIUM 7.1 (L) 08/01/2018 0944   GFRNONAA 7 (L) 04/27/2021 1229   GFRAA 5 (L) 08/11/2018 0858    COAGS: Lab Results  Component Value Date   INR 1.0 04/27/2021   INR 1.08 07/31/2018   INR 0.99 07/27/2018     Non-Invasive Vascular Imaging:   Pending ABI  Statin:  No. Beta Blocker:  No. Aspirin:  Yes.   ACEI:  No. ARB:  No. CCB use:  No Other antiplatelets/anticoagulants:  No.    ASSESSMENT/PLAN: This is a 70 y.o. male with peripheral arterial disease with ulceration left 1st toe and gangrene of 2nd toe. He has doppler signals in PT and DP in both lower extremities. He does not have a palpable popliteal pulse in his left leg. Suspect possible occlusion. Looking back to arterial studies from September of 2021 he had some elevated velocities in his left popliteal artery which have likely worsened or now is occlusive. He has known tibial artery disease based on prior ABI. I placed order for stat ABI, which is pending. Patient was seen with Dr. Stanford Breed and recommendation is to  proceed with Arteriogram with possible intervention. Risks and benefits were discussed with the patient - Recommend Statin and continued Aspirin Therapy  - Consent ordered - Keep NPO after midnight - Plan for Aortogram, arteriogram BLE with possible intervention tomorrow with Dr. Susanne Borders PA-C [facility] [phone] 04/27/2021  5:07 PM  VASCULAR STAFF ADDENDUM: I have independently interviewed and examined the patient. I agree with the above.   [name]. Stanford Breed, MD [facility] Phone Number: [phone] 04/27/2021 7:11 PM

## 2021-04-27 NOTE — Progress Notes (Addendum)
[place] KIDNEY BRIEF PROGRESS NOTE  Roger Vazquez [birth year] [id number]  ESRD patient on HD MWF at [facility] admitted to observation for L toe infection/gangrene.  Patient known to our practice.  Completed last dialysis yesterday.  Labs stable. CXR with no acute abnormalities.  No acute indications for dialysis today.  Will write orders for HD tomorrow per regular schedule.  If deemed necessary for inpatient status will complete full consult.   OP HD Orders: [facility] M[facility] 3hrs 400/ AF 1.5  2K 3Ca Hect 2 qHD Hep 4000unit bolus Venofer 40mg  qHD Mircera 36mcg q2wks - last 7/13  Jen Mow, PA-C [employer]

## 2021-04-27 NOTE — ED Triage Notes (Signed)
Pt reports pain to Lt great toe over last 3 days.

## 2021-04-27 NOTE — ED Provider Notes (Signed)
HPI  SUBJECTIVE:  Roger Vazquez is a 70 y.o. male who presents with 3 to 4 days of second toe pain, discoloration after bumping his foot against a file cabinet.  He states that his second toe turned black 3 to 4 days ago.  No fevers.  He has tried elevation without improvement in symptoms.  No aggravating or alleviating factors.  He has a past medical history of diabetes, hypertension, end-stage renal disease on dialysis Monday Wednesday Friday.  Completed dialysis yesterday.  Also history of hepatitis C, MRSA bacteremia, sepsis, cancer.  Denies history of peripheral neuropathy or foot gangrene.   Past Medical History:  Diagnosis Date   Cancer ([place])    Lung   Diabetes mellitus    Hypertension    Renal disorder    MWF- [facility]    Past Surgical History:  Procedure Laterality Date   artiovenous graft placement Left    AV FISTULA PLACEMENT Right 11/27/2018   Procedure: INSERTION 4-7MM X 45CM GORETEX GRAFT RIGHT FOREARM ;  Surgeon: Rosetta Posner, MD;  Location: [place];  Service: Vascular;  Laterality: Right;   [place] Left 08/06/2018   Procedure: REMOVAL OF LEFT ARM GRAFT;  Surgeon: Rosetta Posner, MD;  Location: [place];  Service: Vascular;  Laterality: Left;   INSERTION OF DIALYSIS CATHETER Right 08/08/2018   Procedure: INSERTION OF TUNNELED  DIALYSIS CATHETER;  Surgeon: Marty Heck, MD;  Location: [place];  Service: Vascular;  Laterality: Right;   IR REMOVAL TUN CV CATH W/O FL  02/19/2019   LUNG SURGERY     patient not if they removed part of lung. Was done at [place] 07/31/2018   Procedure: TRANSESOPHAGEAL ECHOCARDIOGRAM (TEE);  Surgeon: Pixie Casino, MD;  Location: [facility] ENDOSCOPY;  Service: Cardiovascular;  Laterality: N/A;    History reviewed. No pertinent family history.  Social History   Tobacco Use   Smoking status: Former    Packs/day: 0.50    Years: 10.00    Pack years: 5.00    Types: Cigarettes    Quit date: 2001    Years  since quitting: 21.5   Smokeless tobacco: Former    Quit date: 08/01/2015  Vaping Use   Vaping Use: Never used  Substance Use Topics   Alcohol use: Yes    Alcohol/week: 2.0 standard drinks    Types: 2 Cans of beer per week   Drug use: Yes    Types: Marijuana    Comment: last time 11/19/2018    No current facility-administered medications for this encounter.  Current Outpatient Medications:    aspirin 81 MG tablet, Take 81 mg by mouth daily.  , Disp: , Rfl:    blood glucose meter kit and supplies KIT, Dispense based on patient and insurance preference. Use up to four times daily as directed. (FOR ICD-9 250.00, 250.01). For QAC - HS accuchecks., Disp: 1 each, Rfl: 1   calcium acetate (PHOSLO) 667 MG capsule, Take 667 mg by mouth 3 (three) times daily with meals., Disp: , Rfl:    collagenase (SANTYL) ointment, Apply 1 application topically daily. Apply to hallux ulcer daily with saline moistened gauze and dry gauze dressing over this, Disp: 30 g, Rfl: 2   hydrALAZINE (APRESOLINE) 25 MG tablet, Take 2 tablets (50 mg total) by mouth every 8 (eight) hours. (Patient not taking: Reported on 11/20/2018), Disp: 90 tablet, Rfl: 3   insulin aspart (NOVOLOG) 100 UNIT/ML injection, Before each meal 3  times a day, 140-199 - 2 units, 200-250 - 4 units, 251-299 - 6 units,  300-349 - 8 units,  350 or above 10 units. Dispense syringes and needles as needed, Ok to switch to PEN if approved. Substitute to any brand approved. DX DM2, Code E11.65 (Patient taking differently: Inject 2-8 Units into the skin See admin instructions. Before each meal 3 times a day, 140-199 - 2 units, 200-250 - 4 units, 251-299 - 6 units,  300-349 - 8 units,  350 or above 10 units. Dispense syringes and needles as needed, Ok to switch to PEN if approved. Substitute to any brand approved. DX DM2, Code E11.65), Disp: 1 vial, Rfl: 0   insulin glargine (LANTUS) 100 UNIT/ML injection, Inject 0.15 mLs (15 Units total) into the skin daily. Dispense  insulin pen if approved, if not dispense as needed syringes and needles for 1 month supply. Can switch to Levemir. Diagnosis E 11.65., Disp: 10 mL, Rfl: 0   Insulin Syringe-Needle U-100 25G X 1" 1 ML MISC, For 4 times a day insulin SQ, 1 month supply. Diagnosis E11.65, Disp: 30 each, Rfl: 0   Multiple Vitamin (MULITIVITAMIN WITH MINERALS) TABS, Take 1 tablet by mouth daily., Disp: 30 tablet, Rfl: 0   pantoprazole (PROTONIX) 40 MG tablet, Take 1 tablet (40 mg total) by mouth 2 (two) times daily. (Patient taking differently: Take 40 mg by mouth daily as needed (heartburn). ), Disp: 60 tablet, Rfl: 3  Allergies  Allergen Reactions   Sildenafil Other (See Comments)    Pt stated, "Makes my Blood Pressure drop"     ROS  As noted in HPI.   Physical Exam  BP 123/85 (BP Location: Left Arm)   Pulse 100   Temp 99.6 F (37.6 C) (Oral)   Resp 20   SpO2 97%   Constitutional: Well developed, well nourished, appears in trouble Eyes:  EOMI, conjunctiva normal bilaterally HENT: Normocephalic, atraumatic,mucus membranes moist Respiratory: Normal inspiratory effort Cardiovascular: Normal rate GI: nondistended skin: No rash, skin intact Musculoskeletal:   Laceration to left first toe with odorous purulent drainage.  Positive tenderness over both toes.  Both toes are black, necrotic.  Second toe appears to be gangrenous.     Neurologic: Alert & oriented x 3, no focal neuro deficits Psychiatric: Speech and behavior appropriate   ED Course   Medications - No data to display  Orders Placed This Encounter  Procedures   POC CBG, ED    Standing Status:   Standing    Number of Occurrences:   1    Results for orders placed or performed during the hospital encounter of 04/27/21 (from the past 24 hour(s))  POC CBG, ED     Status: Abnormal   Collection Time: 04/27/21 11:56 AM  Result Value Ref Range   Glucose-Capillary 206 (H) 70 - 99 mg/dL   No results found.  ED Clinical  Impression  1. Gangrene of toe ([place])   2. Necrotic toes ([place])   3. Left foot infection      ED Assessment/Plan  Patient with gangrene of the second toe, necrotic first toe.  Discussed with him that he needs to go to the emergency department where he will have lab work, imaging, and will require IV antibiotics, debridement.  Informed him that he most likely will be admitted and may lose his toes.  Emphasized importance of going to the emergency department.  He is stable to go by private vehicle.  Patient agrees with plan.  No orders of the defined types were placed in this encounter.     *This clinic note was created using Dragon dictation software. Therefore, there may be occasional mistakes despite careful proofreading.  ?    Melynda Ripple, MD 04/27/21 1205

## 2021-04-27 NOTE — Progress Notes (Signed)
ABI completed. Refer to "CV Proc" under chart review to view preliminary results.  04/27/2021 6:39 PM Kelby Aline., MHA, RVT, RDCS, RDMS

## 2021-04-28 ENCOUNTER — Encounter (HOSPITAL_COMMUNITY)
Admission: EM | Disposition: A | Discharge status: Home / Self Care | Payer: Self-pay | Source: Home / Self Care | Attending: Family Medicine

## 2021-04-28 DIAGNOSIS — Z7982 Long term (current) use of aspirin: Secondary | ICD-10-CM | POA: Diagnosis not present

## 2021-04-28 DIAGNOSIS — D631 Anemia in chronic kidney disease: Secondary | ICD-10-CM | POA: Diagnosis present

## 2021-04-28 DIAGNOSIS — B182 Chronic viral hepatitis C: Secondary | ICD-10-CM | POA: Diagnosis present

## 2021-04-28 DIAGNOSIS — I70262 Atherosclerosis of native arteries of extremities with gangrene, left leg: Secondary | ICD-10-CM | POA: Diagnosis present

## 2021-04-28 DIAGNOSIS — E11621 Type 2 diabetes mellitus with foot ulcer: Secondary | ICD-10-CM | POA: Diagnosis present

## 2021-04-28 DIAGNOSIS — F1010 Alcohol abuse, uncomplicated: Secondary | ICD-10-CM | POA: Diagnosis present

## 2021-04-28 DIAGNOSIS — Z87891 Personal history of nicotine dependence: Secondary | ICD-10-CM | POA: Diagnosis not present

## 2021-04-28 DIAGNOSIS — N186 End stage renal disease: Secondary | ICD-10-CM | POA: Diagnosis present

## 2021-04-28 DIAGNOSIS — E1122 Type 2 diabetes mellitus with diabetic chronic kidney disease: Secondary | ICD-10-CM | POA: Diagnosis present

## 2021-04-28 DIAGNOSIS — F1290 Cannabis use, unspecified, uncomplicated: Secondary | ICD-10-CM | POA: Diagnosis present

## 2021-04-28 DIAGNOSIS — Z992 Dependence on renal dialysis: Secondary | ICD-10-CM | POA: Diagnosis not present

## 2021-04-28 DIAGNOSIS — M79605 Pain in left leg: Secondary | ICD-10-CM | POA: Diagnosis not present

## 2021-04-28 DIAGNOSIS — B1920 Unspecified viral hepatitis C without hepatic coma: Secondary | ICD-10-CM | POA: Diagnosis present

## 2021-04-28 DIAGNOSIS — K219 Gastro-esophageal reflux disease without esophagitis: Secondary | ICD-10-CM | POA: Diagnosis present

## 2021-04-28 DIAGNOSIS — I96 Gangrene, not elsewhere classified: Secondary | ICD-10-CM

## 2021-04-28 DIAGNOSIS — L97529 Non-pressure chronic ulcer of other part of left foot with unspecified severity: Secondary | ICD-10-CM | POA: Diagnosis present

## 2021-04-28 DIAGNOSIS — Z85118 Personal history of other malignant neoplasm of bronchus and lung: Secondary | ICD-10-CM | POA: Diagnosis not present

## 2021-04-28 DIAGNOSIS — Z794 Long term (current) use of insulin: Secondary | ICD-10-CM | POA: Diagnosis not present

## 2021-04-28 DIAGNOSIS — A419 Sepsis, unspecified organism: Secondary | ICD-10-CM | POA: Diagnosis not present

## 2021-04-28 DIAGNOSIS — Z20822 Contact with and (suspected) exposure to covid-19: Secondary | ICD-10-CM | POA: Diagnosis present

## 2021-04-28 DIAGNOSIS — H9190 Unspecified hearing loss, unspecified ear: Secondary | ICD-10-CM | POA: Diagnosis present

## 2021-04-28 DIAGNOSIS — E1152 Type 2 diabetes mellitus with diabetic peripheral angiopathy with gangrene: Secondary | ICD-10-CM | POA: Diagnosis present

## 2021-04-28 DIAGNOSIS — L03116 Cellulitis of left lower limb: Secondary | ICD-10-CM | POA: Diagnosis present

## 2021-04-28 DIAGNOSIS — N2581 Secondary hyperparathyroidism of renal origin: Secondary | ICD-10-CM | POA: Diagnosis present

## 2021-04-28 DIAGNOSIS — Z79899 Other long term (current) drug therapy: Secondary | ICD-10-CM | POA: Diagnosis not present

## 2021-04-28 DIAGNOSIS — I120 Hypertensive chronic kidney disease with stage 5 chronic kidney disease or end stage renal disease: Secondary | ICD-10-CM | POA: Diagnosis present

## 2021-04-28 DIAGNOSIS — Z833 Family history of diabetes mellitus: Secondary | ICD-10-CM | POA: Diagnosis not present

## 2021-04-28 HISTORY — PX: PERIPHERAL VASCULAR BALLOON ANGIOPLASTY: CATH118281

## 2021-04-28 HISTORY — PX: ABDOMINAL AORTOGRAM W/LOWER EXTREMITY: CATH118223

## 2021-04-28 LAB — GLUCOSE, CAPILLARY
Glucose-Capillary: 115 mg/dL — ABNORMAL HIGH (ref 70–99)
Glucose-Capillary: 120 mg/dL — ABNORMAL HIGH (ref 70–99)
Glucose-Capillary: 120 mg/dL — ABNORMAL HIGH (ref 70–99)
Glucose-Capillary: 137 mg/dL — ABNORMAL HIGH (ref 70–99)
Glucose-Capillary: 147 mg/dL — ABNORMAL HIGH (ref 70–99)
Glucose-Capillary: 192 mg/dL — ABNORMAL HIGH (ref 70–99)

## 2021-04-28 LAB — CBC
HCT: 32.1 % — ABNORMAL LOW (ref 39.0–52.0)
Hemoglobin: 10.3 g/dL — ABNORMAL LOW (ref 13.0–17.0)
MCH: 34.4 pg — ABNORMAL HIGH (ref 26.0–34.0)
MCHC: 32.1 g/dL (ref 30.0–36.0)
MCV: 107.4 fL — ABNORMAL HIGH (ref 80.0–100.0)
Platelets: 254 10*3/uL (ref 150–400)
RBC: 2.99 MIL/uL — ABNORMAL LOW (ref 4.22–5.81)
RDW: 13.8 % (ref 11.5–15.5)
WBC: 7.5 10*3/uL (ref 4.0–10.5)
nRBC: 0 % (ref 0.0–0.2)

## 2021-04-28 LAB — COMPREHENSIVE METABOLIC PANEL
ALT: 9 U/L (ref 0–44)
AST: 7 U/L — ABNORMAL LOW (ref 15–41)
Albumin: 3 g/dL — ABNORMAL LOW (ref 3.5–5.0)
Alkaline Phosphatase: 74 U/L (ref 38–126)
Anion gap: 9 (ref 5–15)
BUN: 28 mg/dL — ABNORMAL HIGH (ref 8–23)
CO2: 28 mmol/L (ref 22–32)
Calcium: 8.6 mg/dL — ABNORMAL LOW (ref 8.9–10.3)
Chloride: 99 mmol/L (ref 98–111)
Creatinine, Ser: 7.97 mg/dL — ABNORMAL HIGH (ref 0.61–1.24)
GFR, Estimated: 7 mL/min — ABNORMAL LOW (ref >60)
Glucose, Bld: 168 mg/dL — ABNORMAL HIGH (ref 70–99)
Potassium: 3.8 mmol/L (ref 3.5–5.1)
Sodium: 136 mmol/L (ref 135–145)
Total Bilirubin: 0.3 mg/dL (ref 0.3–1.2)
Total Protein: 7.3 g/dL (ref 6.5–8.1)

## 2021-04-28 LAB — POCT ACTIVATED CLOTTING TIME: Activated Clotting Time: 254 seconds

## 2021-04-28 LAB — SURGICAL PCR SCREEN
MRSA, PCR: NEGATIVE
Staphylococcus aureus: NEGATIVE

## 2021-04-28 LAB — HEPATITIS B SURFACE ANTIGEN: Hepatitis B Surface Ag: NONREACTIVE

## 2021-04-28 SURGERY — ABDOMINAL AORTOGRAM W/LOWER EXTREMITY
Anesthesia: LOCAL | Laterality: Left

## 2021-04-28 MED ORDER — HEPARIN (PORCINE) IN NACL 1000-0.9 UT/500ML-% IV SOLN
INTRAVENOUS | Status: AC
  Filled 2021-04-28: qty 1000

## 2021-04-28 MED ORDER — SODIUM CHLORIDE 0.9 % IV SOLN: 100.0000 mL | INTRAVENOUS | Status: DC | PRN

## 2021-04-28 MED ORDER — PENTAFLUOROPROP-TETRAFLUOROETH EX AERO: 1.0000 "application " | INHALATION_SPRAY | CUTANEOUS | Status: DC | PRN

## 2021-04-28 MED ORDER — ATORVASTATIN CALCIUM 40 MG PO TABS: 40.0000 mg | ORAL_TABLET | Freq: Every day | ORAL | Status: DC

## 2021-04-28 MED ORDER — HEPARIN (PORCINE) IN NACL 1000-0.9 UT/500ML-% IV SOLN
INTRAVENOUS | Status: DC | PRN
  Administered 2021-04-28 (×2): 500 mL

## 2021-04-28 MED ORDER — VANCOMYCIN HCL 750 MG/150ML IV SOLN
INTRAVENOUS | Status: AC
  Administered 2021-04-28: 750 mg
  Filled 2021-04-28: qty 150

## 2021-04-28 MED ORDER — CLOPIDOGREL BISULFATE 300 MG PO TABS
ORAL_TABLET | ORAL | Status: DC | PRN
  Administered 2021-04-28: 300 mg via ORAL

## 2021-04-28 MED ORDER — FENTANYL CITRATE (PF) 100 MCG/2ML IJ SOLN
INTRAMUSCULAR | Status: DC | PRN
  Administered 2021-04-28: 50 ug via INTRAVENOUS

## 2021-04-28 MED ORDER — HEPARIN SODIUM (PORCINE) 1000 UNIT/ML DIALYSIS
1000.0000 [IU] | INTRAMUSCULAR | Status: DC | PRN
  Filled 2021-04-28: qty 1

## 2021-04-28 MED ORDER — ONDANSETRON HCL 4 MG/2ML IJ SOLN: 4.0000 mg | Freq: Four times a day (QID) | INTRAMUSCULAR | Status: DC | PRN

## 2021-04-28 MED ORDER — ACETAMINOPHEN 325 MG PO TABS: 650.0000 mg | ORAL_TABLET | ORAL | Status: DC | PRN

## 2021-04-28 MED ORDER — ASPIRIN EC 81 MG PO TBEC: 81.0000 mg | DELAYED_RELEASE_TABLET | Freq: Every day | ORAL | Status: DC

## 2021-04-28 MED ORDER — CLOPIDOGREL BISULFATE 75 MG PO TABS
75.0000 mg | ORAL_TABLET | Freq: Every day | ORAL | Status: DC
  Administered 2021-04-29 – 2021-05-05 (×6): 75 mg via ORAL
  Filled 2021-04-28 (×7): qty 1

## 2021-04-28 MED ORDER — HEPARIN SODIUM (PORCINE) 5000 UNIT/ML IJ SOLN
5000.0000 [IU] | Freq: Three times a day (TID) | INTRAMUSCULAR | Status: DC
  Administered 2021-04-28 – 2021-05-05 (×19): 5000 [IU] via SUBCUTANEOUS
  Filled 2021-04-28 (×20): qty 1

## 2021-04-28 MED ORDER — HYDRALAZINE HCL 20 MG/ML IJ SOLN: 5.0000 mg | INTRAMUSCULAR | Status: DC | PRN

## 2021-04-28 MED ORDER — HEPARIN SODIUM (PORCINE) 1000 UNIT/ML IJ SOLN
INTRAMUSCULAR | Status: DC | PRN
  Administered 2021-04-28: 6000 [IU] via INTRAVENOUS

## 2021-04-28 MED ORDER — LIDOCAINE HCL (PF) 1 % IJ SOLN
INTRAMUSCULAR | Status: DC | PRN
  Administered 2021-04-28: 15 mL via INTRADERMAL

## 2021-04-28 MED ORDER — IODIXANOL 320 MG/ML IV SOLN
INTRAVENOUS | Status: DC | PRN
  Administered 2021-04-28: 95 mL

## 2021-04-28 MED ORDER — MIDAZOLAM HCL 2 MG/2ML IJ SOLN
INTRAMUSCULAR | Status: DC | PRN
  Administered 2021-04-28: 1 mg via INTRAVENOUS

## 2021-04-28 MED ORDER — CLOPIDOGREL BISULFATE 75 MG PO TABS: 75.0000 mg | ORAL_TABLET | Freq: Every day | ORAL | Status: DC

## 2021-04-28 MED ORDER — CLOPIDOGREL BISULFATE 300 MG PO TABS
ORAL_TABLET | ORAL | Status: AC
  Filled 2021-04-28: qty 1

## 2021-04-28 MED ORDER — SODIUM CHLORIDE 0.9 % IV SOLN
1.0000 g | INTRAVENOUS | Status: DC
  Administered 2021-04-28 – 2021-04-29 (×2): 1 g via INTRAVENOUS
  Filled 2021-04-28 (×4): qty 1

## 2021-04-28 MED ORDER — LIDOCAINE HCL (PF) 1 % IJ SOLN
INTRAMUSCULAR | Status: AC
  Filled 2021-04-28: qty 30

## 2021-04-28 MED ORDER — ATORVASTATIN CALCIUM 40 MG PO TABS
40.0000 mg | ORAL_TABLET | Freq: Every day | ORAL | Status: DC
  Administered 2021-04-28 – 2021-05-05 (×7): 40 mg via ORAL
  Filled 2021-04-28 (×8): qty 1

## 2021-04-28 MED ORDER — SODIUM CHLORIDE 0.9% FLUSH
3.0000 mL | Freq: Two times a day (BID) | INTRAVENOUS | Status: DC
  Administered 2021-04-29 – 2021-05-03 (×9): 3 mL via INTRAVENOUS

## 2021-04-28 MED ORDER — LIDOCAINE-EPINEPHRINE 1 %-1:100000 IJ SOLN
INTRAMUSCULAR | Status: AC
  Filled 2021-04-28: qty 1

## 2021-04-28 MED ORDER — ASPIRIN EC 81 MG PO TBEC
81.0000 mg | DELAYED_RELEASE_TABLET | Freq: Every day | ORAL | Status: DC
  Administered 2021-04-28 – 2021-05-05 (×7): 81 mg via ORAL
  Filled 2021-04-28 (×8): qty 1

## 2021-04-28 MED ORDER — INSULIN GLARGINE 100 UNIT/ML ~~LOC~~ SOLN
15.0000 [IU] | Freq: Every day | SUBCUTANEOUS | Status: DC
  Filled 2021-04-28: qty 0.15

## 2021-04-28 MED ORDER — FENTANYL CITRATE (PF) 100 MCG/2ML IJ SOLN
INTRAMUSCULAR | Status: AC
  Filled 2021-04-28: qty 2

## 2021-04-28 MED ORDER — SODIUM CHLORIDE 0.9 % IV SOLN: 250.0000 mL | INTRAVENOUS | Status: DC | PRN

## 2021-04-28 MED ORDER — LIDOCAINE HCL (PF) 1 % IJ SOLN: 5.0000 mL | INTRAMUSCULAR | Status: DC | PRN

## 2021-04-28 MED ORDER — CLOPIDOGREL BISULFATE 75 MG PO TABS: 300.0000 mg | ORAL_TABLET | Freq: Once | ORAL | Status: DC

## 2021-04-28 MED ORDER — HEPARIN SODIUM (PORCINE) 1000 UNIT/ML IJ SOLN
INTRAMUSCULAR | Status: AC
  Filled 2021-04-28: qty 1

## 2021-04-28 MED ORDER — LIDOCAINE-PRILOCAINE 2.5-2.5 % EX CREA
1.0000 "application " | TOPICAL_CREAM | CUTANEOUS | Status: DC | PRN
  Filled 2021-04-28: qty 5

## 2021-04-28 MED ORDER — MIDAZOLAM HCL 2 MG/2ML IJ SOLN
INTRAMUSCULAR | Status: AC
  Filled 2021-04-28: qty 2

## 2021-04-28 MED ORDER — LIDOCAINE-EPINEPHRINE 1 %-1:100000 IJ SOLN
INTRAMUSCULAR | Status: DC | PRN
  Administered 2021-04-28: 20 mg

## 2021-04-28 MED ORDER — ALTEPLASE 2 MG IJ SOLR: 2.0000 mg | Freq: Once | INTRAMUSCULAR | Status: DC | PRN

## 2021-04-28 MED ORDER — LABETALOL HCL 5 MG/ML IV SOLN: 10.0000 mg | INTRAVENOUS | Status: DC | PRN

## 2021-04-28 MED ORDER — HEPARIN SODIUM (PORCINE) 1000 UNIT/ML DIALYSIS
4000.0000 [IU] | INTRAMUSCULAR | Status: DC | PRN
  Filled 2021-04-28: qty 4

## 2021-04-28 MED ORDER — SODIUM CHLORIDE 0.9% FLUSH: 3.0000 mL | INTRAVENOUS | Status: DC | PRN

## 2021-04-28 SURGICAL SUPPLY — 20 items
BALLN MUSTANG 5X80X135 (BALLOONS) ×2
BALLN STERLING OTW 3X150X150 (BALLOONS) ×2
BALLOON MUSTANG 5X80X135 (BALLOONS) IMPLANT
BALLOON STERLING OTW 3X150X150 (BALLOONS) IMPLANT
CATH OMNI FLUSH 5F 65CM (CATHETERS) ×1 IMPLANT
CATH QUICKCROSS .018X135CM (MICROCATHETER) ×1 IMPLANT
CLOSURE PERCLOSE PROSTYLE (VASCULAR PRODUCTS) ×1 IMPLANT
GLIDEWIRE ADV .035X260CM (WIRE) ×1 IMPLANT
GUIDEWIRE ANGLED .035X150CM (WIRE) ×1 IMPLANT
KIT ENCORE 26 ADVANTAGE (KITS) ×1 IMPLANT
KIT MICROPUNCTURE NIT STIFF (SHEATH) ×1 IMPLANT
KIT PV (KITS) ×2 IMPLANT
SHEATH PINNACLE 5F 10CM (SHEATH) ×1 IMPLANT
SHEATH PINNACLE ST 6F 65CM (SHEATH) ×1 IMPLANT
SHEATH PROBE COVER 6X72 (BAG) ×1 IMPLANT
SYR MEDRAD MARK V 150ML (SYRINGE) ×1 IMPLANT
TRANSDUCER W/STOPCOCK (MISCELLANEOUS) ×2 IMPLANT
TRAY PV CATH (CUSTOM PROCEDURE TRAY) ×2 IMPLANT
WIRE BENTSON .035X145CM (WIRE) ×1 IMPLANT
WIRE G V18X300CM (WIRE) ×1 IMPLANT

## 2021-04-28 NOTE — Progress Notes (Addendum)
   [place] KID[facility] BRIEF PROGRESS NOTE   Roger Vazquez 02-Nov-1950 [id number]   He reported he tolerated dialysis  on his schedule =MWF, today had 1 L UF .  Currently complains of left /Toe  foot discomfort, denies chest pain or shortness of breath  Noted patient still in observation status .  Noted plans for for Aortogram, arteriogram BLE with possible intervention today with Dr. Stanford Breed .    If deemed necessary for inpatient status will do complete full consult.   OP HD Orders: [place] MWF 3hrs 400/ AF 1.5 2K 3Ca Hect 2 qHD Hep 4000unit bolus Venofer 40mg  qHD Mircera 37mcg q2wks - last 7/13  Ernest Haber PA-C [facility]

## 2021-04-28 NOTE — Progress Notes (Signed)
Family Medicine Teaching Service Daily Progress Note Intern Pager: [phone]  Patient name: Roger Vazquez Medical record number: [id number] Date of birth: [birth year] Age: 70 y.o. Gender: male  Primary Care Provider: [facility] Consultants: Vascular, nephrology Code Status: Full  Pt Overview and Major Events to Date:  7/14 admitted  Assessment and Plan: Roger Vazquez is a 70 year old male who presented with gangrene of the left with overlying cellulitis.  PMH is significant for T2DM, HTN, ESRD, lung cancer and hepatitis C.   Gangrene no stool with overlying cellulitis Patient was admitted for worsening ulceration of the first left toe and gangrene of the second left toe in the setting of peripheral arterial disease.  Blood cultures negative for MRSA and Staph A. -NPO -ABI pending -f/u blood culture - Bilateral arteriogram today -Continue cefepime 1 g -Continue vancomycin 750 mg -Dilaudid 1 mg prn -Tylenol 500 mg q6h  ESRD on HD Patient's home regimen includes HD MWF.  Nephrology is following and will be scheduling HD today for patient.  Creatinine today is 7.97 -Nephrology following,  Appreciate recs -Continue daily BMP -Dialysis scheduled for today   Type 2 diabetes Patient with poorly controlled T2DM was admitted with BG of 206 on admission.  His A1c is 7.9 and BG this morning is 120. Patient will be NPO this morning for Vascular procedure and will hold lantus -Monitors daily CBG - continue sliding scale Insulin -NPO per Vascular team   Prolonged QTC High but improved Qtc of 518 since admission -avoid meds that prolong QT -continue cardiac monitoring  Hypertension Bp this AM is 171/81. Take Hydralazine for home medication - continue home med of 40mg  hydralazine    History of alcohol use -Continue CIWA protocol.  History of Hepatitis C - f/u for HCV RNA Quant   FEN/GI: N.p.o. PPx: Heparin Dispo:Home pending clinical improvement . Barriers  include vascular procedure.   Subjective:  Patient was laying comfortably supine while on dialysis.  He said he is doing well and not in any active distress.  He rates his left foot pain 4 out of 10 and his only complaint was poor sleep last night.  Objective: Temp:  [97.4 F (36.3 C)-100.2 F (37.9 C)] 98.4 F (36.9 C) (07/15 0735) Pulse Rate:  [59-102] 78 (07/15 0735) Resp:  [14-20] 14 (07/15 0735) BP: (123-180)/(78-117) 171/81 (07/15 0735) SpO2:  [78 %-100 %] 100 % (07/15 0735) Weight:  [61.7 kg] 61.7 kg (07/14 1216) Physical Exam: General: Alert, NAD, oriented x4 Cardiovascular: RRR, no murmurs, no rales Respiratory: Clear breath sounds bilaterally, no wheezing Abdomen: No abdominal distention or tenderness Extremities: Positive for gangrene in the left second toe.  Laboratory: Recent Labs  Lab 04/27/21 1229  WBC 8.9  HGB 11.3*  HCT 34.4*  PLT 295   Recent Labs  Lab 04/27/21 1229 04/28/21 0202  NA 137 136  K 3.7 3.8  CL 95* 99  CO2 29 28  BUN 23 28*  CREATININE 7.45* 7.97*  CALCIUM 8.9 8.6*  PROT 8.5* 7.3  BILITOT 0.4 0.3  ALKPHOS 88 74  ALT 9 9  AST 10* 7*  GLUCOSE 206* 168*      Imaging/Diagnostic Tests: No image studies  Alen Bleacher, MD 04/28/2021, 7:48 AM PGY-1 , [place] Intern pager: [phone], text pages welcome

## 2021-04-28 NOTE — Op Note (Signed)
DATE OF SERVICE: 04/28/2021  PATIENT:  Roger Vazquez  70 y.o. male  PRE-OPERATIVE DIAGNOSIS:  Atherosclerosis of native arteries of left lower extremity causing gangrene  POST-OPERATIVE DIAGNOSIS:  Same  PROCEDURE:   1) US guided right common femoral artery access 2) Aortogram 3) left angiogram with third order cannulation (65mL total contrast) 4) left popliteal angioplasty (5x58mm Mustang) 5) left posterior tibial angioplasty (3x137mm Sterling) 6) Conscious sedation (64 minutes)  SURGEON:  Surgeon(s) and Role:    * Cherre Robins, MD - Primary  ASSISTANT: none  An assistant was required to facilitate exposure and expedite the case.  ANESTHESIA:   local and IV sedation  EBL: min  BLOOD ADMINISTERED:none  DRAINS: none   LOCAL MEDICATIONS USED:  LIDOCAINE   SPECIMEN:  none  COUNTS: confirmed correct.  TOURNIQUET:  none  PATIENT DISPOSITION:  PACU - hemodynamically stable.   Delay start of Pharmacological VTE agent (>24hrs) due to surgical blood loss or risk of bleeding: no  INDICATION FOR PROCEDURE: Roger Vazquez is a 70 y.o. male with ulceration of left toes in setting of non-invasive evidence of peripheral arterial disease. After careful discussion of risks, benefits, and alternatives the patient was offered angiography with intervention. We specifically discussed access site complications. The patient understood and wished to proceed.  OPERATIVE FINDINGS:  Unremarkable aortogram  Left lower extremity angiogram EIA / CFA / PFA / SFA - no flow limiting disease >65% popliteal stenosis x 2 behind the knee Severe tibial disease Anterior tibial artery is dominant and courses to the foot PT heavily diseased but appears patent to the ankle Severe pedal disease Successful endovascular intervention - 0% residual in popliteal and proximal PT  DESCRIPTION OF PROCEDURE: After identification of the patient in the pre-operative holding area, the patient was  transferred to the operating room. The patient was positioned supine on the operating room table. Anesthesia was induced. The groins was prepped and draped in standard fashion. A surgical pause was performed confirming correct patient, procedure, and operative location.  The right groin was anesthetized with subcutaneous injection of 1% lidocaine. Using ultrasound guidance, the right common femoral artery was accessed with micropuncture technique. Fluoroscopy was used to confirm cannulation over the femoral head. Sheathogram was not performed. The 3F sheath was upsized to 24F.   An 035 glidewire advantage was advanced into the distal aorta. Over the wire an omni flush catheter was advanced to the level of L2. Aortogram was performed - see above for details.   The left common iliac artery was selected with the 035 glidewire advantage. The wire was advanced into the common femoral artery. Over the wire the omni flush catheter was advanced into the external iliac artery. Selective angiography was performed - see above for details.   The decision was made to intervene. The patient was heparinized with 6000 units of heparin. The sheath was exchanged for a 5F x 55cm sheath. Selective angiography of the left lower extremity was performed prior to intervention. The lesions were treated with angioplasty. The popliteal lesion was treated with a 5x46mm Mustang. The PT lesion was treated with a 3x118mm Sterling.  Completion angiography revealed:  Resolution of stenosis  A perclose device was used to close the arteriotomy. Hemostasis was excellent upon completion.  Conscious sedation was administered with the use of IV fentanyl and midazolam under continuous physician and nurse monitoring.  Heart rate, blood pressure, and oxygen saturation were continuously monitored.  Total sedation time was 64 minutes  Upon completion  of the case instrument and sharps counts were confirmed correct. The patient was transferred  to the PACU in good condition. I was present for all portions of the procedure.  PLAN: ASA 81mg  PO QD indefnitely. Plavix 75mg  PO QD x 3 months. High intensity statin therapy indefnitely. Will check access site tomorrow.    [name]. Stanford Breed, MD [facility] Phone Number: [phone] 04/28/2021 3:49 PM

## 2021-04-29 DIAGNOSIS — A419 Sepsis, unspecified organism: Secondary | ICD-10-CM

## 2021-04-29 DIAGNOSIS — N186 End stage renal disease: Secondary | ICD-10-CM

## 2021-04-29 DIAGNOSIS — L03116 Cellulitis of left lower limb: Secondary | ICD-10-CM

## 2021-04-29 LAB — CBC
HCT: 33.3 % — ABNORMAL LOW (ref 39.0–52.0)
Hemoglobin: 11.1 g/dL — ABNORMAL LOW (ref 13.0–17.0)
MCH: 34.6 pg — ABNORMAL HIGH (ref 26.0–34.0)
MCHC: 33.3 g/dL (ref 30.0–36.0)
MCV: 103.7 fL — ABNORMAL HIGH (ref 80.0–100.0)
Platelets: 257 10*3/uL (ref 150–400)
RBC: 3.21 MIL/uL — ABNORMAL LOW (ref 4.22–5.81)
RDW: 13.8 % (ref 11.5–15.5)
WBC: 8.1 10*3/uL (ref 4.0–10.5)
nRBC: 0 % (ref 0.0–0.2)

## 2021-04-29 LAB — LIPID PANEL
Cholesterol: 84 mg/dL (ref 0–200)
HDL: 36 mg/dL — ABNORMAL LOW (ref >40)
LDL Cholesterol: 28 mg/dL (ref 0–99)
Total CHOL/HDL Ratio: 2.3 RATIO
Triglycerides: 99 mg/dL (ref <150)
VLDL: 20 mg/dL (ref 0–40)

## 2021-04-29 LAB — GLUCOSE, CAPILLARY
Glucose-Capillary: 112 mg/dL — ABNORMAL HIGH (ref 70–99)
Glucose-Capillary: 141 mg/dL — ABNORMAL HIGH (ref 70–99)
Glucose-Capillary: 153 mg/dL — ABNORMAL HIGH (ref 70–99)
Glucose-Capillary: 176 mg/dL — ABNORMAL HIGH (ref 70–99)
Glucose-Capillary: 195 mg/dL — ABNORMAL HIGH (ref 70–99)
Glucose-Capillary: 248 mg/dL — ABNORMAL HIGH (ref 70–99)

## 2021-04-29 LAB — HCV RNA QUANT
HCV Quantitative Log: 6.299 log10 IU/mL (ref >1.70)
HCV Quantitative: 1990000 IU/mL (ref >50)

## 2021-04-29 MED ORDER — INSULIN GLARGINE 100 UNIT/ML ~~LOC~~ SOLN
10.0000 [IU] | Freq: Every day | SUBCUTANEOUS | Status: DC
  Administered 2021-04-29 – 2021-05-05 (×7): 10 [IU] via SUBCUTANEOUS
  Filled 2021-04-29 (×7): qty 0.1

## 2021-04-29 MED ORDER — DOXERCALCIFEROL 4 MCG/2ML IV SOLN
2.0000 ug | INTRAVENOUS | Status: DC
  Filled 2021-04-29 (×3): qty 2

## 2021-04-29 MED ORDER — HYDROMORPHONE HCL 2 MG PO TABS
1.0000 mg | ORAL_TABLET | ORAL | Status: DC | PRN
  Administered 2021-04-29 – 2021-05-04 (×14): 1 mg via ORAL
  Filled 2021-04-29 (×15): qty 1

## 2021-04-29 MED ORDER — NEPRO/CARBSTEADY PO LIQD
237.0000 mL | Freq: Two times a day (BID) | ORAL | Status: DC
  Administered 2021-04-29 – 2021-05-05 (×12): 237 mL via ORAL

## 2021-04-29 NOTE — Consult Note (Addendum)
[name] Renal Consultation Note  Indication for Consultation:  Management of ESRD/hemodialysis; anemia, hypertension/volume and secondary hyperparathyroidism  HPI: Roger Vazquez is a 70 y.o. male with ESRD d/t DM nephropathy, started  HD 11/2015 at [facility]. Transferred to [facility] 12/12/15 , currently fairly compliant with HD - DM type 2, HTN, Hx LUL spiculated perihilar nodule (S/p CT Surgery at [facility] 12/15/15) - Hep C  Genotype I 13,900,000. not treated (tx when sober per [facility] notes) - Hx EtOH use - [facility] admit 10/22-10/28/19 with MSSA bacteremia, s/p removal LUE AVG.  Now admitted with worsening pain in left toes over the past few days he had noted he had issues with ambulation of his left foot because of pain.  Denies any nausea vomiting chills fever sweats shortness of breath.  Noted in ER febrile 100.2 tachycardic gangrenous appearing toe with drainage and surrounding cellulitis foul odor he claims left big toe started this after clipping toenails.  Noted he has been compliant with hemodialysis this past week on MWF schedule at [facility]      Past Medical History:  Diagnosis Date   Cancer [facility])    Lung   Diabetes mellitus    Hypertension    Renal disorder    MWF- [facility]    Past Surgical History:  Procedure Laterality Date   artiovenous graft placement Left    AV FISTULA PLACEMENT Right 11/27/2018   Procedure: INSERTION 4-7MM X 45CM GORETEX GRAFT RIGHT FOREARM ;  Surgeon: Rosetta Posner, MD;  Location: [place];  Service: Vascular;  Laterality: Right;   [place] REMOVAL Left 08/06/2018   Procedure: REMOVAL OF LEFT ARM GRAFT;  Surgeon: Rosetta Posner, MD;  Location: [place];  Service: Vascular;  Laterality: Left;   INSERTION OF DIALYSIS CATHETER Right 08/08/2018   Procedure: INSERTION OF TUNNELED  DIALYSIS CATHETER;  Surgeon: Marty Heck, MD;  Location: [place];  Service: Vascular;  Laterality: Right;   IR REMOVAL TUN CV CATH W/O FL  02/19/2019   LUNG SURGERY      patient not if they removed part of lung. Was done at [place] 07/31/2018   Procedure: TRANSESOPHAGEAL ECHOCARDIOGRAM (TEE);  Surgeon: Pixie Casino, MD;  Location: [facility];  Service: Cardiovascular;  Laterality: N/A;     No family history on file.    reports that he quit smoking about 21 years ago. He has a 5.00 pack-year smoking history. He quit smokeless tobacco use about 5 years ago. He reports current alcohol use of about 2.0 standard drinks of alcohol per week. He reports current drug use. Drug: Marijuana.   Allergies  Allergen Reactions   Sildenafil Other (See Comments)    Pt stated, "Makes my Blood Pressure drop"    Prior to Admission medications   Medication Sig Start Date End Date Taking? Authorizing Provider  aspirin 81 MG tablet Take 81 mg by mouth daily.     Yes [provider]  calcium acetate (PHOSLO) 667 MG capsule Take 667 mg by mouth 3 (three) times daily with meals.   Yes [provider]  calcium carbonate (OS-CAL) 1250 (500 Ca) MG chewable tablet Chew 1 tablet by mouth daily as needed for heartburn. 12/06/16  Yes [provider]  insulin aspart (NOVOLOG) 100 UNIT/ML injection Before each meal 3 times a day, 140-199 - 2 units, 200-250 - 4 units, 251-299 - 6 units,  300-349 - 8 units,  350 or above 10 units. Dispense  syringes and needles as needed, Ok to switch to PEN if approved. Substitute to any brand approved. DX DM2, Code E11.65 Patient taking differently: Inject 24 Units into the skin daily. 08/02/18  Yes Thurnell Lose, MD  insulin glargine (LANTUS) 100 UNIT/ML injection Inject 0.15 mLs (15 Units total) into the skin daily. Dispense insulin pen if approved, if not dispense as needed syringes and needles for 1 month supply. Can switch to Levemir. Diagnosis E 11.65. 08/02/18  Yes Thurnell Lose, MD  Insulin Syringe-Needle U-100 25G X 1" 1 ML MISC For 4 times a day insulin SQ, 1 month supply. Diagnosis  E11.65 08/02/18  Yes Thurnell Lose, MD  Multiple Vitamin (MULITIVITAMIN WITH MINERALS) TABS Take 1 tablet by mouth daily. 10/12/11  Yes Robbie Lis, MD  blood glucose meter kit and supplies KIT Dispense based on patient and insurance preference. Use up to four times daily as directed. (FOR ICD-9 250.00, 250.01). For QAC - HS accuchecks. 08/02/18   Thurnell Lose, MD  hydrALAZINE (APRESOLINE) 25 MG tablet Take 2 tablets (50 mg total) by mouth every 8 (eight) hours. Patient not taking: Reported on 11/20/2018 10/12/11 08/05/18  Robbie Lis, MD    I have reviewed the patient's current medications. Prior to Admission:  Medications Prior to Admission  Medication Sig Dispense Refill Last Dose   aspirin 81 MG tablet Take 81 mg by mouth daily.     04/26/2021   calcium acetate (PHOSLO) 667 MG capsule Take 667 mg by mouth 3 (three) times daily with meals.   Past Week   calcium carbonate (OS-CAL) 1250 (500 Ca) MG chewable tablet Chew 1 tablet by mouth daily as needed for heartburn.   Past Week   insulin aspart (NOVOLOG) 100 UNIT/ML injection Before each meal 3 times a day, 140-199 - 2 units, 200-250 - 4 units, 251-299 - 6 units,  300-349 - 8 units,  350 or above 10 units. Dispense syringes and needles as needed, Ok to switch to PEN if approved. Substitute to any brand approved. DX DM2, Code E11.65 (Patient taking differently: Inject 24 Units into the skin daily.) 1 vial 0 Past Week   insulin glargine (LANTUS) 100 UNIT/ML injection Inject 0.15 mLs (15 Units total) into the skin daily. Dispense insulin pen if approved, if not dispense as needed syringes and needles for 1 month supply. Can switch to Levemir. Diagnosis E 11.65. 10 mL 0 Past Week   Insulin Syringe-Needle U-100 25G X 1" 1 ML MISC For 4 times a day insulin SQ, 1 month supply. Diagnosis E11.65 30 each 0 Past Week at unknown   Multiple Vitamin (MULITIVITAMIN WITH MINERALS) TABS Take 1 tablet by mouth daily. 30 tablet 0 Past Week   blood  glucose meter kit and supplies KIT Dispense based on patient and insurance preference. Use up to four times daily as directed. (FOR ICD-9 250.00, 250.01). For QAC - HS accuchecks. 1 each 1    hydrALAZINE (APRESOLINE) 25 MG tablet Take 2 tablets (50 mg total) by mouth every 8 (eight) hours. (Patient not taking: Reported on 11/20/2018) 90 tablet 3    Scheduled:  acetaminophen  500 mg Oral Q6H   aspirin EC  81 mg Oral Daily   atorvastatin  40 mg Oral Daily   calcium acetate  667 mg Oral TID WC   Chlorhexidine Gluconate Cloth  6 each Topical Q0600   clopidogrel  75 mg Oral Daily   heparin  5,000 Units Subcutaneous Q8H   hydrALAZINE  50 mg Oral Q8H   insulin aspart  0-6 Units Subcutaneous TID WC   insulin glargine  10 Units Subcutaneous Daily   sodium chloride flush  3 mL Intravenous Q12H   Continuous:  sodium chloride     ceFEPime (MAXIPIME) IV 1 g (04/28/21 2255)   vancomycin     FYB:OFBPZW chloride, acetaminophen, hydrALAZINE, HYDROmorphone, labetalol, ondansetron (ZOFRAN) IV, sodium chloride flush Anti-infectives (From admission, onward)    Start     Dose/Rate Route Frequency Ordered Stop   04/28/21 1930  ceFEPIme (MAXIPIME) 1 g in sodium chloride 0.9 % 100 mL IVPB       See Hyperspace for full Linked Orders Report.   1 g 200 mL/hr over 30 Minutes Intravenous Every 24 hours 04/28/21 1843     04/28/21 1800  ceFEPIme (MAXIPIME) 1 g in sodium chloride 0.9 % 100 mL IVPB  Status:  Discontinued       See Hyperspace for full Linked Orders Report.   1 g 200 mL/hr over 30 Minutes Intravenous Every 24 hours 04/27/21 1406 04/28/21 1843   04/28/21 1200  vancomycin (VANCOCIN) IVPB 750 mg/150 ml premix       See Hyperspace for full Linked Orders Report.   750 mg 150 mL/hr over 60 Minutes Intravenous Every M-W-F (Hemodialysis) 04/27/21 1406     04/28/21 1021  vancomycin (VANCOREADY) 750 MG/150ML IVPB       Note to Pharmacy: Wallace Cullens   : cabinet override      04/28/21 1021 04/28/21 1037    04/27/21 1415  ceFEPIme (MAXIPIME) 2 g in sodium chloride 0.9 % 100 mL IVPB       See Hyperspace for full Linked Orders Report.   2 g 200 mL/hr over 30 Minutes Intravenous  Once 04/27/21 1406 04/27/21 1520   04/27/21 1415  vancomycin (VANCOREADY) IVPB 1250 mg/250 mL       See Hyperspace for full Linked Orders Report.   1,250 mg 166.7 mL/hr over 90 Minutes Intravenous  Once 04/27/21 1406 04/27/21 1652        Results for orders placed or performed during the hospital encounter of 04/27/21 (from the past 48 hour(s))  CBC with Differential     Status: Abnormal   Collection Time: 04/27/21 12:29 PM  Result Value Ref Range   WBC 8.9 4.0 - 10.5 K/uL   RBC 3.22 (L) 4.22 - 5.81 MIL/uL   Hemoglobin 11.3 (L) 13.0 - 17.0 g/dL   HCT 34.4 (L) 39.0 - 52.0 %   MCV 106.8 (H) 80.0 - 100.0 fL   MCH 35.1 (H) 26.0 - 34.0 pg   MCHC 32.8 30.0 - 36.0 g/dL   RDW 13.6 11.5 - 15.5 %   Platelets 295 150 - 400 K/uL   nRBC 0.0 0.0 - 0.2 %   Neutrophils Relative % 75 %   Neutro Abs 6.7 1.7 - 7.7 K/uL   Lymphocytes Relative 16 %   Lymphs Abs 1.4 0.7 - 4.0 K/uL   Monocytes Relative 7 %   Monocytes Absolute 0.7 0.1 - 1.0 K/uL   Eosinophils Relative 1 %   Eosinophils Absolute 0.1 0.0 - 0.5 K/uL   Basophils Relative 0 %   Basophils Absolute 0.0 0.0 - 0.1 K/uL   Immature Granulocytes 1 %   Abs Immature Granulocytes 0.06 0.00 - 0.07 K/uL    Comment: Performed at [facility], [address], [place], [place] [address]  Comprehensive metabolic panel     Status: Abnormal  Collection Time: 04/27/21 12:29 PM  Result Value Ref Range   Sodium 137 135 - 145 mmol/L   Potassium 3.7 3.5 - 5.1 mmol/L   Chloride 95 (L) 98 - 111 mmol/L   CO2 29 22 - 32 mmol/L   Glucose, Bld 206 (H) 70 - 99 mg/dL    Comment: Glucose reference range applies only to samples taken after fasting for at least 8 hours.   BUN 23 8 - 23 mg/dL   Creatinine, Ser 7.45 (H) 0.61 - 1.24 mg/dL   Calcium 8.9 8.9 - 10.3 mg/dL   Total  Protein 8.5 (H) 6.5 - 8.1 g/dL   Albumin 3.6 3.5 - 5.0 g/dL   AST 10 (L) 15 - 41 U/L   ALT 9 0 - 44 U/L   Alkaline Phosphatase 88 38 - 126 U/L   Total Bilirubin 0.4 0.3 - 1.2 mg/dL   GFR, Estimated 7 (L) >60 mL/min    Comment: (NOTE) Calculated using the CKD-EPI Creatinine Equation (2021)    Anion gap 13 5 - 15    Comment: Performed at [facility] [address], [place], [place] [address]  Lactic acid, plasma     Status: None   Collection Time: 04/27/21 12:29 PM  Result Value Ref Range   Lactic Acid, Venous 1.4 0.5 - 1.9 mmol/L    Comment: Performed at [facility] [address], [place], [place] [address]  Protime-INR     Status: None   Collection Time: 04/27/21 12:29 PM  Result Value Ref Range   Prothrombin Time 12.9 11.4 - 15.2 seconds   INR 1.0 0.8 - 1.2    Comment: (NOTE) INR goal varies based on device and disease states. Performed at [facility], [place] [address] [place], [place] [address]   Blood culture (routine x 2)     Status: None (Preliminary result)   Collection Time: 04/27/21 12:40 PM   Specimen: BLOOD LEFT ARM  Result Value Ref Range   Specimen Description BLOOD LEFT ARM    Special Requests      BOTTLES DRAWN AEROBIC AND ANAEROBIC Blood Culture adequate volume   Culture      NO GROWTH < 24 HOURS Performed at [facility], [place] [address] [place], [place] [address]    Report Status PENDING   Resp Panel by RT-PCR (Flu A&B, Covid) Nasopharyngeal Swab     Status: None   Collection Time: 04/27/21  2:45 PM   Specimen: Nasopharyngeal Swab; Nasopharyngeal(NP) swabs in vial transport medium  Result Value Ref Range   SARS Coronavirus 2 by RT PCR NEGATIVE NEGATIVE    Comment: (NOTE) SARS-CoV-2 target nucleic acids are NOT DETECTED.  The SARS-CoV-2 RNA is generally detectable in upper respiratory specimens during the acute phase of infection. The lowest concentration of SARS-CoV-2 viral copies this assay can detect is 138 copies/mL. A  negative result does not preclude SARS-Cov-2 infection and should not be used as the sole basis for treatment or other patient management decisions. A negative result may occur with  improper specimen collection/handling, submission of specimen other than nasopharyngeal swab, presence of viral mutation(s) within the areas targeted by this assay, and inadequate number of viral copies(<138 copies/mL). A negative result must be combined with clinical observations, patient history, and epidemiological information. The expected result is Negative.  Fact Sheet for Patients:  EntrepreneurPulse.com.au  Fact Sheet for Healthcare Providers:  IncredibleEmployment.be  This test is no t yet approved or cleared by  the [employer] and  has been authorized for detection and/or diagnosis of SARS-CoV-2 by FDA under an Emergency Use Authorization (EUA). This EUA will remain  in effect (meaning this test can be used) for the duration of the COVID-19 declaration under Section 564(b)(1) of the Act, 21 U.S.C.section 360bbb-3(b)(1), unless the authorization is terminated  or revoked sooner.       Influenza A by PCR NEGATIVE NEGATIVE   Influenza B by PCR NEGATIVE NEGATIVE    Comment: (NOTE) The Xpert Xpress SARS-CoV-2/FLU/RSV plus assay is intended as an aid in the diagnosis of influenza from Nasopharyngeal swab specimens and should not be used as a sole basis for treatment. Nasal washings and aspirates are unacceptable for Xpert Xpress SARS-CoV-2/FLU/RSV testing.  Fact Sheet for Patients: EntrepreneurPulse.com.au  Fact Sheet for Healthcare Providers: IncredibleEmployment.be  This test is not yet approved or cleared by the [place] FDA and has been authorized for detection and/or diagnosis of SARS-CoV-2 by FDA under an Emergency Use Authorization (EUA). This EUA will remain in effect (meaning this test can be used)  for the duration of the COVID-19 declaration under Section 564(b)(1) of the Act, 21 U.S.C. section 360bbb-3(b)(1), unless the authorization is terminated or revoked.  Performed at [facility], [place] [address], [place], [place] [address]   Hemoglobin A1c     Status: Abnormal   Collection Time: 04/27/21  4:56 PM  Result Value Ref Range   Hgb A1c MFr Bld 7.9 (H) 4.8 - 5.6 %    Comment: (NOTE) Pre diabetes:          5.7%-6.4%  Diabetes:              >6.4%  Glycemic control for   <7.0% adults with diabetes    Mean Plasma Glucose 180.03 mg/dL    Comment: Performed at [facility] [address], [place], [place] [address]  Glucose, capillary     Status: None   Collection Time: 04/27/21  5:41 PM  Result Value Ref Range   Glucose-Capillary 96 70 - 99 mg/dL    Comment: Glucose reference range applies only to samples taken after fasting for at least 8 hours.  Surgical pcr screen     Status: None   Collection Time: 04/27/21  8:07 PM   Specimen: Nasal Mucosa; Nasal Swab  Result Value Ref Range   MRSA, PCR NEGATIVE NEGATIVE   Staphylococcus aureus NEGATIVE NEGATIVE    Comment: (NOTE) The Xpert SA Assay (FDA approved for NASAL specimens in patients 74 years of age and older), is one component of a comprehensive surveillance program. It is not intended to diagnose infection nor to guide or monitor treatment. Performed at [facility], [place] [address] [place], [place] [address]   Glucose, capillary     Status: Abnormal   Collection Time: 04/27/21  8:49 PM  Result Value Ref Range   Glucose-Capillary 118 (H) 70 - 99 mg/dL    Comment: Glucose reference range applies only to samples taken after fasting for at least 8 hours.  Urinalysis, Routine w reflex microscopic Urine, Clean Catch     Status: Abnormal   Collection Time: 04/27/21  8:56 PM  Result Value Ref Range   Color, Urine YELLOW YELLOW   APPearance CLOUDY (A) CLEAR   Specific Gravity, Urine 1.012 1.005 - 1.030    pH 8.0 5.0 - 8.0   Glucose, UA 50 (A) NEGATIVE mg/dL   Hgb urine dipstick NEGATIVE NEGATIVE   Bilirubin  Urine NEGATIVE NEGATIVE   Ketones, ur NEGATIVE NEGATIVE mg/dL   Protein, ur 100 (A) NEGATIVE mg/dL   Nitrite NEGATIVE NEGATIVE   Leukocytes,Ua LARGE (A) NEGATIVE   RBC / HPF 6-10 0 - 5 RBC/hpf   WBC, UA >50 (H) 0 - 5 WBC/hpf   Bacteria, UA NONE SEEN NONE SEEN   Squamous Epithelial / LPF 11-20 0 - 5   Non Squamous Epithelial 0-5 (A) NONE SEEN    Comment: Performed at [facility], [place] [address] [place], [place] [address]  Glucose, capillary     Status: Abnormal   Collection Time: 04/28/21 12:07 AM  Result Value Ref Range   Glucose-Capillary 192 (H) 70 - 99 mg/dL    Comment: Glucose reference range applies only to samples taken after fasting for at least 8 hours.  Comprehensive metabolic panel     Status: Abnormal   Collection Time: 04/28/21  2:02 AM  Result Value Ref Range   Sodium 136 135 - 145 mmol/L   Potassium 3.8 3.5 - 5.1 mmol/L   Chloride 99 98 - 111 mmol/L   CO2 28 22 - 32 mmol/L   Glucose, Bld 168 (H) 70 - 99 mg/dL    Comment: Glucose reference range applies only to samples taken after fasting for at least 8 hours.   BUN 28 (H) 8 - 23 mg/dL   Creatinine, Ser 7.97 (H) 0.61 - 1.24 mg/dL   Calcium 8.6 (L) 8.9 - 10.3 mg/dL   Total Protein 7.3 6.5 - 8.1 g/dL   Albumin 3.0 (L) 3.5 - 5.0 g/dL   AST 7 (L) 15 - 41 U/L   ALT 9 0 - 44 U/L   Alkaline Phosphatase 74 38 - 126 U/L   Total Bilirubin 0.3 0.3 - 1.2 mg/dL   GFR, Estimated 7 (L) >60 mL/min    Comment: (NOTE) Calculated using the CKD-EPI Creatinine Equation (2021)    Anion gap 9 5 - 15    Comment: Performed at [facility], [place] [address] [place], [place] [address]  CBC     Status: Abnormal   Collection Time: 04/28/21  2:02 AM  Result Value Ref Range   WBC 7.5 4.0 - 10.5 K/uL   RBC 2.99 (L) 4.22 - 5.81 MIL/uL   Hemoglobin 10.3 (L) 13.0 - 17.0 g/dL   HCT 32.1 (L) 39.0 - 52.0 %   MCV 107.4 (H)  80.0 - 100.0 fL   MCH 34.4 (H) 26.0 - 34.0 pg   MCHC 32.1 30.0 - 36.0 g/dL   RDW 13.8 11.5 - 15.5 %   Platelets 254 150 - 400 K/uL   nRBC 0.0 0.0 - 0.2 %    Comment: Performed at [facility], [place] [address], [place], [place] [address]  Glucose, capillary     Status: Abnormal   Collection Time: 04/28/21  3:31 AM  Result Value Ref Range   Glucose-Capillary 137 (H) 70 - 99 mg/dL    Comment: Glucose reference range applies only to samples taken after fasting for at least 8 hours.  Glucose, capillary     Status: Abnormal   Collection Time: 04/28/21  7:32 AM  Result Value Ref Range   Glucose-Capillary 120 (H) 70 - 99 mg/dL    Comment: Glucose reference range applies only to samples taken after fasting for at least 8 hours.  Hepatitis B surface antigen     Status: None   Collection Time: 04/28/21  9:16 AM  Result Value Ref  Range   Hepatitis B Surface Ag NON REACTIVE NON REACTIVE    Comment: Performed at [facility], [place] [address], [place], [place] [address]  Glucose, capillary     Status: Abnormal   Collection Time: 04/28/21 12:33 PM  Result Value Ref Range   Glucose-Capillary 120 (H) 70 - 99 mg/dL    Comment: Glucose reference range applies only to samples taken after fasting for at least 8 hours.  POCT Activated clotting time     Status: None   Collection Time: 04/28/21  3:46 PM  Result Value Ref Range   Activated Clotting Time 254 seconds  Glucose, capillary     Status: Abnormal   Collection Time: 04/28/21  6:28 PM  Result Value Ref Range   Glucose-Capillary 115 (H) 70 - 99 mg/dL    Comment: Glucose reference range applies only to samples taken after fasting for at least 8 hours.  Glucose, capillary     Status: Abnormal   Collection Time: 04/28/21  8:33 PM  Result Value Ref Range   Glucose-Capillary 147 (H) 70 - 99 mg/dL    Comment: Glucose reference range applies only to samples taken after fasting for at least 8 hours.  Glucose, capillary     Status: Abnormal    Collection Time: 04/29/21 12:13 AM  Result Value Ref Range   Glucose-Capillary 195 (H) 70 - 99 mg/dL    Comment: Glucose reference range applies only to samples taken after fasting for at least 8 hours.  CBC     Status: Abnormal   Collection Time: 04/29/21  2:01 AM  Result Value Ref Range   WBC 8.1 4.0 - 10.5 K/uL   RBC 3.21 (L) 4.22 - 5.81 MIL/uL   Hemoglobin 11.1 (L) 13.0 - 17.0 g/dL   HCT 33.3 (L) 39.0 - 52.0 %   MCV 103.7 (H) 80.0 - 100.0 fL   MCH 34.6 (H) 26.0 - 34.0 pg   MCHC 33.3 30.0 - 36.0 g/dL   RDW 13.8 11.5 - 15.5 %   Platelets 257 150 - 400 K/uL   nRBC 0.0 0.0 - 0.2 %    Comment: Performed at [facility], [place] [address], [place], [place] [address]  Lipid panel     Status: Abnormal   Collection Time: 04/29/21  2:01 AM  Result Value Ref Range   Cholesterol 84 0 - 200 mg/dL   Triglycerides 99 <150 mg/dL   HDL 36 (L) >40 mg/dL   Total CHOL/HDL Ratio 2.3 RATIO   VLDL 20 0 - 40 mg/dL   LDL Cholesterol 28 0 - 99 mg/dL    Comment:        Total Cholesterol/HDL:CHD Risk Coronary Heart Disease Risk Table                     Men   Women  1/2 Average Risk   3.4   3.3  Average Risk       5.0   4.4  2 X Average Risk   9.6   7.1  3 X Average Risk  23.4   11.0        Use the calculated Patient Ratio above and the CHD Risk Table to determine the patient's CHD Risk.        ATP III CLASSIFICATION (LDL):  <100     mg/dL   Optimal  100-129  mg/dL   Near or Above  Optimal  130-159  mg/dL   Borderline  160-189  mg/dL   High  >190     mg/dL   Very High Performed at [facility] [address], [place], [place] [address]   Glucose, capillary     Status: Abnormal   Collection Time: 04/29/21  5:13 AM  Result Value Ref Range   Glucose-Capillary 176 (H) 70 - 99 mg/dL    Comment: Glucose reference range applies only to samples taken after fasting for at least 8 hours.  Glucose, capillary     Status: Abnormal   Collection Time: 04/29/21  7:47 AM   Result Value Ref Range   Glucose-Capillary 153 (H) 70 - 99 mg/dL    Comment: Glucose reference range applies only to samples taken after fasting for at least 8 hours.  .  ROS: See HPI   Physical Exam: Vitals:   04/29/21 0511 04/29/21 0749  BP: (!) 143/75 (!) 156/82  Pulse: 84 85  Resp: 16 16  Temp: 98.2 F (36.8 C) 98.1 F (36.7 C)  SpO2: 99% 100%     General: Alert chronically ill somewhat thin elderly male appropriate NAD HEENT: [place], EOMI, PERRLA, nonicteric Neck: Supple, no JVD Heart: RRR no MRG appreciated Lungs: CTA nonlabored breathing Abdomen: BS positive, NT ND, no ascites Extremities: No pedal edema Skin: Left foot second toe gangrenous appearance dry foul odor Neuro: Alert O x3, moves all extremities independently, no acute focal deficits appreciated Dialysis Access: Positive bruit R FA AVG  OP HD Orders: [facility] MWF 3hrs , EDW 60.5 kg , RFA AVGG 2K 3Ca Hect 2 qHD Hep 4000unit bolus Venofer 8m qHD Mircera 343m q2wks - last 7/13  Assessment/Plan  Gangrenous second toe left foot PAD = postop day 1 from an angiogram with left popliteal left posterior tibial angioplasty pain improved, on cefepime and vancomycin ,VVS following, pain control per admit team ESRD -HD on MWF schedule, no current HD needs today Next HD on Monday 7/18 Hypertension/volume  -on hydralazine 50 mg every 8 hours, no excess volume on exam 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

## 2021-04-29 NOTE — Progress Notes (Signed)
Family Medicine Teaching Service Daily Progress Note Intern Pager: [phone]  Patient name: Roger Vazquez Medical record number: [id number] Date of birth: 1951-06-25 Age: 70 y.o. Gender: male  Primary Care Provider: [facility] Consultants: Vascular Surgery Code Status: Full  Pt Overview and Major Events to Date:  7/14- Admitted 7/15- Arteriogram with VVS  Assessment and Plan: Mr. Borkenhagen is a 70 year old man with a history of type 2 diabetes, hypertension, ESRD on HD, lung cancer, and hepatitis C who presented with gangrene of the left second toe and ulceration of the left first toe.  Now postop day 1 after angiogram and multivessel angioplasty of the left lower extremity.  Gangrenous Second Toe of Left Foot PAD Postop day 1 from angiogram with left popliteal and left posterior tibial angioplasty. Pain is well-controlled. Has been afebrile since surgery.  Blood cultures with no growth at 24 hours.  Remains without a white blood count.  LDL 28. Total cholesterol 84. -Continue cefepime and vancomycin -VVS following, appreciate their recommendations -Continue to follow blood culture. -Continue scheduled Tylenol 500 mg every 6 hours -Dilaudid 1 mg PO every 4 hours as needed for pain -Continue aspirin and Plavix -Continue atorvastatin 40 mg daily  ESRD on HD MWF Dialyzed on 7/15.  Nephro following. -HD per nephro -Continue PhosLo  Diabetes Type 2 Had been n.p.o. for surgery, had been holding his Lantus.  Recent glucoses 115-195. - Will restart Lantus 15u daily now that he is no longer NPO  -Continue CBGs -Continue SSI  History of EtOH abuse No evidence of withdrawal at this time. -Follow CIWA -We will add Ativan if needed  Hypertension Last BP 131/85. -Continue hydralazine 50 mg every 8 hours  FEN/GI: Renal/carb modified with 1200 mL fluid restriction PPx: Subcu heparin Dispo:Home pending clinical improvement . Barriers include possible need for further  surgery.   Subjective:  Mr. Richeson reports that he feels "so much better" after his angioplasty yesterday. Feels that he can walk better. Reports his pain is well-controlled. No nausea, vomiting, chills.   Objective: Temp:  [98.1 F (36.7 C)-98.8 F (37.1 C)] 98.2 F (36.8 C) (07/16 0511) Pulse Rate:  [78-112] 84 (07/16 0511) Resp:  [4-27] 16 (07/16 0511) BP: (127-185)/(65-103) 143/75 (07/16 0511) SpO2:  [0 %-100 %] 99 % (07/16 0511) Weight:  [60.1 kg-61.1 kg] 60.1 kg (07/15 1139) Physical Exam: General: Well-appearing older gentleman lying in bed watching television, NAD Cardiovascular: Regular rate and rhythm, transmitted AV fistula sounds Respiratory: Breathing comfortably on RA, no aberrant lung sounds Abdomen: Soft, non-distended, non-tender Extremities: LLE with 1+ DP & PT pulses, necrotic second toe and ulceration of third toe without drainage. Second toe TTP, but the remainder of the foot is non-tender.   Laboratory: Recent Labs  Lab 04/27/21 1229 04/28/21 0202 04/29/21 0201  WBC 8.9 7.5 8.1  HGB 11.3* 10.3* 11.1*  HCT 34.4* 32.1* 33.3*  PLT 295 254 257   Recent Labs  Lab 04/27/21 1229 04/28/21 0202  NA 137 136  K 3.7 3.8  CL 95* 99  CO2 29 28  BUN 23 28*  CREATININE 7.45* 7.97*  CALCIUM 8.9 8.6*  PROT 8.5* 7.3  BILITOT 0.4 0.3  ALKPHOS 88 74  ALT 9 9  AST 10* 7*  GLUCOSE 206* 168*    Lipid panel pending  Imaging/Diagnostic Tests: ABIs noncompressible bilaterally  Eppie Gibson, MD 04/29/2021, 5:45 AM PGY-1, [place] Intern pager: [phone], text pages welcome

## 2021-04-29 NOTE — Progress Notes (Signed)
Family Medicine Teaching Service Daily Progress Note Intern Pager: [phone]  Patient name: Roger Vazquez Medical record number: [id number] Date of birth: 1951-09-02 Age: 70 y.o. Gender: male  Primary Care Provider: [facility] Consultants: Vascular surgery, nephrology Code Status: Full  Pt Overview and Major Events to Date:  7/14-admit 7/15-arteriogram of the VS  Assessment and Plan: [name] is a 70 year old man with a history of type 2 diabetes, hypertension, ESRD on HD, lung cancer, and hepatitis C who presented with gangrene of the left second toe and ulceration of the left first toe.  Now postop day 1 after angiogram and multivessel angioplasty of the left lower extremity.   Gangrenous Second Toe of Left Foot PAD POD #2 s/p left popliteal and posterior tibial angioplasty.  Blood cultures with NGTD.  He is complaining of left mid calf pain that is intermittent and cramping with occasional pain in the toe.  Consideration that this could be related to worsening PAD versus muscle spasms.  At this time, we will await vascular's recommendations to further address.  Reassuringly on physical exam pulses (though difficult to obtain) were still palpable and foot appears stable compared to photos of prior. Patient just had angioplasty and is on heparin and feel DVT is not the most likely diagnosis and physical exam otherwise reassuring besides tenderness. -Continue cefepime and vancomycin -VVS following, appreciate recs -Continue to follow blood culture. -Pain regimen          Scheduled Tylenol 500 mg every 6 hours          Dilaudid 1 mg PO q3h PRN -Continue aspirin and Plavix -Continue atorvastatin 40 mg daily -Consider adding heating pad if appropriate, avoid ice for concern for vasoconstriction   ESRD on HD MWF Last session on 7/15, anticipate session on 7/18 if still hospitalized. -HD per nephro -Continue PhosLo   Diabetes Type 2 CBGs in the last 24 112-248. Patient  received total of 1U Novolog in the last 24h -Continue Lantus 10U daily -Continue CBGs -Continue SSI   History of EtOH abuse No evidence of withdrawal at this time. -Discontinuing CIWA's   Hypertension BP range last 24 hours 107-157/58-109, most recently 132/74. -Continue hydralazine 50 mg every 8 hours -As needed labetalol and separate dose of hydralazine ordered previously as part of post cath order set, will discontinue at this time   FEN/GI:  renal/carb modified with 1200 mL fluid restriction PPx: Subcu heparin Dispo:Home pending clinical improvement . Barriers include possible need for further surgery.  Subjective:  Patient reports that he is having left calf pain that is intermittent and he describes it as cramping.  Feels that the Dilaudid and Tylenol might be helping but cannot really tell right now.  States that he feels confused regarding the leg pain.  He does also occasionally have shooting pain down to this toe. Nursing did report that patient requested eyedrops that he takes at home.  Cannot see any on his history medications.  We will discussed with pharmacy and if no prescription then we will consider adding the lubricating drops.  Objective: Temp:  [98.1 F (36.7 C)-98.6 F (37 C)] 98.1 F (36.7 C) (07/16 1951) Pulse Rate:  [83-89] 89 (07/16 1951) Resp:  [15-16] 15 (07/16 1951) BP: (107-157)/(58-109) 107/58 (07/16 1951) SpO2:  [99 %-100 %] 100 % (07/16 1951) Physical Exam: General: NAD, sitting up in bed, 70 year old male Cardiovascular: RRR Respiratory: Breathing abdomen room air, no increased work of breathing, clear to auscultation bilaterally Abdomen: Soft, nontender, nondistended  Extremities: Left lower extremity with 1+ DP pulses, necrotic second with third toe ulceration.  Second no TTP.  Left mid calf tender to palpation without signs of swelling or erythema at this time   Laboratory: Recent Labs  Lab 04/28/21 0202 04/29/21 0201 04/30/21 0109  WBC 7.5  8.1 6.5  HGB 10.3* 11.1* 10.4*  HCT 32.1* 33.3* 31.2*  PLT 254 257 260   Recent Labs  Lab 04/27/21 1229 04/28/21 0202  NA 137 136  K 3.7 3.8  CL 95* 99  CO2 29 28  BUN 23 28*  CREATININE 7.45* 7.97*  CALCIUM 8.9 8.6*  PROT 8.5* 7.3  BILITOT 0.4 0.3  ALKPHOS 88 74  ALT 9 9  AST 10* 7*  GLUCOSE 206* 168*     Imaging/Diagnostic Tests: No results found.   [name], DO 04/30/2021, 5:42 AM PGY-2, [place] Intern pager: [phone], text pages welcome

## 2021-04-29 NOTE — Progress Notes (Signed)
[facility] [place]  Subjective  -no complaints   Objective (!) 156/82 85 98.1 F (36.7 C) (Oral) 16 100%  Intake/Output Summary (Last 24 hours) at 04/29/2021 1030 Last data filed at 04/29/2021 1000 Gross per 24 hour  Intake 150 ml  Output 1000 ml  Net -850 ml    Right groin with no hematoma Left PT brisk by Doppler  Laboratory Lab Results: Recent Labs    04/28/21 0202 04/29/21 0201  WBC 7.5 8.1  HGB 10.3* 11.1*  HCT 32.1* 33.3*  PLT 254 257   BMET Recent Labs    04/27/21 1229 04/28/21 0202  NA 137 136  K 3.7 3.8  CL 95* 99  CO2 29 28  GLUCOSE 206* 168*  BUN 23 28*  CREATININE 7.45* 7.97*  CALCIUM 8.9 8.6*    COAG Lab Results  Component Value Date   INR 1.0 04/27/2021   INR 1.08 07/31/2018   INR 0.99 07/27/2018   No results found for: PTT  Assessment/Planning:  Postop day 1 status post left popliteal and posterior tibial angioplasty for CLI with tissue loss.  Groin site looks good.  He has a brisk PT signal in the left foot after revascularization.  Continue aspirin statin Plavix from a vascular surgery standpoint.  Marty Heck 04/29/2021 10:30 AM --

## 2021-04-29 NOTE — Progress Notes (Signed)
FPTS Brief Progress Note  S: Patient sitting in bed talking with nurse when out of the room.  Reports that he is having worsening pain in his left foot.  Just received his nighttime dose of Dilaudid.    O: BP (!) 107/58 (BP Location: Left Arm)   Pulse 89   Temp 98.1 F (36.7 C) (Oral)   Resp 15   Ht 5\' 6"  (1.676 m)   Wt 60.1 kg   SpO2 100%   BMI 21.39 kg/m   General: Resting in bed, complaining of pain in his left foot Respiratory: Normal work of breathing, lungs clear to auscultation bilaterally  A/P: Gangrenous second toe of left foot - Postop day 1 from angiogram and angioplasty.  Pain currently not well controlled although just received dose of pain medications, nurse will let team know if not well controlled after medication kicks in - Continue Dilaudid 2 mg every 3 hours as needed - Continue cefepime and vancomycin - Continue aspirin and Plavix - Orders reviewed. Labs for AM ordered, which was adjusted as needed.    Gifford Shave, MD 04/29/2021, 11:33 PM PGY-3, [facility] Night Resident  Please page [phone] with questions.

## 2021-04-30 ENCOUNTER — Inpatient Hospital Stay (HOSPITAL_COMMUNITY): Payer: Medicare HMO

## 2021-04-30 DIAGNOSIS — M79605 Pain in left leg: Secondary | ICD-10-CM | POA: Diagnosis not present

## 2021-04-30 DIAGNOSIS — I96 Gangrene, not elsewhere classified: Secondary | ICD-10-CM | POA: Diagnosis not present

## 2021-04-30 DIAGNOSIS — A419 Sepsis, unspecified organism: Secondary | ICD-10-CM | POA: Diagnosis not present

## 2021-04-30 LAB — BASIC METABOLIC PANEL
Anion gap: 14 (ref 5–15)
BUN: 36 mg/dL — ABNORMAL HIGH (ref 8–23)
CO2: 24 mmol/L (ref 22–32)
Calcium: 9 mg/dL (ref 8.9–10.3)
Chloride: 95 mmol/L — ABNORMAL LOW (ref 98–111)
Creatinine, Ser: 7.79 mg/dL — ABNORMAL HIGH (ref 0.61–1.24)
GFR, Estimated: 7 mL/min — ABNORMAL LOW (ref >60)
Glucose, Bld: 196 mg/dL — ABNORMAL HIGH (ref 70–99)
Potassium: 4.7 mmol/L (ref 3.5–5.1)
Sodium: 133 mmol/L — ABNORMAL LOW (ref 135–145)

## 2021-04-30 LAB — GLUCOSE, CAPILLARY
Glucose-Capillary: 169 mg/dL — ABNORMAL HIGH (ref 70–99)
Glucose-Capillary: 206 mg/dL — ABNORMAL HIGH (ref 70–99)
Glucose-Capillary: 217 mg/dL — ABNORMAL HIGH (ref 70–99)
Glucose-Capillary: 224 mg/dL — ABNORMAL HIGH (ref 70–99)
Glucose-Capillary: 83 mg/dL (ref 70–99)

## 2021-04-30 LAB — CBC
HCT: 31.2 % — ABNORMAL LOW (ref 39.0–52.0)
Hemoglobin: 10.4 g/dL — ABNORMAL LOW (ref 13.0–17.0)
MCH: 34.9 pg — ABNORMAL HIGH (ref 26.0–34.0)
MCHC: 33.3 g/dL (ref 30.0–36.0)
MCV: 104.7 fL — ABNORMAL HIGH (ref 80.0–100.0)
Platelets: 260 10*3/uL (ref 150–400)
RBC: 2.98 MIL/uL — ABNORMAL LOW (ref 4.22–5.81)
RDW: 13.8 % (ref 11.5–15.5)
WBC: 6.5 10*3/uL (ref 4.0–10.5)
nRBC: 0 % (ref 0.0–0.2)

## 2021-04-30 MED ORDER — POLYVINYL ALCOHOL 1.4 % OP SOLN
1.0000 [drp] | OPHTHALMIC | Status: DC | PRN
  Filled 2021-04-30: qty 15

## 2021-04-30 MED ORDER — CEPHALEXIN 500 MG PO CAPS
500.0000 mg | ORAL_CAPSULE | Freq: Every day | ORAL | Status: AC
  Administered 2021-04-30 – 2021-05-02 (×3): 500 mg via ORAL
  Filled 2021-04-30 (×3): qty 1

## 2021-04-30 MED ORDER — POLYETHYLENE GLYCOL 3350 17 G PO PACK
17.0000 g | PACK | Freq: Every day | ORAL | Status: DC
  Administered 2021-04-30 – 2021-05-02 (×3): 17 g via ORAL
  Filled 2021-04-30 (×3): qty 1

## 2021-04-30 NOTE — Progress Notes (Signed)
Subjective: Seen in room eating breakfast reports some pain in his left calf and mildly better and left foot, noted admit team dosing pain med regimen  Objective Vital signs in last 24 hours: Vitals:   04/29/21 1428 04/29/21 1951 04/30/21 0654 04/30/21 0800  BP: (!) 157/74 (!) 107/58 132/74 (!) 143/101  Pulse: 83 89 90   Resp: 16 15 16    Temp: 98.6 F (37 C) 98.1 F (36.7 C) 97.9 F (36.6 C)   TempSrc: Oral Oral Oral   SpO2: 99% 100% 99%   Weight:      Height:       Weight change:   Physical Exam: General: Alert chronically ill appearing elderly male appropriate NAD Heart: RRR no MRG appreciated Lungs: CTA nonlabored breathing Abdomen: BS positive, NT ND, no ascites Extremities: No pedal edema Skin: Left foot second toe gangrenous appearance dry  Dialysis Access: Positive bruit R FA AVG   OP HD Orders: [facility] MWF 3hrs , EDW 60.5 kg , RFA AVGG 2K 3Ca Hect 2 qHD Hep 4000unit bolus Venofer 40mg  qHD Mircera 40mcg q2wks - last 7/13     Problem/Plan: Gangrenous second toe left foot PAD = SP  Dr  Carlis Abbott =angiogram with left popliteal left posterior tibial angioplasty pain improved, on cefepime and vancomycin per admit team,VVS following, pain control per admit team ESRD -HD on MWF schedule, no current HD needs today Next HD on Monday 7/18 Hypertension/volume  -on hydralazine 50 mg every 8 hours, no excess volume on exam BP fairly stable may be with some pain today would not change medication regimen yet unless remains elevated Anemia of ESRD-Hgb 11.1,> 10.4 last given Mircera 30 on 7/13, no ESA issues  hold weekly iron with infection process Metabolic bone disease -continue Hectorol on dialysis days phosphate binder, current calcium level stable follow-up calcium phosphorus labs Nutrition -diet should be renal/carb modified albumin 3.0  ,on  Nepro supplement History of diabetes mellitus type 2 plan per admit team History of EtOH and polysubstance abuse in chart= plan per admit  team History of hep C plan per teamGeneral:   Ernest Haber, PA-C [place] [phone] 04/30/2021,11:10 AM  LOS: 2 days   Labs: Basic Metabolic Panel: Recent Labs  Lab 04/27/21 1229 04/28/21 0202 04/30/21 0827  NA 137 136 133*  K 3.7 3.8 4.7  CL 95* 99 95*  CO2 29 28 24   GLUCOSE 206* 168* 196*  BUN 23 28* 36*  CREATININE 7.45* 7.97* 7.79*  CALCIUM 8.9 8.6* 9.0   Liver Function Tests: Recent Labs  Lab 04/27/21 1229 04/28/21 0202  AST 10* 7*  ALT 9 9  ALKPHOS 88 74  BILITOT 0.4 0.3  PROT 8.5* 7.3  ALBUMIN 3.6 3.0*   No results for input(s): LIPASE, AMYLASE in the last 168 hours. No results for input(s): AMMONIA in the last 168 hours. CBC: Recent Labs  Lab 04/27/21 1229 04/28/21 0202 04/29/21 0201 04/30/21 0109  WBC 8.9 7.5 8.1 6.5  NEUTROABS 6.7  --   --   --   HGB 11.3* 10.3* 11.1* 10.4*  HCT 34.4* 32.1* 33.3* 31.2*  MCV 106.8* 107.4* 103.7* 104.7*  PLT 295 254 257 260   Cardiac Enzymes: No results for input(s): CKTOTAL, CKMB, CKMBINDEX, TROPONINI in the last 168 hours. CBG: Recent Labs  Lab 04/29/21 1204 04/29/21 1625 04/29/21 1954 04/30/21 0009 04/30/21 0753  GLUCAP 141* 112* 248* 169* 224*    Studies/Results: No results found. Medications:  sodium chloride  ceFEPime (MAXIPIME) IV 1 g (04/29/21 2211)   vancomycin      acetaminophen  500 mg Oral Q6H   aspirin EC  81 mg Oral Daily   atorvastatin  40 mg Oral Daily   calcium acetate  667 mg Oral TID WC   Chlorhexidine Gluconate Cloth  6 each Topical Q0600   clopidogrel  75 mg Oral Daily   [START ON 05/01/2021] doxercalciferol  2 mcg Intravenous Q M,W,F-HD   feeding supplement (NEPRO CARB STEADY)  237 mL Oral BID BM   heparin  5,000 Units Subcutaneous Q8H   hydrALAZINE  50 mg Oral Q8H   insulin aspart  0-6 Units Subcutaneous TID WC   insulin glargine  10 Units Subcutaneous Daily   sodium chloride flush  3 mL Intravenous Q12H

## 2021-04-30 NOTE — Progress Notes (Signed)
FPTS Interim Progress Note  S: Called to patient room by nurse regarding patient having significant toe pain.  She reported that when she had the weekend to give him his medications he complains of significant toe pain.  Did not feel that the Dilaudid or Tylenol were really helping the pain at this time.  Pain does not appear to awaken the patient, but he complains of it once he is woken up.  Patient was asleep when I walked into the room.  O: BP (!) 107/58 (BP Location: Left Arm)   Pulse 89   Temp 98.1 F (36.7 C) (Oral)   Resp 15   Ht 5\' 6"  (1.676 m)   Wt 60.1 kg   SpO2 100%   BMI 21.39 kg/m   General: NAD, sleeping propped upright, appears comfortable while asleep Respiratory: No increased WOB, breathing comfortably on room air  A/P: Gangrenous second left toe - Pain not well controlled per nursing, patient asleep in the room.  Pain does not wake the patient from sleep and was sleeping comfortably, we will try to minimize the number of times the patient is awoken and will reevaluate if he calls out again regarding pain. - Continue Dilaudid 1 mg every 3 hours as needed, if this improves the pain we can consider increasing to 2 mg. - Continue cefepime and vancomycin - Continue aspirin and Plavix  Ivannia Willhelm, DO 04/30/2021, 1:04 AM PGY-2, [place] Medicine Service pager [phone]

## 2021-04-30 NOTE — Progress Notes (Signed)
VASCULAR LAB    Left lower extremity venous duplex has been performed.  See CV proc for preliminary results.   Cherika Jessie, RVT 04/30/2021, 3:15 PM

## 2021-04-30 NOTE — Progress Notes (Signed)
FPTS Brief Progress Note  S:Roger Vazquez is laying supine in bed. He said he's experiencing pain on his second left toe. Patient rates the pain 7.5 out of 10 and expressed that this usually happens closer to the time for his night medication which is in an hour. He denies any nausea.   O: BP 138/77 (BP Location: Left Arm)   Pulse 92   Temp 98.2 F (36.8 C) (Oral)   Resp 16   Ht 5\' 6"  (1.676 m)   Wt 60.1 kg   SpO2 100%   BMI 21.39 kg/m   General: Alert, laying supine in bed and NAD Respiration: Breathing comfortably on room Air, clear breath sounds bilaterally   A/P: Second left toe Gangrene Patient reported his pain is currently 7.5 out of 10. He said his pain med usually improve the pain and is scheduled to take the next medication in an hour.  -continue dilaudid 1 mg q3h for pain control -continue aspirin and plavix -continue cefepime and vancomycin  Alen Bleacher, MD 04/30/2021, 10:22 PM PGY-1, [facility] Family [facility] Night Resident  Please page [phone] with questions.

## 2021-04-30 NOTE — Progress Notes (Signed)
Patient continues to complain of severe pain in his left toe (2nd digit).   Patient rates his pain at 10 on the 0-10 pain scale at this time.  Patient states that the Dilaudid that he currently has received  is not helping the pain at this time. MD notified and states they will come and see the patient.  Will continue to follow.   Donah Driver, RN

## 2021-05-01 ENCOUNTER — Encounter (HOSPITAL_COMMUNITY): Payer: Self-pay | Admitting: Family Medicine

## 2021-05-01 LAB — GLUCOSE, CAPILLARY
Glucose-Capillary: 148 mg/dL — ABNORMAL HIGH (ref 70–99)
Glucose-Capillary: 155 mg/dL — ABNORMAL HIGH (ref 70–99)
Glucose-Capillary: 156 mg/dL — ABNORMAL HIGH (ref 70–99)
Glucose-Capillary: 169 mg/dL — ABNORMAL HIGH (ref 70–99)
Glucose-Capillary: 206 mg/dL — ABNORMAL HIGH (ref 70–99)
Glucose-Capillary: 244 mg/dL — ABNORMAL HIGH (ref 70–99)

## 2021-05-01 LAB — BASIC METABOLIC PANEL
Anion gap: 13 (ref 5–15)
BUN: 50 mg/dL — ABNORMAL HIGH (ref 8–23)
CO2: 26 mmol/L (ref 22–32)
Calcium: 8.9 mg/dL (ref 8.9–10.3)
Chloride: 94 mmol/L — ABNORMAL LOW (ref 98–111)
Creatinine, Ser: 9.41 mg/dL — ABNORMAL HIGH (ref 0.61–1.24)
GFR, Estimated: 5 mL/min — ABNORMAL LOW (ref >60)
Glucose, Bld: 174 mg/dL — ABNORMAL HIGH (ref 70–99)
Potassium: 4 mmol/L (ref 3.5–5.1)
Sodium: 133 mmol/L — ABNORMAL LOW (ref 135–145)

## 2021-05-01 LAB — CBC
HCT: 31.4 % — ABNORMAL LOW (ref 39.0–52.0)
Hemoglobin: 10.7 g/dL — ABNORMAL LOW (ref 13.0–17.0)
MCH: 35.2 pg — ABNORMAL HIGH (ref 26.0–34.0)
MCHC: 34.1 g/dL (ref 30.0–36.0)
MCV: 103.3 fL — ABNORMAL HIGH (ref 80.0–100.0)
Platelets: 254 10*3/uL (ref 150–400)
RBC: 3.04 MIL/uL — ABNORMAL LOW (ref 4.22–5.81)
RDW: 13.8 % (ref 11.5–15.5)
WBC: 7.1 10*3/uL (ref 4.0–10.5)
nRBC: 0 % (ref 0.0–0.2)

## 2021-05-01 MED ORDER — SENNA 8.6 MG PO TABS
1.0000 | ORAL_TABLET | Freq: Every day | ORAL | Status: DC
  Administered 2021-05-01 – 2021-05-05 (×3): 8.6 mg via ORAL
  Filled 2021-05-01 (×4): qty 1

## 2021-05-01 MED ORDER — HEPARIN SODIUM (PORCINE) 1000 UNIT/ML IJ SOLN
INTRAMUSCULAR | Status: AC
  Filled 2021-05-01: qty 4

## 2021-05-01 MED ORDER — DOXERCALCIFEROL 4 MCG/2ML IV SOLN
INTRAVENOUS | Status: AC
  Administered 2021-05-01: 2 ug via INTRAVENOUS
  Filled 2021-05-01: qty 2

## 2021-05-01 NOTE — Progress Notes (Signed)
   I was notified of bleeding from right groin catheterization site after patient returned from dialysis.  On my examination, the cath site is now clean and dry without bleeding.  Site is also soft without hematoma.  There is a 2+ femoral pulse.  The nursing staff will continue to monitor for any further bleeding.  Thank you.  Dagoberto Ligas, PA-C [facility] [phone] 05/01/2021  2:05 PM

## 2021-05-01 NOTE — Progress Notes (Signed)
Mobility Specialist: Progress Note   05/01/21 1728  Mobility  Activity Ambulated to bathroom  Level of Assistance Contact guard assist, steadying assist  Assistive Device Cane  Distance Ambulated (ft) 15 ft  Mobility Ambulated with assistance in room  Mobility Response Tolerated well  Mobility performed by Mobility specialist  $Mobility charge 1 Mobility   Pt's bed alarm going off upon entering room. Pt requesting to use BR. Pt requesting to have some time in the BR to have a BM. Instructed pt to pull call string when he is done.   [facility] Jessamy Torosyan Mobility Specialist Mobility Specialist Phone: [phone]

## 2021-05-01 NOTE — Progress Notes (Signed)
Patient returned from dialysis with frank blood on boxers, femoral site oozing. Site was clean, dry and intact prior. Patient states it is "from straining to have bowel movement before he left for dialysis. PA made aware.

## 2021-05-01 NOTE — Procedures (Signed)
I was present at this dialysis session. I have reviewed the session itself and made appropriate changes.   Vital signs in last 24 hours:  Temp:  [97.9 F (36.6 C)-98.2 F (36.8 C)] 97.9 F (36.6 C) (07/18 0504) Pulse Rate:  [92] 92 (07/18 0504) Resp:  [16-19] 19 (07/18 0504) BP: (138-153)/(77-87) 153/87 (07/18 0504) SpO2:  [95 %-100 %] 95 % (07/18 0504) Weight change:  Filed Weights   04/27/21 1216 04/28/21 0807 04/28/21 1139  Weight: 61.7 kg 61.1 kg 60.1 kg    Recent Labs  Lab 05/01/21 0205  NA 133*  K 4.0  CL 94*  CO2 26  GLUCOSE 174*  BUN 50*  CREATININE 9.41*  CALCIUM 8.9    Recent Labs  Lab 04/27/21 1229 04/28/21 0202 04/29/21 0201 04/30/21 0109 05/01/21 0205  WBC 8.9   < > 8.1 6.5 7.1  NEUTROABS 6.7  --   --   --   --   HGB 11.3*   < > 11.1* 10.4* 10.7*  HCT 34.4*   < > 33.3* 31.2* 31.4*  MCV 106.8*   < > 103.7* 104.7* 103.3*  PLT 295   < > 257 260 254   < > = values in this interval not displayed.    Scheduled Meds:  acetaminophen  500 mg Oral Q6H   aspirin EC  81 mg Oral Daily   atorvastatin  40 mg Oral Daily   calcium acetate  667 mg Oral TID WC   cephALEXin  500 mg Oral QHS   Chlorhexidine Gluconate Cloth  6 each Topical Q0600   clopidogrel  75 mg Oral Daily   doxercalciferol       doxercalciferol  2 mcg Intravenous Q M,W,F-HD   feeding supplement (NEPRO CARB STEADY)  237 mL Oral BID BM   heparin  5,000 Units Subcutaneous Q8H   heparin sodium (porcine)       hydrALAZINE  50 mg Oral Q8H   insulin aspart  0-6 Units Subcutaneous TID WC   insulin glargine  10 Units Subcutaneous Daily   polyethylene glycol  17 g Oral Daily   senna  1 tablet Oral Daily   sodium chloride flush  3 mL Intravenous Q12H   Continuous Infusions:  sodium chloride     PRN Meds:.sodium chloride, HYDROmorphone, ondansetron (ZOFRAN) IV, polyvinyl alcohol, sodium chloride flush   Donetta Potts,  MD 05/01/2021, 8:23 AM

## 2021-05-01 NOTE — Progress Notes (Addendum)
  Progress Note    05/01/2021 8:16 AM 3 Days Post-Op  Subjective:  still has shooting pains into left toes   Vitals:   04/30/21 2009 05/01/21 0504  BP: 138/77 (!) 153/87  Pulse: 92 92  Resp: 16 19  Temp: 98.2 F (36.8 C) 97.9 F (36.6 C)  SpO2: 100% 95%   Physical Exam: Cardiac:  regular Lungs:  non labored Extremities:  LLE well perfused and warm with palpable PT. Motor and sensation intact. Dry gangrene of left 2nd toe and ulceration of 1st toe. Right common femoral access site clean, dry and intact without swelling or hematoma Neurologic: alert and oriented  CBC    Component Value Date/Time   WBC 7.1 05/01/2021 0205   RBC 3.04 (L) 05/01/2021 0205   HGB 10.7 (L) 05/01/2021 0205   HCT 31.4 (L) 05/01/2021 0205   PLT 254 05/01/2021 0205   MCV 103.3 (H) 05/01/2021 0205   MCH 35.2 (H) 05/01/2021 0205   MCHC 34.1 05/01/2021 0205   RDW 13.8 05/01/2021 0205   LYMPHSABS 1.4 04/27/2021 1229   MONOABS 0.7 04/27/2021 1229   EOSABS 0.1 04/27/2021 1229   BASOSABS 0.0 04/27/2021 1229    BMET    Component Value Date/Time   NA 133 (L) 05/01/2021 0205   K 4.0 05/01/2021 0205   CL 94 (L) 05/01/2021 0205   CO2 26 05/01/2021 0205   GLUCOSE 174 (H) 05/01/2021 0205   BUN 50 (H) 05/01/2021 0205   CREATININE 9.41 (H) 05/01/2021 0205   CALCIUM 8.9 05/01/2021 0205   CALCIUM 7.1 (L) 08/01/2018 0944   GFRNONAA 5 (L) 05/01/2021 0205   GFRAA 5 (L) 08/11/2018 0858    INR    Component Value Date/Time   INR 1.0 04/27/2021 1229     Intake/Output Summary (Last 24 hours) at 05/01/2021 0816 Last data filed at 04/30/2021 1700 Gross per 24 hour  Intake 594 ml  Output --  Net 594 ml     Assessment/Plan:  70 y.o. male is s/p  1) US guided right common femoral artery access 2) Aortogram 3) left angiogram with third order cannulation (27mL total contrast) 4) left popliteal angioplasty (5x24mm [place]) 5) left posterior tibial angioplasty (3x185mm [place]) 3 Days Post-Op   LLE  is well perfused with palpable PT signal in left foot. Dry gangrene of left 2nd toe and ulcer unchanged 1st toe Still having pain in left foot Continue Aspirin, statin plavix Will discuss further toe amputation plans with Dr. Stanford Breed   DVT prophylaxis:  sq heparin   Karoline Caldwell, PA-C [facility] [phone] 05/01/2021 8:16 AM  VASCULAR STAFF ADDENDUM: I have independently interviewed and examined the patient. I agree with the above.  Needs left second toe amputation for dry gangrene.  Patient wants this done prior to discharge. Will look for OR time later this week.  [name]. Stanford Breed, MD [facility] [place] Phone Number: [phone] 05/01/2021 2:13 PM

## 2021-05-01 NOTE — Progress Notes (Signed)
Family Medicine Teaching Service Daily Progress Note Intern Pager: [phone]  Patient name: Roger Vazquez Medical record number: [id number] Date of birth: 1951/03/16 Age: 70 y.o. Gender: male  Primary Care Provider: [facility] Consultants: VVS, Nephro Code Status: Full  Pt Overview and Major Events to Date:  7/14-admitted 7/15-arteriogram of LLE with multi-vessel angioplasty  Assessment and Plan: Roger Vazquez is a 70yo male with a history of T2DM, HTN, ESRD on HD MWF, lung CA, and hep C who presented with gangrene of the L second toe and overlying cellulitis. Now postop day 3 of LLE angiogram with multivessel angioplasty.   L Second Toe Gangrene Cellulitis PAD Post-op day 3 of L popliteal and posterior tibial angioplasty with improvement in distal pulses. Pain is well-controlled. Blood cx with NGTD. DVT US yesterday with preliminary findings of no DVT, pending final read. Per VVS's note, they are considering need for further amputation.  - Continue Keflex (day 5 of abx)  - VVS following, will defer to them on whether he requires further surgical intervention - Continue current pain regimen: Tylenol 500mg  q6, Dilaudid 1mg  PO q3 PRN - Continue Atorvastatin 40mg  daily - Continue Aspirin and Plavix  ESRD on HD MWF -Nephro with plan for HD today -Continue home PhosLo  DM2 CBGs 148-206 over past 24 hours. Received 4u Novolog over past day. - Continue current regimen of Lantus 10u  HTN BPs well-controlled on current regimen of hydralazine 50mg  q8.  - Continue with current regimen  FEN/GI: Renal Carb-modified with a 1200 mL fluid restriction PPx: Subcu heparin Dispo:Home pending clinical improvement . Barriers include possible need for further surgical intervention.   Subjective:  Roger Vazquez reports that he is still having intermittent shooting pain in his LLE. He has not noticed any change in his toe since the surgery. Denies any calf pain. No nausea/vomiting. Has  not had a BM in two days, requesting something to help with constipation.   Objective: Temp:  [97.9 F (36.6 C)-98.4 F (36.9 C)] 98.4 F (36.9 C) (07/18 0825) Pulse Rate:  [84-92] 84 (07/18 0825) Resp:  [16-19] 19 (07/18 0504) BP: (138-153)/(77-87) 151/86 (07/18 0825) SpO2:  [95 %-100 %] 95 % (07/18 0504) Physical Exam: General: Well-appearing, resting comfortably in bed Cardiovascular: Regular rate, regular rhythm, transmitted fistula sounds Respiratory: CTAB, normal WOB on RA Abdomen: Soft, non-tender, non-distended Extremities: LLE with faintly palpable PT pulse, necrotic second toe stable from previous exam  Laboratory: Recent Labs  Lab 04/29/21 0201 04/30/21 0109 05/01/21 0205  WBC 8.1 6.5 7.1  HGB 11.1* 10.4* 10.7*  HCT 33.3* 31.2* 31.4*  PLT 257 260 254   Recent Labs  Lab 04/27/21 1229 04/28/21 0202 04/30/21 0827 05/01/21 0205  NA 137 136 133* 133*  K 3.7 3.8 4.7 4.0  CL 95* 99 95* 94*  CO2 29 28 24 26   BUN 23 28* 36* 50*  CREATININE 7.45* 7.97* 7.79* 9.41*  CALCIUM 8.9 8.6* 9.0 8.9  PROT 8.5* 7.3  --   --   BILITOT 0.4 0.3  --   --   ALKPHOS 88 74  --   --   ALT 9 9  --   --   AST 10* 7*  --   --   GLUCOSE 206* 168* 196* 174*      Imaging/Diagnostic Tests: DVT US preliminary read with no evidence of DVT in [place], MD 05/01/2021, 9:00 AM PGY-1, [place] Intern pager: [phone], text pages welcome

## 2021-05-01 NOTE — Progress Notes (Signed)
FPTS Brief Progress Note  S:Patient is sleeping comfortably in bed and doesn't appear to be in any distress.    O: BP (!) 113/54 (BP Location: Left Arm)   Pulse 90   Temp 98.6 F (37 C) (Oral)   Resp 18   Ht 5\' 6"  (1.676 m)   Wt 60.1 kg   SpO2 98%   BMI 21.39 kg/m   General: Well appearing, sleeping in bed  Respiratory: Non-labored breathing    A/P: Left second toe gangrene Patient is post up day 3 of LLE angioplasty. VVS is following and planning amputation of the left second toe sometime this week.  -continue Keflex -continue Asprin and plavix -continue pain control with tylemol 650mg  or Dilaudid 1mg  prn    Alen Bleacher, MD 05/01/2021, 9:47 PM PGY-1, [facility] Night Resident  Please page [phone] with questions.

## 2021-05-02 LAB — CULTURE, BLOOD (ROUTINE X 2)
Culture: NO GROWTH
Culture: NO GROWTH
Special Requests: ADEQUATE

## 2021-05-02 LAB — GLUCOSE, CAPILLARY
Glucose-Capillary: 170 mg/dL — ABNORMAL HIGH (ref 70–99)
Glucose-Capillary: 175 mg/dL — ABNORMAL HIGH (ref 70–99)
Glucose-Capillary: 176 mg/dL — ABNORMAL HIGH (ref 70–99)
Glucose-Capillary: 186 mg/dL — ABNORMAL HIGH (ref 70–99)
Glucose-Capillary: 234 mg/dL — ABNORMAL HIGH (ref 70–99)

## 2021-05-02 MED ORDER — POLYETHYLENE GLYCOL 3350 17 G PO PACK
17.0000 g | PACK | Freq: Two times a day (BID) | ORAL | Status: DC
  Administered 2021-05-02 – 2021-05-03 (×2): 17 g via ORAL
  Filled 2021-05-02 (×4): qty 1

## 2021-05-02 NOTE — Progress Notes (Addendum)
[facility] Progress Note   Subjective:  Seen in room - eating breakfast. Tells me plan is for L 2nd toe amputation on Thursday 7/21. No CP or dyspnea. HD went fine yesterday - net UF 1.5L.  Objective Vitals:   05/01/21 1426 05/01/21 1524 05/01/21 2020 05/02/21 0545  BP: (!) 115/52 (!) 119/58 (!) 113/54 120/60  Pulse: 97  90 89  Resp: 16  18 16   Temp: 98.5 F (36.9 C)  98.6 F (37 C) 99 F (37.2 C)  TempSrc: Oral  Oral Oral  SpO2: 99%  98% 97%  Weight:      Height:       Physical Exam General: Frail man, NAD. Room air. Heart: RRR; no murmur Lungs: CTA anteriorly Abdomen: distended, non-tender Extremities: No LE edema; L foot wound not examined today Dialysis Access: R AVG + bruit  Additional Objective Labs: Basic Metabolic Panel: Recent Labs  Lab 04/28/21 0202 04/30/21 0827 05/01/21 0205  NA 136 133* 133*  K 3.8 4.7 4.0  CL 99 95* 94*  CO2 28 24 26   GLUCOSE 168* 196* 174*  BUN 28* 36* 50*  CREATININE 7.97* 7.79* 9.41*  CALCIUM 8.6* 9.0 8.9   Liver Function Tests: Recent Labs  Lab 04/27/21 1229 04/28/21 0202  AST 10* 7*  ALT 9 9  ALKPHOS 88 74  BILITOT 0.4 0.3  PROT 8.5* 7.3  ALBUMIN 3.6 3.0*   CBC: Recent Labs  Lab 04/27/21 1229 04/28/21 0202 04/29/21 0201 04/30/21 0109 05/01/21 0205  WBC 8.9 7.5 8.1 6.5 7.1  NEUTROABS 6.7  --   --   --   --   HGB 11.3* 10.3* 11.1* 10.4* 10.7*  HCT 34.4* 32.1* 33.3* 31.2* 31.4*  MCV 106.8* 107.4* 103.7* 104.7* 103.3*  PLT 295 254 257 260 254   CBG: Recent Labs  Lab 05/01/21 1209 05/01/21 1539 05/01/21 2019 05/02/21 0024 05/02/21 0759  GLUCAP 155* 244* 206* 176* 175*   Studies/Results: VAS [place] LOWER EXTREMITY VENOUS (DVT)  Result Date: 05/01/2021  Lower Venous DVT Study Patient Name:  ROBSON TRICKEY  Date of Exam:   04/30/2021 Medical Rec #: [id number]         Accession #:    [id number] Date of Birth: 1951/02/25         Patient Gender: M Patient Age:   70Y Exam Location:  [facility] Procedure:      VAS [place] LOWER EXTREMITY VENOUS (DVT) Referring Phys: [id number] MARGARET E PRAY --------------------------------------------------------------------------------  Indications: Pain. Other Indications: Post left popliteal and posterior tibial angioplasty for CLI                    with tissue loss. Comparison Study: No prior study on file Performing Technologist: Sharion Dove RVS  Examination Guidelines: A complete evaluation includes B-mode imaging, spectral Doppler, color Doppler, and power Doppler as needed of all accessible portions of each vessel. Bilateral testing is considered an integral part of a complete examination. Limited examinations for reoccurring indications may be performed as noted. The reflux portion of the exam is performed with the patient in reverse Trendelenburg.  +-----+---------------+---------+-----------+----------+--------------+ RIGHTCompressibilityPhasicitySpontaneityPropertiesThrombus Aging +-----+---------------+---------+-----------+----------+--------------+ CFV  Full           Yes      Yes                                 +-----+---------------+---------+-----------+----------+--------------+   +---------+---------------+---------+-----------+----------+--------------+ LEFT  CompressibilityPhasicitySpontaneityPropertiesThrombus Aging +---------+---------------+---------+-----------+----------+--------------+ CFV      Full           Yes      Yes                                 +---------+---------------+---------+-----------+----------+--------------+ SFJ      Full                                                        +---------+---------------+---------+-----------+----------+--------------+ FV Prox  Full                                                        +---------+---------------+---------+-----------+----------+--------------+ FV Mid   Full                                                         +---------+---------------+---------+-----------+----------+--------------+ FV DistalFull                                                        +---------+---------------+---------+-----------+----------+--------------+ PFV      Full                                                        +---------+---------------+---------+-----------+----------+--------------+ POP      Full           Yes      Yes                                 +---------+---------------+---------+-----------+----------+--------------+ PTV      Full                                                        +---------+---------------+---------+-----------+----------+--------------+ PERO     Full                                                        +---------+---------------+---------+-----------+----------+--------------+     Summary: RIGHT: - No evidence of common femoral vein obstruction.  LEFT: - There is no evidence of deep vein thrombosis in the lower extremity.  *See table(s) above for measurements and observations. Electronically signed by Ruta Hinds MD on 05/01/2021 at 11:47:45 AM.    Final  Medications:  sodium chloride      acetaminophen  500 mg Oral Q6H   aspirin EC  81 mg Oral Daily   atorvastatin  40 mg Oral Daily   calcium acetate  667 mg Oral TID WC   cephALEXin  500 mg Oral QHS   clopidogrel  75 mg Oral Daily   doxercalciferol  2 mcg Intravenous Q M,W,F-HD   feeding supplement (NEPRO CARB STEADY)  237 mL Oral BID BM   heparin  5,000 Units Subcutaneous Q8H   hydrALAZINE  50 mg Oral Q8H   insulin aspart  0-6 Units Subcutaneous TID WC   insulin glargine  10 Units Subcutaneous Daily   polyethylene glycol  17 g Oral Daily   senna  1 tablet Oral Daily   sodium chloride flush  3 mL Intravenous Q12H    Dialysis Orders: MWF at [facility] 3hr, EDW 60.5kg, 2K/3Ca, AVG, heparin 4000 - Hectoral 60mcg IV q HD - Mircera 64mcg IV q 2 weeks - last 7/13  Assessment/Plan: 1. L 2nd toe  gangrene: VVS consulted, s/p PTA to L popliteal and L PT arteries on 7/15 by Dr. Stanford Breed. Pain improved. For L 2nd toe amputation on 7/21, per patient. Finishing course of PO Keflex. 2. ESRD: Continue HD per usual MWF schedule -> next HD 7/20. 3. HTN/volume: BP variable, at EDW. Challenge as tolerated. 4. Anemia: Hgb 10.7, stable without ESA for now. 5. Secondary hyperparathyroidism: Ca ok, Phos pending. Continue home binders + VDRA. 6. Nutrition:  Alb low, continue supplements. 7. T2DM 8. Hep C +  Veneta Penton, PA-C 05/02/2021, 10:35 AM  [facility]  I have seen and examined this patient and agree with plan and assessment in the above note with renal recommendations/intervention highlighted. For toe amputation later this week.  Continue with HD on MWF schedule while he remains an inpatient.  Governor Rooks Jaryd Drew,MD 05/02/2021 2:51 PM

## 2021-05-02 NOTE — Progress Notes (Signed)
Family Medicine Teaching Service Daily Progress Note Intern Pager: [phone]  Patient name: Roger Vazquez Medical record number: [id number] Date of birth: 1951/10/15 Age: 70 y.o. Gender: male  Primary Care Provider: [facility] Consultants: VVS, Nephro Code Status: Full  Pt Overview and Major Events to Date:  7/14- admitted 7/15- arteriogram of LLE with multi-vessel angioplasty 7/21 (scheduled)- L second toe amputation  Assessment and Plan: Roger Vazquez is a 70yo male with a history of T2DM, HTN, ESRD on HD MWF, hx of lung CA, who presented with gangrene of the L second toe with overlying cellulitis. Now postop day 4 of LLE angiogram with multivessel angioplasty and plans to return to OR on 7/21 for removal of gangrenous toe.  L Second Toe Gangrene Cellulitis PAD Post-op day 4 of L popliteal and posterior tibial angioplasty. Day 6 of abx. Scheduled for OR on 7/21 for amputation of toe. Only required one dose of dilaudid yesterday for pain.  - Continue Keflex (day 6) - VVS following, appreciate their recs - Continue current pain regimen with scheduled tylenol and PRN dilaudid - Continue Aspirin and Plavix  ESRD on HD MWF Dialyzed yesterday. - Continue PhosLo  DM2 CBGs 176-244 over past 24 hours.  - Continue current regimen of Lantus 10u  - vsSSI  HTN Well-controlled on current regimen of hydralazine 50mg  q8. - Continue current regimen  Constipation No BM in two days. Endorses feeling of abdominal fullness.  - MiraLAX and senna  FEN/GI: Renal, carb-modified with 1227mL fluid restriction PPx: Subcu heparin Dispo:Home in 2-3 days. Barriers include surgery on 7/21.   Subjective:  Roger Vazquez voices some disappointment that he will be losing his toe. He states that he was really hoping to keep the toe but understands the need for amputation. Feels his pain is well-controlled on current regimen.  No BM in two days, reports some abdominal fullness with meals that  he thinks is related to not having a BM.   Objective: Temp:  [98.2 F (36.8 C)-99 F (37.2 C)] 99 F (37.2 C) (07/19 0545) Pulse Rate:  [59-97] 89 (07/19 0545) Resp:  [10-18] 16 (07/19 0545) BP: (93-185)/(52-86) 120/60 (07/19 0545) SpO2:  [97 %-100 %] 97 % (07/19 0545) Weight:  [60.1 kg] 60.1 kg (07/18 0825) Physical Exam: General: Well-appearing older man resting comfortably in bed Cardiovascular: Regular rate, rhythm, transmitted fistula sounds Respiratory: CTAB, normal work of breathing on room air Abdomen: Soft, nontender, mildly distended Extremities: LLE with necrotic second toe stable from previous exam, barely perceptible distal pulses  Laboratory: Recent Labs  Lab 04/29/21 0201 04/30/21 0109 05/01/21 0205  WBC 8.1 6.5 7.1  HGB 11.1* 10.4* 10.7*  HCT 33.3* 31.2* 31.4*  PLT 257 260 254   Recent Labs  Lab 04/27/21 1229 04/28/21 0202 04/30/21 0827 05/01/21 0205  NA 137 136 133* 133*  K 3.7 3.8 4.7 4.0  CL 95* 99 95* 94*  CO2 29 28 24 26   BUN 23 28* 36* 50*  CREATININE 7.45* 7.97* 7.79* 9.41*  CALCIUM 8.9 8.6* 9.0 8.9  PROT 8.5* 7.3  --   --   BILITOT 0.4 0.3  --   --   ALKPHOS 88 74  --   --   ALT 9 9  --   --   AST 10* 7*  --   --   GLUCOSE 206* 168* 196* 174*    Imaging/Diagnostic Tests: No new imaging/tests  Eppie Gibson, MD 05/02/2021, 6:54 AM PGY-1, [place]  Intern pager: [phone], text pages welcome

## 2021-05-03 LAB — GLUCOSE, CAPILLARY
Glucose-Capillary: 140 mg/dL — ABNORMAL HIGH (ref 70–99)
Glucose-Capillary: 264 mg/dL — ABNORMAL HIGH (ref 70–99)
Glucose-Capillary: 206 mg/dL — ABNORMAL HIGH (ref 70–99)

## 2021-05-03 NOTE — Progress Notes (Addendum)
Roger Vazquez Progress Note   Subjective:   Seen in room - doing well, eating breakfast. No CP or dyspnea. Plan is still for L 2nd toe amputation tomorrow.  Objective Vitals:   05/02/21 2322 05/03/21 0504 05/03/21 0505 05/03/21 0735  BP: 140/62 139/62 139/62 (!) 153/78  Pulse: 70  91 85  Resp: 18  17 18   Temp: 98.4 F (36.9 C)  98.1 F (36.7 C) 98.2 F (36.8 C)  TempSrc: Oral  Oral Oral  SpO2: 100%  100% 100%  Weight:      Height:       Physical Exam General: Well appearing man - NAD. Room air. Heart: RRR; no murmur Lungs: CTA anteriorly Abdomen: distended, non-tender Extremities: No LE edema; L foot wound not examined today Dialysis Access: R AVG + bruit   Additional Objective Labs: Basic Metabolic Panel: Recent Labs  Lab 04/28/21 0202 04/30/21 0827 05/01/21 0205  NA 136 133* 133*  K 3.8 4.7 4.0  CL 99 95* 94*  CO2 28 24 26   GLUCOSE 168* 196* 174*  BUN 28* 36* 50*  CREATININE 7.97* 7.79* 9.41*  CALCIUM 8.6* 9.0 8.9   Liver Function Tests: Recent Labs  Lab 04/27/21 1229 04/28/21 0202  AST 10* 7*  ALT 9 9  ALKPHOS 88 74  BILITOT 0.4 0.3  PROT 8.5* 7.3  ALBUMIN 3.6 3.0*   CBC: Recent Labs  Lab 04/27/21 1229 04/28/21 0202 04/29/21 0201 04/30/21 0109 05/01/21 0205  WBC 8.9 7.5 8.1 6.5 7.1  NEUTROABS 6.7  --   --   --   --   HGB 11.3* 10.3* 11.1* 10.4* 10.7*  HCT 34.4* 32.1* 33.3* 31.2* 31.4*  MCV 106.8* 107.4* 103.7* 104.7* 103.3*  PLT 295 254 257 260 254   Blood Culture    Component Value Date/Time   SDES BLOOD BLOOD LEFT FOREARM 04/27/2021 1656   SPECREQUEST  04/27/2021 1656    BOTTLES DRAWN AEROBIC AND ANAEROBIC Blood Culture results may not be optimal due to an inadequate volume of blood received in culture bottles   CULT  04/27/2021 1656    NO GROWTH 5 DAYS Performed at [facility], [place] [address], [place],  [address]    REPTSTATUS 05/02/2021 FINAL 04/27/2021 1656   CBG: Recent Labs  Lab 05/02/21 0759  05/02/21 1145 05/02/21 1607 05/02/21 2118 05/03/21 0737  GLUCAP 175* 234* 170* 186* 140*   Medications:  sodium chloride      acetaminophen  500 mg Oral Q6H   aspirin EC  81 mg Oral Daily   atorvastatin  40 mg Oral Daily   calcium acetate  667 mg Oral TID WC   clopidogrel  75 mg Oral Daily   doxercalciferol  2 mcg Intravenous Q M,W,F-HD   feeding supplement (NEPRO CARB STEADY)  237 mL Oral BID BM   heparin  5,000 Units Subcutaneous Q8H   hydrALAZINE  50 mg Oral Q8H   insulin aspart  0-6 Units Subcutaneous TID WC   insulin glargine  10 Units Subcutaneous Daily   polyethylene glycol  17 g Oral BID   senna  1 tablet Oral Daily   sodium chloride flush  3 mL Intravenous Q12H    Dialysis Orders: MWF at [facility] 3hr, EDW 60.5kg, 2K/3Ca, AVG, heparin 4000 - Hectoral 61mcg IV q HD - Mircera 17mcg IV q 2 weeks - last 7/13   Assessment/Plan: 1. L 2nd toe gangrene: VVS consulted, s/p PTA to L popliteal and L PT arteries on 7/15  by Dr. Stanford Breed. Pain improved. For L 2nd toe amputation on 7/21, per patient. Finishing course of PO Keflex. 2. ESRD: Continue HD per usual MWF schedule -> HD later today. 3. HTN/volume: BP variable, at EDW. Challenge as tolerated. 4. Anemia: Hgb 10.7, stable without ESA for now. 5. Secondary hyperparathyroidism: Ca ok, Phos pending. Continue home binders + VDRA. 6. Nutrition:  Alb low, continue supplements. 7. T2DM 8. Hep C +  Roger Penton, PA-C 05/03/2021, 10:58 AM  [name]  I have seen and examined this patient and agree with plan and assessment in the above note with renal recommendations/intervention highlighted.  Roger Vazquez Roger Bentlie Catanzaro,MD 05/03/2021 2:00 PM

## 2021-05-03 NOTE — Progress Notes (Signed)
Family Medicine Teaching Service Daily Progress Note Intern Pager: [phone]  Patient name: Roger Vazquez Medical record number: [id number] Date of birth: 02/02/1951 Age: 70 y.o. Gender: male  Primary Care Provider: [facility] Consultants: VVS, Nephro Code Status: Full  Pt Overview and Major Events to Date:  7/14- admitted 7/15- arteriogram of LLE with multi-vessel angioplasty 7/21- (scheduled) L second toe amputation  Assessment and Plan: [name] is a 70yo male with a history of T2DM, HTN, ESRD on HD MWF, history of lung cancer, who presented with gangrene of the left second toe with overlying cellulitis.  Now is postop day 5 of LLE angiogram with multivessel angioplasty and plans to return to the OR on 7/21 for removal of gangrenous toe.  L Second Toe Gangrene Cellulitis PAD Postop day 5 left popliteal and posterior tibial angioplasty.  Day 7 of antibiotics.  Scheduled for OR on 7/21 for amputation of toe.  Has not required narcotics for greater than 24 hours for pain. -Continue Keflex (day 7) -OR for second toe amputation tomorrow  -VVS following, appreciate their recs -Continue current pain regimen with scheduled Tylenol and as needed Dilaudid -Continue ASA and Plavix -N.p.o. at midnight  ESRD on HD MWF -Dialysis today -Continue PhosLo  Constipation, resolved Had small amount of stool yesterday. - Continue MiraLAX, Senna  Diabetes CBGs 170-186 -Continue Lantus 10 units  HTN Well-controlled. -Continue hydralazine 50 mg every 8 hours   FEN/GI: Renal, carb modified with 1200 mL fluid restriction PPx: Subcu heparin Dispo:Home in 2-3 days. Barriers include surgery tomorrow.   Subjective:  [name] reports feeling overall well this morning.  He says that his pain is at a 5 out of 10 and is tolerable on his current regimen.  He has not used any Dilaudid in the last 24 hours.  He states that he did have a bowel movement yesterday but that it was  smaller than usual.  No other complaints at this time.  Voices understanding about plan for amputation surgery tomorrow.  Objective: Temp:  [98 F (36.7 C)-98.4 F (36.9 C)] 98.1 F (36.7 C) (07/20 0505) Pulse Rate:  [70-91] 91 (07/20 0505) Resp:  [16-18] 17 (07/20 0505) BP: (120-165)/(62-87) 139/62 (07/20 0505) SpO2:  [100 %] 100 % (07/20 0505) Physical Exam: General: Well-appearing older gentleman resting in bed, arouses easily to voice Cardiovascular: Regular rate, regular rhythm, transmitted fistula sounds Respiratory: Clear to auscultation bilaterally, normal work of breathing on room air Abdomen: Soft, nontender, nondistended Extremities: Left lower extremity with necrotic second toe stable from previous exam, healing ulcer of first toe without drainage, barely perceptible distal pulses  Laboratory: Recent Labs  Lab 04/29/21 0201 04/30/21 0109 05/01/21 0205  WBC 8.1 6.5 7.1  HGB 11.1* 10.4* 10.7*  HCT 33.3* 31.2* 31.4*  PLT 257 260 254   Recent Labs  Lab 04/27/21 1229 04/28/21 0202 04/30/21 0827 05/01/21 0205  NA 137 136 133* 133*  K 3.7 3.8 4.7 4.0  CL 95* 99 95* 94*  CO2 29 28 24 26   BUN 23 28* 36* 50*  CREATININE 7.45* 7.97* 7.79* 9.41*  CALCIUM 8.9 8.6* 9.0 8.9  PROT 8.5* 7.3  --   --   BILITOT 0.4 0.3  --   --   ALKPHOS 88 74  --   --   ALT 9 9  --   --   AST 10* 7*  --   --   GLUCOSE 206* 168* 196* 174*     Imaging/Diagnostic Tests: No  new imaging/tests  Eppie Gibson, MD 05/03/2021, 6:43 AM PGY-1, [place] Intern pager: [phone], text pages welcome

## 2021-05-03 NOTE — Care Management Important Message (Signed)
Important Message  Patient Details  Name: SCORPIO FORTIN MRN: [id number] Date of Birth: 12-06-1950   Medicare Important Message Given:  Yes     Shelda Altes 05/03/2021, 10:30 AM

## 2021-05-03 NOTE — Progress Notes (Signed)
  Progress Note    05/03/2021 9:13 AM 5 Days Post-Op  Subjective:  No complaints   Vitals:   05/03/21 0505 05/03/21 0735  BP: 139/62 (!) 153/78  Pulse: 91 85  Resp: 17 18  Temp: 98.1 F (36.7 C) 98.2 F (36.8 C)  SpO2: 100% 100%   Physical Exam: Lungs:  non labored Incisions:  R groin without hematoma Extremities: gangrenous L 2nd toe; L GT tip ulcer without purulence Neurologic: A&O  CBC    Component Value Date/Time   WBC 7.1 05/01/2021 0205   RBC 3.04 (L) 05/01/2021 0205   HGB 10.7 (L) 05/01/2021 0205   HCT 31.4 (L) 05/01/2021 0205   PLT 254 05/01/2021 0205   MCV 103.3 (H) 05/01/2021 0205   MCH 35.2 (H) 05/01/2021 0205   MCHC 34.1 05/01/2021 0205   RDW 13.8 05/01/2021 0205   LYMPHSABS 1.4 04/27/2021 1229   MONOABS 0.7 04/27/2021 1229   EOSABS 0.1 04/27/2021 1229   BASOSABS 0.0 04/27/2021 1229    BMET    Component Value Date/Time   NA 133 (L) 05/01/2021 0205   K 4.0 05/01/2021 0205   CL 94 (L) 05/01/2021 0205   CO2 26 05/01/2021 0205   GLUCOSE 174 (H) 05/01/2021 0205   BUN 50 (H) 05/01/2021 0205   CREATININE 9.41 (H) 05/01/2021 0205   CALCIUM 8.9 05/01/2021 0205   CALCIUM 7.1 (L) 08/01/2018 0944   GFRNONAA 5 (L) 05/01/2021 0205   GFRAA 5 (L) 08/11/2018 0858    INR    Component Value Date/Time   INR 1.0 04/27/2021 1229     Intake/Output Summary (Last 24 hours) at 05/03/2021 0913 Last data filed at 05/02/2021 2200 Gross per 24 hour  Intake 240 ml  Output --  Net 240 ml     Assessment/Plan:  70 y.o. male is s/p L popliteal and PTA angioplasty 5 Days Post-Op   Plan is for L 2nd toe amputation tomorrow in the OR Risks were discussed with the patient and all questions answered NPO after midnight Consent   Dagoberto Ligas, PA-C [facility] [phone] 05/03/2021 9:13 AM

## 2021-05-04 ENCOUNTER — Inpatient Hospital Stay (HOSPITAL_COMMUNITY): Payer: Medicare HMO | Admitting: Certified Registered Nurse Anesthetist

## 2021-05-04 ENCOUNTER — Encounter (HOSPITAL_COMMUNITY): Payer: Self-pay | Admitting: Family Medicine

## 2021-05-04 ENCOUNTER — Encounter (HOSPITAL_COMMUNITY)
Admission: EM | Disposition: A | Discharge status: Home / Self Care | Payer: Self-pay | Source: Home / Self Care | Attending: Family Medicine

## 2021-05-04 DIAGNOSIS — L97529 Non-pressure chronic ulcer of other part of left foot with unspecified severity: Secondary | ICD-10-CM

## 2021-05-04 DIAGNOSIS — I96 Gangrene, not elsewhere classified: Secondary | ICD-10-CM

## 2021-05-04 HISTORY — PX: AMPUTATION: SHX166

## 2021-05-04 LAB — GLUCOSE, CAPILLARY
Glucose-Capillary: 137 mg/dL — ABNORMAL HIGH (ref 70–99)
Glucose-Capillary: 129 mg/dL — ABNORMAL HIGH (ref 70–99)
Glucose-Capillary: 134 mg/dL — ABNORMAL HIGH (ref 70–99)
Glucose-Capillary: 119 mg/dL — ABNORMAL HIGH (ref 70–99)
Glucose-Capillary: 274 mg/dL — ABNORMAL HIGH (ref 70–99)
Glucose-Capillary: 265 mg/dL — ABNORMAL HIGH (ref 70–99)

## 2021-05-04 LAB — CBC
HCT: 31 % — ABNORMAL LOW (ref 39.0–52.0)
Hemoglobin: 10.7 g/dL — ABNORMAL LOW (ref 13.0–17.0)
MCH: 35.4 pg — ABNORMAL HIGH (ref 26.0–34.0)
MCHC: 34.5 g/dL (ref 30.0–36.0)
MCV: 102.6 fL — ABNORMAL HIGH (ref 80.0–100.0)
Platelets: 247 10*3/uL (ref 150–400)
RBC: 3.02 MIL/uL — ABNORMAL LOW (ref 4.22–5.81)
RDW: 14.3 % (ref 11.5–15.5)
WBC: 7.6 10*3/uL (ref 4.0–10.5)
nRBC: 0 % (ref 0.0–0.2)

## 2021-05-04 LAB — BASIC METABOLIC PANEL
Anion gap: 10 (ref 5–15)
BUN: 25 mg/dL — ABNORMAL HIGH (ref 8–23)
CO2: 28 mmol/L (ref 22–32)
Calcium: 9 mg/dL (ref 8.9–10.3)
Chloride: 94 mmol/L — ABNORMAL LOW (ref 98–111)
Creatinine, Ser: 5.11 mg/dL — ABNORMAL HIGH (ref 0.61–1.24)
GFR, Estimated: 11 mL/min — ABNORMAL LOW (ref >60)
Glucose, Bld: 133 mg/dL — ABNORMAL HIGH (ref 70–99)
Potassium: 4.9 mmol/L (ref 3.5–5.1)
Sodium: 132 mmol/L — ABNORMAL LOW (ref 135–145)

## 2021-05-04 SURGERY — AMPUTATION DIGIT
Anesthesia: General | Site: Foot | Laterality: Left

## 2021-05-04 MED ORDER — LIDOCAINE 2% (20 MG/ML) 5 ML SYRINGE
INTRAMUSCULAR | Status: DC | PRN
  Administered 2021-05-04: 60 mg via INTRAVENOUS

## 2021-05-04 MED ORDER — LIDOCAINE 2% (20 MG/ML) 5 ML SYRINGE
INTRAMUSCULAR | Status: AC
  Filled 2021-05-04: qty 5

## 2021-05-04 MED ORDER — BACITRACIN ZINC 500 UNIT/GM EX OINT
TOPICAL_OINTMENT | CUTANEOUS | Status: AC
  Filled 2021-05-04: qty 28.35

## 2021-05-04 MED ORDER — CHLORHEXIDINE GLUCONATE 0.12 % MT SOLN
OROMUCOSAL | Status: AC
  Administered 2021-05-04: 15 mL via OROMUCOSAL
  Filled 2021-05-04: qty 15

## 2021-05-04 MED ORDER — DEXAMETHASONE SODIUM PHOSPHATE 10 MG/ML IJ SOLN
INTRAMUSCULAR | Status: DC | PRN
Start: 2021-05-04 — End: 2021-05-04
  Administered 2021-05-04: 5 mg via INTRAVENOUS

## 2021-05-04 MED ORDER — ORAL CARE MOUTH RINSE: 15.0000 mL | Freq: Once | OROMUCOSAL | Status: AC

## 2021-05-04 MED ORDER — CHLORHEXIDINE GLUCONATE 0.12 % MT SOLN: 15.0000 mL | Freq: Once | OROMUCOSAL | Status: AC

## 2021-05-04 MED ORDER — 0.9 % SODIUM CHLORIDE (POUR BTL) OPTIME
TOPICAL | Status: DC | PRN
  Administered 2021-05-04: 1000 mL

## 2021-05-04 MED ORDER — ONDANSETRON HCL 4 MG/2ML IJ SOLN: 4.0000 mg | Freq: Four times a day (QID) | INTRAMUSCULAR | Status: DC | PRN

## 2021-05-04 MED ORDER — PHENYLEPHRINE 40 MCG/ML (10ML) SYRINGE FOR IV PUSH (FOR BLOOD PRESSURE SUPPORT)
PREFILLED_SYRINGE | INTRAVENOUS | Status: AC
  Filled 2021-05-04: qty 10

## 2021-05-04 MED ORDER — PROPOFOL 10 MG/ML IV BOLUS
INTRAVENOUS | Status: DC | PRN
  Administered 2021-05-04: 130 mg via INTRAVENOUS

## 2021-05-04 MED ORDER — OXYCODONE HCL 5 MG PO TABS: 5.0000 mg | ORAL_TABLET | Freq: Once | ORAL | Status: DC | PRN

## 2021-05-04 MED ORDER — FENTANYL CITRATE (PF) 100 MCG/2ML IJ SOLN
25.0000 ug | INTRAMUSCULAR | Status: DC | PRN
  Administered 2021-05-04: 25 ug via INTRAVENOUS
  Administered 2021-05-04: 50 ug via INTRAVENOUS
  Administered 2021-05-04: 25 ug via INTRAVENOUS

## 2021-05-04 MED ORDER — PHENYLEPHRINE HCL-NACL 10-0.9 MG/250ML-% IV SOLN
INTRAVENOUS | Status: DC | PRN
  Administered 2021-05-04: 50 ug/min via INTRAVENOUS

## 2021-05-04 MED ORDER — OXYCODONE HCL 5 MG/5ML PO SOLN: 5.0000 mg | Freq: Once | ORAL | Status: DC | PRN

## 2021-05-04 MED ORDER — FENTANYL CITRATE (PF) 250 MCG/5ML IJ SOLN
INTRAMUSCULAR | Status: AC
  Filled 2021-05-04: qty 5

## 2021-05-04 MED ORDER — PHENYLEPHRINE 40 MCG/ML (10ML) SYRINGE FOR IV PUSH (FOR BLOOD PRESSURE SUPPORT)
PREFILLED_SYRINGE | INTRAVENOUS | Status: DC | PRN
  Administered 2021-05-04 (×2): 120 ug via INTRAVENOUS

## 2021-05-04 MED ORDER — ONDANSETRON HCL 4 MG/2ML IJ SOLN
INTRAMUSCULAR | Status: DC | PRN
  Administered 2021-05-04: 4 mg via INTRAVENOUS

## 2021-05-04 MED ORDER — ONDANSETRON HCL 4 MG/2ML IJ SOLN
INTRAMUSCULAR | Status: AC
  Filled 2021-05-04: qty 2

## 2021-05-04 MED ORDER — CEFAZOLIN SODIUM-DEXTROSE 2-4 GM/100ML-% IV SOLN
2.0000 g | Freq: Once | INTRAVENOUS | Status: AC
  Administered 2021-05-04: 2 g via INTRAVENOUS

## 2021-05-04 MED ORDER — PROPOFOL 10 MG/ML IV BOLUS
INTRAVENOUS | Status: AC
  Filled 2021-05-04: qty 20

## 2021-05-04 MED ORDER — SODIUM CHLORIDE 0.9 % IV SOLN: INTRAVENOUS | Status: DC

## 2021-05-04 MED ORDER — CEFAZOLIN SODIUM-DEXTROSE 2-4 GM/100ML-% IV SOLN
INTRAVENOUS | Status: AC
  Filled 2021-05-04: qty 100

## 2021-05-04 MED ORDER — OXYCODONE-ACETAMINOPHEN 5-325 MG PO TABS
1.0000 | ORAL_TABLET | ORAL | Status: DC | PRN
  Administered 2021-05-04: 1 via ORAL
  Filled 2021-05-04: qty 1

## 2021-05-04 MED ORDER — FENTANYL CITRATE (PF) 100 MCG/2ML IJ SOLN
INTRAMUSCULAR | Status: AC
  Filled 2021-05-04: qty 2

## 2021-05-04 SURGICAL SUPPLY — 37 items
BAG COUNTER SPONGE SURGICOUNT (BAG) ×2 IMPLANT
BAG SPNG CNTER NS LX DISP (BAG) ×1
BLADE AVERAGE 25X9 (BLADE) IMPLANT
BLADE SURG 21 STRL SS (BLADE) ×2 IMPLANT
BNDG CONFORM 3 STRL LF (GAUZE/BANDAGES/DRESSINGS) ×2 IMPLANT
BNDG ELASTIC 4X5.8 VLCR STR LF (GAUZE/BANDAGES/DRESSINGS) ×2 IMPLANT
BNDG GAUZE ELAST 4 BULKY (GAUZE/BANDAGES/DRESSINGS) ×2 IMPLANT
CANISTER SUCT 3000ML PPV (MISCELLANEOUS) ×2 IMPLANT
COVER SURGICAL LIGHT HANDLE (MISCELLANEOUS) ×2 IMPLANT
DRAPE HALF SHEET 40X57 (DRAPES) ×2 IMPLANT
DRAPE ORTHO SPLIT 77X108 STRL (DRAPES) ×4
DRAPE SURG ORHT 6 SPLT 77X108 (DRAPES) ×2 IMPLANT
DRSG XEROFORM 1X8 (GAUZE/BANDAGES/DRESSINGS) ×1 IMPLANT
ELECT REM PT RETURN 9FT ADLT (ELECTROSURGICAL) ×2
ELECTRODE REM PT RTRN 9FT ADLT (ELECTROSURGICAL) ×1 IMPLANT
GAUZE SPONGE 4X4 12PLY STRL (GAUZE/BANDAGES/DRESSINGS) ×2 IMPLANT
GAUZE XEROFORM 1X8 LF (GAUZE/BANDAGES/DRESSINGS) ×2 IMPLANT
GLOVE SURG POLYISO LF SZ8 (GLOVE) ×2 IMPLANT
GOWN STRL REUS W/ TWL LRG LVL3 (GOWN DISPOSABLE) ×2 IMPLANT
GOWN STRL REUS W/ TWL XL LVL3 (GOWN DISPOSABLE) ×1 IMPLANT
GOWN STRL REUS W/TWL LRG LVL3 (GOWN DISPOSABLE) ×4
GOWN STRL REUS W/TWL XL LVL3 (GOWN DISPOSABLE) ×2
KIT BASIN OR (CUSTOM PROCEDURE TRAY) ×2 IMPLANT
KIT TURNOVER KIT B (KITS) ×2 IMPLANT
NS IRRIG 1000ML POUR BTL (IV SOLUTION) ×2 IMPLANT
PACK GENERAL/GYN (CUSTOM PROCEDURE TRAY) ×2 IMPLANT
PAD ARMBOARD 7.5X6 YLW CONV (MISCELLANEOUS) ×4 IMPLANT
STAPLER VISISTAT 35W (STAPLE) ×2 IMPLANT
SUT ETHILON 2 0 PSLX (SUTURE) ×2 IMPLANT
SUT ETHILON 3 0 PS 1 (SUTURE) ×2 IMPLANT
SUT SILK 2 0SH CR/8 30 (SUTURE) IMPLANT
SUT VIC AB 2-0 CT1 18 (SUTURE) ×2 IMPLANT
SUT VIC AB 3-0 SH 8-18 (SUTURE) ×2 IMPLANT
TOWEL GREEN STERILE (TOWEL DISPOSABLE) ×4 IMPLANT
TOWEL GREEN STERILE FF (TOWEL DISPOSABLE) ×2 IMPLANT
UNDERPAD 30X36 HEAVY ABSORB (UNDERPADS AND DIAPERS) ×2 IMPLANT
WATER STERILE IRR 1000ML POUR (IV SOLUTION) ×2 IMPLANT

## 2021-05-04 NOTE — Anesthesia Procedure Notes (Signed)
Procedure Name: LMA Insertion Date/Time: 05/04/2021 10:36 AM Performed by: Reece Agar, CRNA Pre-anesthesia Checklist: Patient identified, Emergency Drugs available, Suction available and Patient being monitored Patient Re-evaluated:Patient Re-evaluated prior to induction Oxygen Delivery Method: Circle System Utilized Preoxygenation: Pre-oxygenation with 100% oxygen Induction Type: IV induction Ventilation: Mask ventilation without difficulty LMA: LMA inserted LMA Size: 4.0 Number of attempts: 1 Airway Equipment and Method: Bite block Placement Confirmation: positive ETCO2 Tube secured with: Tape Dental Injury: Teeth and Oropharynx as per pre-operative assessment

## 2021-05-04 NOTE — Progress Notes (Signed)
Mobility Specialist: Progress Note   05/04/21 1742  Mobility  Activity Stood at bedside  Level of Assistance Minimal assist, patient does 75% or more  Assistive Device [employer] Ambulated (ft) 2 ft  Mobility Out of bed to chair with meals  Mobility Response Tolerated well  Mobility performed by Mobility specialist  $Mobility charge 1 Mobility   Pre-Mobility: 107 HR, 100% SpO2 Post-Mobility: 123 HR, 100% SpO2  Pt c/o 8/10 pain in L foot during session. Pt was able to stand at EOB and take two small steps forward and then back to EOB before needing to sit. Pt was able to lateral scoot towards the head of the bed and is independent with bed mobility. Pt is back in the bed with the bed alarm on and call bell at his side.   [facility] Roger Vazquez Mobility Specialist Mobility Specialist Phone: [phone]

## 2021-05-04 NOTE — Progress Notes (Addendum)
Family Medicine Teaching Service Daily Progress Note Intern Pager: [phone]  Patient name: Roger Vazquez Medical record number: [id number] Date of birth: 07/30/1951 Age: 70 y.o. Gender: male  Primary Care Provider: [facility] Consultants: VVS, Nephro Code Status: Full  Pt Overview and Major Events to Date:  7/14- admitted 7/15- arteriogram of LLE with multi-vessel angioplasty 7/21- scheduled for L second toe amputation  Assessment and Plan: Roger Vazquez is a 70 year old male with history of type 2 diabetes, hypertension, ESRD on HD Monday Wednesday Friday, history of lung cancer, who presented with gangrene of the left second toe with overlying cellulitis.  Now is postop day 6 of LLE angiogram with multivessel angioplasty and plans to return to the OR today for removal of the gangrenous toe.  L Second Toe Gangrene Cellulitis PAD Postop day 6 of multivessel angioplasty.  Date 8 of antibiotics.  To OR today with vascular for amputation. - OR today - Continue Keflex (day 8), defer to VVS for postoperative antibiotic recs - Continue current pain regimen, scheduled Tylenol, PRN Dilaudid - ASA, Plavix  ESRD on HD MWF Dialyzed yesterday without issue. - Dialysis per nephro - Continue PhosLo  Constipation, resolved Had a "good" BM yesterday.  Nonbloody, well formed.  Anticipate that he may have some further constipation with postoperative pain medication after surgery today. - Continue MiraLAX, Senna  Diabetes Glucoses 133-206. - Continue Lantus 10u  HTN  Well-controlled - Continue hydralazine 50mg  every 8 hours  FEN/GI: NPO for surgery, restart renal diet s/p surgery PPx: Subcu heparin Dispo:Home tomorrow. Barriers include surgery today, postop PT eval.   Subjective:  Roger Vazquez reports having no complaints today.  He feels prepared for surgery this afternoon.  Says he feels otherwise well.  Had a bowel movement yesterday.  Objective: Temp:  [97.7 F (36.5  C)-98.7 F (37.1 C)] 97.7 F (36.5 C) (07/21 0740) Pulse Rate:  [81-92] 81 (07/21 0740) Resp:  [12-22] 19 (07/21 0402) BP: (80-150)/(46-84) 130/69 (07/21 0740) SpO2:  [99 %-100 %] 99 % (07/21 0402) Weight:  [64.1 kg] 64.1 kg (07/20 1530) Physical Exam: General: Well-appearing older gentleman resting comfortably in bed, arouses easily to voice Cardiovascular: Regular rate, regular rhythm, 2 out of 6 systolic murmur Respiratory: Lungs clear to auscultation bilaterally, normal work of breathing on room air Abdomen: Soft, nontender, nondistended Extremities: Right upper extremity with palpable thrill from AV fistula. Left lower extremity exam unchanged from previous, necrotic second toe, healing ulcer of first toe without drainage, barely palpable pulses  Laboratory: Recent Labs  Lab 04/30/21 0109 05/01/21 0205 05/04/21 0234  WBC 6.5 7.1 7.6  HGB 10.4* 10.7* 10.7*  HCT 31.2* 31.4* 31.0*  PLT 260 254 247   Recent Labs  Lab 04/27/21 1229 04/28/21 0202 04/30/21 0827 05/01/21 0205 05/04/21 0234  NA 137 136 133* 133* 132*  K 3.7 3.8 4.7 4.0 4.9  CL 95* 99 95* 94* 94*  CO2 29 28 24 26 28   BUN 23 28* 36* 50* 25*  CREATININE 7.45* 7.97* 7.79* 9.41* 5.11*  CALCIUM 8.9 8.6* 9.0 8.9 9.0  PROT 8.5* 7.3  --   --   --   BILITOT 0.4 0.3  --   --   --   ALKPHOS 88 74  --   --   --   ALT 9 9  --   --   --   AST 10* 7*  --   --   --   GLUCOSE 206* 168* 196* 174* 133*  Imaging/Diagnostic Tests: No new imaging/tests  Eppie Gibson, MD 05/04/2021, 9:27 AM PGY-1, [place] Intern pager: [phone], text pages welcome

## 2021-05-04 NOTE — Transfer of Care (Signed)
Immediate Anesthesia Transfer of Care Note  Patient: Roger Vazquez  Procedure(s) Performed: LEFT SECOND TOE AMPUTATION (Left: Foot)  Patient Location: PACU  Anesthesia Type:General  Level of Consciousness: awake and alert   Airway & Oxygen Therapy: Patient Spontanous Breathing and Patient connected to face mask oxygen  Post-op Assessment: Report given to RN and Post -op Vital signs reviewed and stable  Post vital signs: Reviewed and stable  Last Vitals:  Vitals Value Taken Time  BP 125/74 05/04/21 1116  Temp    Pulse 76 05/04/21 1119  Resp 15 05/04/21 1119  SpO2 100 % 05/04/21 1119  Vitals shown include unvalidated device data.  Last Pain:  Vitals:   05/04/21 0952  TempSrc: Oral  PainSc: 0-No pain      Patients Stated Pain Goal: 0 (09/32/67 1245)  Complications: No notable events documented.

## 2021-05-04 NOTE — Op Note (Signed)
DATE OF SERVICE: 05/04/2021  PATIENT:  Roger Vazquez  70 y.o. male  PRE-OPERATIVE DIAGNOSIS:  dry gangrene of left second toe, ulceration of great toe and third left toe   POST-OPERATIVE DIAGNOSIS:  Same  PROCEDURE:   left second toe amputation sharp debridement of left great toe and third toe  SURGEON:  Surgeon(s) and Role:    * Cherre Robins, MD - Primary  ASSISTANT: none  ANESTHESIA:   general  EBL: 51mL  BLOOD ADMINISTERED:none  DRAINS: none   LOCAL MEDICATIONS USED:  NONE  SPECIMEN:  none  COUNTS: confirmed correct.  TOURNIQUET:  none  PATIENT DISPOSITION:  PACU - hemodynamically stable.   Delay start of Pharmacological VTE agent (>24hrs) due to surgical blood loss or risk of bleeding: no  INDICATION FOR PROCEDURE: Roger Vazquez is a 71 y.o. male with left second toe dry gangrene and ulceration of the great toe and third toe.. After careful discussion of risks, benefits, and alternatives the patient was offered second toe amputation. The patient understood and wished to proceed.  OPERATIVE FINDINGS: Moderate bleeding encountered at the resection margin.  Second toe amputated without issue.  Toe and third toe dry eschar debrided to healthy bleeding tissue.  DESCRIPTION OF PROCEDURE: After identification of the patient in the pre-operative holding area, the patient was transferred to the operating room. The patient was positioned supine on the operating room table. Anesthesia was induced. The left foot was prepped and draped in standard fashion. A surgical pause was performed confirming correct patient, procedure, and operative location.  A "tennis racquet" incision was made about the second toe.  This was carried down to bone.  The toe was amputated at the MTP joint.  The bone was debrided back to allow generous flap closure of the toe amputation.  The small areas of ulceration on the medial third toe and anterior great toe were sharply debrided to bleeding  tissue.  The entire area was about 2 x 2 cm in aggregate.  The toe amputation wound was closed in layers using 3-0 nylon. Xeroform, gauze, kerlix, ace wrap were applied to the foot.  Upon completion of the case instrument and sharps counts were confirmed correct. The patient was transferred to the PACU in good condition. I was present for all portions of the procedure.  [name]. Stanford Breed, MD [facility] Phone Number: [phone] 05/04/2021 11:05 AM

## 2021-05-04 NOTE — Progress Notes (Signed)
[facility] [place] PROGRESS NOTE  ASSESSMENT / PLAN: Roger Vazquez is a 70 y.o. male with left second toe gangrene after L popliteal / PT angioplasty. For second toe amputation today in OR.  SUBJECTIVE: No complaints. All questions answered.  OBJECTIVE: BP (!) 155/92   Pulse 86   Temp 98.2 F (36.8 C) (Oral)   Resp 18   Ht 5\' 6"  (1.676 m)   Wt 64.1 kg   SpO2 100%   BMI 22.81 kg/m   Intake/Output Summary (Last 24 hours) at 05/04/2021 1005 Last data filed at 05/03/2021 1915 Gross per 24 hour  Intake --  Output 996 ml  Net -996 ml    No distress Unchanged appearance of left second toe  CBC Latest Ref Rng & Units 05/04/2021 05/01/2021 04/30/2021  WBC 4.0 - 10.5 K/uL 7.6 7.1 6.5  Hemoglobin 13.0 - 17.0 g/dL 10.7(L) 10.7(L) 10.4(L)  Hematocrit 39.0 - 52.0 % 31.0(L) 31.4(L) 31.2(L)  Platelets 150 - 400 K/uL 247 254 260     CMP Latest Ref Rng & Units 05/04/2021 05/01/2021 04/30/2021  Glucose 70 - 99 mg/dL 133(H) 174(H) 196(H)  BUN 8 - 23 mg/dL 25(H) 50(H) 36(H)  Creatinine 0.61 - 1.24 mg/dL 5.11(H) 9.41(H) 7.79(H)  Sodium 135 - 145 mmol/L 132(L) 133(L) 133(L)  Potassium 3.5 - 5.1 mmol/L 4.9 4.0 4.7  Chloride 98 - 111 mmol/L 94(L) 94(L) 95(L)  CO2 22 - 32 mmol/L 28 26 24   Calcium 8.9 - 10.3 mg/dL 9.0 8.9 9.0  Total Protein 6.5 - 8.1 g/dL - - -  Total Bilirubin 0.3 - 1.2 mg/dL - - -  Alkaline Phos 38 - 126 U/L - - -  AST 15 - 41 U/L - - -  ALT 0 - 44 U/L - - -    Estimated Creatinine Clearance: 12 mL/min (A) (by C-G formula based on SCr of 5.11 mg/dL (H)).  [name]. Stanford Breed, MD [facility] Phone Number: [phone] 05/04/2021 10:05 AM

## 2021-05-04 NOTE — Progress Notes (Signed)
FPTS Brief Progress Note  S:Patient is sleeping comfortably supine in his bed. He has a MEW score of 1   O: BP 123/60   Pulse 86   Temp 98.7 F (37.1 C) (Oral)   Resp 20   Ht 5\' 6"  (1.676 m)   Wt 64.1 kg   SpO2 100%   BMI 22.81 kg/m  General: Sleeping supine, well appearing, NAD  Respiratory: Breathing comfortably on room air    A/P: Gangrenous left Second toe Patient is currently NPO with amputation procedure scheduled for later today with podiatry. -Continue ASA and Plavix -Continue antibiotics of Cefepime and Vancomycin  -Pain control:Dilaudid 1mg  q3h prn, Tylenol 500mg  q6h  Alen Bleacher, MD 05/04/2021, 2:10 AM PGY-1, [place] [facility] Night Resident  Please page [phone] with questions.

## 2021-05-04 NOTE — Progress Notes (Addendum)
[facility] Progress Note   Subjective:  Seen in room prior to surgery this morning. No overnight issues, no CP or dyspnea. For L 2nd toe amputation today.  Objective Vitals:   05/04/21 0444 05/04/21 0740 05/04/21 0952 05/04/21 1115  BP: (!) 148/78 130/69 (!) 155/92 125/74  Pulse:  81 86 71  Resp:   18 (!) 9  Temp:  97.7 F (36.5 C) 98.2 F (36.8 C) 98 F (36.7 C)  TempSrc:  Oral Oral   SpO2:   100% 100%  Weight:      Height:       Physical Exam General: Well appearing man - NAD. Room air. Heart: RRR; no murmur Lungs: CTA anteriorly Abdomen: distended, non-tender Extremities: No LE edema; L foot with necrotic 2nd toe, no drainage/odor Dialysis Access: R AVG + bruit  Additional Objective Labs: Basic Metabolic Panel: Recent Labs  Lab 04/30/21 0827 05/01/21 0205 05/04/21 0234  NA 133* 133* 132*  K 4.7 4.0 4.9  CL 95* 94* 94*  CO2 24 26 28   GLUCOSE 196* 174* 133*  BUN 36* 50* 25*  CREATININE 7.79* 9.41* 5.11*  CALCIUM 9.0 8.9 9.0   Liver Function Tests: Recent Labs  Lab 04/27/21 1229 04/28/21 0202  AST 10* 7*  ALT 9 9  ALKPHOS 88 74  BILITOT 0.4 0.3  PROT 8.5* 7.3  ALBUMIN 3.6 3.0*   CBC: Recent Labs  Lab 04/27/21 1229 04/28/21 0202 04/29/21 0201 04/30/21 0109 05/01/21 0205 05/04/21 0234  WBC 8.9 7.5 8.1 6.5 7.1 7.6  NEUTROABS 6.7  --   --   --   --   --   HGB 11.3* 10.3* 11.1* 10.4* 10.7* 10.7*  HCT 34.4* 32.1* 33.3* 31.2* 31.4* 31.0*  MCV 106.8* 107.4* 103.7* 104.7* 103.3* 102.6*  PLT 295 254 257 260 254 247   Medications:  [MAR Hold] sodium chloride     sodium chloride 10 mL/hr at 05/04/21 1003    [MAR Hold] acetaminophen  500 mg Oral Q6H   [MAR Hold] aspirin EC  81 mg Oral Daily   [MAR Hold] atorvastatin  40 mg Oral Daily   [MAR Hold] calcium acetate  667 mg Oral TID WC   [MAR Hold] clopidogrel  75 mg Oral Daily   [MAR Hold] doxercalciferol  2 mcg Intravenous Q M,W,F-HD   [MAR Hold] feeding supplement (NEPRO CARB  STEADY)  237 mL Oral BID BM   [MAR Hold] heparin  5,000 Units Subcutaneous Q8H   [MAR Hold] hydrALAZINE  50 mg Oral Q8H   [MAR Hold] insulin aspart  0-6 Units Subcutaneous TID WC   [MAR Hold] insulin glargine  10 Units Subcutaneous Daily   [MAR Hold] polyethylene glycol  17 g Oral BID   [MAR Hold] senna  1 tablet Oral Daily   [MAR Hold] sodium chloride flush  3 mL Intravenous Q12H    Dialysis Orders: MWF at [facility] 3hr, EDW 60.5kg, 2K/3Ca, AVG, heparin 4000 - Hectoral 65mcg IV q HD - Mircera 46mcg IV q 2 weeks - last 7/13   Assessment/Plan: 1. L 2nd toe gangrene: VVS consulted, s/p PTA to L popliteal and L PT arteries on 7/15 by Dr. Stanford Breed. Pain improved. For L 2nd toe amputation today. Finishing course of PO Keflex. 2. ESRD: Continue HD per usual MWF schedule ->next HD tomorrow. 3. HTN/volume: BP variable, at EDW. Challenge as tolerated. 4. Anemia: Hgb 10.7, stable without ESA for now. 5. Secondary hyperparathyroidism: Ca ok, Phos pending. Continue home binders + VDRA. 6.  Nutrition:  Alb low, continue supplements. 7. T2DM 8. Hep C +    Veneta Penton, PA-C 05/04/2021, 11:22 AM  [facility]  I have seen and examined this patient and agree with plan and assessment in the above note with renal recommendations/intervention highlighted.  Broadus John A Kaisey Huseby,MD 05/04/2021 11:37 AM

## 2021-05-04 NOTE — Anesthesia Preprocedure Evaluation (Addendum)
Anesthesia Evaluation  Patient identified by MRN, date of birth, ID band Patient awake    Reviewed: Allergy & Precautions, H&P , NPO status , Patient's Chart, lab work & pertinent test results  Airway Mallampati: II   Neck ROM: full    Dental   Pulmonary former smoker,    breath sounds clear to auscultation       Cardiovascular hypertension,  Rhythm:regular Rate:Normal     Neuro/Psych    GI/Hepatic GERD  ,(+)     substance abuse  alcohol use, Hepatitis -, C  Endo/Other  diabetes, Type 2, Insulin Dependent  Renal/GU ESRF and DialysisRenal disease     Musculoskeletal   Abdominal   Peds  Hematology  (+) Blood dyscrasia, anemia ,   Anesthesia Other Findings   Reproductive/Obstetrics                             Anesthesia Physical Anesthesia Plan  ASA: 4  Anesthesia Plan: General   Post-op Pain Management:    Induction: Intravenous  PONV Risk Score and Plan: 2 and Treatment may vary due to age or medical condition, Ondansetron and Dexamethasone  Airway Management Planned: LMA  Additional Equipment:   Intra-op Plan:   Post-operative Plan: Extubation in OR  Informed Consent: I have reviewed the patients History and Physical, chart, labs and discussed the procedure including the risks, benefits and alternatives for the proposed anesthesia with the patient or authorized representative who has indicated his/her understanding and acceptance.       Plan Discussed with: CRNA, Anesthesiologist and Surgeon  Anesthesia Plan Comments:        Anesthesia Quick Evaluation

## 2021-05-04 NOTE — Progress Notes (Signed)
Orthopedic Tech Progress Note Patient Details:  Roger Vazquez 1951/04/01 [id number]  PACU RN called requesting a DARCO SHOE  Ortho Devices Type of Ortho Device: Darco shoe Ortho Device/Splint Location: LLE Ortho Device/Splint Interventions: Application, Adjustment, Ordered   Post Interventions Patient Tolerated: Well Instructions Provided: Care of device  Janit Pagan 05/04/2021, 11:41 AM

## 2021-05-05 ENCOUNTER — Other Ambulatory Visit: Payer: Self-pay | Admitting: Family Medicine

## 2021-05-05 ENCOUNTER — Encounter (HOSPITAL_COMMUNITY): Payer: Self-pay | Admitting: Vascular Surgery

## 2021-05-05 LAB — RENAL FUNCTION PANEL
Albumin: 3.5 g/dL (ref 3.5–5.0)
Anion gap: 18 — ABNORMAL HIGH (ref 5–15)
BUN: 49 mg/dL — ABNORMAL HIGH (ref 8–23)
CO2: 22 mmol/L (ref 22–32)
Calcium: 9.6 mg/dL (ref 8.9–10.3)
Chloride: 91 mmol/L — ABNORMAL LOW (ref 98–111)
Creatinine, Ser: 7.5 mg/dL — ABNORMAL HIGH (ref 0.61–1.24)
GFR, Estimated: 7 mL/min — ABNORMAL LOW
Glucose, Bld: 170 mg/dL — ABNORMAL HIGH (ref 70–99)
Phosphorus: 3.4 mg/dL (ref 2.5–4.6)
Potassium: 5 mmol/L (ref 3.5–5.1)
Sodium: 131 mmol/L — ABNORMAL LOW (ref 135–145)

## 2021-05-05 LAB — CBC
HCT: 30.7 % — ABNORMAL LOW (ref 39.0–52.0)
HCT: 35.6 % — ABNORMAL LOW (ref 39.0–52.0)
Hemoglobin: 10.3 g/dL — ABNORMAL LOW (ref 13.0–17.0)
Hemoglobin: 11.6 g/dL — ABNORMAL LOW (ref 13.0–17.0)
MCH: 34.4 pg — ABNORMAL HIGH (ref 26.0–34.0)
MCH: 34.5 pg — ABNORMAL HIGH (ref 26.0–34.0)
MCHC: 33.6 g/dL (ref 30.0–36.0)
MCHC: 32.6 g/dL (ref 30.0–36.0)
MCV: 102.7 fL — ABNORMAL HIGH (ref 80.0–100.0)
MCV: 106 fL — ABNORMAL HIGH (ref 80.0–100.0)
Platelets: 271 K/uL (ref 150–400)
Platelets: 274 10*3/uL (ref 150–400)
RBC: 2.99 MIL/uL — ABNORMAL LOW (ref 4.22–5.81)
RBC: 3.36 MIL/uL — ABNORMAL LOW (ref 4.22–5.81)
RDW: 14 % (ref 11.5–15.5)
RDW: 14.5 % (ref 11.5–15.5)
WBC: 11.3 K/uL — ABNORMAL HIGH (ref 4.0–10.5)
WBC: 13.6 10*3/uL — ABNORMAL HIGH (ref 4.0–10.5)
nRBC: 0 % (ref 0.0–0.2)
nRBC: 0 % (ref 0.0–0.2)

## 2021-05-05 LAB — BASIC METABOLIC PANEL WITH GFR
Anion gap: 11 (ref 5–15)
BUN: 45 mg/dL — ABNORMAL HIGH (ref 8–23)
CO2: 27 mmol/L (ref 22–32)
Calcium: 9.2 mg/dL (ref 8.9–10.3)
Chloride: 93 mmol/L — ABNORMAL LOW (ref 98–111)
Creatinine, Ser: 7.02 mg/dL — ABNORMAL HIGH (ref 0.61–1.24)
GFR, Estimated: 8 mL/min — ABNORMAL LOW
Glucose, Bld: 154 mg/dL — ABNORMAL HIGH (ref 70–99)
Potassium: 5.1 mmol/L (ref 3.5–5.1)
Sodium: 131 mmol/L — ABNORMAL LOW (ref 135–145)

## 2021-05-05 LAB — GLUCOSE, CAPILLARY
Glucose-Capillary: 152 mg/dL — ABNORMAL HIGH (ref 70–99)
Glucose-Capillary: 267 mg/dL — ABNORMAL HIGH (ref 70–99)

## 2021-05-05 MED ORDER — OXYCODONE-ACETAMINOPHEN 5-325 MG PO TABS: 1.0000 | ORAL_TABLET | ORAL | 0 refills | Status: DC | PRN

## 2021-05-05 MED ORDER — CLOPIDOGREL BISULFATE 75 MG PO TABS: 75.0000 mg | ORAL_TABLET | Freq: Every day | ORAL | 0 refills | Status: DC

## 2021-05-05 MED ORDER — HYDROMORPHONE HCL 2 MG PO TABS
1.0000 mg | ORAL_TABLET | ORAL | Status: DC
  Administered 2021-05-05 (×4): 1 mg via ORAL
  Filled 2021-05-05 (×4): qty 1

## 2021-05-05 MED ORDER — SENNA 8.6 MG PO TABS: 1.0000 | ORAL_TABLET | Freq: Every day | ORAL | 0 refills | Status: DC

## 2021-05-05 MED ORDER — DOXERCALCIFEROL 4 MCG/2ML IV SOLN
INTRAVENOUS | Status: AC
  Administered 2021-05-05: 2 ug via INTRAVENOUS
  Filled 2021-05-05: qty 2

## 2021-05-05 MED ORDER — POLYETHYLENE GLYCOL 3350 17 G PO PACK
17.0000 g | PACK | Freq: Two times a day (BID) | ORAL | 0 refills | Status: DC
Start: 2021-05-05 — End: 2021-12-11

## 2021-05-05 MED ORDER — ACETAMINOPHEN 500 MG PO TABS: 500.0000 mg | ORAL_TABLET | Freq: Four times a day (QID) | ORAL | 0 refills | Status: DC

## 2021-05-05 MED ORDER — MELATONIN 3 MG PO TABS: 3.0000 mg | ORAL_TABLET | Freq: Every day | ORAL | Status: DC

## 2021-05-05 NOTE — Progress Notes (Signed)
OT Cancellation Note  Patient Details Name: [name] MRN: [id number] DOB: Jul 08, 1951   Cancelled Treatment:    Reason Eval/Treat Not Completed: Patient at procedure or test/ unavailable. Pt off floor at Dialysis, OT will follow up as time allows.   Toba Claudio H., OTR/L Acute Rehabilitation  Tayo Maute Elane Yolanda Bonine 05/05/2021, 5:44 PM

## 2021-05-05 NOTE — Care Management Important Message (Signed)
Important Message  Patient Details  Name: Roger Vazquez MRN: [id number] Date of Birth: [birth year]   Medicare Important Message Given:  Yes     Shelda Altes 05/05/2021, 11:48 AM

## 2021-05-05 NOTE — Discharge Instructions (Addendum)
Dear Roger Vazquez,  Thank you for letting us participate in your care. You were hospitalized for pain and skin changes in your foot and diagnosed with gangrene of your left second toe and surrounding cellulitis. You were treated with IV antibiotics and amputation of the affected toe.   POST-HOSPITAL & CARE INSTRUCTIONS We are sending you home with post-operative pain medications, please take these as prescribed 2.   Please change the dressing on your foot once daily. Clean the area gently with soap and water and pat dry before putting on a bandage. 3.   Please use the provided boot when you walk for the first several days after surgery and try to keep all of your weight on your heel.  4.   Go to your follow up appointments (listed below)   DOCTOR'S APPOINTMENT   Future Appointments  Date Time Provider [place]  05/30/2021  2:00 PM [facility] VASC 4 - SS [facility] VVS  05/30/2021  2:30 PM [facility] VASC 4 - SS [facility] VVS  05/30/2021  3:00 PM [facility]    Follow-up Information     Dagoberto Ligas, PA-C. Schedule an appointment as soon as possible for a visit in 3 week(s).   Specialty: Physician Assistant Why: You need to call the vascular surgery clinic to make a follow up appointment for 3-4 weeks after hospital discharge. At this time, you will have suture removal and LLE arterial duplex and ABI. Contact information: [address] [place] [address] [facility] [place] Schedule an appointment as soon as possible for a visit in 1 week(s).   Why: Make a hospital follow up appointment with your primary care clinic for about 1 week after discharge. Contact information: [place] [address] [phone]                 Take care and be well!  [facility]  [place], [place] [address] [phone]

## 2021-05-05 NOTE — Anesthesia Postprocedure Evaluation (Signed)
Anesthesia Post Note  Patient: Roger Vazquez  Procedure(s) Performed: LEFT SECOND TOE AMPUTATION (Left: Foot)     Patient location during evaluation: PACU Anesthesia Type: General Level of consciousness: awake and alert Pain management: pain level controlled Vital Signs Assessment: post-procedure vital signs reviewed and stable Respiratory status: spontaneous breathing, nonlabored ventilation, respiratory function stable and patient connected to nasal cannula oxygen Cardiovascular status: blood pressure returned to baseline and stable Postop Assessment: no apparent nausea or vomiting Anesthetic complications: no   No notable events documented.  Last Vitals:  Vitals:   05/05/21 0300 05/05/21 0551  BP: (!) 165/82 (!) 162/85  Pulse: 85   Resp: 18   Temp: 36.6 C   SpO2: 99%     Last Pain:  Vitals:   05/05/21 0300  TempSrc: Oral  PainSc:                  Letcher S

## 2021-05-05 NOTE — Evaluation (Signed)
Physical Therapy Evaluation Patient Details Name: Roger Vazquez MRN: [id number] DOB: 1951/04/24 Today's Date: 05/05/2021   History of Present Illness  Mr. Talent is a 70 year old male with history of type 2 diabetes, hypertension, ESRD on HD Monday Wednesday Friday, history of lung cancer, who presented with gangrene of the second left toe with overlying cellulitis.  Now is postop day 7 of LLE angiogram with multivessel angioplasty and postop day 1 of left second toe amputation.   Clinical Impression  Patient received in bed, states he walked to the bathroom with nursing. He wants to go home. Patient is mod independent with bed mobility and transfers. Initially ambulated with SPC. Required hand held assist when using cane for stability and balance. Switched to RW and patient demonstrates improved balance and ability. Recommend patient use walker until foot has healed more.  He needed assistance donning darco shoe, but is aware he needs to wear it and keep WB through heel. Patient will continue to benefit from skilled PT while here to improve functional independence and safety.       Follow Up Recommendations No PT follow up    Equipment Recommendations  None recommended by PT    Recommendations for Other Services       Precautions / Restrictions Precautions Precautions: Fall Precaution Comments: mod fall Restrictions Other Position/Activity Restrictions: Heel weight bearing only L LE      Mobility  Bed Mobility Overal bed mobility: Modified Independent                  Transfers Overall transfer level: Modified independent Equipment used: Straight cane                Ambulation/Gait Ambulation/Gait assistance: Min guard Gait Distance (Feet): 25 Feet Assistive device: Straight cane;Rolling walker (2 wheeled);1 person hand held assist Gait Pattern/deviations: Step-to pattern;Decreased stance time - left Gait velocity: decr   General Gait Details: patient  initially ambulated with SPC, and hand held assist on opposite side. After 10 feet, switched to RW. Patient with much improved balance and ability when using RW.  Stairs            Wheelchair Mobility    Modified Rankin (Stroke Patients Only)       Balance Overall balance assessment: Modified Independent                                           Pertinent Vitals/Pain Pain Assessment: Faces Faces Pain Scale: Hurts little more Pain Location: L foot Pain Descriptors / Indicators: Discomfort;Grimacing;Moaning Pain Intervention(s): Limited activity within patient's tolerance;Monitored during session;Patient requesting pain meds-RN notified;Repositioned    Home Living Family/patient expects to be discharged to:: Private residence Living Arrangements: Alone Available Help at Discharge: Family;Available PRN/intermittently Type of Home: House Home Access: Level entry     Home Layout: One level Home Equipment: Walker - 2 wheels;Cane - single point      Prior Function Level of Independence: Independent         Comments: Reports he is independent at home, drives, sister nearby and can assist as needed     Hand Dominance        Extremity/Trunk Assessment   Upper Extremity Assessment Upper Extremity Assessment: Overall WFL for tasks assessed    Lower Extremity Assessment Lower Extremity Assessment: LLE deficits/detail LLE: Unable to fully assess due to pain    Cervical /  Trunk Assessment Cervical / Trunk Assessment: Normal  Communication   Communication: No difficulties  Cognition Arousal/Alertness: Awake/alert Behavior During Therapy: WFL for tasks assessed/performed;Flat affect Overall Cognitive Status: Within Functional Limits for tasks assessed                                 General Comments: not very forthcoming with information. Yes/ no answers.      General Comments      Exercises     Assessment/Plan    PT  Assessment Patient needs continued PT services  PT Problem List Decreased strength;Decreased mobility;Decreased activity tolerance;Decreased balance;Pain;Decreased knowledge of precautions       PT Treatment Interventions DME instruction;Therapeutic exercise;Gait training;Balance training;Functional mobility training;Therapeutic activities;Patient/family education    PT Goals (Current goals can be found in the Care Plan section)  Acute Rehab PT Goals Patient Stated Goal: go home PT Goal Formulation: With patient Time For Goal Achievement: 05/12/21 Potential to Achieve Goals: Good    Frequency Min 3X/week   Barriers to discharge Decreased caregiver support      Co-evaluation               AM-PAC PT "6 Clicks" Mobility  Outcome Measure Help needed turning from your back to your side while in a flat bed without using bedrails?: None Help needed moving from lying on your back to sitting on the side of a flat bed without using bedrails?: None Help needed moving to and from a bed to a chair (including a wheelchair)?: A Little Help needed standing up from a chair using your arms (e.g., wheelchair or bedside chair)?: None Help needed to walk in hospital room?: A Little Help needed climbing 3-5 steps with a railing? : A Lot 6 Click Score: 20    End of Session Equipment Utilized During Treatment: Gait belt Activity Tolerance: Patient limited by pain Patient left: in bed;with call bell/phone within reach;with nursing/sitter in room Nurse Communication: Mobility status PT Visit Diagnosis: Other abnormalities of gait and mobility (R26.89);Pain;Difficulty in walking, not elsewhere classified (R26.2);Unsteadiness on feet (R26.81) Pain - Right/Left: Left Pain - part of body: Ankle and joints of foot    Time: 1040-1053 PT Time Calculation (min) (ACUTE ONLY): 13 min   Charges:   PT Evaluation $PT Eval Moderate Complexity: 1 Mod          Nikodem Leadbetter, PT,  [facility] 05/05/21,11:11 AM

## 2021-05-05 NOTE — Progress Notes (Signed)
  Progress Note    05/05/2021 7:46 AM 1 Day Post-Op  Subjective:  on and off pain at L 2nd toe amp site   Vitals:   05/05/21 0300 05/05/21 0551  BP: (!) 165/82 (!) 162/85  Pulse: 85   Resp: 18   Temp: 97.9 F (36.6 C)   SpO2: 99%    Physical Exam: Lungs:  non labored Incisions:  L 2nd toe amp site with viable skin edges; sutures intact, no bleeding Extremities:  L foot warm and well perfused Abdomen:  soft Neurologic: A&O  CBC    Component Value Date/Time   WBC 11.3 (H) 05/05/2021 0201   RBC 2.99 (L) 05/05/2021 0201   HGB 10.3 (L) 05/05/2021 0201   HCT 30.7 (L) 05/05/2021 0201   PLT 271 05/05/2021 0201   MCV 102.7 (H) 05/05/2021 0201   MCH 34.4 (H) 05/05/2021 0201   MCHC 33.6 05/05/2021 0201   RDW 14.0 05/05/2021 0201   LYMPHSABS 1.4 04/27/2021 1229   MONOABS 0.7 04/27/2021 1229   EOSABS 0.1 04/27/2021 1229   BASOSABS 0.0 04/27/2021 1229    BMET    Component Value Date/Time   NA 131 (L) 05/05/2021 0201   K 5.1 05/05/2021 0201   CL 93 (L) 05/05/2021 0201   CO2 27 05/05/2021 0201   GLUCOSE 154 (H) 05/05/2021 0201   BUN 45 (H) 05/05/2021 0201   CREATININE 7.02 (H) 05/05/2021 0201   CALCIUM 9.2 05/05/2021 0201   CALCIUM 7.1 (L) 08/01/2018 0944   GFRNONAA 8 (L) 05/05/2021 0201   GFRAA 5 (L) 08/11/2018 0858    INR    Component Value Date/Time   INR 1.0 04/27/2021 1229     Intake/Output Summary (Last 24 hours) at 05/05/2021 0746 Last data filed at 05/05/2021 0354 Gross per 24 hour  Intake 440 ml  Output 120 ml  Net 320 ml     Assessment/Plan:  70 y.o. male is s/p L 2nd toe amputation 1 Day Post-Op   -Dressing changed today; encouraged daily dressing changes and cleansing with soap and water; pat dry after cleansing -heel weight bearing only; darco shoe is in the room -ok for discharge from vascular surgery standpoint; follow up in 3-4 weeks for suture removal and LLE arterial duplex and ABI   Dagoberto Ligas, PA-C [facility] [phone] 05/05/2021 7:46 AM

## 2021-05-05 NOTE — Progress Notes (Signed)
Family Medicine Teaching Service Daily Progress Note Intern Pager: [phone]  Patient name: Roger Vazquez Medical record number: [id number] Date of birth: [birth year] Age: 70 y.o. Gender: male  Primary Care Provider: [facility] Consultants: Vascular surgery, nephrology Code Status: Full  Pt Overview and Major Events to Date:  7/14- admitted 7/15- arteriogram of LLE with multi-vessel angioplasty 7/21- L second toe amputation  Assessment and Plan: Roger Vazquez is a 70 year old male with history of type 2 diabetes, hypertension, ESRD on HD Monday Wednesday Friday, history of lung cancer, who presented with gangrene of the second left toe with overlying cellulitis.  Now is postop day 7 of LLE angiogram with multivessel angioplasty and postop day 1 of left second toe amputation.  L Second Toe Gangrene s/p amputation Cellulitis PAD Postop day 1 after amputation of left second toe. Day 9 abx. Doing well.  Should be stable for discharge. -Imminent discharge PT evaluation -If able to ambulate with PT, will discharge -Okay to DC Keflex -Will send home with Percocet for pain -Continue ASA, Plavix -Follow-up with vascular surgery outpatient in 4 weeks  ESRD on HD MWF -Dialysis in hospital today prior to discharge -Continue PhosLo  Diabetes  HTN Well-controlled. -Continue home hydralazine -Continue Lantus 10 units   FEN/GI: Renal, carb modified PPx: Subcu heparin Dispo:Home today. Barriers include imminent discharge PT eval.   Subjective:  Roger Vazquez feels well this morning.  He says that his pain is controlled on the oral medications.  He has not been able to ambulate yet and feels that he would be able to go home if he is able to ambulate with physical therapy.  Objective: Temp:  [97.8 F (36.6 C)-98.7 F (37.1 C)] 97.9 F (36.6 C) (07/22 0753) Pulse Rate:  [67-106] 86 (07/22 0753) Resp:  [9-18] 18 (07/22 0753) BP: (120-165)/(70-92) 151/85 (07/22 0753) SpO2:   [97 %-100 %] 100 % (07/22 0753) Physical Exam: General: Well-appearing older gentleman resting comfortably in bed Cardiovascular: Regular rate, regular rhythm, transmitted fistula sounds Respiratory: Lungs clear to auscultation bilaterally, normal work of breathing on room air Abdomen: Soft, nondistended, nontender Extremities: Left lower extremity amputation site with sutures, no bleeding, no surrounding erythema or drainage.  1+ distal pulses.  Laboratory: Recent Labs  Lab 05/04/21 0234 05/05/21 0201 05/05/21 0830  WBC 7.6 11.3* 13.6*  HGB 10.7* 10.3* 11.6*  HCT 31.0* 30.7* 35.6*  PLT 247 271 274   Recent Labs  Lab 05/04/21 0234 05/05/21 0201 05/05/21 0830  NA 132* 131* 131*  K 4.9 5.1 5.0  CL 94* 93* 91*  CO2 28 27 22   BUN 25* 45* 49*  CREATININE 5.11* 7.02* 7.50*  CALCIUM 9.0 9.2 9.6  GLUCOSE 133* 154* 170*     Imaging/Diagnostic Tests: No new imaging, tests  Eppie Gibson, MD 05/05/2021, 9:22 AM PGY-1, [facility] Intern pager: [phone], text pages welcome

## 2021-05-05 NOTE — Progress Notes (Addendum)
FPTS Brief Progress Note  S: Pt laying in bed watching tv this evening. Reports pain post operatively is 7/10. He would like something for the pain and to help him sleep.    O: BP 137/75   Pulse 98   Temp 98.7 F (37.1 C) (Oral)   Resp 17   Ht 5\' 6"  (1.676 m)   Wt 64.1 kg   SpO2 100%   BMI 22.81 kg/m    General: Comfortable, alert Extremities: Left lower extremity in Ace wrap, dressing dry and clean, no peripheral edema  A/P: POD #1 left second toe amputation -Change to Dilaudid 1 mg every 3 hours x 3 doses -Reassess pain in the morning, may need oxycodone for breakthrough pain -Melatonin for sleep - Orders reviewed. Labs for AM , which was adjusted as needed.   Lattie Haw, MD 05/05/2021, 1:30 AM PGY-3, [facility] Night Resident  Please page [phone] with questions.

## 2021-05-05 NOTE — Progress Notes (Addendum)
  [facility] Progress Note   Subjective:   Seen in room - s/p L 2nd toe amputation yesterday and did fine. No CP or dyspnea today. Plan is for HD later today.  Objective Vitals:   05/04/21 2111 05/05/21 0300 05/05/21 0551 05/05/21 0753  BP: 137/75 (!) 165/82 (!) 162/85 (!) 151/85  Pulse:  85  86  Resp:  18  18  Temp:  97.9 F (36.6 C)  97.9 F (36.6 C)  TempSrc:  Oral  Oral  SpO2:  99%  100%  Weight:      Height:       Physical Exam General: Well appearing man - NAD. Room air. Heart: RRR; no murmur Lungs: CTA anteriorly Abdomen: distended, non-tender Extremities: No LE edema; L foot bandaged Dialysis Access: R AVG + bruit   Additional Objective Labs: Basic Metabolic Panel: Recent Labs  Lab 05/04/21 0234 05/05/21 0201 05/05/21 0830  NA 132* 131* 131*  K 4.9 5.1 5.0  CL 94* 93* 91*  CO2 28 27 22   GLUCOSE 133* 154* 170*  BUN 25* 45* 49*  CREATININE 5.11* 7.02* 7.50*  CALCIUM 9.0 9.2 9.6  PHOS  --   --  3.4   Liver Function Tests: Recent Labs  Lab 05/05/21 0830  ALBUMIN 3.5   CBC: Recent Labs  Lab 04/30/21 0109 05/01/21 0205 05/04/21 0234 05/05/21 0201 05/05/21 0830  WBC 6.5 7.1 7.6 11.3* 13.6*  HGB 10.4* 10.7* 10.7* 10.3* 11.6*  HCT 31.2* 31.4* 31.0* 30.7* 35.6*  MCV 104.7* 103.3* 102.6* 102.7* 106.0*  PLT 260 254 247 271 274   Medications:  sodium chloride      acetaminophen  500 mg Oral Q6H   aspirin EC  81 mg Oral Daily   atorvastatin  40 mg Oral Daily   calcium acetate  667 mg Oral TID WC   clopidogrel  75 mg Oral Daily   doxercalciferol  2 mcg Intravenous Q M,W,F-HD   feeding supplement (NEPRO CARB STEADY)  237 mL Oral BID BM   heparin  5,000 Units Subcutaneous Q8H   hydrALAZINE  50 mg Oral Q8H   HYDROmorphone  1 mg Oral Q3H   insulin aspart  0-6 Units Subcutaneous TID WC   insulin glargine  10 Units Subcutaneous Daily   melatonin  3 mg Oral QHS   polyethylene glycol  17 g Oral BID   senna  1 tablet Oral Daily     Dialysis Orders: MWF at [facility] 3hr, EDW 60.5kg, 2K/3Ca, AVG, heparin 4000 - Hectoral 36mcg IV q HD - Mircera 71mcg IV q 2 weeks - last 7/13   Assessment/Plan: 1. L 2nd toe gangrene: VVS consulted, s/p PTA to L popliteal and L PT arteries on 7/15 by Dr. Stanford Breed. Pain improved. For L 2nd toe amputation today. S/p course of PO Keflex. 2. ESRD: Continue HD per usual MWF schedule -> HD later today. 3. HTN/volume: BP variable, at EDW. Challenge as tolerated. 4. Anemia: Hgb 11.6, stable without ESA for now. 5. Secondary hyperparathyroidism: Ca/Phos ok. Continue home binders + VDRA. 6. Nutrition:  Alb low, continue supplements. 7. T2DM 8. Hep C +  Veneta Penton, PA-C 05/05/2021, 11:37 AM  [facility]  I have seen and examined this patient and agree with plan and assessment in the above note with renal recommendations/intervention highlighted.  Broadus John A Shaughnessy Gethers,MD 05/05/2021 2:18 PM

## 2021-05-05 NOTE — Discharge Summary (Addendum)
[facility] Discharge Summary  Patient name: Roger Vazquez Medical record number: [id number] Date of birth: 10/04/1951 Age: 70 y.o. Gender: male Date of Admission: 04/27/2021  Date of Discharge: 05/05/2021 Admitting Physician: Leeanne Rio, MD  Primary Care Provider: [facility] Consultants: Vascular Surgery, Nephrology  Indication for Hospitalization: Gangrene of left second toe  Discharge Diagnoses/Problem List:  Active Problems:   Gangrene ([place])   Cellulitis of left lower extremity   Disposition: Home  Discharge Condition: Stable   Discharge Exam:  General: Well-appearing older gentleman resting comfortably in bed Cardiovascular: Regular rate, regular rhythm, transmitted fistula sounds Respiratory: Lungs clear to auscultation bilaterally, normal work of breathing on room air Abdomen: Soft, nondistended, nontender Extremities: Left lower extremity amputation site with sutures, no bleeding, no surrounding erythema or drainage.  1+ distal pulses.    Brief Hospital Course:  Roger Vazquez is a 70 year old male admitted for gangrene of his left 2nd toe and associated LLE cellulitis. PMHx includes ESRD on HD MWF, T2DM, HTN, lung cancer, Hep C, and MRSA bacteremia.   Gangrene of left 2nd toe Roger Vazquez presented to the [facility] ED on narrow his 7/14 with pain and discoloration of his left second toe.  On exam the toe was significantly necrotic and the foot had imperceivable peripheral pulses.  He was also noted to have an ulcer of the left great toe.  He was started on vancomycin and cefepime due to cellulitis with concern for underlying osteomyelitis.  Foot x-ray showed no specific radiographic findings of osteomyelitis.  We were able to narrow his antibiotics to Keflex.  Blood cultures were negative. Vascular surgery was consulted and felt that he would be a candidate for left lower extremity arteriogram with possible angioplasty.  On  7/15 vascular surgery took him to the OR for this procedure and ended up performing angioplasty of multiple vessels in the leg.  He had improvement in his peripheral pulses after this procedure but the gangrene of his second toe was unchanged and vascular surgery felt that he would need amputation.  The patient desired to have this done prior to discharge from this hospitalization.  He had his operation on 7/21 and had good postoperative pain control with oral narcotics.  He was discharged on 7/22 after being ambulating in hospital room with physical therapist.  Discharge recommendations: Patient is quite hard of hearing and it does not appear that he has ever been referred to audiology for this.  Consider referral to audiology. F/u vascular surgery in 3-4 weeks for suture removal and LLE arterial duplex and ABI.  Our nephrology team recommended a renal-appropriate feeding supplement. We gave him Nepro Carb-Steady shakes in hospital, consider continuing these on an outpatient basis.  Given his peripheral vascular disease, he is recommended to be on a statin. However, given that his LDL is 28, we elected not to start this in the hospital. PCP may engage in shared decision making with patient regarding whether to start a statin.  Due to his angioplasty, we are sending him home with ASA and Plavix. He will follow up with vascular surgery as an outpatient and expect that they will make recommendations regarding duration of therapy.   Significant Procedures:  7/15- Arteriogram of LLE with multi-vessel angioplasty 7/21- L second toe amputation  Significant Labs and Imaging:  Recent Labs  Lab 05/04/21 0234 05/05/21 0201 05/05/21 0830  WBC 7.6 11.3* 13.6*  HGB 10.7* 10.3* 11.6*  HCT 31.0* 30.7* 35.6*  PLT 247 271 274   Recent Labs  Lab 04/30/21 0827 05/01/21 0205 05/04/21 0234 05/05/21 0201 05/05/21 0830  NA 133* 133* 132* 131* 131*  K 4.7 4.0 4.9 5.1 5.0  CL 95* 94* 94* 93* 91*  CO2 _0 GLUCOSE 196* 174* 133* 154* 170*  BUN 36* 50* 25* 45* 49*  CREATININE 7.79* 9.41* 5.11* 7.02* 7.50*  CALCIUM 9.0 8.9 9.0 9.2 9.6  PHOS  --   --   --   --  3.4  ALBUMIN  --   --   --   --  3.5    Results/Tests Pending at Time of Discharge: Surgical pathology of L second toe   Discharge Medications:  Allergies as of 05/05/2021       Reactions   Sildenafil Other (See Comments)   Pt stated, "Makes my Blood Pressure drop"        Medication List     STOP taking these medications    blood glucose meter kit and supplies Kit   Insulin Syringe-Needle U-100 25G X 1" 1 ML Misc       TAKE these medications    acetaminophen 500 MG tablet Commonly known as: TYLENOL Take 1 tablet (500 mg total) by mouth every 6 (six) hours.   aspirin 81 MG tablet Take 81 mg by mouth daily.   calcium acetate 667 MG capsule Commonly known as: PHOSLO Take 667 mg by mouth 3 (three) times daily with meals.   calcium carbonate 1250 (500 Ca) MG chewable tablet Commonly known as: OS-CAL Chew 1 tablet by mouth daily as needed for heartburn.   clopidogrel 75 MG tablet Commonly known as: PLAVIX Take 1 tablet (75 mg total) by mouth daily.   hydrALAZINE 25 MG tablet Commonly known as: APRESOLINE Take 2 tablets (50 mg total) by mouth every 8 (eight) hours.   insulin aspart 100 UNIT/ML injection Commonly known as: NovoLOG Before each meal 3 times a day, 140-199 - 2 units, 200-250 - 4 units, 251-299 - 6 units,  300-349 - 8 units,  350 or above 10 units. Dispense syringes and needles as needed, Ok to switch to PEN if approved. Substitute to any brand approved. DX DM2, Code E11.65 What changed:  how much to take how to take this when to take this additional instructions   insulin glargine 100 UNIT/ML injection Commonly known as: Lantus Inject 0.15 mLs (15 Units total) into the skin daily. Dispense insulin pen if approved, if not dispense as needed syringes and needles for 1 month supply.  Can switch to Levemir. Diagnosis E 11.65.   multivitamin with minerals Tabs tablet Take 1 tablet by mouth daily.   oxyCODONE-acetaminophen 5-325 MG tablet Commonly known as: PERCOCET/ROXICET Take 1-2 tablets by mouth every 4 (four) hours as needed for up to 18 doses for moderate pain.   polyethylene glycol 17 g packet Commonly known as: MIRALAX / GLYCOLAX Take 17 g by mouth 2 (two) times daily.   senna 8.6 MG Tabs tablet Commonly known as: SENOKOT Take 1 tablet (8.6 mg total) by mouth daily.        Discharge Instructions: Please refer to Patient Instructions section of EMR for full details.  Patient was counseled important signs and symptoms that should prompt return to medical care, changes in medications, dietary instructions, activity restrictions, and follow up appointments.   Follow-Up Appointments:  Follow-up Information     Dagoberto Ligas, PA-C. Schedule an appointment as soon as possible for  a visit in 3 week(s).   Specialty: Physician Assistant Why: You need to call the vascular surgery clinic to make a follow up appointment for 3-4 weeks after hospital discharge. At this time, you will have suture removal and LLE arterial duplex and ABI. Contact information: [address] [place] [address] [facility] [place] Schedule an appointment as soon as possible for a visit in 1 week(s).   Why: Make a hospital follow up appointment with your primary care clinic for about 1 week after discharge. Contact information: [place] [address] [phone]                 Eppie Gibson, MD 05/05/2021, 7:02 PM PGY-1, [facility]  Upper Level Addendum:  I agree with documentation of Dr. Joelyn Oms above and I reviewed the above note making necessary revisions as appropriate.  Lurline Del, DO PGY-3 [facility] Family Medicine Residency

## 2021-05-06 ENCOUNTER — Telehealth: Payer: Self-pay | Admitting: Nephrology

## 2021-05-06 NOTE — Telephone Encounter (Signed)
Transition of care contact from inpatient facility  Date of discharge: 05/05/21 Date of contact: 05/06/21 Method: Phone Spoke to: Patient  Patient contacted to discuss transition of care from recent inpatient hospitalization. Patient was admitted to [facility] from 7/14-7/22/22 with discharge diagnosis of left second toe gangrene.   Medication changes were reviewed. No questions.   Patient will follow up with his/her outpatient HD unit on: Monday 7/25.

## 2021-05-16 ENCOUNTER — Other Ambulatory Visit: Payer: Self-pay

## 2021-05-16 DIAGNOSIS — C3412 Malignant neoplasm of upper lobe, left bronchus or lung: Secondary | ICD-10-CM | POA: Insufficient documentation

## 2021-05-16 DIAGNOSIS — C3490 Malignant neoplasm of unspecified part of unspecified bronchus or lung: Secondary | ICD-10-CM | POA: Insufficient documentation

## 2021-05-16 DIAGNOSIS — H4312 Vitreous hemorrhage, left eye: Secondary | ICD-10-CM | POA: Insufficient documentation

## 2021-05-16 DIAGNOSIS — D509 Iron deficiency anemia, unspecified: Secondary | ICD-10-CM | POA: Insufficient documentation

## 2021-05-16 DIAGNOSIS — F1010 Alcohol abuse, uncomplicated: Secondary | ICD-10-CM | POA: Insufficient documentation

## 2021-05-16 DIAGNOSIS — N1330 Unspecified hydronephrosis: Secondary | ICD-10-CM | POA: Insufficient documentation

## 2021-05-16 DIAGNOSIS — Z9289 Personal history of other medical treatment: Secondary | ICD-10-CM | POA: Insufficient documentation

## 2021-05-16 DIAGNOSIS — F17201 Nicotine dependence, unspecified, in remission: Secondary | ICD-10-CM | POA: Insufficient documentation

## 2021-05-16 DIAGNOSIS — E11319 Type 2 diabetes mellitus with unspecified diabetic retinopathy without macular edema: Secondary | ICD-10-CM | POA: Insufficient documentation

## 2021-05-16 DIAGNOSIS — Z9114 Patient's other noncompliance with medication regimen: Secondary | ICD-10-CM | POA: Insufficient documentation

## 2021-05-16 DIAGNOSIS — I96 Gangrene, not elsewhere classified: Secondary | ICD-10-CM

## 2021-05-16 DIAGNOSIS — B1710 Acute hepatitis C without hepatic coma: Secondary | ICD-10-CM | POA: Insufficient documentation

## 2021-05-16 DIAGNOSIS — N529 Male erectile dysfunction, unspecified: Secondary | ICD-10-CM | POA: Insufficient documentation

## 2021-05-18 ENCOUNTER — Encounter (HOSPITAL_COMMUNITY): Payer: Self-pay

## 2021-05-18 ENCOUNTER — Inpatient Hospital Stay (HOSPITAL_COMMUNITY)
Admission: EM | Admit: 2021-05-18 | Discharge: 2021-06-01 | DRG: 853 | Disposition: A | Discharge status: Skilled Nursing Facility | Payer: No Typology Code available for payment source | Attending: Family Medicine | Admitting: Family Medicine

## 2021-05-18 ENCOUNTER — Emergency Department (HOSPITAL_COMMUNITY): Payer: No Typology Code available for payment source

## 2021-05-18 DIAGNOSIS — F1010 Alcohol abuse, uncomplicated: Secondary | ICD-10-CM | POA: Diagnosis present

## 2021-05-18 DIAGNOSIS — Z7982 Long term (current) use of aspirin: Secondary | ICD-10-CM

## 2021-05-18 DIAGNOSIS — R0602 Shortness of breath: Secondary | ICD-10-CM

## 2021-05-18 DIAGNOSIS — I120 Hypertensive chronic kidney disease with stage 5 chronic kidney disease or end stage renal disease: Secondary | ICD-10-CM | POA: Diagnosis present

## 2021-05-18 DIAGNOSIS — E441 Mild protein-calorie malnutrition: Secondary | ICD-10-CM | POA: Diagnosis present

## 2021-05-18 DIAGNOSIS — E1152 Type 2 diabetes mellitus with diabetic peripheral angiopathy with gangrene: Secondary | ICD-10-CM | POA: Diagnosis present

## 2021-05-18 DIAGNOSIS — Z89439 Acquired absence of unspecified foot: Secondary | ICD-10-CM

## 2021-05-18 DIAGNOSIS — R4182 Altered mental status, unspecified: Secondary | ICD-10-CM | POA: Diagnosis not present

## 2021-05-18 DIAGNOSIS — Z87891 Personal history of nicotine dependence: Secondary | ICD-10-CM

## 2021-05-18 DIAGNOSIS — A419 Sepsis, unspecified organism: Principal | ICD-10-CM | POA: Diagnosis present

## 2021-05-18 DIAGNOSIS — E1122 Type 2 diabetes mellitus with diabetic chronic kidney disease: Secondary | ICD-10-CM | POA: Diagnosis present

## 2021-05-18 DIAGNOSIS — Z794 Long term (current) use of insulin: Secondary | ICD-10-CM

## 2021-05-18 DIAGNOSIS — I4891 Unspecified atrial fibrillation: Secondary | ICD-10-CM | POA: Diagnosis present

## 2021-05-18 DIAGNOSIS — N186 End stage renal disease: Secondary | ICD-10-CM | POA: Diagnosis present

## 2021-05-18 DIAGNOSIS — Z20822 Contact with and (suspected) exposure to covid-19: Secondary | ICD-10-CM | POA: Diagnosis present

## 2021-05-18 DIAGNOSIS — N2581 Secondary hyperparathyroidism of renal origin: Secondary | ICD-10-CM | POA: Diagnosis present

## 2021-05-18 DIAGNOSIS — Z79899 Other long term (current) drug therapy: Secondary | ICD-10-CM

## 2021-05-18 DIAGNOSIS — F0390 Unspecified dementia without behavioral disturbance: Secondary | ICD-10-CM | POA: Diagnosis present

## 2021-05-18 DIAGNOSIS — Z85118 Personal history of other malignant neoplasm of bronchus and lung: Secondary | ICD-10-CM

## 2021-05-18 DIAGNOSIS — E875 Hyperkalemia: Secondary | ICD-10-CM | POA: Diagnosis not present

## 2021-05-18 DIAGNOSIS — E11319 Type 2 diabetes mellitus with unspecified diabetic retinopathy without macular edema: Secondary | ICD-10-CM | POA: Diagnosis present

## 2021-05-18 DIAGNOSIS — Z9114 Patient's other noncompliance with medication regimen: Secondary | ICD-10-CM

## 2021-05-18 DIAGNOSIS — Z992 Dependence on renal dialysis: Secondary | ICD-10-CM

## 2021-05-18 DIAGNOSIS — E11649 Type 2 diabetes mellitus with hypoglycemia without coma: Secondary | ICD-10-CM | POA: Diagnosis present

## 2021-05-18 DIAGNOSIS — Z7902 Long term (current) use of antithrombotics/antiplatelets: Secondary | ICD-10-CM

## 2021-05-18 DIAGNOSIS — L089 Local infection of the skin and subcutaneous tissue, unspecified: Secondary | ICD-10-CM

## 2021-05-18 DIAGNOSIS — R339 Retention of urine, unspecified: Secondary | ICD-10-CM | POA: Diagnosis not present

## 2021-05-18 MED ORDER — LACTATED RINGERS IV BOLUS (SEPSIS)
1000.0000 mL | Freq: Once | INTRAVENOUS | Status: AC
  Administered 2021-05-18: 1000 mL via INTRAVENOUS

## 2021-05-18 MED ORDER — LACTATED RINGERS IV SOLN: INTRAVENOUS | Status: DC

## 2021-05-18 MED ORDER — VANCOMYCIN HCL IN DEXTROSE 750-5 MG/150ML-% IV SOLN
750.0000 mg | INTRAVENOUS | Status: DC
  Administered 2021-05-22: 750 mg via INTRAVENOUS
  Filled 2021-05-18 (×5): qty 150

## 2021-05-18 MED ORDER — SODIUM CHLORIDE 0.9 % IV SOLN
2.0000 g | Freq: Once | INTRAVENOUS | Status: AC
  Administered 2021-05-18: 2 g via INTRAVENOUS
  Filled 2021-05-18: qty 2

## 2021-05-18 MED ORDER — VANCOMYCIN HCL 1250 MG/250ML IV SOLN
1250.0000 mg | Freq: Once | INTRAVENOUS | Status: AC
  Administered 2021-05-19: 1250 mg via INTRAVENOUS
  Filled 2021-05-18: qty 250

## 2021-05-18 MED ORDER — SODIUM CHLORIDE 0.9 % IV SOLN
1.0000 g | INTRAVENOUS | Status: DC
  Administered 2021-05-19 – 2021-05-23 (×5): 1 g via INTRAVENOUS
  Filled 2021-05-18 (×10): qty 1

## 2021-05-18 MED ORDER — LACTATED RINGERS IV BOLUS (SEPSIS): 1000.0000 mL | Freq: Once | INTRAVENOUS | Status: DC

## 2021-05-18 MED ORDER — METRONIDAZOLE 500 MG/100ML IV SOLN
500.0000 mg | Freq: Once | INTRAVENOUS | Status: AC
  Administered 2021-05-19: 500 mg via INTRAVENOUS
  Filled 2021-05-18: qty 100

## 2021-05-18 NOTE — ED Triage Notes (Signed)
Pt BIBA for being found in bathtub, AMS with general weakness, s/p 2nd toe of left foot amputation. Pt a/ox4 for this RN. Pt c/o weakness but does not know why he was in bathtub. Also c/o blurry vision. Left foot appears more swollen, hot to touch with very difficult to palpate/absent pedal pulse. +cap refill.

## 2021-05-18 NOTE — Progress Notes (Signed)
Pharmacy Antibiotic Note  Roger Vazquez is a 70 y.o. male admitted on 05/18/2021 with sepsis.  Pharmacy has been consulted for vancomycin and cefepime dosing.  Plan: Vancomycin 1250mg  x1 then 750mg  MWF post HD Cefepime 2g x1 then 1g IV q24h --f/u HD sched and ensure gets HD tmrw and gets post HD dose --Monitor renal function, clinical status, and antibiotic plan  Height: 5\' 6"  (167.6 cm) Weight: 62 kg (136 lb 11 oz) IBW/kg (Calculated) : 63.8  Temp (24hrs), Avg:98.6 F (37 C), Min:98.6 F (37 C), Max:98.6 F (37 C)  No results for input(s): WBC, CREATININE, LATICACIDVEN, VANCOTROUGH, VANCOPEAK, VANCORANDOM, GENTTROUGH, GENTPEAK, GENTRANDOM, TOBRATROUGH, TOBRAPEAK, TOBRARND, AMIKACINPEAK, AMIKACINTROU, AMIKACIN in the last 168 hours.  Estimated Creatinine Clearance: 7.9 mL/min (A) (by C-G formula based on SCr of 7.5 mg/dL (H)).    Allergies  Allergen Reactions   Sildenafil Other (See Comments)    Pt stated, "Makes my Blood Pressure drop"    Antimicrobials this admission: Vanc 8/4 >>  Cefepime 8/4 >>   Dose adjustments this admission: N/A  Microbiology results: 8/4 BCx:  8/4 UCx:    Thank you for allowing pharmacy to be a part of this patient's care.  Joetta Manners, PharmD, [facility] Emergency Medicine Clinical Pharmacist ED RPh Phone: [place]: [phone]

## 2021-05-19 ENCOUNTER — Encounter (HOSPITAL_COMMUNITY): Payer: Self-pay | Admitting: *Deleted

## 2021-05-19 ENCOUNTER — Inpatient Hospital Stay (HOSPITAL_COMMUNITY): Payer: No Typology Code available for payment source

## 2021-05-19 ENCOUNTER — Encounter (HOSPITAL_COMMUNITY): Payer: No Typology Code available for payment source

## 2021-05-19 ENCOUNTER — Emergency Department (HOSPITAL_COMMUNITY): Payer: No Typology Code available for payment source

## 2021-05-19 DIAGNOSIS — Z9114 Patient's other noncompliance with medication regimen: Secondary | ICD-10-CM | POA: Diagnosis not present

## 2021-05-19 DIAGNOSIS — E11649 Type 2 diabetes mellitus with hypoglycemia without coma: Secondary | ICD-10-CM | POA: Diagnosis present

## 2021-05-19 DIAGNOSIS — I96 Gangrene, not elsewhere classified: Secondary | ICD-10-CM | POA: Diagnosis not present

## 2021-05-19 DIAGNOSIS — L089 Local infection of the skin and subcutaneous tissue, unspecified: Secondary | ICD-10-CM | POA: Diagnosis not present

## 2021-05-19 DIAGNOSIS — Z992 Dependence on renal dialysis: Secondary | ICD-10-CM | POA: Diagnosis not present

## 2021-05-19 DIAGNOSIS — E1122 Type 2 diabetes mellitus with diabetic chronic kidney disease: Secondary | ICD-10-CM | POA: Diagnosis present

## 2021-05-19 DIAGNOSIS — I120 Hypertensive chronic kidney disease with stage 5 chronic kidney disease or end stage renal disease: Secondary | ICD-10-CM | POA: Diagnosis present

## 2021-05-19 DIAGNOSIS — Z7982 Long term (current) use of aspirin: Secondary | ICD-10-CM | POA: Diagnosis not present

## 2021-05-19 DIAGNOSIS — A419 Sepsis, unspecified organism: Principal | ICD-10-CM

## 2021-05-19 DIAGNOSIS — Z794 Long term (current) use of insulin: Secondary | ICD-10-CM | POA: Diagnosis not present

## 2021-05-19 DIAGNOSIS — Z89439 Acquired absence of unspecified foot: Secondary | ICD-10-CM | POA: Diagnosis not present

## 2021-05-19 DIAGNOSIS — Z20822 Contact with and (suspected) exposure to covid-19: Secondary | ICD-10-CM | POA: Diagnosis present

## 2021-05-19 DIAGNOSIS — E11319 Type 2 diabetes mellitus with unspecified diabetic retinopathy without macular edema: Secondary | ICD-10-CM | POA: Diagnosis present

## 2021-05-19 DIAGNOSIS — F0390 Unspecified dementia without behavioral disturbance: Secondary | ICD-10-CM | POA: Diagnosis present

## 2021-05-19 DIAGNOSIS — F1010 Alcohol abuse, uncomplicated: Secondary | ICD-10-CM | POA: Diagnosis present

## 2021-05-19 DIAGNOSIS — R4182 Altered mental status, unspecified: Secondary | ICD-10-CM | POA: Diagnosis present

## 2021-05-19 DIAGNOSIS — Z7902 Long term (current) use of antithrombotics/antiplatelets: Secondary | ICD-10-CM | POA: Diagnosis not present

## 2021-05-19 DIAGNOSIS — Z79899 Other long term (current) drug therapy: Secondary | ICD-10-CM | POA: Diagnosis not present

## 2021-05-19 DIAGNOSIS — E1152 Type 2 diabetes mellitus with diabetic peripheral angiopathy with gangrene: Secondary | ICD-10-CM | POA: Diagnosis present

## 2021-05-19 DIAGNOSIS — E875 Hyperkalemia: Secondary | ICD-10-CM | POA: Diagnosis not present

## 2021-05-19 DIAGNOSIS — N2581 Secondary hyperparathyroidism of renal origin: Secondary | ICD-10-CM | POA: Diagnosis present

## 2021-05-19 DIAGNOSIS — N186 End stage renal disease: Secondary | ICD-10-CM | POA: Diagnosis present

## 2021-05-19 DIAGNOSIS — E441 Mild protein-calorie malnutrition: Secondary | ICD-10-CM | POA: Diagnosis present

## 2021-05-19 DIAGNOSIS — Z87891 Personal history of nicotine dependence: Secondary | ICD-10-CM | POA: Diagnosis not present

## 2021-05-19 DIAGNOSIS — R339 Retention of urine, unspecified: Secondary | ICD-10-CM | POA: Diagnosis not present

## 2021-05-19 DIAGNOSIS — I4891 Unspecified atrial fibrillation: Secondary | ICD-10-CM | POA: Diagnosis present

## 2021-05-19 DIAGNOSIS — Z85118 Personal history of other malignant neoplasm of bronchus and lung: Secondary | ICD-10-CM | POA: Diagnosis not present

## 2021-05-19 LAB — CBC
HCT: 28.5 % — ABNORMAL LOW (ref 39.0–52.0)
Hemoglobin: 9.7 g/dL — ABNORMAL LOW (ref 13.0–17.0)
MCH: 35.7 pg — ABNORMAL HIGH (ref 26.0–34.0)
MCHC: 34 g/dL (ref 30.0–36.0)
MCV: 104.8 fL — ABNORMAL HIGH (ref 80.0–100.0)
Platelets: 247 10*3/uL (ref 150–400)
RBC: 2.72 MIL/uL — ABNORMAL LOW (ref 4.22–5.81)
RDW: 15.1 % (ref 11.5–15.5)
WBC: 16.3 10*3/uL — ABNORMAL HIGH (ref 4.0–10.5)
nRBC: 0 % (ref 0.0–0.2)

## 2021-05-19 LAB — RENAL FUNCTION PANEL
Albumin: 3 g/dL — ABNORMAL LOW (ref 3.5–5.0)
Anion gap: 16 — ABNORMAL HIGH (ref 5–15)
BUN: 61 mg/dL — ABNORMAL HIGH (ref 8–23)
CO2: 22 mmol/L (ref 22–32)
Calcium: 9.1 mg/dL (ref 8.9–10.3)
Chloride: 100 mmol/L (ref 98–111)
Creatinine, Ser: 11.3 mg/dL — ABNORMAL HIGH (ref 0.61–1.24)
GFR, Estimated: 4 mL/min — ABNORMAL LOW
Glucose, Bld: 82 mg/dL (ref 70–99)
Phosphorus: 5 mg/dL — ABNORMAL HIGH (ref 2.5–4.6)
Potassium: 3.6 mmol/L (ref 3.5–5.1)
Sodium: 138 mmol/L (ref 135–145)

## 2021-05-19 LAB — COMPREHENSIVE METABOLIC PANEL
ALT: 15 U/L (ref 0–44)
AST: 42 U/L — ABNORMAL HIGH (ref 15–41)
Albumin: 3.5 g/dL (ref 3.5–5.0)
Alkaline Phosphatase: 86 U/L (ref 38–126)
Anion gap: 17 — ABNORMAL HIGH (ref 5–15)
BUN: 56 mg/dL — ABNORMAL HIGH (ref 8–23)
CO2: 22 mmol/L (ref 22–32)
Calcium: 9.2 mg/dL (ref 8.9–10.3)
Chloride: 96 mmol/L — ABNORMAL LOW (ref 98–111)
Creatinine, Ser: 10.69 mg/dL — ABNORMAL HIGH (ref 0.61–1.24)
GFR, Estimated: 5 mL/min — ABNORMAL LOW (ref >60)
Glucose, Bld: 161 mg/dL — ABNORMAL HIGH (ref 70–99)
Potassium: 4.1 mmol/L (ref 3.5–5.1)
Sodium: 135 mmol/L (ref 135–145)
Total Bilirubin: 1.1 mg/dL (ref 0.3–1.2)
Total Protein: 8.7 g/dL — ABNORMAL HIGH (ref 6.5–8.1)

## 2021-05-19 LAB — CBC WITH DIFFERENTIAL/PLATELET
Abs Immature Granulocytes: 0.22 10*3/uL — ABNORMAL HIGH (ref 0.00–0.07)
Basophils Absolute: 0.1 10*3/uL (ref 0.0–0.1)
Basophils Relative: 0 %
Eosinophils Absolute: 0 10*3/uL (ref 0.0–0.5)
Eosinophils Relative: 0 %
HCT: 32.3 % — ABNORMAL LOW (ref 39.0–52.0)
Hemoglobin: 11 g/dL — ABNORMAL LOW (ref 13.0–17.0)
Immature Granulocytes: 1 %
Lymphocytes Relative: 7 %
Lymphs Abs: 1.3 10*3/uL (ref 0.7–4.0)
MCH: 35.3 pg — ABNORMAL HIGH (ref 26.0–34.0)
MCHC: 34.1 g/dL (ref 30.0–36.0)
MCV: 103.5 fL — ABNORMAL HIGH (ref 80.0–100.0)
Monocytes Absolute: 1.2 10*3/uL — ABNORMAL HIGH (ref 0.1–1.0)
Monocytes Relative: 6 %
Neutro Abs: 16.2 10*3/uL — ABNORMAL HIGH (ref 1.7–7.7)
Neutrophils Relative %: 86 %
Platelets: 293 10*3/uL (ref 150–400)
RBC: 3.12 MIL/uL — ABNORMAL LOW (ref 4.22–5.81)
RDW: 15.1 % (ref 11.5–15.5)
WBC: 18.9 10*3/uL — ABNORMAL HIGH (ref 4.0–10.5)
nRBC: 0 % (ref 0.0–0.2)

## 2021-05-19 LAB — CK: Total CK: 430 U/L — ABNORMAL HIGH (ref 49–397)

## 2021-05-19 LAB — LACTIC ACID, PLASMA
Lactic Acid, Venous: 1.4 mmol/L (ref 0.5–1.9)
Lactic Acid, Venous: 0.9 mmol/L (ref 0.5–1.9)

## 2021-05-19 LAB — CBG MONITORING, ED
Glucose-Capillary: 147 mg/dL — ABNORMAL HIGH (ref 70–99)
Glucose-Capillary: 167 mg/dL — ABNORMAL HIGH (ref 70–99)
Glucose-Capillary: 92 mg/dL (ref 70–99)

## 2021-05-19 LAB — PROCALCITONIN
Procalcitonin: 4.41 ng/mL
Procalcitonin: 5.44 ng/mL

## 2021-05-19 LAB — RESP PANEL BY RT-PCR (FLU A&B, COVID) ARPGX2
Influenza A by PCR: NEGATIVE
Influenza B by PCR: NEGATIVE
SARS Coronavirus 2 by RT PCR: NEGATIVE

## 2021-05-19 LAB — ETHANOL: Alcohol, Ethyl (B): <10 mg/dL (ref <10)

## 2021-05-19 LAB — MAGNESIUM: Magnesium: 2 mg/dL (ref 1.7–2.4)

## 2021-05-19 LAB — GLUCOSE, CAPILLARY: Glucose-Capillary: 66 mg/dL — ABNORMAL LOW (ref 70–99)

## 2021-05-19 LAB — PROTIME-INR
INR: 1.1 (ref 0.8–1.2)
Prothrombin Time: 14.2 seconds (ref 11.4–15.2)

## 2021-05-19 LAB — AMMONIA: Ammonia: 17 umol/L (ref 9–35)

## 2021-05-19 LAB — APTT: aPTT: 34 seconds (ref 24–36)

## 2021-05-19 MED ORDER — POLYETHYLENE GLYCOL 3350 17 G PO PACK
17.0000 g | PACK | Freq: Two times a day (BID) | ORAL | Status: DC
  Administered 2021-05-19 – 2021-05-21 (×4): 17 g via ORAL
  Filled 2021-05-19 (×4): qty 1

## 2021-05-19 MED ORDER — SENNA 8.6 MG PO TABS
1.0000 | ORAL_TABLET | Freq: Every day | ORAL | Status: DC
  Administered 2021-05-19 – 2021-05-20 (×2): 8.6 mg via ORAL
  Filled 2021-05-19 (×2): qty 1

## 2021-05-19 MED ORDER — OXYCODONE HCL 5 MG PO TABS
5.0000 mg | ORAL_TABLET | Freq: Four times a day (QID) | ORAL | Status: DC | PRN
Start: 2021-05-19 — End: 2021-05-23
  Administered 2021-05-19 – 2021-05-22 (×8): 5 mg via ORAL
  Filled 2021-05-19 (×9): qty 1

## 2021-05-19 MED ORDER — ACETAMINOPHEN 500 MG PO TABS: 500.0000 mg | ORAL_TABLET | Freq: Four times a day (QID) | ORAL | Status: DC | PRN

## 2021-05-19 MED ORDER — HYDRALAZINE HCL 50 MG PO TABS
50.0000 mg | ORAL_TABLET | Freq: Three times a day (TID) | ORAL | Status: DC
  Administered 2021-05-19 – 2021-06-01 (×36): 50 mg via ORAL
  Filled 2021-05-19 (×9): qty 1
  Filled 2021-05-19 (×2): qty 2
  Filled 2021-05-19 (×25): qty 1

## 2021-05-19 MED ORDER — DARBEPOETIN ALFA 25 MCG/0.42ML IJ SOSY
25.0000 ug | PREFILLED_SYRINGE | Freq: Once | INTRAMUSCULAR | Status: AC
  Administered 2021-05-20: 25 ug via INTRAVENOUS
  Filled 2021-05-19: qty 0.42

## 2021-05-19 MED ORDER — ASPIRIN 81 MG PO CHEW
81.0000 mg | CHEWABLE_TABLET | Freq: Every day | ORAL | Status: DC
  Administered 2021-05-19 – 2021-06-01 (×13): 81 mg via ORAL
  Filled 2021-05-19 (×13): qty 1

## 2021-05-19 MED ORDER — ACETAMINOPHEN 500 MG PO TABS
500.0000 mg | ORAL_TABLET | Freq: Four times a day (QID) | ORAL | Status: DC
  Administered 2021-05-19 – 2021-05-31 (×44): 500 mg via ORAL
  Filled 2021-05-19 (×46): qty 1

## 2021-05-19 MED ORDER — RENA-VITE PO TABS
1.0000 | ORAL_TABLET | Freq: Every day | ORAL | Status: DC
  Administered 2021-05-19 – 2021-05-31 (×12): 1 via ORAL
  Filled 2021-05-19 (×13): qty 1

## 2021-05-19 MED ORDER — HEPARIN SODIUM (PORCINE) 5000 UNIT/ML IJ SOLN
5000.0000 [IU] | Freq: Three times a day (TID) | INTRAMUSCULAR | Status: DC
  Administered 2021-05-19 – 2021-05-30 (×33): 5000 [IU] via SUBCUTANEOUS
  Filled 2021-05-19 (×33): qty 1

## 2021-05-19 MED ORDER — VANCOMYCIN HCL IN DEXTROSE 750-5 MG/150ML-% IV SOLN
750.0000 mg | INTRAVENOUS | Status: DC
  Filled 2021-05-19: qty 150

## 2021-05-19 MED ORDER — CALCIUM ACETATE (PHOS BINDER) 667 MG PO CAPS
667.0000 mg | ORAL_CAPSULE | Freq: Three times a day (TID) | ORAL | Status: DC
  Administered 2021-05-19 – 2021-06-01 (×32): 667 mg via ORAL
  Filled 2021-05-19 (×32): qty 1

## 2021-05-19 MED ORDER — INSULIN GLARGINE-YFGN 100 UNIT/ML ~~LOC~~ SOLN
15.0000 [IU] | Freq: Every day | SUBCUTANEOUS | Status: DC
  Administered 2021-05-19: 15 [IU] via SUBCUTANEOUS
  Filled 2021-05-19 (×2): qty 0.15

## 2021-05-19 MED ORDER — CLOPIDOGREL BISULFATE 75 MG PO TABS
75.0000 mg | ORAL_TABLET | Freq: Every day | ORAL | Status: DC
  Administered 2021-05-19 – 2021-06-01 (×13): 75 mg via ORAL
  Filled 2021-05-19 (×14): qty 1

## 2021-05-19 NOTE — Hospital Course (Addendum)
Roger Vazquez is a 70 y.o. male with past medical history significant for ESRD on HD, history of lung cancer, PAD, hypertension, type 2 diabetes, polysubstance abuse who presented after being found down with concern for sepsis in the setting of altered mental status (AMS).   Sepsis on admission  Gangrene of L Foot Patient was brought in by EMS after being found down in his bathtub for unknown amount of time.  He was recently discharged for gangrene of his right second toe status post amputation.  He arrived tachycardic and hypertensive but afebrile.  White blood cell count elevated 18.9.  Chest x-ray negative, lactic acid unremarkable, CT head without acute intracranial abnormality.  MRI of the foot showed diffuse muscular edema with subcutaneous edema and potentially cellulitis.  Patient was on sepsis protocol and received cefepime, vancomycin, and Flagyl.  Vascular surgery was consulted and noted high risk for major amputation requiring transmetatarsal amputation.  On 8/8 he was taken for surgery with VVS and underwent successful transmetatarsal amputation with finding of necrosis of metatarsals.   Dementia  Delirium He had multiple episodes of disorientation/agitation on the floor that required PRN ativan and a 1-to-1 sitter in addition to delirium precautions. These were thought to be indicative of some "ICU delirium," though we also considered etOH withdrawal given his history of etOH abuse. These resolved by hospital day 4 and his mental status remained at baseline for the remainder of the hospitalization.   ESRD on HD Patient notably had missed HD session on Wednesday prior to arrival.  His dialysis regimen is MWF.  Nephrology was consulted and he continued dialysis beginning the day after admission (Saturday). He tolerated dialysis without issue.   Right Upper Extremity Edema  Patient noticed to have acute, painful swelling of RUE at dialysis session 8/11. Fistulagram on 8/12 showed a small  segmental occlusion of R brachiocephalic vein which was easily recanalized with balloon angioplasty. He had some residual edema but no further pain after the procedure.   DM2 He had a few episodes of hyperglycemia to the ***30s while on his home regiment of Lantus 15u. Due to his poor PO intake while in hospital, we held his long-acting insulin and instead placed him on sliding scale insulin. His glucose remained well-controlled for the remainder of the hospitalization on this sliding scale regimen.  PCP follow-up recommendations Outpatient follow-up with Vascular Surgery 8/23. It is possible that he may require a transtibial amputation if he does not heal his current wound. Monitor for signs of worsening gangrene.  He was evaluated by our SLP who had some concern that he was at risk for aspiration. They have recommended a dysphagia 3 diet. Recommend outpatient education on safe food preparation options. There was some concern on admission that he may have been drinking heavily between his last hospitalization and this one. Please screen him for alcohol abuse.

## 2021-05-19 NOTE — H&P (Addendum)
[facility] Admission History and Physical Service Pager: [phone]  Patient name: Roger Vazquez Medical record number: [id number] Date of birth: 1951-06-22 Age: 70 y.o. Gender: male  Primary Care Provider: [facility] Consultants: VVS, Nephro Code Status: Full  Preferred Emergency Contact: Macdougal,Linda (Sister)  [phone]   Chief Complaint: Altered mental status  Assessment and Plan: Roger Vazquez is a 70 y.o. male presenting with altered mental status. PMH is significant for ESRD on HD MWF, hx of lung CA, PAD, HTN, DM2, polysubstance abuse.  Sepsis  Altered Mental Status   Patient brought in by EMS after being found down in bathtub for unknown amount of time.  Patient oriented to self and place but very confused and cannot give a history of how he ended up in the bathtub.  He was recently discharged after hospitalization for gangrene of his right second toe which resulted in amputation by VVS.  On arrival tachycardic to 101, respirations 20, hypertensive to 180/99.  Patient afebrile reports chills.  Maintaining sats on room air.  White blood cell count elevated to 18.9.  Lactic acid within normal limits at 1.4.  Chest x-ray negative.  CT head with chronic microvascular ischemia and generalized atrophy without acute intracranial abnormality.  X-ray of the foot without evidence of osteo but with diffuse subcutaneous edema. On exam he is confused and appears euvolemic with dry mucus membranes.  The differential for his altered mental status is quite broad.  Most likely it is secondary to sepsis from his foot. No other sources readily identified with negative chest x-ray.  UA pending.  Consider also an element of uremia given that he missed dialysis on Wednesday.  Also consider EtOH intoxication/withdrawal given his history of polysubstance abuse.  His sister believes that he may have been drinking since his last discharge though patient denies  this at present. S/p vanc, cefepime, and 1L LR bolus in ED.  -Admit to progressive, Dr. Erin Hearing attending -We will consult vascular surgery -Continue vancomycin, cefepime -Oral hydration -Follow white count on CBC -Procalcitonin -UDS -UA -EtOH level -Ammonia level -CK -Trend CIWA's -Renal/carb modified diet  S/p Second Toe Amputation LLE  Ulcer of L Great Toe Surgical site with open wound, sutures remain in place.  Scant bloody drainage.  No purulence but marked surrounding edema and erythema.  Peripheral pulses barely perceptible.  Per chart review vascular had been planning for outpatient ABIs. -Consult vascular as above -Tylenol for pain control -Neurovascular checks of extremity -We will obtain ABIs -MRI Lt Foot  ESRD on HD Patient attends dialysis on Monday Wednesday Friday.  Did not attend session on Wednesday, unclear why not. -Consult to nephrology -Avoid nephrotoxic agents -We will require hospital dialysis -Continue PhosLo and renal vitamin  T2DM A1c 7.9% on 7/14.  Glucose today 161.  Home regimen includes 10 units LAI and sliding scale insulin. -Continue 10 units Lantus -Monitor CBGs  HTN  PAD  Hypertensive to 190s/90s today.  Home antihypertensive regimen is hydralazine 50 mg every 8 hours.  Unclear when he last took his home medications. -Continue ASA, Plavix -Continue home hydralazine  Chronic Constipation Patient with recurrent constipation.  Unclear when last bowel movement was.  Abdomen distended on exam. -Continue home MiraLAX and senna  Prolonged QTc Qtc 584 today.  -Avoid QT prolonging meds -Repeat EKG   FEN/GI: Renal/carb modified with 2000 mL fluid restriction Prophylaxis: Heparin  Disposition: To be determined  History of Present Illness:  Roger Vazquez is a 70  y.o. male presenting with altered mental status and sepsis after being found down in his bathtub after an uncertain period of time.  He is unable to provide history and  history obtained from his sister [name]. He was recently discharged from our service after an admission for gangrene of his left second toe.  This was amputated by VVS.  Per his sister, he had been doing overall well at home but was perhaps a bit more confused than usual starting on Sunday.  She has not seen him since then.  According to his roommate he did not go to dialysis on Wednesday and has not been eating for several days.  His roommate notes that he was in his bathroom for an unknown amount of time, up to 24 hours.  His roommate knocked on the door to ask if he was okay to which Mr. Googe responded "I am okay, man."  Due to the duration that he was in the bathroom, his roommate called his sisters who called EMS to pick him up and bring him to the hospital.  It is unclear whether he fell or simply sat down in the shower and did not get up. He is unable to provide any further history, responding to every question with "I do not know, man."  His main complaint right now is "I am cold and I want to eat."  He endorses chills.  Is having pain in his left foot post surgery.  He denies any chest pain, headache.   Review Of Systems: Per HPI with the following additions:   Review of Systems  Constitutional:  Positive for activity change, appetite change and chills.  Respiratory:  Positive for shortness of breath. Negative for cough and chest tightness.   Cardiovascular:  Negative for chest pain and palpitations.  Genitourinary:  Negative for difficulty urinating and flank pain.  Neurological:  Positive for weakness. Negative for dizziness and headaches.  Psychiatric/Behavioral:  Positive for confusion. Negative for agitation.     Patient Active Problem List   Diagnosis Date Noted   Altered mental status 05/19/2021   Alcohol abuse 05/16/2021   Erectile dysfunction 05/16/2021   Hydronephrosis 05/16/2021   Acute hepatitis C 05/16/2021   Diabetic retinopathy ([place]) 05/16/2021   History of  hemodialysis 05/16/2021   Iron deficiency anemia 05/16/2021   Malignant neoplasm of upper lobe, left bronchus or lung ([place]) 05/16/2021   Noncompliance with medication regimen 05/16/2021   Non-small cell lung cancer ([place]) 05/16/2021   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   Vitreous hemorrhage, left eye ([place]) 05/16/2021   Cellulitis of left lower extremity    Gangrene ([place]) 04/27/2021   Diarrhea, unspecified 04/29/2019   Mild protein-calorie malnutrition ([place]) 08/20/2018   Fever    Infection of AV graft for dialysis ([place]) 08/05/2018   Symptomatic anemia 08/05/2018   Hypokalemia 08/05/2018   GERD (gastroesophageal reflux disease) 08/05/2018   Sepsis due to unspecified Staphylococcus ([place]) 08/02/2018   ESRD (end stage renal disease) ([place])    Bacteremia due to methicillin susceptible Staphylococcus aureus (MSSA) 07/28/2018   Sepsis ([place]) 07/27/2018   Headache 01/14/2017   Fluid overload, unspecified 12/13/2015   Anemia in chronic kidney disease 12/09/2015   Chronic kidney disease, stage 5 ([place]) 12/09/2015   Coagulation defect, unspecified ([place]) 12/09/2015   Encounter for adjustment and management of vascular access device 12/09/2015   Other secondary pulmonary hypertension ([place]) 12/09/2015   Pruritus, unspecified 12/09/2015   Secondary hyperparathyroidism of renal origin ([place]) 12/09/2015   Shortness of  breath 12/09/2015   Abdominal pain, other specified site 10/07/2011   Nausea and vomiting 10/07/2011   VITAMIN D DEFICIENCY 03/19/2008   FRACTURE, TIBIA 02/09/2008   HTN (hypertension) 11/25/2007   HEPATITIS C 05/27/2007   Type 2 diabetes mellitus with chronic kidney disease on chronic dialysis ([place]) 05/27/2007   ERECTILE DYSFUNCTION 05/27/2007   ABUSE, ALCOHOL, IN REMISSION 05/27/2007   ABUSE, OPIOID, IN REMISSION 05/27/2007   ABUSE, COCAINE, IN REMISSION 05/27/2007    Past Medical History: Past Medical History:  Diagnosis Date   Cancer ([place])    Lung   Diabetes  mellitus    Hypertension    Renal disorder    MWF- [facility]    Past Surgical History: Past Surgical History:  Procedure Laterality Date   ABDOMINAL AORTOGRAM W/LOWER EXTREMITY Left 04/28/2021   Procedure: ABDOMINAL AORTOGRAM W/LOWER EXTREMITY;  Surgeon: Cherre Robins, MD;  Location: [facility];  Service: Cardiovascular;  Laterality: Left;   AMPUTATION Left 05/04/2021   Procedure: LEFT SECOND TOE AMPUTATION;  Surgeon: Cherre Robins, MD;  Location: [facility];  Service: Vascular;  Laterality: Left;   artiovenous graft placement Left    AV FISTULA PLACEMENT Right 11/27/2018   Procedure: INSERTION 4-7MM X 45CM GORETEX GRAFT RIGHT FOREARM ;  Surgeon: Rosetta Posner, MD;  Location: [facility];  Service: Vascular;  Laterality: Right;   [place] REMOVAL Left 08/06/2018   Procedure: REMOVAL OF LEFT ARM GRAFT;  Surgeon: Rosetta Posner, MD;  Location: [facility];  Service: Vascular;  Laterality: Left;   INSERTION OF DIALYSIS CATHETER Right 08/08/2018   Procedure: INSERTION OF TUNNELED  DIALYSIS CATHETER;  Surgeon: Marty Heck, MD;  Location: [facility];  Service: Vascular;  Laterality: Right;   IR REMOVAL TUN CV CATH W/O FL  02/19/2019   LUNG SURGERY     patient not if they removed part of lung. Was done at [facility] Left 04/28/2021   Procedure: PERIPHERAL VASCULAR BALLOON ANGIOPLASTY;  Surgeon: Cherre Robins, MD;  Location: [facility];  Service: Cardiovascular;  Laterality: Left;  popliteal and posterial tibial   TEE WITHOUT CARDIOVERSION N/A 07/31/2018   Procedure: TRANSESOPHAGEAL ECHOCARDIOGRAM (TEE);  Surgeon: Pixie Casino, MD;  Location: [facility];  Service: Cardiovascular;  Laterality: N/A;    Social History: Social History   Tobacco Use   Smoking status: Former    Packs/day: 0.50    Years: 10.00    Pack years: 5.00    Types: Cigarettes    Quit date: 2001    Years since quitting: 21.6   Smokeless tobacco: Former    Quit date:  08/01/2015  Vaping Use   Vaping Use: Never used  Substance Use Topics   Alcohol use: Yes    Alcohol/week: 2.0 standard drinks    Types: 2 Cans of beer per week   Drug use: Yes    Types: Marijuana    Comment: last time 11/19/2018     Family History: History reviewed. No pertinent family history. Unable to review family history due to confusion.  Allergies and Medications: Allergies  Allergen Reactions   Sildenafil Other (See Comments)    Pt stated, "Makes my Blood Pressure drop"   No current facility-administered medications on file prior to encounter.   Current Outpatient Medications on File Prior to Encounter  Medication Sig Dispense Refill   acetaminophen (TYLENOL) 500 MG tablet Take 1 tablet (500 mg total) by mouth every 6 (six) hours. (Patient taking  differently: Take 500 mg by mouth every 6 (six) hours as needed for mild pain or headache.) 30 tablet 0   aspirin 81 MG tablet Take 81 mg by mouth daily.       calcium acetate (PHOSLO) 667 MG capsule Take 667 mg by mouth 3 (three) times daily with meals.     calcium carbonate (OS-CAL) 1250 (500 Ca) MG chewable tablet Chew 1 tablet by mouth daily as needed for heartburn.     clopidogrel (PLAVIX) 75 MG tablet Take 1 tablet (75 mg total) by mouth daily. 30 tablet 0   hydrALAZINE (APRESOLINE) 25 MG tablet Take 2 tablets (50 mg total) by mouth every 8 (eight) hours. (Patient not taking: Reported on 11/20/2018) 90 tablet 3   insulin aspart (NOVOLOG) 100 UNIT/ML injection Before each meal 3 times a day, 140-199 - 2 units, 200-250 - 4 units, 251-299 - 6 units,  300-349 - 8 units,  350 or above 10 units. Dispense syringes and needles as needed, Ok to switch to PEN if approved. Substitute to any brand approved. DX DM2, Code E11.65 1 vial 0   insulin glargine (LANTUS) 100 UNIT/ML injection Inject 0.15 mLs (15 Units total) into the skin daily. Dispense insulin pen if approved, if not dispense as needed syringes and needles for 1 month supply. Can  switch to Levemir. Diagnosis E 11.65. 10 mL 0   Multiple Vitamin (MULITIVITAMIN WITH MINERALS) TABS Take 1 tablet by mouth daily. 30 tablet 0   oxyCODONE-acetaminophen (PERCOCET/ROXICET) 5-325 MG tablet Take 1-2 tablets by mouth every 4 (four) hours as needed for up to 18 doses for moderate pain. 30 tablet 0   polyethylene glycol (MIRALAX / GLYCOLAX) 17 g packet Take 17 g by mouth 2 (two) times daily. 14 each 0   senna (SENOKOT) 8.6 MG TABS tablet Take 1 tablet (8.6 mg total) by mouth daily. 7 tablet 0    Objective: BP (!) 187/91   Pulse 85   Temp 98.6 F (37 C) (Oral)   Resp 15   Ht 5\' 6"  (1.676 m)   Wt 62 kg   SpO2 97%   BMI 22.06 kg/m  Exam: General: Uncomfortable appearing older gentleman Eyes: Sclerae anicteric, no conjunctival injection ENTM: No congestion, mucous membranes dry Neck: Supple, no thyromegaly Cardiovascular: Irregular, 2 out of 6 systolic murmur Respiratory: Lungs clear to auscultation anteriorly, normal work of breathing on room air Gastrointestinal: Distended, nontender, bowel sounds present MSK: Left lower extremity with open wound status post amputation of second toe, scant bloody drainage.  Barely perceptible peripheral pulses on left lower extremity. Derm: Without rash or excoriation Neuro: Alert, disoriented.  Moving all extremities independently.  Neuro exam limited by mental status Psych: Confused but answers most questions appropriately  Labs and Imaging: CBC BMET  Recent Labs  Lab 05/18/21 2306  WBC 18.9*  HGB 11.0*  HCT 32.3*  PLT 293   Recent Labs  Lab 05/18/21 2306  NA 135  K 4.1  CL 96*  CO2 22  BUN 56*  CREATININE 10.69*  GLUCOSE 161*  CALCIUM 9.2     EKG: Sinus rhythm with brief sinus pause, QTC 584  CT Head IMPRESSION: Chronic microvascular ischemia and generalized atrophy without acute intracranial abnormality.  DG Foot Complete Left IMPRESSION: 1. No acute fracture or dislocation. Amputation of the distal  second metatarsal. 2. Diffuse subcutaneous edema.  CXR IMPRESSION: 1. Stable chest, no acute process.  Eppie Gibson, MD 05/19/2021, 5:35 AM PGY-1, [place]  Intern pager: [phone], text pages welcome   FPTS Upper-Level Resident Addendum   I have independently interviewed and examined the patient. I have discussed the above with the original author and agree with their documentation. Please see also any attending notes.    Carollee Leitz MD PGY-3, [facility] 05/19/2021 8:04 AM  FPTS Service pager: [phone] (text pages welcome through [facility])

## 2021-05-19 NOTE — ED Notes (Signed)
Back from CT

## 2021-05-19 NOTE — ED Notes (Signed)
Pt accidentally pulls out IV from right hand. Pt also saturated with urine s/p condom cath bag becoming dislodged. Pt cleaned, sheets changed, placed on male [place]

## 2021-05-19 NOTE — ED Notes (Signed)
Patient transported to CT 

## 2021-05-19 NOTE — ED Notes (Signed)
Report given to Webb Laws, RN

## 2021-05-19 NOTE — Consult Note (Addendum)
[facility] [place]  ASSESSMENT / PLAN: 70 y.o. 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  He is maximally revascularized.  He is at high risk for major amputation.  He will need transmetatarsal amputation. Will plan to do this Monday 05/22/21.  CHIEF COMPLAINT: Worsening gangrene of left foot  HISTORY OF PRESENT ILLNESS: Roger Vazquez is a 70 y.o. male admitted to the medicine service for altered mental status.  Patient is well-known to me.  I did a left popliteal and posterior tibial angioplasty for him 04/28/2021.  This was followed by second toe amputation 05/04/2021.  This is unfortunately not healed.  He has frank dehiscence of the suture line with progressive gangrenous change across the forefoot.  His only complaint is left foot pain for me.  Past Medical History:  Diagnosis Date   Cancer ([place])    Lung   Diabetes mellitus    Hypertension    Renal disorder    MWF- [facility]    Past Surgical History:  Procedure Laterality Date   ABDOMINAL AORTOGRAM W/LOWER EXTREMITY Left 04/28/2021   Procedure: ABDOMINAL AORTOGRAM W/LOWER EXTREMITY;  Surgeon: Cherre Robins, MD;  Location: [facility];  Service: Cardiovascular;  Laterality: Left;   AMPUTATION Left 05/04/2021   Procedure: LEFT SECOND TOE AMPUTATION;  Surgeon: Cherre Robins, MD;  Location: [facility];  Service: Vascular;  Laterality: Left;   artiovenous graft placement Left    AV FISTULA PLACEMENT Right 11/27/2018   Procedure: INSERTION 4-7MM X 45CM GORETEX GRAFT RIGHT FOREARM ;  Surgeon: Rosetta Posner, MD;  Location: [facility];  Service: Vascular;  Laterality: Right;   [place] REMOVAL Left 08/06/2018   Procedure: REMOVAL OF LEFT ARM GRAFT;  Surgeon: Rosetta Posner, MD;  Location: [facility];  Service: Vascular;  Laterality: Left;   INSERTION OF DIALYSIS CATHETER Right 08/08/2018   Procedure: INSERTION OF TUNNELED  DIALYSIS CATHETER;  Surgeon:  Marty Heck, MD;  Location: [facility];  Service: Vascular;  Laterality: Right;   IR REMOVAL TUN CV CATH W/O FL  02/19/2019   LUNG SURGERY     patient not if they removed part of lung. Was done at [facility] Left 04/28/2021   Procedure: PERIPHERAL VASCULAR BALLOON ANGIOPLASTY;  Surgeon: Cherre Robins, MD;  Location: [facility];  Service: Cardiovascular;  Laterality: Left;  popliteal and posterial tibial   TEE WITHOUT CARDIOVERSION N/A 07/31/2018   Procedure: TRANSESOPHAGEAL ECHOCARDIOGRAM (TEE);  Surgeon: Pixie Casino, MD;  Location: [facility];  Service: Cardiovascular;  Laterality: N/A;    History reviewed. No pertinent family history.  Social History   Socioeconomic History   Marital status: Divorced    Spouse name: Not on file   Number of children: Not on file   Years of education: Not on file   Highest education level: Not on file  Occupational History   Not on file  Tobacco Use   Smoking status: Former    Packs/day: 0.50    Years: 10.00    Pack years: 5.00    Types: Cigarettes    Quit date: 2001    Years since quitting: 21.6   Smokeless tobacco: Former    Quit date: 08/01/2015  Vaping Use   Vaping Use: Never used  Substance and Sexual Activity   Alcohol use: Yes    Alcohol/week: 2.0 standard drinks    Types: 2 Cans  of beer per week   Drug use: Yes    Types: Marijuana    Comment: last time 11/19/2018   Sexual activity: Not on file    Comment: Pt uses vicodin  Other Topics Concern   Not on file  Social History Narrative   Not on file   Social Determinants of Health   Financial Resource Strain: Not on file  Food Insecurity: Not on file  Transportation Needs: Not on file  Physical Activity: Not on file  Stress: Not on file  Social Connections: Not on file  Intimate Partner Violence: Not on file    Allergies  Allergen Reactions   Sildenafil Other (See Comments)    Pt stated, "Makes my Blood Pressure drop"     Current Facility-Administered Medications  Medication Dose Route Frequency Provider Last Rate Last Admin   acetaminophen (TYLENOL) tablet 500 mg  500 mg Oral Q6H Espinoza, Alejandra, DO       aspirin chewable tablet 81 mg  81 mg Oral Daily Jim Like B, MD   81 mg at 05/19/21 1049   calcium acetate (PHOSLO) capsule 667 mg  667 mg Oral TID WC Eppie Gibson, MD   667 mg at 05/19/21 1243   ceFEPIme (MAXIPIME) 1 g in sodium chloride 0.9 % 100 mL IVPB  1 g Intravenous Q24H Jim Like B, MD       clopidogrel (PLAVIX) tablet 75 mg  75 mg Oral Daily Jim Like B, MD   75 mg at 05/19/21 1049   heparin injection 5,000 Units  5,000 Units Subcutaneous Q8H Eppie Gibson, MD   5,000 Units at 05/19/21 0758   hydrALAZINE (APRESOLINE) tablet 50 mg  50 mg Oral Q8H Jim Like B, MD   50 mg at 05/19/21 0758   insulin glargine-yfgn (SEMGLEE) injection 15 Units  15 Units Subcutaneous Daily Eppie Gibson, MD   15 Units at 05/19/21 1050   lactated ringers bolus 1,000 mL  1,000 mL Intravenous Once Eppie Gibson, MD   Held at 05/19/21 0019   multivitamin (RENA-VIT) tablet 1 tablet  1 tablet Oral QHS Eppie Gibson, MD       oxyCODONE (Oxy IR/ROXICODONE) immediate release tablet 5 mg  5 mg Oral Q6H PRN Sharion Settler, DO   5 mg at 05/19/21 1050   polyethylene glycol (MIRALAX / GLYCOLAX) packet 17 g  17 g Oral BID Eppie Gibson, MD   17 g at 05/19/21 1049   senna (SENOKOT) tablet 8.6 mg  1 tablet Oral Daily Jim Like B, MD   8.6 mg at 05/19/21 1049   vancomycin (VANCOCIN) IVPB 750 mg/150 ml premix  750 mg Intravenous Q M,W,F-HD Eppie Gibson, MD       Current Outpatient Medications  Medication Sig Dispense Refill   acetaminophen (TYLENOL) 500 MG tablet Take 1 tablet (500 mg total) by mouth every 6 (six) hours. (Patient taking differently: Take 500 mg by mouth every 6 (six) hours as needed for mild pain or headache.) 30 tablet 0   aspirin 81 MG tablet Take 81 mg by mouth  daily.       calcium acetate (PHOSLO) 667 MG capsule Take 667 mg by mouth 3 (three) times daily with meals.     calcium carbonate (OS-CAL) 1250 (500 Ca) MG chewable tablet Chew 1 tablet by mouth daily as needed for heartburn.     clopidogrel (PLAVIX) 75 MG tablet Take 1 tablet (75 mg total) by mouth daily. 30 tablet 0  hydrALAZINE (APRESOLINE) 25 MG tablet Take 2 tablets (50 mg total) by mouth every 8 (eight) hours. (Patient not taking: Reported on 11/20/2018) 90 tablet 3   insulin aspart (NOVOLOG) 100 UNIT/ML injection Before each meal 3 times a day, 140-199 - 2 units, 200-250 - 4 units, 251-299 - 6 units,  300-349 - 8 units,  350 or above 10 units. Dispense syringes and needles as needed, Ok to switch to PEN if approved. Substitute to any brand approved. DX DM2, Code E11.65 1 vial 0   insulin glargine (LANTUS) 100 UNIT/ML injection Inject 0.15 mLs (15 Units total) into the skin daily. Dispense insulin pen if approved, if not dispense as needed syringes and needles for 1 month supply. Can switch to Levemir. Diagnosis E 11.65. 10 mL 0   lidocaine-prilocaine (EMLA) cream Apply 1 application topically as needed.     Multiple Vitamin (MULITIVITAMIN WITH MINERALS) TABS Take 1 tablet by mouth daily. 30 tablet 0   oxyCODONE-acetaminophen (PERCOCET/ROXICET) 5-325 MG tablet Take 1-2 tablets by mouth every 4 (four) hours as needed for up to 18 doses for moderate pain. 30 tablet 0   polyethylene glycol (MIRALAX / GLYCOLAX) 17 g packet Take 17 g by mouth 2 (two) times daily. 14 each 0   senna (SENOKOT) 8.6 MG TABS tablet Take 1 tablet (8.6 mg total) by mouth daily. 7 tablet 0    REVIEW OF SYSTEMS:  [X]  denotes positive finding, [ ]  denotes negative finding Cardiac  Comments:  Chest pain or chest pressure:    Shortness of breath upon exertion:    Short of breath when lying flat:    Irregular heart rhythm:        Vascular    Pain in calf, thigh, or hip brought on by ambulation:    Pain in feet at night  that wakes you up from your sleep:     Blood clot in your veins:    Leg swelling:         Pulmonary    Oxygen at home:    Productive cough:     Wheezing:         Neurologic    Sudden weakness in arms or legs:     Sudden numbness in arms or legs:     Sudden onset of difficulty speaking or slurred speech:    Temporary loss of vision in one eye:     Problems with dizziness:         Gastrointestinal    Blood in stool:     Vomited blood:         Genitourinary    Burning when urinating:     Blood in urine:        Psychiatric    Major depression:         Hematologic    Bleeding problems:    Problems with blood clotting too easily:        Skin    Rashes or ulcers:        Constitutional    Fever or chills:      PHYSICAL EXAM Vitals:   05/19/21 1145 05/19/21 1200 05/19/21 1215 05/19/21 1230  BP: (!) 166/93 (!) 157/80 (!) 169/76 (!) 162/92  Pulse: 84 90 80 72  Resp: 10 15 12 19   Temp:      TempSrc:      SpO2: 100% 100% 99% 100%  Weight:      Height:        Constitutional: chronically ill appearing.  no distress. Appears well nourished.  Neurologic: CN intact. no focal findings. no sensory loss. Psychiatric:  Mood and affect symmetric and appropriate. Eyes:  No icterus. No conjunctival pallor. Ears, nose, throat:  mucous membranes moist. Midline trachea.  Cardiac: regular rate and rhythm.  Respiratory:  unlabored. Abdominal:  soft, non-tender, non-distended.  Peripheral vascular: 2+ L popliteal pulse. Trace L DP pulse.  Extremity: mild LLE edema. no cyanosis. no pallor.  Skin: progressive gangrene of left forefoot   Lymphatic: no Stemmer's sign. no palpable lymphadenopathy.  PERTINENT LABORATORY AND RADIOLOGIC DATA  Most recent CBC CBC Latest Ref Rng & Units 05/19/2021 05/18/2021 05/05/2021  WBC 4.0 - 10.5 K/uL 16.3(H) 18.9(H) 13.6(H)  Hemoglobin 13.0 - 17.0 g/dL 9.7(L) 11.0(L) 11.6(L)  Hematocrit 39.0 - 52.0 % 28.5(L) 32.3(L) 35.6(L)  Platelets 150 - 400 K/uL  247 293 274     Most recent CMP CMP Latest Ref Rng & Units 05/18/2021 05/05/2021 05/05/2021  Glucose 70 - 99 mg/dL 161(H) 170(H) 154(H)  BUN 8 - 23 mg/dL 56(H) 49(H) 45(H)  Creatinine 0.61 - 1.24 mg/dL 10.69(H) 7.50(H) 7.02(H)  Sodium 135 - 145 mmol/L 135 131(L) 131(L)  Potassium 3.5 - 5.1 mmol/L 4.1 5.0 5.1  Chloride 98 - 111 mmol/L 96(L) 91(L) 93(L)  CO2 22 - 32 mmol/L 22 22 27   Calcium 8.9 - 10.3 mg/dL 9.2 9.6 9.2  Total Protein 6.5 - 8.1 g/dL 8.7(H) - -  Total Bilirubin 0.3 - 1.2 mg/dL 1.1 - -  Alkaline Phos 38 - 126 U/L 86 - -  AST 15 - 41 U/L 42(H) - -  ALT 0 - 44 U/L 15 - -    Renal function Estimated Creatinine Clearance: 5.6 mL/min (A) (by C-G formula based on SCr of 10.69 mg/dL (H)).  Hgb A1c MFr Bld (%)  Date Value  04/27/2021 7.9 (H)    LDL Cholesterol  Date Value Ref Range Status  04/29/2021 28 0 - 99 mg/dL Final    Comment:           Total Cholesterol/HDL:CHD Risk Coronary Heart Disease Risk Table                     Men   Women  1/2 Average Risk   3.4   3.3  Average Risk       5.0   4.4  2 X Average Risk   9.6   7.1  3 X Average Risk  23.4   11.0        Use the calculated Patient Ratio above and the CHD Risk Table to determine the patient's CHD Risk.        ATP III CLASSIFICATION (LDL):  <100     mg/dL   Optimal  100-129  mg/dL   Near or Above                    Optimal  130-159  mg/dL   Borderline  160-189  mg/dL   High  >190     mg/dL   Very High Performed at [facility] [address], [place], [place] [address]      [name]. Stanford Breed, MD [facility] Phone Number: [phone] 05/19/2021 1:03 PM

## 2021-05-19 NOTE — ED Provider Notes (Signed)
[place] EMERGENCY DEPARTMENT Provider Note   CSN: [id number] Arrival date & time: 05/18/21  2121     History Chief Complaint  Patient presents with   Weakness    Roger Vazquez is a 70 y.o. male.  Patient brought in by [facility].  He was found in the bathtub by somebody from down the street.  Altered mental status with generalized weakness.  Patient had a open wound to his left foot.  Peers to have an amputation of his left 6-second toe.  Upon arrival here patient is alert and oriented.  Tells [place] that he is a dialysis patient and is normally dialyzed Monday Wednesdays and Fridays.  He is complaining of blurry vision left foot appears swollen and hot including the leg appears to be hot.  And is tachycardic.  Right foot is fine.  Patient states he is dialyzed and his right forearm.  Patient not febrile but feels hot tachycardic respiratory rate not up oxygen level 99%.  Blood pressure 180/99.      Past Medical History:  Diagnosis Date   Cancer ([place])    Lung   Diabetes mellitus    Hypertension    Renal disorder    MWF- [facility]    Patient Active Problem List   Diagnosis Date Noted   Alcohol abuse 05/16/2021   Erectile dysfunction 05/16/2021   Hydronephrosis 05/16/2021   Acute hepatitis C 05/16/2021   Diabetic retinopathy ([place]) 05/16/2021   History of hemodialysis 05/16/2021   Iron deficiency anemia 05/16/2021   Malignant neoplasm of upper lobe, left bronchus or lung ([place]) 05/16/2021   Noncompliance with medication regimen 05/16/2021   Non-small cell lung cancer ([place]) 05/16/2021   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   Vitreous hemorrhage, left eye ([place]) 05/16/2021   Cellulitis of left lower extremity    Gangrene ([place]) 04/27/2021   Diarrhea, unspecified 04/29/2019   Mild protein-calorie malnutrition ([place]) 08/20/2018   Fever    Infection of AV graft for dialysis ([place]) 08/05/2018   Symptomatic anemia 08/05/2018   Hypokalemia  08/05/2018   GERD (gastroesophageal reflux disease) 08/05/2018   Sepsis due to unspecified Staphylococcus ([place]) 08/02/2018   ESRD (end stage renal disease) ([place])    Bacteremia due to methicillin susceptible Staphylococcus aureus (MSSA) 07/28/2018   Sepsis ([place]) 07/27/2018   Headache 01/14/2017   Fluid overload, unspecified 12/13/2015   Anemia in chronic kidney disease 12/09/2015   Chronic kidney disease, stage 5 ([place]) 12/09/2015   Coagulation defect, unspecified ([place]) 12/09/2015   Encounter for adjustment and management of vascular access device 12/09/2015   Other secondary pulmonary hypertension ([place]) 12/09/2015   Pruritus, unspecified 12/09/2015   Secondary hyperparathyroidism of renal origin ([place]) 12/09/2015   Shortness of breath 12/09/2015   Abdominal pain, other specified site 10/07/2011   Nausea and vomiting 10/07/2011   VITAMIN D DEFICIENCY 03/19/2008   FRACTURE, TIBIA 02/09/2008   HTN (hypertension) 11/25/2007   HEPATITIS C 05/27/2007   Type 2 diabetes mellitus with chronic kidney disease on chronic dialysis ([place]) 05/27/2007   ERECTILE DYSFUNCTION 05/27/2007   ABUSE, ALCOHOL, IN REMISSION 05/27/2007   ABUSE, OPIOID, IN REMISSION 05/27/2007   ABUSE, COCAINE, IN REMISSION 05/27/2007    Past Surgical History:  Procedure Laterality Date   ABDOMINAL AORTOGRAM W/LOWER EXTREMITY Left 04/28/2021   Procedure: ABDOMINAL AORTOGRAM W/LOWER EXTREMITY;  Surgeon: Cherre Robins, MD;  Location: [place] [facility];  Service: Cardiovascular;  Laterality: Left;   AMPUTATION Left 05/04/2021   Procedure: LEFT  SECOND TOE AMPUTATION;  Surgeon: Cherre Robins, MD;  Location: [facility];  Service: Vascular;  Laterality: Left;   artiovenous graft placement Left    AV FISTULA PLACEMENT Right 11/27/2018   Procedure: INSERTION 4-7MM X 45CM GORETEX GRAFT RIGHT FOREARM ;  Surgeon: Rosetta Posner, MD;  Location: [place];  Service: Vascular;  Laterality: Right;   Assumption REMOVAL Left 08/06/2018   Procedure:  REMOVAL OF LEFT ARM GRAFT;  Surgeon: Rosetta Posner, MD;  Location: [place];  Service: Vascular;  Laterality: Left;   INSERTION OF DIALYSIS CATHETER Right 08/08/2018   Procedure: INSERTION OF TUNNELED  DIALYSIS CATHETER;  Surgeon: Marty Heck, MD;  Location: [place];  Service: Vascular;  Laterality: Right;   IR REMOVAL TUN CV CATH W/O FL  02/19/2019   LUNG SURGERY     patient not if they removed part of lung. Was done at [facility] Left 04/28/2021   Procedure: PERIPHERAL VASCULAR BALLOON ANGIOPLASTY;  Surgeon: Cherre Robins, MD;  Location: [place] [facility];  Service: Cardiovascular;  Laterality: Left;  popliteal and posterial tibial   TEE WITHOUT CARDIOVERSION N/A 07/31/2018   Procedure: TRANSESOPHAGEAL ECHOCARDIOGRAM (TEE);  Surgeon: Pixie Casino, MD;  Location: [facility] ENDOSCOPY;  Service: Cardiovascular;  Laterality: N/A;       History reviewed. No pertinent family history.  Social History   Tobacco Use   Smoking status: Former    Packs/day: 0.50    Years: 10.00    Pack years: 5.00    Types: Cigarettes    Quit date: 2001    Years since quitting: 21.6   Smokeless tobacco: Former    Quit date: 08/01/2015  Vaping Use   Vaping Use: Never used  Substance Use Topics   Alcohol use: Yes    Alcohol/week: 2.0 standard drinks    Types: 2 Cans of beer per week   Drug use: Yes    Types: Marijuana    Comment: last time 11/19/2018    Home Medications Prior to Admission medications   Medication Sig Start Date End Date Taking? Authorizing Provider  acetaminophen (TYLENOL) 500 MG tablet Take 1 tablet (500 mg total) by mouth every 6 (six) hours. Patient taking differently: Take 500 mg by mouth every 6 (six) hours as needed for mild pain or headache. 05/05/21   Ezequiel Essex, MD  aspirin 81 MG tablet Take 81 mg by mouth daily.      [provider]  calcium acetate (PHOSLO) 667 MG capsule Take 667 mg by mouth 3 (three) times daily with meals.     [provider]  calcium carbonate (OS-CAL) 1250 (500 Ca) MG chewable tablet Chew 1 tablet by mouth daily as needed for heartburn. 12/06/16   [provider]  clopidogrel (PLAVIX) 75 MG tablet Take 1 tablet (75 mg total) by mouth daily. 05/05/21   Ezequiel Essex, MD  hydrALAZINE (APRESOLINE) 25 MG tablet Take 2 tablets (50 mg total) by mouth every 8 (eight) hours. Patient not taking: Reported on 11/20/2018 10/12/11 08/05/18  Robbie Lis, MD  insulin aspart (NOVOLOG) 100 UNIT/ML injection Before each meal 3 times a day, 140-199 - 2 units, 200-250 - 4 units, 251-299 - 6 units,  300-349 - 8 units,  350 or above 10 units. Dispense syringes and needles as needed, Ok to switch to PEN if approved. Substitute to any brand approved. DX DM2, Code E11.65 08/02/18   Thurnell Lose, MD  insulin glargine (LANTUS) 100  UNIT/ML injection Inject 0.15 mLs (15 Units total) into the skin daily. Dispense insulin pen if approved, if not dispense as needed syringes and needles for 1 month supply. Can switch to Levemir. Diagnosis E 11.65. 08/02/18   Thurnell Lose, MD  Multiple Vitamin (MULITIVITAMIN WITH MINERALS) TABS Take 1 tablet by mouth daily. 10/12/11   Robbie Lis, MD  oxyCODONE-acetaminophen (PERCOCET/ROXICET) 5-325 MG tablet Take 1-2 tablets by mouth every 4 (four) hours as needed for up to 18 doses for moderate pain. 05/05/21   Ezequiel Essex, MD  polyethylene glycol (MIRALAX / GLYCOLAX) 17 g packet Take 17 g by mouth 2 (two) times daily. 05/05/21   Ezequiel Essex, MD  senna (SENOKOT) 8.6 MG TABS tablet Take 1 tablet (8.6 mg total) by mouth daily. 05/05/21   Ezequiel Essex, MD    Allergies    Sildenafil  Review of Systems   Review of Systems  Constitutional:  Negative for chills and fever.  HENT:  Negative for ear pain and sore throat.   Eyes:  Negative for pain and visual disturbance.  Respiratory:  Negative for cough and shortness of breath.   Cardiovascular:  Negative for  chest pain and palpitations.  Gastrointestinal:  Negative for abdominal pain and vomiting.  Genitourinary:  Negative for dysuria and hematuria.  Musculoskeletal:  Negative for arthralgias and back pain.  Skin:  Positive for wound. Negative for color change and rash.  Neurological:  Negative for seizures and syncope.  Hematological:  Bruises/bleeds easily.  Psychiatric/Behavioral:  Positive for confusion.   All other systems reviewed and are negative.  Physical Exam Updated Vital Signs BP (!) 176/90   Pulse 94   Temp 98.6 F (37 C) (Oral)   Resp 15   Ht 1.676 m (5\' 6" )   Wt 62 kg   SpO2 100%   BMI 22.06 kg/m   Physical Exam Vitals and nursing note reviewed.  Constitutional:      General: He is in acute distress.     Appearance: Normal appearance. He is well-developed.  HENT:     Head: Normocephalic and atraumatic.  Eyes:     Extraocular Movements: Extraocular movements intact.     Conjunctiva/sclera: Conjunctivae normal.     Pupils: Pupils are equal, round, and reactive to light.  Cardiovascular:     Rate and Rhythm: Normal rate and regular rhythm.     Heart sounds: No murmur heard. Pulmonary:     Effort: Pulmonary effort is normal. No respiratory distress.     Breath sounds: Normal breath sounds.  Abdominal:     Palpations: Abdomen is soft.     Tenderness: There is no abdominal tenderness.  Musculoskeletal:     Cervical back: Neck supple. No rigidity or tenderness.     Comments: AV fistula with good thrill right forearm.  Left lower extremity without any evidence of any infection.  Left lower extremity has an open wound between the big toe and third toe.  Second toe has undergone amputation.  Third toe is black in nature.  Erythema increased warmth to the foot and up into the leg area.  Skin:    General: Skin is warm and dry.  Neurological:     General: No focal deficit present.     Mental Status: He is alert.     Comments: Oriented to name and place.  Some  confusion about some historical events    ED Results / Procedures / Treatments   Labs (all labs ordered are listed, but  only abnormal results are displayed) Labs Reviewed  CBC WITH DIFFERENTIAL/PLATELET - Abnormal; Notable for the following components:      Result Value   WBC 18.9 (*)    RBC 3.12 (*)    Hemoglobin 11.0 (*)    HCT 32.3 (*)    MCV 103.5 (*)    MCH 35.3 (*)    Neutro Abs 16.2 (*)    Monocytes Absolute 1.2 (*)    Abs Immature Granulocytes 0.22 (*)    All other components within normal limits  RESP PANEL BY RT-PCR (FLU A&B, COVID) ARPGX2  CULTURE, BLOOD (ROUTINE X 2)  CULTURE, BLOOD (ROUTINE X 2)  URINE CULTURE  PROTIME-INR  APTT  LACTIC ACID, PLASMA  LACTIC ACID, PLASMA  COMPREHENSIVE METABOLIC PANEL  URINALYSIS, ROUTINE W REFLEX MICROSCOPIC    EKG EKG Interpretation  Date/Time:  Thursday May 18 2021 21:44:08 EDT Ventricular Rate:  98 PR Interval:  193 QRS Duration: 76 QT Interval:  457 QTC Calculation: 584 R Axis:   69 Text Interpretation: Sinus or ectopic atrial rhythm Sinus pause LVH with secondary repolarization abnormality Prolonged QT interval Confirmed by Fredia Sorrow [phone]) on 05/18/2021 10:56:09 PM  Radiology DG Chest Port 1 View  Result Date: 05/18/2021 CLINICAL DATA:  Sepsis EXAM: PORTABLE CHEST 1 VIEW COMPARISON:  04/27/2021 FINDINGS: Single frontal view of the chest demonstrates a stable cardiac silhouette. Chronic elevation of the left hemidiaphragm. No airspace disease, effusion, or pneumothorax. Vascular stent left subclavian distribution. IMPRESSION: 1. Stable chest, no acute process. Electronically Signed   By: Randa Ngo M.D.   On: 05/18/2021 23:45   DG Foot Complete Left  Result Date: 05/18/2021 CLINICAL DATA:  Left foot infection. EXAM: LEFT FOOT - COMPLETE 3+ VIEW COMPARISON:  Left radiograph dated 04/27/2021. FINDINGS: There is no acute fracture or dislocation. Status post amputation of the distal second metatarsal. No  periosteal elevation or bone erosion to suggest acute osteomyelitis. There is diffuse subcutaneous edema. Vascular calcifications noted. IMPRESSION: 1. No acute fracture or dislocation. Amputation of the distal second metatarsal. 2. Diffuse subcutaneous edema. Electronically Signed   By: Anner Crete M.D.   On: 05/18/2021 23:47    Procedures Procedures   Medications Ordered in ED Medications  lactated ringers infusion (has no administration in time range)  lactated ringers bolus 1,000 mL (1,000 mLs Intravenous New Bag/Given 05/18/21 2332)    And  lactated ringers bolus 1,000 mL (0 mLs Intravenous Hold 05/19/21 0019)  metroNIDAZOLE (FLAGYL) IVPB 500 mg (500 mg Intravenous New Bag/Given 05/19/21 0006)  vancomycin (VANCOREADY) IVPB 1250 mg/250 mL (has no administration in time range)  ceFEPIme (MAXIPIME) 1 g in sodium chloride 0.9 % 100 mL IVPB (has no administration in time range)  vancomycin (VANCOCIN) IVPB 750 mg/150 ml premix (has no administration in time range)  ceFEPIme (MAXIPIME) 2 g in sodium chloride 0.9 % 100 mL IVPB (0 g Intravenous Stopped 05/19/21 0005)    ED Course  I have reviewed the triage vital signs and the nursing notes.  Pertinent labs & imaging results that were available during my care of the patient were reviewed by me and considered in my medical decision making (see chart for details).    MDM Rules/Calculators/A&P                         CRITICAL CARE Performed by: Fredia Sorrow Total critical care time: 60 minutes Critical care time was exclusive of separately billable procedures and treating other patients. Critical  care was necessary to treat or prevent imminent or life-threatening deterioration. Critical care was time spent personally by me on the following activities: development of treatment plan with patient and/or surrogate as well as nursing, discussions with consultants, evaluation of patient's response to treatment, examination of patient, obtaining  history from patient or surrogate, ordering and performing treatments and interventions, ordering and review of laboratory studies, ordering and review of radiographic studies, pulse oximetry and re-evaluation of patient's condition.   Patient with some septic parameters.  Started sepsis protocol.  But since he is dialysis patient although did order 30 cc/kg of cut everything in half while were waiting on electrolytes.  Patient's white blood cell count is elevated at 18,000.  Chest x-ray negative.  X-ray of the left foot without evidence of any osteo-.  But most likely the source of the infection.  Will get CT head since he had a fall in the bathtub.  Patient will require admission.  Nephrology will need consultation.  Turned over to Dr. Lanny Cramp the overnight physician. Final Clinical Impression(s) / ED Diagnoses Final diagnoses:  Sepsis, due to unspecified organism, unspecified whether acute organ dysfunction present [facility])  Left foot infection    Rx / DC Orders ED Discharge Orders     None        Fredia Sorrow, MD 05/19/21 [phone]

## 2021-05-19 NOTE — ED Notes (Signed)
LR infusion running at slower rate, MD informed that pt is dialysis pt. MD states to halve the IVF

## 2021-05-19 NOTE — Progress Notes (Addendum)
Subjective: Patient being admitted with altered mental status noted just recently discharged after 7/14 through 05/05/2021 gangrene second toe with toe amputation.   HD is MWF at [facility] center ,last dialysis was Monday, 8/01 he does not remember why he missed Wednesday dialysis.  He does recognize me from dialysis and appears to be his baseline mental status now in the ER.currently no shortness of breath l, eft foot pain improved with pain medicine in ER.  Noted Dr. Stanford Breed notes plans for transmittal tarsal amputation 8/822  Objective Vital signs in last 24 hours: Vitals:   05/19/21 1100 05/19/21 1115 05/19/21 1130 05/19/21 1145  BP: (!) 172/84 (!) 147/76 (!) 176/82 (!) 166/93  Pulse: 85 86 80 84  Resp: 14 15 13 10   Temp:      TempSrc:      SpO2: 99% 99% 99% 100%  Weight:      Height:       Weight change:   Physical Exam: General: Alert slight lethargic thin chronically ill male NAD Heart: Irregular regular rate controlled, 2/6 stock ejection murmur, no rub or gallop appreciated Lungs: CTA anterior bilaterally nonlabored breathing Abdomen: Bowel sounds normoactive, minimal distended but nontender, no ascites or abdominal wall edema Extremities: Left lower extremity with open wound status post limitation secondary to scant bloody drainage noted 1++ edema on the left none on the right Dialysis Access: Right arm AV Gore-Tex graft positive bruit     Dialysis Orders: MWF at [facility] 3hr, EDW 60.kg, 2K/3Ca, right arm AVG, heparin 2,000 - Hectoral 3mcg IV q HD - Mircera 16mcg IV q 2 weeks - last 7/13 Venofer 100 q. dialysis last dose to be given 8/10  Problem/Plan: Altered mental status secondary to sepsis with left foot wound worsening gangrene status post left toe amputation= on IV antibiotics per admit team and noted plans by [facility]VS, Dr. Stanford Breed 8/8 transmit amputation.  Baseline has mild dementia ESRD -HD MWF, K4.1 no significant excess fluid on exam or chest x-ray with numerous more urgent  patients plan for dialysis tomorrow Saturday and back on course normal HD Monday HTN/volume -BP slightly elevated in ER secondary to pain and or missing outpatient hydralazine does not appear to be volume overloaded, continue BP meds, anticipate lower dry weight at time of discharge Anemia -Hgb 9.7 ESA last given 7/13 /22, did not have any last admission but with hemoglobin drop =continue on Aranesp 25 give next dialysis and then follow-up Hgb trend would be q. Friday when back on schedule/hold Venofer with infection process Secondary hyperparathyroidism -calcium borderline phosphorus okay continue vitamin D and binder follow-up calcium phosphorus lab Type 2 diabetes mellitus per admit  Ernest Haber, PA-C [facility] [phone] 05/19/2021,12:25 PM  LOS: 0 days   Labs: Basic Metabolic Panel: Recent Labs  Lab 05/18/21 2306  NA 135  K 4.1  CL 96*  CO2 22  GLUCOSE 161*  BUN 56*  CREATININE 10.69*  CALCIUM 9.2   Liver Function Tests: Recent Labs  Lab 05/18/21 2306  AST 42*  ALT 15  ALKPHOS 86  BILITOT 1.1  PROT 8.7*  ALBUMIN 3.5   No results for input(s): LIPASE, AMYLASE in the last 168 hours. Recent Labs  Lab 05/19/21 0557  AMMONIA 17   CBC: Recent Labs  Lab 05/18/21 2306 05/19/21 0739  WBC 18.9* 16.3*  NEUTROABS 16.2*  --   HGB 11.0* 9.7*  HCT 32.3* 28.5*  MCV 103.5* 104.8*  PLT 293 247   Cardiac Enzymes: Recent Labs  Lab 05/19/21 0557  CKTOTAL 430*   CBG: Recent Labs  Lab 05/19/21 0758 05/19/21 1147  GLUCAP 147* 167*    Studies/Results: CT Head Wo Contrast  Result Date: 05/19/2021 CLINICAL DATA:  Head trauma EXAM: CT HEAD WITHOUT CONTRAST TECHNIQUE: Contiguous axial images were obtained from the base of the skull through the vertex without intravenous contrast. COMPARISON:  None. FINDINGS: Brain: There is no mass, hemorrhage or extra-axial collection. There is generalized atrophy without lobar predilection. Hypodensity of the  white matter is most commonly associated with chronic microvascular disease. Vascular: Atherosclerotic calcification of the internal carotid arteries at the skull base. No abnormal hyperdensity of the major intracranial arteries or dural venous sinuses. Skull: The visualized skull base, calvarium and extracranial soft tissues are normal. Sinuses/Orbits: No fluid levels or advanced mucosal thickening of the visualized paranasal sinuses. No mastoid or middle ear effusion. The orbits are normal. IMPRESSION: Chronic microvascular ischemia and generalized atrophy without acute intracranial abnormality. Electronically Signed   By: Ulyses Jarred M.D.   On: 05/19/2021 02:11   MR FOOT LEFT WO CONTRAST  Result Date: 05/19/2021 CLINICAL DATA:  Weakness. Left foot swelling. Second toe amputation EXAM: MRI OF THE LEFT FOOT WITHOUT CONTRAST TECHNIQUE: Multiplanar, multisequence MR imaging of the left forefoot was performed. No intravenous contrast was administered. COMPARISON:  Radiographs 05/18/2021 FINDINGS: Bones/Joint/Cartilage Amputation of the second toe noted with a tapered and mildly irregular distal margin of the second metatarsal distally with removal of the metatarsal head. Low-level edema in the second metatarsal distally adjacent to a 2.0 by 0.2 by 0.6 cm (volume = 0.1 cm^3) fluid signal intensity collection in the soft tissues. There is adjacent edema in the flap of soft tissues along the amputation site without obvious gas tracking in the soft tissues. There is low-grade edema along the base of the distal phalanx of the great toe and along the medial margin of the distal head of the proximal phalanx as shown on image 10 series 9. Low-grade edema in the head of the third metatarsal. Mild degenerative marrow edema at the articulation between the first metatarsal head in the medial sesamoid. Ligaments The Lisfranc ligament is somewhat indistinct but probably intact. Muscles and Tendons Diffuse edema along the  plantar and dorsal musculature in the foot, probably neurogenic but technically nonspecific. Soft tissues Subcutaneous edema along the medial and dorsal foot, a partially tracking into the great toe. No drainable abscess identified. IMPRESSION: 1. Along the irregular distal margin of the second metatarsal metaphysis there is low-grade marrow edema along with a small (0.1 cubic cm) fluid collection, as well as overlying soft tissue edema tracking along the flap and the dorsal soft tissue defect. Given the irregularity of the distal metatarsal, infectious process along the metatarsal is difficult to exclude. 2. Low-grade marrow edema along the proximal interphalangeal joint of the great toe and in the head of the third metatarsal is not entirely specific and could be related to arthropathy, with early osteomyelitis a less likely differential diagnostic consideration. 3. Diffuse muscular edema, probably neurogenic, myositis less likely. Subcutaneous edema in the foot is observed and cellulitis is not excluded. Electronically Signed   By: Van Clines M.D.   On: 05/19/2021 10:04   DG Chest Port 1 View  Result Date: 05/18/2021 CLINICAL DATA:  Sepsis EXAM: PORTABLE CHEST 1 VIEW COMPARISON:  04/27/2021 FINDINGS: Single frontal view of the chest demonstrates a stable cardiac silhouette. Chronic elevation of the left hemidiaphragm. No airspace disease, effusion, or pneumothorax. Vascular stent left  subclavian distribution. IMPRESSION: 1. Stable chest, no acute process. Electronically Signed   By: Randa Ngo M.D.   On: 05/18/2021 23:45   DG Foot Complete Left  Result Date: 05/18/2021 CLINICAL DATA:  Left foot infection. EXAM: LEFT FOOT - COMPLETE 3+ VIEW COMPARISON:  Left radiograph dated 04/27/2021. FINDINGS: There is no acute fracture or dislocation. Status post amputation of the distal second metatarsal. No periosteal elevation or bone erosion to suggest acute osteomyelitis. There is diffuse subcutaneous  edema. Vascular calcifications noted. IMPRESSION: 1. No acute fracture or dislocation. Amputation of the distal second metatarsal. 2. Diffuse subcutaneous edema. Electronically Signed   By: Anner Crete M.D.   On: 05/18/2021 23:47   Medications:  ceFEPime (MAXIPIME) IV     lactated ringers Stopped (05/19/21 0019)   Vancomycin      acetaminophen  500 mg Oral Q6H   aspirin  81 mg Oral Daily   calcium acetate  667 mg Oral TID WC   clopidogrel  75 mg Oral Daily   heparin  5,000 Units Subcutaneous Q8H   hydrALAZINE  50 mg Oral Q8H   insulin glargine-yfgn  15 Units Subcutaneous Daily   multivitamin  1 tablet Oral QHS   polyethylene glycol  17 g Oral BID   senna  1 tablet Oral Daily    I have seen and examined this patient and agree with plan and assessment in the above note with renal recommendations/intervention highlighted.  Pt well known from previous admission.  Will require transmet amputation on 05/22/21.  Will plan for HD tomorrow given high hospital HD census.  Governor R[place]s Makhayla Mcmurry,MD 05/19/2021 4:25 PM

## 2021-05-19 NOTE — Sepsis Progress Note (Signed)
Elink Following Code Sepsis  

## 2021-05-19 NOTE — ED Notes (Signed)
SDU Lunch Ordered

## 2021-05-20 DIAGNOSIS — L089 Local infection of the skin and subcutaneous tissue, unspecified: Secondary | ICD-10-CM | POA: Diagnosis not present

## 2021-05-20 DIAGNOSIS — R4182 Altered mental status, unspecified: Secondary | ICD-10-CM

## 2021-05-20 LAB — GLUCOSE, CAPILLARY
Glucose-Capillary: 105 mg/dL — ABNORMAL HIGH (ref 70–99)
Glucose-Capillary: 108 mg/dL — ABNORMAL HIGH (ref 70–99)
Glucose-Capillary: 123 mg/dL — ABNORMAL HIGH (ref 70–99)
Glucose-Capillary: 176 mg/dL — ABNORMAL HIGH (ref 70–99)
Glucose-Capillary: 36 mg/dL — CL (ref 70–99)
Glucose-Capillary: 63 mg/dL — ABNORMAL LOW (ref 70–99)
Glucose-Capillary: 87 mg/dL (ref 70–99)

## 2021-05-20 LAB — RENAL FUNCTION PANEL
Albumin: 2.8 g/dL — ABNORMAL LOW (ref 3.5–5.0)
Anion gap: 17 — ABNORMAL HIGH (ref 5–15)
BUN: 65 mg/dL — ABNORMAL HIGH (ref 8–23)
CO2: 22 mmol/L (ref 22–32)
Calcium: 8.8 mg/dL — ABNORMAL LOW (ref 8.9–10.3)
Chloride: 99 mmol/L (ref 98–111)
Creatinine, Ser: 11.94 mg/dL — ABNORMAL HIGH (ref 0.61–1.24)
GFR, Estimated: 4 mL/min — ABNORMAL LOW (ref >60)
Glucose, Bld: 90 mg/dL (ref 70–99)
Phosphorus: 5.3 mg/dL — ABNORMAL HIGH (ref 2.5–4.6)
Potassium: 4 mmol/L (ref 3.5–5.1)
Sodium: 138 mmol/L (ref 135–145)

## 2021-05-20 LAB — PROCALCITONIN: Procalcitonin: 5.56 ng/mL

## 2021-05-20 LAB — CBC
HCT: 27.1 % — ABNORMAL LOW (ref 39.0–52.0)
Hemoglobin: 9.1 g/dL — ABNORMAL LOW (ref 13.0–17.0)
MCH: 34.9 pg — ABNORMAL HIGH (ref 26.0–34.0)
MCHC: 33.6 g/dL (ref 30.0–36.0)
MCV: 103.8 fL — ABNORMAL HIGH (ref 80.0–100.0)
Platelets: 261 10*3/uL (ref 150–400)
RBC: 2.61 MIL/uL — ABNORMAL LOW (ref 4.22–5.81)
RDW: 15 % (ref 11.5–15.5)
WBC: 15 10*3/uL — ABNORMAL HIGH (ref 4.0–10.5)
nRBC: 0 % (ref 0.0–0.2)

## 2021-05-20 LAB — MAGNESIUM: Magnesium: 2.1 mg/dL (ref 1.7–2.4)

## 2021-05-20 MED ORDER — DEXTROSE 50 % IV SOLN
12.5000 g | INTRAVENOUS | Status: AC
  Administered 2021-05-21: 12.5 g via INTRAVENOUS
  Filled 2021-05-20: qty 50

## 2021-05-20 MED ORDER — LORAZEPAM 2 MG/ML IJ SOLN
1.0000 mg | Freq: Once | INTRAMUSCULAR | Status: AC
  Filled 2021-05-20: qty 1

## 2021-05-20 MED ORDER — DEXTROSE 50 % IV SOLN
25.0000 g | INTRAVENOUS | Status: AC
  Administered 2021-05-20: 25 g via INTRAVENOUS

## 2021-05-20 MED ORDER — LORAZEPAM 2 MG/ML IJ SOLN
1.0000 mg | INTRAMUSCULAR | Status: DC | PRN
  Administered 2021-05-20 – 2021-05-23 (×3): 2 mg via INTRAVENOUS
  Filled 2021-05-20 (×3): qty 1

## 2021-05-20 MED ORDER — DEXTROSE 50 % IV SOLN
INTRAVENOUS | Status: AC
  Filled 2021-05-20: qty 50

## 2021-05-20 MED ORDER — INSULIN GLARGINE-YFGN 100 UNIT/ML ~~LOC~~ SOLN
10.0000 [IU] | Freq: Every day | SUBCUTANEOUS | Status: DC
  Administered 2021-05-20: 10 [IU] via SUBCUTANEOUS
  Filled 2021-05-20 (×2): qty 0.1

## 2021-05-20 MED ORDER — LORAZEPAM 1 MG PO TABS
1.0000 mg | ORAL_TABLET | ORAL | Status: DC | PRN
Start: 2021-05-20 — End: 2021-05-23
  Administered 2021-05-23: 1 mg via ORAL
  Filled 2021-05-20: qty 1

## 2021-05-20 MED ORDER — VANCOMYCIN HCL 750 MG/150ML IV SOLN
INTRAVENOUS | Status: AC
  Administered 2021-05-20: 750 mg
  Filled 2021-05-20: qty 150

## 2021-05-20 MED ORDER — SODIUM CHLORIDE 0.9 % IV SOLN
INTRAVENOUS | Status: DC | PRN
  Administered 2021-05-20: 250 mL via INTRAVENOUS
  Administered 2021-05-22: 1000 mL via INTRAVENOUS

## 2021-05-20 NOTE — Progress Notes (Signed)
   05/20/21 1547  Assess: MEWS Score  Temp (!) 97.4 F (36.3 C)  BP (!) 168/67  Pulse Rate (!) 110  ECG Heart Rate (!) 114  Resp 20  Level of Consciousness Alert  SpO2 98 %  O2 Device Room Air  Assess: MEWS Score  MEWS Temp 0  MEWS Systolic 0  MEWS Pulse 2  MEWS RR 0  MEWS LOC 0  MEWS Score 2  MEWS Score Color Yellow  Assess: if the MEWS score is Yellow or Red  Were vital signs taken at a resting state? Yes  Focused Assessment No change from prior assessment  Early Detection of Sepsis Score *See Row Information* Low  MEWS guidelines implemented *See Row Information* Yes  Treat  MEWS Interventions Administered scheduled meds/treatments  Pain Scale Faces  Faces Pain Scale 2  Pain Type Surgical pain  Pain Location Toe (Comment which one)  Pain Orientation Left  Pain Descriptors / Indicators Discomfort;Grimacing  Pain Frequency Intermittent  Pain Onset Gradual  Patients Stated Pain Goal 0  Pain Intervention(s) Repositioned  Take Vital Signs  Increase Vital Sign Frequency  Yellow: Q 2hr X 2 then Q 4hr X 2, if remains yellow, continue Q 4hrs  Escalate  MEWS: Escalate Yellow: discuss with charge nurse/RN and consider discussing with provider and RRT  Notify: Charge Nurse/RN  Name of Charge Nurse/RN Notified Jessica, RN  Date Charge Nurse/RN Notified 05/20/21  Time Charge Nurse/RN Notified 3875  Notify: Provider  Provider Name/Title Pollie Friar  Date Provider Notified 05/20/21  Time Provider Notified 1550  Notification Type Call  Notification Reason Change in status  Provider response  (Orders pending)  Date of Provider Response 05/20/21  Time of Provider Response 1554  Document  Patient Outcome Other (Comment) (Pt stable after intevention. See MAR)  Progress note created (see row info) Yes

## 2021-05-20 NOTE — Progress Notes (Signed)
Patient non-compliant, physically and verbally aggressive / combative.  Required 2 assists to safely return to bed.  Restless and exhibiting clear w/d symptoms.  MD notified by primary nurse for appropriate CIWA treatment.

## 2021-05-20 NOTE — Procedures (Signed)
I was present at this dialysis session. I have reviewed the session itself and made appropriate changes.   Vital signs in last 24 hours:  Temp:  [98.3 F (36.8 C)-99.5 F (37.5 C)] 98.3 F (36.8 C) (08/06 0829) Pulse Rate:  [31-100] 90 (08/06 0829) Resp:  [10-23] 17 (08/06 0829) BP: (140-191)/(63-107) 150/75 (08/06 0829) SpO2:  [95 %-100 %] 100 % (08/06 0829) Weight:  [61.3 kg] 61.3 kg (08/06 0603) Weight change: -0.7 kg Filed Weights   05/18/21 2143 05/20/21 0603  Weight: 62 kg 61.3 kg    Recent Labs  Lab 05/20/21 0220  NA 138  K 4.0  CL 99  CO2 22  GLUCOSE 90  BUN 65*  CREATININE 11.94*  CALCIUM 8.8*  PHOS 5.3*    Recent Labs  Lab 05/18/21 2306 05/19/21 0739 05/20/21 0220  WBC 18.9* 16.3* 15.0*  NEUTROABS 16.2*  --   --   HGB 11.0* 9.7* 9.1*  HCT 32.3* 28.5* 27.1*  MCV 103.5* 104.8* 103.8*  PLT 293 247 261    Scheduled Meds:  acetaminophen  500 mg Oral Q6H   aspirin  81 mg Oral Daily   calcium acetate  667 mg Oral TID WC   clopidogrel  75 mg Oral Daily   heparin  5,000 Units Subcutaneous Q8H   hydrALAZINE  50 mg Oral Q8H   insulin glargine-yfgn  10 Units Subcutaneous Daily   multivitamin  1 tablet Oral QHS   polyethylene glycol  17 g Oral BID   senna  1 tablet Oral Daily   Continuous Infusions:  ceFEPime (MAXIPIME) IV 1 g (05/19/21 2300)   lactated ringers Stopped (05/19/21 0019)   Vancomycin     vancomycin     PRN Meds:.oxyCODONE    Dialysis Orders: MWF at [facility] 3hr, EDW 60.kg, 2K/3Ca, right arm AVG, heparin 2,000 - Hectoral 16mcg IV q HD - Mircera 80mcg IV q 2 weeks - last 7/13 Venofer 100 q. dialysis last dose to be given 8/10   Problem/Plan: Altered mental status secondary to sepsis with left foot wound worsening gangrene status post left toe amputation= on IV antibiotics per admit team and noted plans by VVS, Dr. Stanford Breed 8/8 transmit amputation.  Baseline has mild dementia ESRD -HD MWF, K4.1 no significant excess fluid on exam or chest  x-ray with numerous more urgent patients plan for dialysis tomorrow Saturday and back on course normal HD Monday HTN/volume -BP slightly elevated in ER secondary to pain and or missing outpatient hydralazine does not appear to be volume overloaded, continue BP meds, anticipate lower dry weight at time of discharge Anemia -Hgb 9.7 ESA last given 7/13 /22, did not have any last admission but with hemoglobin drop =continue on Aranesp 25 give next dialysis and then follow-up Hgb trend would be q. Friday when back on schedule/hold Venofer with infection process Secondary hyperparathyroidism -calcium borderline phosphorus okay continue vitamin D and binder follow-up calcium phosphorus lab Type 2 diabetes mellitus per admit   Donetta Potts,  MD 05/20/2021, 9:11 AM

## 2021-05-20 NOTE — Progress Notes (Signed)
Nurse tech Benjamine Mola took patient's pm blood sugar and it is 36.  Hypoglycemia protocol was ordered.  D50 given per hypoglycemia protocol.  MD notified.  Patient is moaning at this time. Patient is unable to follow commands when asking him to sip on some water.  Unable to give po medications at this time due to patient being unable to follow commands.  Will continue to monitor.   Donah Driver, RN

## 2021-05-20 NOTE — Progress Notes (Addendum)
FPTS Brief Progress Note  S Paged by RN that CBG was 36 and that she had given pt amp of d50. Saw patient at bedside this evening. Patient laying in bed moaning. He kept his eyes closed but was able to tell me his name and then said "oh lord help me baby". He was not able to tell me where is pain was. I spoke to the RN 30 mins later who said the patient has fallen asleep after Lorazepam and that she has no acute concerns.    O: BP (!) 122/50 (BP Location: Left Arm)   Pulse 84   Temp 98 F (36.7 C) (Axillary)   Resp 18   Ht 5\' 6"  (1.676 m)   Wt 59.6 kg   SpO2 99%   BMI 21.21 kg/m    General: moaning in pain and agitated, frail male  Cardio: tachycardic  Pulm: normal work of breathing Neuro: Cranial nerves grossly intact   A/P: Plan per day team   Hypoglycemia  Likely 2/2 poor PO intake -CBG Q2H overnight -D50 PRN if CBG < 50  AMS  ETOH abuse -Lorazepam 1-4mg  Q1PRN  -Delirium precautions -Falls precautions   Pain -Oxycodone 5mg  Q6PRN -If pt is unable to tolerate PO medication he can have fentanyl IV, discussed this with RN who will page overnight with any further concerns   - Orders reviewed. Labs for AM ordered, which was adjusted as needed.  - If condition changes, plan includes.   Lattie Haw, MD 05/20/2021, 8:37 PM PGY-3, [place] [facility] Night Resident  Please page [phone] with questions.

## 2021-05-20 NOTE — Progress Notes (Signed)
FPTS Interim Night Progress Note  S:Patient awake and resting comfortably. Orientated to person only. Denies any pain. Rounded with primary night RN.  Reports patient trying to get out of bed and has been like this since around 10 pm. Reports that mentation was not confused during day shift.  Intern was notified and evaluated. RN has no further concerns voiced.  No orders required.    O: Today's Vitals   05/20/21 0000 05/20/21 0015 05/20/21 0100 05/20/21 0410  BP:  140/63  (!) 158/71  Pulse:  82 90 79  Resp: 19 16 13 14   Temp:  98.4 F (36.9 C)  98.5 F (36.9 C)  TempSrc:  Oral  Oral  SpO2:   95% 100%  Weight:      Height:      PainSc:        A/P: No focal deficits on exam so less likely CVA/ICB.  No tachycardia, hallucinations or tremors doubt DT. Unclear when last EtOH.  Likely delirium in setting of sepsis  Agree with interns assessment and plan, 1:1 sitter, delirium precautions, continue to monitor  Carollee Leitz MD PGY-3, [place] Medicine Service pager [phone]

## 2021-05-20 NOTE — Progress Notes (Addendum)
Pt's CBG was 66. Pt given apple juice and with coaching pt drank soda Pts CBG rechecked and it is now 108.

## 2021-05-20 NOTE — Progress Notes (Signed)
Rechecked patient's blood sugar 9mins after administration of D50.  Patient's blood sugar is now 176.  MD notified.  Will continue to monitor.    Donah Driver, RN

## 2021-05-20 NOTE — Progress Notes (Addendum)
Family Medicine Teaching Service Daily Progress Note Intern Pager: [phone]  Patient name: CASPIAN DELEONARDIS Medical record number: [id number] Date of birth: [birth year] Age: 70 y.o. Gender: male  Primary Care Provider: [facility], [facility] Consultants: VVS, Nephrology Code Status: Full  Pt Overview and Major Events to Date:  8/5- admitted 8/8- scheduled for transmetatarsal amputation  Assessment and Plan: Mr. Lance is a 70 year old male who presented with altered mental status and worsening gangrene of his left foot.  His medical history is significant for ESRD on HD MWF, history of lung cancer, PAD, HTN, DM2, polysubstance abuse.  Altered Mental Status  Hx of Polysubstance abuse Per day team notes, patient had initially improved to baseline during the day. However, per night RN, he became increasingly confused from 10p on. Not aggressive. No tremor. Easily redirectable. No focal neuro deficit. Last CIWA elevated to 11.  He is quite hard of hearing at baseline. Given that he is easily redirectable and was at his baseline during the day, consider that this may represent sundowning vs. "ICU delirium."  Also consider etOH withdrawal given that we do not know when his last drink was, but it is possible that it was sometime between 48 and 72 hours ago. EtOH level on admission was neg.  Head CT on admission was negative for acute stroke. Given that he has no focal neuro deficits and was at baseline during the day, I do not feel that he needs follow-up head imaging.  - 1-to-1 sitter - Delirium precautions - Bed set to lowest level  - If he becomes shaky or agitated, or becomes higher risk for falling out of bed, will add Ativan to CIWA protocol - Continue to monitor CIWAs  Gangrene of left foot Still with pain and left foot, though this is well controlled on current regimen.  Evaluated by VVS yesterday with recommendation for transmetatarsal amputation on 8/8. WBC downtrending  18.9>>15.0.  Blood culture pending. -Transmetatarsal amputation on 8/8 -Pain control with scheduled Tylenol and as needed oxycodone 5 mg every 6 hours  -Continue with vancomycin and cefepime -Follow-up blood cultures, narrow antibiotics when able -ABIs pending  ESRD on HD Seen by nephrology yesterday with recommendation to initiate hospital dialysis. -Will be dialyzed in hospital today, return to MWF schedule on Monday -Continue renal vitamin -Continue PhosLo  T2DM Glucoses have been 66-108, received 15 units Lantus this morning.  Has not been eating as well as he does at home. -We will decrease Lantus to 10 units in the morning  HTN  PAD Last BP 140/63. -Continue ASA, Plavix -Continue home hydralazine  Constipation  -Continue MiraLAX and senna  FEN/GI: Renal/carb modified, 2000 mL fluid restriction PPx: Heparin Dispo: To be determined     . Barriers include surgery on 8/8.   Subjective:  I first evaluated Mr. Mota around 2am. At that time he said he was "good" and had no acute concerns.  After being paged by his nurse to re-evaluate, I saw him again at around 4am. At that time he was still oriented to self and answered most questions appropriately, but denied having any pain. He was trying to sit up in bed during the exam and required constant redirection to participate in the neuro portions of the exam.   Objective: Temp:  [98.4 F (36.9 C)-99.5 F (37.5 C)] 98.5 F (36.9 C) (08/06 0410) Pulse Rate:  [31-102] 79 (08/06 0410) Resp:  [10-23] 14 (08/06 0410) BP: (140-197)/(63-112) 158/71 (08/06 0410) SpO2:  [90 %-100 %] 100 % (  08/06 0410) Physical Exam: General: Elderly male, NAD Cardiovascular: Irregular, 2 out of 6 systolic murmur Respiratory: Lungs clear to auscultation anteriorly, normal work of breathing on room air Extremities: Left lower extremity with dressing in place. No streaking or erythema extending proximally, very faint distal pulses, unchanged from previous Neuro:  Exam limited by patient participation, but moves all extremities independently and with coordination. No facial asymmetry noted. EOMs intact. Pupils PERRL. Palate rise symmetric. Spontaneous lateral head rotation and neck flexion observed.    Laboratory: Recent Labs  Lab 05/18/21 2306 05/19/21 0739 05/20/21 0220  WBC 18.9* 16.3* 15.0*  HGB 11.0* 9.7* 9.1*  HCT 32.3* 28.5* 27.1*  PLT 293 247 261   Recent Labs  Lab 05/18/21 2306 05/19/21 1741 05/20/21 0220  NA 135 138 138  K 4.1 3.6 4.0  CL 96* 100 99  CO2 22 22 22   BUN 56* 61* 65*  CREATININE 10.69* 11.30* 11.94*  CALCIUM 9.2 9.1 8.8*  PROT 8.7*  --   --   BILITOT 1.1  --   --   ALKPHOS 86  --   --   ALT 15  --   --   AST 42*  --   --   GLUCOSE 161* 82 90     Imaging/Diagnostic Tests: MR Left Foot IMPRESSION: 1. Along the irregular distal margin of the second metatarsal metaphysis there is low-grade marrow edema along with a small (0.1 cubic cm) fluid collection, as well as overlying soft tissue edema tracking along the flap and the dorsal soft tissue defect. Given the irregularity of the distal metatarsal, infectious process along the metatarsal is difficult to exclude. 2. Low-grade marrow edema along the proximal interphalangeal joint of the great toe and in the head of the third metatarsal is not entirely specific and could be related to arthropathy, with early osteomyelitis a less likely differential diagnostic consideration. 3. Diffuse muscular edema, probably neurogenic, myositis less likely. Subcutaneous edema in the foot is observed and cellulitis is not excluded.  Eppie Gibson, MD 05/20/2021, 4:40 AM PGY-1, [place] Intern pager: [phone], text pages welcome

## 2021-05-20 NOTE — Progress Notes (Signed)
Pt is very agitated and trying to get OOB. Pt educated on fall risk and safety. BP 168/67, HR 110, Temp 97.4 ( axillary), RR 20. MEWS yellow. Family medicine made aware

## 2021-05-21 DIAGNOSIS — L089 Local infection of the skin and subcutaneous tissue, unspecified: Secondary | ICD-10-CM | POA: Diagnosis not present

## 2021-05-21 DIAGNOSIS — A419 Sepsis, unspecified organism: Secondary | ICD-10-CM | POA: Diagnosis not present

## 2021-05-21 DIAGNOSIS — R4182 Altered mental status, unspecified: Secondary | ICD-10-CM | POA: Diagnosis not present

## 2021-05-21 LAB — GLUCOSE, CAPILLARY
Glucose-Capillary: 101 mg/dL — ABNORMAL HIGH (ref 70–99)
Glucose-Capillary: 105 mg/dL — ABNORMAL HIGH (ref 70–99)
Glucose-Capillary: 116 mg/dL — ABNORMAL HIGH (ref 70–99)
Glucose-Capillary: 137 mg/dL — ABNORMAL HIGH (ref 70–99)
Glucose-Capillary: 144 mg/dL — ABNORMAL HIGH (ref 70–99)
Glucose-Capillary: 154 mg/dL — ABNORMAL HIGH (ref 70–99)
Glucose-Capillary: 184 mg/dL — ABNORMAL HIGH (ref 70–99)
Glucose-Capillary: 48 mg/dL — ABNORMAL LOW (ref 70–99)
Glucose-Capillary: 52 mg/dL — ABNORMAL LOW (ref 70–99)
Glucose-Capillary: 92 mg/dL (ref 70–99)

## 2021-05-21 LAB — CBC
HCT: 29.3 % — ABNORMAL LOW (ref 39.0–52.0)
Hemoglobin: 9.8 g/dL — ABNORMAL LOW (ref 13.0–17.0)
MCH: 34.1 pg — ABNORMAL HIGH (ref 26.0–34.0)
MCHC: 33.4 g/dL (ref 30.0–36.0)
MCV: 102.1 fL — ABNORMAL HIGH (ref 80.0–100.0)
Platelets: 271 10*3/uL (ref 150–400)
RBC: 2.87 MIL/uL — ABNORMAL LOW (ref 4.22–5.81)
RDW: 14.8 % (ref 11.5–15.5)
WBC: 16 10*3/uL — ABNORMAL HIGH (ref 4.0–10.5)
nRBC: 0 % (ref 0.0–0.2)

## 2021-05-21 LAB — RENAL FUNCTION PANEL
Albumin: 2.8 g/dL — ABNORMAL LOW (ref 3.5–5.0)
Anion gap: 14 (ref 5–15)
BUN: 38 mg/dL — ABNORMAL HIGH (ref 8–23)
CO2: 23 mmol/L (ref 22–32)
Calcium: 8.8 mg/dL — ABNORMAL LOW (ref 8.9–10.3)
Chloride: 99 mmol/L (ref 98–111)
Creatinine, Ser: 8.29 mg/dL — ABNORMAL HIGH (ref 0.61–1.24)
GFR, Estimated: 6 mL/min — ABNORMAL LOW (ref >60)
Glucose, Bld: 67 mg/dL — ABNORMAL LOW (ref 70–99)
Phosphorus: 3.9 mg/dL (ref 2.5–4.6)
Potassium: 3.4 mmol/L — ABNORMAL LOW (ref 3.5–5.1)
Sodium: 136 mmol/L (ref 135–145)

## 2021-05-21 MED ORDER — DEXTROSE 50 % IV SOLN
25.0000 g | INTRAVENOUS | Status: AC
  Administered 2021-05-21: 25 g via INTRAVENOUS

## 2021-05-21 MED ORDER — SENNA 8.6 MG PO TABS
1.0000 | ORAL_TABLET | Freq: Two times a day (BID) | ORAL | Status: DC
  Administered 2021-05-21 (×2): 8.6 mg via ORAL
  Filled 2021-05-21: qty 1

## 2021-05-21 MED ORDER — DOXERCALCIFEROL 4 MCG/2ML IV SOLN
2.0000 ug | INTRAVENOUS | Status: DC
  Filled 2021-05-21: qty 2

## 2021-05-21 MED ORDER — DEXTROSE-NACL 10-0.45 % IV SOLN
INTRAVENOUS | Status: AC
  Filled 2021-05-21: qty 1000

## 2021-05-21 MED ORDER — DEXTROSE 50 % IV SOLN
INTRAVENOUS | Status: AC
  Filled 2021-05-21: qty 50

## 2021-05-21 MED ORDER — FENTANYL CITRATE (PF) 100 MCG/2ML IJ SOLN
25.0000 ug | Freq: Once | INTRAMUSCULAR | Status: AC
Start: 2021-05-21 — End: 2021-05-21
  Administered 2021-05-21: 25 ug via INTRAVENOUS
  Filled 2021-05-21: qty 2

## 2021-05-21 MED ORDER — DEXTROSE 50 % IV SOLN
25.0000 g | INTRAVENOUS | Status: AC
  Administered 2021-05-21: 25 g via INTRAVENOUS
  Filled 2021-05-21: qty 50

## 2021-05-21 MED ORDER — DARBEPOETIN ALFA 25 MCG/0.42ML IJ SOSY
25.0000 ug | PREFILLED_SYRINGE | INTRAMUSCULAR | Status: DC
  Filled 2021-05-21: qty 0.42

## 2021-05-21 NOTE — Progress Notes (Addendum)
  Progress Note    05/21/2021 9:15 AM * No surgery date entered *  Subjective:  Somnolent during exam but says he is without pain   Vitals:   05/21/21 0500 05/21/21 0744  BP: (!) 144/62 140/89  Pulse: 86 88  Resp: 16 20  Temp: 97.6 F (36.4 C) 99.3 F (37.4 C)  SpO2:  98%   Physical Exam: Lungs:  non labored  Incisions:  dressing left in place left foot Neurologic: somnolent during exam  CBC    Component Value Date/Time   WBC 16.0 (H) 05/21/2021 0135   RBC 2.87 (L) 05/21/2021 0135   HGB 9.8 (L) 05/21/2021 0135   HCT 29.3 (L) 05/21/2021 0135   PLT 271 05/21/2021 0135   MCV 102.1 (H) 05/21/2021 0135   MCH 34.1 (H) 05/21/2021 0135   MCHC 33.4 05/21/2021 0135   RDW 14.8 05/21/2021 0135   LYMPHSABS 1.3 05/18/2021 2306   MONOABS 1.2 (H) 05/18/2021 2306   EOSABS 0.0 05/18/2021 2306   BASOSABS 0.1 05/18/2021 2306    BMET    Component Value Date/Time   NA 136 05/21/2021 0135   K 3.4 (L) 05/21/2021 0135   CL 99 05/21/2021 0135   CO2 23 05/21/2021 0135   GLUCOSE 67 (L) 05/21/2021 0135   BUN 38 (H) 05/21/2021 0135   CREATININE 8.29 (H) 05/21/2021 0135   CALCIUM 8.8 (L) 05/21/2021 0135   CALCIUM 7.1 (L) 08/01/2018 0944   GFRNONAA 6 (L) 05/21/2021 0135   GFRAA 5 (L) 08/11/2018 0858    INR    Component Value Date/Time   INR 1.1 05/18/2021 2306     Intake/Output Summary (Last 24 hours) at 05/21/2021 0915 Last data filed at 05/20/2021 1800 Gross per 24 hour  Intake 50 ml  Output 2000 ml  Net -1950 ml     Assessment/Plan:  70 y.o. male with non healing toe amp sites  Plan is for transmetatarsal amputation of left foot tomorrow Consent ordered Keep NPO past midnight Please dialyze patient either 1st shift or 3rd shift tomorrow to not interfere with surgery schedule    Dagoberto Ligas, PA-C [facility] [phone] 05/21/2021 9:15 AM  VASCULAR STAFF ADDENDUM: I agree with the above.   [name]. Stanford Breed, MD [facility] Phone Number: [phone] 05/21/2021 10:12 AM

## 2021-05-21 NOTE — Progress Notes (Signed)
Patient's am blood sugar is 52.  D50 given per hypoglycemia protocol...  MD made aware.  Will continue to monitor.   Donah Driver, RN

## 2021-05-21 NOTE — H&P (View-Only) (Signed)
  Progress Note    05/21/2021 9:15 AM * No surgery date entered *  Subjective:  Somnolent during exam but says he is without pain   Vitals:   05/21/21 0500 05/21/21 0744  BP: (!) 144/62 140/89  Pulse: 86 88  Resp: 16 20  Temp: 97.6 F (36.4 C) 99.3 F (37.4 C)  SpO2:  98%   Physical Exam: Lungs:  non labored  Incisions:  dressing left in place left foot Neurologic: somnolent during exam  CBC    Component Value Date/Time   WBC 16.0 (H) 05/21/2021 0135   RBC 2.87 (L) 05/21/2021 0135   HGB 9.8 (L) 05/21/2021 0135   HCT 29.3 (L) 05/21/2021 0135   PLT 271 05/21/2021 0135   MCV 102.1 (H) 05/21/2021 0135   MCH 34.1 (H) 05/21/2021 0135   MCHC 33.4 05/21/2021 0135   RDW 14.8 05/21/2021 0135   LYMPHSABS 1.3 05/18/2021 2306   MONOABS 1.2 (H) 05/18/2021 2306   EOSABS 0.0 05/18/2021 2306   BASOSABS 0.1 05/18/2021 2306    BMET    Component Value Date/Time   NA 136 05/21/2021 0135   K 3.4 (L) 05/21/2021 0135   CL 99 05/21/2021 0135   CO2 23 05/21/2021 0135   GLUCOSE 67 (L) 05/21/2021 0135   BUN 38 (H) 05/21/2021 0135   CREATININE 8.29 (H) 05/21/2021 0135   CALCIUM 8.8 (L) 05/21/2021 0135   CALCIUM 7.1 (L) 08/01/2018 0944   GFRNONAA 6 (L) 05/21/2021 0135   GFRAA 5 (L) 08/11/2018 0858    INR    Component Value Date/Time   INR 1.1 05/18/2021 2306     Intake/Output Summary (Last 24 hours) at 05/21/2021 0915 Last data filed at 05/20/2021 1800 Gross per 24 hour  Intake 50 ml  Output 2000 ml  Net -1950 ml     Assessment/Plan:  70 y.o. male with non healing toe amp sites  Plan is for transmetatarsal amputation of left foot tomorrow Consent ordered Keep NPO past midnight Please dialyze patient either 1st shift or 3rd shift tomorrow to not interfere with surgery schedule    Dagoberto Ligas, PA-C [facility] [phone] 05/21/2021 9:15 AM  VASCULAR STAFF ADDENDUM: I agree with the above.   [name]. Stanford Breed, MD [facility] Phone Number: [phone] 05/21/2021 10:12 AM

## 2021-05-21 NOTE — Progress Notes (Signed)
Pharmacy Antibiotic Note  Roger Vazquez is a 70 y.o. male admitted on 05/18/2021 with sepsis secondary to left foot gangrene.  Pharmacy has been consulted for vancomycin and cefepime dosing. Pt was off schedule with HD and received last HD session 8/6. Pt received post-HD vancomycin dose. Planning to get back on MWF HD schedule starting on 8/8. He is scheduled for transmetatarsal amputation on 8/8. WBC 16 today and remains afebrile.   Plan: Continue vancomycin 750mg  MWF post HD Continue cefepime 1g IV q24h F/u HD schedule --restarting MWF--and that post-HD dose is given Monitor clinical status, and antibiotic plan post-surgery  Height: 5\' 6"  (167.6 cm) Weight: 58.3 kg (128 lb 8.5 oz) IBW/kg (Calculated) : 63.8  Temp (24hrs), Avg:98.3 F (36.8 C), Min:97.4 F (36.3 C), Max:99.3 F (37.4 C)  Recent Labs  Lab 05/18/21 2306 05/19/21 0337 05/19/21 0739 05/19/21 1741 05/20/21 0220 05/21/21 0135  WBC 18.9*  --  16.3*  --  15.0* 16.0*  CREATININE 10.69*  --   --  11.30* 11.94* 8.29*  LATICACIDVEN 1.4 0.9  --   --   --   --      Estimated Creatinine Clearance: 6.7 mL/min (A) (by C-G formula based on SCr of 8.29 mg/dL (H)).    Allergies  Allergen Reactions   Sildenafil Other (See Comments)    Pt stated, "Makes my Blood Pressure drop"    Antimicrobials this admission: Vanc 8/4 >>  Cefepime 8/4 >>   Dose adjustments this admission: N/A  Microbiology results: 8/4 BCx: no growth x 2 days  Thank you for involving pharmacy in this patient's care.  Elita Quick, PharmD PGY1 Ambulatory Care Pharmacy Resident 05/21/2021 2:15 PM  **Pharmacist phone directory can be found on [email or web address] listed under [place]**

## 2021-05-21 NOTE — Progress Notes (Signed)
Patient's blood sugar is 48.  MD notified.  D50 pushed per Hypoglycemia protocol. Patient is moaning in pain at this time. Will continue to monitor.     Donah Driver, RN

## 2021-05-21 NOTE — Progress Notes (Signed)
Rechecked patient's blood sugar after administration of D50.  Blood sugar is now 154.  Patient continues to moan in pain.  MD is at bedside and made aware of blood sugar. Will continue to monitor.   Donah Driver, RN

## 2021-05-21 NOTE — Progress Notes (Signed)
Family Medicine Teaching Service Daily Progress Note Intern Pager: [phone]  Patient name: Roger Vazquez Medical record number: [id number] Date of birth: December 11, 1950 Age: 70 y.o. Gender: male  Primary Care Provider: [facility], [facility] Consultants: Vascular surgery, nephrology Code Status: FULL  Pt Overview and Major Events to Date:  8/5: admitted 8/6: significant hypoglycemia overnight 8/8: scheduled for transmetatarsal amputation  Assessment and Plan:  Roger Vazquez is a 70 y.o. male who presented with altered mental status and worsening left foot gangrene. PMHx significant for ESRD on HD MWF, hx of lung cancer, PAD, HTN, T2DM, and polysubstance abuse.  Altered Mental Status  Hx polysubstance abuse Patient intermittently moaning overnight, but was not agitated or uncooperative at any point. He received 1mg  Ativan early in the evening due to CIWA of 12. Later received 47mcg IV fentanyl for pain control which then allowed him to rest comfortably. Was oriented to self only. Etiology of his AMS unclear but thought to be a component of delirium given his acute illness and baseline mild dementia. Monitoring for EtOH withdrawal as well, although he denied recent alcohol use on admission an ethanol level was <10. -Monitor mental status closely  -CIWA monitoring with Ativan -Delirium precautions -Consider making NPO and obtaining SLP eval if mental status not improving during daytime hours  Hypoglycemia  T2DM Patient with multiple episodes of hypoglycemia down to 36, 63, and 48 overnight. Received 2.5 amps of D50 overnight with improvement each time. Has not been eating well, also with hx of ESRD and received 10u lantus yesterday. -d/c Lantus -CBG monitoring q1-2 hours -Nutrition consult  Left Foot Gangrene -Awaiting transmetatarsal amputation on 8/8 -Continue vanc and cefepime -Pain control with Tylenol 500mg  q6h scheduled and oxycodone prn -If mental status prohibits PO  intake, recommend d/c oxycodone and start IV Fentanyl prn -f/u ABI results  ESRD on HD Last HD yesterday, 8/6 (2L removed). -Nephro following, appreciate recommendations -Continue renavit and phoslo  HTN BP has been appropriate overall. Few elevated readings up to 403K systolic. -Continue home hydralazine  Constipation Last BM unknown. No charted bowel movements since admission. Nurse unaware of any undocumented stool. -Continue home Miralax -Increase Senna to BID   FEN/GI: Renal/carb modified, 2L fluid restriction PPx: Heparin Dispo: TBD . Barriers include awaiting surgery on 8/8.   Subjective:  Patient with several episodes of hypoglycemia overnight requiring amps of D50. Also with altered mental status from baseline, even after resolution of the hypoglycemia. Upon evaluation at 3:15am, patient moaning in pain. Stated "everything hurts". Was oriented only to self at this time. Did not reliably follow commands, but neuro exam was nonfocal. He was given a dose of IV Fentanyl 58mcg for pain control due to concern about inability to take PO oxy given his mental status. Upon re-eval at 5:15am, patient resting comfortably in bed. Denied complaints. Was oriented to self and place.  Objective: Temp:  [97.4 F (36.3 C)-98.5 F (36.9 C)] 98 F (36.7 C) (08/06 1920) Pulse Rate:  [58-110] 103 (08/06 2140) Resp:  [13-22] 18 (08/06 1920) BP: (107-168)/(48-95) 157/76 (08/06 2140) SpO2:  [95 %-100 %] 99 % (08/06 1801) Weight:  [59.6 kg-61.3 kg] 59.6 kg (08/06 0853) Physical Exam: General: resting comfortably in bed, awakens to light touch, mittens in place on bilateral hands Cardiovascular: RRR, normal S1/S2 Respiratory: no IWOB on room air Abdomen: mildly distended, nontender Extremities: dressing clean/dry/intact on left foot Neuro: answers questions appropriately, oriented to self and place only, moves all extremities equally  Laboratory: Recent Labs  Lab 05/18/21 2306  05/19/21 0739 05/20/21 0220  WBC 18.9* 16.3* 15.0*  HGB 11.0* 9.7* 9.1*  HCT 32.3* 28.5* 27.1*  PLT 293 247 261   Recent Labs  Lab 05/18/21 2306 05/19/21 1741 05/20/21 0220  NA 135 138 138  K 4.1 3.6 4.0  CL 96* 100 99  CO2 22 22 22   BUN 56* 61* 65*  CREATININE 10.69* 11.30* 11.94*  CALCIUM 9.2 9.1 8.8*  PROT 8.7*  --   --   BILITOT 1.1  --   --   ALKPHOS 86  --   --   ALT 15  --   --   AST 42*  --   --   GLUCOSE 161* 82 90     Imaging/Diagnostic Tests: No new imaging in past 24h.   Alcus Dad, MD 05/21/2021, 12:18 AM PGY-2, [place] Intern pager: [phone], text pages welcome

## 2021-05-21 NOTE — Progress Notes (Signed)
Patient's blood sugar rechecked is 137.  Will continue to monitor.   Donah Driver, RN

## 2021-05-21 NOTE — Progress Notes (Addendum)
FPTS Interim Progress Note  S:Patient is awake and resting comfortably. Oriented to person and place. He denies pain. He has not had any agitation, per primary night RN. No concerns voiced by RN. RN informed us patient's sister would like MD to speak with her before signing consent for operation with vascular surgery in the morning.  O: BP 124/62 (BP Location: Left Arm)   Pulse 93   Temp 98.4 F (36.9 C) (Oral)   Resp 18   Ht 5\' 6"  (1.676 m)   Wt 58.3 kg   SpO2 99%   BMI 20.74 kg/m     A/P: -NPO at midnight in anticipation of transmetatarsal amputation with vascular surgery tomorrow at 1100 -Continue D10 @ 25 ml/hr per nephro -Continue IV Vanc and Cefepmine - Pain control with Tylenol 500mg  q6h, scheduled and oxycodone prn -CIWA monitoring with Ativan -Delirium precautions -Monitor CBG q1-2 hours, Lantus d/c'ed -Orders reviewed. Labs for AM ordered.  Orvis Brill, DO 05/21/2021, 11:13 PM PGY-1, [place] Medicine Service pager [phone]

## 2021-05-21 NOTE — Progress Notes (Signed)
Patient's blood sugar is 63 at this time.  D50 given per Hypoglycemia protocol.  MD notified.  Will continue to monitor.   Donah Driver, RN

## 2021-05-21 NOTE — Progress Notes (Addendum)
Subjective: No complaints, pleasantly confused but is aware he is at [facility], tolerated 2 L UF HD yesterday off schedule, secondary to prior patient load HD planned tomorrow on schedule, noted admit team managing low blood sugars  Objective Vital signs in last 24 hours: Vitals:   05/20/21 1920 05/20/21 2140 05/21/21 0500 05/21/21 0744  BP: (!) 122/50 (!) 157/76 (!) 144/62 140/89  Pulse: 84 (!) 103 86 88  Resp: 18  16 20   Temp: 98 F (36.7 C)  97.6 F (36.4 C) 99.3 F (37.4 C)  TempSrc: Axillary  Axillary Axillary  SpO2:    98%  Weight:   58.3 kg   Height:       Weight change: -1.7 kg  Physical Exam: General: Alert pleasantly confused , is aware at [facility] thin chronically ill male NAD Heart: Irregular regular rate controlled, 2/6 stock ejection murmur, no rub or gallop appreciated Lungs: CTA anterior bilaterally nonlabored breathing Abdomen: Bowel sounds normoactive, minimal distended but nontender, no ascites or abdominal wall edema Extremities: Left lower extremity bandage clean dry, trace edema on the left none on the right Dialysis Access: Right arm AV Gore-Tex graft positive bruit   Dialysis Orders: MWF at [facility] [address] 3hr, EDW 60.kg, 2K/3Ca, right arm AVG, heparin 2,000 - Hectoral 48mcg IV q HD - Mircera 47mcg IV q 2 weeks - last 7/13 Venofer 100 q. dialysis last dose to be given 8/10   Problem/Plan: Altered mental status secondary to sepsis with left foot wound worsening gangrene status post left toe amputation= on IV antibiotics per admit team and noted plans by VVS, Dr. Stanford Breed 8/8 transmit amputation.  Baseline has mild dementia ESRD -HD MWF, K 3.4.  HD yesterday off schedule 2/2 more urgent patients plan normal HD Monday 8/08 use 4K bath HTN/volume -BP stable/ does not appear to be volume overloaded, continue hydralazine 50 every 8 hr, follow-up BP may be able to taper down hydralazine with HD UF, anticipate lower dry weight at time of discharge Anemia -Hgb 9.7>  9.8, Aranesp 25 CG given 8/06, HD (yesterday off schedule HD )plan to continue q. Friday, follow-up Hgb hold Venofer with infection  Secondary hyperparathyroidism -calcium borderline phosphorus okay continue vitamin D and binder follow-up lab  Type 2 diabetes mellitus per admit   Ernest Haber, PA-C [facility]per [phone] 05/21/2021,8:30 AM  LOS: 2 days   Labs: Basic Metabolic Panel: Recent Labs  Lab 05/19/21 1741 05/20/21 0220 05/21/21 0135  NA 138 138 136  K 3.6 4.0 3.4*  CL 100 99 99  CO2 22 22 23   GLUCOSE 82 90 67*  BUN 61* 65* 38*  CREATININE 11.30* 11.94* 8.29*  CALCIUM 9.1 8.8* 8.8*  PHOS 5.0* 5.3* 3.9   Liver Function Tests: Recent Labs  Lab 05/18/21 2306 05/19/21 1741 05/20/21 0220 05/21/21 0135  AST 42*  --   --   --   ALT 15  --   --   --   ALKPHOS 86  --   --   --   BILITOT 1.1  --   --   --   PROT 8.7*  --   --   --   ALBUMIN 3.5 3.0* 2.8* 2.8*   No results for input(s): LIPASE, AMYLASE in the last 168 hours. Recent Labs  Lab 05/19/21 0557  AMMONIA 17   CBC: Recent Labs  Lab 05/18/21 2306 05/19/21 0739 05/20/21 0220 05/21/21 0135  WBC 18.9* 16.3* 15.0* 16.0*  NEUTROABS 16.2*  --   --   --  HGB 11.0* 9.7* 9.1* 9.8*  HCT 32.3* 28.5* 27.1* 29.3*  MCV 103.5* 104.8* 103.8* 102.1*  PLT 293 247 261 271   Cardiac Enzymes: Recent Labs  Lab 05/19/21 0557  CKTOTAL 430*   CBG: Recent Labs  Lab 05/21/21 0316 05/21/21 0509 05/21/21 0625 05/21/21 0702 05/21/21 0747  GLUCAP 154* 92 52* 184* 144*    Studies/Results: MR FOOT LEFT WO CONTRAST  Result Date: 05/19/2021 CLINICAL DATA:  Weakness. Left foot swelling. Second toe amputation EXAM: MRI OF THE LEFT FOOT WITHOUT CONTRAST TECHNIQUE: Multiplanar, multisequence MR imaging of the left forefoot was performed. No intravenous contrast was administered. COMPARISON:  Radiographs 05/18/2021 FINDINGS: Bones/Joint/Cartilage Amputation of the second toe noted with a tapered and  mildly irregular distal margin of the second metatarsal distally with removal of the metatarsal head. Low-level edema in the second metatarsal distally adjacent to a 2.0 by 0.2 by 0.6 cm (volume = 0.1 cm^3) fluid signal intensity collection in the soft tissues. There is adjacent edema in the flap of soft tissues along the amputation site without obvious gas tracking in the soft tissues. There is low-grade edema along the base of the distal phalanx of the great toe and along the medial margin of the distal head of the proximal phalanx as shown on image 10 series 9. Low-grade edema in the head of the third metatarsal. Mild degenerative marrow edema at the articulation between the first metatarsal head in the medial sesamoid. Ligaments The Lisfranc ligament is somewhat indistinct but probably intact. Muscles and Tendons Diffuse edema along the plantar and dorsal musculature in the foot, probably neurogenic but technically nonspecific. Soft tissues Subcutaneous edema along the medial and dorsal foot, a partially tracking into the great toe. No drainable abscess identified. IMPRESSION: 1. Along the irregular distal margin of the second metatarsal metaphysis there is low-grade marrow edema along with a small (0.1 cubic cm) fluid collection, as well as overlying soft tissue edema tracking along the flap and the dorsal soft tissue defect. Given the irregularity of the distal metatarsal, infectious process along the metatarsal is difficult to exclude. 2. Low-grade marrow edema along the proximal interphalangeal joint of the great toe and in the head of the third metatarsal is not entirely specific and could be related to arthropathy, with early osteomyelitis a less likely differential diagnostic consideration. 3. Diffuse muscular edema, probably neurogenic, myositis less likely. Subcutaneous edema in the foot is observed and cellulitis is not excluded. Electronically Signed   By: Van Clines M.D.   On: 05/19/2021  10:04   Medications:  sodium chloride 250 mL (05/20/21 2352)   ceFEPime (MAXIPIME) IV 1 g (05/20/21 2354)   lactated ringers Stopped (05/19/21 0019)   Vancomycin      acetaminophen  500 mg Oral Q6H   aspirin  81 mg Oral Daily   calcium acetate  667 mg Oral TID WC   clopidogrel  75 mg Oral Daily   dextrose       dextrose       heparin  5,000 Units Subcutaneous Q8H   hydrALAZINE  50 mg Oral Q8H   multivitamin  1 tablet Oral QHS   polyethylene glycol  17 g Oral BID   senna  1 tablet Oral BID    I have seen and examined this patient and agree with plan and assessment in the above note with renal recommendations/intervention highlighted. Continue with MWF schedule while he remains an inpatient.  Governor Rooks Keldrick Pomplun,MD 05/21/2021 3:44 PM

## 2021-05-21 NOTE — Evaluation (Signed)
Clinical/Bedside Swallow Evaluation Patient Details  Name: Roger Vazquez MRN: [id number] Date of Birth: Jul 25, 1951  Today's Date: 05/21/2021 Time: SLP Start Time (ACUTE ONLY): 69 SLP Stop Time (ACUTE ONLY): 1035 SLP Time Calculation (min) (ACUTE ONLY): 15 min  Past Medical History:  Past Medical History:  Diagnosis Date   Cancer ([place])    Lung   Diabetes mellitus    Hypertension    Renal disorder    MWF- [facility]   Past Surgical History:  Past Surgical History:  Procedure Laterality Date   ABDOMINAL AORTOGRAM W/LOWER EXTREMITY Left 04/28/2021   Procedure: ABDOMINAL AORTOGRAM W/LOWER EXTREMITY;  Surgeon: Cherre Robins, MD;  Location: [facility];  Service: Cardiovascular;  Laterality: Left;   AMPUTATION Left 05/04/2021   Procedure: LEFT SECOND TOE AMPUTATION;  Surgeon: Cherre Robins, MD;  Location: [facility];  Service: Vascular;  Laterality: Left;   artiovenous graft placement Left    AV FISTULA PLACEMENT Right 11/27/2018   Procedure: INSERTION 4-7MM X 45CM GORETEX GRAFT RIGHT FOREARM ;  Surgeon: Rosetta Posner, MD;  Location: [facility];  Service: Vascular;  Laterality: Right;   [place] REMOVAL Left 08/06/2018   Procedure: REMOVAL OF LEFT ARM GRAFT;  Surgeon: Rosetta Posner, MD;  Location: [facility];  Service: Vascular;  Laterality: Left;   INSERTION OF DIALYSIS CATHETER Right 08/08/2018   Procedure: INSERTION OF TUNNELED  DIALYSIS CATHETER;  Surgeon: Marty Heck, MD;  Location: [facility];  Service: Vascular;  Laterality: Right;   IR REMOVAL TUN CV CATH W/O FL  02/19/2019   LUNG SURGERY     patient not if they removed part of lung. Was done at [facility] Left 04/28/2021   Procedure: PERIPHERAL VASCULAR BALLOON ANGIOPLASTY;  Surgeon: Cherre Robins, MD;  Location: [facility] [facility];  Service: Cardiovascular;  Laterality: Left;  popliteal and posterial tibial   TEE WITHOUT CARDIOVERSION N/A 07/31/2018   Procedure: TRANSESOPHAGEAL  ECHOCARDIOGRAM (TEE);  Surgeon: Pixie Casino, MD;  Location[facility];  Service: Cardiovascular;  Laterality: N/A;   HPI:  Roger Vazquez is a 70 y.o. male who presented with altered mental status and worsening left foot gangrene. PMHx significant for ESRD on HD MWF, hx of lung cancer, PAD, HTN, T2DM, and polysubstance abuse. Pt with waxing and waning of mentation, consulted for swallowing assessment at bedside.   Assessment / Plan / Recommendation Clinical Impression  Pts swallow appeared grossly functional. Mild oral deficits exhibited in setting of edentulous status (pt states he has dentures at home; typically utilizing upper and lower with POs). Prolonged mastication noted with solids, with extended time pt able to clear oral cavity. No overt s/sx of aspiration exhibited with any PO. Per RN pt did well with breakfast meal. Pt has had fluctuation in mentation since admission with hx of dementia and some variance with blood sugars. Continue regular thin liquid diet with full supervision with POs from staff. No further ST needs identified.  SLP Visit Diagnosis: Dysphagia, oral phase (R13.11);Dysphagia, unspecified (R13.10)    Aspiration Risk  Mild aspiration risk    Diet Recommendation Regular, thin liquids     Medication Administration: Whole meds with liquid    Other  Recommendations Oral Care Recommendations: Oral care BID   Follow up Recommendations 24 hour supervision/assistance      Frequency and Duration   N/a         Prognosis        Swallow Study  General Date of Onset: 05/19/21 HPI: Roger Vazquez is a 70 y.o. male who presented with altered mental status and worsening left foot gangrene. PMHx significant for ESRD on HD MWF, hx of lung cancer, PAD, HTN, T2DM, and polysubstance abuse. Pt with waxing and waning of mentation, consulted for swallowing assessment at bedside. Type of Study: Bedside Swallow Evaluation Previous Swallow Assessment: none on  file Diet Prior to this Study: Regular;Thin liquids Temperature Spikes Noted: No Respiratory Status: Room air History of Recent Intubation: No Behavior/Cognition: Alert;Requires cueing;Cooperative Oral Cavity Assessment: Within Functional Limits Oral Care Completed by SLP: No Oral Cavity - Dentition: Dentures, not available;Edentulous Vision: Functional for self-feeding Self-Feeding Abilities: Needs assist Patient Positioning: Upright in bed Baseline Vocal Quality: Hoarse Volitional Cough: Cognitively unable to elicit Volitional Swallow: Unable to elicit    Oral/Motor/Sensory Function Overall Oral Motor/Sensory Function: Generalized oral weakness   Ice Chips Ice chips: Impaired Presentation: Spoon Oral Phase Impairments: Impaired mastication Oral Phase Functional Implications: Prolonged oral transit Pharyngeal Phase Impairments: Multiple swallows   Thin Liquid Thin Liquid: Impaired Presentation: Cup;Straw Oral Phase Impairments: Reduced lingual movement/coordination Oral Phase Functional Implications: Prolonged oral transit Pharyngeal  Phase Impairments: Suspected delayed Swallow;Multiple swallows    Nectar Thick Nectar Thick Liquid: Not tested   Honey Thick Honey Thick Liquid: Not tested   Puree Puree: Within functional limits   Solid     Solid: Impaired Presentation: Spoon Oral Phase Impairments: Impaired mastication;Reduced lingual movement/coordination Oral Phase Functional Implications: Prolonged oral transit;Impaired mastication Pharyngeal Phase Impairments: Multiple swallows      Roger Burdette H. MA, CCC-SLP Acute Rehabilitation Services   05/21/2021,10:45 AM

## 2021-05-21 NOTE — Progress Notes (Signed)
Rechecked patient's blood sugar and it is now 184.  Patient is moaning in pain at this time.  MD notified.  Patient is a little more alert this morning.  Will continue to follow.  Donah Driver, RN

## 2021-05-21 NOTE — Progress Notes (Addendum)
Patient's blood sugar is 92 at this time.  MD at the bedside,  and made aware.  Will continue to monitor.   Donah Driver, RN

## 2021-05-22 ENCOUNTER — Encounter (HOSPITAL_COMMUNITY): Payer: Self-pay | Admitting: Student

## 2021-05-22 ENCOUNTER — Inpatient Hospital Stay (HOSPITAL_COMMUNITY): Payer: No Typology Code available for payment source | Admitting: Certified Registered"

## 2021-05-22 ENCOUNTER — Encounter (HOSPITAL_COMMUNITY)
Admission: EM | Disposition: A | Discharge status: Skilled Nursing Facility | Payer: Self-pay | Source: Home / Self Care | Attending: Family Medicine

## 2021-05-22 DIAGNOSIS — R4182 Altered mental status, unspecified: Secondary | ICD-10-CM | POA: Diagnosis not present

## 2021-05-22 DIAGNOSIS — I96 Gangrene, not elsewhere classified: Secondary | ICD-10-CM

## 2021-05-22 DIAGNOSIS — L089 Local infection of the skin and subcutaneous tissue, unspecified: Secondary | ICD-10-CM | POA: Diagnosis not present

## 2021-05-22 HISTORY — PX: TRANSMETATARSAL AMPUTATION: SHX6197

## 2021-05-22 LAB — CBC
HCT: 30.8 % — ABNORMAL LOW (ref 39.0–52.0)
Hemoglobin: 10.2 g/dL — ABNORMAL LOW (ref 13.0–17.0)
MCH: 34 pg (ref 26.0–34.0)
MCHC: 33.1 g/dL (ref 30.0–36.0)
MCV: 102.7 fL — ABNORMAL HIGH (ref 80.0–100.0)
Platelets: 262 10*3/uL (ref 150–400)
RBC: 3 MIL/uL — ABNORMAL LOW (ref 4.22–5.81)
RDW: 15 % (ref 11.5–15.5)
WBC: 13.6 10*3/uL — ABNORMAL HIGH (ref 4.0–10.5)
nRBC: 0 % (ref 0.0–0.2)

## 2021-05-22 LAB — GLUCOSE, CAPILLARY
Glucose-Capillary: 110 mg/dL — ABNORMAL HIGH (ref 70–99)
Glucose-Capillary: 142 mg/dL — ABNORMAL HIGH (ref 70–99)
Glucose-Capillary: 172 mg/dL — ABNORMAL HIGH (ref 70–99)
Glucose-Capillary: 35 mg/dL — CL (ref 70–99)
Glucose-Capillary: 143 mg/dL — ABNORMAL HIGH (ref 70–99)
Glucose-Capillary: 119 mg/dL — ABNORMAL HIGH (ref 70–99)
Glucose-Capillary: 167 mg/dL — ABNORMAL HIGH (ref 70–99)
Glucose-Capillary: 227 mg/dL — ABNORMAL HIGH (ref 70–99)

## 2021-05-22 LAB — RENAL FUNCTION PANEL
Albumin: 2.6 g/dL — ABNORMAL LOW (ref 3.5–5.0)
Anion gap: 15 (ref 5–15)
BUN: 44 mg/dL — ABNORMAL HIGH (ref 8–23)
CO2: 21 mmol/L — ABNORMAL LOW (ref 22–32)
Calcium: 9 mg/dL (ref 8.9–10.3)
Chloride: 97 mmol/L — ABNORMAL LOW (ref 98–111)
Creatinine, Ser: 9.47 mg/dL — ABNORMAL HIGH (ref 0.61–1.24)
GFR, Estimated: 5 mL/min — ABNORMAL LOW (ref >60)
Glucose, Bld: 109 mg/dL — ABNORMAL HIGH (ref 70–99)
Phosphorus: 4 mg/dL (ref 2.5–4.6)
Potassium: 3.7 mmol/L (ref 3.5–5.1)
Sodium: 133 mmol/L — ABNORMAL LOW (ref 135–145)

## 2021-05-22 SURGERY — AMPUTATION, FOOT, TRANSMETATARSAL
Anesthesia: General | Site: Toe | Laterality: Left

## 2021-05-22 MED ORDER — FENTANYL CITRATE (PF) 100 MCG/2ML IJ SOLN
INTRAMUSCULAR | Status: AC
  Administered 2021-05-22: 25 ug via INTRAVENOUS
  Filled 2021-05-22: qty 2

## 2021-05-22 MED ORDER — 0.9 % SODIUM CHLORIDE (POUR BTL) OPTIME
TOPICAL | Status: DC | PRN
  Administered 2021-05-22: 1000 mL

## 2021-05-22 MED ORDER — DEXAMETHASONE SODIUM PHOSPHATE 10 MG/ML IJ SOLN
INTRAMUSCULAR | Status: DC | PRN
  Administered 2021-05-22: 4 mg via INTRAVENOUS

## 2021-05-22 MED ORDER — POLYETHYLENE GLYCOL 3350 17 G PO PACK
17.0000 g | PACK | Freq: Three times a day (TID) | ORAL | Status: DC
  Administered 2021-05-22: 17 g via ORAL
  Filled 2021-05-22 (×3): qty 1

## 2021-05-22 MED ORDER — PROSOURCE PLUS PO LIQD
30.0000 mL | Freq: Two times a day (BID) | ORAL | Status: DC
  Administered 2021-05-22 – 2021-05-24 (×4): 30 mL via ORAL
  Filled 2021-05-22 (×7): qty 30

## 2021-05-22 MED ORDER — DOXERCALCIFEROL 4 MCG/2ML IV SOLN
1.0000 ug | INTRAVENOUS | Status: DC
  Administered 2021-05-22 – 2021-05-31 (×3): 1 ug via INTRAVENOUS
  Filled 2021-05-22 (×4): qty 2

## 2021-05-22 MED ORDER — ONDANSETRON HCL 4 MG/2ML IJ SOLN
INTRAMUSCULAR | Status: DC | PRN
  Administered 2021-05-22: 4 mg via INTRAVENOUS

## 2021-05-22 MED ORDER — DEXAMETHASONE SODIUM PHOSPHATE 10 MG/ML IJ SOLN
INTRAMUSCULAR | Status: AC
  Filled 2021-05-22: qty 1

## 2021-05-22 MED ORDER — CHLORHEXIDINE GLUCONATE 0.12 % MT SOLN
15.0000 mL | Freq: Once | OROMUCOSAL | Status: AC
  Administered 2021-05-22: 15 mL via OROMUCOSAL

## 2021-05-22 MED ORDER — PROPOFOL 10 MG/ML IV BOLUS
INTRAVENOUS | Status: AC
  Filled 2021-05-22: qty 20

## 2021-05-22 MED ORDER — ONDANSETRON HCL 4 MG/2ML IJ SOLN
4.0000 mg | Freq: Four times a day (QID) | INTRAMUSCULAR | Status: DC | PRN
  Administered 2021-05-30: 4 mg via INTRAVENOUS
  Filled 2021-05-22 (×2): qty 2

## 2021-05-22 MED ORDER — FENTANYL CITRATE (PF) 100 MCG/2ML IJ SOLN
25.0000 ug | INTRAMUSCULAR | Status: DC | PRN
  Administered 2021-05-22: 50 ug via INTRAVENOUS
  Administered 2021-05-22: 25 ug via INTRAVENOUS

## 2021-05-22 MED ORDER — SODIUM CHLORIDE 0.9 % IV SOLN: INTRAVENOUS | Status: DC | PRN

## 2021-05-22 MED ORDER — CEFAZOLIN SODIUM 1 G IJ SOLR
INTRAMUSCULAR | Status: AC
  Filled 2021-05-22: qty 20

## 2021-05-22 MED ORDER — ONDANSETRON HCL 4 MG/2ML IJ SOLN
INTRAMUSCULAR | Status: AC
  Filled 2021-05-22: qty 2

## 2021-05-22 MED ORDER — GLUCERNA SHAKE PO LIQD
237.0000 mL | Freq: Three times a day (TID) | ORAL | Status: DC
  Administered 2021-05-22 – 2021-05-25 (×6): 237 mL via ORAL

## 2021-05-22 MED ORDER — LIDOCAINE 2% (20 MG/ML) 5 ML SYRINGE
INTRAMUSCULAR | Status: AC
  Filled 2021-05-22: qty 5

## 2021-05-22 MED ORDER — PHENYLEPHRINE 40 MCG/ML (10ML) SYRINGE FOR IV PUSH (FOR BLOOD PRESSURE SUPPORT)
PREFILLED_SYRINGE | INTRAVENOUS | Status: DC | PRN
Start: 2021-05-22 — End: 2021-05-22
  Administered 2021-05-22 (×2): 120 ug via INTRAVENOUS
  Administered 2021-05-22: 80 ug via INTRAVENOUS
  Administered 2021-05-22: 120 ug via INTRAVENOUS

## 2021-05-22 MED ORDER — PHENYLEPHRINE 40 MCG/ML (10ML) SYRINGE FOR IV PUSH (FOR BLOOD PRESSURE SUPPORT)
PREFILLED_SYRINGE | INTRAVENOUS | Status: AC
  Filled 2021-05-22: qty 20

## 2021-05-22 MED ORDER — LIDOCAINE 2% (20 MG/ML) 5 ML SYRINGE
INTRAMUSCULAR | Status: DC | PRN
  Administered 2021-05-22: 100 mg via INTRAVENOUS

## 2021-05-22 MED ORDER — JUVEN PO PACK
1.0000 | PACK | Freq: Two times a day (BID) | ORAL | Status: DC
  Administered 2021-05-22 – 2021-05-24 (×3): 1 via ORAL
  Filled 2021-05-22 (×3): qty 1

## 2021-05-22 MED ORDER — LACTATED RINGERS IV SOLN: INTRAVENOUS | Status: DC

## 2021-05-22 MED ORDER — FENTANYL CITRATE (PF) 250 MCG/5ML IJ SOLN
INTRAMUSCULAR | Status: DC | PRN
  Administered 2021-05-22: 25 ug via INTRAVENOUS
  Administered 2021-05-22: 50 ug via INTRAVENOUS

## 2021-05-22 MED ORDER — PROPOFOL 10 MG/ML IV BOLUS
INTRAVENOUS | Status: DC | PRN
  Administered 2021-05-22: 150 mg via INTRAVENOUS

## 2021-05-22 MED ORDER — PHENYLEPHRINE 40 MCG/ML (10ML) SYRINGE FOR IV PUSH (FOR BLOOD PRESSURE SUPPORT)
PREFILLED_SYRINGE | INTRAVENOUS | Status: AC
  Filled 2021-05-22: qty 10

## 2021-05-22 MED ORDER — SENNA 8.6 MG PO TABS
2.0000 | ORAL_TABLET | Freq: Every day | ORAL | Status: DC
  Administered 2021-05-23: 17.2 mg via ORAL
  Filled 2021-05-22 (×2): qty 2

## 2021-05-22 MED ORDER — FENTANYL CITRATE (PF) 250 MCG/5ML IJ SOLN
INTRAMUSCULAR | Status: AC
  Filled 2021-05-22: qty 5

## 2021-05-22 MED ORDER — ORAL CARE MOUTH RINSE: 15.0000 mL | Freq: Once | OROMUCOSAL | Status: AC

## 2021-05-22 MED ORDER — METOPROLOL TARTRATE 5 MG/5ML IV SOLN
2.0000 mg | INTRAVENOUS | Status: DC | PRN
  Filled 2021-05-22: qty 5

## 2021-05-22 SURGICAL SUPPLY — 37 items
BAG COUNTER SPONGE SURGICOUNT (BAG) ×2 IMPLANT
BAG SPNG CNTER NS LX DISP (BAG) ×1
BLADE LONG MED 31X9 (MISCELLANEOUS) ×1 IMPLANT
BNDG ELASTIC 4X5.8 VLCR STR LF (GAUZE/BANDAGES/DRESSINGS) ×2 IMPLANT
BNDG ELASTIC 6X5.8 VLCR STR LF (GAUZE/BANDAGES/DRESSINGS) ×1 IMPLANT
BNDG GAUZE ELAST 4 BULKY (GAUZE/BANDAGES/DRESSINGS) ×2 IMPLANT
CANISTER SUCT 3000ML PPV (MISCELLANEOUS) ×2 IMPLANT
COVER SURGICAL LIGHT HANDLE (MISCELLANEOUS) ×3 IMPLANT
DRAPE EXTREMITY T 121X128X90 (DISPOSABLE) ×1 IMPLANT
DRAPE HALF SHEET 40X57 (DRAPES) ×2 IMPLANT
DRSG ADAPTIC 3X8 NADH LF (GAUZE/BANDAGES/DRESSINGS) ×1 IMPLANT
DRSG EMULSION OIL 3X3 NADH (GAUZE/BANDAGES/DRESSINGS) ×2 IMPLANT
ELECT REM PT RETURN 9FT ADLT (ELECTROSURGICAL) ×2
ELECTRODE REM PT RTRN 9FT ADLT (ELECTROSURGICAL) ×1 IMPLANT
GAUZE 4X4 16PLY ~~LOC~~+RFID DBL (SPONGE) ×1 IMPLANT
GAUZE SPONGE 4X4 12PLY STRL (GAUZE/BANDAGES/DRESSINGS) ×2 IMPLANT
GLOVE SURG ENC MOIS LTX SZ6.5 (GLOVE) ×2 IMPLANT
GLOVE SURG ENC MOIS LTX SZ7.5 (GLOVE) ×2 IMPLANT
GLOVE SURG UNDER POLY LF SZ6 (GLOVE) ×1 IMPLANT
GLOVE SURG UNDER POLY LF SZ6.5 (GLOVE) ×1 IMPLANT
GOWN STRL REUS W/ TWL LRG LVL3 (GOWN DISPOSABLE) ×3 IMPLANT
GOWN STRL REUS W/TWL LRG LVL3 (GOWN DISPOSABLE) ×8
KIT BASIN OR (CUSTOM PROCEDURE TRAY) ×2 IMPLANT
KIT TURNOVER KIT B (KITS) ×2 IMPLANT
NS IRRIG 1000ML POUR BTL (IV SOLUTION) ×2 IMPLANT
PACK GENERAL/GYN (CUSTOM PROCEDURE TRAY) ×2 IMPLANT
PAD ARMBOARD 7.5X6 YLW CONV (MISCELLANEOUS) ×4 IMPLANT
SPONGE T-LAP 18X18 ~~LOC~~+RFID (SPONGE) ×1 IMPLANT
STAPLER VISISTAT 35W (STAPLE) ×1 IMPLANT
SUT ETHILON 3 0 PS 1 (SUTURE) ×2 IMPLANT
SUT VIC AB 3-0 SH 18 (SUTURE) IMPLANT
SUT VIC AB 3-0 SH 27 (SUTURE) ×4
SUT VIC AB 3-0 SH 27X BRD (SUTURE) ×1 IMPLANT
TOWEL GREEN STERILE (TOWEL DISPOSABLE) ×4 IMPLANT
TOWEL GREEN STERILE FF (TOWEL DISPOSABLE) ×2 IMPLANT
UNDERPAD 30X36 HEAVY ABSORB (UNDERPADS AND DIAPERS) ×2 IMPLANT
WATER STERILE IRR 1000ML POUR (IV SOLUTION) ×2 IMPLANT

## 2021-05-22 NOTE — Anesthesia Postprocedure Evaluation (Signed)
Anesthesia Post Note  Patient: ARNAV CREGG  Procedure(s) Performed: LEFT TRANSMETATARSAL AMPUTATION (Left: Toe)     Patient location during evaluation: PACU Anesthesia Type: General Level of consciousness: awake Pain management: pain level controlled Vital Signs Assessment: post-procedure vital signs reviewed and stable Respiratory status: spontaneous breathing Cardiovascular status: stable Postop Assessment: no apparent nausea or vomiting Anesthetic complications: no   No notable events documented.  Last Vitals:  Vitals:   05/22/21 1550 05/22/21 1619  BP: (!) 147/67   Pulse:    Resp:    Temp: 36.6 C 37.2 C  SpO2:  100%    Last Pain:  Vitals:   05/22/21 1619  TempSrc: Oral  PainSc: 0-No pain                 Kyle Stansell

## 2021-05-22 NOTE — Plan of Care (Signed)

## 2021-05-22 NOTE — Interval H&P Note (Signed)
History and Physical Interval Note:  05/22/2021 1:02 PM  Roger Vazquez  has presented today for surgery, with the diagnosis of GANGRENE.  The various methods of treatment have been discussed with the patient and family. After consideration of risks, benefits and other options for treatment, the patient has consented to  Procedure(s): LEFT TRANSMETATARSAL AMPUTATION (Left) as a surgical intervention.  The patient's history has been reviewed, patient examined, no change in status, stable for surgery.  I have reviewed the patient's chart and labs.  Questions were answered to the patient's satisfaction.     Ruta Hinds

## 2021-05-22 NOTE — Op Note (Signed)
Procedure: Left transmetatarsal amputation  Preoperative diagnosis: Gangrene left foot  Postoperative diagnosis: Same  Anesthesia: General  Assistant: Nurse  Operative findings: Necrotic bone and soft tissue  Operative details: After team informed consent, the patient was taken the operating.  The patient was placed in supine position operating table.  After induction of general anesthesia patient's left foot was prepped and draped in usual sterile fashion.  A transverse incision was made on the dorsum of the foot over the metatarsal bones.  Incision was carried all the way down the level of bone and then extended longitudinally to create a posterior flap.  The saw was used to transect all the metatarsal bones.  These were then debrided back to healthy tissue.  Several of the metatarsal bones were severely necrotic and this required debridement all the way back to the proximal aspect of the tarsal bones.  The wound was thoroughly irrigated with normal saline solution.  Hemostasis was obtained.  Subcutaneous tissues were reapproximated using a running 3-0 Vicryl suture.  The skin was reapproximated with staples.  The patient tolerated the procedure well and there were no complications.  The instrument sponge and needle count was correct at the end of the case.  The patient was taken the recovery room in stable condition.  Ruta Hinds, MD [facility] [place] Office: [phone]

## 2021-05-22 NOTE — Anesthesia Preprocedure Evaluation (Signed)
Anesthesia Evaluation  Patient identified by MRN, date of birth, ID band Patient awake    Reviewed: Allergy & Precautions, NPO status , Patient's Chart, lab work & pertinent test results  Airway Mallampati: II  TM Distance: >3 FB     Dental   Pulmonary shortness of breath, former smoker,    breath sounds clear to auscultation       Cardiovascular hypertension,  Rhythm:Regular Rate:Normal     Neuro/Psych  Headaches,    GI/Hepatic GERD  ,(+) Hepatitis -  Endo/Other  diabetes  Renal/GU Renal disease     Musculoskeletal   Abdominal   Peds  Hematology   Anesthesia Other Findings   Reproductive/Obstetrics                             Anesthesia Physical Anesthesia Plan  ASA: 3  Anesthesia Plan: General   Post-op Pain Management:    Induction: Intravenous  PONV Risk Score and Plan: 2 and Ondansetron and Dexamethasone  Airway Management Planned: LMA  Additional Equipment:   Intra-op Plan:   Post-operative Plan: Extubation in OR  Informed Consent: I have reviewed the patients History and Physical, chart, labs and discussed the procedure including the risks, benefits and alternatives for the proposed anesthesia with the patient or authorized representative who has indicated his/her understanding and acceptance.     Dental advisory given  Plan Discussed with: CRNA and Anesthesiologist  Anesthesia Plan Comments:         Anesthesia Quick Evaluation

## 2021-05-22 NOTE — Progress Notes (Signed)
I spoke with the patient's Sister Ganon Demasi today.  She has given consent for a left transmetatarsal amputation.  She understands the primary risk and this is the it may not heal.  I told her there was a 50% chance it may not heal and he would potentially require below-knee amputation later.  She understands these risks.  We will proceed with his transmetatarsal amputation a little bit later today.  Ruta Hinds, MD [facility] [place] Office: [phone]

## 2021-05-22 NOTE — Transfer of Care (Signed)
Immediate Anesthesia Transfer of Care Note  Patient: Roger Vazquez  Procedure(s) Performed: LEFT TRANSMETATARSAL AMPUTATION (Left: Toe)  Patient Location: PACU  Anesthesia Type:General  Level of Consciousness: drowsy and patient cooperative  Airway & Oxygen Therapy: Patient Spontanous Breathing and Patient connected to face mask oxygen  Post-op Assessment: Report given to RN and Post -op Vital signs reviewed and stable  Post vital signs: Reviewed and stable  Last Vitals:  Vitals Value Taken Time  BP 131/59 05/22/21 1441  Temp    Pulse 91 05/22/21 1444  Resp 17 05/22/21 1444  SpO2 100 % 05/22/21 1444  Vitals shown include unvalidated device data.  Last Pain:  Vitals:   05/22/21 1230  TempSrc: Oral  PainSc:       Patients Stated Pain Goal: 2 (64/84/72 0721)  Complications: No notable events documented.

## 2021-05-22 NOTE — Progress Notes (Signed)
Pt arrived to HD unit from room 6E01  During treatment, pt shivering uncontrollably, pressures reading high, heart rate 130's, irregularity and lack of pwaves noted. Provider ordered bolus of NS, afterwards, heart rate stabilized, EKG read sinus tach with pacs, and pt given vanco shortly thereafter.  Received 1550 mL fluid intratreatment, total UF @ end 1450. +100  OR called as pt being deaccessed, and he was picked up shortly thereafter.  VSS on room air

## 2021-05-22 NOTE — Anesthesia Procedure Notes (Signed)
Procedure Name: LMA Insertion Date/Time: 05/22/2021 1:38 PM Performed by: Barrington Ellison, CRNA Pre-anesthesia Checklist: Patient identified, Emergency Drugs available, Suction available and Patient being monitored Patient Re-evaluated:Patient Re-evaluated prior to induction Oxygen Delivery Method: Circle System Utilized Preoxygenation: Pre-oxygenation with 100% oxygen Induction Type: IV induction Ventilation: Mask ventilation without difficulty LMA: LMA inserted LMA Size: 4.0 Number of attempts: 1 Placement Confirmation: positive ETCO2 Tube secured with: Tape Dental Injury: Teeth and Oropharynx as per pre-operative assessment

## 2021-05-22 NOTE — Progress Notes (Signed)
FPTS Interim Night Progress Note  S:Patient sleeping comfortably.  Rounded with primary night RN.  No concerns voiced.  No orders required.    O: Today's Vitals   05/21/21 2025 05/22/21 0200 05/22/21 0238 05/22/21 0338  BP: 124/62  (!) 172/68   Pulse: 93  90   Resp: 18     Temp: 98.4 F (36.9 C)     TempSrc: Oral     SpO2: 99%     Weight:      Height:      PainSc:  6   Asleep      A/P: Continue current management Follow up with consent for surgery, sister has questions about surgery she would like to speak to surgeon prior to surgery.    Carollee Leitz MD PGY-3, [facility] Family Medicine Service pager [phone]

## 2021-05-22 NOTE — Plan of Care (Signed)
  Problem: Pain Management: Goal: Pain level will decrease with appropriate interventions Outcome: Progressing   

## 2021-05-22 NOTE — Progress Notes (Addendum)
Family Medicine Teaching Service Daily Progress Note Intern Pager: [phone]  Patient name: Roger Vazquez Medical record number: [id number] Date of birth: 01-Jul-1951 Age: 70 y.o. Gender: male  Primary Care Provider: Clinic, [facility] Consultants: Vascular surgery, Code Status: Full  Pt Overview and Major Events to Date:  8/5- admitted 8/6- recurrent hypoglycemia 8/8- OR for transmetatarsal amputation  Assessment and Plan: Roger Vazquez is a 70 year old male who presented with altered mental status and worsening left foot gangrene.  His history is significant for ESRD on HD MWF, history of lung cancer, PAD, HTN, diabetes, polysubstance abuse.  Worsening Gangrene of L Foot Patient is scheduled for OR with [facility]VS this morning for transmetatarsal amputation. Pain is well-controlled on current regimen.  -Surgery today -Continue Vanc and cefepime (day 4) -Postop antibiotics per vascular surgery -Continue Tylenol 500mg  q6h -Continue Oxycodone 5mg  q6h PRN for breakthrough pain -We will need PT OT after surgery  Altered Mental Status  Hx of Polysubstance Abuse No further episodes of agitation or uncooperative behavior.  Affect flat today but answers questions appropriately.  Likely that his abnormal behavior represented some combination of sepsis and delirium.  Last CIWA 5.  Has not required any Ativan since 8/6. -Continue delirium precautions -CIWA with ativan  ESRD on HD Nephro planning for HD today, will schedule around surgery. -Dialysis per nephro -Continue renal vitamin and PhosLo  Constipation Does not know when last BM was. Endorses abdominal pain and distention. This was an issue for him last hospitalization as well. Already on MiraLAX and Senna BID -Will adjust Senna to two tablets once daily -Increase MiraLAX to TID  -If no improvement, will switch to lactulose  HTN Last BP 158/68.  -Continue home hydralazine 50 mg every 8 hours  FEN/GI: NPO for surgery  today  PPx: Heparin Dispo:Pending PT recommendations  pending clinical improvement . Barriers include surgery today.   Subjective:  [name] reports that he feels okay today.  Says his pain is well controlled.  Does not recall when his last bowel movement was.  Objective: Temp:  [97.5 F (36.4 C)-99.1 F (37.3 C)] 97.5 F (36.4 C) (08/08 0805) Pulse Rate:  [76-107] 107 (08/08 0900) Resp:  [14-22] 22 (08/08 0910) BP: (114-195)/(59-167) 141/71 (08/08 0900) SpO2:  [98 %-100 %] 98 % (08/08 0805) Weight:  [54.8 kg-59.7 kg] 54.8 kg (08/08 0805) Physical Exam: General: Comfortable appearing, lying in bed Cardiovascular: Irregular, transmitted fistula sounds Respiratory: Normal work of breathing on room air, lung fields clear to auscultation anteriorly Abdomen: Mildly distended, diffusely tender to palpation Extremities: Left lower extremity with dressing in place, no streaking erythema or edema extending proximally Neuro: Alert and oriented, moves all extremities independently, answers questions appropriately Psych: Flat affect  Laboratory: Recent Labs  Lab 05/20/21 0220 05/21/21 0135 05/22/21 0117  WBC 15.0* 16.0* 13.6*  HGB 9.1* 9.8* 10.2*  HCT 27.1* 29.3* 30.8*  PLT 261 271 262   Recent Labs  Lab 05/18/21 2306 05/19/21 1741 05/20/21 0220 05/21/21 0135 05/22/21 0117  NA 135   < > 138 136 133*  K 4.1   < > 4.0 3.4* 3.7  CL 96*   < > 99 99 97*  CO2 22   < > 22 23 21*  BUN 56*   < > 65* 38* 44*  CREATININE 10.69*   < > 11.94* 8.29* 9.47*  CALCIUM 9.2   < > 8.8* 8.8* 9.0  PROT 8.7*  --   --   --   --  BILITOT 1.1  --   --   --   --   ALKPHOS 86  --   --   --   --   ALT 15  --   --   --   --   AST 42*  --   --   --   --   GLUCOSE 161*   < > 90 67* 109*   < > = values in this interval not displayed.     Imaging/Diagnostic Tests: No new imaging, tests  Eppie Gibson, MD 05/22/2021, 9:15 AM PGY-1, [place] Intern pager: [phone], text  pages welcome

## 2021-05-22 NOTE — Progress Notes (Addendum)
[facility] Progress Note   Subjective: Seen on HD. HR in 130s. Afib. Getting 12 lead EKG.  Appears dry. Out of UF, given 500cc NS. No C/Os.   Objective Vitals:   05/21/21 2025 05/22/21 0238 05/22/21 0531 05/22/21 0739  BP: 124/62 (!) 172/68 (!) 158/68 (!) 149/72  Pulse:  90 77 93  Resp: 18   16  Temp: 98.4 F (36.9 C)  98 F (36.7 C) 98 F (36.7 C)  TempSrc: Oral  Oral Axillary  SpO2: 99%  100% 100%  Weight:   59.7 kg   Height:       Physical Exam General: Pleasantly confused elderly male in NAD Neuro: Oriented to person & place. Cooperative, FCs.  Heart:S1,S2  irreg Tachy. ST/AFIB on monitor. 2/6 systolic M.  Lungs: CTAB No WOB Abdomen: NABS Extremities: Drsg intact L foot. No edema.  Dialysis Access: AVG cannulated    Additional Objective Labs: Basic Metabolic Panel: Recent Labs  Lab 05/20/21 0220 05/21/21 0135 05/22/21 0117  NA 138 136 133*  K 4.0 3.4* 3.7  CL 99 99 97*  CO2 22 23 21*  GLUCOSE 90 67* 109*  BUN 65* 38* 44*  CREATININE 11.94* 8.29* 9.47*  CALCIUM 8.8* 8.8* 9.0  PHOS 5.3* 3.9 4.0   Liver Function Tests: Recent Labs  Lab 05/18/21 2306 05/19/21 1741 05/20/21 0220 05/21/21 0135 05/22/21 0117  AST 42*  --   --   --   --   ALT 15  --   --   --   --   ALKPHOS 86  --   --   --   --   BILITOT 1.1  --   --   --   --   PROT 8.7*  --   --   --   --   ALBUMIN 3.5   < > 2.8* 2.8* 2.6*   < > = values in this interval not displayed.   No results for input(s): LIPASE, AMYLASE in the last 168 hours. CBC: Recent Labs  Lab 05/18/21 2306 05/19/21 0739 05/20/21 0220 05/21/21 0135 05/22/21 0117  WBC 18.9* 16.3* 15.0* 16.0* 13.6*  NEUTROABS 16.2*  --   --   --   --   HGB 11.0* 9.7* 9.1* 9.8* 10.2*  HCT 32.3* 28.5* 27.1* 29.3* 30.8*  MCV 103.5* 104.8* 103.8* 102.1* 102.7*  PLT 293 247 261 271 262   Blood Culture    Component Value Date/Time   SDES BLOOD LEFT FOREARM 05/18/2021 1955   SDES BLOOD LEFT HAND 05/18/2021 1955    SPECREQUEST  05/18/2021 1955    BOTTLES DRAWN AEROBIC AND ANAEROBIC Blood Culture adequate volume   SPECREQUEST  05/18/2021 1955    BOTTLES DRAWN AEROBIC AND ANAEROBIC Blood Culture adequate volume   CULT  05/18/2021 1955    NO GROWTH 2 DAYS Performed at [facility], [place] [address] [place], [place] [address]    CULT  05/18/2021 1955    NO GROWTH 2 DAYS Performed at [facility] [address], [place], [place] [address]    REPTSTATUS PENDING 05/18/2021 1955   REPTSTATUS PENDING 05/18/2021 1955    Cardiac Enzymes: Recent Labs  Lab 05/19/21 0557  CKTOTAL 430*   CBG: Recent Labs  Lab 05/21/21 1627 05/21/21 2024 05/22/21 0014 05/22/21 0526 05/22/21 0737  GLUCAP 116* 101* 110* 142* 172*   Iron Studies: No results for input(s): IRON, TIBC, TRANSFERRIN, FERRITIN in the last 72 hours. @lablastinr3 @ Studies/Results: No results found.  Medications:  sodium chloride 250 mL (05/20/21 2352)   ceFEPime (MAXIPIME) IV 1 g (05/21/21 2115)   dextrose 10 % and 0.45 % NaCl 25 mL/hr at 05/22/21 0735   lactated ringers Stopped (05/19/21 0019)   Vancomycin      acetaminophen  500 mg Oral Q6H   aspirin  81 mg Oral Daily   calcium acetate  667 mg Oral TID WC   clopidogrel  75 mg Oral Daily   [START ON 05/26/2021] darbepoetin (ARANESP) injection - DIALYSIS  25 mcg Intravenous Q Fri-HD   doxercalciferol  2 mcg Intravenous Q M,W,F-HD   heparin  5,000 Units Subcutaneous Q8H   hydrALAZINE  50 mg Oral Q8H   multivitamin  1 tablet Oral QHS   polyethylene glycol  17 g Oral BID   senna  1 tablet Oral BID   Dialysis Orders: [facility] MWF 3hr, 400/Autoflow 1.5, 60.kg, 2K/3Ca, AVG,  --Heparin 2,000 units IV TIW - Hectoral 42mcg IV q HD - Mircera 62mcg IV q 2 weeks - last 7/13 -Venofer 100 q. dialysis last dose to be given 8/10. Hold in setting of sepsis.   Assessment/Plan: 1. AMS-Baseline dementia. Now with gangrenous L foot. Plans for TMA today, Dr. Stanford Breed.  2. Afib/elevated  HR-getting 12 lead. HR 110-134. BP elevated  189/98.  3. ESRD -HD today on schedule. K+ 3.7 Use 4.0 K bath 4. Anemia - HGB 10.2. Aranesp 25 mcg IV 05/20/21. Hold Venofer in setting of sepsis.  5. Secondary hyperparathyroidism -Ca 10 decrease hectorol to 1 mcg IV TIW. PO4 at goal.  6. HTN/volume - HR elevated to 130s. . No edema, appears dry. Taken out of UF giving 500cc NS now. HR down to 110. Run even for rest of tx. 7. Nutrition - NPO for surgery. Low albumin. Add protein supps. 8. DMT2-No diabetic meds on OP med list. Per primary.   Kenneth Cuaresma H. Donnivan Villena NP-C 05/22/2021, 8:35 AM  [employer] [phone]

## 2021-05-22 NOTE — Progress Notes (Signed)
Initial Nutrition Assessment  DOCUMENTATION CODES:  Not applicable  INTERVENTION:  Advance diet as medically able.  Add Glucerna Shake po TID, each supplement provides 220 kcal and 10 grams of protein.  Add 30 ml ProSource Plus po BID, each supplement provides 100 kcal and 15 grams of protein.   Add 1 packet Juven BID, each packet provides 95 calories, 2.5 grams of protein (collagen), and 9.8 grams of carbohydrate (3 grams sugar); also contains 7 grams of L-arginine and L-glutamine, 300 mg vitamin C, 15 mg vitamin E, 1.2 mcg vitamin B-12, 9.5 mg zinc, 200 mg calcium, and 1.5 g  Calcium Beta-hydroxy-Beta-methylbutyrate to support wound healing.  Continue Rena-Vit daily.  NUTRITION DIAGNOSIS:  Increased nutrient needs related to chronic illness (ESRD on HD) as evidenced by estimated needs.  GOAL:  Patient will meet greater than or equal to 90% of their needs  MONITOR:  Diet advancement, PO intake, Supplement acceptance, Labs, Weight trends, Skin, I & O's  REASON FOR ASSESSMENT:  Consult Assessment of nutrition requirement/status, Poor PO  ASSESSMENT:  70 yo male with a PMH of ESRD on HD MWF, lung cancer, PAD, HTN, T2DM, and polysubstance abuse who presents with worsening gangrene of L foot and AMS. 8/8 - L transmetatarsal amputation  Pt in OR at time of RD visit.  Pt with 0% PO intake all three meals on 8/6. Ate 50% of lunch on 8/7. No other documentation in Epic.  EDW: 60 kg   Per Epic, pt has lost ~13 lbs (10%) in the last 3 weeks, which is significant and severe for the time frame. Pt is now under dry weight, so this can be assumed to be true weight loss.  Medications: reviewed; PhosLo, Rena-Vit, miralax TID, Senokot, cefepime via IV, LR @ 10 ml/hr via IV, Vancomycin MWF via IV, oxycodone PRN (given once today)  Labs: reviewed; Na 133 (L), CBG 101-172 (H) HbA1c: 7.9% (04/2021)  NUTRITION - FOCUSED PHYSICAL EXAM: Unable to perform  Diet Order:   Diet Order              Diet NPO time specified  Diet effective midnight                  EDUCATION NEEDS:  No education needs have been identified at this time  Skin:  Skin Assessment: Skin Integrity Issues: Skin Integrity Issues:: Incisions Incisions: L metatarsal amputation, closed  Last BM:  PTA/unknown  Height:  Ht Readings from Last 1 Encounters:  05/22/21 5\' 6"  (1.676 m)   Weight:  Wt Readings from Last 1 Encounters:  05/22/21 54.8 kg   BMI:  Body mass index is 19.5 kg/m.  Estimated Nutritional Needs:  Kcal:  2000-2200 Protein:  70-85 grams Fluid:  >2 L  Derrel Nip, RD, LDN (she/her/hers) Registered Dietitian I After-Hours/Weekend Pager # in [place]

## 2021-05-23 ENCOUNTER — Encounter (HOSPITAL_COMMUNITY): Payer: Self-pay | Admitting: Vascular Surgery

## 2021-05-23 DIAGNOSIS — A419 Sepsis, unspecified organism: Secondary | ICD-10-CM | POA: Diagnosis not present

## 2021-05-23 DIAGNOSIS — L089 Local infection of the skin and subcutaneous tissue, unspecified: Secondary | ICD-10-CM | POA: Diagnosis not present

## 2021-05-23 DIAGNOSIS — R4182 Altered mental status, unspecified: Secondary | ICD-10-CM | POA: Diagnosis not present

## 2021-05-23 LAB — GLUCOSE, CAPILLARY
Glucose-Capillary: 250 mg/dL — ABNORMAL HIGH (ref 70–99)
Glucose-Capillary: 225 mg/dL — ABNORMAL HIGH (ref 70–99)
Glucose-Capillary: 234 mg/dL — ABNORMAL HIGH (ref 70–99)
Glucose-Capillary: 143 mg/dL — ABNORMAL HIGH (ref 70–99)
Glucose-Capillary: 157 mg/dL — ABNORMAL HIGH (ref 70–99)

## 2021-05-23 LAB — RENAL FUNCTION PANEL
Albumin: 2.8 g/dL — ABNORMAL LOW (ref 3.5–5.0)
Anion gap: 15 (ref 5–15)
BUN: 38 mg/dL — ABNORMAL HIGH (ref 8–23)
CO2: 23 mmol/L (ref 22–32)
Calcium: 9 mg/dL (ref 8.9–10.3)
Chloride: 94 mmol/L — ABNORMAL LOW (ref 98–111)
Creatinine, Ser: 6.03 mg/dL — ABNORMAL HIGH (ref 0.61–1.24)
GFR, Estimated: 9 mL/min — ABNORMAL LOW (ref >60)
Glucose, Bld: 248 mg/dL — ABNORMAL HIGH (ref 70–99)
Phosphorus: 4.9 mg/dL — ABNORMAL HIGH (ref 2.5–4.6)
Potassium: 4.8 mmol/L (ref 3.5–5.1)
Sodium: 132 mmol/L — ABNORMAL LOW (ref 135–145)

## 2021-05-23 LAB — CBC
HCT: 31.6 % — ABNORMAL LOW (ref 39.0–52.0)
Hemoglobin: 10.4 g/dL — ABNORMAL LOW (ref 13.0–17.0)
MCH: 33.9 pg (ref 26.0–34.0)
MCHC: 32.9 g/dL (ref 30.0–36.0)
MCV: 102.9 fL — ABNORMAL HIGH (ref 80.0–100.0)
Platelets: 312 10*3/uL (ref 150–400)
RBC: 3.07 MIL/uL — ABNORMAL LOW (ref 4.22–5.81)
RDW: 15.3 % (ref 11.5–15.5)
WBC: 15.5 10*3/uL — ABNORMAL HIGH (ref 4.0–10.5)
nRBC: 0 % (ref 0.0–0.2)

## 2021-05-23 MED ORDER — INSULIN GLARGINE-YFGN 100 UNIT/ML ~~LOC~~ SOLN
5.0000 [IU] | Freq: Every day | SUBCUTANEOUS | Status: DC
  Filled 2021-05-23 (×2): qty 0.05

## 2021-05-23 MED ORDER — POLYETHYLENE GLYCOL 3350 17 G PO PACK
17.0000 g | PACK | Freq: Once | ORAL | Status: AC
  Administered 2021-05-23: 17 g via ORAL
  Filled 2021-05-23: qty 1

## 2021-05-23 MED ORDER — SENNA 8.6 MG PO TABS: 1.0000 | ORAL_TABLET | Freq: Every day | ORAL | Status: DC

## 2021-05-23 MED ORDER — LORAZEPAM 2 MG/ML IJ SOLN: 1.0000 mg | Freq: Once | INTRAMUSCULAR | Status: DC

## 2021-05-23 MED ORDER — INSULIN GLARGINE-YFGN 100 UNIT/ML ~~LOC~~ SOLN
7.0000 [IU] | Freq: Every day | SUBCUTANEOUS | Status: DC
  Filled 2021-05-23: qty 0.07

## 2021-05-23 MED ORDER — LORAZEPAM 2 MG/ML IJ SOLN: 0.5000 mg | INTRAMUSCULAR | Status: DC | PRN

## 2021-05-23 MED ORDER — INSULIN ASPART 100 UNIT/ML IJ SOLN
0.0000 [IU] | Freq: Three times a day (TID) | INTRAMUSCULAR | Status: DC
  Administered 2021-05-23: 1 [IU] via SUBCUTANEOUS
  Administered 2021-05-23: 3 [IU] via SUBCUTANEOUS
  Administered 2021-05-24: 2 [IU] via SUBCUTANEOUS
  Administered 2021-05-24: 1 [IU] via SUBCUTANEOUS
  Administered 2021-05-25 – 2021-05-26 (×4): 2 [IU] via SUBCUTANEOUS
  Administered 2021-05-27: 3 [IU] via SUBCUTANEOUS
  Administered 2021-05-27 (×2): 2 [IU] via SUBCUTANEOUS
  Administered 2021-05-28: 1 [IU] via SUBCUTANEOUS
  Administered 2021-05-28 – 2021-05-29 (×3): 2 [IU] via SUBCUTANEOUS
  Administered 2021-05-29 – 2021-05-30 (×3): 1 [IU] via SUBCUTANEOUS
  Administered 2021-05-30 – 2021-05-31 (×3): 2 [IU] via SUBCUTANEOUS
  Administered 2021-05-31: 1 [IU] via SUBCUTANEOUS
  Administered 2021-05-31 – 2021-06-01 (×2): 2 [IU] via SUBCUTANEOUS

## 2021-05-23 MED ORDER — LORAZEPAM 0.5 MG PO TABS: 0.5000 mg | ORAL_TABLET | ORAL | Status: DC | PRN

## 2021-05-23 MED ORDER — OXYCODONE HCL 5 MG PO TABS
5.0000 mg | ORAL_TABLET | ORAL | Status: DC | PRN
  Administered 2021-05-23 – 2021-05-24 (×3): 5 mg via ORAL
  Filled 2021-05-23 (×2): qty 1

## 2021-05-23 NOTE — Evaluation (Signed)
Physical Therapy Evaluation Patient Details Name: Roger Vazquez MRN: [id number] DOB: 29-Apr-1951 Today's Date: 05/23/2021   History of Present Illness  pt is a 70 y/o male presenting 8/4 with AMS and worsening L foot gangrenne, s/p L TMA 8/8.  PMHx ESRD on HD MWF, Lung CA, PAD, HTN, DM, polysubstance abuse.  Clinical Impression  Pt admitted with/for AMS/worsening L foot gangrene, s/p L TMA.  No with low arousal level/confusion, needing mod assist of 1-2 persons.  .  Pt currently limited functionally due to the problems listed. ( See problems list.)   Pt will benefit from PT to maximize function and safety in order to get ready for next venue listed below.     Follow Up Recommendations SNF;Other (comment) (vs HHPT if progresses and has adequate assist.  TBA further when pt in alert.)    Equipment Recommendations  None recommended by PT;Other (comment) (TBA closer to d/c)    Recommendations for Other Services       Precautions / Restrictions Precautions Precautions: Fall Restrictions Weight Bearing Restrictions:  (WBAT on heel with DARCO shoe) LLE Weight Bearing: Partial weight bearing Other Position/Activity Restrictions: Heel weight bearing only L LE      Mobility  Bed Mobility Overal bed mobility: Needs Assistance Bed Mobility: Supine to Sit;Sit to Supine     Supine to sit: Mod assist;+2 for physical assistance Sit to supine: Mod assist;+2 for physical assistance   General bed mobility comments: pt need TC for direction and truncal/ LE assist for both up to EOB and sitting to supine.    Transfers Overall transfer level: Needs assistance   Transfers: Sit to/from Stand;Lateral/Scoot Transfers Sit to Stand: Mod assist;+2 physical assistance        Lateral/Scoot Transfers: Mod assist;+2 physical assistance General transfer comment: extra assist due to low arousal.  Ambulation/Gait             General Gait Details: pt unable today  Stairs             Wheelchair Mobility    Modified Rankin (Stroke Patients Only)       Balance Overall balance assessment: Needs assistance Sitting-balance support: Feet supported;Single extremity supported;Bilateral upper extremity supported Sitting balance-Leahy Scale: Poor Sitting balance - Comments: pt initially with heavy list R with improvement as session progressed, but never to the point of balance without reliance on UE's or external support.  suspect due to low arousal level.     Standing balance-Leahy Scale: Poor Standing balance comment: reliant on external support.                             Pertinent Vitals/Pain Pain Assessment: Faces Faces Pain Scale: Hurts a little bit Pain Location: L foot Pain Descriptors / Indicators: Grimacing;Guarding Pain Intervention(s): Monitored during session;Limited activity within patient's tolerance    Home Living Family/patient expects to be discharged to:: Private residence Living Arrangements: Alone Available Help at Discharge: Family;Available PRN/intermittently Type of Home: House Home Access: Level entry     Home Layout: One level Home Equipment: Walker - 2 wheels;Cane - single point Additional Comments: Information gained for pt's chart, pt too lethargic to participate well.    Prior Function Level of Independence:  (prior to L toe amp. previous admission, no family available to glean PLOF)         Comments: From chart review last admission, pt reports he was independent at home, driving, sister nearby and can assist  as needed     Hand Dominance        Extremity/Trunk Assessment   Upper Extremity Assessment Upper Extremity Assessment: Generalized weakness    Lower Extremity Assessment Lower Extremity Assessment: Generalized weakness (difficult to assess due to low arousal.)    Cervical / Trunk Assessment Cervical / Trunk Assessment: Normal  Communication   Communication: Other (comment) (mumbling with  low arousal.)  Cognition Arousal/Alertness: Lethargic Behavior During Therapy: Flat affect (presently sedate after period of agitation.) Overall Cognitive Status: Difficult to assess                                        General Comments General comments (skin integrity, edema, etc.): vss on RA, SpO2 94% on RA,    Exercises Other Exercises Other Exercises: Warm up ROM exercise prior to mobility.   Assessment/Plan    PT Assessment Patient needs continued PT services  PT Problem List Decreased strength;Decreased activity tolerance;Decreased balance;Decreased mobility;Decreased knowledge of use of DME;Decreased knowledge of precautions;Pain       PT Treatment Interventions DME instruction;Gait training;Functional mobility training;Therapeutic activities;Balance training;Patient/family education    PT Goals (Current goals can be found in the Care Plan section)  Acute Rehab PT Goals Patient Stated Goal: pt unable to participate PT Goal Formulation: Patient unable to participate in goal setting Time For Goal Achievement: 06/06/21 Potential to Achieve Goals: Good    Frequency Min 3X/week   Barriers to discharge Decreased caregiver support      Co-evaluation PT/OT/SLP Co-Evaluation/Treatment: Yes Reason for Co-Treatment: For patient/therapist safety;Other (comment) (pt sleepy and difficult to keep aroused.) PT goals addressed during session: Mobility/safety with mobility OT goals addressed during session: ADL's and self-care       AM-PAC PT "6 Clicks" Mobility  Outcome Measure Help needed turning from your back to your side while in a flat bed without using bedrails?: A Lot Help needed moving from lying on your back to sitting on the side of a flat bed without using bedrails?: A Lot Help needed moving to and from a bed to a chair (including a wheelchair)?: A Lot Help needed standing up from a chair using your arms (e.g., wheelchair or bedside chair)?: A  Lot Help needed to walk in hospital room?: A Lot Help needed climbing 3-5 steps with a railing? : A Lot 6 Click Score: 12    End of Session   Activity Tolerance: Patient limited by lethargy Patient left: in bed;with call bell/phone within reach;with nursing/sitter in room Nurse Communication: Mobility status PT Visit Diagnosis: Other abnormalities of gait and mobility (R26.89);Pain;Difficulty in walking, not elsewhere classified (R26.2) Pain - Right/Left: Left Pain - part of body:  (foot, TMA)    Time: 9030-0923 PT Time Calculation (min) (ACUTE ONLY): 30 min   Charges:   PT Evaluation $PT Eval Moderate Complexity: 1 Mod          05/23/2021  Ginger Carne., PT Acute Rehabilitation Services [phone]  (pager) [phone]  (office)  Tessie Fass Janett Kamath 05/23/2021, 12:02 PM

## 2021-05-23 NOTE — Progress Notes (Signed)
FPTS Interim Night Progress Note  S:Patient restless overnight.  Has received Oxy x 1 and Ativan x1.  Has not slept much overnight.  Rounded with primary night RN.  Has not been attempting to get out of bed but has been pulling at lines.  Had to put on mittens to protect IV site.    O: Today's Vitals   05/23/21 0333 05/23/21 0410 05/23/21 0414 05/23/21 0418  BP:  (!) 142/76    Pulse:  96    Resp:  17    Temp:  98.6 F (37 C)    TempSrc:  Oral    SpO2:  96%    Weight:   58.4 kg   Height:      PainSc: 10-Worst pain ever   4       A/P: Continue Delirium precautions Redirect patient Increased Oxy IR 5 mg q4 prn for pain. Recommend discontinue Ativan and trial Remeron If continue becomes agitated will order sitter  Carollee Leitz MD PGY-3, [place] Medicine Service pager [phone]

## 2021-05-23 NOTE — Progress Notes (Addendum)
[facility] Progress Note   Subjective: Seen in room. Very lethargic. SR/ST with pacs on monitor. Next HD 05/24/2021     Objective Vitals:   05/23/21 0410 05/23/21 0414 05/23/21 0601 05/23/21 0700  BP: (!) 142/76  (!) 151/80 (!) 141/61  Pulse: 96   (!) 103  Resp: 17     Temp: 98.6 F (37 C)   98.2 F (36.8 C)  TempSrc: Oral   Oral  SpO2: 96%     Weight:  58.4 kg    Height:       Physical Exam General: Chronically ill elderly male in NAD Neuro: More lethargic, oriented X 1 Heart:S1,S2  Tachy. ST with PACS on monitor. 2/6 systolic M. Lungs: CTAB No WOB Abdomen: NABS Extremities: Drsg intact L foot. No edema.  Dialysis Access: AVG +T/B   Additional Objective Labs: Basic Metabolic Panel: Recent Labs  Lab 05/21/21 0135 05/22/21 0117 05/23/21 0509  NA 136 133* 132*  K 3.4* 3.7 4.8  CL 99 97* 94*  CO2 23 21* 23  GLUCOSE 67* 109* 248*  BUN 38* 44* 38*  CREATININE 8.29* 9.47* 6.03*  CALCIUM 8.8* 9.0 9.0  PHOS 3.9 4.0 4.9*   Liver Function Tests: Recent Labs  Lab 05/18/21 2306 05/19/21 1741 05/21/21 0135 05/22/21 0117 05/23/21 0509  AST 42*  --   --   --   --   ALT 15  --   --   --   --   ALKPHOS 86  --   --   --   --   BILITOT 1.1  --   --   --   --   PROT 8.7*  --   --   --   --   ALBUMIN 3.5   < > 2.8* 2.6* 2.8*   < > = values in this interval not displayed.   No results for input(s): LIPASE, AMYLASE in the last 168 hours. CBC: Recent Labs  Lab 05/18/21 2306 05/19/21 0739 05/20/21 0220 05/21/21 0135 05/22/21 0117 05/23/21 0509  WBC 18.9* 16.3* 15.0* 16.0* 13.6* 15.5*  NEUTROABS 16.2*  --   --   --   --   --   HGB 11.0* 9.7* 9.1* 9.8* 10.2* 10.4*  HCT 32.3* 28.5* 27.1* 29.3* 30.8* 31.6*  MCV 103.5* 104.8* 103.8* 102.1* 102.7* 102.9*  PLT 293 247 261 271 262 312   Blood Culture    Component Value Date/Time   SDES BLOOD LEFT FOREARM 05/18/2021 1955   SDES BLOOD LEFT HAND 05/18/2021 1955   SPECREQUEST  05/18/2021 1955     BOTTLES DRAWN AEROBIC AND ANAEROBIC Blood Culture adequate volume   SPECREQUEST  05/18/2021 1955    BOTTLES DRAWN AEROBIC AND ANAEROBIC Blood Culture adequate volume   CULT  05/18/2021 1955    NO GROWTH 4 DAYS Performed at [facility], [place] [address], [place], [place] [address]    CULT  05/18/2021 1955    NO GROWTH 4 DAYS Performed at [facility] [address] [place], [place] [address]    REPTSTATUS PENDING 05/18/2021 1955   REPTSTATUS PENDING 05/18/2021 1955    Cardiac Enzymes: Recent Labs  Lab 05/19/21 0557  CKTOTAL 430*   CBG: Recent Labs  Lab 05/22/21 1449 05/22/21 1637 05/22/21 1946 05/23/21 0406 05/23/21 0758  GLUCAP 119* 167* 227* 250* 225*   Iron Studies: No results for input(s): IRON, TIBC, TRANSFERRIN, FERRITIN in the last 72 hours. @lablastinr3 @ Studies/Results: No results found. Medications:  sodium  chloride Stopped (05/22/21 2314)   ceFEPime (MAXIPIME) IV Stopped (05/22/21 2225)   lactated ringers Stopped (05/19/21 0019)   Vancomycin Stopped (05/22/21 1052)    (feeding supplement) PROSource Plus  30 mL Oral BID BM   acetaminophen  500 mg Oral Q6H   aspirin  81 mg Oral Daily   calcium acetate  667 mg Oral TID WC   clopidogrel  75 mg Oral Daily   [START ON 05/26/2021] darbepoetin (ARANESP) injection - DIALYSIS  25 mcg Intravenous Q Fri-HD   doxercalciferol  1 mcg Intravenous Q M,W,F-HD   feeding supplement (GLUCERNA SHAKE)  237 mL Oral TID BM   heparin  5,000 Units Subcutaneous Q8H   hydrALAZINE  50 mg Oral Q8H   insulin glargine-yfgn  7 Units Subcutaneous Daily   multivitamin  1 tablet Oral QHS   nutrition supplement (JUVEN)  1 packet Oral BID BM   polyethylene glycol  17 g Oral TID   senna  2 tablet Oral Daily     Dialysis Orders: [facility] MWF 3hr, 400/Autoflow 1.5, 60.kg, 2K/3Ca, AVG,  --Heparin 2,000 units IV TIW - Hectoral 43mcg IV q HD - Mircera 34mcg IV q 2 weeks - last 7/13 -Venofer 100 q. dialysis last dose to be given  8/10. Hold in setting of sepsis.    Assessment/Plan: 1. Gangrenous L Foot S/P TMA 05/22/21: POD 1. Per primary. Still on ABX.  2. AMS: Multifactorial. He has baseline dementia, H/O polysubstance abuse, sepsis from foot wound. Per primary  3. ESRD -MWF. Next HD 05/24/2021. No heparin.  4. Anemia - HGB 10.4. Aranesp 25 mcg IV 05/20/21. Hold Venofer in setting of sepsis.Will resume when ABX completed.  5. Secondary hyperparathyroidism -Ca 10 decrease hectorol to 1 mcg IV TIW. PO4 at goal. 6. HTN/volume - Issues with tachycardia during HD briefly converted to AFIB then back to ST. Appeared dry, given volume, ran even Net UF 50cc. No evidence of volume excess. Minimal UF with HD tomorrow.  7. Nutrition - Renal Diet. Low albumin. Add protein supps. 8. DMT2-No diabetic meds on OP med list. Per primary.  Roger Bula H. Tristyn Demarest NP-C 05/23/2021, 8:26 AM  [employer] [phone]

## 2021-05-23 NOTE — Progress Notes (Signed)
Inpatient Diabetes Program Recommendations  AACE/ADA: New Consensus Statement on Inpatient Glycemic Control (2015)  Target Ranges:  Prepandial:   less than 140 mg/dL      Peak postprandial:   less than 180 mg/dL (1-2 hours)      Critically ill patients:  140 - 180 mg/dL   Lab Results  Component Value Date   GLUCAP 225 (H) 05/23/2021   HGBA1C 7.9 (H) 04/27/2021    Review of Glycemic Control Results for Roger Vazquez, Roger Vazquez (MRN [id number]) as of 05/23/2021 10:11  Ref. Range 05/22/2021 16:37 05/22/2021 19:46 05/23/2021 04:06 05/23/2021 07:58  Glucose-Capillary Latest Ref Range: 70 - 99 mg/dL 167 (H) 227 (H) 250 (H) 225 (H)   Diabetes history: Type 2 DM Outpatient Diabetes medications: Lantus 15 units QD (NT), Novolog 0-6 units TID (NT) Current orders for Inpatient glycemic control: Semglee 5 units QD Decadron 4 mg x1 Inpatient Diabetes Program Recommendations:    Consider adding Novolog 0-6 units TID.  Thanks, Bronson Curb, MSN, RNC-OB Diabetes Coordinator [phone] (8a-5p)

## 2021-05-23 NOTE — Plan of Care (Signed)

## 2021-05-23 NOTE — Progress Notes (Addendum)
Family Medicine Teaching Service Daily Progress Note Intern Pager: [phone]  Patient name: Roger Vazquez Medical record number: [id number] Date of birth: Apr 12, 1951 Age: 70 y.o. Gender: male  Primary Care Provider: [facility] Consultants: VVS, Nephro Code Status: Full  Pt Overview and Major Events to Date:  8/5- admitted 8/6- recurrent hypoglycemia 8/8- LLE transmetatarsal amputation  Assessment and Plan: Roger Vazquez is a 70 year old male with a history of ESRD on HD MWF, history of lung CA, PAD, HTN, diabetes, polysubstance abuse.   Worsening Gangrene of L Foot, s/p transmetatarsal amputation Post-op day 1. Was restless and in pain overnight. Received oxycodone for pain control with good result. Cultures remain with NGTD. - Continue vanc + cefepime (day 5), defer to vascular on whether they believe they have achieved surgical cure and abx can be d/c'd or deescalated - Continue Tylenol 500mg  q6h scheduled - Continue oxycodone 5mg  q4 PRN  - PT/OT to see  Altered Mental Status  Hx of Polysubstance abuse Received one dose of ativan overnight. Required mitts overnight as he was pulling at lines. This am during my rounds he was trying to climb out of bed, pulling at lines, and swatting at nurses. CIWA 15. Given 2mg  ativan.  His mental status does seem to worsen overnight, suspect sundowning.  - 1-to-1 sitter  - Continue delirium precautions - Continue to monitor CIWA with ativan  ESRD on HD Dialyzed yesterday without issue, back on his home MWF schedule. - Dialysis per nephro - Continue renal vitamin and PhosLo  Urinary Retention Bladder scan this am with 507cc. Patient unable to cooperate with voluntary void. Possible this is contributing to his mental status. - Will I & O cath once agitation improves   Constipation  One stool occurrence charted. - Continue current regimen  HTN  Last BP 142/76. -Continue home hydralazine 50 mg TID  DM2 Glucose overnight  167-250. Did not receive Lantus yesterday due to NPO for surgery. Currently not eating well given fluctuating mental status. - Will add sSSI, continue to hold LAI as he had several hypoglycemic episodes a few days ago  FEN/GI: Renal, 1214mL fluid restriction PPx: Heparin Dispo:Pending PT recommendations  in 2-3 days. Barriers include postop healing.   Subjective:  Roger Vazquez is confused and agitated during my rounds.  He was trying to climb out of bed saying that he was going to take a shower, was raising his voice and swiping at nursing staff, uncooperative with nurses trying to place mitts due to him pulling at lines.  Denies any pain in his foot.  Objective: Temp:  [97.9 F (36.6 C)-99.8 F (37.7 C)] 98.2 F (36.8 C) (08/09 0700) Pulse Rate:  [96-112] 103 (08/09 0700) Resp:  [13-28] 17 (08/09 0410) BP: (117-158)/(50-119) 141/61 (08/09 0700) SpO2:  [96 %-100 %] 96 % (08/09 0410) FiO2 (%):  [28 %] 28 % (08/08 1437) Weight:  [54.8 kg-58.4 kg] 58.4 kg (08/09 0414) Physical Exam: General: Confused, agitated Cardiovascular: Irregular, transmitted fistula sounds Respiratory: Normal work of breathing on room air, no wheezes, rales, or rhonchi Abdomen: Soft, nondistended, nontender Extremities: Left foot with surgical dressing in place, no erythema, streaking extending proximally.  No edema  Laboratory: Recent Labs  Lab 05/21/21 0135 05/22/21 0117 05/23/21 0509  WBC 16.0* 13.6* 15.5*  HGB 9.8* 10.2* 10.4*  HCT 29.3* 30.8* 31.6*  PLT 271 262 312   Recent Labs  Lab 05/18/21 2306 05/19/21 1741 05/21/21 0135 05/22/21 0117 05/23/21 0509  NA 135   < > 136  133* 132*  K 4.1   < > 3.4* 3.7 4.8  CL 96*   < > 99 97* 94*  CO2 22   < > 23 21* 23  BUN 56*   < > 38* 44* 38*  CREATININE 10.69*   < > 8.29* 9.47* 6.03*  CALCIUM 9.2   < > 8.8* 9.0 9.0  PROT 8.7*  --   --   --   --   BILITOT 1.1  --   --   --   --   ALKPHOS 86  --   --   --   --   ALT 15  --   --   --   --   AST 42*   --   --   --   --   GLUCOSE 161*   < > 67* 109* 248*   < > = values in this interval not displayed.    Imaging/Diagnostic Tests: No new imaging, tests  Eppie Gibson, MD 05/23/2021, 8:54 AM PGY-1, [place] Intern pager: [phone], text pages welcome

## 2021-05-23 NOTE — Progress Notes (Signed)
Inpatient Rehabilitation Admissions Coordinator   Inpatient rehab consult received, I await more participation with therapy to assist with planning rehab venue options. We do have [facility].  Danne Baxter, RN, MSN Rehab Admissions Coordinator [phone] 05/23/2021 12:18 PM

## 2021-05-23 NOTE — Therapy (Signed)
Occupational Therapy Evaluation Patient Details Name: Roger Vazquez MRN: [id number] DOB: October 22, 1950 Today's Date: 05/23/2021    History of Present Illness Pt is a 70 y/o male presenting 8/4 with AMS and worsening L foot gangrenne, s/p L TMA 8/8.  PMHx ESRD on HD MWF, Lung CA, PAD, HTN, DM, polysubstance abuse.   Clinical Impression   Pt admitted as above presenting with problems as listed below impacting his ability to perform functional mobility, transfers and ADL/self care tasks. Pt with low arousal level and confusion,. He currently requires Mod assist +2.  He should benefit from acute OT to assist in maximizing independence with ADL/self care tasks and functional transfers related to ADL's. No family present to assist with PLOF during this assessment.    Follow Up Recommendations  SNF;Supervision/Assistance - 24 hour;Other (comment) (Continue to monitor as pt lived alone prior. Pt may not require SNF if level of arousal and participation improves and family is able to provide assist PRN.)    Equipment Recommendations  Other (comment) (Defer to next venue)    Recommendations for Other Services       Precautions / Restrictions Precautions Precautions: Fall Restrictions Weight Bearing Restrictions:  (WBAT on L heel with DARCO shoe on) LLE Weight Bearing: Partial weight bearing Other Position/Activity Restrictions: Heel weight bearing only L LE      Mobility Bed Mobility Overal bed mobility: Needs Assistance Bed Mobility: Supine to Sit;Sit to Supine     Supine to sit: Mod assist;+2 for physical assistance Sit to supine: Mod assist;+2 for physical assistance   General bed mobility comments: pt need TC for direction and truncal/ LE assist for both up to EOB and sitting to supine.    Transfers Overall transfer level: Needs assistance   Transfers: Sit to/from Stand;Lateral/Scoot Transfers Sit to Stand: Mod assist;+2 physical assistance        Lateral/Scoot Transfers:  Mod assist;+2 physical assistance General transfer comment: extra assist due to low arousal.    Balance Overall balance assessment: Needs assistance Sitting-balance support: Feet supported;Single extremity supported;Bilateral upper extremity supported Sitting balance-Leahy Scale: Poor Sitting balance - Comments: pt initially with heavy list R with improvement as session progressed, but never to the point of balance without reliance on UE's or external support.  suspect due to low arousal level.     Standing balance-Leahy Scale: Poor Standing balance comment: reliant on external support. (Difficult to arouse, difficulty following commands.)     ADL either performed or assessed with clinical judgement   ADL Overall ADL's : Needs assistance/impaired     Grooming: Moderate assistance;Bed level   Upper Body Bathing: Moderate assistance;Maximal assistance;Bed level   Lower Body Bathing: Total assistance   Upper Body Dressing : Maximal assistance;Bed level   Lower Body Dressing: Total assistance;Bed level   Toilet Transfer: +2 for physical assistance;+2 for safety/equipment;Squat-pivot;BSC;Moderate assistance Toilet Transfer Details (indicate cue type and reason): Simulated sit to stand and SPT with +2 Mod A - due to pt level of arousal Toileting- Clothing Manipulation and Hygiene: Total assistance;+2 for physical assistance;+2 for safety/equipment;Sit to/from stand (Pt was soiled and required Mod A +2 to stand and total assist for peri/anal care following small BM in bed.)       Functional mobility during ADLs: Moderate assistance;+2 for physical assistance;+2 for safety/equipment;Cueing for safety;Cueing for sequencing (Attempted using RW, but pt with decreased level of arousal during sit to stand. Also sit to stand x2 w/o RW requiring +2 Mod-max assist.) General ADL Comments: Pt currently requiring  increased assistance (+2) with ADL's and functinoal transfers related to ADL/selfcare  due to decreased level of arousal. No family present to assist with baseline level of care however chart reivew indicates that pt lived alone and has a h/o dementia at baseline.     Vision Baseline Vision/History:  (Unknown, pt keeping eyes shut tight, not quite opening when asked. Difficulty following commands.)              Pertinent Vitals/Pain Pain Assessment: Faces Faces Pain Scale: Hurts a little bit Pain Location: L foot Pain Descriptors / Indicators: Grimacing;Guarding Pain Intervention(s): Monitored during session;Limited activity within patient's tolerance;Premedicated before session;Repositioned     Hand Dominance  Right   Extremity/Trunk Assessment Upper Extremity Assessment Upper Extremity Assessment: Generalized weakness (Difficult to assess due to low arousal)   Lower Extremity Assessment Lower Extremity Assessment: Defer to PT evaluation   Cervical / Trunk Assessment Cervical / Trunk Assessment: Normal   Communication Communication Communication: Other (comment) (Mumbling with low arousal)   Cognition Arousal/Alertness: Lethargic Behavior During Therapy: Flat affect (Presently sedate after period of agitation earlier) Overall Cognitive Status: Difficult to assess (No family present to determine baseline level of function)     General Comments  vss on RA, SpO2 94% on RA    Exercises Exercises: Other exercises Other Exercises Other Exercises: Warm up ROM exercise prior to mobility.        Home Living Family/patient expects to be discharged to:: Private residence Living Arrangements: Alone Available Help at Discharge: Family;Available PRN/intermittently Type of Home: House Home Access: Level entry     Home Layout: One level     Bathroom Shower/Tub: Occupational psychologist: Standard     Home Equipment: Environmental consultant - 2 wheels;Cane - single point   Additional Comments: Information gained from pt's chart, pt too lethargic to participate, no  family present.      Prior Functioning/Environment Level of Independence:  (Prior to left toe ampuation, no family present to assist with PLOF, pt unable to state.)        Comments: From chart review last admission, pt reports he was independent at home, driving, sister nearby and can assist as needed        OT Problem List: Decreased strength;Decreased activity tolerance;Impaired balance (sitting and/or standing);Decreased cognition;Decreased safety awareness;Decreased knowledge of use of DME or AE;Decreased knowledge of precautions      OT Treatment/Interventions: Self-care/ADL training;Therapeutic exercise;DME and/or AE instruction;Therapeutic activities;Patient/family education    OT Goals(Current goals can be found in the care plan section) Acute Rehab OT Goals Patient Stated Goal: Pt unable OT Goal Formulation: Patient unable to participate in goal setting Time For Goal Achievement: 06/13/21 Potential to Achieve Goals: Fair ADL Goals Pt Will Perform Grooming: with modified independence;sitting Pt Will Perform Lower Body Bathing: with min assist;sit to/from stand;sitting/lateral leans Pt Will Transfer to Toilet: with modified independence;ambulating;bedside commode Pt Will Perform Toileting - Clothing Manipulation and hygiene: with modified independence;sitting/lateral leans;sit to/from stand Pt Will Perform Tub/Shower Transfer: Tub transfer;Shower transfer;with min assist;ambulating;shower seat;tub bench;grab bars;rolling walker  OT Frequency: Min 2X/week   Barriers to D/C: Other (comment) (Unknown. Chart review indicates that pt lives alone, has h/o dementia. Unsure of PLOF or if family assists/is able to assist PRN.)  Pt lives alone per chart review       Co-evaluation   Reason for Co-Treatment: For patient/therapist safety;Other (comment) (Pt sleepy and difficult to keep aroused) PT goals addressed during session: Mobility/safety with mobility OT goals addressed  during session: ADL's and self-care      AM-PAC OT "6 Clicks" Daily Activity     Outcome Measure Help from another person eating meals?: A Little Help from another person taking care of personal grooming?: A Little Help from another person toileting, which includes using toliet, bedpan, or urinal?: Total Help from another person bathing (including washing, rinsing, drying)?: A Lot Help from another person to put on and taking off regular upper body clothing?: A Little Help from another person to put on and taking off regular lower body clothing?: A Lot 6 Click Score: 14   End of Session Equipment Utilized During Treatment: Gait belt;Rolling walker Nurse Communication: Mobility status;Other (comment) (Decreased level of arousal, +2 assist required for transfers)  Activity Tolerance: Other (comment);Patient limited by fatigue;Patient limited by lethargy (Pt limited by decreased level of arousal) Patient left: in bed;with call bell/phone within reach;with bed alarm set  OT Visit Diagnosis: Unsteadiness on feet (R26.81);Other abnormalities of gait and mobility (R26.89);Muscle weakness (generalized) (M62.81);Other symptoms and signs involving cognitive function                Time: 2984-7308 OT Time Calculation (min): 22 min Charges:  OT General Charges $OT Visit: 1 Visit OT Evaluation $OT Eval Moderate Complexity: 1 Mod  Sydnee Lamour Beth Dixon, OTR/L 05/23/2021, 12:46 PM

## 2021-05-23 NOTE — Progress Notes (Signed)
Noted pt.has not voided since last night.Bladder scan done & obtained 307 cc & repeated scan is 500cc. MD on call was called & said she will call back bec.she has an emergency .Will  endorse to day shift nurse.

## 2021-05-23 NOTE — Progress Notes (Addendum)
  Progress Note    05/23/2021 7:59 AM 1 Day Post-Op  Subjective:  seems somewhat confused/agitated. Pulling off all of lines   Vitals:   05/23/21 0601 05/23/21 0700  BP: (!) 151/80 (!) 141/61  Pulse:  (!) 103  Resp:    Temp:    SpO2:     Physical Exam: Cardiac:  regular Lungs:  non labored Incisions:  left TMA site dressings clean, dry and intact Extremities:  2+ femoral pulses, no palpable distal pulses. R foot warm Neurologic: alert but not oriented  CBC    Component Value Date/Time   WBC 15.5 (H) 05/23/2021 0509   RBC 3.07 (L) 05/23/2021 0509   HGB 10.4 (L) 05/23/2021 0509   HCT 31.6 (L) 05/23/2021 0509   PLT 312 05/23/2021 0509   MCV 102.9 (H) 05/23/2021 0509   MCH 33.9 05/23/2021 0509   MCHC 32.9 05/23/2021 0509   RDW 15.3 05/23/2021 0509   LYMPHSABS 1.3 05/18/2021 2306   MONOABS 1.2 (H) 05/18/2021 2306   EOSABS 0.0 05/18/2021 2306   BASOSABS 0.1 05/18/2021 2306    BMET    Component Value Date/Time   NA 132 (L) 05/23/2021 0509   K 4.8 05/23/2021 0509   CL 94 (L) 05/23/2021 0509   CO2 23 05/23/2021 0509   GLUCOSE 248 (H) 05/23/2021 0509   BUN 38 (H) 05/23/2021 0509   CREATININE 6.03 (H) 05/23/2021 0509   CALCIUM 9.0 05/23/2021 0509   CALCIUM 7.1 (L) 08/01/2018 0944   GFRNONAA 9 (L) 05/23/2021 0509   GFRAA 5 (L) 08/11/2018 0858    INR    Component Value Date/Time   INR 1.1 05/18/2021 2306     Intake/Output Summary (Last 24 hours) at 05/23/2021 0759 Last data filed at 05/23/2021 0715 Gross per 24 hour  Intake 728.29 ml  Output 150 ml  Net 578.29 ml     Assessment/Plan:  70 y.o. male is s/p L TMA 1 Day Post-Op   L TMA dressings clean dry and intact Dressing take down tomorrow Confused/ agitated this morning PT/ OT to eval Heel weight bearing only with Darco shoe  DVT prophylaxis:  sq heparin   Karoline Caldwell, PA-C [facility] [phone] 05/23/2021 7:59 AM  Agree with above Pt confused.  Says he has no foot  pain Change dressing in am  Ruta Hinds, MD [facility] [place] Office: [phone]

## 2021-05-23 NOTE — Progress Notes (Signed)
Orthopedic Tech Progress Note Patient Details:  Roger Vazquez Apr 24, 1951 [id number]  Ortho Devices Type of Ortho Device: Darco shoe Ortho Device/Splint Location: LLE Ortho Device/Splint Interventions: Ordered, Application, Adjustment   Post Interventions Patient Tolerated: Well Instructions Provided: Care of [place] 05/23/2021, 8:20 AM

## 2021-05-24 DIAGNOSIS — A419 Sepsis, unspecified organism: Secondary | ICD-10-CM | POA: Diagnosis not present

## 2021-05-24 DIAGNOSIS — L089 Local infection of the skin and subcutaneous tissue, unspecified: Secondary | ICD-10-CM | POA: Diagnosis not present

## 2021-05-24 DIAGNOSIS — R4182 Altered mental status, unspecified: Secondary | ICD-10-CM | POA: Diagnosis not present

## 2021-05-24 LAB — BASIC METABOLIC PANEL
Anion gap: 13 (ref 5–15)
BUN: 61 mg/dL — ABNORMAL HIGH (ref 8–23)
CO2: 24 mmol/L (ref 22–32)
Calcium: 9 mg/dL (ref 8.9–10.3)
Chloride: 98 mmol/L (ref 98–111)
Creatinine, Ser: 7.69 mg/dL — ABNORMAL HIGH (ref 0.61–1.24)
GFR, Estimated: 7 mL/min — ABNORMAL LOW (ref >60)
Glucose, Bld: 147 mg/dL — ABNORMAL HIGH (ref 70–99)
Potassium: 4.3 mmol/L (ref 3.5–5.1)
Sodium: 135 mmol/L (ref 135–145)

## 2021-05-24 LAB — CBC
HCT: 30.3 % — ABNORMAL LOW (ref 39.0–52.0)
Hemoglobin: 9.9 g/dL — ABNORMAL LOW (ref 13.0–17.0)
MCH: 34.3 pg — ABNORMAL HIGH (ref 26.0–34.0)
MCHC: 32.7 g/dL (ref 30.0–36.0)
MCV: 104.8 fL — ABNORMAL HIGH (ref 80.0–100.0)
Platelets: 313 10*3/uL (ref 150–400)
RBC: 2.89 MIL/uL — ABNORMAL LOW (ref 4.22–5.81)
RDW: 15.3 % (ref 11.5–15.5)
WBC: 14 10*3/uL — ABNORMAL HIGH (ref 4.0–10.5)
nRBC: 0 % (ref 0.0–0.2)

## 2021-05-24 LAB — CULTURE, BLOOD (ROUTINE X 2)
Culture: NO GROWTH
Culture: NO GROWTH
Special Requests: ADEQUATE
Special Requests: ADEQUATE

## 2021-05-24 LAB — GLUCOSE, CAPILLARY
Glucose-Capillary: 134 mg/dL — ABNORMAL HIGH (ref 70–99)
Glucose-Capillary: 105 mg/dL — ABNORMAL HIGH (ref 70–99)
Glucose-Capillary: 195 mg/dL — ABNORMAL HIGH (ref 70–99)
Glucose-Capillary: 144 mg/dL — ABNORMAL HIGH (ref 70–99)

## 2021-05-24 LAB — PHOSPHORUS: Phosphorus: 3.8 mg/dL (ref 2.5–4.6)

## 2021-05-24 LAB — ALBUMIN: Albumin: 2.5 g/dL — ABNORMAL LOW (ref 3.5–5.0)

## 2021-05-24 MED ORDER — ALTEPLASE 2 MG IJ SOLR
2.0000 mg | Freq: Once | INTRAMUSCULAR | Status: DC | PRN
  Filled 2021-05-24: qty 2

## 2021-05-24 MED ORDER — PENTAFLUOROPROP-TETRAFLUOROETH EX AERO: 1.0000 "application " | INHALATION_SPRAY | CUTANEOUS | Status: DC | PRN

## 2021-05-24 MED ORDER — SODIUM CHLORIDE 0.9 % IV SOLN: 100.0000 mL | INTRAVENOUS | Status: DC | PRN

## 2021-05-24 MED ORDER — HEPARIN SODIUM (PORCINE) 1000 UNIT/ML DIALYSIS
1000.0000 [IU] | INTRAMUSCULAR | Status: DC | PRN
  Filled 2021-05-24: qty 1

## 2021-05-24 MED ORDER — LIDOCAINE-PRILOCAINE 2.5-2.5 % EX CREA
1.0000 "application " | TOPICAL_CREAM | CUTANEOUS | Status: DC | PRN
  Filled 2021-05-24: qty 5

## 2021-05-24 MED ORDER — LIDOCAINE HCL (PF) 1 % IJ SOLN: 5.0000 mL | INTRAMUSCULAR | Status: DC | PRN

## 2021-05-24 MED ORDER — OXYCODONE HCL 5 MG PO TABS
2.5000 mg | ORAL_TABLET | ORAL | Status: AC | PRN
  Administered 2021-05-24 – 2021-05-27 (×10): 2.5 mg via ORAL
  Filled 2021-05-24 (×10): qty 1

## 2021-05-24 NOTE — Plan of Care (Signed)

## 2021-05-24 NOTE — Evaluation (Signed)
Clinical/Bedside Swallow Evaluation Patient Details  Name: Roger Vazquez MRN: [id number] Date of Birth: 04-04-1951  Today's Date: 05/24/2021 Time: SLP Start Time (ACUTE ONLY): 0626 SLP Stop Time (ACUTE ONLY): 9485 SLP Time Calculation (min) (ACUTE ONLY): 20 min  Past Medical History:  Past Medical History:  Diagnosis Date   Cancer ([place])    Lung   Diabetes mellitus    Hypertension    Renal disorder    MWF- [facility]   Past Surgical History:  Past Surgical History:  Procedure Laterality Date   ABDOMINAL AORTOGRAM W/LOWER EXTREMITY Left 04/28/2021   Procedure: ABDOMINAL AORTOGRAM W/LOWER EXTREMITY;  Surgeon: Cherre Robins, MD;  Location: [facility];  Service: Cardiovascular;  Laterality: Left;   AMPUTATION Left 05/04/2021   Procedure: LEFT SECOND TOE AMPUTATION;  Surgeon: Cherre Robins, MD;  Location: [facility];  Service: Vascular;  Laterality: Left;   artiovenous graft placement Left    AV FISTULA PLACEMENT Right 11/27/2018   Procedure: INSERTION 4-7MM X 45CM GORETEX GRAFT RIGHT FOREARM ;  Surgeon: Rosetta Posner, MD;  Location: [place];  Service: Vascular;  Laterality: Right;   [place] REMOVAL Left 08/06/2018   Procedure: REMOVAL OF LEFT ARM GRAFT;  Surgeon: Rosetta Posner, MD;  Location: [facility];  Service: Vascular;  Laterality: Left;   INSERTION OF DIALYSIS CATHETER Right 08/08/2018   Procedure: INSERTION OF TUNNELED  DIALYSIS CATHETER;  Surgeon: Marty Heck, MD;  Location: [facility];  Service: Vascular;  Laterality: Right;   IR REMOVAL TUN CV CATH W/O FL  02/19/2019   LUNG SURGERY     patient not if they removed part of lung. Was done at [facility] Left 04/28/2021   Procedure: PERIPHERAL VASCULAR BALLOON ANGIOPLASTY;  Surgeon: Cherre Robins, MD;  Location: [facility];  Service: Cardiovascular;  Laterality: Left;  popliteal and posterial tibial   TEE WITHOUT CARDIOVERSION N/A 07/31/2018   Procedure: TRANSESOPHAGEAL  ECHOCARDIOGRAM (TEE);  Surgeon: Pixie Casino, MD;  Location: [facility];  Service: Cardiovascular;  Laterality: N/A;   TRANSMETATARSAL AMPUTATION Left 05/22/2021   Procedure: LEFT TRANSMETATARSAL AMPUTATION;  Surgeon: Elam Dutch, MD;  Location: [facility];  Service: Vascular;  Laterality: Left;   HPI:  Roger Vazquez is a 70 y.o. male who presented with AMS and worsening left foot gangrene s/p L transmetatarsal amputation 8/8. Initial swallow eval 8/7 WFL but recommending Dys 3 solids given mentation and missing dentition. SLP asked to re-evaluate as pt has been requiring increased sedation. PMHx significant for ESRD on HD MWF, hx of lung cancer, PAD, HTN, T2DM, and polysubstance abuse.   Assessment / Plan / Recommendation Clinical Impression  Pt was re-evaluated for swallowing with perhaps reduced oral preparation noted compared to initial eval. It is not clear if this is a true change in performance vs the solids just being a little too challenging of a texture for him when he does not have his dentures. WIth small bites of fruit from a fruit cup he did end up having to expectorate some of the harder pieces after a prolonged effort at chewing them. Will soften his diet a little further to Dys 2 (finely chopped), thin liquids, and will f/u at least briefly to ensure tolerance. SLP Visit Diagnosis: Dysphagia, oral phase (R13.11);Dysphagia, unspecified (R13.10)    Aspiration Risk  Mild aspiration risk    Diet Recommendation Dysphagia 2 (Fine chop);Thin liquid   Liquid Administration via: Cup;Straw Medication Administration: Whole  meds with liquid Supervision: Staff to assist with self feeding Compensations: Minimize environmental distractions;Slow rate;Small sips/bites Postural Changes: Seated upright at 90 degrees    Other  Recommendations Oral Care Recommendations: Oral care BID   Follow up Recommendations 24 hour supervision/assistance      Frequency and Duration min 2x/week  1  week       Prognosis Prognosis for Safe Diet Advancement: Fair Barriers to Reach Goals: Cognitive deficits;Other (Comment) (question how solid he typically eats without his dentures)      Swallow Study   General HPI: Roger Vazquez is a 70 y.o. male who presented with AMS and worsening left foot gangrene s/p L transmetatarsal amputation 8/8. Initial swallow eval 8/7 WFL but recommending Dys 3 solids given mentation and missing dentition. SLP asked to re-evaluate as pt has been requiring increased sedation. PMHx significant for ESRD on HD MWF, hx of lung cancer, PAD, HTN, T2DM, and polysubstance abuse. Type of Study: Bedside Swallow Evaluation Previous Swallow Assessment: see HPI Diet Prior to this Study: Dysphagia 3 (soft);Thin liquids Temperature Spikes Noted: No Respiratory Status: Room air History of Recent Intubation: No Behavior/Cognition: Alert;Requires cueing;Cooperative;Confused Oral Cavity Assessment: Within Functional Limits Oral Care Completed by SLP: No Oral Cavity - Dentition: Dentures, not available;Edentulous Vision: Functional for self-feeding Self-Feeding Abilities: Total assist Patient Positioning: Upright in bed Baseline Vocal Quality: Hoarse Volitional Cough: Weak Volitional Swallow: Able to elicit    Oral/Motor/Sensory Function Overall Oral Motor/Sensory Function: Generalized oral weakness   Ice Chips Ice chips: Not tested   Thin Liquid Thin Liquid: Within functional limits Presentation: Straw    Nectar Thick Nectar Thick Liquid: Not tested   Honey Thick Honey Thick Liquid: Not tested   Puree Puree: Impaired Presentation: Spoon Oral Phase Impairments: Reduced lingual movement/coordination Oral Phase Functional Implications: Prolonged oral transit   Solid     Solid: Impaired Oral Phase Impairments: Impaired mastication;Reduced lingual movement/coordination Oral Phase Functional Implications: Prolonged oral transit;Impaired mastication;Oral residue       Osie Bond., M.A. [facility] Pager [phone] Office [phone]  05/24/2021,9:44 AM

## 2021-05-24 NOTE — Progress Notes (Signed)
FPTS Interim Night Progress Note  S:Patient sleeping comfortably.  Rounded with primary night RN. Has not been agitated tonight.  Was difficult to arouse today after receiving Ativan.  Has not received additional doses overnight.  No concerns voiced.  No orders required.    O: Today's Vitals   05/24/21 0002 05/24/21 0405 05/24/21 0504 05/24/21 0507  BP: (!) 136/57 (!) 155/77 (!) 148/68   Pulse: 93 88    Resp: 15 19    Temp: 98.3 F (36.8 C) 97.6 F (36.4 C)    TempSrc: Oral Oral    SpO2: 100% 100%    Weight:  58.5 kg    Height:      PainSc:    10-Worst pain ever      A/P: Will discontinue Ativan Can reassess need for chemical restraint as needed Redirect patient Continue Delirium/Fall precautions Continue to monitor  Carollee Leitz MD PGY-3, [place] [facility] Service pager [phone]

## 2021-05-24 NOTE — Progress Notes (Signed)
Physical Therapy Treatment Patient Details Name: Roger Vazquez MRN: [id number] DOB: [birth year] Today's Date: 05/24/2021    History of Present Illness Pt is a 70 y.o. male admitted 05/18/21 with AMS, worsening L foot gangrene. S/p L TMA on 8/8. Course complicated by delirium, CIWA. PMH includes ESRD (HD MWF), lung CA, PAD (s/p L toe amputation), HTN, DM.   PT Comments    Pt with improved alertness compared to prior session, still limited by cognitive status, including slowed processing, poor command following and decreased awareness. Pt demonstrates improved sitting balance, requiring mod-maxA to stand and maxA to maintain static standing; pt unable to initiate pre-gait activity. Pt unaware of bowel incontinence, dependent for pericare. Recommend SNF-level therapies to maximize functional mobility and independence prior to return home.   Follow Up Recommendations  SNF;Supervision for mobility/OOB     Equipment Recommendations  TBD - potentially: Rolling walker with 5" wheels;3in1;Wheelchair;Wheelchair cushion    Recommendations for Other Services       Precautions / Restrictions Precautions Precautions: Fall Restrictions Weight Bearing Restrictions: Yes LLE Weight Bearing: Partial weight bearing Other Position/Activity Restrictions: LLE WB through heel in darco shoe    Mobility  Bed Mobility Overal bed mobility: Needs Assistance Bed Mobility: Supine to Sit;Sit to Supine     Supine to sit: Mod assist;Max assist Sit to supine: Mod assist   General bed mobility comments: Little to no initiation of movement despite max verbal cues, requiring tactile cues and physical assist to initiate movement; mod-maxA to initiate BLE movement, trunk elevation and scooting hips to EOB; pt with improved participation return to supine, requiring modA for hip and trunk repositioning    Transfers Overall transfer level: Needs assistance Equipment used: Rolling walker (2 wheeled);None Transfers:  Sit to/from Stand Sit to Stand: Mod assist;Max assist         General transfer comment: Pt with little to no movement initiation; maxA to initiate trunk elevation, requiring modA to achieve fully upright posture; tolerated 3x standing trials; pt unable to maintain grip on RW, therefore performed without DME; L darco shoe donned  Ambulation/Gait             General Gait Details: pt unable today   Stairs             Wheelchair Mobility    Modified Rankin (Stroke Patients Only)       Balance Overall balance assessment: Needs assistance   Sitting balance-Leahy Scale: Fair Sitting balance - Comments: Initial poor balance with R lateral lean requiring tactile cues to correct, progressing to verbal cues, then able to maintain unsupported sitting with min guard     Standing balance-Leahy Scale: Zero Standing balance comment: Reliant on max external support to maintain upright standing, keeping BLEs braced against EOB; dependent for pericare while standing                            Cognition Arousal/Alertness: Lethargic;Awake/alert Behavior During Therapy: Flat affect Overall Cognitive Status: No family/caregiver present to determine baseline cognitive functioning Area of Impairment: Orientation;Attention;Memory;Following commands;Safety/judgement;Awareness;Problem solving                 Orientation Level: Disoriented to;Time;Situation Current Attention Level: Focused;Sustained Memory: Decreased recall of precautions Following Commands: Follows one step commands inconsistently;Follows one step commands with increased time Safety/Judgement: Decreased awareness of safety;Decreased awareness of deficits Awareness: Intellectual Problem Solving: Slow processing;Decreased initiation;Difficulty sequencing;Requires verbal cues General Comments: able to state name, [facility],  year 2022; guessed februrary for month. answering questions with short  responses, joking appropriately at time, but difficult to get 'serious' answer out of. apparent very delayed processing, following simple commands <25% of session. Difficult to determine true cognitive impairment vs fatigue vs delirium      Exercises      General Comments General comments (skin integrity, edema, etc.): SpO2 99% on RA. Supine BP 105/57, HR 80; post-mobility BP 120/64. Pt unaware of bowel incontinence, dependent for pericare. Noted BUE swelling, pt performing minimal BUE ROM (hands/elbows) despite verbal/tactile cues and demonstration      Pertinent Vitals/Pain Pain Assessment: No/denies pain Faces Pain Scale: No hurt Pain Intervention(s): Monitored during session;Repositioned    Home Living                      Prior Function            PT Goals (current goals can now be found in the care plan section) Progress towards PT goals: Progressing toward goals (slowly)    Frequency    Min 3X/week      PT Plan Current plan remains appropriate    Co-evaluation              AM-PAC PT "6 Clicks" Mobility   Outcome Measure  Help needed turning from your back to your side while in a flat bed without using bedrails?: A Lot Help needed moving from lying on your back to sitting on the side of a flat bed without using bedrails?: A Lot Help needed moving to and from a bed to a chair (including a wheelchair)?: A Lot Help needed standing up from a chair using your arms (e.g., wheelchair or bedside chair)?: A Lot Help needed to walk in hospital room?: Total Help needed climbing 3-5 steps with a railing? : Total 6 Click Score: 10    End of Session Equipment Utilized During Treatment: Gait belt Activity Tolerance: Patient limited by fatigue;Other (comment) (pt limited by AMS) Patient left: in bed;with call bell/phone within reach;with bed alarm set Nurse Communication: Mobility status PT Visit Diagnosis: Other abnormalities of gait and mobility  (R26.89);Pain;Difficulty in walking, not elsewhere classified (R26.2)     Time: 1010-1037 PT Time Calculation (min) (ACUTE ONLY): 27 min  Charges:  $Therapeutic Activity: 23-37 mins                     Mabeline Caras, PT, DPT Acute Rehabilitation Services  Pager [phone] Office [place] 05/24/2021, 11:56 AM

## 2021-05-24 NOTE — NC FL2 (Signed)
[place] LEVEL OF CARE SCREENING TOOL     IDENTIFICATION  Patient Name: Roger Vazquez Birthdate: 1950/12/01 Sex: male Admission Date (Current Location): 05/18/2021  [facility] and [place] Number:  Herbalist and Address:  [facility] [place], [place] [address] [place], [place] [address]      Provider Number: [id number]  Attending Physician Name and Address:  Lind Covert, MD  Relative Name and Phone Number:  [name] [phone]    Current Level of Care: Hospital Recommended Level of Care: [place] Prior Approval Number:    Date Approved/Denied:   PASRR Number: [id number]  Discharge Plan: SNF    Current Diagnoses: Patient Active Problem List   Diagnosis Date Noted   Altered mental status 05/19/2021   Left foot infection    Alcohol abuse 05/16/2021   Erectile dysfunction 05/16/2021   Hydronephrosis 05/16/2021   Acute hepatitis C 05/16/2021   Diabetic retinopathy ([place]) 05/16/2021   History of hemodialysis 05/16/2021   Iron deficiency anemia 05/16/2021   Malignant neoplasm of upper lobe, left bronchus or lung ([place]) 05/16/2021   Noncompliance with medication regimen 05/16/2021   Non-small cell lung cancer ([place]) 05/16/2021   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   Vitreous hemorrhage, left eye ([place]) 05/16/2021   Cellulitis of left lower extremity    Gangrene ([place]) 04/27/2021   Diarrhea, unspecified 04/29/2019   Mild protein-calorie malnutrition ([place]) 08/20/2018   Fever    Infection of AV graft for dialysis ([place]) 08/05/2018   Symptomatic anemia 08/05/2018   Hypokalemia 08/05/2018   GERD (gastroesophageal reflux disease) 08/05/2018   Sepsis due to unspecified Staphylococcus ([place]) 08/02/2018   ESRD (end stage renal disease) ([place])    Bacteremia due to methicillin susceptible Staphylococcus aureus (MSSA) 07/28/2018   Sepsis ([place]) 07/27/2018   Headache 01/14/2017   Fluid overload, unspecified  12/13/2015   Anemia in chronic kidney disease 12/09/2015   Chronic kidney disease, stage 5 ([place]) 12/09/2015   Coagulation defect, unspecified ([place]) 12/09/2015   Encounter for adjustment and management of vascular access device 12/09/2015   Other secondary pulmonary hypertension ([place]) 12/09/2015   Pruritus, unspecified 12/09/2015   Secondary hyperparathyroidism of renal origin ([place]) 12/09/2015   Shortness of breath 12/09/2015   Abdominal pain, other specified site 10/07/2011   Nausea and vomiting 10/07/2011   VITAMIN D DEFICIENCY 03/19/2008   FRACTURE, TIBIA 02/09/2008   HTN (hypertension) 11/25/2007   HEPATITIS C 05/27/2007   Type 2 diabetes mellitus with chronic kidney disease on chronic dialysis ([place]) 05/27/2007   ERECTILE DYSFUNCTION 05/27/2007   ABUSE, ALCOHOL, IN REMISSION 05/27/2007   ABUSE, OPIOID, IN REMISSION 05/27/2007   ABUSE, COCAINE, IN REMISSION 05/27/2007    Orientation RESPIRATION BLADDER Height & Weight     Self  Normal Continent Weight: 128 lb 15.5 oz (58.5 kg) Height:  5\' 6"  (167.6 cm)  BEHAVIORAL SYMPTOMS/MOOD NEUROLOGICAL BOWEL NUTRITION STATUS      Incontinent Diet (Please see discharge summary)  AMBULATORY STATUS COMMUNICATION OF NEEDS Skin   Limited Assist Verbally Other (Comment) (amputation toe left, Incision closed foot left, compression wrap,clean,dry,intact,dressing in place,incision closed foot left,wound incision open or dehiced other toe Left gangrene 2nd toe)                       Personal Care Assistance Level of Assistance  Bathing, Feeding, Dressing Bathing Assistance: Limited assistance Feeding assistance: Limited assistance Dressing Assistance: Limited assistance     Functional Limitations Info  Sight, Hearing, Speech Sight Info: Impaired Hearing Info: Impaired Speech Info: Adequate    SPECIAL CARE FACTORS FREQUENCY  PT (By licensed PT), OT (By licensed OT)     PT Frequency: 5x min weekly OT Frequency: 5x min weekly             Contractures Contractures Info: Not present    Additional Factors Info  Code Status, Allergies, Insulin Sliding Scale Code Status Info: FULL Allergies Info: Sildenafil   Insulin Sliding Scale Info: Insulin aspart (novoLOG) injection 0-9 units 3 times daily with meals       Current Medications (05/24/2021):  This is the current hospital active medication list Current Facility-Administered Medications  Medication Dose Route Frequency Provider Last Rate Last Admin   (feeding supplement) PROSource Plus liquid 30 mL  30 mL Oral BID BM Elam Dutch, MD   30 mL at 05/23/21 1320   0.9 %  sodium chloride infusion   Intravenous PRN Elam Dutch, MD   Stopped at 05/22/21 2314   0.9 %  sodium chloride infusion  100 mL Intravenous PRN Valentina Gu, NP       0.9 %  sodium chloride infusion  100 mL Intravenous PRN Valentina Gu, NP       acetaminophen (TYLENOL) tablet 500 mg  500 mg Oral Q6H Elam Dutch, MD   500 mg at 05/24/21 0504   alteplase (CATHFLO ACTIVASE) injection 2 mg  2 mg Intracatheter Once PRN Valentina Gu, NP       aspirin chewable tablet 81 mg  81 mg Oral Daily Elam Dutch, MD   81 mg at 05/24/21 0910   calcium acetate (PHOSLO) capsule 667 mg  667 mg Oral TID WC Elam Dutch, MD   667 mg at 05/24/21 0910   clopidogrel (PLAVIX) tablet 75 mg  75 mg Oral Daily Elam Dutch, MD   75 mg at 05/24/21 0910   [START ON 05/26/2021] Darbepoetin Alfa (ARANESP) injection 25 mcg  25 mcg Intravenous Q Fri-HD Fields, Jessy Oto, MD       doxercalciferol (HECTOROL) injection 1 mcg  1 mcg Intravenous Q M,W,F-HD Elam Dutch, MD   1 mcg at 05/22/21 1052   feeding supplement (GLUCERNA SHAKE) (GLUCERNA SHAKE) liquid 237 mL  237 mL Oral TID BM Elam Dutch, MD   237 mL at 05/23/21 0952   heparin injection 1,000 Units  1,000 Units Dialysis PRN Valentina Gu, NP       heparin injection 5,000 Units  5,000 Units Subcutaneous Q8H Elam Dutch, MD   5,000 Units at 05/24/21 0504   hydrALAZINE (APRESOLINE) tablet 50 mg  50 mg Oral Q8H Elam Dutch, MD   50 mg at 05/24/21 0504   insulin aspart (novoLOG) injection 0-9 Units  0-9 Units Subcutaneous TID WC Espinoza, Alejandra, DO   1 Units at 05/23/21 1654   lidocaine (PF) (XYLOCAINE) 1 % injection 5 mL  5 mL Intradermal PRN Valentina Gu, NP       lidocaine-prilocaine (EMLA) cream 1 application  1 application Topical PRN Valentina Gu, NP       metoprolol tartrate (LOPRESSOR) injection 2-5 mg  2-5 mg Intravenous Q2H PRN Elam Dutch, MD       multivitamin (RENA-VIT) tablet 1 tablet  1 tablet Oral QHS Elam Dutch, MD   1 tablet at 05/23/21 2139   nutrition supplement (JUVEN) (JUVEN) powder packet 1 packet  1 packet  Oral BID BM Elam Dutch, MD   1 packet at 05/23/21 0941   ondansetron [facility]) injection 4 mg  4 mg Intravenous Q6H PRN Elam Dutch, MD       oxyCODONE (Oxy IR/ROXICODONE) immediate release tablet 2.5 mg  2.5 mg Oral Q4H PRN Eppie Gibson, MD       pentafluoroprop-tetrafluoroeth Landry Dyke) aerosol 1 application  1 application Topical PRN Valentina Gu, NP       senna (SENOKOT) tablet 8.6 mg  1 tablet Oral Daily Wells Guiles, DO         Discharge Medications: Please see discharge summary for a list of discharge medications.  Relevant Imaging Results:  Relevant Lab Results:   Additional Information [phone] HD Jory Sims, [place], [place] Alzena Gerber, [place]

## 2021-05-24 NOTE — Progress Notes (Addendum)
  Progress Note    05/24/2021 7:41 AM 2 Days Post-Op  Subjective:  more comfortable and seems to be more alert and oriented   Vitals:   05/24/21 0504 05/24/21 0735  BP: (!) 148/68   Pulse:  79  Resp:  11  Temp:    SpO2:  100%   Physical Exam: Cardiac:  regular Lungs:  non labored Incisions:  left TMA dressings removed. No bleeding or drainage. Posterior flap with a little duskiness in mid- medial aspect but overall looks okay. Staples intact. Clean dressings applied  Extremities:  well perfused and warm Neurologic: alert and coherent  CBC    Component Value Date/Time   WBC 14.0 (H) 05/24/2021 0222   RBC 2.89 (L) 05/24/2021 0222   HGB 9.9 (L) 05/24/2021 0222   HCT 30.3 (L) 05/24/2021 0222   PLT 313 05/24/2021 0222   MCV 104.8 (H) 05/24/2021 0222   MCH 34.3 (H) 05/24/2021 0222   MCHC 32.7 05/24/2021 0222   RDW 15.3 05/24/2021 0222   LYMPHSABS 1.3 05/18/2021 2306   MONOABS 1.2 (H) 05/18/2021 2306   EOSABS 0.0 05/18/2021 2306   BASOSABS 0.1 05/18/2021 2306    BMET    Component Value Date/Time   NA 135 05/24/2021 0222   K 4.3 05/24/2021 0222   CL 98 05/24/2021 0222   CO2 24 05/24/2021 0222   GLUCOSE 147 (H) 05/24/2021 0222   BUN 61 (H) 05/24/2021 0222   CREATININE 7.69 (H) 05/24/2021 0222   CALCIUM 9.0 05/24/2021 0222   CALCIUM 7.1 (L) 08/01/2018 0944   GFRNONAA 7 (L) 05/24/2021 0222   GFRAA 5 (L) 08/11/2018 0858    INR    Component Value Date/Time   INR 1.1 05/18/2021 2306     Intake/Output Summary (Last 24 hours) at 05/24/2021 0741 Last data filed at 05/24/2021 0305 Gross per 24 hour  Intake 465 ml  Output 419 ml  Net 46 ml     Assessment/Plan:  70 y.o. male is s/p left TMA 2 Days Post-Op   L TMA appears viable Daily dressing changes Leukocytosis trending down 14 Afebrile okay to d/c antibiotics Less confused this morning PT/ OT recommending SNF Heel weight bearing only with Darco shoe   DVT prophylaxis:  sq heparin   Karoline Caldwell, PA-C [facility] [phone] 05/24/2021 7:41 AM  Still confused.  Will take several weeks to determine if TMA will heal. Dry dressing daily Heel wt bearing Will arrange follow up in one month Call if questions.  Ruta Hinds, MD [facility] [place] Office: [phone]

## 2021-05-24 NOTE — Progress Notes (Signed)
Roger Vazquez Progress Note   Subjective: Seen in room. Appears more alert today. Still oriented only  to self, however following commands and verbally interactive. Denies pain.    Objective Vitals:   05/24/21 0504 05/24/21 0735 05/24/21 0751 05/24/21 0800  BP: (!) 148/68  124/69   Pulse:  79 82 77  Resp:  11 14 11   Temp:   97.6 F (36.4 C)   TempSrc:   Oral   SpO2:  100% 99% 97%  Weight:      Height:       Physical Exam General: Chronically ill elderly male in NAD Neuro: More lethargic, oriented X 1 Heart:S1,S2  Tachy. ST with PACS on monitor. 2/6 systolic M. Lungs: CTAB No WOB Abdomen: NABS Extremities: Drsg intact L foot. No edema.  Dialysis Access: AVG +T/B   Additional Objective Labs: Basic Metabolic Panel: Recent Labs  Lab 05/21/21 0135 05/22/21 0117 05/23/21 0509 05/24/21 0222  NA 136 133* 132* 135  K 3.4* 3.7 4.8 4.3  CL 99 97* 94* 98  CO2 23 21* 23 24  GLUCOSE 67* 109* 248* 147*  BUN 38* 44* 38* 61*  CREATININE 8.29* 9.47* 6.03* 7.69*  CALCIUM 8.8* 9.0 9.0 9.0  PHOS 3.9 4.0 4.9*  --    Liver Function Tests: Recent Labs  Lab 05/18/21 2306 05/19/21 1741 05/21/21 0135 05/22/21 0117 05/23/21 0509  AST 42*  --   --   --   --   ALT 15  --   --   --   --   ALKPHOS 86  --   --   --   --   BILITOT 1.1  --   --   --   --   PROT 8.7*  --   --   --   --   ALBUMIN 3.5   < > 2.8* 2.6* 2.8*   < > = values in this interval not displayed.   No results for input(s): LIPASE, AMYLASE in the last 168 hours. CBC: Recent Labs  Lab 05/18/21 2306 05/19/21 0739 05/20/21 0220 05/21/21 0135 05/22/21 0117 05/23/21 0509 05/24/21 0222  WBC 18.9*   < > 15.0* 16.0* 13.6* 15.5* 14.0*  NEUTROABS 16.2*  --   --   --   --   --   --   HGB 11.0*   < > 9.1* 9.8* 10.2* 10.4* 9.9*  HCT 32.3*   < > 27.1* 29.3* 30.8* 31.6* 30.3*  MCV 103.5*   < > 103.8* 102.1* 102.7* 102.9* 104.8*  PLT 293   < > 261 271 262 312 313   < > = values in this interval not  displayed.   Blood Culture    Component Value Date/Time   SDES BLOOD LEFT FOREARM 05/18/2021 1955   SDES BLOOD LEFT HAND 05/18/2021 1955   SPECREQUEST  05/18/2021 1955    BOTTLES DRAWN AEROBIC AND ANAEROBIC Blood Culture adequate volume   SPECREQUEST  05/18/2021 1955    BOTTLES DRAWN AEROBIC AND ANAEROBIC Blood Culture adequate volume   CULT  05/18/2021 1955    NO GROWTH 5 DAYS Performed at [facility], [place] [address], [place], [place] [address]    CULT  05/18/2021 1955    NO GROWTH 5 DAYS Performed at [facility] [address], [place], [place] [address]    REPTSTATUS 05/24/2021 FINAL 05/18/2021 1955   REPTSTATUS 05/24/2021 FINAL 05/18/2021 1955    Cardiac Enzymes: Recent Labs  Lab 05/19/21 0557  CKTOTAL 430*   CBG: Recent Labs  Lab 05/23/21 0758 05/23/21 1202 05/23/21 1637 05/23/21 2115 05/24/21 0749  GLUCAP 225* 234* 143* 157* 134*   Iron Studies: No results for input(s): IRON, TIBC, TRANSFERRIN, FERRITIN in the last 72 hours. @lablastinr3 @ Studies/Results: No results found. Medications:  sodium chloride Stopped (05/22/21 2314)   sodium chloride     sodium chloride      (feeding supplement) PROSource Plus  30 mL Oral BID BM   acetaminophen  500 mg Oral Q6H   aspirin  81 mg Oral Daily   calcium acetate  667 mg Oral TID WC   clopidogrel  75 mg Oral Daily   [START ON 05/26/2021] darbepoetin (ARANESP) injection - DIALYSIS  25 mcg Intravenous Q Fri-HD   doxercalciferol  1 mcg Intravenous Q M,W,F-HD   feeding supplement (GLUCERNA SHAKE)  237 mL Oral TID BM   heparin  5,000 Units Subcutaneous Q8H   hydrALAZINE  50 mg Oral Q8H   insulin aspart  0-9 Units Subcutaneous TID WC   multivitamin  1 tablet Oral QHS   nutrition supplement (JUVEN)  1 packet Oral BID BM   senna  1 tablet Oral Daily     Dialysis Orders: [place] MWF 3hr, 400/Autoflow 1.5, 60.kg, 2K/3Ca, AVG,  --Heparin 2,000 units IV TIW - Hectoral 75mcg IV q HD - Mircera 43mcg IV q 2  weeks - last 7/13 -Venofer 100 q. dialysis last dose to be given 8/10. Hold in setting of sepsis.    Assessment/Plan: 1. Gangrenous L Foot S/P TMA 05/22/21: POD 1. Per primary. ABX completed. Per primary/VVS.  2. AMS: Multifactorial. He has baseline dementia, H/O polysubstance abuse, sepsis from foot wound. Per primary  3. ESRD -MWF. Next HD 05/25/2021 due to staffing issues in HD unit. No heparin.  4. Anemia - HGB 9.9. Aranesp 25 mcg IV 05/20/21. Can resume Fe Load.  5. Secondary hyperparathyroidism -Ca 10 decrease hectorol to 1 mcg IV TIW. PO4 at goal. 6. HTN/volume - Issues with tachycardia during HD briefly converted to AFIB then back to ST. Appeared dry, given volume, ran even Net UF 50cc. No evidence of volume excess. Minimal UF with HD 05/25/2021.  7. Nutrition - Renal Diet. Low albumin. Add protein supps. 8. DMT2-No diabetic meds on OP med list. Per primary.  Roger Vazquez H. Roger Provencal NP-C 05/24/2021, 11:39 AM  [employer] [phone]

## 2021-05-24 NOTE — TOC Initial Note (Addendum)
Transition of Care [facility]) - Initial/Assessment Note    Patient Details  Name: Roger Vazquez MRN: [id number] Date of Birth: April 09, 1951  Transition of [facility]) CM/SW Contact:    Trula Ore, [place] Phone Number: 05/24/2021, 11:37 AM  Clinical Narrative:                  Update- CS[facility] patient is not [facility]. Patient will need to use medicare benefits for SNF placement. CSW spoke with Marya Amsler renal navigator who will call CSW tomorrow with HD information for patient. Patients sister gave CSW permissiont to get in touch with financial counseling to add [facility] insurance to patients chart. CSW emailed Janett Billow with financial counseling. CSW will continue to follow.  CSW received consult for possible SNF placement at time of discharge. CSW spoke with patients sister [name] regarding PT recommendation of SNF placement at time of discharge. Patients sister reports patient comes from home alone. Patients sister expressed understanding of PT recommendation and is agreeable to SNF placement for patient at time of discharge. CSW discussed with patients sister possible barriers to SNF placement for patient. Patients sister understood.  Patient has received the COVID vaccines. Patients sister reports patient is with [facility]. Patients sister reports that patient also has [employer]. Patients sister is going to bring in copy of card tomorrow. Patients sister is okay for CSW to use [employer] medicare benefits for SNF placement. CSW discussed with patients sister that she will check with [facility] to see if we need to use [facility] medicare benefits for SNF placement. Patients sister agreeable to use which ever insurance needed for possible SNF placement. CSW called [facility] with [facility] and left voicemail. CSW awaiting callback.No further questions reported at this time. CSW to continue to follow and assist with discharge planning needs.   Expected Discharge Plan: Skilled Nursing  Facility Barriers to Discharge: Continued Medical Work up   Patient Goals and CMS Choice   CMS Medicare.gov Compare Post Acute Care list provided to:: Patient Represenative (must comment) (patients sister [name]) Choice offered to / list presented to : Sibling (Patients sister [name])  Expected Discharge Plan and Services Expected Discharge Plan: [place] In-house Referral: Clinical Social Work     Living arrangements for the past 2 months: Single Family Home                                      Prior Living Arrangements/Services Living arrangements for the past 2 months: Single Family Home Lives with:: Self Patient language and need for interpreter reviewed:: Yes Do you feel safe going back to the place where you live?: No   SNF  Need for Family Participation in Patient Care: Yes (Comment) Care giver support system in place?: Yes (comment)   Criminal Activity/Legal Involvement Pertinent to Current Situation/Hospitalization: No - Comment as needed  Activities of Daily Living      Permission Sought/Granted Permission sought to share information with : Case Manager, Family Supports, Customer service manager Permission granted to share information with : No  Share Information with NAME: Patient only oriented to self spoke with sister [name]  Permission granted to share info w AGENCY: patient only oriented to self spoke with sister [name], [place]  Permission granted to share info w Relationship: patient only oriented to self spoke with sister [name], sister  Permission granted to share info w Contact  Information: patient only oriented to self spoke with [name] patients sister [phone]  Emotional Assessment       Orientation: : Oriented to Self Alcohol / Substance Use: Not Applicable Psych Involvement: No (comment)  Admission diagnosis:  Altered mental status [R41.82] Left foot infection [L08.9] Sepsis, due to unspecified organism, unspecified  whether acute organ dysfunction present [facility]) [A41.9] Patient Active Problem List   Diagnosis Date Noted   Altered mental status 05/19/2021   Left foot infection    Alcohol abuse 05/16/2021   Erectile dysfunction 05/16/2021   Hydronephrosis 05/16/2021   Acute hepatitis C 05/16/2021   Diabetic retinopathy ([place]) 05/16/2021   History of hemodialysis 05/16/2021   Iron deficiency anemia 05/16/2021   Malignant neoplasm of upper lobe, left bronchus or lung ([place]) 05/16/2021   Noncompliance with medication regimen 05/16/2021   Non-small cell lung cancer ([place]) 05/16/2021   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   Vitreous hemorrhage, left eye ([place]) 05/16/2021   Cellulitis of left lower extremity    Gangrene ([place]) 04/27/2021   Diarrhea, unspecified 04/29/2019   Mild protein-calorie malnutrition ([place]) 08/20/2018   Fever    Infection of AV graft for dialysis ([place]) 08/05/2018   Symptomatic anemia 08/05/2018   Hypokalemia 08/05/2018   GERD (gastroesophageal reflux disease) 08/05/2018   Sepsis due to unspecified Staphylococcus ([place]) 08/02/2018   ESRD (end stage renal disease) ([place])    Bacteremia due to methicillin susceptible Staphylococcus aureus (MSSA) 07/28/2018   Sepsis ([place]) 07/27/2018   Headache 01/14/2017   Fluid overload, unspecified 12/13/2015   Anemia in chronic kidney disease 12/09/2015   Chronic kidney disease, stage 5 ([place]) 12/09/2015   Coagulation defect, unspecified ([place]) 12/09/2015   Encounter for adjustment and management of vascular access device 12/09/2015   Other secondary pulmonary hypertension ([place]) 12/09/2015   Pruritus, unspecified 12/09/2015   Secondary hyperparathyroidism of renal origin ([place]) 12/09/2015   Shortness of breath 12/09/2015   Abdominal pain, other specified site 10/07/2011   Nausea and vomiting 10/07/2011   VITAMIN D DEFICIENCY 03/19/2008   FRACTURE, TIBIA 02/09/2008   HTN (hypertension) 11/25/2007   HEPATITIS C 05/27/2007   Type 2 diabetes  mellitus with chronic kidney disease on chronic dialysis ([place]) 05/27/2007   ERECTILE DYSFUNCTION 05/27/2007   ABUSE, ALCOHOL, IN REMISSION 05/27/2007   ABUSE, OPIOID, IN REMISSION 05/27/2007   ABUSE, COCAINE, IN REMISSION 05/27/2007   PCP:  Clinic, [place]:   [facility] [place], [place] - [address] [address] Phone: [phone] Fax: [phone]     Social Determinants of Health (SDOH) Interventions    Readmission Risk Interventions No flowsheet data found.

## 2021-05-24 NOTE — Progress Notes (Addendum)
Family Medicine Teaching Service Daily Progress Note Intern Pager: [phone]  Patient name: Roger Vazquez Medical record number: [id number] Date of birth: Jul 28, 1951 Age: 70 y.o. Gender: male  Primary Care Provider: [facility] Consultants: VVS Code Status: Full  Pt Overview and Major Events to Date:  8/5- admitted 8/6- recurrent hypoglycemia 8/8- LLE transmetatarsal amputation  Assessment and Plan: Roger Vazquez is a 70 year old male with a history of ESRD on HD MWF, history of lung CA, PAD, HTN, diabetes, polysubstance abuse.  Gangrene of L Foot, s/p transmetatarsal amputation Post-op day 2. Afebrile. WBC downtrending. No further restlessness or agitation overnight. Pain seems well-controlled on current regimen but he is sedated after receiving a dose of 5mg  Oxy. -Per surgery, okay to discontinue antibiotics - Tylenol 500mg  q6h  - Decrease oxycodone dose to 2.5mg  q4 PRN - PT/OT - TOC for SNF placement  Altered Mental Status  Hx of Polysubstance Abuse Improving. No further episodes of agitation since receiving Ativan x1 yesterday. Last CIWA 3. RN yesterday had some concern for difficulty eating.  - Continue 1-to-1 telesitter - Continue delirium precautions - Can d/c CIWA monitoring - Soft diet, adjust per SLP recs - SLP Eval pending   ESRD on HD Scheduled for hospital dialysis today. -Dialysis per nephro -Continue renal vitamin and PhosLo  Urinary retention, resolved Subsequent bladder scans with <150cc urine.  Constipation One large BM yesterday. - Return to MiraLAX and Senna once daily  HTN -Continue home hydralazine 50mg  TID  DM2 Blood sugars 143-157. -Continue current regimen   FEN/GI: Soft diet PPx: Heparin Dispo:SNF in 2-3 days. Barriers include clinical improvement.   Subjective:  Roger Vazquez has no complaints this morning.  He is somewhat sedated today after receiving dose of oxycodone.  He says that his pain is well controlled.  He had a  bowel movement yesterday.  Objective: Temp:  [97.6 F (36.4 C)-98.7 F (37.1 C)] 97.6 F (36.4 C) (08/10 0751) Pulse Rate:  [79-97] 82 (08/10 0751) Resp:  [11-19] 14 (08/10 0751) BP: (119-155)/(57-77) 124/69 (08/10 0751) SpO2:  [98 %-100 %] 99 % (08/10 0751) Weight:  [58.5 kg] 58.5 kg (08/10 0405) Physical Exam: General: Very sleepy, NAD Cardiovascular: Irregular, transmitted fistula sounds Respiratory: Had nasal cannula on when I entered the room, removed these without issue.  Normal work of breathing on room air, no wheezes rales or rhonchi Abdomen: Soft, nontender, nondistended Extremities: Left foot with surgical dressing in place, recently examined by vascular surgeon, somewhat tender proximally but without erythema, streaking, edema. Photo per vascular:   Laboratory: Recent Labs  Lab 05/22/21 0117 05/23/21 0509 05/24/21 0222  WBC 13.6* 15.5* 14.0*  HGB 10.2* 10.4* 9.9*  HCT 30.8* 31.6* 30.3*  PLT 262 312 313   Recent Labs  Lab 05/18/21 2306 05/19/21 1741 05/22/21 0117 05/23/21 0509 05/24/21 0222  NA 135   < > 133* 132* 135  K 4.1   < > 3.7 4.8 4.3  CL 96*   < > 97* 94* 98  CO2 22   < > 21* 23 24  BUN 56*   < > 44* 38* 61*  CREATININE 10.69*   < > 9.47* 6.03* 7.69*  CALCIUM 9.2   < > 9.0 9.0 9.0  PROT 8.7*  --   --   --   --   BILITOT 1.1  --   --   --   --   ALKPHOS 86  --   --   --   --   ALT  15  --   --   --   --   AST 42*  --   --   --   --   GLUCOSE 161*   < > 109* 248* 147*   < > = values in this interval not displayed.    Imaging/Diagnostic Tests: No new imaging, tests  Eppie Gibson, MD 05/24/2021, 9:04 AM PGY-1, [facility] Intern pager: [phone], text pages welcome

## 2021-05-25 DIAGNOSIS — Z89439 Acquired absence of unspecified foot: Secondary | ICD-10-CM

## 2021-05-25 LAB — RENAL FUNCTION PANEL
Albumin: 2.4 g/dL — ABNORMAL LOW (ref 3.5–5.0)
Anion gap: 13 (ref 5–15)
BUN: 78 mg/dL — ABNORMAL HIGH (ref 8–23)
CO2: 24 mmol/L (ref 22–32)
Calcium: 8.9 mg/dL (ref 8.9–10.3)
Chloride: 97 mmol/L — ABNORMAL LOW (ref 98–111)
Creatinine, Ser: 9.25 mg/dL — ABNORMAL HIGH (ref 0.61–1.24)
GFR, Estimated: 6 mL/min — ABNORMAL LOW (ref >60)
Glucose, Bld: 191 mg/dL — ABNORMAL HIGH (ref 70–99)
Phosphorus: 3.7 mg/dL (ref 2.5–4.6)
Potassium: 5 mmol/L (ref 3.5–5.1)
Sodium: 134 mmol/L — ABNORMAL LOW (ref 135–145)

## 2021-05-25 LAB — CBC
HCT: 27.8 % — ABNORMAL LOW (ref 39.0–52.0)
Hemoglobin: 9.4 g/dL — ABNORMAL LOW (ref 13.0–17.0)
MCH: 34.4 pg — ABNORMAL HIGH (ref 26.0–34.0)
MCHC: 33.8 g/dL (ref 30.0–36.0)
MCV: 101.8 fL — ABNORMAL HIGH (ref 80.0–100.0)
Platelets: 314 10*3/uL (ref 150–400)
RBC: 2.73 MIL/uL — ABNORMAL LOW (ref 4.22–5.81)
RDW: 15.1 % (ref 11.5–15.5)
WBC: 12.1 10*3/uL — ABNORMAL HIGH (ref 4.0–10.5)
nRBC: 0 % (ref 0.0–0.2)

## 2021-05-25 LAB — GLUCOSE, CAPILLARY
Glucose-Capillary: 163 mg/dL — ABNORMAL HIGH (ref 70–99)
Glucose-Capillary: 166 mg/dL — ABNORMAL HIGH (ref 70–99)
Glucose-Capillary: 183 mg/dL — ABNORMAL HIGH (ref 70–99)
Glucose-Capillary: 161 mg/dL — ABNORMAL HIGH (ref 70–99)

## 2021-05-25 MED ORDER — POLYETHYLENE GLYCOL 3350 17 G PO PACK
17.0000 g | PACK | Freq: Two times a day (BID) | ORAL | Status: DC
  Filled 2021-05-25: qty 1

## 2021-05-25 MED ORDER — SENNA 8.6 MG PO TABS
2.0000 | ORAL_TABLET | Freq: Every day | ORAL | Status: DC
  Administered 2021-05-25: 17.2 mg via ORAL
  Filled 2021-05-25: qty 2

## 2021-05-25 MED ORDER — CEFAZOLIN SODIUM-DEXTROSE 2-4 GM/100ML-% IV SOLN
2.0000 g | INTRAVENOUS | Status: AC
  Filled 2021-05-25: qty 100

## 2021-05-25 NOTE — H&P (Addendum)
Chief Complaint: Patient was seen in consultation today for shuntogram with possible intervention and possible [facility] placement  Chief Complaint  Patient presents with   Weakness   at the request of Sabino Gasser NP   Referring Physician(s): Sabino Gasser NP   Supervising Physician: Markus Daft  Patient Status: [facility] - In-pt  History of Present Illness: PHU RECORD is a 70 y.o. male with ESRD on HD via right forearm graft that was placed on 11/27/2018. Patient underwent dialysis today with no problem; however, the right UE appeared to be swollen after the dialysis.   IR was requested for right AVG shuntogram and possible intervention.  Case was reviewed and approved by Dr. Anselm Pancoast.   Patient laying in bed, not in acute distress.  Patient repetitively says "yea ma'am" to questions, not able to tell his name or DOB.  ROS not obtained.   Past Medical History:  Diagnosis Date   Cancer ([place])    Lung   Diabetes mellitus    Hypertension    Renal disorder    MWF- [facility]    Past Surgical History:  Procedure Laterality Date   ABDOMINAL AORTOGRAM W/LOWER EXTREMITY Left 04/28/2021   Procedure: ABDOMINAL AORTOGRAM W/LOWER EXTREMITY;  Surgeon: Cherre Robins, MD;  Location: [place] CV LAB;  Service: Cardiovascular;  Laterality: Left;   AMPUTATION Left 05/04/2021   Procedure: LEFT SECOND TOE AMPUTATION;  Surgeon: Cherre Robins, MD;  Location: [place];  Service: Vascular;  Laterality: Left;   artiovenous graft placement Left    AV FISTULA PLACEMENT Right 11/27/2018   Procedure: INSERTION 4-7MM X 45CM GORETEX GRAFT RIGHT FOREARM ;  Surgeon: Rosetta Posner, MD;  Location: [place];  Service: Vascular;  Laterality: Right;   [place] REMOVAL Left 08/06/2018   Procedure: REMOVAL OF LEFT ARM GRAFT;  Surgeon: Rosetta Posner, MD;  Location: [place];  Service: Vascular;  Laterality: Left;   INSERTION OF DIALYSIS CATHETER Right 08/08/2018   Procedure: INSERTION OF TUNNELED  DIALYSIS CATHETER;   Surgeon: Marty Heck, MD;  Location: [place];  Service: Vascular;  Laterality: Right;   IR REMOVAL TUN CV CATH W/O FL  02/19/2019   LUNG SURGERY     patient not if they removed part of lung. Was done at [place] Left 04/28/2021   Procedure: PERIPHERAL VASCULAR BALLOON ANGIOPLASTY;  Surgeon: Cherre Robins, MD;  Location: [place] CV LAB;  Service: Cardiovascular;  Laterality: Left;  popliteal and posterial tibial   TEE WITHOUT CARDIOVERSION N/A 07/31/2018   Procedure: TRANSESOPHAGEAL ECHOCARDIOGRAM (TEE);  Surgeon: Pixie Casino, MD;  Location: [facility] END[facility];  Service: Cardiovascular;  Laterality: N/A;   TRANSMETATARSAL AMPUTATION Left 05/22/2021   Procedure: LEFT TRANSMETATARSAL AMPUTATION;  Surgeon: Elam Dutch, MD;  Location: [place];  Service: Vascular;  Laterality: Left;    Allergies: Sildenafil  Medications: Prior to Admission medications   Medication Sig Start Date End Date Taking? Authorizing Provider  acetaminophen (TYLENOL) 500 MG tablet Take 1 tablet (500 mg total) by mouth every 6 (six) hours. Patient taking differently: Take 1,000 mg by mouth every 6 (six) hours as needed for mild pain or headache. 05/05/21  Yes Ezequiel Essex, MD  aspirin 81 MG tablet Take 81 mg by mouth daily.   Patient not taking: Reported on 05/19/2021    [provider]  calcium acetate (PHOSLO) 667 MG capsule Take 667 mg by mouth 3 (three) times daily with meals. Patient not taking:  Reported on 05/19/2021    [provider]  calcium carbonate (OS-CAL) 1250 (500 Ca) MG chewable tablet Chew 1 tablet by mouth daily as needed for heartburn. Patient not taking: Reported on 05/19/2021 12/06/16   [provider]  clopidogrel (PLAVIX) 75 MG tablet Take 1 tablet (75 mg total) by mouth daily. Patient not taking: Reported on 05/19/2021 05/05/21   Ezequiel Essex, MD  hydrALAZINE (APRESOLINE) 25 MG tablet Take 2 tablets (50 mg total) by mouth every 8  (eight) hours. Patient not taking: Reported on 11/20/2018 10/12/11 08/05/18  Robbie Lis, MD  insulin aspart (NOVOLOG) 100 UNIT/ML injection Before each meal 3 times a day, 140-199 - 2 units, 200-250 - 4 units, 251-299 - 6 units,  300-349 - 8 units,  350 or above 10 units. Dispense syringes and needles as needed, Ok to switch to PEN if approved. Substitute to any brand approved. DX DM2, Code E11.65 Patient not taking: Reported on 05/19/2021 08/02/18   Thurnell Lose, MD  insulin glargine (LANTUS) 100 UNIT/ML injection Inject 0.15 mLs (15 Units total) into the skin daily. Dispense insulin pen if approved, if not dispense as needed syringes and needles for 1 month supply. Can switch to Levemir. Diagnosis E 11.65. Patient not taking: Reported on 05/19/2021 08/02/18   Thurnell Lose, MD  lidocaine-prilocaine (EMLA) cream Apply 1 application topically as needed. Patient not taking: Reported on 05/19/2021 04/28/21   [provider]  Multiple Vitamin (MULITIVITAMIN WITH MINERALS) TABS Take 1 tablet by mouth daily. Patient not taking: Reported on 05/19/2021 10/12/11   Robbie Lis, MD  oxyCODONE-acetaminophen (PERCOCET/ROXICET) 5-325 MG tablet Take 1-2 tablets by mouth every 4 (four) hours as needed for up to 18 doses for moderate pain. Patient not taking: Reported on 05/19/2021 05/05/21   Ezequiel Essex, MD  polyethylene glycol (MIRALAX / GLYCOLAX) 17 g packet Take 17 g by mouth 2 (two) times daily. Patient not taking: Reported on 05/19/2021 05/05/21   Ezequiel Essex, MD  senna (SENOKOT) 8.6 MG TABS tablet Take 1 tablet (8.6 mg total) by mouth daily. Patient not taking: Reported on 05/19/2021 05/05/21   Ezequiel Essex, MD     History reviewed. No pertinent family history.  Social History   Socioeconomic History   Marital status: Divorced    Spouse name: Not on file   Number of children: Not on file   Years of education: Not on file   Highest education level: Not on file  Occupational History    Not on file  Tobacco Use   Smoking status: Former    Packs/day: 0.50    Years: 10.00    Pack years: 5.00    Types: Cigarettes    Quit date: 2001    Years since quitting: 21.6   Smokeless tobacco: Former    Quit date: 08/01/2015  Vaping Use   Vaping Use: Never used  Substance and Sexual Activity   Alcohol use: Yes    Alcohol/week: 2.0 standard drinks    Types: 2 Cans of beer per week   Drug use: Yes    Types: Marijuana    Comment: last time 11/19/2018   Sexual activity: Not on file    Comment: Pt uses vicodin  Other Topics Concern   Not on file  Social History Narrative   Not on file   Social Determinants of Health   Financial Resource Strain: Not on file  Food Insecurity: Not on file  Transportation Needs: Not on file  Physical Activity: Not  on file  Stress: Not on file  Social Connections: Not on file     Review of Systems: A 12 point ROS discussed and pertinent positives are indicated in the HPI above.  All other systems are negative.   Vital Signs: BP 135/64 (BP Location: Left Arm)   Pulse 100   Temp 98.9 F (37.2 C) (Oral)   Resp 14   Ht 5\' 6"  (1.676 m)   Wt 132 lb 4.4 oz (60 kg)   SpO2 98%   BMI 21.35 kg/m   Physical Exam Vitals reviewed.  Constitutional:      General: He is not in acute distress.    Appearance: He is not ill-appearing.  HENT:     Head: Normocephalic and atraumatic.     Mouth/Throat:     Mouth: Mucous membranes are moist.  Cardiovascular:     Rate and Rhythm: Normal rate and regular rhythm.     Pulses: Normal pulses.     Heart sounds: Normal heart sounds.  Pulmonary:     Effort: Pulmonary effort is normal.     Breath sounds: Normal breath sounds.  Abdominal:     General: Abdomen is flat. Bowel sounds are normal.     Palpations: Abdomen is soft.  Musculoskeletal:        General: Swelling present.     Comments: Swelling noted from right fingers to shoulder, skin is warmer than left arm. No pitting edema. Patient withdraws  when right arm is touched.   Skin:    General: Skin is warm and dry.     Coloration: Skin is not jaundiced or pale.  Neurological:     Mental Status: He is alert. He is disoriented.       Imaging: CT Head Wo Contrast  Result Date: 05/19/2021 CLINICAL DATA:  Head trauma EXAM: CT HEAD WITHOUT CONTRAST TECHNIQUE: Contiguous axial images were obtained from the base of the skull through the vertex without intravenous contrast. COMPARISON:  None. FINDINGS: Brain: There is no mass, hemorrhage or extra-axial collection. There is generalized atrophy without lobar predilection. Hypodensity of the white matter is most commonly associated with chronic microvascular disease. Vascular: Atherosclerotic calcification of the internal carotid arteries at the skull base. No abnormal hyperdensity of the major intracranial arteries or dural venous sinuses. Skull: The visualized skull base, calvarium and extracranial soft tissues are normal. Sinuses/Orbits: No fluid levels or advanced mucosal thickening of the visualized paranasal sinuses. No mastoid or middle ear effusion. The orbits are normal. IMPRESSION: Chronic microvascular ischemia and generalized atrophy without acute intracranial abnormality. Electronically Signed   By: Ulyses Jarred M.D.   On: 05/19/2021 02:11   MR FOOT LEFT WO CONTRAST  Result Date: 05/19/2021 CLINICAL DATA:  Weakness. Left foot swelling. Second toe amputation EXAM: MRI OF THE LEFT FOOT WITHOUT CONTRAST TECHNIQUE: Multiplanar, multisequence MR imaging of the left forefoot was performed. No intravenous contrast was administered. COMPARISON:  Radiographs 05/18/2021 FINDINGS: Bones/Joint/Cartilage Amputation of the second toe noted with a tapered and mildly irregular distal margin of the second metatarsal distally with removal of the metatarsal head. Low-level edema in the second metatarsal distally adjacent to a 2.0 by 0.2 by 0.6 cm (volume = 0.1 cm^3) fluid signal intensity collection in the soft  tissues. There is adjacent edema in the flap of soft tissues along the amputation site without obvious gas tracking in the soft tissues. There is low-grade edema along the base of the distal phalanx of the great toe and along the medial margin  of the distal head of the proximal phalanx as shown on image 10 series 9. Low-grade edema in the head of the third metatarsal. Mild degenerative marrow edema at the articulation between the first metatarsal head in the medial sesamoid. Ligaments The Lisfranc ligament is somewhat indistinct but probably intact. Muscles and Tendons Diffuse edema along the plantar and dorsal musculature in the foot, probably neurogenic but technically nonspecific. Soft tissues Subcutaneous edema along the medial and dorsal foot, a partially tracking into the great toe. No drainable abscess identified. IMPRESSION: 1. Along the irregular distal margin of the second metatarsal metaphysis there is low-grade marrow edema along with a small (0.1 cubic cm) fluid collection, as well as overlying soft tissue edema tracking along the flap and the dorsal soft tissue defect. Given the irregularity of the distal metatarsal, infectious process along the metatarsal is difficult to exclude. 2. Low-grade marrow edema along the proximal interphalangeal joint of the great toe and in the head of the third metatarsal is not entirely specific and could be related to arthropathy, with early osteomyelitis a less likely differential diagnostic consideration. 3. Diffuse muscular edema, probably neurogenic, myositis less likely. Subcutaneous edema in the foot is observed and cellulitis is not excluded. Electronically Signed   By: Van Clines M.D.   On: 05/19/2021 10:04   PERIPHERAL VASCULAR CATHETERIZATION  Result Date: 05/01/2021 DATE OF SERVICE: 04/28/2021  PATIENT:  Dena Billet  70 y.o. male  PRE-OPERATIVE DIAGNOSIS:  Atherosclerosis of native arteries of left lower extremity causing gangrene   POST-OPERATIVE DIAGNOSIS:  Same  PROCEDURE:  1) US guided right common femoral artery access 2) Aortogram 3) left angiogram with third order cannulation (34mL total contrast) 4) left popliteal angioplasty (5x56mm Mustang) 5) left posterior tibial angioplasty (3x142mm Sterling) 6) Conscious sedation (64 minutes)  SURGEON:  Surgeon(s) and Role:    * Cherre Robins, MD - Primary  ASSISTANT: none  An assistant was required to facilitate exposure and expedite the case.  ANESTHESIA:   local and IV sedation  EBL: min  BLOOD ADMINISTERED:none  DRAINS: none  LOCAL MEDICATIONS USED:  LIDOCAINE  SPECIMEN:  none  COUNTS: confirmed correct.  TOURNIQUET:  none  PATIENT DISPOSITION:  PACU - hemodynamically stable.  Delay start of Pharmacological VTE agent (>24hrs) due to surgical blood loss or risk of bleeding: no  INDICATION FOR PROCEDURE: SEHAJ MCENROE is a 70 y.o. male with ulceration of left toes in setting of non-invasive evidence of peripheral arterial disease. After careful discussion of risks, benefits, and alternatives the patient was offered angiography with intervention. We specifically discussed access site complications. The patient understood and wished to proceed.  OPERATIVE FINDINGS: Unremarkable aortogram  Left lower extremity angiogram EIA / CFA / PFA / SFA - no flow limiting disease >65% popliteal stenosis x 2 behind the knee Severe tibial disease Anterior tibial artery is dominant and courses to the foot PT heavily diseased but appears patent to the ankle Severe pedal disease Successful endovascular intervention - 0% residual in popliteal and proximal PT  DESCRIPTION OF PROCEDURE: After identification of the patient in the pre-operative holding area, the patient was transferred to the operating room. The patient was positioned supine on the operating room table. Anesthesia was induced. The groins was prepped and draped in standard fashion. A surgical pause was performed confirming correct patient,  procedure, and operative location.  The right groin was anesthetized with subcutaneous injection of 1% lidocaine. Using ultrasound guidance, the right common femoral artery was accessed with  micropuncture technique. Fluoroscopy was used to confirm cannulation over the femoral head. Sheathogram was not performed. The 25F sheath was upsized to 5F.  An 035 glidewire advantage was advanced into the distal aorta. Over the wire an omni flush catheter was advanced to the level of L2. Aortogram was performed - see above for details.  The left common iliac artery was selected with the 035 glidewire advantage. The wire was advanced into the common femoral artery. Over the wire the omni flush catheter was advanced into the external iliac artery. Selective angiography was performed - see above for details.  The decision was made to intervene. The patient was heparinized with 6000 units of heparin. The sheath was exchanged for a 64F x 55cm sheath. Selective angiography of the left lower extremity was performed prior to intervention. The lesions were treated with angioplasty. The popliteal lesion was treated with a 5x11mm Mustang. The PT lesion was treated with a 3x140mm Sterling.  Completion angiography revealed: Resolution of stenosis  A perclose device was used to close the arteriotomy. Hemostasis was excellent upon completion.  Conscious sedation was administered with the use of IV fentanyl and midazolam under continuous physician and nurse monitoring.  Heart rate, blood pressure, and oxygen saturation were continuously monitored.  Total sedation time was 64 minutes  Upon completion of the case instrument and sharps counts were confirmed correct. The patient was transferred to the PACU in good condition. I was present for all portions of the procedure.  PLAN: ASA 81mg  PO QD indefnitely. Plavix 75mg  PO QD x 3 months. High intensity statin therapy indefnitely. Will check access site tomorrow.   [name]. Stanford Breed, MD [facility] Phone Number: [phone] 04/28/2021 3:49 PM   DG Chest Port 1 View  Result Date: 05/18/2021 CLINICAL DATA:  Sepsis EXAM: PORTABLE CHEST 1 VIEW COMPARISON:  04/27/2021 FINDINGS: Single frontal view of the chest demonstrates a stable cardiac silhouette. Chronic elevation of the left hemidiaphragm. No airspace disease, effusion, or pneumothorax. Vascular stent left subclavian distribution. IMPRESSION: 1. Stable chest, no acute process. Electronically Signed   By: Randa Ngo M.D.   On: 05/18/2021 23:45   DG Chest Port 1 View  Result Date: 04/27/2021 CLINICAL DATA:  Questionable sepsis.  LEFT toe infection. EXAM: PORTABLE CHEST 1 VIEW COMPARISON:  October of 2019. FINDINGS: Image slightly rotated to the RIGHT. Accounting for this cardiomediastinal contours and hilar structures are normal. Minimal retrocardiac opacity on the LEFT with linear features. Mild elevation of the LEFT hemidiaphragm may be due to low lung volumes similar to study from Feb 14, 2011. No sign of pneumothorax. No visible pleural effusion on frontal radiograph. On limited assessment there is no acute skeletal process. IMPRESSION: 1. No acute cardiopulmonary disease. 2. Scarring or atelectasis at the LEFT lung base. 3. Signs of LEFT axillary stenting as on previous imaging. Electronically Signed   By: Zetta Bills M.D.   On: 04/27/2021 14:35   DG Foot Complete Left  Result Date: 05/18/2021 CLINICAL DATA:  Left foot infection. EXAM: LEFT FOOT - COMPLETE 3+ VIEW COMPARISON:  Left radiograph dated 04/27/2021. FINDINGS: There is no acute fracture or dislocation. Status post amputation of the distal second metatarsal. No periosteal elevation or bone erosion to suggest acute osteomyelitis. There is diffuse subcutaneous edema. Vascular calcifications noted. IMPRESSION: 1. No acute fracture or dislocation. Amputation of the distal second metatarsal. 2. Diffuse subcutaneous edema. Electronically Signed    By: Anner Crete M.D.   On:  05/18/2021 23:47   DG Foot Complete Left  Result Date: 04/27/2021 CLINICAL DATA:  Gangrene to the first and second toes of the left foot, diabetes EXAM: LEFT FOOT - COMPLETE 3+ VIEW COMPARISON:  None. FINDINGS: No fracture or dislocation. No periosteal reaction or osseous erosions. No suspicious focal osseous lesions. No significant arthropathy. Mild soft tissue swelling in the distal left foot. Vascular calcifications throughout the soft tissues. No radiopaque foreign bodies. IMPRESSION: Mild distal left foot soft tissue swelling. No specific radiographic findings of osteomyelitis. Electronically Signed   By: Ilona Sorrel M.D.   On: 04/27/2021 13:15   VAS [place] ABI WITH/WO TBI  Result Date: 04/28/2021  LOWER EXTREMITY DOPPLER STUDY Patient Name:  RENDON HOWELL  Date of Exam:   04/27/2021 Medical Rec #: [id number]         Accession #:    [id number] Date of Birth: 02/05/1951         Patient Gender: M Patient Age:   70Y Exam Location:  [facility] Procedure:      VAS [place] ABI WITH/WO TBI Referring Phys: [id number] Karoline Caldwell --------------------------------------------------------------------------------  Indications: Rest pain, gangrene, and peripheral artery disease. High Risk Factors: Hypertension, Diabetes.  Limitations: Today's exam was limited due to an open wound. Comparison Study: 07/05/20- bilateral ABI noncompressible Performing Technologist: Maudry Mayhew MHA, RVT, RDCS, RDMS  Examination Guidelines: A complete evaluation includes at minimum, Doppler waveform signals and systolic blood pressure reading at the level of bilateral brachial, anterior tibial, and posterior tibial arteries, when vessel segments are accessible. Bilateral testing is considered an integral part of a complete examination. Photoelectric Plethysmograph (PPG) waveforms and toe systolic pressure readings are included as required and additional duplex testing as needed. Limited examinations  for reoccurring indications may be performed as noted.  ABI Findings: +---------+------------------+-----+----------+-------------------------------+ Right    Rt Pressure (mmHg)IndexWaveform  Comment                         +---------+------------------+-----+----------+-------------------------------+ Brachial                                  HD access- restricted extremity +---------+------------------+-----+----------+-------------------------------+ PTA      Noncompressible        monophasic                                +---------+------------------+-----+----------+-------------------------------+ DP       Noncompressible        monophasic                                +---------+------------------+-----+----------+-------------------------------+ Great Toe180               0.93 Abnormal                                  +---------+------------------+-----+----------+-------------------------------+ +---------+------------------+-----+----------+-----------------------+ Left     Lt Pressure (mmHg)IndexWaveform  Comment                 +---------+------------------+-----+----------+-----------------------+ Brachial 194                    biphasic                          +---------+------------------+-----+----------+-----------------------+  PTA      Noncompressible        monophasic                        +---------+------------------+-----+----------+-----------------------+ DP       Noncompressible        monophasic                        +---------+------------------+-----+----------+-----------------------+ Great Toe0                 0.00           No discernable waveform +---------+------------------+-----+----------+-----------------------+ +-------+---------------+-----------+---------------+------------+ ABI/TBIToday's ABI    Today's TBIPrevious ABI   Previous TBI +-------+---------------+-----------+---------------+------------+ Right   Noncompressible0.93       Noncompressible0.59         +-------+---------------+-----------+---------------+------------+ Left   Noncompressible0.00       Noncompressible0.62         +-------+---------------+-----------+---------------+------------+  Summary: Right: Resting right ankle-brachial index indicates noncompressible right lower extremity arteries, suggestive of medial arterial calcification. The right toe-brachial index is abnormal. Left: Resting left ankle-brachial index indicates noncompressible left lower extremity arteries, suggestive of medial arterial calcification. Absent left great toe waveform.  *See table(s) above for measurements and observations.  Electronically signed by Monica Martinez MD on 04/28/2021 at 2:42:51 PM.    Final    VAS [place] LOWER EXTREMITY VENOUS (DVT)  Result Date: 05/01/2021  Lower Venous DVT Study Patient Name:  SUNDIATA FERRICK  Date of Exam:   04/30/2021 Medical Rec #: [id number]         Accession #:    [id number] Date of Birth: 1951-06-30         Patient Gender: M Patient Age:   70Y Exam Location:  [facility] Procedure:      VAS [place] LOWER EXTREMITY VENOUS (DVT) Referring Phys: [id number] MARGARET E PRAY --------------------------------------------------------------------------------  Indications: Pain. Other Indications: Post left popliteal and posterior tibial angioplasty for CLI                    with tissue loss. Comparison Study: No prior study on file Performing Technologist: Sharion Dove RVS  Examination Guidelines: A complete evaluation includes B-mode imaging, spectral Doppler, color Doppler, and power Doppler as needed of all accessible portions of each vessel. Bilateral testing is considered an integral part of a complete examination. Limited examinations for reoccurring indications may be performed as noted. The reflux portion of the exam is performed with the patient in reverse Trendelenburg.   +-----+---------------+---------+-----------+----------+--------------+ RIGHTCompressibilityPhasicitySpontaneityPropertiesThrombus Aging +-----+---------------+---------+-----------+----------+--------------+ CFV  Full           Yes      Yes                                 +-----+---------------+---------+-----------+----------+--------------+   +---------+---------------+---------+-----------+----------+--------------+ LEFT     CompressibilityPhasicitySpontaneityPropertiesThrombus Aging +---------+---------------+---------+-----------+----------+--------------+ CFV      Full           Yes      Yes                                 +---------+---------------+---------+-----------+----------+--------------+ SFJ      Full                                                        +---------+---------------+---------+-----------+----------+--------------+  FV Prox  Full                                                        +---------+---------------+---------+-----------+----------+--------------+ FV Mid   Full                                                        +---------+---------------+---------+-----------+----------+--------------+ FV DistalFull                                                        +---------+---------------+---------+-----------+----------+--------------+ PFV      Full                                                        +---------+---------------+---------+-----------+----------+--------------+ POP      Full           Yes      Yes                                 +---------+---------------+---------+-----------+----------+--------------+ PTV      Full                                                        +---------+---------------+---------+-----------+----------+--------------+ PERO     Full                                                         +---------+---------------+---------+-----------+----------+--------------+     Summary: RIGHT: - No evidence of common femoral vein obstruction.  LEFT: - There is no evidence of deep vein thrombosis in the lower extremity.  *See table(s) above for measurements and observations. Electronically signed by Ruta Hinds MD on 05/01/2021 at 11:47:45 AM.    Final     Labs:  CBC: Recent Labs    05/22/21 0117 05/23/21 0509 05/24/21 0222 05/25/21 0804  WBC 13.6* 15.5* 14.0* 12.1*  HGB 10.2* 10.4* 9.9* 9.4*  HCT 30.8* 31.6* 30.3* 27.8*  PLT 262 312 313 314    COAGS: Recent Labs    04/27/21 1229 05/18/21 2306  INR 1.0 1.1  APTT  --  34    BMP: Recent Labs    05/22/21 0117 05/23/21 0509 05/24/21 0222 05/25/21 0800  NA 133* 132* 135 134*  K 3.7 4.8 4.3 5.0  CL 97* 94* 98 97*  CO2 21* 23 24 24   GLUCOSE 109* 248* 147* 191*  BUN 44* 38* 61* 78*  CALCIUM 9.0  9.0 9.0 8.9  CREATININE 9.47* 6.03* 7.69* 9.25*  GFRNONAA 5* 9* 7* 6*    LIVER FUNCTION TESTS: Recent Labs    04/27/21 1229 04/28/21 0202 05/05/21 0830 05/18/21 2306 05/19/21 1741 05/22/21 0117 05/23/21 0509 05/24/21 0222 05/25/21 0800  BILITOT 0.4 0.3  --  1.1  --   --   --   --   --   AST 10* 7*  --  42*  --   --   --   --   --   ALT 9 9  --  15  --   --   --   --   --   ALKPHOS 88 74  --  86  --   --   --   --   --   PROT 8.5* 7.3  --  8.7*  --   --   --   --   --   ALBUMIN 3.6 3.0*   < > 3.5   < > 2.6* 2.8* 2.5* 2.4*   < > = values in this interval not displayed.    TUMOR MARKERS: No results for input(s): AFPTM, CEA, CA199, CHROMGRNA in the last 8760 hours.  Assessment and Plan: 70 y.o. male with ESRD on HD via right forearm graft that was placed on 11/27/2018. Patient underwent dialysis today with no problem; however, the right arm appeared to be swollen after the dialysis.   IR was requested for right AVG shuntogram and possible intervention and possible TDC placement.  Case was reviewed and approved  by Dr. Anselm Pancoast.    Procedure is tentatively scheduled for tomorrow pending IR schedule.   Made NPO since midnight  Ancef 2g for possible TDC placement ordered VSS WBC 12.1, hgb 9.4, plt 314  Blood cx drown 8/4 No growth 5 days   Patient is currently confused, not able to make his own medical decision.  Sister Kash Davie was contacted via telephone and the procedure was discussed.   Risks and benefits discussed with the patient including, but not limited to bleeding, infection, vascular injury, need for tunneled HD catheter placement.  All of the sister's questions were answered, patient is agreeable to proceed. Consent signed and in IR binder.    Thank you for this interesting consult.  I greatly enjoyed meeting SHOWN DISSINGER and look forward to participating in their care.  A copy of this report was sent to the requesting provider on this date.  Electronically Signed: Tera Mater, PA-C 05/25/2021, 1:59 PM   I spent a total of 20 Minutes    in face to face in clinical consultation, greater than 50% of which was counseling/coordinating care for shuntogram with possible interventions and possible TDC placement

## 2021-05-25 NOTE — Therapy (Signed)
OT Cancellation Note  Patient Details Name: Roger Vazquez MRN: [id number] DOB: Nov 04, 1950   Cancelled Treatment:    Reason Eval/Treat Not Completed: Patient at procedure or test/ unavailable (Pt is currently off floor at HD. Will f/u for acute OT next avail date or as schedule allows.)  Almyra Deforest, OTR/L 05/25/2021, 10:48 AM

## 2021-05-25 NOTE — Progress Notes (Signed)
[facility] Progress Note   Subjective: Seen on HD, yelling for help. More confused today, not able to easily direct as usual. Otherwise tolerating HD without issue.     Objective Vitals:   05/25/21 0800 05/25/21 0830 05/25/21 0900 05/25/21 0930  BP: (!) 147/73 121/68 138/73 (!) 127/105  Pulse: 98 94  (!) 102  Resp: 17 15  19   Temp:      TempSrc:      SpO2: 99% 99% 100% 100%  Weight:      Height:       Physical Exam General: Chronically ill elderly male in NAD Neuro: Awake, agitated today. Oriented to self only.  BHALP:F7,T0  2/6 systolic M. SR HR 90s.  Lungs: CTAB No WOB Abdomen: NABS Extremities: Drsg intact L foot. No edema.  Dialysis Access: AVG +T/B. Swelling edema noted RUE access arm. Not infiltrated.    Additional Objective Labs: Basic Metabolic Panel: Recent Labs  Lab 05/23/21 0509 05/24/21 0222 05/25/21 0800  NA 132* 135 134*  K 4.8 4.3 5.0  CL 94* 98 97*  CO2 23 24 24   GLUCOSE 248* 147* 191*  BUN 38* 61* 78*  CREATININE 6.03* 7.69* 9.25*  CALCIUM 9.0 9.0 8.9  PHOS 4.9* 3.8 3.7   Liver Function Tests: Recent Labs  Lab 05/18/21 2306 05/19/21 1741 05/23/21 0509 05/24/21 0222 05/25/21 0800  AST 42*  --   --   --   --   ALT 15  --   --   --   --   ALKPHOS 86  --   --   --   --   BILITOT 1.1  --   --   --   --   PROT 8.7*  --   --   --   --   ALBUMIN 3.5   < > 2.8* 2.5* 2.4*   < > = values in this interval not displayed.   No results for input(s): LIPASE, AMYLASE in the last 168 hours. CBC: Recent Labs  Lab 05/18/21 2306 05/19/21 0739 05/21/21 0135 05/22/21 0117 05/23/21 0509 05/24/21 0222 05/25/21 0804  WBC 18.9*   < > 16.0* 13.6* 15.5* 14.0* 12.1*  NEUTROABS 16.2*  --   --   --   --   --   --   HGB 11.0*   < > 9.8* 10.2* 10.4* 9.9* 9.4*  HCT 32.3*   < > 29.3* 30.8* 31.6* 30.3* 27.8*  MCV 103.5*   < > 102.1* 102.7* 102.9* 104.8* 101.8*  PLT 293   < > 271 262 312 313 314   < > = values in this interval not displayed.    Blood Culture    Component Value Date/Time   SDES BLOOD LEFT FOREARM 05/18/2021 1955   SDES BLOOD LEFT HAND 05/18/2021 1955   SPECREQUEST  05/18/2021 1955    BOTTLES DRAWN AEROBIC AND ANAEROBIC Blood Culture adequate volume   SPECREQUEST  05/18/2021 1955    BOTTLES DRAWN AEROBIC AND ANAEROBIC Blood Culture adequate volume   CULT  05/18/2021 1955    NO GROWTH 5 DAYS Performed at [facility], [place] [address], [place], [place] [address]    CULT  05/18/2021 1955    NO GROWTH 5 DAYS Performed at [facility] [address] [place], [place] [address]    REPTSTATUS 05/24/2021 FINAL 05/18/2021 1955   REPTSTATUS 05/24/2021 FINAL 05/18/2021 1955    Cardiac Enzymes: Recent Labs  Lab 05/19/21 0557  CKTOTAL 430*  CBG: Recent Labs  Lab 05/24/21 0749 05/24/21 1212 05/24/21 1550 05/24/21 2101 05/25/21 0732  GLUCAP 134* 105* 195* 144* 163*   Iron Studies: No results for input(s): IRON, TIBC, TRANSFERRIN, FERRITIN in the last 72 hours. @lablastinr3 @ Studies/Results: No results found. Medications:  sodium chloride Stopped (05/22/21 2314)   sodium chloride     sodium chloride      (feeding supplement) PROSource Plus  30 mL Oral BID BM   acetaminophen  500 mg Oral Q6H   aspirin  81 mg Oral Daily   calcium acetate  667 mg Oral TID WC   clopidogrel  75 mg Oral Daily   [START ON 05/26/2021] darbepoetin (ARANESP) injection - DIALYSIS  25 mcg Intravenous Q Fri-HD   doxercalciferol  1 mcg Intravenous Q M,W,F-HD   feeding supplement (GLUCERNA SHAKE)  237 mL Oral TID BM   heparin  5,000 Units Subcutaneous Q8H   hydrALAZINE  50 mg Oral Q8H   insulin aspart  0-9 Units Subcutaneous TID WC   multivitamin  1 tablet Oral QHS   nutrition supplement (JUVEN)  1 packet Oral BID BM   polyethylene glycol  17 g Oral BID   senna  2 tablet Oral Daily     Dialysis Orders: [facility] MWF 3hr, 400/Autoflow 1.5, 60.kg, 2K/3Ca, AVG,  --Heparin 2,000 units IV TIW - Hectoral 29mcg IV q  HD - Mircera 63mcg IV q 2 weeks - last 7/13 -Venofer 100 q. dialysis last dose to be given 8/10.     Assessment/Plan: 1. 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 Per primary/VVS.  2. AMS: Multifactorial. He has baseline dementia, H/O polysubstance abuse, sepsis from foot wound. Per primary  3. ESRD -MWF. Next HD 05/25/2021 due to staffing issues in HD unit. No heparin.  4. Anemia - HGB 9.9. Aranesp 25 mcg IV 05/20/21. Can resume Fe Load.  5. Secondary hyperparathyroidism -Ca 10 decrease hectorol to 1 mcg IV TIW. PO4 at goal. 6. HTN/volume - No evidence of volume excess. Minimal UF with HD 05/25/2021. BP soft. Decreased UFG, added UFP 2. UF as tolerated.  7. Nutrition - Renal Diet. Low albumin. Add protein supps. 8. DMT2-No diabetic meds on OP med list. Per primary. 9. Swelling RUE: Unilateral on access arm. Will order shuntogram in IR.   Disposition: Awaiting SNF placement.      Brit[place] Anagnos H. Ayrton Mcvay NP-C 05/25/2021, 9:59 AM  [employer] [phone]

## 2021-05-25 NOTE — Progress Notes (Addendum)
Family Medicine Teaching Service Daily Progress Note Intern Pager: [phone]  Patient name: Roger Vazquez Medical record number: [id number] Date of birth: 1951/03/04 Age: 70 y.o. Gender: male  Primary Care Provider: [facility] Consultants: VVS Code Status: Full  Pt Overview and Major Events to Date:  8/5- admitted 8/6- recurrent hypoglycemia 8/8- LLE transmetatarsal amputation  Assessment and Plan: Mr. Linch is a 71-yr-old male with a history of ESRD on HD MWF, hx of lung CA, PAD, HTN, DM2, polysubstance abuse who presented with worsening gangrene of his L foot after a recent admission and second toe amputation. He is now post op day 3 form a transmetatarsal amputation.  Gangrene of L foot, s/p transmetatarsal amputation Post-op day 3. Remains afebrile. Pain is well-controlled. Only required one dose PRN oxycodone yesterday. D/c'd abx yesterday.  - Awaiting SNF  - Tylenol 500mg  q6h - Oxycodone 2.5mg  q4 PRN  - PT/OT  AMS, resolved  Hx of polysubstance abuse Has not been agitated in 48 hours.  - Continue delirium precautions - Dysphagia 2 diet per SLP  ESRD on HD Did not receive hospital HD yesterday. - Confirm with nephro that he will be dialyzed today.  - RFP - Continue renal vitamin and PhosLo  Urinary retention, resolved Has been voiding spontaneously. - Can d/c q8 bladder scans  Constipation No BM since 08/08.  - Increase MiraLAX to BID - Senna two tabs daily   HTN BP 130-150s/60-70s. - Continue hydralazine 50mg  TID  DM2 Glucoses 105-195. - Continue current regimen.  FEN/GI: Dysphagia 2 PPx: Heparin Dispo:SNF today. Barriers include placement .   Subjective:  Mr. Venhuizen reports overall feeling well today. He has minor residual post-surgical pain. Per RN he slept well overnight without further episodes of agitation/disorientation.  Objective: Temp:  [98.4 F (36.9 C)-99 F (37.2 C)] 99 F (37.2 C) (08/11 0750) Pulse Rate:  [74-98]  94 (08/11 0830) Resp:  [13-17] 15 (08/11 0830) BP: (114-156)/(55-73) 138/73 (08/11 0900) SpO2:  [96 %-100 %] 99 % (08/11 0830) Weight:  [58.9 kg-60 kg] 60 kg (08/11 0750) Physical Exam: General: Comfortable-appearing, NAD, in transit to HD Cardiovascular: Irregular, transmitted fistula sounds Respiratory: Normal work of breathing on room air, lung fields clear to auscultation anteriorly Abdomen: Bowel sounds normal, soft, nontender Extremities: Left lower extremity tender, nonerythematous, no edema  Laboratory: Recent Labs  Lab 05/23/21 0509 05/24/21 0222 05/25/21 0804  WBC 15.5* 14.0* 12.1*  HGB 10.4* 9.9* 9.4*  HCT 31.6* 30.3* 27.8*  PLT 312 313 314   Recent Labs  Lab 05/18/21 2306 05/19/21 1741 05/22/21 0117 05/23/21 0509 05/24/21 0222  NA 135   < > 133* 132* 135  K 4.1   < > 3.7 4.8 4.3  CL 96*   < > 97* 94* 98  CO2 22   < > 21* 23 24  BUN 56*   < > 44* 38* 61*  CREATININE 10.69*   < > 9.47* 6.03* 7.69*  CALCIUM 9.2   < > 9.0 9.0 9.0  PROT 8.7*  --   --   --   --   BILITOT 1.1  --   --   --   --   ALKPHOS 86  --   --   --   --   ALT 15  --   --   --   --   AST 42*  --   --   --   --   GLUCOSE 161*   < > 109* 248* 147*   < > =  values in this interval not displayed.    Imaging/Diagnostic Tests: No new imaging, labs  Eppie Gibson, MD 05/25/2021, 9:18 AM PGY-1, [place] Intern pager: [phone], text pages welcome

## 2021-05-25 NOTE — Progress Notes (Signed)
FPTS Interim Night Progress Note  S:Patient sleeping comfortably.  Rounded with primary night RN.  No concerns voiced.  No orders required.    O: Today's Vitals   05/24/21 1949 05/24/21 2127 05/25/21 0006 05/25/21 0358  BP: 131/71 (!) 141/68 (!) 141/70 (!) 156/73  Pulse: 82  92 89  Resp: 13  15 16   Temp: 98.8 F (37.1 C)  98.8 F (37.1 C) 98.6 F (37 C)  TempSrc: Oral  Oral Oral  SpO2: 99%  100% 98%  Weight:    58.9 kg  Height:      PainSc:          A/P: Continue current management  Carollee Leitz MD PGY-3, [place] Medicine Service pager [phone]

## 2021-05-25 NOTE — TOC Progression Note (Addendum)
Transition of Care [facility]) - Progression Note    Patient Details  Name: Roger Vazquez MRN: [id number] Date of Birth: [birth year]  Transition of Care [facility]) CM/SW [place], [place] Phone Number: 05/25/2021, 3:01 PM  Clinical Narrative:     Patient has no current SNF bed offers. Patients sister Roger Vazquez brought in [place] card to front desk. Front desk sent it to registration to scan in. CSW spoke with Marya Amsler Renal navigator who confirmed that patients HD center is [facility] in [place]. Patients seat time is 5:30 am on Monday, Wednesday, and Friday.CSW will continue to follow and assist with dc planning needs.  Expected Discharge Plan: [place] Barriers to Discharge: Continued Medical Work up  Expected Discharge Plan and Services Expected Discharge Plan: [place] In-house Referral: Clinical Social Work     Living arrangements for the past 2 months: Single Family Home                                       Social Determinants of Health (SDOH) Interventions    Readmission Risk Interventions No flowsheet data found.

## 2021-05-25 NOTE — Progress Notes (Signed)
Gauze dressing, hemostasis achieved, site clean, dry and intact

## 2021-05-25 NOTE — Progress Notes (Signed)
Inpatient Rehabilitation Admissions Coordinator   SNF is recommended. We will sign off at this time.  Danne Baxter, RN, MSN Rehab Admissions Coordinator [phone] 05/25/2021 1:17 PM

## 2021-05-26 ENCOUNTER — Inpatient Hospital Stay (HOSPITAL_COMMUNITY): Payer: No Typology Code available for payment source

## 2021-05-26 DIAGNOSIS — Z89439 Acquired absence of unspecified foot: Secondary | ICD-10-CM | POA: Diagnosis not present

## 2021-05-26 DIAGNOSIS — N186 End stage renal disease: Secondary | ICD-10-CM

## 2021-05-26 HISTORY — PX: IR US GUIDE VASC ACCESS RIGHT: IMG2390

## 2021-05-26 HISTORY — PX: IR DIALY SHUNT INTRO NEEDLE/INTRACATH INITIAL W/IMG RIGHT: IMG6115

## 2021-05-26 LAB — RENAL FUNCTION PANEL
Albumin: 2.5 g/dL — ABNORMAL LOW (ref 3.5–5.0)
Anion gap: 11 (ref 5–15)
BUN: 32 mg/dL — ABNORMAL HIGH (ref 8–23)
CO2: 27 mmol/L (ref 22–32)
Calcium: 8.8 mg/dL — ABNORMAL LOW (ref 8.9–10.3)
Chloride: 93 mmol/L — ABNORMAL LOW (ref 98–111)
Creatinine, Ser: 5.11 mg/dL — ABNORMAL HIGH (ref 0.61–1.24)
GFR, Estimated: 11 mL/min — ABNORMAL LOW (ref >60)
Glucose, Bld: 156 mg/dL — ABNORMAL HIGH (ref 70–99)
Phosphorus: 2.4 mg/dL — ABNORMAL LOW (ref 2.5–4.6)
Potassium: 4.3 mmol/L (ref 3.5–5.1)
Sodium: 131 mmol/L — ABNORMAL LOW (ref 135–145)

## 2021-05-26 LAB — CBC
HCT: 27.1 % — ABNORMAL LOW (ref 39.0–52.0)
Hemoglobin: 8.9 g/dL — ABNORMAL LOW (ref 13.0–17.0)
MCH: 33.8 pg (ref 26.0–34.0)
MCHC: 32.8 g/dL (ref 30.0–36.0)
MCV: 103 fL — ABNORMAL HIGH (ref 80.0–100.0)
Platelets: 314 10*3/uL (ref 150–400)
RBC: 2.63 MIL/uL — ABNORMAL LOW (ref 4.22–5.81)
RDW: 14.9 % (ref 11.5–15.5)
WBC: 11 10*3/uL — ABNORMAL HIGH (ref 4.0–10.5)
nRBC: 0 % (ref 0.0–0.2)

## 2021-05-26 LAB — GLUCOSE, CAPILLARY
Glucose-Capillary: 200 mg/dL — ABNORMAL HIGH (ref 70–99)
Glucose-Capillary: 184 mg/dL — ABNORMAL HIGH (ref 70–99)
Glucose-Capillary: 141 mg/dL — ABNORMAL HIGH (ref 70–99)

## 2021-05-26 MED ORDER — POLYETHYLENE GLYCOL 3350 17 G PO PACK
17.0000 g | PACK | Freq: Every day | ORAL | Status: DC
  Administered 2021-05-28 – 2021-05-31 (×2): 17 g via ORAL
  Filled 2021-05-26 (×3): qty 1

## 2021-05-26 MED ORDER — HEPARIN SODIUM (PORCINE) 1000 UNIT/ML IJ SOLN
INTRAMUSCULAR | Status: AC
  Filled 2021-05-26: qty 1

## 2021-05-26 MED ORDER — IOHEXOL 300 MG/ML  SOLN
50.0000 mL | Freq: Once | INTRAMUSCULAR | Status: AC | PRN
  Administered 2021-05-26: 50 mL via INTRAVENOUS

## 2021-05-26 MED ORDER — PROSOURCE PLUS PO LIQD
30.0000 mL | Freq: Three times a day (TID) | ORAL | Status: DC
  Administered 2021-05-27 – 2021-05-31 (×8): 30 mL via ORAL
  Filled 2021-05-26 (×10): qty 30

## 2021-05-26 MED ORDER — HEPARIN SODIUM (PORCINE) 1000 UNIT/ML IJ SOLN
INTRAMUSCULAR | Status: AC | PRN
  Administered 2021-05-26 (×2): 2000 [IU] via INTRAVENOUS

## 2021-05-26 MED ORDER — DARBEPOETIN ALFA 60 MCG/0.3ML IJ SOSY
60.0000 ug | PREFILLED_SYRINGE | INTRAMUSCULAR | Status: DC
  Filled 2021-05-26: qty 0.3

## 2021-05-26 MED ORDER — MIDAZOLAM HCL 2 MG/2ML IJ SOLN
INTRAMUSCULAR | Status: AC
  Filled 2021-05-26: qty 2

## 2021-05-26 MED ORDER — MIDAZOLAM HCL 2 MG/2ML IJ SOLN
INTRAMUSCULAR | Status: AC | PRN
  Administered 2021-05-26 (×3): 0.5 mg via INTRAVENOUS

## 2021-05-26 MED ORDER — FENTANYL CITRATE (PF) 100 MCG/2ML IJ SOLN
INTRAMUSCULAR | Status: AC
  Filled 2021-05-26: qty 2

## 2021-05-26 MED ORDER — SENNA 8.6 MG PO TABS
1.0000 | ORAL_TABLET | Freq: Every day | ORAL | Status: DC
  Administered 2021-05-28 – 2021-05-31 (×4): 8.6 mg via ORAL
  Filled 2021-05-26 (×5): qty 1

## 2021-05-26 MED ORDER — NEPRO/CARBSTEADY PO LIQD
237.0000 mL | Freq: Two times a day (BID) | ORAL | Status: DC
  Administered 2021-05-27 – 2021-05-31 (×8): 237 mL via ORAL

## 2021-05-26 MED ORDER — FENTANYL CITRATE (PF) 100 MCG/2ML IJ SOLN
INTRAMUSCULAR | Status: AC | PRN
  Administered 2021-05-26 (×3): 25 ug via INTRAVENOUS

## 2021-05-26 NOTE — Procedures (Addendum)
Interventional Radiology Procedure Note  Date of Procedure: 05/26/2021  Procedure: Fistulagram    Findings:  1. 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 Successful recanalization and PTA to 14 mm  2. Successful PTA of a second stenosis in the mid outflow to 7 mm.    Complications: No immediate complications noted.   Estimated Blood Loss: minimal  Follow-up and Recommendations: 1. Routine    Albin Felling, MD  Vascular & Interventional Radiology  05/26/2021 1:19 PM

## 2021-05-26 NOTE — Progress Notes (Signed)
Family medicine residents in to see and assess patient right arm and overall health status. RN also discussed that the patient had four BM 's in the 24 hour period. Family medicine will address issue.

## 2021-05-26 NOTE — Progress Notes (Signed)
Physical Therapy Treatment Patient Details Name: Roger Vazquez MRN: [id number] DOB: Jun 21, 1951 Today's Date: 05/26/2021    History of Present Illness Pt is a 70 y.o. male admitted 05/18/21 with AMS, worsening L foot gangrene. S/p L TMA on 8/8. Course complicated by delirium, CIWA. 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  PMH includes ESRD (HD MWF), lung CA, PAD (s/p L toe amputation), HTN, DM.    PT Comments    Pt making slow, steady progress with mobility. Continue to recommend SNF at DC.    Follow Up Recommendations  SNF     Equipment Recommendations  Rolling walker with 5" wheels;3in1 (PT);Wheelchair (measurements PT);Wheelchair cushion (measurements PT)    Recommendations for Other Services       Precautions / Restrictions Precautions Precautions: Fall Restrictions Other Position/Activity Restrictions: LLE WB through heel in darco shoe    Mobility  Bed Mobility Overal bed mobility: Needs Assistance Bed Mobility: Supine to Sit     Supine to sit: Max assist     General bed mobility comments: Assist to bring legs off of bed, elevate trunk into sitting and bring hips to EOB    Transfers Overall transfer level: Needs assistance Equipment used: Ambulation equipment used Transfers: Sit to/from Stand Sit to Stand: Mod assist;+2 physical assistance         General transfer comment: Assist to bring hips up and for balance. Used stedy to stand and to then go bed to recliner.  Ambulation/Gait             General Gait Details: unable   Stairs             Wheelchair Mobility    Modified Rankin (Stroke Patients Only)       Balance Overall balance assessment: Needs assistance Sitting-balance support: No upper extremity supported;Feet supported Sitting balance-Leahy Scale: Fair     Standing balance support: Bilateral  upper extremity supported Standing balance-Leahy Scale: Poor Standing balance comment: +2 mod assist for static standing with cues to extend hips. Pt with posterior lean                            Cognition Arousal/Alertness: Awake/alert Behavior During Therapy: Flat affect Overall Cognitive Status: No family/caregiver present to determine baseline cognitive functioning Area of Impairment: Orientation;Attention;Memory;Following commands;Safety/judgement;Awareness;Problem solving                 Orientation Level: Disoriented to;Time;Situation Current Attention Level: Sustained Memory: Decreased recall of precautions Following Commands: Follows one step commands inconsistently;Follows one step commands with increased time Safety/Judgement: Decreased awareness of safety;Decreased awareness of deficits Awareness: Intellectual Problem Solving: Slow processing;Decreased initiation;Difficulty sequencing;Requires verbal cues        Exercises      General Comments General comments (skin integrity, edema, etc.): VSS on RA      Pertinent Vitals/Pain Pain Assessment: No/denies pain    Home Living                      Prior Function            PT Goals (current goals can now be found in the care plan section) Progress towards PT goals: Progressing toward goals    Frequency    Min 2X/week      PT Plan Current plan remains appropriate;Frequency needs to  be updated    Co-evaluation              AM-PAC PT "6 Clicks" Mobility   Outcome Measure  Help needed turning from your back to your side while in a flat bed without using bedrails?: A Lot Help needed moving from lying on your back to sitting on the side of a flat bed without using bedrails?: A Lot Help needed moving to and from a bed to a chair (including a wheelchair)?: Total Help needed standing up from a chair using your arms (e.g., wheelchair or bedside chair)?: Total Help needed to  walk in hospital room?: Total Help needed climbing 3-5 steps with a railing? : Total 6 Click Score: 8    End of Session Equipment Utilized During Treatment: Gait belt Activity Tolerance: Patient limited by fatigue Patient left: in chair;with call bell/phone within reach;with chair alarm set Nurse Communication: Mobility status;Need for lift equipment PT Visit Diagnosis: Other abnormalities of gait and mobility (R26.89);Difficulty in walking, not elsewhere classified (R26.2)     Time: 4827-0786 PT Time Calculation (min) (ACUTE ONLY): 28 min  Charges:                        [place] Pager [phone] Office [place] 05/26/2021, 5:17 PM

## 2021-05-26 NOTE — Progress Notes (Addendum)
[facility] Progress Note   Subjective:    Seen and examined patient at bedside. No complaints/concerns. Denies SOB and CP. Noted hypotensive episode last HD 8/11-net UF only 500cc. Plan for HD on 05/29/21.  Objective Vitals:   05/26/21 1325 05/26/21 1335 05/26/21 1350 05/26/21 1429  BP: 140/69 (!) 151/72 (!) 146/76 (!) 144/82  Pulse: 92 98 99 98  Resp: 14 14 12 19   Temp:    98.9 F (37.2 C)  TempSrc:    Oral  SpO2: 100% 100% 100% 100%  Weight:      Height:       Physical Exam General: Chronically ill-appearing male; NAD Heart: S1 and S2; 2/6 murmur Lungs: Clear throughout; No wheezing, rales, or rhonchi Abdomen: Soft, non-tender, (+) BS Extremities:No edme BLLE Dialysis Access: R AVG (+) Bruit/Thrill. Noted edema RUE-R arm remains elevated   Filed Weights   05/25/21 0358 05/25/21 0750 05/26/21 0336  Weight: 58.9 kg 60 kg 60.3 kg    Intake/Output Summary (Last 24 hours) at 05/26/2021 1546 Last data filed at 05/26/2021 0500 Gross per 24 hour  Intake 180 ml  Output 0 ml  Net 180 ml    Additional Objective Labs: Basic Metabolic Panel: Recent Labs  Lab 05/24/21 0222 05/25/21 0800 05/26/21 0016  NA 135 134* 131*  K 4.3 5.0 4.3  CL 98 97* 93*  CO2 24 24 27   GLUCOSE 147* 191* 156*  BUN 61* 78* 32*  CREATININE 7.69* 9.25* 5.11*  CALCIUM 9.0 8.9 8.8*  PHOS 3.8 3.7 2.4*   Liver Function Tests: Recent Labs  Lab 05/24/21 0222 05/25/21 0800 05/26/21 0016  ALBUMIN 2.5* 2.4* 2.5*   No results for input(s): LIPASE, AMYLASE in the last 168 hours. CBC: Recent Labs  Lab 05/22/21 0117 05/23/21 0509 05/24/21 0222 05/25/21 0804 05/26/21 0016  WBC 13.6* 15.5* 14.0* 12.1* 11.0*  HGB 10.2* 10.4* 9.9* 9.4* 8.9*  HCT 30.8* 31.6* 30.3* 27.8* 27.1*  MCV 102.7* 102.9* 104.8* 101.8* 103.0*  PLT 262 312 313 314 314   Blood Culture    Component Value Date/Time   SDES BLOOD LEFT FOREARM 05/18/2021 1955   SDES BLOOD LEFT HAND 05/18/2021 1955    SPECREQUEST  05/18/2021 1955    BOTTLES DRAWN AEROBIC AND ANAEROBIC Blood Culture adequate volume   SPECREQUEST  05/18/2021 1955    BOTTLES DRAWN AEROBIC AND ANAEROBIC Blood Culture adequate volume   CULT  05/18/2021 1955    NO GROWTH 5 DAYS Performed at [facility], [place] [address] [place], [place] [address]    CULT  05/18/2021 1955    NO GROWTH 5 DAYS Performed at [facility] [address] [place], [place] [address]    REPTSTATUS 05/24/2021 FINAL 05/18/2021 1955   REPTSTATUS 05/24/2021 FINAL 05/18/2021 1955    Cardiac Enzymes: No results for input(s): CKTOTAL, CKMB, CKMBINDEX, TROPONINI in the last 168 hours. CBG: Recent Labs  Lab 05/25/21 0732 05/25/21 1210 05/25/21 1631 05/25/21 2203 05/26/21 0723  GLUCAP 163* 166* 183* 161* 200*   Iron Studies: No results for input(s): IRON, TIBC, TRANSFERRIN, FERRITIN in the last 72 hours. Lab Results  Component Value Date   INR 1.1 05/18/2021   INR 1.0 04/27/2021   INR 1.08 07/31/2018   Studies/Results: No results found.  Medications:  sodium chloride Stopped (05/22/21 2314)    ceFAZolin (ANCEF) IV      [START ON 05/27/2021] (feeding supplement) PROSource Plus  30 mL Oral TID BM   acetaminophen  500 mg  Oral Q6H   aspirin  81 mg Oral Daily   calcium acetate  667 mg Oral TID WC   clopidogrel  75 mg Oral Daily   darbepoetin (ARANESP) injection - DIALYSIS  25 mcg Intravenous Q Fri-HD   doxercalciferol  1 mcg Intravenous Q M,W,F-HD   [START ON 05/27/2021] feeding supplement (NEPRO CARB STEADY)  237 mL Oral BID BM   heparin  5,000 Units Subcutaneous Q8H   hydrALAZINE  50 mg Oral Q8H   insulin aspart  0-9 Units Subcutaneous TID WC   multivitamin  1 tablet Oral QHS   [START ON 05/27/2021] polyethylene glycol  17 g Oral Daily   senna  1 tablet Oral Daily    Dialysis Orders: [facility] MWF 3hr, 400/Autoflow 1.5, 60.kg, 2K/3Ca, AVG,  --Heparin 2,000 units IV TIW - Hectoral 67mcg IV q HD - Mircera 3mcg IV q 2 weeks -  last 7/13 -Venofer 100 q. dialysis last dose to be given 8/10.    Assessment/Plan: 1. 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 Per primary/VVS.  2. AMS: Multifactorial. He has baseline dementia, H/O polysubstance abuse, sepsis from foot wound. Per primary  3. ESRD -MWF. Last HD on 8/11-had hypotensive episode during last HD-only tolerated net UF 500cc-Bps appear to be improved after HD. Next HD 05/29/2021. HD gap due to staffing issues in HD unit. No heparin. Will continue to monitor patient's volume status closely through the weekend. Will perform HD sooner if clinically indicated. 4. Anemia - HGB now 8.9. Aranesp 25 mcg IV 05/20/21-scheduled dose today-will raise dose. Can resume Fe Load.  5. Secondary hyperparathyroidism -Ca 10 decrease hectorol to 1 mcg IV TIW. PO4 at goal. 6. HTN/volume - No evidence of volume excess. BP soft during last HD. Decreased UFG, added UFP 2. UF as tolerated.  7. Nutrition - Renal Diet. Low albumin. Add protein supps. 8. DMT2-No diabetic meds on OP med list. Per primary. 9. Swelling RUE: Unilateral on access arm. Will order shuntogram in IR-completed today: showed complete occlusion brachiocephalic vein, successful re-cannulation and PTA 33mm. Also PTA 2nd stenosis in mid outflow to 68mm. Continue to keep RUE elevated.   Disposition: Awaiting SNF placement.   Tobie Poet, NP [facility] 05/26/2021,3:46 PM  LOS: 7 days

## 2021-05-26 NOTE — Progress Notes (Signed)
FPTS Interim Night Progress Note  S:Patient sleeping comfortably.  Rounded with primary night RN.  Concern for right arm swelling. Noted that patient was protecting arm and was warm to touch on upper arm.  Distal pulses present.   O: Today's Vitals   05/25/21 1500 05/25/21 1938 05/25/21 2224 05/26/21 0336  BP:   (!) 148/74 (!) 162/78  Pulse: 96  100 100  Resp: 17  20 16   Temp:  99.5 F (37.5 C) 99.7 F (37.6 C) 98.6 F (37 C)  TempSrc:  Oral Oral Oral  SpO2: 98%  100% 100%  Weight:    60.3 kg  Height:      PainSc:          A/P: Plan for shuntogram in am   Carollee Leitz MD PGY-3, [facility] pager [phone]

## 2021-05-26 NOTE — Progress Notes (Signed)
Notified on call family medicine resident regarding patient right arm and hand swollen, warm to touch, and patient has a low grade fever. Patient also seemed to be guarding the right arm although when asked, patient denies pain anywhere. Elevated right arm on pillow. Family medicine resident will be seeing the patient.

## 2021-05-26 NOTE — Progress Notes (Signed)
Occupational Therapy Treatment Patient Details Name: Roger Vazquez MRN: [id number] DOB: October 29, 1950 Today's Date: 05/26/2021    History of present illness Pt is a 70 y.o. male admitted 05/18/21 with AMS, worsening L foot gangrene. S/p L TMA on 8/8. Course complicated by delirium, CIWA. 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  PMH includes ESRD (HD MWF), lung CA, PAD (s/p L toe amputation), HTN, DM.   OT comments  Pt worked on grooming in the chair this session and sit<>stand transfers. He is making incremental progress with OT goals and at times he is more participatory/agreeable to work with therapy. Pt requiring mod A +2 to stand from recliner, unable to pivot towards bed. OT will continue to follow acutely.    Follow Up Recommendations  SNF;Supervision/Assistance - 24 hour;Other (comment)    Equipment Recommendations  Other (comment) (defer to next venue)    Recommendations for Other Services      Precautions / Restrictions Precautions Precautions: Fall Restrictions Other Position/Activity Restrictions: LLE WB through heel in darco shoe       Mobility Bed Mobility Overal bed mobility: Needs Assistance Bed Mobility: Supine to Sit     Supine to sit: Max assist     General bed mobility comments: Up in recliner    Transfers Overall transfer level: Needs assistance Equipment used: Rolling walker (2 wheeled) Transfers: Sit to/from Stand Sit to Stand: Mod assist;+2 physical assistance         General transfer comment: assist to power up and clear  bottom from chair x 3    Balance Overall balance assessment: Needs assistance Sitting-balance support: No upper extremity supported;Feet supported Sitting balance-Leahy Scale: Fair     Standing balance support: Bilateral upper extremity supported Standing balance-Leahy Scale: Poor Standing  balance comment: +2 mod assist for static standing with cues to extend hips. Pt with posterior lean                           ADL either performed or assessed with clinical judgement   ADL Overall ADL's : Needs assistance/impaired     Grooming: Wash/dry hands;Wash/dry face;Set up;Sitting Grooming Details (indicate cue type and reason): Pt used washcloth sitting in recliner to complete task                 Toilet Transfer: Moderate assistance;+2 for safety/equipment;+2 for physical assistance;Stand-pivot Toilet Transfer Details (indicate cue type and reason): Simulated with sit<>stnads         Functional mobility during ADLs: Moderate assistance;+2 for physical assistance;+2 for safety/equipment;Cueing for safety;Cueing for sequencing General ADL Comments: Pt completing seated tasks with set up, continueing torequire mod A for all standing/transfer tasks.     Vision       Perception     Praxis      Cognition Arousal/Alertness: Awake/alert Behavior During Therapy: Flat affect Overall Cognitive Status: No family/caregiver present to determine baseline cognitive functioning Area of Impairment: Orientation;Attention;Memory;Following commands;Safety/judgement;Awareness;Problem solving                 Orientation Level: Disoriented to;Time;Situation Current Attention Level: Sustained Memory: Decreased recall of precautions Following Commands: Follows one step commands inconsistently;Follows one step commands with increased time Safety/Judgement: Decreased awareness of safety;Decreased awareness of deficits Awareness: Intellectual Problem Solving: Slow processing;Decreased initiation;Difficulty sequencing;Requires verbal cues  Exercises     Shoulder Instructions       General Comments VSS on RA    Pertinent Vitals/ Pain       Pain Assessment: No/denies pain  Home Living                                           Prior Functioning/Environment              Frequency  Min 2X/week        Progress Toward Goals  OT Goals(current goals can now be found in the care plan section)  Progress towards OT goals: Progressing toward goals  Acute Rehab OT Goals Patient Stated Goal: To get out of hospital OT Goal Formulation: Patient unable to participate in goal setting Time For Goal Achievement: 06/13/21 Potential to Achieve Goals: Fair ADL Goals Pt Will Perform Grooming: with modified independence;sitting Pt Will Perform Lower Body Bathing: with min assist;sit to/from stand;sitting/lateral leans Pt Will Transfer to Toilet: with modified independence;ambulating;bedside commode Pt Will Perform Toileting - Clothing Manipulation and hygiene: with modified independence;sitting/lateral leans;sit to/from stand Pt Will Perform Tub/Shower Transfer: Tub transfer;Shower transfer;with min assist;ambulating;shower seat;tub bench;grab bars;rolling walker  Plan Discharge plan remains appropriate;Frequency remains appropriate    Co-evaluation                 AM-PAC OT "6 Clicks" Daily Activity     Outcome Measure   Help from another person eating meals?: A Little Help from another person taking care of personal grooming?: A Little Help from another person toileting, which includes using toliet, bedpan, or urinal?: A Lot Help from another person bathing (including washing, rinsing, drying)?: A Lot Help from another person to put on and taking off regular upper body clothing?: A Little Help from another person to put on and taking off regular lower body clothing?: A Lot 6 Click Score: 15    End of Session Equipment Utilized During Treatment: Gait belt;Rolling walker  OT Visit Diagnosis: Unsteadiness on feet (R26.81);Other abnormalities of gait and mobility (R26.89);Muscle weakness (generalized) (M62.81);Other symptoms and signs involving cognitive function   Activity Tolerance Patient limited by  fatigue   Patient Left in chair;with call bell/phone within reach;with chair alarm set   Nurse Communication Mobility status;Patient requests pain meds        Time: 2426-8341 OT Time Calculation (min): 15 min  Charges: OT General Charges $OT Visit: 1 Visit OT Treatments $Self Care/Home Management : 8-22 mins  Shery Wauneka H., OTR/L Acute Rehabilitation  Roger Vazquez Roger Vazquez 05/26/2021, 5:41 PM

## 2021-05-26 NOTE — Progress Notes (Signed)
Nutrition Follow-up  DOCUMENTATION CODES:   Not applicable  INTERVENTION:   -Once diet is advanced:  -D/c Glcuerna -D/c Juven -Nepro Shake po BID, each supplement provides 425 kcal and 19 grams protein  -30 ml Prosource Plus TID, each supplement provides 100 kcals and 15 grams protein -Magic cup BID with meals, each supplement provides 290 kcal and 9 grams of protein  -Renal MVI daily  NUTRITION DIAGNOSIS:   Increased nutrient needs related to chronic illness (ESRD on HD) as evidenced by estimated needs.  Ongoing  GOAL:   Patient will meet greater than or equal to 90% of their needs  Progressing   MONITOR:   Diet advancement, PO intake, Supplement acceptance, Labs, Weight trends, Skin, I & O's  REASON FOR ASSESSMENT:   Consult Assessment of nutrition requirement/status, Poor PO  ASSESSMENT:   70 yo male with a PMH of ESRD on HD MWF, lung cancer, PAD, HTN, T2DM, and polysubstance abuse who presents with worsening gangrene of L foot and AMS.  8/8 - L transmetatarsal amputation 8/10- s/p BSE- advanced to dysphagia 2 diet with thin liquids  Reviewed I/O's: -188 ml x 24 hours and +1.5 L since admission  UOP: 0 ml x 24 hours  Attempted to evaluate pt x 4, however, out of room for procedure. Pt currently NPO for shuntogram today; pt with swelling in rt arm where AVF is located.   Pt with fair oral intake. Noted meal completions 50-80%.  Per therapy notes, plan to d/c to SNF once medically stable.    Medications reviewed and include aranesp and miralax.   Labs reviewed: Na: 131, Phos: 2.4, CBGS: 161-200 (inpatient orders for glycemic control are 0-9 units insulin aspart TID with meals).   Diet Order:   Diet Order             Diet NPO time specified Except for: Sips with Meds  Diet effective midnight                   EDUCATION NEEDS:   No education needs have been identified at this time  Skin:  Skin Assessment: Skin Integrity Issues: Skin  Integrity Issues:: Incisions Incisions: L metatarsal amputation, closed  Last BM:  05/25/21  Height:   Ht Readings from Last 1 Encounters:  05/22/21 5\' 6"  (1.676 m)    Weight:   Wt Readings from Last 1 Encounters:  05/26/21 60.3 kg   BMI:  Body mass index is 21.46 kg/m.  Estimated Nutritional Needs:   Kcal:  1800-2000  Protein:  90-105 grams  Fluid:  1000 ml + UOP    Loistine Chance, RD, LDN, [place] Registered Dietitian II Certified Diabetes Care and Education Specialist Please refer to [facility] for RD and/or RD on-call/weekend/after hours pager

## 2021-05-26 NOTE — Progress Notes (Signed)
PT Cancellation Note  Patient Details Name: Roger Vazquez MRN: [id number] DOB: [birth year]   Cancelled Treatment:    Reason Eval/Treat Not Completed: (P) Patient at procedure or test/unavailable (pt at IR dept.) Will continue efforts per PT POC as schedule permits.   [name] 05/26/2021, 1:21 PM

## 2021-05-26 NOTE — Progress Notes (Signed)
SLP Cancellation Note  Patient Details Name: MAYCEN DEGREGORY MRN: [id number] DOB: [birth year]   Cancelled treatment:       Reason Eval/Treat Not Completed: Medical issues which prohibited therapy. Pt NPO this morning for procedure. Will f/u as able.    Osie Bond., M.A. [facility] Acute Rehabilitation Services Pager [phone] Office [phone]  05/26/2021, 11:32 AM

## 2021-05-26 NOTE — Plan of Care (Signed)

## 2021-05-26 NOTE — Progress Notes (Addendum)
Family Medicine Teaching Service Daily Progress Note Intern Pager: [phone]  Patient name: Roger Vazquez Medical record number: [id number] Date of birth: 07/26/1951 Age: 70 y.o. Gender: male  Primary Care Provider: [facility] Consultants: VVS, IR, Nephro Code Status: Full  Pt Overview and Major Events to Date:  8/5- admitted 8/6- recurrent hypoglycemia 8/8- LLE transmetatarsal amputation 8/12- Scheduled for shuntogram with IR   Assessment and Plan: Roger Vazquez is a 70 year old male with a history of ESRD on HD MWF, hx of lung CA, PAD, HTN, DM2, polysubstance abuse who presented with worsening gangrene of his L foot after a recent admission and second toe amputation. He is now post op day 4 from a transmetatarsal amputation.  RUE Swelling  Patient was dialyzed yesterday without issue, however, immediately after dialysis his R arm (where his AVF is) became markedly warm and swollen compared to the L. He was evaluated by IR with plans for a shuntogram today. - Shuntogram with IR today - NPO prior to procedure - Ancef per IR  Gangrene of L foot, s/p transmetatarsal amputation Post-op day 4. Remains afebrile. Pain well-controlled. Received two doses PRN oxycodone yesterday.WBC continues downtrending (16.0>>12.1>11.0). - Tylenol 500mg  q6h - Oxycodone 2.5mg  q4 PRN - PT/OT  AMS  Hx of Polysubstance Abuse Mental status had improved since admission, however with new finding of R arm swelling, he seems increasingly confused. Likely that given his age, he has low mental reserve and a low threshold for experiencing delirium with pain/change of setting. - Continue delirium precautions - Dysphagia 2 diet per SLP  ESRD on HD Dialyzed yesterday and complained of pain in R arm as discussed above. - Dialysis per nephro - RFP on dialysis days - Continue renal vitamin and PhosLo  Constipation, resolved 4 BMs in past 24 hours. - Decrease MiraLAX and Senna to once daily    FEN/GI: NPO ahead of shuntogram PPx: Heparin Dispo:SNF in 2-3 days. Barriers include shuntogram today.   Subjective:  Roger Vazquez did not speak to me today but did answer questions with head nods and shakes.  Answered questions appropriately. Endorsed bowel movement yesterday.  He says that he does have tenderness to palpation in both his postop leg and right upper extremity but no pain at rest.  Objective: Temp:  [98.6 F (37 C)-99.7 F (37.6 C)] 98.6 F (37 C) (08/12 0336) Pulse Rate:  [90-106] 100 (08/12 0336) Resp:  [14-22] 16 (08/12 0336) BP: (85-162)/(49-105) 162/78 (08/12 0336) SpO2:  [98 %-100 %] 100 % (08/12 0336) Weight:  [60.3 kg] 60.3 kg (08/12 0336) Physical Exam: General: Frail older gentleman, lying in bed Cardiovascular: Irregular, transmitted fistula sounds Respiratory: Normal work of breathing on room air, lung fields clear to auscultation Abdomen: Soft, nontender, nondistended Extremities: Right upper extremity with diffuse edema and warmth, nonpitting, fingertips cold to touch but radial pulse 2+.  Able to make a fist  Laboratory: Recent Labs  Lab 05/24/21 0222 05/25/21 0804 05/26/21 0016  WBC 14.0* 12.1* 11.0*  HGB 9.9* 9.4* 8.9*  HCT 30.3* 27.8* 27.1*  PLT 313 314 314   Recent Labs  Lab 05/24/21 0222 05/25/21 0800 05/26/21 0016  NA 135 134* 131*  K 4.3 5.0 4.3  CL 98 97* 93*  CO2 24 24 27   BUN 61* 78* 32*  CREATININE 7.69* 9.25* 5.11*  CALCIUM 9.0 8.9 8.8*  GLUCOSE 147* 191* 156*    Imaging/Diagnostic Tests: Shuntogram today with IR  Eppie Gibson, MD 05/26/2021, 9:22 AM PGY-1, [place]  Medicine FPTS Intern pager: [phone], text pages welcome

## 2021-05-26 NOTE — TOC Progression Note (Addendum)
Transition of Care [facility]) - Progression Note    Patient Details  Name: Roger Vazquez MRN: [id number] Date of Birth: 03-Nov-1950  Transition of Care [facility]) CM/SW [place], [place] Phone Number: 05/26/2021, 2:52 PM  Clinical Narrative:      No current SNF bed offers for patient. Bryson Ha with [facility] asked CSW to follow back up with her on Monday to see if she can make SNF bed offer for patient. [facility] and [facility] is also reviewing to see if they can make a SNF bed offer. If CSW will continue to follow and assist with DC planning needs.  Expected Discharge Plan: [place] Barriers to Discharge: Continued Medical Work up  Expected Discharge Plan and Services Expected Discharge Plan: [place] In-house Referral: Clinical Social Work     Living arrangements for the past 2 months: Single Family Home                                       Social Determinants of Health (SDOH) Interventions    Readmission Risk Interventions No flowsheet data found.

## 2021-05-27 ENCOUNTER — Inpatient Hospital Stay (HOSPITAL_COMMUNITY): Payer: No Typology Code available for payment source

## 2021-05-27 DIAGNOSIS — N186 End stage renal disease: Secondary | ICD-10-CM | POA: Diagnosis not present

## 2021-05-27 DIAGNOSIS — Z89439 Acquired absence of unspecified foot: Secondary | ICD-10-CM | POA: Diagnosis not present

## 2021-05-27 LAB — RENAL FUNCTION PANEL
Albumin: 2.5 g/dL — ABNORMAL LOW (ref 3.5–5.0)
Anion gap: 14 (ref 5–15)
BUN: 47 mg/dL — ABNORMAL HIGH (ref 8–23)
CO2: 27 mmol/L (ref 22–32)
Calcium: 9.3 mg/dL (ref 8.9–10.3)
Chloride: 92 mmol/L — ABNORMAL LOW (ref 98–111)
Creatinine, Ser: 6.9 mg/dL — ABNORMAL HIGH (ref 0.61–1.24)
GFR, Estimated: 8 mL/min — ABNORMAL LOW (ref >60)
Glucose, Bld: 150 mg/dL — ABNORMAL HIGH (ref 70–99)
Phosphorus: 3.6 mg/dL (ref 2.5–4.6)
Potassium: 4.5 mmol/L (ref 3.5–5.1)
Sodium: 133 mmol/L — ABNORMAL LOW (ref 135–145)

## 2021-05-27 LAB — CBC
HCT: 27.9 % — ABNORMAL LOW (ref 39.0–52.0)
Hemoglobin: 9.2 g/dL — ABNORMAL LOW (ref 13.0–17.0)
MCH: 33.6 pg (ref 26.0–34.0)
MCHC: 33 g/dL (ref 30.0–36.0)
MCV: 101.8 fL — ABNORMAL HIGH (ref 80.0–100.0)
Platelets: 339 10*3/uL (ref 150–400)
RBC: 2.74 MIL/uL — ABNORMAL LOW (ref 4.22–5.81)
RDW: 15.1 % (ref 11.5–15.5)
WBC: 9.7 10*3/uL (ref 4.0–10.5)
nRBC: 0 % (ref 0.0–0.2)

## 2021-05-27 LAB — MAGNESIUM: Magnesium: 2.6 mg/dL — ABNORMAL HIGH (ref 1.7–2.4)

## 2021-05-27 LAB — GLUCOSE, CAPILLARY
Glucose-Capillary: 161 mg/dL — ABNORMAL HIGH (ref 70–99)
Glucose-Capillary: 161 mg/dL — ABNORMAL HIGH (ref 70–99)
Glucose-Capillary: 172 mg/dL — ABNORMAL HIGH (ref 70–99)
Glucose-Capillary: 155 mg/dL — ABNORMAL HIGH (ref 70–99)

## 2021-05-27 NOTE — Progress Notes (Addendum)
Family Medicine Teaching Service Daily Progress Note Intern Pager: [phone]  Patient name: Roger Vazquez Medical record number: [id number] Date of birth: 06-Jan-1951 Age: 70 y.o. Gender: male  Primary Care Provider: [facility], [facility] Consultants: IR, Nephro Code Status: Full  Pt Overview and Major Events to Date:  8/5- admitted 8/6- recurrent hypoglycemia 8/8- LLE transmetatarsal amputation 8/12- Shuntogram with IR  Assessment and Plan: Roger Vazquez is a 70 year old male with a history of ESRD on HD MWF, hx of lung CA, PAD, HTN, DM2, polysubstance abuse who presented with gangrene of his L foot after recent admission and second toe amputation. He is post-op day 5 from a transmetatarsal amputation.  Gangrene of L foot, s/p transmetatarsal amputation Post-op day 5. Remains afebrile. Pain well-controlled. Received one dose PRN oxycodone overnight. WBC continues to downtrend - Tylenol 500mg  scheduled q6h - Oxycodone 2.5mg  q4 PRN, d/c tomorrow - PT/OT  RUE Swelling Improving. Complete occlusion of braciocephalic vein seen on shuntogram with IR on 8/12, had successful re-cannulation. - Keep RUE elevated  AMS  Mental status improving, per myself and RN. Less confusion and he is able to answer questions appropriately. - Continue delirium precautions - Dysphagia 2 diet per SLP  ESRD on HD Dialyzed 2 days ago. Next session on 8/15. Will monitor volume status closely. - Nephrology following - Monitor renal function with RFP - I's/O's  Constipation, resolved. 2 BMs in past 24 hours, decreased from 4 the day before. - MiraLAX and Senna once daily  HTN Stable. Bps 130s-150s/60s-70s. - Continue current management  DM Stable. CBG 140s-180s. - Continue SSI - CBGs  FEN/GI: Dysphagia 2 diet PPx: Heparin Dispo:SNF in 2-3 days. Barriers include pain control.   Subjective:  Roger Vazquez was much more alert this morning and spoke with me. He says he is having "pain all over"  but cannot localize where the pain is. Per RN, he had 2 bowel movements overnight and just had a bath which might be why he is in pain. No concerns voiced.  Objective: Temp:  [97.9 F (36.6 C)-99.1 F (37.3 C)] 99.1 F (37.3 C) (08/13 0507) Pulse Rate:  [77-99] 92 (08/13 0507) Resp:  [11-20] 15 (08/13 0507) BP: (124-161)/(61-103) 157/72 (08/13 0507) SpO2:  [100 %] 100 % (08/13 0507) Weight:  [60.3 kg] 60.3 kg (08/13 0500) Physical Exam: General: Frail older gentleman, resting comfortably in bed, in no acute distress Cardiovascular: RRR Respiratory: Normal work of breathing on room air, lung fields clear to auscultation Abdomen: Soft, nontender, nondistended Extremities: Right upper extremity with significantly improving edema,  fingertips warm to touch. Arm elevated on a pillow.  Laboratory: Recent Labs  Lab 05/24/21 0222 05/25/21 0804 05/26/21 0016  WBC 14.0* 12.1* 11.0*  HGB 9.9* 9.4* 8.9*  HCT 30.3* 27.8* 27.1*  PLT 313 314 314   Recent Labs  Lab 05/25/21 0800 05/26/21 0016 05/27/21 0407  NA 134* 131* 133*  K 5.0 4.3 4.5  CL 97* 93* 92*  CO2 24 27 27   BUN 78* 32* 47*  CREATININE 9.25* 5.11* 6.90*  CALCIUM 8.9 8.8* 9.3  GLUCOSE 191* 156* 150*     Imaging/Diagnostic Tests: IR US Guide Vasc Access Right  Result Date: 05/26/2021 INDICATION: Right arm swelling, right forearm loop graft for dialysis EXAM: 1. Ultrasound-guided puncture of the AV loop graft in an antegrade fashion 2. Second ultrasound-guided puncture of the AV loop graft in an antegrade fashion 3. Venography of the AV loop graft fistula circuit including the central veins 4. Angioplasty of  a focal stenosis at the venous anastomosis of the av graft 5. Recanalization of occluded right brachiocephalic vein 6. Angioplasty of the right brachiocephalic vein MEDICATIONS: None. ANESTHESIA/SEDATION: Moderate Sedation Time: 82 minutes. The patient was administered 1.5 mg of Versed and 75 mg of fentanyl. The patient  was continuously monitored during the procedure by the interventional radiology nurse under my direct supervision. FLUOROSCOPY TIME:  Fluoroscopy Time: 7 minutes 48 seconds (38.3 mGy). COMPLICATIONS: None immediate. PROCEDURE: Informed written consent was obtained from the patient after a thorough discussion of the procedural risks, benefits and alternatives. All questions were addressed. Maximal Sterile Barrier Technique was utilized including caps, mask, sterile gowns, sterile gloves, sterile drape, hand hygiene and skin antiseptic. A timeout was performed prior to the initiation of the procedure. The patient was placed supine on the exam table. The right upper extremity was prepped and draped in the standard sterile fashion with inclusion of the AV form the graft within the sterile field. Ultrasound examination of the AV circuit was performed prior to access. This demonstrates an forearm loop graft extending from the right brachial artery to the right brachial vein. The arterial anastomosis was studied with ultrasound, which demonstrated that it was widely patent without stenosis. Ultrasound was then used to access the AV loop graft in an antegrade fashion near the arterial anastomosis. Venography of the AP circuit including the outflow vein and central veins was then performed. There is a forearm loop graft which extends from the right brachial artery to the right brachial vein. The loop graft is widely patent without evidence of narrowing or stenosis. At the venous anastomosis, there is moderate approximately 50% stenosis. The brachial and axillary and subclavian veins are widely patent. 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 There are large neck and chest wall collaterals, with the main collateral outflow being to the contralateral jugular vein into the SVC. The decision was then made to proceed with angioplasty of the above narrowings, with attempt to  recanalized the occluded right brachiocephalic vein. Using ultrasound guidance, a second puncture was made of the AV loop graft in antegrade fashion. Wire was advanced centrally, followed by dilation of the exercised to accommodate a short 7 [place] sheath. Angioplasty of the venous anastomosis was then performed of the 7 mm. Post angioplasty imaging of the mid outflow circuit demonstrates near complete resolution of the stenosis. Attention was then turned to the central veins. A long 5 French sheath was advanced coaxial E through the access site. Using combination of a NaviCross catheter and angled Glidewire, the right brachiocephalic vein occlusion was crossed. Location within patent SVC and IVC was confirmed with injection of contrast material. Serial angioplasty of the right brachiocephalic vein occlusion was then performed using 8, 12 and 14 mm balloons. Final angiography of the central veins status post angioplasty demonstrates mild residual stenosis with complete resolution of the neck and chest wall collaterals. There is fast flow from the fistula. Given this improved appearance, the decision was made to stop at this point. All catheters and wires were removed. Hemostasis was achieved at the access site using manual pressure. A clean dressing was placed. The patient tolerated the procedure well without immediate complication. IMPRESSION: 1. 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 There is a second approximately 50% stenosis at the level of venous anastomosis of the graft. 2. Successful recanalization of the occluded right brachiocephalic vein with  angioplasty up to 14 mm. There is marked improved luminal gain with near complete resolution of the neck and chest wall collaterals. There remains mild residual stenosis. If this lesion is found to recur in a short interval, consideration could be given towards stenting. 3. Successful angioplasty of a  focal stenosis at the venous anastomosis of the AV graft up to 7 mm with improved appearance and fast flow through the AV graft at the end of the procedure. ACCESS: The dialysis access is ready for immediate use. This access remains amenable to future percutaneous interventions as clinically indicated. Electronically Signed   By: Albin Felling M.D.   On: 05/26/2021 16:03   IR DIALY SHUNT INTRO NEEDLE/INTRACATH INITIAL W/IMG RIGHT  Result Date: 05/26/2021 INDICATION: Right arm swelling, right forearm loop graft for dialysis EXAM: 1. Ultrasound-guided puncture of the AV loop graft in an antegrade fashion 2. Second ultrasound-guided puncture of the AV loop graft in an antegrade fashion 3. Venography of the AV loop graft fistula circuit including the central veins 4. Angioplasty of a focal stenosis at the venous anastomosis of the av graft 5. Recanalization of occluded right brachiocephalic vein 6. Angioplasty of the right brachiocephalic vein MEDICATIONS: None. ANESTHESIA/SEDATION: Moderate Sedation Time: 82 minutes. The patient was administered 1.5 mg of Versed and 75 mg of fentanyl. The patient was continuously monitored during the procedure by the interventional radiology nurse under my direct supervision. FLUOROSCOPY TIME:  Fluoroscopy Time: 7 minutes 48 seconds (38.3 mGy). COMPLICATIONS: None immediate. PROCEDURE: Informed written consent was obtained from the patient after a thorough discussion of the procedural risks, benefits and alternatives. All questions were addressed. Maximal Sterile Barrier Technique was utilized including caps, mask, sterile gowns, sterile gloves, sterile drape, hand hygiene and skin antiseptic. A timeout was performed prior to the initiation of the procedure. The patient was placed supine on the exam table. The right upper extremity was prepped and draped in the standard sterile fashion with inclusion of the AV form the graft within the sterile field. Ultrasound examination of the  AV circuit was performed prior to access. This demonstrates an forearm loop graft extending from the right brachial artery to the right brachial vein. The arterial anastomosis was studied with ultrasound, which demonstrated that it was widely patent without stenosis. Ultrasound was then used to access the AV loop graft in an antegrade fashion near the arterial anastomosis. Venography of the AP circuit including the outflow vein and central veins was then performed. There is a forearm loop graft which extends from the right brachial artery to the right brachial vein. The loop graft is widely patent without evidence of narrowing or stenosis. At the venous anastomosis, there is moderate approximately 50% stenosis. The brachial and axillary and subclavian veins are widely patent. 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 There are large neck and chest wall collaterals, with the main collateral outflow being to the contralateral jugular vein into the SVC. The decision was then made to proceed with angioplasty of the above narrowings, with attempt to recanalized the occluded right brachiocephalic vein. Using ultrasound guidance, a second puncture was made of the AV loop graft in antegrade fashion. Wire was advanced centrally, followed by dilation of the exercised to accommodate a short 7 [place] sheath. Angioplasty of the venous anastomosis was then performed of the 7 mm. Post angioplasty imaging of the mid outflow circuit demonstrates near complete resolution of the stenosis. Attention was then turned to the  central veins. A long 5 French sheath was advanced coaxial E through the access site. Using combination of a NaviCross catheter and angled Glidewire, the right brachiocephalic vein occlusion was crossed. Location within patent SVC and IVC was confirmed with injection of contrast material. Serial angioplasty of the right brachiocephalic vein occlusion was then  performed using 8, 12 and 14 mm balloons. Final angiography of the central veins status post angioplasty demonstrates mild residual stenosis with complete resolution of the neck and chest wall collaterals. There is fast flow from the fistula. Given this improved appearance, the decision was made to stop at this point. All catheters and wires were removed. Hemostasis was achieved at the access site using manual pressure. A clean dressing was placed. The patient tolerated the procedure well without immediate complication. IMPRESSION: 1. 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 There is a second approximately 50% stenosis at the level of venous anastomosis of the graft. 2. Successful recanalization of the occluded right brachiocephalic vein with angioplasty up to 14 mm. There is marked improved luminal gain with near complete resolution of the neck and chest wall collaterals. There remains mild residual stenosis. If this lesion is found to recur in a short interval, consideration could be given towards stenting. 3. Successful angioplasty of a focal stenosis at the venous anastomosis of the AV graft up to 7 mm with improved appearance and fast flow through the AV graft at the end of the procedure. ACCESS: The dialysis access is ready for immediate use. This access remains amenable to future percutaneous interventions as clinically indicated. Electronically Signed   By: Albin Felling M.D.   On: 05/26/2021 16:03     Orvis Brill, DO 05/27/2021, 6:17 AM PGY-1, [place] Intern pager: [phone], text pages welcome

## 2021-05-27 NOTE — Progress Notes (Signed)
FPTS Interim Night Progress Note  S:Patient sleeping comfortably.  Rounded with primary night RN.  No concerns voiced.  No orders required.    O: Today's Vitals   05/26/21 2145 05/27/21 0017 05/27/21 0500 05/27/21 0507  BP: (!) 156/103 136/75  (!) 157/72  Pulse:  84  92  Resp:  13  15  Temp:    99.1 F (37.3 C)  TempSrc:    Oral  SpO2:  100%  100%  Weight:   60.3 kg   Height:      PainSc:          A/P: Continue current monitoring  Carollee Leitz MD PGY-3, [place] Medicine Service pager [phone]

## 2021-05-27 NOTE — TOC Progression Note (Addendum)
Transition of Care [facility]) - Progression Note    Patient Details  Name: Roger Vazquez MRN: [id number] Date of Birth: 1951/03/28  Transition [facility] CM/SW [place], [place] Phone Number: [phone]  05/27/2021, 11:29 AM  Clinical Narrative:     CSW followed up with [facility] and [facility] in regards to possible bed offers however CSW had to leave messages. Pt currently has no bed offers.  TOC team will continue to assist with discharge planning needs.    Expected Discharge Plan: [place] Barriers to Discharge: Continued Medical Work up  Expected Discharge Plan and Services Expected Discharge Plan: [place] In-house Referral: Clinical Social Work     Living arrangements for the past 2 months: Single Family Home                                       Social Determinants of Health (SDOH) Interventions    Readmission Risk Interventions No flowsheet data found.

## 2021-05-27 NOTE — Progress Notes (Signed)
[facility] Progress Note   Subjective:     Seen and examined at bedside. Patient reports mild SOB. Denies CP. Last HD 8/11-no evidence of fluid overload on exam and NAD. Will order PCXR. Plan for HD 8/15 but will dialyze sooner if needed.   Objective Vitals:   05/26/21 2145 05/27/21 0017 05/27/21 0500 05/27/21 0507  BP: (!) 156/103 136/75  (!) 157/72  Pulse:  84  92  Resp:  13  15  Temp:    99.1 F (37.3 C)  TempSrc:    Oral  SpO2:  100%  100%  Weight:   60.3 kg   Height:       Physical Exam General: Chronically ill-appearing male; NAD Heart: S1 and S2; 2/6 murmur Lungs: Clear throughout; No wheezing, rales, or rhonchi Abdomen: Soft, non-tender, (+) BS Extremities:No edme BLLE Dialysis Access: R AVG (+) Bruit/Thrill. Edema RUE imroving-R arm remains elevated   Filed Weights   05/25/21 0750 05/26/21 0336 05/27/21 0500  Weight: 60 kg 60.3 kg 60.3 kg    Intake/Output Summary (Last 24 hours) at 05/27/2021 1315 Last data filed at 05/27/2021 0600 Gross per 24 hour  Intake 240 ml  Output --  Net 240 ml    Additional Objective Labs: Basic Metabolic Panel: Recent Labs  Lab 05/25/21 0800 05/26/21 0016 05/27/21 0407  NA 134* 131* 133*  K 5.0 4.3 4.5  CL 97* 93* 92*  CO2 24 27 27   GLUCOSE 191* 156* 150*  BUN 78* 32* 47*  CREATININE 9.25* 5.11* 6.90*  CALCIUM 8.9 8.8* 9.3  PHOS 3.7 2.4* 3.6   Liver Function Tests: Recent Labs  Lab 05/25/21 0800 05/26/21 0016 05/27/21 0407  ALBUMIN 2.4* 2.5* 2.5*   No results for input(s): LIPASE, AMYLASE in the last 168 hours. CBC: Recent Labs  Lab 05/23/21 0509 05/24/21 0222 05/25/21 0804 05/26/21 0016 05/27/21 0407  WBC 15.5* 14.0* 12.1* 11.0* 9.7  HGB 10.4* 9.9* 9.4* 8.9* 9.2*  HCT 31.6* 30.3* 27.8* 27.1* 27.9*  MCV 102.9* 104.8* 101.8* 103.0* 101.8*  PLT 312 313 314 314 339   Blood Culture    Component Value Date/Time   SDES BLOOD LEFT FOREARM 05/18/2021 1955   SDES BLOOD LEFT HAND  05/18/2021 1955   SPECREQUEST  05/18/2021 1955    BOTTLES DRAWN AEROBIC AND ANAEROBIC Blood Culture adequate volume   SPECREQUEST  05/18/2021 1955    BOTTLES DRAWN AEROBIC AND ANAEROBIC Blood Culture adequate volume   CULT  05/18/2021 1955    NO GROWTH 5 DAYS Performed at [facility], [place] [address] [place], [place] [address]    CULT  05/18/2021 1955    NO GROWTH 5 DAYS Performed at [facility] [address], [place], [place] [address]    REPTSTATUS 05/24/2021 FINAL 05/18/2021 1955   REPTSTATUS 05/24/2021 FINAL 05/18/2021 1955    Cardiac Enzymes: No results for input(s): CKTOTAL, CKMB, CKMBINDEX, TROPONINI in the last 168 hours. CBG: Recent Labs  Lab 05/26/21 0723 05/26/21 1608 05/26/21 2058 05/27/21 0726 05/27/21 1153  GLUCAP 200* 184* 141* 161* 161*   Iron Studies: No results for input(s): IRON, TIBC, TRANSFERRIN, FERRITIN in the last 72 hours. Lab Results  Component Value Date   INR 1.1 05/18/2021   INR 1.0 04/27/2021   INR 1.08 07/31/2018   Studies/Results: IR US Guide Vasc Access Right  Result Date: 05/26/2021 INDICATION: Right arm swelling, right forearm loop graft for dialysis EXAM: 1. Ultrasound-guided puncture of the AV loop graft in an  antegrade fashion 2. Second ultrasound-guided puncture of the AV loop graft in an antegrade fashion 3. Venography of the AV loop graft fistula circuit including the central veins 4. Angioplasty of a focal stenosis at the venous anastomosis of the av graft 5. Recanalization of occluded right brachiocephalic vein 6. Angioplasty of the right brachiocephalic vein MEDICATIONS: None. ANESTHESIA/SEDATION: Moderate Sedation Time: 82 minutes. The patient was administered 1.5 mg of Versed and 75 mg of fentanyl. The patient was continuously monitored during the procedure by the interventional radiology nurse under my direct supervision. FLUOROSCOPY TIME:  Fluoroscopy Time: 7 minutes 48 seconds (38.3 mGy). COMPLICATIONS: None  immediate. PROCEDURE: Informed written consent was obtained from the patient after a thorough discussion of the procedural risks, benefits and alternatives. All questions were addressed. Maximal Sterile Barrier Technique was utilized including caps, mask, sterile gowns, sterile gloves, sterile drape, hand hygiene and skin antiseptic. A timeout was performed prior to the initiation of the procedure. The patient was placed supine on the exam table. The right upper extremity was prepped and draped in the standard sterile fashion with inclusion of the AV form the graft within the sterile field. Ultrasound examination of the AV circuit was performed prior to access. This demonstrates an forearm loop graft extending from the right brachial artery to the right brachial vein. The arterial anastomosis was studied with ultrasound, which demonstrated that it was widely patent without stenosis. Ultrasound was then used to access the AV loop graft in an antegrade fashion near the arterial anastomosis. Venography of the AP circuit including the outflow vein and central veins was then performed. There is a forearm loop graft which extends from the right brachial artery to the right brachial vein. The loop graft is widely patent without evidence of narrowing or stenosis. At the venous anastomosis, there is moderate approximately 50% stenosis. The brachial and axillary and subclavian veins are widely patent. 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 There are large neck and chest wall collaterals, with the main collateral outflow being to the contralateral jugular vein into the SVC. The decision was then made to proceed with angioplasty of the above narrowings, with attempt to recanalized the occluded right brachiocephalic vein. Using ultrasound guidance, a second puncture was made of the AV loop graft in antegrade fashion. Wire was advanced centrally, followed by dilation of the  exercised to accommodate a short 7 [place] sheath. Angioplasty of the venous anastomosis was then performed of the 7 mm. Post angioplasty imaging of the mid outflow circuit demonstrates near complete resolution of the stenosis. Attention was then turned to the central veins. A long 5 French sheath was advanced coaxial E through the access site. Using combination of a NaviCross catheter and angled Glidewire, the right brachiocephalic vein occlusion was crossed. Location within patent SVC and IVC was confirmed with injection of contrast material. Serial angioplasty of the right brachiocephalic vein occlusion was then performed using 8, 12 and 14 mm balloons. Final angiography of the central veins status post angioplasty demonstrates mild residual stenosis with complete resolution of the neck and chest wall collaterals. There is fast flow from the fistula. Given this improved appearance, the decision was made to stop at this point. All catheters and wires were removed. Hemostasis was achieved at the access site using manual pressure. A clean dressing was placed. The patient tolerated the procedure well without immediate complication. IMPRESSION: 1. 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 There is a second approximately 50% stenosis at the level of venous anastomosis of the graft. 2. Successful recanalization of the occluded right brachiocephalic vein with angioplasty up to 14 mm. There is marked improved luminal gain with near complete resolution of the neck and chest wall collaterals. There remains mild residual stenosis. If this lesion is found to recur in a short interval, consideration could be given towards stenting. 3. Successful angioplasty of a focal stenosis at the venous anastomosis of the AV graft up to 7 mm with improved appearance and fast flow through the AV graft at the end of the procedure. ACCESS: The dialysis access is ready for  immediate use. This access remains amenable to future percutaneous interventions as clinically indicated. Electronically Signed   By: Albin Felling M.D.   On: 05/26/2021 16:03   DG CHEST PORT 1 VIEW  Result Date: 05/27/2021 CLINICAL DATA:  Shortness of breath, weakness. EXAM: PORTABLE CHEST 1 VIEW COMPARISON:  Chest radiograph dated 05/18/2021. FINDINGS: The heart size is normal. Vascular calcifications are seen in the aortic arch. There is mild bibasilar atelectasis/airspace disease. A trace left pleural effusion may contribute. There is no right pleural effusion. There is no pneumothorax. A chronic left sixth rib fracture is noted. IMPRESSION: Mild bibasilar atelectasis/airspace disease. Electronically Signed   By: Zerita Boers M.D.   On: 05/27/2021 13:07   IR DIALY SHUNT INTRO NEEDLE/INTRACATH INITIAL W/IMG RIGHT  Result Date: 05/26/2021 INDICATION: Right arm swelling, right forearm loop graft for dialysis EXAM: 1. Ultrasound-guided puncture of the AV loop graft in an antegrade fashion 2. Second ultrasound-guided puncture of the AV loop graft in an antegrade fashion 3. Venography of the AV loop graft fistula circuit including the central veins 4. Angioplasty of a focal stenosis at the venous anastomosis of the av graft 5. Recanalization of occluded right brachiocephalic vein 6. Angioplasty of the right brachiocephalic vein MEDICATIONS: None. ANESTHESIA/SEDATION: Moderate Sedation Time: 82 minutes. The patient was administered 1.5 mg of Versed and 75 mg of fentanyl. The patient was continuously monitored during the procedure by the interventional radiology nurse under my direct supervision. FLUOROSCOPY TIME:  Fluoroscopy Time: 7 minutes 48 seconds (38.3 mGy). COMPLICATIONS: None immediate. PROCEDURE: Informed written consent was obtained from the patient after a thorough discussion of the procedural risks, benefits and alternatives. All questions were addressed. Maximal Sterile Barrier Technique was  utilized including caps, mask, sterile gowns, sterile gloves, sterile drape, hand hygiene and skin antiseptic. A timeout was performed prior to the initiation of the procedure. The patient was placed supine on the exam table. The right upper extremity was prepped and draped in the standard sterile fashion with inclusion of the AV form the graft within the sterile field. Ultrasound examination of the AV circuit was performed prior to access. This demonstrates an forearm loop graft extending from the right brachial artery to the right brachial vein. The arterial anastomosis was studied with ultrasound, which demonstrated that it was widely patent without stenosis. Ultrasound was then used to access the AV loop graft in an antegrade fashion near the arterial anastomosis. Venography of the AP circuit including the outflow vein and central veins was then performed. There is a forearm loop graft which extends from the right brachial artery to the right brachial vein. The loop graft is widely patent without evidence of narrowing or stenosis. At the venous anastomosis, there is moderate approximately 50% stenosis. The brachial and axillary and subclavian veins are widely patent. 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 There are large neck and chest wall collaterals, with the main collateral outflow being to the contralateral jugular vein into the SVC. The decision was then made to proceed with angioplasty of the above narrowings, with attempt to recanalized the occluded right brachiocephalic vein. Using ultrasound guidance, a second puncture was made of the AV loop graft in antegrade fashion. Wire was advanced centrally, followed by dilation of the exercised to accommodate a short 7 [place] sheath. Angioplasty of the venous anastomosis was then performed of the 7 mm. Post angioplasty imaging of the mid outflow circuit demonstrates near complete resolution of the stenosis.  Attention was then turned to the central veins. A long 5 French sheath was advanced coaxial E through the access site. Using combination of a NaviCross catheter and angled Glidewire, the right brachiocephalic vein occlusion was crossed. Location within patent SVC and IVC was confirmed with injection of contrast material. Serial angioplasty of the right brachiocephalic vein occlusion was then performed using 8, 12 and 14 mm balloons. Final angiography of the central veins status post angioplasty demonstrates mild residual stenosis with complete resolution of the neck and chest wall collaterals. There is fast flow from the fistula. Given this improved appearance, the decision was made to stop at this point. All catheters and wires were removed. Hemostasis was achieved at the access site using manual pressure. A clean dressing was placed. The patient tolerated the procedure well without immediate complication. IMPRESSION: 1. 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 There is a second approximately 50% stenosis at the level of venous anastomosis of the graft. 2. Successful recanalization of the occluded right brachiocephalic vein with angioplasty up to 14 mm. There is marked improved luminal gain with near complete resolution of the neck and chest wall collaterals. There remains mild residual stenosis. If this lesion is found to recur in a short interval, consideration could be given towards stenting. 3. Successful angioplasty of a focal stenosis at the venous anastomosis of the AV graft up to 7 mm with improved appearance and fast flow through the AV graft at the end of the procedure. ACCESS: The dialysis access is ready for immediate use. This access remains amenable to future percutaneous interventions as clinically indicated. Electronically Signed   By: Albin Felling M.D.   On: 05/26/2021 16:03    Medications:  sodium chloride Stopped (05/22/21  2314)    (feeding supplement) PROSource Plus  30 mL Oral TID BM   acetaminophen  500 mg Oral Q6H   aspirin  81 mg Oral Daily   calcium acetate  667 mg Oral TID WC   clopidogrel  75 mg Oral Daily   darbepoetin (ARANESP) injection - DIALYSIS  60 mcg Intravenous Q Fri-HD   doxercalciferol  1 mcg Intravenous Q M,W,F-HD   feeding supplement (NEPRO CARB STEADY)  237 mL Oral BID BM   heparin  5,000 Units Subcutaneous Q8H   hydrALAZINE  50 mg Oral Q8H   insulin aspart  0-9 Units Subcutaneous TID WC   multivitamin  1 tablet Oral QHS   polyethylene glycol  17 g Oral Daily   senna  1 tablet Oral Daily    Dialysis Orders: [facility] MWF 3hr, 400/Autoflow 1.5, 60.kg, 2K/3Ca, AVG,  --Heparin 2,000 units IV TIW - Hectoral 28mcg IV q HD - Mircera 37mcg IV q 2 weeks - last 7/13 -Venofer 100 q. dialysis  last dose to be given 8/10.   Assessment/Plan: 1. 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 Per primary/VVS.  2. AMS: Multifactorial. He has baseline dementia, H/O polysubstance abuse, sepsis from foot wound. Per primary  3. ESRD -MWF. Last HD on 8/11-C/o mild SOB today-euvolemic on exam and electrolytes are stable-ordered PCXR for today. Next HD planned for 05/29/2021. HD gap due to staffing issues in HD unit. No heparin. Will continue to monitor patient's volume status closely through the weekend. Depending on PCXR results, will perform HD sooner if clinically indicated. 4. Anemia - HGB now 9.2. Aranesp dose recently raised. Can resume Fe Load. Monitor trend. 5. Secondary hyperparathyroidism -Ca now 9.3-continue hectorol 1 mcg IV TIW. PO4 at goal. 6. HTN/volume - No evidence of volume excess. Bps stable. Continue UFP 2. UF as tolerated.  7. Nutrition - Renal Diet. Low albumin. Add protein supps. 8. DMT2-No diabetic meds on OP med list. Per primary. 9. Swelling RUE: Improving- shuntogram completed today: showed complete occlusion brachiocephalic vein, successful re-cannulation and PTA 22mm. Also  PTA 2nd stenosis in mid outflow to 56mm. Continue to keep RUE elevated.  Tobie Poet, NP [facility] 05/27/2021,1:15 PM  LOS: 8 days

## 2021-05-27 NOTE — Progress Notes (Addendum)
FPTS Interim Progress Note  S:Patient sleeping and resting comfortably with TV on.  Rounded with primary RN.  No concerns voiced.  No orders required.  Appreciated nightly round.  O: BP (!) 148/81 (BP Location: Left Arm)   Pulse 88   Temp 98.8 F (37.1 C) (Oral)   Resp 16   Ht 5\' 6"  (1.676 m)   Wt 60.3 kg   SpO2 100%   BMI 21.46 kg/m     A/P: No changes to current plan. See daily progress note.    Lyndee Hensen, DO PGY-3, [place] Intern pager [phone]

## 2021-05-28 LAB — GLUCOSE, CAPILLARY
Glucose-Capillary: 180 mg/dL — ABNORMAL HIGH (ref 70–99)
Glucose-Capillary: 168 mg/dL — ABNORMAL HIGH (ref 70–99)
Glucose-Capillary: 124 mg/dL — ABNORMAL HIGH (ref 70–99)
Glucose-Capillary: 129 mg/dL — ABNORMAL HIGH (ref 70–99)

## 2021-05-28 LAB — RENAL FUNCTION PANEL
Albumin: 2.3 g/dL — ABNORMAL LOW (ref 3.5–5.0)
Anion gap: 12 (ref 5–15)
BUN: 59 mg/dL — ABNORMAL HIGH (ref 8–23)
CO2: 28 mmol/L (ref 22–32)
Calcium: 9.2 mg/dL (ref 8.9–10.3)
Chloride: 93 mmol/L — ABNORMAL LOW (ref 98–111)
Creatinine, Ser: 8.38 mg/dL — ABNORMAL HIGH (ref 0.61–1.24)
GFR, Estimated: 6 mL/min — ABNORMAL LOW (ref >60)
Glucose, Bld: 157 mg/dL — ABNORMAL HIGH (ref 70–99)
Phosphorus: 4.3 mg/dL (ref 2.5–4.6)
Potassium: 4.9 mmol/L (ref 3.5–5.1)
Sodium: 133 mmol/L — ABNORMAL LOW (ref 135–145)

## 2021-05-28 LAB — CBC
HCT: 25.9 % — ABNORMAL LOW (ref 39.0–52.0)
Hemoglobin: 8.8 g/dL — ABNORMAL LOW (ref 13.0–17.0)
MCH: 34.2 pg — ABNORMAL HIGH (ref 26.0–34.0)
MCHC: 34 g/dL (ref 30.0–36.0)
MCV: 100.8 fL — ABNORMAL HIGH (ref 80.0–100.0)
Platelets: 321 10*3/uL (ref 150–400)
RBC: 2.57 MIL/uL — ABNORMAL LOW (ref 4.22–5.81)
RDW: 15.3 % (ref 11.5–15.5)
WBC: 10.8 10*3/uL — ABNORMAL HIGH (ref 4.0–10.5)
nRBC: 0 % (ref 0.0–0.2)

## 2021-05-28 MED ORDER — ALTEPLASE 2 MG IJ SOLR
2.0000 mg | Freq: Once | INTRAMUSCULAR | Status: DC | PRN
  Filled 2021-05-28: qty 2

## 2021-05-28 MED ORDER — HEPARIN SODIUM (PORCINE) 1000 UNIT/ML DIALYSIS
1000.0000 [IU] | INTRAMUSCULAR | Status: DC | PRN
  Filled 2021-05-28: qty 1

## 2021-05-28 MED ORDER — PENTAFLUOROPROP-TETRAFLUOROETH EX AERO: 1.0000 "application " | INHALATION_SPRAY | CUTANEOUS | Status: DC | PRN

## 2021-05-28 MED ORDER — LIDOCAINE HCL (PF) 1 % IJ SOLN: 5.0000 mL | INTRAMUSCULAR | Status: DC | PRN

## 2021-05-28 MED ORDER — SODIUM CHLORIDE 0.9 % IV SOLN: 100.0000 mL | INTRAVENOUS | Status: DC | PRN

## 2021-05-28 MED ORDER — LIDOCAINE-PRILOCAINE 2.5-2.5 % EX CREA: 1.0000 "application " | TOPICAL_CREAM | CUTANEOUS | Status: DC | PRN

## 2021-05-28 MED ORDER — CHLORHEXIDINE GLUCONATE CLOTH 2 % EX PADS
6.0000 | MEDICATED_PAD | Freq: Every day | CUTANEOUS | Status: DC
  Administered 2021-05-30 – 2021-06-01 (×3): 6 via TOPICAL

## 2021-05-28 NOTE — Progress Notes (Signed)
[facility] Progress Note   Subjective:    Seen and examined patient at bedside. He denies SOB and CP. Appears comfortable. Plan for HD 05/29/21. Awaiting SNF placement.  Objective Vitals:   05/27/21 2100 05/28/21 0043 05/28/21 0409 05/28/21 1008  BP: (!) 148/81 (!) 150/74 (!) 147/67 (!) 147/67  Pulse: 88 93 98   Resp: 16 17 18 19   Temp: 98.8 F (37.1 C) 98.6 F (37 C) 98.8 F (37.1 C) 98.4 F (36.9 C)  TempSrc: Oral Oral Oral   SpO2: 100% 100% 97% 97%  Weight:   63.2 kg   Height:       Physical Exam General: Chronically ill-appearing male; NAD Heart: S1 and S2; 2/6 murmur Lungs: Clear throughout; No wheezing, rales, or rhonchi Abdomen: Soft, non-tender, (+) BS Extremities:No edme BLLE Dialysis Access: R AVG (+) Bruit/Thrill. Edema RUE imroving-R arm remains elevated   Filed Weights   05/26/21 0336 05/27/21 0500 05/28/21 0409  Weight: 60.3 kg 60.3 kg 63.2 kg    Intake/Output Summary (Last 24 hours) at 05/28/2021 1345 Last data filed at 05/28/2021 0900 Gross per 24 hour  Intake 240 ml  Output --  Net 240 ml    Additional Objective Labs: Basic Metabolic Panel: Recent Labs  Lab 05/26/21 0016 05/27/21 0407 05/28/21 0219  NA 131* 133* 133*  K 4.3 4.5 4.9  CL 93* 92* 93*  CO2 27 27 28   GLUCOSE 156* 150* 157*  BUN 32* 47* 59*  CREATININE 5.11* 6.90* 8.38*  CALCIUM 8.8* 9.3 9.2  PHOS 2.4* 3.6 4.3   Liver Function Tests: Recent Labs  Lab 05/26/21 0016 05/27/21 0407 05/28/21 0219  ALBUMIN 2.5* 2.5* 2.3*   No results for input(s): LIPASE, AMYLASE in the last 168 hours. CBC: Recent Labs  Lab 05/24/21 0222 05/25/21 0804 05/26/21 0016 05/27/21 0407 05/28/21 0219  WBC 14.0* 12.1* 11.0* 9.7 10.8*  HGB 9.9* 9.4* 8.9* 9.2* 8.8*  HCT 30.3* 27.8* 27.1* 27.9* 25.9*  MCV 104.8* 101.8* 103.0* 101.8* 100.8*  PLT 313 314 314 339 321   Blood Culture    Component Value Date/Time   SDES BLOOD LEFT FOREARM 05/18/2021 1955   SDES BLOOD LEFT HAND  05/18/2021 1955   SPECREQUEST  05/18/2021 1955    BOTTLES DRAWN AEROBIC AND ANAEROBIC Blood Culture adequate volume   SPECREQUEST  05/18/2021 1955    BOTTLES DRAWN AEROBIC AND ANAEROBIC Blood Culture adequate volume   CULT  05/18/2021 1955    NO GROWTH 5 DAYS Performed at [facility], [place] [address] [place], [place] [address]    CULT  05/18/2021 1955    NO GROWTH 5 DAYS Performed at [facility] [address], [place], [place] [address]    REPTSTATUS 05/24/2021 FINAL 05/18/2021 1955   REPTSTATUS 05/24/2021 FINAL 05/18/2021 1955    Cardiac Enzymes: No results for input(s): CKTOTAL, CKMB, CKMBINDEX, TROPONINI in the last 168 hours. CBG: Recent Labs  Lab 05/27/21 1153 05/27/21 1628 05/27/21 2157 05/28/21 0740 05/28/21 1149  GLUCAP 161* 172* 155* 180* 168*   Iron Studies: No results for input(s): IRON, TIBC, TRANSFERRIN, FERRITIN in the last 72 hours. Lab Results  Component Value Date   INR 1.1 05/18/2021   INR 1.0 04/27/2021   INR 1.08 07/31/2018   Studies/Results: DG CHEST PORT 1 VIEW  Result Date: 05/27/2021 CLINICAL DATA:  Shortness of breath, weakness. EXAM: PORTABLE CHEST 1 VIEW COMPARISON:  Chest radiograph dated 05/18/2021. FINDINGS: The heart size is normal. Vascular calcifications are seen  in the aortic arch. There is mild bibasilar atelectasis/airspace disease. A trace left pleural effusion may contribute. There is no right pleural effusion. There is no pneumothorax. A chronic left sixth rib fracture is noted. IMPRESSION: Mild bibasilar atelectasis/airspace disease. Electronically Signed   By: Zerita Boers M.D.   On: 05/27/2021 13:07    Medications:  sodium chloride Stopped (05/22/21 2314)    (feeding supplement) PROSource Plus  30 mL Oral TID BM   acetaminophen  500 mg Oral Q6H   aspirin  81 mg Oral Daily   calcium acetate  667 mg Oral TID WC   clopidogrel  75 mg Oral Daily   darbepoetin (ARANESP) injection - DIALYSIS  60 mcg Intravenous Q  Fri-HD   doxercalciferol  1 mcg Intravenous Q M,W,F-HD   feeding supplement (NEPRO CARB STEADY)  237 mL Oral BID BM   heparin  5,000 Units Subcutaneous Q8H   hydrALAZINE  50 mg Oral Q8H   insulin aspart  0-9 Units Subcutaneous TID WC   multivitamin  1 tablet Oral QHS   polyethylene glycol  17 g Oral Daily   senna  1 tablet Oral Daily    Dialysis Orders: [facility] MWF 3hr, 400/Autoflow 1.5, 60.kg, 2K/3Ca, AVG,  --Heparin 2,000 units IV TIW - Hectoral 60mcg IV q HD - Mircera 69mcg IV q 2 weeks - last 7/13 -Venofer 100 q. dialysis last dose to be given 8/10.   Assessment/Plan: 1. 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 Per primary/VVS.  2. AMS: Multifactorial. He has baseline dementia, H/O polysubstance abuse, sepsis from foot wound. Per primary  3. ESRD -MWF. Last HD on 8/11-C/o mild SOB today-euvolemic on exam and electrolytes are stable-ordered PCXR yesterday showed trace L pleural effusion and mild bibasilar atelectasis-breathing appears improved today. Next HD planned for 05/29/2021.  4. Anemia - HGB now 8.8. Aranesp dose recently raised. Can resume Fe Load. Monitor trend. 5. Secondary hyperparathyroidism -Ca now 9.3-continue hectorol 1 mcg IV TIW. PO4 at goal. 6. HTN/volume - No evidence of volume excess. Bps stable. Continue UFP 2. UF as tolerated.  7. Nutrition - Renal Diet. Low albumin. Add protein supps. 8. DMT2-No diabetic meds on OP med list. Per primary. 9. Swelling RUE: Improving- shuntogram completed today: showed complete occlusion brachiocephalic vein, successful re-cannulation and PTA 16mm. Also PTA 2nd stenosis in mid outflow to 5mm. Swelling improving-Continue to keep RUE elevated.  Tobie Poet, NP [facility] 05/28/2021,1:45 PM  LOS: 9 days

## 2021-05-28 NOTE — Progress Notes (Signed)
Family Medicine Teaching Service Daily Progress Note Intern Pager: [phone]  Patient name: Roger Vazquez Medical record number: [id number] Date of birth: 12/31/1950 Age: 70 y.o. Gender: male  Primary Care Provider: [facility] Consultants: Nephro, VVS, IR Code Status: Full  Pt Overview and Major Events to Date:  8/5- admitted 8/6- recurrent hypoglycemia 8/8- LLE transmetatarsal amputation 8/12- shuntogram with IR with angioplasty of R brachiocephalic vein  Assessment and Plan: Roger Vazquez is a 64-yr-old male who presented with worsening gangrene of his left foot after a recent hospitalization for amputaiton of his L second toe. He is now post-op day six from a transmetatarsal amputation. History is significant for ESRD on HF MWF, hx of lung CA, PAD, HTN, DM2, and polysubstance abuse.  Patient is now medically stable and awaiting SNF placement.   Gangrene of L foot, s/p transmetatarsal amputation Post-op day 6. Pain well-controlled. PRN oxycodone d/c'd this am. - Tylenol 500mg  q6h - PT/OT - Awaiting SNF  RUE Swelling s/p angioplasty Improved. NO pain. - Keep RUE elevated  AMS, resolved Mentating at baseline. - Delirium precautions - Okay to liberalize diet as mental status has improved   ESRD on HD Dialysis scheduled for tomorrow. Nephro obtained CXR yesterday to evaluate fluid status. Bibasilar atelectasis. Not fluid overloaded on exam.  - Dialysis per nephro - RFP on dialysis days - Incentive spirometer   Constipation, resolved - MiraLAX and Senna daily  HTN, chronic, stable  BP 147/67 - Continue hydralazine 50mg  TID  DM2, chronic, stable Glucose 155-157. -Continue CBG, sSSI   FEN/GI: Regular diet  PPx: Heparin Dispo:SNF today. Barriers include pending placement.   Subjective:  Roger Vazquez reports feeling well this morning. He has no acute complaints at this time. Had two soft bowel movements yesterday. Denies any pain in LLE or RUE at this  time. No CP/SOB.   Objective: Temp:  [98.6 F (37 C)-99.1 F (37.3 C)] 98.8 F (37.1 C) (08/14 0409) Pulse Rate:  [88-98] 98 (08/14 0409) Resp:  [16-18] 18 (08/14 0409) BP: (147-172)/(67-86) 147/67 (08/14 0409) SpO2:  [97 %-100 %] 97 % (08/14 0409) Weight:  [63.2 kg] 63.2 kg (08/14 0409) Physical Exam: General: Tired-appearing older gentleman lying in bed watching tv Cardiovascular: Irregular sounding, transmitted fistula sounds Respiratory: Normal WOB on RA, fine crackles bibasilarly Abdomen: Soft, non-tender, non-distended, bowel sounds nl Extremities: LLE with dressing in place, no edema, erythema, streaking, or tenderness proximally. RUE remains diffusely swollen but is non-tender and not warm to touch  Laboratory: Recent Labs  Lab 05/26/21 0016 05/27/21 0407 05/28/21 0219  WBC 11.0* 9.7 10.8*  HGB 8.9* 9.2* 8.8*  HCT 27.1* 27.9* 25.9*  PLT 314 339 321   Recent Labs  Lab 05/26/21 0016 05/27/21 0407 05/28/21 0219  NA 131* 133* 133*  K 4.3 4.5 4.9  CL 93* 92* 93*  CO2 27 27 28   BUN 32* 47* 59*  CREATININE 5.11* 6.90* 8.38*  CALCIUM 8.8* 9.3 9.2  GLUCOSE 156* 150* 157*    Imaging/Diagnostic Tests: CXR  FINDINGS: The heart size is normal. Vascular calcifications are seen in the aortic arch. There is mild bibasilar atelectasis/airspace disease. A trace left pleural effusion may contribute. There is no right pleural effusion. There is no pneumothorax. A chronic left sixth rib fracture is noted. IMPRESSION: Mild bibasilar atelectasis/airspace disease.  Eppie Gibson, MD 05/28/2021, 8:57 AM PGY-1, [place] Intern pager: [phone], text pages welcome

## 2021-05-29 DIAGNOSIS — N186 End stage renal disease: Secondary | ICD-10-CM | POA: Diagnosis not present

## 2021-05-29 DIAGNOSIS — Z89439 Acquired absence of unspecified foot: Secondary | ICD-10-CM | POA: Diagnosis not present

## 2021-05-29 LAB — GLUCOSE, CAPILLARY
Glucose-Capillary: 161 mg/dL — ABNORMAL HIGH (ref 70–99)
Glucose-Capillary: 170 mg/dL — ABNORMAL HIGH (ref 70–99)
Glucose-Capillary: 126 mg/dL — ABNORMAL HIGH (ref 70–99)
Glucose-Capillary: 137 mg/dL — ABNORMAL HIGH (ref 70–99)
Glucose-Capillary: 89 mg/dL (ref 70–99)

## 2021-05-29 LAB — MAGNESIUM: Magnesium: 2.8 mg/dL — ABNORMAL HIGH (ref 1.7–2.4)

## 2021-05-29 LAB — RENAL FUNCTION PANEL
Albumin: 2.2 g/dL — ABNORMAL LOW (ref 3.5–5.0)
Anion gap: 10 (ref 5–15)
BUN: 76 mg/dL — ABNORMAL HIGH (ref 8–23)
CO2: 28 mmol/L (ref 22–32)
Calcium: 9.2 mg/dL (ref 8.9–10.3)
Chloride: 95 mmol/L — ABNORMAL LOW (ref 98–111)
Creatinine, Ser: 9.77 mg/dL — ABNORMAL HIGH (ref 0.61–1.24)
GFR, Estimated: 5 mL/min — ABNORMAL LOW (ref >60)
Glucose, Bld: 160 mg/dL — ABNORMAL HIGH (ref 70–99)
Phosphorus: 5 mg/dL — ABNORMAL HIGH (ref 2.5–4.6)
Potassium: 5.5 mmol/L — ABNORMAL HIGH (ref 3.5–5.1)
Sodium: 133 mmol/L — ABNORMAL LOW (ref 135–145)

## 2021-05-29 LAB — HEPATITIS B SURFACE ANTIGEN: Hepatitis B Surface Ag: NONREACTIVE

## 2021-05-29 MED ORDER — ALBUMIN HUMAN 25 % IV SOLN
INTRAVENOUS | Status: AC
  Administered 2021-05-29: 25 g via INTRAVENOUS
  Filled 2021-05-29: qty 100

## 2021-05-29 MED ORDER — ALBUMIN HUMAN 25 % IV SOLN: 25.0000 g | Freq: Once | INTRAVENOUS | Status: AC

## 2021-05-29 MED ORDER — DARBEPOETIN ALFA 60 MCG/0.3ML IJ SOSY
60.0000 ug | PREFILLED_SYRINGE | INTRAMUSCULAR | Status: DC
  Administered 2021-05-29: 60 ug via INTRAVENOUS

## 2021-05-29 MED ORDER — DARBEPOETIN ALFA 60 MCG/0.3ML IJ SOSY
PREFILLED_SYRINGE | INTRAMUSCULAR | Status: AC
  Filled 2021-05-29: qty 0.3

## 2021-05-29 MED ORDER — DOXERCALCIFEROL 4 MCG/2ML IV SOLN
INTRAVENOUS | Status: AC
  Filled 2021-05-29: qty 2

## 2021-05-29 NOTE — Progress Notes (Signed)
Pt reports he doesn't know what is going on with his body.  Reports pain left foot 10/10 and nausea.  Declines medication for nausea.  VSS, CBG 161.  Scheduled tylenol given per orders.  MD paged for additional pain medication per patient request.

## 2021-05-29 NOTE — Progress Notes (Signed)
PT Cancellation Note  Patient Details Name: TYGER WICHMAN MRN: [id number] DOB: [birth year]   Cancelled Treatment:    Reason Eval/Treat Not Completed: Patient at procedure or test/unavailable  Currently in HD;  Will follow up later today as time allows;  Otherwise, will follow up for PT tomorrow;   Thank you,  Roney Marion, PT  Acute Rehabilitation Services Pager [phone] Office [phone]    Colletta [place] 05/29/2021, 12:43 PM

## 2021-05-29 NOTE — Progress Notes (Signed)
[facility] Progress Note   Subjective:    Seen in room. No new complaints this am. Denies cp, sob. Orders   Objective Vitals:   05/28/21 2002 05/28/21 2321 05/29/21 0435 05/29/21 0745  BP: 137/66 (!) 163/72 (!) 166/74 (!) 161/75  Pulse: 93 97 92 92  Resp: 18 16 16    Temp: 98.6 F (37 C) 98.8 F (37.1 C) 98.2 F (36.8 C) 98.1 F (36.7 C)  TempSrc: Oral Oral Oral Oral  SpO2: 98% 98% 98%   Weight:   58.9 kg   Height:       Physical Exam General: Frail appearing elderly man, nad  Heart: S1 and S2; 2/6 murmur Lungs: Clear throughout; No wheezing, rales, or rhonchi Abdomen: Soft, non-tender Extremities:No  sig LE edema; s/p L TMA  Dialysis Access: RUE AVG+bruit; RA swelling noted   Filed Weights   05/27/21 0500 05/28/21 0409 05/29/21 0435  Weight: 60.3 kg 63.2 kg 58.9 kg    Intake/Output Summary (Last 24 hours) at 05/29/2021 0955 Last data filed at 05/28/2021 2300 Gross per 24 hour  Intake 360 ml  Output --  Net 360 ml     Additional Objective Labs: Basic Metabolic Panel: Recent Labs  Lab 05/27/21 0407 05/28/21 0219 05/29/21 0149  NA 133* 133* 133*  K 4.5 4.9 5.5*  CL 92* 93* 95*  CO2 27 28 28   GLUCOSE 150* 157* 160*  BUN 47* 59* 76*  CREATININE 6.90* 8.38* 9.77*  CALCIUM 9.3 9.2 9.2  PHOS 3.6 4.3 5.0*    Liver Function Tests: Recent Labs  Lab 05/27/21 0407 05/28/21 0219 05/29/21 0149  ALBUMIN 2.5* 2.3* 2.2*    No results for input(s): LIPASE, AMYLASE in the last 168 hours. CBC: Recent Labs  Lab 05/24/21 0222 05/25/21 0804 05/26/21 0016 05/27/21 0407 05/28/21 0219  WBC 14.0* 12.1* 11.0* 9.7 10.8*  HGB 9.9* 9.4* 8.9* 9.2* 8.8*  HCT 30.3* 27.8* 27.1* 27.9* 25.9*  MCV 104.8* 101.8* 103.0* 101.8* 100.8*  PLT 313 314 314 339 321    Blood Culture    Component Value Date/Time   SDES BLOOD LEFT FOREARM 05/18/2021 1955   SDES BLOOD LEFT HAND 05/18/2021 1955   SPECREQUEST  05/18/2021 1955    BOTTLES DRAWN AEROBIC AND  ANAEROBIC Blood Culture adequate volume   SPECREQUEST  05/18/2021 1955    BOTTLES DRAWN AEROBIC AND ANAEROBIC Blood Culture adequate volume   CULT  05/18/2021 1955    NO GROWTH 5 DAYS Performed at [facility], [place] [address] [place], [place] [address]    CULT  05/18/2021 1955    NO GROWTH 5 DAYS Performed at [facility] [address] [place], [place] [address]    REPTSTATUS 05/24/2021 FINAL 05/18/2021 1955   REPTSTATUS 05/24/2021 FINAL 05/18/2021 1955    Cardiac Enzymes: No results for input(s): CKTOTAL, CKMB, CKMBINDEX, TROPONINI in the last 168 hours. CBG: Recent Labs  Lab 05/28/21 1149 05/28/21 1612 05/28/21 2110 05/29/21 0432 05/29/21 0744  GLUCAP 168* 124* 129* 161* 170*    Iron Studies: No results for input(s): IRON, TIBC, TRANSFERRIN, FERRITIN in the last 72 hours. Lab Results  Component Value Date   INR 1.1 05/18/2021   INR 1.0 04/27/2021   INR 1.08 07/31/2018   Studies/Results: DG CHEST PORT 1 VIEW  Result Date: 05/27/2021 CLINICAL DATA:  Shortness of breath, weakness. EXAM: PORTABLE CHEST 1 VIEW COMPARISON:  Chest radiograph dated 05/18/2021. FINDINGS: The heart size is normal. Vascular calcifications are seen in the  aortic arch. There is mild bibasilar atelectasis/airspace disease. A trace left pleural effusion may contribute. There is no right pleural effusion. There is no pneumothorax. A chronic left sixth rib fracture is noted. IMPRESSION: Mild bibasilar atelectasis/airspace disease. Electronically Signed   By: Zerita Boers M.D.   On: 05/27/2021 13:07    Medications:  sodium chloride Stopped (05/22/21 2314)   sodium chloride     sodium chloride      (feeding supplement) PROSource Plus  30 mL Oral TID BM   acetaminophen  500 mg Oral Q6H   aspirin  81 mg Oral Daily   calcium acetate  667 mg Oral TID WC   Chlorhexidine Gluconate Cloth  6 each Topical Q0600   clopidogrel  75 mg Oral Daily   darbepoetin (ARANESP) injection - DIALYSIS  60  mcg Intravenous Q Mon-HD   doxercalciferol  1 mcg Intravenous Q M,W,F-HD   feeding supplement (NEPRO CARB STEADY)  237 mL Oral BID BM   heparin  5,000 Units Subcutaneous Q8H   hydrALAZINE  50 mg Oral Q8H   insulin aspart  0-9 Units Subcutaneous TID WC   multivitamin  1 tablet Oral QHS   polyethylene glycol  17 g Oral Daily   senna  1 tablet Oral Daily    Dialysis Orders: [facility] MWF 3hr, 400/Autoflow 1.5, 60.kg, 2K/3Ca, AVG,  --Heparin 2,000 units IV TIW - Hectoral 82mcg IV q HD - Mircera 49mcg IV q 2 weeks - last 7/13 -Venofer 100 q. dialysis last dose to be given 8/10.   Assessment/Plan: 1. 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 Per primary/VVS.  2. AMS: Multifactorial. He has baseline dementia, H/O polysubstance abuse, sepsis from foot wound. Per primary  3. ESRD -MWF. Last HD on 8/11 Next HD planned for 05/29/2021.  4. Anemia - Hgb 8.8  Aranesp 60 q week starting 8/15.  5. Secondary hyperparathyroidism -Ca/Phos ok. Continue hectorol 1 mcg IV TIW. 6. HTN/volume - BP borderline. UF as tolerated-goal 2-3L today  7. Nutrition - Renal Diet. Low albumin. Add protein supps. 8. DMT2-No diabetic meds on OP med list. Per primary. 9. RUE swelling : Improving- s/p fistulogram with successful PTA per IR on 8/12  10. Dispo- Awaiting SNF placement   [facility] 05/29/2021,9:56 AM

## 2021-05-29 NOTE — Progress Notes (Signed)
OT Cancellation Note  Patient Details Name: Roger Vazquez MRN: [id number] DOB: 01/05/1951   Cancelled Treatment:    Reason Eval/Treat Not Completed: Patient at procedure or test/ unavailable SW requests updated therapy notes for SNF auth. On attempts for OT session, pt leaving unit for HD. Will follow-up again as schedule permits.   Layla Maw 05/29/2021, 12:36 PM

## 2021-05-29 NOTE — Plan of Care (Signed)

## 2021-05-29 NOTE — Progress Notes (Signed)
Family Medicine Teaching Service Daily Progress Note Intern Pager: [phone]  Patient name: Roger Vazquez Medical record number: [id number] Date of birth: 11-04-1950 Age: 70 y.o. Gender: male  Primary Care Provider: [facility] Consultants: Nephro, IR, VVS Code Status: Full  Pt Overview and Major Events to Date:  8/5- admitted 8/6- recurrent hypoglycemia 8/8- LLE transmetatarsal amputation 8/12- shuntogram with IR, angioplasty of R brachiocephalic vein  Assessment and Plan: Roger Vazquez is a 70yr old male who presented with worsening gangrene of his L foot after a recent hospitalization for amputation of his L second toe. He is now post-op day seven from a transmetatarsal amputation. Hx significant for ESRD on HD MWF, hx of lung CA, PAD, HTN, DM2, and poly substance abuse. Patient is medically stable and awaiting SNF.  Gangrene of L foot, s/p transmetatarsal amputation POD 7. Pain well-controlled. Had an episode of transient pain last night, seen by my colleague Dr. Madison Hickman with no acute findings.  - Tylenol 500mg  q6h - PT/OT - Awaiting SNF  ESRD on HD Hyperkalemia Dialysis in hospital today. K elevated to 5.5 this am.  - Dialysis per nephro - RFP on dialysis days - Renal diet  RUE swelling s/p angioplasty Improved. No pain. Still residual swelling noted. - Keep elevated  HTN, chronic, stable BP 130-160/60-70. - Continue hydralazine 50mg  TID  DM2, chronic, stable  Glucose 129-161. - Continue sSSI  FEN/GI: Regular diet  PPx: Heparin Dispo:SNF today. Barriers include HD this am.   Subjective:  Roger Vazquez has no acute complaints this morning.  He denies any pain in his right upper extremity or left lower extremity.  He denies any chest pain, shortness of breath.  Objective: Temp:  [98.1 F (36.7 C)-98.9 F (37.2 C)] 98.1 F (36.7 C) (08/15 0745) Pulse Rate:  [89-97] 92 (08/15 0745) Resp:  [16-19] 16 (08/15 0435) BP: (137-166)/(66-75) 161/75 (08/15  0745) SpO2:  [97 %-100 %] 98 % (08/15 0435) Weight:  [58.9 kg] 58.9 kg (08/15 0435) Physical Exam: General: Patient sleeping, arouses easily to voice, NAD Cardiovascular: Regular rate, regular rhythm, transmitted fistula sounds Respiratory: Lung fields clear to auscultation, normal work of breathing on room air Abdomen: Soft, nontender, nondistended Extremities: Right upper extremity with residual edema, arm was not elevated during interview.  Left lower extremity with dressing in place, no erythema, streaking, tenderness extending proximally  Laboratory: Recent Labs  Lab 05/26/21 0016 05/27/21 0407 05/28/21 0219  WBC 11.0* 9.7 10.8*  HGB 8.9* 9.2* 8.8*  HCT 27.1* 27.9* 25.9*  PLT 314 339 321   Recent Labs  Lab 05/27/21 0407 05/28/21 0219 05/29/21 0149  NA 133* 133* 133*  K 4.5 4.9 5.5*  CL 92* 93* 95*  CO2 27 28 28   BUN 47* 59* 76*  CREATININE 6.90* 8.38* 9.77*  CALCIUM 9.3 9.2 9.2  GLUCOSE 150* 157* 160*    Imaging/Diagnostic Tests: No new imaging, tests  Eppie Gibson, MD 05/29/2021, 8:34 AM PGY-1, [place] Intern pager: [phone], text pages welcome

## 2021-05-29 NOTE — TOC Progression Note (Addendum)
Transition of Care [facility]) - Progression Note    Patient Details  Name: TEON HUDNALL MRN: [id number] Date of Birth: December 16, 1950  Transition of [facility] CM/SW [place], [place] Phone Number: 05/29/2021, 10:02 AM  Clinical Narrative:     Update- CSW spoke with Marya Amsler and Pam renal navigator. Patients HD clinic [facility] be [facility]. Patients HD days are Monday,Wednesday, and Friday.First day patient will need to be there at 11:15am. Following days patient will need to arrive at 11:50am for a 12:10pm seat time. CSW informed Facility gets [place]. SNF awaiting PT note so that they can start insurance authorization for patient.CSW called patients sister [name] and informed her with HD information. CSW will continue to follow and assist with dc planning needs. Patient was updated.  CSW spoke with Narda Rutherford with [facility] who confirmed she can make SNF bed offer with patients HD Center is switched. CSW spoke with patients sister [name] who gave permission to switch patients HD centers and accepts SNF bed offer for patient. CSW called renal navigator and left voicemail. CSW called Pam with HD who confirmed she will contact [place] renal navigator to try and get patients HD center switched. Pam confirmed she will have Greg renal navigator to call CSW back with HD information. CSW will continue to follow and assist with dc planning needs.  Expected Discharge Plan: [place] Barriers to Discharge: Continued Medical Work up  Expected Discharge Plan and Services Expected Discharge Plan: [place] In-house Referral: Clinical Social Work     Living arrangements for the past 2 months: Single Family Home                                       Social Determinants of Health (SDOH) Interventions    Readmission Risk Interventions No flowsheet data found.

## 2021-05-29 NOTE — Progress Notes (Signed)
FPTS Interim Night Progress Note  S:Patient sleeping comfortably.  Rounded with primary night RN.  No concerns voiced.  No orders required.    O: Today's Vitals   05/28/21 1537 05/28/21 1538 05/28/21 2002 05/28/21 2321  BP: (!) 147/72 (!) 147/72 137/66 (!) 163/72  Pulse:  89 93 97  Resp:  17 18 16   Temp:  98.4 F (36.9 C) 98.6 F (37 C) 98.8 F (37.1 C)  TempSrc:  Oral Oral Oral  SpO2:  100% 98% 98%  Weight:      Height:      PainSc:          A/P: Continue current management  Carollee Leitz MD PGY-3, [place] Medicine Service pager [phone]

## 2021-05-29 NOTE — Discharge Instructions (Addendum)
Dear Roger Vazquez,  Thank you for letting us participate in your care. You were hospitalized for worsening pain in your L foot and diagnosed with gangrene of the foot. You were treated with an amputation further up your foot.   POST-HOSPITAL & CARE INSTRUCTIONS Our speech language pathologists felt that you would benefit from eating soft foods to reduce the risk of choking on your food. Your primary doctor can talk with you about the safest options. It is possible that your body will not be able to heal your surgical wound and that you may require another amputation higher up your leg. Keep an eye on your wound and look for new or worsening redness or darkening of the skin.  Go to your follow up appointments (listed below)   DOCTOR'S APPOINTMENT   Future Appointments  Date Time Provider Tok  06/06/2021  9:30 AM [facility]     Take care and be well!  [facility]  [place], [place] [address] [phone]

## 2021-05-29 NOTE — Progress Notes (Signed)
  Speech Language Pathology Treatment: Dysphagia  Patient Details Name: Roger Vazquez MRN: [id number] DOB: 1951-05-26 Today's Date: 05/29/2021 Time: 0850-0906 SLP Time Calculation (min) (ACUTE ONLY): 16 min  Assessment / Plan / Recommendation Clinical Impression  Pt's diet was advanced from finely chopped textures to regular solids by MD on 8/12. Per chart his lung sounds remain clear and he is afebrile, although intake documented at meal times has been fluctuating, but relatively low since that date. RN reports some pocketing of foods. He has appropriate oral preparation and clearance of graham crackers when given a little extra time and when broken into smaller, bite-sized pieces by SLP. Liquid washes were also utilized to facilitate oral clearance with no oral residue noted when alternating in a sip every few bites. Recommend adjusting diet to be at least a little softer - will change to Dys 3 (mechanical soft) and thin liquids.    HPI HPI: Roger Vazquez is a 70 y.o. male who presented with AMS and worsening left foot gangrene s/p L transmetatarsal amputation 8/8. Initial swallow eval 8/7 WFL but recommending Dys 3 solids given mentation and missing dentition. SLP asked to re-evaluate as pt has been requiring increased sedation. PMHx significant for ESRD on HD MWF, hx of lung cancer, PAD, HTN, T2DM, and polysubstance abuse.      SLP Plan  Continue with current plan of care       Recommendations  Diet recommendations: Dysphagia 3 (mechanical soft);Thin liquid Liquids provided via: Cup;Straw Medication Administration: Whole meds with liquid Supervision: Staff to assist with self feeding Compensations: Minimize environmental distractions;Slow rate;Small sips/bites Postural Changes and/or Swallow Maneuvers: Seated upright 90 degrees                Oral Care Recommendations: Oral care BID Follow up Recommendations: 24 hour supervision/assistance SLP Visit Diagnosis: Dysphagia,  oral phase (R13.11);Dysphagia, unspecified (R13.10) Plan: Continue with current plan of care       GO                Osie Bond., M.A. [place] Acute Rehabilitation Services Pager [phone] Office [phone]  05/29/2021, 9:08 AM

## 2021-05-29 NOTE — Progress Notes (Signed)
Nutrition Follow-up  DOCUMENTATION CODES:   Not applicable  INTERVENTION:   -Continue Nepro Shake po BID, each supplement provides 425 kcal and 19 grams protein  -Continue 30 ml Prosource Plus TID, each supplement provides 100 kcals and 15 grams protein -Continue Magic cup BID with meals, each supplement provides 290 kcal and 9 grams of protein  -Continue Renal MVI daily -Feeding assistance with meals  NUTRITION DIAGNOSIS:   Increased nutrient needs related to chronic illness (ESRD on HD) as evidenced by estimated needs.  Ongoing  GOAL:   Patient will meet greater than or equal to 90% of their needs  Progressing   MONITOR:   Diet advancement, PO intake, Supplement acceptance, Labs, Weight trends, Skin, I & O's  REASON FOR ASSESSMENT:   Consult Assessment of nutrition requirement/status, Poor PO  ASSESSMENT:   70 yo male with a PMH of ESRD on HD MWF, lung cancer, PAD, HTN, T2DM, and polysubstance abuse who presents with worsening gangrene of L foot and AMS.  8/8 - L transmetatarsal amputation 8/10- s/p BSE- advanced to dysphagia 2 diet with thin liquids 8/12- s/p fistulagram 8/15- s/p BSE- downgraded diet to dysphagia 3 diet with thin liquids  Reviewed I/O's: +480 ml x 24 hours and +2.3 L since admission  Case discussed with SLP; pt with prolonged mastication and would benefit from downgraded diet textures. He also requires feeding assistance. Noted intake has decreased since being advanced to a regular consistency diet (po 25%). Pt has been taking Prosource supplement and consumed about 25% of Nepro supplement.   Spoke with pt, who reports he is hungry. He likes the flavor of the Nepro shakes.   Per MD notes, pt will d/c to SNF once medically stable.   Medications reviewed and include phoslo, miralax, and senna.   Labs reviewed: Na: 133, K: 5.5, CBGS: 124-170 (inpatient orders for glycemic control are 0-9 units insulin aspart TID with meals).    NUTRITION -  FOCUSED PHYSICAL EXAM:  Flowsheet Row Most Recent Value  Orbital Region No depletion  Upper Arm Region No depletion  Thoracic and Lumbar Region No depletion  Buccal Region No depletion  Temple Region No depletion  Clavicle Bone Region No depletion  Clavicle and Acromion Bone Region No depletion  Scapular Bone Region No depletion  Dorsal Hand No depletion  Patellar Region No depletion  Anterior Thigh Region No depletion  Posterior Calf Region No depletion  Edema (RD Assessment) Mild  Hair Reviewed  Eyes Reviewed  Mouth Reviewed  Skin Reviewed  Nails Reviewed       Diet Order:   Diet Order             DIET DYS 3 Room service appropriate? No; Fluid consistency: Thin; Fluid restriction: 2000 mL Fluid  Diet effective now                   EDUCATION NEEDS:   No education needs have been identified at this time  Skin:  Skin Assessment: Skin Integrity Issues: Skin Integrity Issues:: Incisions Incisions: L metatarsal amputation, closed  Last BM:  05/28/21  Height:   Ht Readings from Last 1 Encounters:  05/22/21 5\' 6"  (1.676 m)    Weight:   Wt Readings from Last 1 Encounters:  05/29/21 58.9 kg   BMI:  Body mass index is 20.96 kg/m.  Estimated Nutritional Needs:   Kcal:  1800-2000  Protein:  90-105 grams  Fluid:  1000 ml + UOP    Loistine Chance, RD, LDN,  CDCES Registered Dietitian II Certified Diabetes Care and Education Specialist Please refer to [facility] for RD and/or RD on-call/weekend/after hours pager

## 2021-05-29 NOTE — Progress Notes (Signed)
Out Patient Arrangements:  Pt has been accepted @ [facility] on their MWF shift w/ a time of 11:50 am. The address is   [place], [place] [address] [phone] phone  Please advise of d/c date.  Linus Orn [facility] [phone]

## 2021-05-29 NOTE — Progress Notes (Signed)
FPTS Interim Progress Note  S: Checked on patient regarding nurse paging about 10-10 left foot pain accompanied with nausea.  Patient had taken Tylenol for pain but denied nausea medication.  Patient was asleep on entering the room.  Awoke to his name being called.  Patient did not endorse pain anywhere.  I examined the patient's lower extremities and he stated he felt my hand on his calf but that it was not painful.  Patient fell back asleep after physical exam.  O: BP (!) 166/74 (BP Location: Left Arm)   Pulse 92   Temp 98.2 F (36.8 C) (Oral)   Resp 16   Ht 5\' 6"  (1.676 m)   Wt 58.9 kg   SpO2 98%   BMI 20.96 kg/m   Cardiovascular: RRR, no murmurs auscultated.  Sinus rhythm on monitor Extremities: 2+ tibialis pulses bilaterally.  Nontender to palpation of the calves bilaterally.  A/P: I examined the patient for concerns of tenderness and pain.  DVT unlikely given patient tolerated physical exam well and pulses present.  Cellulitis unlikely at this time as patient mains have afebrile and is not tender to touch.  During this time period, patient's vital signs have remained stable.  Not concerned for any acute process at this time.  No changes to management required.  Patient is stable for dialysis this morning.  Wells Guiles, DO 05/29/2021, 5:28 AM PGY-1, [place] Medicine Service pager [phone]

## 2021-05-29 NOTE — Discharge Summary (Addendum)
[facility] Discharge Summary  Patient name: Roger Vazquez Medical record number: [id number] Date of birth: 08-17-1951 Age: 70 y.o. Gender: male Date of Admission: 05/18/2021  Date of Discharge: 06/01/21  Admitting Physician: Eppie Gibson, MD  Primary Care Provider: [facility] Consultants: Nephro, IR, VVS  Indication for Hospitalization: Sepsis secondary to worsening gangrene of the left foot  Discharge Diagnoses/Problem List:  Principal Problem:   Status post amputation of foot through metatarsal bone [facility]) Active Problems:   ESRD on hemodialysis ([place])   Mild protein-calorie malnutrition ([place])   Altered mental status  Disposition: SNF  Discharge Condition: S/p transmetatarsal amputation, stable  Discharge Exam:  Vitals:   06/01/21 0746 06/01/21 1120  BP: (!) 149/65 133/62  Pulse: 95 89  Resp: 17 17  Temp: 98.5 F (36.9 C) 98.7 F (37.1 C)  SpO2: 99% 99%   Taken from Dr. Saul Fordyce progress note on the day of discharge General: Eating breakfast, appears well, pleasant, cooperative, in NAD Cardiovascular: RRR, transmitted fistula sounds Respiratory: Lungs clear to auscultation, normal work of breathing on room air Abdomen: Soft, non-tender, non-distended. Bruising over right lower quadrant near injection site of heparin Extremities: Right upper extremity with residual edema  Brief Hospital Course:  Roger Vazquez is a 70 y.o. male with past medical history significant for ESRD on HD, history of lung cancer, PAD, hypertension, type 2 diabetes, polysubstance abuse who presented after being found down with concern for sepsis in the setting of altered mental status (AMS).   Sepsis   Gangrene of Left Foot Patient was brought in by EMS after being found down in his bathtub for unknown amount of time.  He was recently discharged for gangrene of his right second toe status post amputation.  He arrived tachycardic and hypertensive but  afebrile with a leukocytosis, WBC 18.9.  Chest x-ray negative, lactic acid unremarkable, CT head without acute intracranial abnormality.  MRI of the foot showed diffuse muscular edema with subcutaneous edema and potentially cellulitis.  Patient received cefepime, vancomycin, and Flagyl.  Vascular surgery was consulted and noted high risk for major amputation requiring transmetatarsal amputation.  On 8/8, vascular surgery performed transmetatarsal amputation.  Dementia  Delirium He had multiple episodes of disorientation/agitation on the floor that required PRN ativan and a 1-to-1 sitter in addition to delirium precautions. These were thought to be indicative of some "ICU delirium," though we also considered alcohol withdrawal given his history of alcohol abuse. These resolved by hospital day 4 and his mental status remained at baseline for the remainder of the hospitalization.   ESRD on HD Patient notably had missed HD session on Wednesday prior to arrival.  His dialysis regimen is MWF.  Nephrology scheduled dialysis throughout the admission. His last day of dialysis was 8/17.   Right Upper Extremity Edema  Patient noticed to have acute, painful swelling of RUE at dialysis session 8/11. 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 He had some residual edema but no further pain after the procedure.   DM2 His home regimen was of Lantus 15u and sliding scale insulin. Due to his poor PO intake while in hospital, we held his long acting insulin. His glucose remained well-controlled for the remainder of the hospitalization on this sliding scale regimen.   PCP follow-up recommendations Pt has outpatient follow-up with Vascular Surgery on 8/23. Monitor for signs of worsening gangrene as he may require a transtibial  amputation.  He was evaluated by our SLP who had some concern that he was at risk for aspiration. They  have recommended a dysphagia 3 diet. Recommend outpatient education on safe food preparation options. There was some concern on admission that he may have been drinking heavily between his last hospitalization and this one. Please screen him for alcohol abuse.    Significant Procedures:  8/8- LLE Transmetatarsal amputation with VVS 8/12- Shuntogram with IR, angioplasty of R brachiocephalic vein  Significant Labs and Imaging:  Recent Labs  Lab 05/27/21 0407 05/28/21 0219 05/30/21 1629  WBC 9.7 10.8* 12.5*  HGB 9.2* 8.8* 10.9*  HCT 27.9* 25.9* 33.3*  PLT 339 321 354   Recent Labs  Lab 05/26/21 0016 05/27/21 0407 05/28/21 0219 05/29/21 0149 05/31/21 0242  NA 131* 133* 133* 133* 134*  K 4.3 4.5 4.9 5.5* 4.9  CL 93* 92* 93* 95* 95*  CO2 27 27 28 28 23   GLUCOSE 156* 150* 157* 160* 146*  BUN 32* 47* 59* 76* 66*  CREATININE 5.11* 6.90* 8.38* 9.77* 8.38*  CALCIUM 8.8* 9.3 9.2 9.2 9.6  MG  --  2.6*  --  2.8*  --   PHOS 2.4* 3.6 4.3 5.0* 5.3*  ALBUMIN 2.5* 2.5* 2.3* 2.2* 3.1*    CT Head 8/5 IMPRESSION: Chronic microvascular ischemia and generalized atrophy without acute intracranial abnormality.  MR Foot L 8/5 IMPRESSION: 1. Along the irregular distal margin of the second metatarsal metaphysis there is low-grade marrow edema along with a small (0.1 cubic cm) fluid collection, as well as overlying soft tissue edema tracking along the flap and the dorsal soft tissue defect. Given the irregularity of the distal metatarsal, infectious process along the metatarsal is difficult to exclude. 2. Low-grade marrow edema along the proximal interphalangeal joint of the great toe and in the head of the third metatarsal is not entirely specific and could be related to arthropathy, with early osteomyelitis a less likely differential diagnostic consideration. 3. Diffuse muscular edema, probably neurogenic, myositis less likely. Subcutaneous edema in the foot is observed and cellulitis  is not excluded.   IR Shuntogram 8/12 IMPRESSION: 1. 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 There is a second approximately 50% stenosis at the level of venous anastomosis of the graft. 2. Successful recanalization of the occluded right brachiocephalic vein with angioplasty up to 14 mm. There is marked improved luminal gain with near complete resolution of the neck and chest wall collaterals. There remains mild residual stenosis. If this lesion is found to recur in a short interval, consideration could be given towards stenting. 3. Successful angioplasty of a focal stenosis at the venous anastomosis of the AV graft up to 7 mm with improved appearance and fast flow through the AV graft at the end of the procedure.   Results/Tests Pending at Time of Discharge: None  Discharge Medications:  Allergies as of 06/01/2021       Reactions   Sildenafil Other (See Comments)   Pt stated, "Makes my Blood Pressure drop"        Medication List     STOP taking these medications    hydrALAZINE 25 MG tablet Commonly known as: APRESOLINE   insulin aspart 100 UNIT/ML injection Commonly known as: NovoLOG   insulin glargine 100 UNIT/ML injection Commonly known as: Lantus   oxyCODONE-acetaminophen 5-325 MG tablet Commonly known as: PERCOCET/ROXICET       TAKE these medications    acetaminophen  500 MG tablet Commonly known as: TYLENOL Take 1 tablet (500 mg total) by mouth every 6 (six) hours. What changed:  how much to take when to take this reasons to take this   aspirin 81 MG tablet Take 81 mg by mouth daily.   calcium acetate 667 MG capsule Commonly known as: PHOSLO Take 667 mg by mouth 3 (three) times daily with meals.   calcium carbonate 1250 (500 Ca) MG chewable tablet Commonly known as: OS-CAL Chew 1 tablet by mouth daily as needed for heartburn.   clopidogrel 75 MG tablet Commonly known as:  PLAVIX Take 1 tablet (75 mg total) by mouth daily.   lidocaine-prilocaine cream Commonly known as: EMLA Apply 1 application topically as needed.   multivitamin with minerals Tabs tablet Take 1 tablet by mouth daily.   polyethylene glycol 17 g packet Commonly known as: MIRALAX / GLYCOLAX Take 17 g by mouth 2 (two) times daily.   senna 8.6 MG Tabs tablet Commonly known as: SENOKOT Take 1 tablet (8.6 mg total) by mouth daily.               Discharge Care Instructions  (From admission, onward)           Start     Ordered   06/01/21 0000  Discharge wound care:       Comments: Monitor surgical wounds closely. Recommend follow up with wound care.   06/01/21 1451             Discharge Instructions: Please refer to Patient Instructions section of EMR for full details.  Patient was counseled important signs and symptoms that should prompt return to medical care, changes in medications, dietary instructions, activity restrictions, and follow up appointments.   Follow-Up Appointments:  Orvis Brill, DO [facility] Teaching Service, PGY-1    Lyndee Hensen, DO PGY-3, [facility] 06/01/2021

## 2021-05-29 NOTE — Procedures (Signed)
Patient seen and examined on Hemodialysis. BP (!) 148/72 (BP Location: Left Arm)   Pulse 89   Temp 97.8 F (36.6 C) (Oral)   Resp 12   Ht 5\' 6"  (1.676 m)   Wt 58.9 kg   SpO2 99%   BMI 20.96 kg/m   QB 400 mL/ min via AVG, UF goal 2.5L  Tolerating treatment without complaints at this time.   Madelon Lips MD [facility] Pgr [phone] 1:12 PM

## 2021-05-30 ENCOUNTER — Other Ambulatory Visit: Payer: Self-pay

## 2021-05-30 ENCOUNTER — Encounter (HOSPITAL_COMMUNITY): Payer: Medicare HMO

## 2021-05-30 LAB — CBC
HCT: 33.3 % — ABNORMAL LOW (ref 39.0–52.0)
Hemoglobin: 10.9 g/dL — ABNORMAL LOW (ref 13.0–17.0)
MCH: 33.7 pg (ref 26.0–34.0)
MCHC: 32.7 g/dL (ref 30.0–36.0)
MCV: 103.1 fL — ABNORMAL HIGH (ref 80.0–100.0)
Platelets: 354 10*3/uL (ref 150–400)
RBC: 3.23 MIL/uL — ABNORMAL LOW (ref 4.22–5.81)
RDW: 15.6 % — ABNORMAL HIGH (ref 11.5–15.5)
WBC: 12.5 10*3/uL — ABNORMAL HIGH (ref 4.0–10.5)
nRBC: 0 % (ref 0.0–0.2)

## 2021-05-30 LAB — GLUCOSE, CAPILLARY
Glucose-Capillary: 123 mg/dL — ABNORMAL HIGH (ref 70–99)
Glucose-Capillary: 152 mg/dL — ABNORMAL HIGH (ref 70–99)
Glucose-Capillary: 155 mg/dL — ABNORMAL HIGH (ref 70–99)
Glucose-Capillary: 107 mg/dL — ABNORMAL HIGH (ref 70–99)

## 2021-05-30 LAB — SARS CORONAVIRUS 2 (TAT 6-24 HRS): SARS Coronavirus 2: NEGATIVE

## 2021-05-30 MED ORDER — HEPARIN SODIUM (PORCINE) 5000 UNIT/ML IJ SOLN
5000.0000 [IU] | Freq: Two times a day (BID) | INTRAMUSCULAR | Status: DC
  Administered 2021-05-30: 5000 [IU] via SUBCUTANEOUS
  Filled 2021-05-30: qty 1

## 2021-05-30 NOTE — Progress Notes (Addendum)
Family Medicine Teaching Service Daily Progress Note Intern Pager: [phone]  Patient name: Roger Vazquez Medical record number: [id number] Date of birth: 01/16/1951 Age: 70 y.o. Gender: male  Primary Care Provider: [facility] Consultants: Nephro, IR, VVS Code Status: Full  Pt Overview and Major Events to Date:  8/5- admitted 8/6- recurrent hypoglycemia 8/8- LLE transmetatarsal amputation 8/12- shuntogram with IR, angioplasty of R brachiocephalic vein  Assessment and Plan: [name] is a 70yr old male who presented with worsening gangrene of his L foot after a recent hospitalization for amputation of his L second toe. He is now post-op day seven from a transmetatarsal amputation. Hx significant for ESRD on HD MWF, hx of lung CA, PAD, HTN, DM2, and poly substance abuse. Patient is medically stable and awaiting SNF.  Gangrene of L foot, s/p transmetatarsal amputation POD 8. Pain well-controlled. Patient pain is well controlled. Denies pain this morning. Pt has a bed at [facility]	[place]. Await insurance authorization.  - Tylenol 500mg  q6h - PT/OT - Awaiting SNF insurance authorization   ESRD on HD Hyperkalemia Dialysis in hospital yesterday.  - Nephrology following, appreciate recommendations  - RFP on dialysis days : AM  - Renal diet  RUE swelling s/p angioplasty Improved. No pain. Still residual swelling noted. - Keep elevated  HTN, chronic, stable - Continue hydralazine 50mg  TID  DM2, chronic, stable  - Continue sSSI  FEN/GI: Regular diet  PPx: Heparin Dispo:SNF tomorrow. Barriers insurance authorization.   Subjective:  Pt with foot pain overnight that resolved with Tylenol.  Objective: Temp:  [97.7 F (36.5 C)-99 F (37.2 C)] 97.7 F (36.5 C) (08/16 1123) Pulse Rate:  [51-104] 99 (08/16 1123) Resp:  [15-21] 18 (08/16 1123) BP: (139-156)/(65-83) 143/65 (08/16 1123) SpO2:  [78 %-100 %] 99 % (08/16 1123) Weight:  [57.8 kg] 57.8 kg (08/16  0433)   General: Patient sleeping, arouses easily to voice, NAD Cardiovascular: Regular rate, regular rhythm, transmitted fistula sounds Respiratory: Lung fields clear to auscultation, normal work of breathing on room air Abdomen: Soft, nontender, nondistended Extremities: Right upper extremity with residual edema, arm was not elevated during interview.  Left lower extremity with dressing in place, no erythema, streaking, tenderness extending proximally  Laboratory: Recent Labs  Lab 05/26/21 0016 05/27/21 0407 05/28/21 0219  WBC 11.0* 9.7 10.8*  HGB 8.9* 9.2* 8.8*  HCT 27.1* 27.9* 25.9*  PLT 314 339 321    Recent Labs  Lab 05/27/21 0407 05/28/21 0219 05/29/21 0149  NA 133* 133* 133*  K 4.5 4.9 5.5*  CL 92* 93* 95*  CO2 27 28 28   BUN 47* 59* 76*  CREATININE 6.90* 8.38* 9.77*  CALCIUM 9.3 9.2 9.2  GLUCOSE 150* 157* 160*     Imaging/Diagnostic Tests: No results found.   Orvis Brill, DO [facility] Service, PGY-1  [place] Intern Pager [phone]

## 2021-05-30 NOTE — Progress Notes (Signed)
Family medicine contacted regarding some bleeding from abdominal area, no new orders. Will continue to monitor.

## 2021-05-30 NOTE — Progress Notes (Addendum)
Occupational Therapy Treatment Patient Details Name: Roger Vazquez MRN: [id number] DOB: [birth year] Today's Date: 05/30/2021    History of present illness Pt is a 70 y.o. male admitted 05/18/21 with AMS, worsening L foot gangrene. S/p L TMA on 8/8. Course complicated by delirium, CIWA. 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  PMH includes ESRD (HD MWF), lung CA, PAD (s/p L toe amputation), HTN, DM.   OT comments  Pt progressing slowly towards acute OT goals. Focus of session was bed mobility and simulated toilet transfer. Today, pt needed mod A +2 to stand, max A+2 to pivot. Pt unable to take advance either foot in standing, assist to advance hips into recliner. PT c/o lightheadedness and nausea, VSS, nursing notified. Recommend staff utilize [place] for toilet transfers and transfers in/out of recliner.    Follow Up Recommendations  SNF    Equipment Recommendations  Other (comment) (defer to next venue)    Recommendations for Other Services      Precautions / Restrictions Precautions Precautions: Fall Precaution Comments: mod fall Restrictions Weight Bearing Restrictions: Yes LLE Weight Bearing: Weight bearing as tolerated Other Position/Activity Restrictions: WBAT through heel in [place]       Mobility Bed Mobility Overal bed mobility: Needs Assistance Bed Mobility: Rolling;Sidelying to Sit Rolling: Min assist;Mod assist Sidelying to sit: Mod assist;HOB elevated       General bed mobility comments: pt requires cues for technique and physical assistance to initiate    Transfers Overall transfer level: Needs assistance Equipment used: 2 person hand held assist Transfers: Sit to/from [facility] Sit to Stand: Mod assist;+2 physical assistance Stand pivot transfers: Max assist;+2 physical assistance       General transfer  comment: pt unable to step at this time, PT/OT facilitate pivot transfer with significant physical assistance    Balance Overall balance assessment: Needs assistance Sitting-balance support: Single extremity supported;Feet supported Sitting balance-Leahy Scale: Poor Sitting balance - Comments: pt with UE support of bed at all times when sitting   Standing balance support: Bilateral upper extremity supported Standing balance-Leahy Scale: Poor Standing balance comment: modA x2                           ADL either performed or assessed with clinical judgement   ADL Overall ADL's : Needs assistance/impaired                         Toilet Transfer: Maximal assistance;+2 for physical assistance;Stand-pivot;Squat-pivot Toilet Transfer Details (indicate cue type and reason): EOB to recliner. Unable to take a step at this time. Assist to pivot hips into recliner.           General ADL Comments: Recommend staff continue to use [place], spoke with RN.     Vision       Perception     Praxis      Cognition Arousal/Alertness: Awake/alert Behavior During Therapy: Flat affect Overall Cognitive Status: No family/caregiver present to determine baseline cognitive functioning Area of Impairment: Following commands;Safety/judgement;Awareness;Problem solving                       Following Commands: Follows one step commands with increased time Safety/Judgement: Decreased awareness of safety;Decreased awareness of deficits Awareness: Intellectual Problem Solving: Slow processing;Decreased initiation General Comments: pt  requires tactile and verbal cues to initiate most mobility this session. increased time to respond to questions. Pt is alert and oriented x4        Exercises     Shoulder Instructions       General Comments VSS on RA, pt does report lightheadedness and nausea when sitting at edge of bed although BP stable at 134/65 (85). RN made aware     Pertinent Vitals/ Pain       Pain Assessment: Faces Faces Pain Scale: Hurts even more Pain Location: pt does not indicate Pain Descriptors / Indicators: Grimacing Pain Intervention(s): Monitored during session  Home Living                                          Prior Functioning/Environment              Frequency  Min 2X/week        Progress Toward Goals  OT Goals(current goals can now be found in the care plan section)  Progress towards OT goals: Progressing toward goals (slowly)  Acute Rehab OT Goals Patient Stated Goal: To get out of hospital OT Goal Formulation: Patient unable to participate in goal setting Time For Goal Achievement: 06/13/21 Potential to Achieve Goals: Fair ADL Goals Pt Will Perform Grooming: with modified independence;sitting Pt Will Perform Lower Body Bathing: with min assist;sit to/from stand;sitting/lateral leans Pt Will Transfer to Toilet: with modified independence;ambulating;bedside commode Pt Will Perform Toileting - Clothing Manipulation and hygiene: with modified independence;sitting/lateral leans;sit to/from stand Pt Will Perform Tub/Shower Transfer: Tub transfer;Shower transfer;with min assist;ambulating;shower seat;tub bench;grab bars;rolling walker  Plan Discharge plan remains appropriate;Frequency remains appropriate    Co-evaluation    PT/OT/SLP Co-Evaluation/Treatment: Yes Reason for Co-Treatment: For patient/therapist safety;To address functional/ADL transfers PT goals addressed during session: Mobility/safety with mobility;Balance;Strengthening/ROM OT goals addressed during session: ADL's and self-care      AM-PAC OT "6 Clicks" Daily Activity     Outcome Measure   Help from another person eating meals?: A Little Help from another person taking care of personal grooming?: A Little Help from another person toileting, which includes using toliet, bedpan, or urinal?: Total Help from another person  bathing (including washing, rinsing, drying)?: A Lot Help from another person to put on and taking off regular upper body clothing?: A Little Help from another person to put on and taking off regular lower body clothing?: A Lot 6 Click Score: 14    End of Session    OT Visit Diagnosis: Unsteadiness on feet (R26.81);Other abnormalities of gait and mobility (R26.89);Muscle weakness (generalized) (M62.81);Other symptoms and signs involving cognitive function   Activity Tolerance Patient limited by fatigue   Patient Left in chair;with call bell/phone within reach   Nurse Communication Mobility status;Other (comment);Need for lift equipment (lightheaded and nausea reportes)        Time: 0539-7673 OT Time Calculation (min): 27 min  Charges: OT General Charges $OT Visit: 1 Visit OT Treatments $Self Care/Home Management : 8-22 mins  Tyrone Schimke, OT Acute Rehabilitation Services Pager: [phone] Office: [phone]    Hortencia Pilar 05/30/2021, 12:10 PM

## 2021-05-30 NOTE — Progress Notes (Signed)
FPTS Interim Progress Note  S: Received page from nurse that patient endorsing 10 out of 10 for pain.  Patient was given 500 mg of Tylenol.  On presentation, patient does not appear in acute distress and frequently changed where his pain was located upon being asked several times.  Patient stated it was his great toe on his right foot a couple times, his right foot couple times, and his right calf once.  Patient is unable to distinguish what the pain felt like.  O: BP (!) 156/68 (BP Location: Left Arm)   Pulse 99   Temp 97.8 F (36.6 C) (Oral)   Resp 19   Ht 5\' 6"  (1.676 m)   Wt 57.6 kg   SpO2 98%   BMI 20.50 kg/m    General: Well-appearing, calm, no acute distress CV: RRR, no murmurs auscultated Neuro: Sensation intact on lower extremities bilaterally Extremities: 2+ tibialis pulses bilaterally, nontender on palpation of calves bilaterally and feet bilaterally, no masses or abnormalities appreciated bilaterally Skin: No rashes or lesions noted, extremities warm and dry  A/P: Patient is overall nontender unreactive on physical exam.  Sensation and pulses normal.  Patient remains afebrile with an unimpressive demeanor that is not matching of 10 out of 10 pain.  Patient has also just received Tylenol dose which has not had effective time to work. Given nature of negative physical exam and patient's demeanor during rounding, I am not overly concerned of an acute process at this time.  I discussed with nurse to alert me should patient still endorse pain within 1.5 hours.  Wells Guiles, DO 05/30/2021, 4:19 AM PGY-1, [place] [facility] Service pager [phone]

## 2021-05-30 NOTE — Progress Notes (Signed)
[facility] Progress Note   Subjective:    Seen in room. Completed dialysis yesterday net UF 1.8L. No complaints this am.    Objective Vitals:   05/30/21 0012 05/30/21 0318 05/30/21 0433 05/30/21 0730  BP: 139/66 (!) 156/68  (!) 154/73  Pulse: 95 99  (!) 104  Resp: 16 19  18   Temp: 98.4 F (36.9 C) 97.8 F (36.6 C)  98.8 F (37.1 C)  TempSrc: Oral Oral  Oral  SpO2: 98% 98%  98%  Weight:   57.8 kg   Height:       Physical Exam General: Frail appearing elderly man, nad  Heart: S1 and S2; 2/6 murmur Lungs: Clear throughout; No wheezing, rales, or rhonchi Abdomen: Soft, non-tender Extremities:No  sig LE edema; s/p L TMA  Dialysis Access: RUE AVG+bruit; RA swelling noted   Filed Weights   05/29/21 0435 05/29/21 1530 05/30/21 0433  Weight: 58.9 kg 57.6 kg 57.8 kg    Intake/Output Summary (Last 24 hours) at 05/30/2021 1112 Last data filed at 05/29/2021 1600 Gross per 24 hour  Intake 50 ml  Output 1800 ml  Net -1750 ml     Additional Objective Labs: Basic Metabolic Panel: Recent Labs  Lab 05/27/21 0407 05/28/21 0219 05/29/21 0149  NA 133* 133* 133*  K 4.5 4.9 5.5*  CL 92* 93* 95*  CO2 27 28 28   GLUCOSE 150* 157* 160*  BUN 47* 59* 76*  CREATININE 6.90* 8.38* 9.77*  CALCIUM 9.3 9.2 9.2  PHOS 3.6 4.3 5.0*    Liver Function Tests: Recent Labs  Lab 05/27/21 0407 05/28/21 0219 05/29/21 0149  ALBUMIN 2.5* 2.3* 2.2*    No results for input(s): LIPASE, AMYLASE in the last 168 hours. CBC: Recent Labs  Lab 05/24/21 0222 05/25/21 0804 05/26/21 0016 05/27/21 0407 05/28/21 0219  WBC 14.0* 12.1* 11.0* 9.7 10.8*  HGB 9.9* 9.4* 8.9* 9.2* 8.8*  HCT 30.3* 27.8* 27.1* 27.9* 25.9*  MCV 104.8* 101.8* 103.0* 101.8* 100.8*  PLT 313 314 314 339 321    Blood Culture    Component Value Date/Time   SDES BLOOD LEFT FOREARM 05/18/2021 1955   SDES BLOOD LEFT HAND 05/18/2021 1955   SPECREQUEST  05/18/2021 1955    BOTTLES DRAWN AEROBIC AND ANAEROBIC  Blood Culture adequate volume   SPECREQUEST  05/18/2021 1955    BOTTLES DRAWN AEROBIC AND ANAEROBIC Blood Culture adequate volume   CULT  05/18/2021 1955    NO GROWTH 5 DAYS Performed at [facility], [place] [address] [place], [place] [address]    CULT  05/18/2021 1955    NO GROWTH 5 DAYS Performed at [facility] [address], [place], [place] [address]    REPTSTATUS 05/24/2021 FINAL 05/18/2021 1955   REPTSTATUS 05/24/2021 FINAL 05/18/2021 1955    Cardiac Enzymes: No results for input(s): CKTOTAL, CKMB, CKMBINDEX, TROPONINI in the last 168 hours. CBG: Recent Labs  Lab 05/29/21 0744 05/29/21 1154 05/29/21 1555 05/29/21 2054 05/30/21 0736  GLUCAP 170* 126* 137* 89 123*    Iron Studies: No results for input(s): IRON, TIBC, TRANSFERRIN, FERRITIN in the last 72 hours. Lab Results  Component Value Date   INR 1.1 05/18/2021   INR 1.0 04/27/2021   INR 1.08 07/31/2018   Studies/Results: No results found.  Medications:  sodium chloride Stopped (05/22/21 2314)    (feeding supplement) PROSource Plus  30 mL Oral TID BM   acetaminophen  500 mg Oral Q6H   aspirin  81 mg Oral  Daily   calcium acetate  667 mg Oral TID WC   Chlorhexidine Gluconate Cloth  6 each Topical Q0600   clopidogrel  75 mg Oral Daily   darbepoetin (ARANESP) injection - DIALYSIS  60 mcg Intravenous Q Mon-HD   doxercalciferol  1 mcg Intravenous Q M,W,F-HD   feeding supplement (NEPRO CARB STEADY)  237 mL Oral BID BM   heparin  5,000 Units Subcutaneous Q8H   hydrALAZINE  50 mg Oral Q8H   insulin aspart  0-9 Units Subcutaneous TID WC   multivitamin  1 tablet Oral QHS   polyethylene glycol  17 g Oral Daily   senna  1 tablet Oral Daily    Dialysis Orders: [facility] MWF 3hr, 400/Autoflow 1.5, 60.kg, 2K/3Ca, AVG,  --Heparin 2,000 units IV TIW - Hectoral 31mcg IV q HD - Mircera 28mcg IV q 2 weeks - last 7/13 -Venofer 100 q. dialysis last dose to be given 8/10.   Assessment/Plan: 1. 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 Per primary/VVS.  2. AMS: Multifactorial. He has baseline dementia, H/O polysubstance abuse, sepsis from foot wound. Per primary  3. ESRD -MWF. Continue on schedule. Next HD 8/17.  4. Anemia - Hgb 8.8  Aranesp 60 q week starting 8/15.  5. Secondary hyperparathyroidism -Ca/Phos ok. Continue hectorol 1 mcg IV TIW. 6. HTN/volume - BP borderline. UF as tolerated-goal 2-3L today  7. Nutrition - Renal Diet. Low albumin. Add protein supps. 8. DMT2-No diabetic meds on OP med list. Per primary. 9. RUE swelling : Improving- s/p fistulogram with successful PTA per IR on 8/12  10. Dispo- Awaiting SNF placement   Glorie Dowlen Larina Earthly PA-C [facility] 05/30/2021,11:12 AM

## 2021-05-30 NOTE — TOC Progression Note (Addendum)
Transition of Care [facility]) - Progression Note    Patient Details  Name: TOIVO BORDON MRN: [id number] Date of Birth: [birth year]  [facility] of [facility]) CM/SW [place], [place] Phone Number: 05/30/2021, 11:45 AM  Clinical Narrative:     CSW spoke with Narda Rutherford with [facility] who confirmed she will start insurance authorization for patient. Patient has SNF bed at [facility]. Insurance authorization is pending.CSW will continue to follow and assist with dc planning needs.  Expected Discharge Plan: [place] Barriers to Discharge: Continued Medical Work up  Expected Discharge Plan and Services Expected Discharge Plan: [place] In-house Referral: Clinical Social Work     Living arrangements for the past 2 months: Single Family Home                                       Social Determinants of Health (SDOH) Interventions    Readmission Risk Interventions No flowsheet data found.

## 2021-05-30 NOTE — Progress Notes (Signed)
Pt reports of 10/10 foot pain. Scheduled Tylenol was given and pt has found no relief. Paged MD on call.

## 2021-05-30 NOTE — Progress Notes (Signed)
FPTS Interim Progress Note  S: Patient comfortably sleeping in bed.  Received page earlier in the night from nurse regarding foot pain.  Patient has continued to receive scheduled Tylenol every 6 hours.  O: BP (P) 137/67 (BP Location: Left Arm)   Pulse (P) 97   Temp 98 F (36.7 C) (Oral)   Resp 17   Ht 5\' 6"  (1.676 m)   Wt 57.8 kg   SpO2 (P) 100%   BMI 20.57 kg/m     A/P: Patient overall remains comfortable in room.  Not concerned at this time and do not believe any changes in management necessary.  Wells Guiles, DO 05/30/2021, 9:57 PM PGY-1, [place] Medicine Service pager [phone]

## 2021-05-30 NOTE — Plan of Care (Signed)

## 2021-05-30 NOTE — Progress Notes (Signed)
Physical Therapy Treatment Patient Details Name: Roger Vazquez MRN: [id number] DOB: [birth year] Today's Date: 05/30/2021    History of Present Illness Pt is a 70 y.o. male admitted 05/18/21 with AMS, worsening L foot gangrene. S/p L TMA on 8/8. Course complicated by delirium, CIWA. 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  PMH includes ESRD (HD MWF), lung CA, PAD (s/p L toe amputation), HTN, DM.    PT Comments    Pt limited by reports of nausea and lightheadedness during session. Pt requires tactile cueing to initiate all functional mobility during session and continues to require significant physical assistance to stand. Pt is unable to safely initiate gait training at this time due to generalized weakness. Pt will benefit from continued aggressive mobilization to improve functional mobility quality and reduce falls risk. PT continues to recommend SNF placement at this time.   Follow Up Recommendations  SNF     Equipment Recommendations  Wheelchair (measurements PT);Wheelchair cushion (measurements PT)    Recommendations for Other Services       Precautions / Restrictions Precautions Precautions: Fall Precaution Comments: mod fall Restrictions Weight Bearing Restrictions: Yes LLE Weight Bearing: Weight bearing as tolerated Other Position/Activity Restrictions: WBAT through heel in [place]    Mobility  Bed Mobility Overal bed mobility: Needs Assistance Bed Mobility: Rolling;Sidelying to Sit Rolling: Min assist;Mod assist Sidelying to sit: Mod assist;HOB elevated       General bed mobility comments: pt requires cues for technique and physical assistance to initiate    Transfers Overall transfer level: Needs assistance Equipment used: 2 person hand held assist Transfers: Sit to/from [facility] Sit to Stand: Mod assist;+2  physical assistance Stand pivot transfers: Max assist;+2 physical assistance       General transfer comment: pt unable to step at this time, PT/OT facilitate pivot transfer with significant physical assistance  Ambulation/Gait                 Stairs             Wheelchair Mobility    Modified Rankin (Stroke Patients Only)       Balance Overall balance assessment: Needs assistance Sitting-balance support: Single extremity supported;Feet supported Sitting balance-Leahy Scale: Poor Sitting balance - Comments: pt with UE support of bed at all times when sitting   Standing balance support: Bilateral upper extremity supported Standing balance-Leahy Scale: Poor Standing balance comment: modA x2                            Cognition Arousal/Alertness: Awake/alert Behavior During Therapy: Flat affect Overall Cognitive Status: No family/caregiver present to determine baseline cognitive functioning Area of Impairment: Following commands;Safety/judgement;Awareness;Problem solving                       Following Commands: Follows one step commands with increased time Safety/Judgement: Decreased awareness of safety;Decreased awareness of deficits Awareness: Intellectual Problem Solving: Slow processing;Decreased initiation General Comments: pt requires tactile and verbal cues to initiate most mobility this session. increased time to respond to any PT questions. Pt is alert and oriented x4      Exercises      General Comments General comments (skin integrity, edema, etc.): VSS on RA, pt does report lightheadedness and nausea when sitting at edge of bed although BP stable at  134/65 (85). RN made aware      Pertinent Vitals/Pain Pain Assessment: Faces Faces Pain Scale: Hurts even more Pain Location: pt does not indicate Pain Descriptors / Indicators: Grimacing Pain Intervention(s): Monitored during session    Home Living                       Prior Function            PT Goals (current goals can now be found in the care plan section) Acute Rehab PT Goals Patient Stated Goal: To get out of hospital Progress towards PT goals: Progressing toward goals (very slowly)    Frequency    Min 2X/week      PT Plan Current plan remains appropriate;Frequency needs to be updated    Co-evaluation PT/OT/SLP Co-Evaluation/Treatment: Yes Reason for Co-Treatment: For patient/therapist safety;To address functional/ADL transfers PT goals addressed during session: Mobility/safety with mobility;Balance;Strengthening/ROM        AM-PAC PT "6 Clicks" Mobility   Outcome Measure  Help needed turning from your back to your side while in a flat bed without using bedrails?: A Lot Help needed moving from lying on your back to sitting on the side of a flat bed without using bedrails?: A Lot Help needed moving to and from a bed to a chair (including a wheelchair)?: Total Help needed standing up from a chair using your arms (e.g., wheelchair or bedside chair)?: Total Help needed to walk in hospital room?: Total Help needed climbing 3-5 steps with a railing? : Total 6 Click Score: 8    End of Session   Activity Tolerance: Treatment limited secondary to medical complications (Comment) (nausea) Patient left: in chair;with call bell/phone within reach;with chair alarm set Nurse Communication: Mobility status;Need for lift equipment (STEDY or +2 transfers) PT Visit Diagnosis: Other abnormalities of gait and mobility (R26.89);Difficulty in walking, not elsewhere classified (R26.2)     Time: 0569-7948 PT Time Calculation (min) (ACUTE ONLY): 24 min  Charges:  $Therapeutic Activity: 8-22 mins                     Zenaida Niece, PT, DPT Acute Rehabilitation Pager: [phone]    Zenaida Niece 05/30/2021, 11:27 AM

## 2021-05-30 NOTE — Progress Notes (Signed)
FPTS Interim Progress Note - Late Entry   S: Received page from RN regarding potential bleeding from abdomen. Pt denies abdominal pain.   O: BP (!) 159/72 (BP Location: Left Arm)   Pulse (!) 108   Temp 97.7 F (36.5 C) (Oral)   Resp 17   Ht 5\' 6"  (1.676 m)   Wt 57.8 kg   SpO2 98%   BMI 20.57 kg/m    GEN: resting in bed watching TV  RESP: no increased work of breathing  ABD: soft, non-tender  SKIN: warm, dry (see images below)        A/P:  Superficial Abdominal Bleeding  VTE ppx  Decrease frequency of SQ Heparin to Q12H  Obtain CBC    Lyndee Hensen, DO 05/30/2021, 3:50 PM PGY-3, [place] Medicine Service pager [phone]

## 2021-05-30 NOTE — Progress Notes (Signed)
FPTS Interim Night Progress Note  S:Patient sleeping comfortably.  Rounded with primary night RN.  No concerns voiced.  No orders required.    O: Today's Vitals   05/30/21 0012 05/30/21 0318 05/30/21 0433 05/30/21 0730  BP: 139/66 (!) 156/68  (!) 154/73  Pulse: 95 99  (!) 104  Resp: 16 19  18   Temp: 98.4 F (36.9 C) 97.8 F (36.6 C)  98.8 F (37.1 C)  TempSrc: Oral Oral  Oral  SpO2: 98% 98%  98%  Weight:   57.8 kg   Height:      PainSc:  10-Worst pain ever        A/P: Continue current management  Carollee Leitz MD PGY-3, [place] Medicine Service pager [phone]

## 2021-05-31 DIAGNOSIS — Z992 Dependence on renal dialysis: Secondary | ICD-10-CM

## 2021-05-31 LAB — GLUCOSE, CAPILLARY
Glucose-Capillary: 180 mg/dL — ABNORMAL HIGH (ref 70–99)
Glucose-Capillary: 185 mg/dL — ABNORMAL HIGH (ref 70–99)
Glucose-Capillary: 144 mg/dL — ABNORMAL HIGH (ref 70–99)
Glucose-Capillary: 131 mg/dL — ABNORMAL HIGH (ref 70–99)

## 2021-05-31 LAB — RENAL FUNCTION PANEL
Albumin: 3.1 g/dL — ABNORMAL LOW (ref 3.5–5.0)
Anion gap: 16 — ABNORMAL HIGH (ref 5–15)
BUN: 66 mg/dL — ABNORMAL HIGH (ref 8–23)
CO2: 23 mmol/L (ref 22–32)
Calcium: 9.6 mg/dL (ref 8.9–10.3)
Chloride: 95 mmol/L — ABNORMAL LOW (ref 98–111)
Creatinine, Ser: 8.38 mg/dL — ABNORMAL HIGH (ref 0.61–1.24)
GFR, Estimated: 6 mL/min — ABNORMAL LOW (ref >60)
Glucose, Bld: 146 mg/dL — ABNORMAL HIGH (ref 70–99)
Phosphorus: 5.3 mg/dL — ABNORMAL HIGH (ref 2.5–4.6)
Potassium: 4.9 mmol/L (ref 3.5–5.1)
Sodium: 134 mmol/L — ABNORMAL LOW (ref 135–145)

## 2021-05-31 MED ORDER — DOXERCALCIFEROL 4 MCG/2ML IV SOLN
INTRAVENOUS | Status: AC
  Filled 2021-05-31: qty 2

## 2021-05-31 NOTE — Progress Notes (Addendum)
FPTS Interim Progress Note  S: Paged by nurse the patient is endorsing bilateral flank pain and is requesting Tylenol.  His last Tylenol was received at 2115.  Patient stated there was pain when his right sock was removed and placed back on.  O: BP 128/63 (BP Location: Left Arm)   Pulse 100   Temp 98.3 F (36.8 C) (Oral)   Resp 15   Ht 5\' 6"  (1.676 m)   Wt 56.9 kg   SpO2 100%   BMI 20.25 kg/m   Gen: awake, alert, comfortable in bed Extremities: 2+ tibial pulses bilaterally, no tenderness on palpation of calf, no pitting edema Derm: dry and cracked skin on right foot with active tinea pedis and poor state of toenails  A/P: Patient endorsed stabbing pain on feet bilaterally but without notable reaction when palpated.  Patient continues to have normal vascular and neuro exam of lower extremities.  At this time my concern the patient has had active tinea infection combination with unkempt dry skin that may be regularly irritating him.  Consider clotrimazole topically with moisturizer application multiple times in the day.  This will need to be addressed in discharge instructions.  At this time, Tylenol has been changed to every 4 hours.  Wells Guiles, DO 05/31/2021, 10:58 PM PGY-1, [place] Medicine Service pager [phone] placed back on

## 2021-05-31 NOTE — Progress Notes (Addendum)
Family Medicine Teaching Service Daily Progress Note Intern Pager: [phone]  Patient name: Roger Vazquez Medical record number: [id number] Date of birth: 08/21/1951 Age: 70 y.o. Gender: male  Primary Care Provider: [facility] Consultants: Nephro, IR, VVS Code Status: Full  Pt Overview and Major Events to Date:  8/5- admitted 8/6- recurrent hypoglycemia 8/8- LLE transmetatarsal amputation 8/12- shuntogram with IR, angioplasty of R brachiocephalic vein  Assessment and Plan: Mr. Beske is a 70yr old male who presented with worsening gangrene of his L foot after a recent hospitalization for amputation of his L second toe. He is now post-op day seven from a transmetatarsal amputation. Hx significant for ESRD on HD MWF, hx of lung CA, PAD, HTN, DM2, and poly substance abuse. Patient is medically stable and awaiting SNF.   Gangrene of L foot, s/p transmetatarsal amputation POD 8. Pain well-controlled. Denies pain this morning. Pt has a bed at [facility]	[place]. Await insurance authorization.  - Tylenol 500mg  q6h - PT/OT - Awaiting SNF insurance authorization    ESRD on HD Hyperkalemia Dialysis in hospital planned for today.  - Nephrology following, appreciate recommendations  - Renal diet   RUE swelling s/p angioplasty Improved. No pain. Still residual swelling noted. - Keep elevated   HTN, chronic, stable - Continue hydralazine 50mg  TID   DM2, chronic, stable  - Continue sSSI  FEN/GI: Regular PPx: Hold heparin Dispo:SNF  today or tomorrow . Barriers include insurance authorization   Subjective:  Pt eating breakfast this morning and very conversational. No complaints. Foot pain overnight that resolved with Tylenol.  Objective: Temp:  [97.7 F (36.5 C)-98.3 F (36.8 C)] 98.3 F (36.8 C) (08/17 1253) Pulse Rate:  [83-109] 83 (08/17 1258) Resp:  [15-20] 16 (08/17 1136) BP: (117-163)/(56-81) 117/60 (08/17 1258) SpO2:  [98 %-100 %] 100 % (08/17 1253) Weight:   [56.9 kg-57.4 kg] 56.9 kg (08/17 1253) Physical Exam: General: Eating breakfast, appears well, NAD Cardiovascular: RRR, transmitted fistula sounds Respiratory: Lung fields clear to auscultation, normal work of breathing on room air Abdomen: Soft, non-tender, non-distended Extremities: Right upper extremity with residual edema  Laboratory: Recent Labs  Lab 05/27/21 0407 05/28/21 0219 05/30/21 1629  WBC 9.7 10.8* 12.5*  HGB 9.2* 8.8* 10.9*  HCT 27.9* 25.9* 33.3*  PLT 339 321 354   Recent Labs  Lab 05/28/21 0219 05/29/21 0149 05/31/21 0242  NA 133* 133* 134*  K 4.9 5.5* 4.9  CL 93* 95* 95*  CO2 28 28 23   BUN 59* 76* 66*  CREATININE 8.38* 9.77* 8.38*  CALCIUM 9.2 9.2 9.6  GLUCOSE 157* 160* 146*     Imaging/Diagnostic Tests: No results found.   Orvis Brill, DO 05/31/2021, 2:15 PM PGY-1, [place] Intern pager: [phone], text pages welcome

## 2021-05-31 NOTE — Plan of Care (Signed)

## 2021-05-31 NOTE — Progress Notes (Signed)
[facility] Progress Note   Subjective:    Seen in room. His foot is hurting this morning. For dialysis today.    Objective Vitals:   05/30/21 2013 05/31/21 0000 05/31/21 0438 05/31/21 0729  BP: (P) 137/67 (!) 163/81 (!) 152/70 (!) 147/71  Pulse: (P) 97 (!) 104 (!) 106 (!) 109  Resp:  15 20   Temp: 98 F (36.7 C) 98.2 F (36.8 C) 97.9 F (36.6 C) 98.3 F (36.8 C)  TempSrc: Oral Oral Oral Oral  SpO2: (P) 100% 98% 98%   Weight:   57.4 kg   Height:       Physical Exam General: Frail appearing elderly man, nad  Heart: S1 and S2; 2/6 murmur Lungs: Clear throughout; No wheezing, rales, or rhonchi Abdomen: Soft, non-tender Extremities:No  sig LE edema; s/p L TMA  Dialysis Access: RUE AVG+bruit; RA swelling noted   Filed Weights   05/29/21 1530 05/30/21 0433 05/31/21 0438  Weight: 57.6 kg 57.8 kg 57.4 kg    Intake/Output Summary (Last 24 hours) at 05/31/2021 0928 Last data filed at 05/31/2021 0547 Gross per 24 hour  Intake 290 ml  Output --  Net 290 ml     Additional Objective Labs: Basic Metabolic Panel: Recent Labs  Lab 05/28/21 0219 05/29/21 0149 05/31/21 0242  NA 133* 133* 134*  K 4.9 5.5* 4.9  CL 93* 95* 95*  CO2 28 28 23   GLUCOSE 157* 160* 146*  BUN 59* 76* 66*  CREATININE 8.38* 9.77* 8.38*  CALCIUM 9.2 9.2 9.6  PHOS 4.3 5.0* 5.3*    Liver Function Tests: Recent Labs  Lab 05/28/21 0219 05/29/21 0149 05/31/21 0242  ALBUMIN 2.3* 2.2* 3.1*    No results for input(s): LIPASE, AMYLASE in the last 168 hours. CBC: Recent Labs  Lab 05/25/21 0804 05/26/21 0016 05/27/21 0407 05/28/21 0219 05/30/21 1629  WBC 12.1* 11.0* 9.7 10.8* 12.5*  HGB 9.4* 8.9* 9.2* 8.8* 10.9*  HCT 27.8* 27.1* 27.9* 25.9* 33.3*  MCV 101.8* 103.0* 101.8* 100.8* 103.1*  PLT 314 314 339 321 354    Blood Culture    Component Value Date/Time   SDES BLOOD LEFT FOREARM 05/18/2021 1955   SDES BLOOD LEFT HAND 05/18/2021 1955   SPECREQUEST  05/18/2021 1955     BOTTLES DRAWN AEROBIC AND ANAEROBIC Blood Culture adequate volume   SPECREQUEST  05/18/2021 1955    BOTTLES DRAWN AEROBIC AND ANAEROBIC Blood Culture adequate volume   CULT  05/18/2021 1955    NO GROWTH 5 DAYS Performed at [facility], [place] [address] [place], [place] [address]    CULT  05/18/2021 1955    NO GROWTH 5 DAYS Performed at [facility] [address], [place], [place] [address]    REPTSTATUS 05/24/2021 FINAL 05/18/2021 1955   REPTSTATUS 05/24/2021 FINAL 05/18/2021 1955    Cardiac Enzymes: No results for input(s): CKTOTAL, CKMB, CKMBINDEX, TROPONINI in the last 168 hours. CBG: Recent Labs  Lab 05/30/21 0736 05/30/21 1127 05/30/21 1550 05/30/21 2124 05/31/21 0730  GLUCAP 123* 152* 155* 107* 180*    Iron Studies: No results for input(s): IRON, TIBC, TRANSFERRIN, FERRITIN in the last 72 hours. Lab Results  Component Value Date   INR 1.1 05/18/2021   INR 1.0 04/27/2021   INR 1.08 07/31/2018   Studies/Results: No results found.  Medications:  sodium chloride Stopped (05/22/21 2314)    (feeding supplement) PROSource Plus  30 mL Oral TID BM   acetaminophen  500 mg Oral Q6H  aspirin  81 mg Oral Daily   calcium acetate  667 mg Oral TID WC   Chlorhexidine Gluconate Cloth  6 each Topical Q0600   clopidogrel  75 mg Oral Daily   darbepoetin (ARANESP) injection - DIALYSIS  60 mcg Intravenous Q Mon-HD   doxercalciferol  1 mcg Intravenous Q M,W,F-HD   feeding supplement (NEPRO CARB STEADY)  237 mL Oral BID BM   hydrALAZINE  50 mg Oral Q8H   insulin aspart  0-9 Units Subcutaneous TID WC   multivitamin  1 tablet Oral QHS   polyethylene glycol  17 g Oral Daily   senna  1 tablet Oral Daily    Dialysis Orders: [facility] MWF 3hr, 400/Autoflow 1.5, 60.kg, 2K/3Ca, AVG,  --Heparin 2,000 units IV TIW - Hectoral 64mcg IV q HD - Mircera 3mcg IV q 2 weeks - last 7/13 -Venofer 100 q. dialysis last dose to be given 8/10.   Assessment/Plan: 1. Gangrenous L Foot  S/P TMA 05/22/21: ABX completed. Per primary/VVS.  2. AMS: Multifactorial. He has baseline dementia, H/O polysubstance abuse, sepsis from foot wound. Per primary  3. ESRD -MWF. Continue on schedule. HD today  4. Anemia - Aranesp 60 q week starting 8/15.  5. Secondary hyperparathyroidism -Ca/Phos ok. Continue hectorol 1 mcg IV TIW. 6. HTN/volume - BP borderline. UF as tolerated-goal 2-3L today  7. Nutrition - Renal Diet. Low albumin. Add protein supps. 8. DMT2- Per primary  9. RUE swelling : Improving- s/p fistulogram with successful PTA per IR on 8/12  10. Dispo- Awaiting SNF placement   Marcio Hoque Larina Earthly PA-C [facility] 05/31/2021,9:28 AM

## 2021-05-31 NOTE — Progress Notes (Signed)
  Speech Language Pathology Treatment: Dysphagia  Patient Details Name: Roger Vazquez MRN: [id number] DOB: 10/01/1951 Today's Date: 05/31/2021 Time: 3276-1470 SLP Time Calculation (min) (ACUTE ONLY): 8 min  Assessment / Plan / Recommendation Clinical Impression  RN reports oral intake has been poor, pt does not initiate self feeding. SLP able to encourage pt to have a bite of banana, prolonged mastication is persistent. Verbal cue needed to elicit swallow. Assitance with meals is warranted. Pt reported not having his dentures, but after session complete SLP found them in his bed and put them in a cup with a label and reported to RN. Still, mechanical soft will be an appropriate diet texture into the future with assisted meals. No SLP f/u needed will sign off.    HPI HPI: Roger Vazquez is a 70 y.o. male who presented with AMS and worsening left foot gangrene s/p L transmetatarsal amputation 8/8. Initial swallow eval 8/7 WFL but recommending Dys 3 solids given mentation and missing dentition. SLP asked to re-evaluate as pt has been requiring increased sedation. PMHx significant for ESRD on HD MWF, hx of lung cancer, PAD, HTN, T2DM, and polysubstance abuse.      SLP Plan  All goals met       Recommendations  Diet recommendations: Dysphagia 3 (mechanical soft);Thin liquid Liquids provided via: Cup;Straw Medication Administration: Whole meds with liquid Supervision: Staff to assist with self feeding                Follow up Recommendations: 24 hour supervision/assistance Plan: All goals met       GO               [name], MA CCC-SLP  Acute Rehabilitation Services Pager [phone] Office [phone]  [name] 05/31/2021, 11:24 AM

## 2021-05-31 NOTE — Procedures (Signed)
Patient seen and examined on Hemodialysis. BP 117/60 (BP Location: Left Arm)   Pulse 83   Temp 98.3 F (36.8 C) (Axillary)   Resp 16   Ht 5\' 6"  (1.676 m)   Wt 56.9 kg   SpO2 100%   BMI 20.25 kg/m   QB 400 mL/ min via AVF, UF goal 2L  Tolerating treatment without complaints at this time.  Reports foot pain- requesting pain meds but BP on the softer side- discussed with pt.   Madelon Lips MD [facility] Pgr [phone] 2:35 PM

## 2021-05-31 NOTE — TOC Progression Note (Signed)
Transition of Care [facility]) - Progression Note    Patient Details  Name: HANCEL ION MRN: [id number] Date of Birth: January 15, 1951  Transition of Care [facility]) CM/SW [place], [place] Phone Number: 05/31/2021, 12:47 PM  Clinical Narrative:     CSW spoke with Janie with [facility]. Patients insurance authorization is pending. CSW will continue to follow and assist with dc planning needs.  Expected Discharge Plan: [place] Barriers to Discharge: Continued Medical Work up  Expected Discharge Plan and Services Expected Discharge Plan: [place] In-house Referral: Clinical Social Work     Living arrangements for the past 2 months: Single Family Home                                       Social Determinants of Health (SDOH) Interventions    Readmission Risk Interventions Readmission Risk Prevention Plan 05/30/2021  Transportation Screening Complete  SW Recovery Care/Counseling Consult Complete  Skilled Nursing Facility Complete  Some recent data might be hidden

## 2021-06-01 LAB — GLUCOSE, CAPILLARY
Glucose-Capillary: 157 mg/dL — ABNORMAL HIGH (ref 70–99)
Glucose-Capillary: 130 mg/dL — ABNORMAL HIGH (ref 70–99)
Glucose-Capillary: 221 mg/dL — ABNORMAL HIGH (ref 70–99)

## 2021-06-01 MED ORDER — GABAPENTIN 100 MG PO CAPS: 100.0000 mg | ORAL_CAPSULE | Freq: Every day | ORAL | Status: DC

## 2021-06-01 MED ORDER — ACETAMINOPHEN 500 MG PO TABS
500.0000 mg | ORAL_TABLET | ORAL | Status: DC
  Administered 2021-06-01 (×3): 500 mg via ORAL
  Filled 2021-06-01 (×3): qty 1

## 2021-06-01 MED ORDER — ACETAMINOPHEN 325 MG PO TABS: 650.0000 mg | ORAL_TABLET | Freq: Four times a day (QID) | ORAL | Status: DC | PRN

## 2021-06-01 MED ORDER — ACETAMINOPHEN 500 MG PO TABS: 500.0000 mg | ORAL_TABLET | Freq: Four times a day (QID) | ORAL | Status: DC | PRN

## 2021-06-01 NOTE — Progress Notes (Signed)
[facility] Progress Note   Subjective:    Seen in room. Completed dialysis yesterday -Even UF. No new complaints this morning. Awaiting SNF authorization.   Objective Vitals:   05/31/21 2102 05/31/21 2341 06/01/21 0341 06/01/21 0746  BP: 128/63 139/67 132/64 (!) 149/65  Pulse:  (!) 104 98 95  Resp: 15 16 14 17   Temp: 98.3 F (36.8 C) 98.3 F (36.8 C) 98.3 F (36.8 C) 98.5 F (36.9 C)  TempSrc: Oral Oral Oral Axillary  SpO2: 100% 100% 99% 99%  Weight:   56.6 kg   Height:       Physical Exam General: Frail appearing elderly man, nad  Heart: S1 and S2; 2/6 murmur Lungs: Clear throughout; No wheezing, rales, or rhonchi Abdomen: Soft, non-tender Extremities:No  sig LE edema; s/p L TMA  Dialysis Access: RUE AVG+bruit; RA swelling noted   Filed Weights   05/31/21 1253 05/31/21 1558 06/01/21 0341  Weight: 56.9 kg 56.9 kg 56.6 kg    Intake/Output Summary (Last 24 hours) at 06/01/2021 0936 Last data filed at 05/31/2021 2119 Gross per 24 hour  Intake 170 ml  Output 0 ml  Net 170 ml     Additional Objective Labs: Basic Metabolic Panel: Recent Labs  Lab 05/28/21 0219 05/29/21 0149 05/31/21 0242  NA 133* 133* 134*  K 4.9 5.5* 4.9  CL 93* 95* 95*  CO2 28 28 23   GLUCOSE 157* 160* 146*  BUN 59* 76* 66*  CREATININE 8.38* 9.77* 8.38*  CALCIUM 9.2 9.2 9.6  PHOS 4.3 5.0* 5.3*    Liver Function Tests: Recent Labs  Lab 05/28/21 0219 05/29/21 0149 05/31/21 0242  ALBUMIN 2.3* 2.2* 3.1*    No results for input(s): LIPASE, AMYLASE in the last 168 hours. CBC: Recent Labs  Lab 05/26/21 0016 05/27/21 0407 05/28/21 0219 05/30/21 1629  WBC 11.0* 9.7 10.8* 12.5*  HGB 8.9* 9.2* 8.8* 10.9*  HCT 27.1* 27.9* 25.9* 33.3*  MCV 103.0* 101.8* 100.8* 103.1*  PLT 314 339 321 354    Blood Culture    Component Value Date/Time   SDES BLOOD LEFT FOREARM 05/18/2021 1955   SDES BLOOD LEFT HAND 05/18/2021 1955   SPECREQUEST  05/18/2021 1955    BOTTLES DRAWN  AEROBIC AND ANAEROBIC Blood Culture adequate volume   SPECREQUEST  05/18/2021 1955    BOTTLES DRAWN AEROBIC AND ANAEROBIC Blood Culture adequate volume   CULT  05/18/2021 1955    NO GROWTH 5 DAYS Performed at [facility], [place] [address] [place], [place] [address]    CULT  05/18/2021 1955    NO GROWTH 5 DAYS Performed at [facility] [address] [place], [place] [address]    REPTSTATUS 05/24/2021 FINAL 05/18/2021 1955   REPTSTATUS 05/24/2021 FINAL 05/18/2021 1955    Cardiac Enzymes: No results for input(s): CKTOTAL, CKMB, CKMBINDEX, TROPONINI in the last 168 hours. CBG: Recent Labs  Lab 05/31/21 0730 05/31/21 1145 05/31/21 1704 05/31/21 2112 06/01/21 0742  GLUCAP 180* 185* 144* 131* 157*    Iron Studies: No results for input(s): IRON, TIBC, TRANSFERRIN, FERRITIN in the last 72 hours. Lab Results  Component Value Date   INR 1.1 05/18/2021   INR 1.0 04/27/2021   INR 1.08 07/31/2018   Studies/Results: No results found.  Medications:  sodium chloride Stopped (05/22/21 2314)    (feeding supplement) PROSource Plus  30 mL Oral TID BM   aspirin  81 mg Oral Daily   calcium acetate  667 mg Oral TID WC  Chlorhexidine Gluconate Cloth  6 each Topical Q0600   clopidogrel  75 mg Oral Daily   darbepoetin (ARANESP) injection - DIALYSIS  60 mcg Intravenous Q Mon-HD   doxercalciferol  1 mcg Intravenous Q M,W,F-HD   feeding supplement (NEPRO CARB STEADY)  237 mL Oral BID BM   gabapentin  100 mg Oral QHS   hydrALAZINE  50 mg Oral Q8H   insulin aspart  0-9 Units Subcutaneous TID WC   multivitamin  1 tablet Oral QHS   polyethylene glycol  17 g Oral Daily   senna  1 tablet Oral Daily    Dialysis Orders: [place] MWF 3hr, 400/Autoflow 1.5, 60.kg, 2K/3Ca, AVG,  --Heparin 2,000 units IV TIW - Hectoral 38mcg IV q HD - Mircera 58mcg IV q 2 weeks - last 7/13 -Venofer 100 q. dialysis last dose to be given 8/10.   Assessment/Plan: 1. 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 Per primary/VVS.  2. AMS: Multifactorial. He has baseline dementia. Seems to be at baseline.  3. ESRD -MWF. Continue on schedule. Next HD 8/19 4. Anemia - Aranesp 60 q week starting 8/15.  5. Secondary hyperparathyroidism -Ca/Phos ok. Phoslo binder. Continue hectorol 1 mcg IV TIW. 6. HTN/volume - BP borderline. UF as tolerated. Now below dry weight.  7. Nutrition - Renal Diet. Low albumin. Add protein supps. 8. DMT2- Per primary  9. RUE swelling : Improving- s/p fistulogram with successful PTA per IR on 8/12  10. Dispo- Awaiting SNF placement   [facility] 06/01/2021,9:36 AM

## 2021-06-01 NOTE — Progress Notes (Signed)
Out Patient Arrangements:   Please advise of d/c date.  Pt has been accepted @ [facility] on their MWF shift w/ a time of 11:50 am. The address is    [address] [place], [place] [address] [phone] phone   Linus Orn [facility] [phone]

## 2021-06-01 NOTE — TOC Transition Note (Signed)
Transition of Care [facility]) - CM/SW Discharge Note   Patient Details  Name: Roger Vazquez MRN: [id number] Date of Birth: 10/03/1951  Transition of Care [facility]) CM/SW Contact:  Trula Ore, [place] Phone Number: 06/01/2021, 3:13 PM   Clinical Narrative:     Patient will DC to: [facility]  Anticipated DC date: 06/01/2021  Family notified: [name]  Transport by: Corey Harold  ?  Per MD patient ready for DC to [facility]. RN, patient, patient's family, Pam renal navigator,and facility notified of DC. Discharge Summary sent to facility. RN given number for report [phone] TR#[id number]. DC packet on chart. Ambulance transport requested for patient.  CSW signing off.   Final next level of care: [place] ([facility]) Barriers to Discharge: No Barriers Identified   Patient Goals and CMS Choice Patient states their goals for this hospitalization and ongoing recovery are:: to go to SNF CMS Medicare.gov Compare Post Acute Care list provided to:: Other (Comment Required) (patient and patients sister [name]) Choice offered to / list presented to : Sibling, Patient (Patient and patients sister [name])  Discharge Placement              Patient chooses bed at: [facility] Patient to be transferred to facility by: [place] Name of family member notified: [name] Patient and family notified of of transfer: 06/01/21  Discharge Plan and Services In-house Referral: Clinical Social Work                                   Social Determinants of Health ([place]) Interventions     Readmission Risk Interventions Readmission Risk Prevention Plan 05/30/2021  Transportation Screening Complete  SW Recovery Care/Counseling Consult Complete  Skilled Nursing Facility Complete  Some recent data might be hidden

## 2021-06-01 NOTE — Progress Notes (Addendum)
Family Medicine Teaching Service Daily Progress Note Intern Pager: [phone]  Patient name: Roger Vazquez Medical record number: [id number] Date of birth: [birth year] Age: 70 y.o. Gender: male  Primary Care Provider: [facility] [facility] Consultants: Nephro, IR, VVS Code Status: Full  Pt Overview and Major Events to Date:  8/5- admitted 8/6- recurrent hypoglycemia 8/8- LLE transmetatarsal amputation 8/12- shuntogram with IR, angioplasty of R brachiocephalic vein  Assessment and Plan: Mr. Labrie is a 70yr old male who presented with worsening gangrene of his L foot after a recent hospitalization for amputation of his L second toe. He is now post-op day 9 from a transmetatarsal amputation. Hx significant for ESRD on HD MWF, hx of lung CA, PAD, HTN, DM2, and poly substance abuse. Patient is medically stable and awaiting SNF pending insurance authorization.   Gangrene of L foot, s/p transmetatarsal amputation POD #9. Had pain last night in bilateral feet, similar to the past 2 nights and was given Tylenol which resolved pain. No pain this morning. No edema in legs. He has a bed at [facility], [employer] authorization, which will likely take a couple more days. - Gabapentin 100mg  at bedtime - Tylenol PRN q6h - PT - Awaiting insurance authorization   ESRD on HD Completed full dialysis session yesterday, tolerated well. Scheduled for dialysis tomorrow, 8/19. - Nephrology following, appreciate recommendations  - Renal diet   HTN, chronic, stable BP ranges:130s/60s.  -Continue hydralazine TID    DM2, chronic, stable  CBGs stable- 130s-180s. No hypoglycemia.  - D/C SSI  FEN/GI: Regular PPx: Hold heparin Dispo:SNF in 3 or more days. Barriers include insurance authorization.   Subjective:  Pt pleasant and conversational this morning. Eating breakfast of bananas, pancakes. Denies any pain anywhere. No questions or concerns.  Objective: Temp:  [98 F (36.7 C)-98.5 F  (36.9 C)] 98.5 F (36.9 C) (08/18 0746) Pulse Rate:  [57-104] 95 (08/18 0746) Resp:  [14-20] 17 (08/18 0746) BP: (83-149)/(37-88) 149/65 (08/18 0746) SpO2:  [99 %-100 %] 99 % (08/18 0746) Weight:  [56.6 kg-56.9 kg] 56.6 kg (08/18 0341) Physical Exam: General: Eating breakfast, appears well, pleasant, cooperative, in NAD Cardiovascular: RRR, transmitted fistula sounds Respiratory: Lungs clear to auscultation, normal work of breathing on room air Abdomen: Soft, non-tender, non-distended. Bruising over right lower quadrant near injection site of heparin Extremities: Right upper extremity with residual edema  Laboratory: Recent Labs  Lab 05/27/21 0407 05/28/21 0219 05/30/21 1629  WBC 9.7 10.8* 12.5*  HGB 9.2* 8.8* 10.9*  HCT 27.9* 25.9* 33.3*  PLT 339 321 354   Recent Labs  Lab 05/28/21 0219 05/29/21 0149 05/31/21 0242  NA 133* 133* 134*  K 4.9 5.5* 4.9  CL 93* 95* 95*  CO2 28 28 23   BUN 59* 76* 66*  CREATININE 8.38* 9.77* 8.38*  CALCIUM 9.2 9.2 9.6  GLUCOSE 157* 160* 146*    Imaging/Diagnostic Tests: No results found.   Orvis Brill, DO 06/01/2021, 9:05 AM PGY-1, [place] Intern pager: [phone], text pages welcome

## 2021-06-01 NOTE — Progress Notes (Signed)
Physical Therapy Treatment Patient Details Name: Roger Vazquez MRN: [id number] DOB: [birth year] Today's Date: 06/01/2021    History of Present Illness Pt is a 70 y.o. male admitted 05/18/21 with AMS, worsening L foot gangrene. S/p L TMA on 8/8. Course complicated by delirium, CIWA. 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  PMH includes ESRD (HD MWF), lung CA, PAD (s/p L toe amputation), HTN, DM.    PT Comments    Pt with improvement in processing time and initiation of mobility. Pt continues to demonstrate posterior lean during standing and requires physical assistance to perform all functional mobility activities. Pt will benefit from continued aggressive mobilization to improve mobility quality and reduce falls risk. PT continues to recommend SNF placement due to high falls risk.   Follow Up Recommendations  SNF     Equipment Recommendations  Wheelchair (measurements PT);Wheelchair cushion (measurements PT)    Recommendations for Other Services       Precautions / Restrictions Precautions Precautions: Fall Precaution Comments: high falls risk Restrictions Weight Bearing Restrictions: Yes Other Position/Activity Restrictions: Heel weight bearing with DARCO shoe    Mobility  Bed Mobility Overal bed mobility: Needs Assistance Bed Mobility: Supine to Sit     Supine to sit: Mod assist;HOB elevated     General bed mobility comments: cues for hand placement, tactile cueing to initiate mobility with LEs    Transfers Overall transfer level: Needs assistance Equipment used: Rolling walker (2 wheeled);1 person hand held assist Transfers: Sit to/from [facility] Sit to Stand: Max assist Stand pivot transfers: Max assist       General transfer comment: pt with posterior lean, R knee hyperextension with attempts at standing. Pt with  difficulty pivoting, requiring some physical assistance to facilitate pivot  Ambulation/Gait                 Stairs             Wheelchair Mobility    Modified Rankin (Stroke Patients Only)       Balance Overall balance assessment: Needs assistance Sitting-balance support: Feet supported;Single extremity supported;Bilateral upper extremity supported Sitting balance-Leahy Scale: Poor Sitting balance - Comments: reliant on UE support of bed   Standing balance support: Bilateral upper extremity supported Standing balance-Leahy Scale: Poor Standing balance comment: maxA, posterior lean                            Cognition Arousal/Alertness: Awake/alert Behavior During Therapy: Flat affect Overall Cognitive Status: No family/caregiver present to determine baseline cognitive functioning Area of Impairment: Following commands;Memory;Safety/judgement;Awareness;Problem solving                     Memory: Decreased recall of precautions;Decreased short-term memory Following Commands: Follows one step commands with increased time Safety/Judgement: Decreased awareness of safety;Decreased awareness of deficits Awareness: Intellectual Problem Solving: Slow processing;Decreased initiation;Requires verbal cues;Requires tactile cues General Comments: pt with some improvement in initiation of mobility this session but processing remains slowed      Exercises      General Comments General comments (skin integrity, edema, etc.): VSS on RA      Pertinent Vitals/Pain Pain Assessment: 0-10 Pain Score: 10-Worst pain ever Pain Location: L foot Pain Descriptors / Indicators: Aching Pain Intervention(s): Monitored during session    Home Living  Prior Function            PT Goals (current goals can now be found in the care plan section) Acute Rehab PT Goals Patient Stated Goal: To get out of hospital Progress towards PT  goals: Progressing toward goals (slowly)    Frequency    Min 2X/week      PT Plan Current plan remains appropriate;Frequency needs to be updated    Co-evaluation              AM-PAC PT "6 Clicks" Mobility   Outcome Measure  Help needed turning from your back to your side while in a flat bed without using bedrails?: A Lot Help needed moving from lying on your back to sitting on the side of a flat bed without using bedrails?: A Lot Help needed moving to and from a bed to a chair (including a wheelchair)?: A Lot Help needed standing up from a chair using your arms (e.g., wheelchair or bedside chair)?: A Lot Help needed to walk in hospital room?: Total Help needed climbing 3-5 steps with a railing? : Total 6 Click Score: 10    End of Session   Activity Tolerance: Patient tolerated treatment well Patient left: in chair;with call bell/phone within reach;with chair alarm set Nurse Communication: Mobility status;Need for lift equipment (STEDY or +2 transfer) PT Visit Diagnosis: Other abnormalities of gait and mobility (R26.89);Difficulty in walking, not elsewhere classified (R26.2) Pain - Right/Left: Left Pain - part of body: Ankle and joints of foot     Time: 2355-7322 PT Time Calculation (min) (ACUTE ONLY): 24 min  Charges:  $Therapeutic Activity: 23-37 mins                     Zenaida Niece, PT, DPT Acute Rehabilitation Pager: [phone]    Zenaida Niece 06/01/2021, 1:44 PM

## 2021-06-01 NOTE — Progress Notes (Signed)
OT Cancellation Note  Patient Details Name: AGNES PROBERT MRN: [id number] DOB: [birth year]   Cancelled Treatment:    Reason Eval/Treat Not Completed: Patient declined, no reason specified. Pt refused, reporting that he was having chills and could not participate in therapy at this time, despite OT encouragement and education.  Shey Bartmess H., OTR/L Acute Rehabilitation  Ruthellen Tippy Elane Yolanda Bonine 06/01/2021, 4:36 PM

## 2021-06-01 NOTE — TOC Progression Note (Addendum)
Transition of Care [facility]) - Progression Note    Patient Details  Name: BRYNE LINDON MRN: [id number] Date of Birth: 22-Mar-1951  Transition of Care [facility]) CM/SW [place], [place] Phone Number: 06/01/2021, 10:14 AM  Clinical Narrative:     CSW received call from [facility] with [facility]. Insurance authorization has been approved. MD informed. Renal Navigator Pam informed.  CSW received call from [place] with [facility] who reports patients insurance is not managed by [place] and is not regular [employer]. It is managed by [employer]. Patients insurance authorization is pending and could take a couple more days. Narda Rutherford confirmed with CSW as soon as she gets insurance authorization she will notify CSW. CSW informed MD. Patient has SNF bed at [facility]. Insurance authorization is pending. CSW updated patients sister [name]. CSW will continue to follow and assist with dc planning needs.  Expected Discharge Plan: [place] Barriers to Discharge: Continued Medical Work up  Expected Discharge Plan and Services Expected Discharge Plan: [place] In-house Referral: Clinical Social Work     Living arrangements for the past 2 months: Single Family Home                                       Social Determinants of Health (SDOH) Interventions    Readmission Risk Interventions Readmission Risk Prevention Plan 05/30/2021  Transportation Screening Complete  SW Recovery Care/Counseling Consult Complete  Skilled Nursing Facility Complete  Some recent data might be hidden

## 2021-06-06 ENCOUNTER — Ambulatory Visit (INDEPENDENT_AMBULATORY_CARE_PROVIDER_SITE_OTHER): Payer: No Typology Code available for payment source | Admitting: Vascular Surgery

## 2021-06-06 ENCOUNTER — Other Ambulatory Visit: Payer: Self-pay

## 2021-06-06 VITALS — BP 147/75 | HR 99 | Temp 99.4°F | Resp 20 | Ht 66.0 in | Wt 124.0 lb

## 2021-06-06 DIAGNOSIS — I96 Gangrene, not elsewhere classified: Secondary | ICD-10-CM

## 2021-06-06 NOTE — Progress Notes (Signed)
[facility] OF [place]  ASSESSMENT / PLAN: 70 y.o. male with progression of gangrene of left forefoot status post left second toe amputation and foot debridement 05/04/21 followed by transmetatarsal amputation 05/22/21.  He is maximally revascularized. He will need left below knee amputation. Will plan to do this Friday 06/09/21.  CHIEF COMPLAINT: gangrene left foot  HISTORY OF PRESENT ILLNESS: STRYDER POITRA is a 70 y.o. male well-known to me, who presents the office from a skilled nursing facility for evaluation of his left transmetatarsal amputation.  Unfortunately this is not healing.  It is frankly gangrenous.  The patient has limited understanding.  I spoke extensively with his sister who helps him make decisions.  I counseled him extensively about the nature of nonsalvageable ischemia, and the best option is to proceed with amputation.  They are understanding.  Past Medical History:  Diagnosis Date   Cancer ([place])    Lung   Diabetes mellitus    Hypertension    Renal disorder    [facility]WF- [facility]    Past Surgical History:  Procedure Laterality Date   ABDOMINAL AORTOGRAM W/LOWER EXTREMITY Left 04/28/2021   Procedure: ABDOMINAL AORTOGRAM W/LOWER EXTREMITY;  Surgeon: Cherre Robins, MD;  Location: [facility];  Service: Cardiovascular;  Laterality: Left;   AMPUTATION Left 05/04/2021   Procedure: LEFT SECOND TOE AMPUTATION;  Surgeon: Cherre Robins, MD;  Location: [facility];  Service: Vascular;  Laterality: Left;   artiovenous graft placement Left    AV FISTULA PLACEMENT Right 11/27/2018   Procedure: INSERTION 4-7MM X 45CM GORETEX GRAFT RIGHT FOREARM ;  Surgeon: Rosetta Posner, MD;  Location: [facility];  Service: Vascular;  Laterality: Right;   [place] REMOVAL Left 08/06/2018   Procedure: REMOVAL OF LEFT ARM GRAFT;  Surgeon: Rosetta Posner, MD;  Location: [facility];  Service: Vascular;  Laterality: Left;   INSERTION OF DIALYSIS CATHETER Right 08/08/2018   Procedure:  INSERTION OF TUNNELED  DIALYSIS CATHETER;  Surgeon: Marty Heck, MD;  Location: [facility];  Service: Vascular;  Laterality: Right;   IR DIALY SHUNT INTRO [place] W/IMG RIGHT Right 05/26/2021   IR REMOVAL TUN CV CATH W/O FL  02/19/2019   IR US GUIDE VASC ACCESS RIGHT  05/26/2021   LUNG SURGERY     patient not if they removed part of lung. Was done at [facility] Left 04/28/2021   Procedure: PERIPHERAL VASCULAR BALLOON ANGIOPLASTY;  Surgeon: Cherre Robins, MD;  Location: [facility];  Service: Cardiovascular;  Laterality: Left;  popliteal and posterial tibial   TEE WITHOUT CARDIOVERSION N/A 07/31/2018   Procedure: TRANSESOPHAGEAL ECHOCARDIOGRAM (TEE);  Surgeon: Pixie Casino, MD;  Location: [facility];  Service: Cardiovascular;  Laterality: N/A;   TRANSMETATARSAL AMPUTATION Left 05/22/2021   Procedure: LEFT TRANSMETATARSAL AMPUTATION;  Surgeon: Elam Dutch, MD;  Location: [facility];  Service: Vascular;  Laterality: Left;    No family history on file.  Social History   Socioeconomic History   Marital status: Divorced    Spouse name: Not on file   Number of children: Not on file   Years of education: Not on file   Highest education level: Not on file  Occupational History   Not on file  Tobacco Use   Smoking status: Former    Packs/day: 0.50    Years: 10.00    Pack years: 5.00    Types: Cigarettes    Quit date: 2001  Years since quitting: 21.6   Smokeless tobacco: Former    Quit date: 08/01/2015  Vaping Use   Vaping Use: Never used  Substance and Sexual Activity   Alcohol use: Yes    Alcohol/week: 2.0 standard drinks    Types: 2 Cans of beer per week   Drug use: Yes    Types: Marijuana    Comment: last time 11/19/2018   Sexual activity: Not on file    Comment: Pt uses vicodin  Other Topics Concern   Not on file  Social History Narrative   Not on file   Social Determinants of Health   Financial Resource  Strain: Not on file  Food Insecurity: Not on file  Transportation Needs: Not on file  Physical Activity: Not on file  Stress: Not on file  Social Connections: Not on file  Intimate Partner Violence: Not on file    Allergies  Allergen Reactions   Sildenafil Other (See Comments)    Pt stated, "Makes my Blood Pressure drop"    Current Outpatient Medications  Medication Sig Dispense Refill   acetaminophen (TYLENOL) 500 MG tablet Take 1 tablet (500 mg total) by mouth every 6 (six) hours. 30 tablet 0   aspirin 81 MG tablet Take 81 mg by mouth daily.       calcium acetate (PHOSLO) 667 MG capsule Take 667 mg by mouth 3 (three) times daily with meals.     calcium carbonate (OS-CAL) 1250 (500 Ca) MG chewable tablet Chew 1 tablet by mouth daily as needed for heartburn.     clopidogrel (PLAVIX) 75 MG tablet Take 1 tablet (75 mg total) by mouth daily. 30 tablet 0   lidocaine-prilocaine (EMLA) cream Apply 1 application topically as needed.     Multiple Vitamin (MULITIVITAMIN WITH MINERALS) TABS Take 1 tablet by mouth daily. 30 tablet 0   polyethylene glycol (MIRALAX / GLYCOLAX) 17 g packet Take 17 g by mouth 2 (two) times daily. 14 each 0   senna (SENOKOT) 8.6 MG TABS tablet Take 1 tablet (8.6 mg total) by mouth daily. 7 tablet 0   No current facility-administered medications for this visit.    REVIEW OF SYSTEMS:  [X]  denotes positive finding, [ ]  denotes negative finding Cardiac  Comments:  Chest pain or chest pressure:    Shortness of breath upon exertion:    Short of breath when lying flat:    Irregular heart rhythm:        Vascular    Pain in calf, thigh, or hip brought on by ambulation:    Pain in feet at night that wakes you up from your sleep:     Blood clot in your veins:    Leg swelling:         Pulmonary    Oxygen at home:    Productive cough:     Wheezing:         Neurologic    Sudden weakness in arms or legs:     Sudden numbness in arms or legs:     Sudden onset of  difficulty speaking or slurred speech:    Temporary loss of vision in one eye:     Problems with dizziness:         Gastrointestinal    Blood in stool:     Vomited blood:         Genitourinary    Burning when urinating:     Blood in urine:        Psychiatric  Major depression:         Hematologic    Bleeding problems:    Problems with blood clotting too easily:        Skin    Rashes or ulcers:        Constitutional    Fever or chills:      PHYSICAL EXAM Vitals:   06/06/21 1348  BP: (!) 147/75  Pulse: 99  Resp: 20  Temp: 99.4 F (37.4 C)  SpO2: 100%  Weight: 124 lb (56.2 kg)  Height: 5\' 6"  (1.676 m)    Constitutional: chronically ill appearing older than stated age. no distress. Appears well nourished.  Neurologic: CN intact. no focal findings. no sensory loss. Psychiatric:  Mood and affect symmetric and appropriate. Eyes:  No icterus. No conjunctival pallor. Ears, nose, throat:  mucous membranes moist. Midline trachea.  Cardiac: regular rate and rhythm.  Respiratory:  unlabored. Abdominal: non-distended.  Peripheral vascular: gangrenous L TMA with foul odor Extremity: no edema. no cyanosis. no pallor.  Skin: + gangrene. no ulceration.  Lymphatic: no Stemmer's sign. no palpable lymphadenopathy.  PERTINENT LABORATORY AND RADIOLOGIC DATA  Most recent CBC CBC Latest Ref Rng & Units 05/30/2021 05/28/2021 05/27/2021  WBC 4.0 - 10.5 K/uL 12.5(H) 10.8(H) 9.7  Hemoglobin 13.0 - 17.0 g/dL 10.9(L) 8.8(L) 9.2(L)  Hematocrit 39.0 - 52.0 % 33.3(L) 25.9(L) 27.9(L)  Platelets 150 - 400 K/uL 354 321 339     Most recent CMP CMP Latest Ref Rng & Units 05/31/2021 05/29/2021 05/28/2021  Glucose 70 - 99 mg/dL 146(H) 160(H) 157(H)  BUN 8 - 23 mg/dL 66(H) 76(H) 59(H)  Creatinine 0.61 - 1.24 mg/dL 8.38(H) 9.77(H) 8.38(H)  Sodium 135 - 145 mmol/L 134(L) 133(L) 133(L)  Potassium 3.5 - 5.1 mmol/L 4.9 5.5(H) 4.9  Chloride 98 - 111 mmol/L 95(L) 95(L) 93(L)  CO2 22 - 32 mmol/L 23  28 28   Calcium 8.9 - 10.3 mg/dL 9.6 9.2 9.2  Total Protein 6.5 - 8.1 g/dL - - -  Total Bilirubin 0.3 - 1.2 mg/dL - - -  Alkaline Phos 38 - 126 U/L - - -  AST 15 - 41 U/L - - -  ALT 0 - 44 U/L - - -    Renal function Estimated Creatinine Clearance: 6.4 mL/min (A) (by C-G formula based on SCr of 8.38 mg/dL (H)).  Hgb A1c MFr Bld (%)  Date Value  04/27/2021 7.9 (H)    LDL Cholesterol  Date Value Ref Range Status  04/29/2021 28 0 - 99 mg/dL Final    Comment:           Total Cholesterol/HDL:CHD Risk Coronary Heart Disease Risk Table                     Men   Women  1/2 Average Risk   3.4   3.3  Average Risk       5.0   4.4  2 X Average Risk   9.6   7.1  3 X Average Risk  23.4   11.0        Use the calculated Patient Ratio above and the CHD Risk Table to determine the patient's CHD Risk.        ATP III CLASSIFICATION (LDL):  <100     mg/dL   Optimal  100-129  mg/dL   Near or Above                    Optimal  130-159  mg/dL   Borderline  160-189  mg/dL   High  >190     mg/dL   Very High Performed at [facility] [address], [place], [place] [address]      [name]. Stanford Breed, MD [facility] Phone Number: [phone] 06/06/2021 2:37 PM  Total time spent on preparing this encounter including chart review, data review, collecting history, examining the patient, coordinating care for this established patient, 20 minutes.  Portions of this report may have been transcribed using voice recognition software.  Every effort has been made to ensure accuracy; however, inadvertent computerized transcription errors may still be present.

## 2021-06-06 NOTE — H&P (View-Only) (Signed)
[facility] OF [place]  ASSESSMENT / PLAN: 70 y.o. male with progression of gangrene of left forefoot status post left second toe amputation and foot debridement 05/04/21 followed by transmetatarsal amputation 05/22/21.  He is maximally revascularized. He will need left below knee amputation. Will plan to do this Friday 06/09/21.  CHIEF COMPLAINT: gangrene left foot  HISTORY OF PRESENT ILLNESS: Roger Vazquez is a 70 y.o. male well-known to me, who presents the office from a skilled nursing facility for evaluation of his left transmetatarsal amputation.  Unfortunately this is not healing.  It is frankly gangrenous.  The patient has limited understanding.  I spoke extensively with his sister who helps him make decisions.  I counseled him extensively about the nature of nonsalvageable ischemia, and the best option is to proceed with amputation.  They are understanding.  Past Medical History:  Diagnosis Date   Cancer ([place])    Lung   Diabetes mellitus    Hypertension    Renal disorder    [facility]    Past Surgical History:  Procedure Laterality Date   ABDOMINAL AORTOGRAM W/LOWER EXTREMITY Left 04/28/2021   Procedure: ABDOMINAL AORTOGRAM W/LOWER EXTREMITY;  Surgeon: Cherre Robins, MD;  Location: [facility];  Service: Cardiovascular;  Laterality: Left;   AMPUTATION Left 05/04/2021   Procedure: LEFT SECOND TOE AMPUTATION;  Surgeon: Cherre Robins, MD;  Location: [facility];  Service: Vascular;  Laterality: Left;   artiovenous graft placement Left    AV FISTULA PLACEMENT Right 11/27/2018   Procedure: INSERTION 4-7MM X 45CM GORETEX GRAFT RIGHT FOREARM ;  Surgeon: Rosetta Posner, MD;  Location: [facility];  Service: Vascular;  Laterality: Right;   [place] REMOVAL Left 08/06/2018   Procedure: REMOVAL OF LEFT ARM GRAFT;  Surgeon: Rosetta Posner, MD;  Location: [facility];  Service: Vascular;  Laterality: Left;   INSERTION OF DIALYSIS CATHETER Right 08/08/2018   Procedure:  INSERTION OF TUNNELED  DIALYSIS CATHETER;  Surgeon: Marty Heck, MD;  Location: [facility];  Service: Vascular;  Laterality: Right;   IR DIALY SHUNT INTRO [place] W/IMG RIGHT Right 05/26/2021   IR REMOVAL TUN CV CATH W/O FL  02/19/2019   IR US GUIDE VASC ACCESS RIGHT  05/26/2021   LUNG SURGERY     patient not if they removed part of lung. Was done at [facility] Left 04/28/2021   Procedure: PERIPHERAL VASCULAR BALLOON ANGIOPLASTY;  Surgeon: Cherre Robins, MD;  Location: [facility];  Service: Cardiovascular;  Laterality: Left;  popliteal and posterial tibial   TEE WITHOUT CARDIOVERSION N/A 07/31/2018   Procedure: TRANSESOPHAGEAL ECHOCARDIOGRAM (TEE);  Surgeon: Pixie Casino, MD;  Location: [facility];  Service: Cardiovascular;  Laterality: N/A;   TRANSMETATARSAL AMPUTATION Left 05/22/2021   Procedure: LEFT TRANSMETATARSAL AMPUTATION;  Surgeon: Elam Dutch, MD;  Location: [facility];  Service: Vascular;  Laterality: Left;    No family history on file.  Social History   Socioeconomic History   Marital status: Divorced    Spouse name: Not on file   Number of children: Not on file   Years of education: Not on file   Highest education level: Not on file  Occupational History   Not on file  Tobacco Use   Smoking status: Former    Packs/day: 0.50    Years: 10.00    Pack years: 5.00    Types: Cigarettes    Quit date: 2001  Years since quitting: 21.6   Smokeless tobacco: Former    Quit date: 08/01/2015  Vaping Use   Vaping Use: Never used  Substance and Sexual Activity   Alcohol use: Yes    Alcohol/week: 2.0 standard drinks    Types: 2 Cans of beer per week   Drug use: Yes    Types: Marijuana    Comment: last time 11/19/2018   Sexual activity: Not on file    Comment: Pt uses vicodin  Other Topics Concern   Not on file  Social History Narrative   Not on file   Social Determinants of Health   Financial Resource  Strain: Not on file  Food Insecurity: Not on file  Transportation Needs: Not on file  Physical Activity: Not on file  Stress: Not on file  Social Connections: Not on file  Intimate Partner Violence: Not on file    Allergies  Allergen Reactions   Sildenafil Other (See Comments)    Pt stated, "Makes my Blood Pressure drop"    Current Outpatient Medications  Medication Sig Dispense Refill   acetaminophen (TYLENOL) 500 MG tablet Take 1 tablet (500 mg total) by mouth every 6 (six) hours. 30 tablet 0   aspirin 81 MG tablet Take 81 mg by mouth daily.       calcium acetate (PHOSLO) 667 MG capsule Take 667 mg by mouth 3 (three) times daily with meals.     calcium carbonate (OS-CAL) 1250 (500 Ca) MG chewable tablet Chew 1 tablet by mouth daily as needed for heartburn.     clopidogrel (PLAVIX) 75 MG tablet Take 1 tablet (75 mg total) by mouth daily. 30 tablet 0   lidocaine-prilocaine (EMLA) cream Apply 1 application topically as needed.     Multiple Vitamin (MULITIVITAMIN WITH MINERALS) TABS Take 1 tablet by mouth daily. 30 tablet 0   polyethylene glycol (MIRALAX / GLYCOLAX) 17 g packet Take 17 g by mouth 2 (two) times daily. 14 each 0   senna (SENOKOT) 8.6 MG TABS tablet Take 1 tablet (8.6 mg total) by mouth daily. 7 tablet 0   No current facility-administered medications for this visit.    REVIEW OF SYSTEMS:  [X]  denotes positive finding, [ ]  denotes negative finding Cardiac  Comments:  Chest pain or chest pressure:    Shortness of breath upon exertion:    Short of breath when lying flat:    Irregular heart rhythm:        Vascular    Pain in calf, thigh, or hip brought on by ambulation:    Pain in feet at night that wakes you up from your sleep:     Blood clot in your veins:    Leg swelling:         Pulmonary    Oxygen at home:    Productive cough:     Wheezing:         Neurologic    Sudden weakness in arms or legs:     Sudden numbness in arms or legs:     Sudden onset of  difficulty speaking or slurred speech:    Temporary loss of vision in one eye:     Problems with dizziness:         Gastrointestinal    Blood in stool:     Vomited blood:         Genitourinary    Burning when urinating:     Blood in urine:        Psychiatric  Major depression:         Hematologic    Bleeding problems:    Problems with blood clotting too easily:        Skin    Rashes or ulcers:        Constitutional    Fever or chills:      PHYSICAL EXAM Vitals:   06/06/21 1348  BP: (!) 147/75  Pulse: 99  Resp: 20  Temp: 99.4 F (37.4 C)  SpO2: 100%  Weight: 124 lb (56.2 kg)  Height: 5\' 6"  (1.676 m)    Constitutional: chronically ill appearing older than stated age. no distress. Appears well nourished.  Neurologic: CN intact. no focal findings. no sensory loss. Psychiatric:  Mood and affect symmetric and appropriate. Eyes:  No icterus. No conjunctival pallor. Ears, nose, throat:  mucous membranes moist. Midline trachea.  Cardiac: regular rate and rhythm.  Respiratory:  unlabored. Abdominal: non-distended.  Peripheral vascular: gangrenous L TMA with foul odor Extremity: no edema. no cyanosis. no pallor.  Skin: + gangrene. no ulceration.  Lymphatic: no Stemmer's sign. no palpable lymphadenopathy.  PERTINENT LABORATORY AND RADIOLOGIC DATA  Most recent CBC CBC Latest Ref Rng & Units 05/30/2021 05/28/2021 05/27/2021  WBC 4.0 - 10.5 K/uL 12.5(H) 10.8(H) 9.7  Hemoglobin 13.0 - 17.0 g/dL 10.9(L) 8.8(L) 9.2(L)  Hematocrit 39.0 - 52.0 % 33.3(L) 25.9(L) 27.9(L)  Platelets 150 - 400 K/uL 354 321 339     Most recent CMP CMP Latest Ref Rng & Units 05/31/2021 05/29/2021 05/28/2021  Glucose 70 - 99 mg/dL 146(H) 160(H) 157(H)  BUN 8 - 23 mg/dL 66(H) 76(H) 59(H)  Creatinine 0.61 - 1.24 mg/dL 8.38(H) 9.77(H) 8.38(H)  Sodium 135 - 145 mmol/L 134(L) 133(L) 133(L)  Potassium 3.5 - 5.1 mmol/L 4.9 5.5(H) 4.9  Chloride 98 - 111 mmol/L 95(L) 95(L) 93(L)  CO2 22 - 32 mmol/L 23  28 28   Calcium 8.9 - 10.3 mg/dL 9.6 9.2 9.2  Total Protein 6.5 - 8.1 g/dL - - -  Total Bilirubin 0.3 - 1.2 mg/dL - - -  Alkaline Phos 38 - 126 U/L - - -  AST 15 - 41 U/L - - -  ALT 0 - 44 U/L - - -    Renal function Estimated Creatinine Clearance: 6.4 mL/min (A) (by C-G formula based on SCr of 8.38 mg/dL (H)).  Hgb A1c MFr Bld (%)  Date Value  04/27/2021 7.9 (H)    LDL Cholesterol  Date Value Ref Range Status  04/29/2021 28 0 - 99 mg/dL Final    Comment:           Total Cholesterol/HDL:CHD Risk Coronary Heart Disease Risk Table                     Men   Women  1/2 Average Risk   3.4   3.3  Average Risk       5.0   4.4  2 X Average Risk   9.6   7.1  3 X Average Risk  23.4   11.0        Use the calculated Patient Ratio above and the CHD Risk Table to determine the patient's CHD Risk.        ATP III CLASSIFICATION (LDL):  <100     mg/dL   Optimal  100-129  mg/dL   Near or Above                    Optimal  130-159  mg/dL   Borderline  160-189  mg/dL   High  >190     mg/dL   Very High Performed at [facility] [address] [place], [place] [address]      [name]. Stanford Breed, MD [facility] Phone Number: [phone] 06/06/2021 2:37 PM  Total time spent on preparing this encounter including chart review, data review, collecting history, examining the patient, coordinating care for this established patient, 20 minutes.  Portions of this report may have been transcribed using voice recognition software.  Every effort has been made to ensure accuracy; however, inadvertent computerized transcription errors may still be present.

## 2021-06-07 ENCOUNTER — Other Ambulatory Visit: Payer: Self-pay

## 2021-06-07 ENCOUNTER — Encounter (HOSPITAL_COMMUNITY): Payer: Self-pay | Admitting: Vascular Surgery

## 2021-06-07 NOTE — Progress Notes (Signed)
Spoke with Pt. Nurse Walker Kehr LPN at nursing home. Instructions for surgery and medical history reviewed.   Per Dr. Mora Appl office Pt to continue Plavix and ASA.   Pt. Needs covid test DOS.

## 2021-06-08 ENCOUNTER — Encounter (HOSPITAL_COMMUNITY)
Admission: RE | Disposition: A | Discharge status: Skilled Nursing Facility | Payer: Self-pay | Source: Skilled Nursing Facility | Attending: Family Medicine

## 2021-06-08 ENCOUNTER — Inpatient Hospital Stay (HOSPITAL_COMMUNITY): Payer: Medicare HMO | Admitting: Anesthesiology

## 2021-06-08 ENCOUNTER — Encounter (HOSPITAL_COMMUNITY): Payer: Self-pay | Admitting: Vascular Surgery

## 2021-06-08 ENCOUNTER — Inpatient Hospital Stay (HOSPITAL_COMMUNITY)
Admission: RE | Admit: 2021-06-08 | Discharge: 2021-06-13 | DRG: 474 | Disposition: A | Discharge status: Skilled Nursing Facility | Payer: Medicare HMO | Source: Skilled Nursing Facility | Attending: Family Medicine | Admitting: Family Medicine

## 2021-06-08 DIAGNOSIS — I2729 Other secondary pulmonary hypertension: Secondary | ICD-10-CM | POA: Diagnosis present

## 2021-06-08 DIAGNOSIS — I70262 Atherosclerosis of native arteries of extremities with gangrene, left leg: Secondary | ICD-10-CM | POA: Diagnosis present

## 2021-06-08 DIAGNOSIS — Z992 Dependence on renal dialysis: Secondary | ICD-10-CM | POA: Diagnosis not present

## 2021-06-08 DIAGNOSIS — E1122 Type 2 diabetes mellitus with diabetic chronic kidney disease: Secondary | ICD-10-CM

## 2021-06-08 DIAGNOSIS — E871 Hypo-osmolality and hyponatremia: Secondary | ICD-10-CM | POA: Diagnosis not present

## 2021-06-08 DIAGNOSIS — Z79899 Other long term (current) drug therapy: Secondary | ICD-10-CM | POA: Diagnosis not present

## 2021-06-08 DIAGNOSIS — I440 Atrioventricular block, first degree: Secondary | ICD-10-CM | POA: Diagnosis not present

## 2021-06-08 DIAGNOSIS — Y793 Surgical instruments, materials and orthopedic devices (including sutures) associated with adverse incidents: Secondary | ICD-10-CM | POA: Diagnosis present

## 2021-06-08 DIAGNOSIS — E1152 Type 2 diabetes mellitus with diabetic peripheral angiopathy with gangrene: Secondary | ICD-10-CM | POA: Diagnosis present

## 2021-06-08 DIAGNOSIS — E11649 Type 2 diabetes mellitus with hypoglycemia without coma: Secondary | ICD-10-CM | POA: Diagnosis not present

## 2021-06-08 DIAGNOSIS — Z85118 Personal history of other malignant neoplasm of bronchus and lung: Secondary | ICD-10-CM | POA: Diagnosis not present

## 2021-06-08 DIAGNOSIS — F1010 Alcohol abuse, uncomplicated: Secondary | ICD-10-CM | POA: Diagnosis present

## 2021-06-08 DIAGNOSIS — T8754 Necrosis of amputation stump, left lower extremity: Principal | ICD-10-CM | POA: Diagnosis present

## 2021-06-08 DIAGNOSIS — N2581 Secondary hyperparathyroidism of renal origin: Secondary | ICD-10-CM | POA: Diagnosis present

## 2021-06-08 DIAGNOSIS — Z9114 Patient's other noncompliance with medication regimen: Secondary | ICD-10-CM

## 2021-06-08 DIAGNOSIS — Z7902 Long term (current) use of antithrombotics/antiplatelets: Secondary | ICD-10-CM | POA: Diagnosis not present

## 2021-06-08 DIAGNOSIS — I739 Peripheral vascular disease, unspecified: Secondary | ICD-10-CM

## 2021-06-08 DIAGNOSIS — Z20822 Contact with and (suspected) exposure to covid-19: Secondary | ICD-10-CM | POA: Diagnosis present

## 2021-06-08 DIAGNOSIS — D631 Anemia in chronic kidney disease: Secondary | ICD-10-CM | POA: Diagnosis present

## 2021-06-08 DIAGNOSIS — Z89512 Acquired absence of left leg below knee: Secondary | ICD-10-CM

## 2021-06-08 DIAGNOSIS — D62 Acute posthemorrhagic anemia: Secondary | ICD-10-CM | POA: Diagnosis not present

## 2021-06-08 DIAGNOSIS — I120 Hypertensive chronic kidney disease with stage 5 chronic kidney disease or end stage renal disease: Secondary | ICD-10-CM | POA: Diagnosis present

## 2021-06-08 DIAGNOSIS — N186 End stage renal disease: Secondary | ICD-10-CM

## 2021-06-08 DIAGNOSIS — R509 Fever, unspecified: Secondary | ICD-10-CM | POA: Diagnosis present

## 2021-06-08 DIAGNOSIS — Z87891 Personal history of nicotine dependence: Secondary | ICD-10-CM | POA: Diagnosis not present

## 2021-06-08 DIAGNOSIS — K5909 Other constipation: Secondary | ICD-10-CM | POA: Diagnosis present

## 2021-06-08 HISTORY — DX: Anemia, unspecified: D64.9

## 2021-06-08 HISTORY — PX: APPLICATION OF WOUND VAC: SHX5189

## 2021-06-08 HISTORY — DX: Inflammatory liver disease, unspecified: K75.9

## 2021-06-08 HISTORY — DX: Disease of blood and blood-forming organs, unspecified: D75.9

## 2021-06-08 HISTORY — PX: AMPUTATION: SHX166

## 2021-06-08 LAB — CBC
HCT: 28.4 % — ABNORMAL LOW (ref 39.0–52.0)
Hemoglobin: 9 g/dL — ABNORMAL LOW (ref 13.0–17.0)
MCH: 33.2 pg (ref 26.0–34.0)
MCHC: 31.7 g/dL (ref 30.0–36.0)
MCV: 104.8 fL — ABNORMAL HIGH (ref 80.0–100.0)
Platelets: 480 10*3/uL — ABNORMAL HIGH (ref 150–400)
RBC: 2.71 MIL/uL — ABNORMAL LOW (ref 4.22–5.81)
RDW: 16.2 % — ABNORMAL HIGH (ref 11.5–15.5)
WBC: 19.9 10*3/uL — ABNORMAL HIGH (ref 4.0–10.5)
nRBC: 0 % (ref 0.0–0.2)

## 2021-06-08 LAB — COMPREHENSIVE METABOLIC PANEL
ALT: 19 U/L (ref 0–44)
AST: 36 U/L (ref 15–41)
Albumin: 2.9 g/dL — ABNORMAL LOW (ref 3.5–5.0)
Alkaline Phosphatase: 91 U/L (ref 38–126)
Anion gap: 20 — ABNORMAL HIGH (ref 5–15)
BUN: 29 mg/dL — ABNORMAL HIGH (ref 8–23)
CO2: 26 mmol/L (ref 22–32)
Calcium: 9.3 mg/dL (ref 8.9–10.3)
Chloride: 92 mmol/L — ABNORMAL LOW (ref 98–111)
Creatinine, Ser: 6.37 mg/dL — ABNORMAL HIGH (ref 0.61–1.24)
GFR, Estimated: 9 mL/min — ABNORMAL LOW (ref >60)
Glucose, Bld: 161 mg/dL — ABNORMAL HIGH (ref 70–99)
Potassium: 4.1 mmol/L (ref 3.5–5.1)
Sodium: 138 mmol/L (ref 135–145)
Total Bilirubin: 1.3 mg/dL — ABNORMAL HIGH (ref 0.3–1.2)
Total Protein: 8.7 g/dL — ABNORMAL HIGH (ref 6.5–8.1)

## 2021-06-08 LAB — GLUCOSE, CAPILLARY
Glucose-Capillary: 150 mg/dL — ABNORMAL HIGH (ref 70–99)
Glucose-Capillary: 169 mg/dL — ABNORMAL HIGH (ref 70–99)
Glucose-Capillary: 171 mg/dL — ABNORMAL HIGH (ref 70–99)

## 2021-06-08 LAB — SURGICAL PCR SCREEN
MRSA, PCR: NEGATIVE
Staphylococcus aureus: NEGATIVE

## 2021-06-08 LAB — APTT: aPTT: 35 seconds (ref 24–36)

## 2021-06-08 LAB — SARS CORONAVIRUS 2 BY RT PCR (HOSPITAL ORDER, PERFORMED IN ~~LOC~~ HOSPITAL LAB): SARS Coronavirus 2: NEGATIVE

## 2021-06-08 LAB — PROTIME-INR
INR: 1.1 (ref 0.8–1.2)
Prothrombin Time: 13.7 seconds (ref 11.4–15.2)

## 2021-06-08 SURGERY — AMPUTATION BELOW KNEE
Anesthesia: General | Site: Leg Lower | Laterality: Left

## 2021-06-08 MED ORDER — PHENYLEPHRINE 40 MCG/ML (10ML) SYRINGE FOR IV PUSH (FOR BLOOD PRESSURE SUPPORT)
PREFILLED_SYRINGE | INTRAVENOUS | Status: DC | PRN
  Administered 2021-06-08: 120 ug via INTRAVENOUS
  Administered 2021-06-08 (×3): 80 ug via INTRAVENOUS
  Administered 2021-06-08: 120 ug via INTRAVENOUS
  Administered 2021-06-08: 80 ug via INTRAVENOUS

## 2021-06-08 MED ORDER — BUPIVACAINE LIPOSOME 1.3 % IJ SUSP
INTRAMUSCULAR | Status: DC | PRN
  Administered 2021-06-08: 10 mL via PERINEURAL

## 2021-06-08 MED ORDER — DEXAMETHASONE SODIUM PHOSPHATE 10 MG/ML IJ SOLN
INTRAMUSCULAR | Status: DC | PRN
  Administered 2021-06-08: 4 mg via INTRAVENOUS

## 2021-06-08 MED ORDER — ONDANSETRON HCL 4 MG/2ML IJ SOLN
INTRAMUSCULAR | Status: AC
  Filled 2021-06-08: qty 2

## 2021-06-08 MED ORDER — OXYCODONE-ACETAMINOPHEN 5-325 MG PO TABS
1.0000 | ORAL_TABLET | ORAL | Status: DC | PRN
  Administered 2021-06-08 – 2021-06-09 (×2): 1 via ORAL
  Filled 2021-06-08 (×2): qty 1

## 2021-06-08 MED ORDER — ACETAMINOPHEN 500 MG PO TABS
500.0000 mg | ORAL_TABLET | Freq: Four times a day (QID) | ORAL | Status: DC
  Administered 2021-06-08 – 2021-06-13 (×18): 500 mg via ORAL
  Filled 2021-06-08 (×18): qty 1

## 2021-06-08 MED ORDER — MIDAZOLAM HCL 2 MG/2ML IJ SOLN
INTRAMUSCULAR | Status: AC
  Filled 2021-06-08: qty 2

## 2021-06-08 MED ORDER — 0.9 % SODIUM CHLORIDE (POUR BTL) OPTIME
TOPICAL | Status: DC | PRN
  Administered 2021-06-08: 1000 mL

## 2021-06-08 MED ORDER — CALCIUM ACETATE (PHOS BINDER) 667 MG PO CAPS
667.0000 mg | ORAL_CAPSULE | Freq: Three times a day (TID) | ORAL | Status: DC
  Administered 2021-06-09 – 2021-06-13 (×15): 667 mg via ORAL
  Filled 2021-06-08 (×16): qty 1

## 2021-06-08 MED ORDER — EPHEDRINE SULFATE-NACL 50-0.9 MG/10ML-% IV SOSY
PREFILLED_SYRINGE | INTRAVENOUS | Status: DC | PRN
  Administered 2021-06-08: 10 mg via INTRAVENOUS

## 2021-06-08 MED ORDER — PROPOFOL 10 MG/ML IV BOLUS
INTRAVENOUS | Status: DC | PRN
  Administered 2021-06-08: 120 mg via INTRAVENOUS

## 2021-06-08 MED ORDER — HEPARIN SODIUM (PORCINE) 5000 UNIT/ML IJ SOLN
5000.0000 [IU] | Freq: Three times a day (TID) | INTRAMUSCULAR | Status: DC
  Administered 2021-06-09 (×2): 5000 [IU] via SUBCUTANEOUS
  Filled 2021-06-08 (×2): qty 1

## 2021-06-08 MED ORDER — ORAL CARE MOUTH RINSE: 15.0000 mL | Freq: Once | OROMUCOSAL | Status: AC

## 2021-06-08 MED ORDER — LACTATED RINGERS IV SOLN: INTRAVENOUS | Status: DC

## 2021-06-08 MED ORDER — ROPIVACAINE HCL 5 MG/ML IJ SOLN
INTRAMUSCULAR | Status: DC | PRN
  Administered 2021-06-08: 15 mL via PERINEURAL

## 2021-06-08 MED ORDER — POLYETHYLENE GLYCOL 3350 17 G PO PACK
17.0000 g | PACK | Freq: Two times a day (BID) | ORAL | Status: DC
  Administered 2021-06-08 – 2021-06-12 (×5): 17 g via ORAL
  Filled 2021-06-08 (×8): qty 1

## 2021-06-08 MED ORDER — CHLORHEXIDINE GLUCONATE CLOTH 2 % EX PADS: 6.0000 | MEDICATED_PAD | Freq: Once | CUTANEOUS | Status: DC

## 2021-06-08 MED ORDER — SENNA 8.6 MG PO TABS
1.0000 | ORAL_TABLET | Freq: Every day | ORAL | Status: DC
  Administered 2021-06-09 – 2021-06-12 (×2): 8.6 mg via ORAL
  Filled 2021-06-08 (×3): qty 1

## 2021-06-08 MED ORDER — LIDOCAINE 2% (20 MG/ML) 5 ML SYRINGE
INTRAMUSCULAR | Status: DC | PRN
  Administered 2021-06-08: 20 mg via INTRAVENOUS

## 2021-06-08 MED ORDER — BACITRACIN ZINC 500 UNIT/GM EX OINT
TOPICAL_OINTMENT | CUTANEOUS | Status: AC
  Filled 2021-06-08: qty 28.35

## 2021-06-08 MED ORDER — MORPHINE SULFATE (PF) 2 MG/ML IV SOLN: 2.0000 mg | INTRAVENOUS | Status: DC | PRN

## 2021-06-08 MED ORDER — CEFAZOLIN SODIUM-DEXTROSE 1-4 GM/50ML-% IV SOLN
1.0000 g | INTRAVENOUS | Status: AC
Start: 2021-06-09 — End: 2021-06-09
  Administered 2021-06-09: 1 g via INTRAVENOUS
  Filled 2021-06-08: qty 50

## 2021-06-08 MED ORDER — ASPIRIN 81 MG PO CHEW
81.0000 mg | CHEWABLE_TABLET | Freq: Every day | ORAL | Status: DC
  Administered 2021-06-08 – 2021-06-13 (×6): 81 mg via ORAL
  Filled 2021-06-08 (×6): qty 1

## 2021-06-08 MED ORDER — FENTANYL CITRATE (PF) 100 MCG/2ML IJ SOLN
INTRAMUSCULAR | Status: AC
  Administered 2021-06-08: 50 ug via INTRAVENOUS
  Filled 2021-06-08: qty 2

## 2021-06-08 MED ORDER — ASPIRIN 81 MG PO CHEW: 81.0000 mg | CHEWABLE_TABLET | Freq: Every day | ORAL | Status: DC

## 2021-06-08 MED ORDER — SODIUM CHLORIDE 0.9 % IV SOLN: INTRAVENOUS | Status: DC | PRN

## 2021-06-08 MED ORDER — FENTANYL CITRATE (PF) 100 MCG/2ML IJ SOLN: 12.5000 ug | INTRAMUSCULAR | Status: DC | PRN

## 2021-06-08 MED ORDER — ONDANSETRON HCL 4 MG/2ML IJ SOLN
INTRAMUSCULAR | Status: DC | PRN
  Administered 2021-06-08: 4 mg via INTRAVENOUS

## 2021-06-08 MED ORDER — CHLORHEXIDINE GLUCONATE 0.12 % MT SOLN: 15.0000 mL | Freq: Once | OROMUCOSAL | Status: AC

## 2021-06-08 MED ORDER — DEXAMETHASONE SODIUM PHOSPHATE 10 MG/ML IJ SOLN
INTRAMUSCULAR | Status: AC
  Filled 2021-06-08: qty 1

## 2021-06-08 MED ORDER — PHENYLEPHRINE 40 MCG/ML (10ML) SYRINGE FOR IV PUSH (FOR BLOOD PRESSURE SUPPORT)
PREFILLED_SYRINGE | INTRAVENOUS | Status: AC
  Filled 2021-06-08: qty 10

## 2021-06-08 MED ORDER — ADULT MULTIVITAMIN W/MINERALS CH
1.0000 | ORAL_TABLET | Freq: Every day | ORAL | Status: DC
  Administered 2021-06-08 – 2021-06-13 (×6): 1 via ORAL
  Filled 2021-06-08 (×6): qty 1

## 2021-06-08 MED ORDER — PHENYLEPHRINE HCL-NACL 20-0.9 MG/250ML-% IV SOLN
INTRAVENOUS | Status: DC | PRN
  Administered 2021-06-08: 50 ug/min via INTRAVENOUS

## 2021-06-08 MED ORDER — PHENYLEPHRINE HCL (PRESSORS) 10 MG/ML IV SOLN
INTRAVENOUS | Status: AC
  Filled 2021-06-08: qty 2

## 2021-06-08 MED ORDER — BUPIVACAINE HCL (PF) 0.25 % IJ SOLN
INTRAMUSCULAR | Status: DC | PRN
  Administered 2021-06-08: 20 mL

## 2021-06-08 MED ORDER — CHLORHEXIDINE GLUCONATE 0.12 % MT SOLN
OROMUCOSAL | Status: AC
  Administered 2021-06-08: 15 mL via OROMUCOSAL
  Filled 2021-06-08: qty 15

## 2021-06-08 MED ORDER — EPHEDRINE 5 MG/ML INJ
INTRAVENOUS | Status: AC
  Filled 2021-06-08: qty 5

## 2021-06-08 MED ORDER — FENTANYL CITRATE (PF) 100 MCG/2ML IJ SOLN: 50.0000 ug | Freq: Once | INTRAMUSCULAR | Status: AC

## 2021-06-08 MED ORDER — CEFAZOLIN SODIUM-DEXTROSE 2-4 GM/100ML-% IV SOLN
2.0000 g | INTRAVENOUS | Status: AC
  Administered 2021-06-08: 2 g via INTRAVENOUS
  Filled 2021-06-08: qty 100

## 2021-06-08 SURGICAL SUPPLY — 61 items
APL PRP STRL LF DISP 70% ISPRP (MISCELLANEOUS) ×2
BAG COUNTER SPONGE SURGICOUNT (BAG) ×3 IMPLANT
BAG SPNG CNTER NS LX DISP (BAG) ×2
BLADE LONG MED 31X9 (MISCELLANEOUS) IMPLANT
BLADE SAGITTAL (BLADE)
BLADE SAGITTAL 25.0X1.19X90 (BLADE) IMPLANT
BLADE SAW GIGLI 510 (BLADE) ×3 IMPLANT
BLADE SAW THK.89X75X18XSGTL (BLADE) IMPLANT
BLADE SURG 21 STRL SS (BLADE) ×3 IMPLANT
BNDG COHESIVE 6X5 TAN STRL LF (GAUZE/BANDAGES/DRESSINGS) ×3 IMPLANT
BNDG ELASTIC 4X5.8 VLCR STR LF (GAUZE/BANDAGES/DRESSINGS) IMPLANT
BNDG ELASTIC 6X5.8 VLCR STR LF (GAUZE/BANDAGES/DRESSINGS) IMPLANT
BNDG GAUZE ELAST 4 BULKY (GAUZE/BANDAGES/DRESSINGS) IMPLANT
CANISTER SUCT 3000ML PPV (MISCELLANEOUS) ×3 IMPLANT
CANISTER WOUNDNEG PRESSURE 500 (CANNISTER) ×1 IMPLANT
CHLORAPREP W/TINT 26 (MISCELLANEOUS) ×3 IMPLANT
CLIP VESOCCLUDE MED 6/CT (CLIP) IMPLANT
COVER SURGICAL LIGHT HANDLE (MISCELLANEOUS) ×3 IMPLANT
DRAIN CHANNEL 19F RND (DRAIN) IMPLANT
DRAPE DERMATAC (DRAPES) ×3 IMPLANT
DRAPE HALF SHEET 40X57 (DRAPES) ×3 IMPLANT
DRAPE ORTHO SPLIT 77X108 STRL (DRAPES) ×6
DRAPE SURG ORHT 6 SPLT 77X108 (DRAPES) ×4 IMPLANT
DRESSING PREVENA PLUS CUSTOM (GAUZE/BANDAGES/DRESSINGS) ×1 IMPLANT
DRSG PREVENA PLUS CUSTOM (GAUZE/BANDAGES/DRESSINGS) ×3
ELECT REM PT RETURN 9FT ADLT (ELECTROSURGICAL) ×3
ELECTRODE REM PT RTRN 9FT ADLT (ELECTROSURGICAL) ×2 IMPLANT
EVACUATOR SILICONE 100CC (DRAIN) IMPLANT
GAUZE SPONGE 4X4 12PLY STRL (GAUZE/BANDAGES/DRESSINGS) ×3 IMPLANT
GAUZE XEROFORM 5X9 LF (GAUZE/BANDAGES/DRESSINGS) ×1 IMPLANT
GLOVE SURG POLYISO LF SZ8 (GLOVE) ×3 IMPLANT
GOWN STRL REUS W/ TWL LRG LVL3 (GOWN DISPOSABLE) ×4 IMPLANT
GOWN STRL REUS W/ TWL XL LVL3 (GOWN DISPOSABLE) ×2 IMPLANT
GOWN STRL REUS W/TWL LRG LVL3 (GOWN DISPOSABLE) ×6
GOWN STRL REUS W/TWL XL LVL3 (GOWN DISPOSABLE) ×3
KIT BASIN OR (CUSTOM PROCEDURE TRAY) ×3 IMPLANT
KIT TURNOVER KIT B (KITS) ×3 IMPLANT
NS IRRIG 1000ML POUR BTL (IV SOLUTION) ×3 IMPLANT
PACK GENERAL/GYN (CUSTOM PROCEDURE TRAY) ×3 IMPLANT
PAD ARMBOARD 7.5X6 YLW CONV (MISCELLANEOUS) ×6 IMPLANT
PENCIL SMOKE EVACUATOR (MISCELLANEOUS) ×3 IMPLANT
PREVENA RESTOR ARTHOFORM 46X30 (CANNISTER) ×1 IMPLANT
RETRIEVER SUT HEWSON (MISCELLANEOUS) ×1 IMPLANT
STAPLER SKIN 35 REG (STAPLE) ×3 IMPLANT
STAPLER VISISTAT 35W (STAPLE) ×3 IMPLANT
STOCKINETTE IMPERVIOUS LG (DRAPES) ×3 IMPLANT
SUT BONE WAX W31G (SUTURE) IMPLANT
SUT ETHILON 2 0 PSLX (SUTURE) ×6 IMPLANT
SUT ETHILON 3 0 PS 1 (SUTURE) ×6 IMPLANT
SUT FIBERWIRE #5 38 CONV BLUE (SUTURE) ×3
SUT SILK 0 TIES 10X30 (SUTURE) ×3 IMPLANT
SUT SILK 2 0 (SUTURE) ×3
SUT SILK 2 0 SH CR/8 (SUTURE) ×3 IMPLANT
SUT SILK 2-0 18XBRD TIE 12 (SUTURE) ×2 IMPLANT
SUT SILK 3 0 (SUTURE)
SUT SILK 3-0 18XBRD TIE 12 (SUTURE) IMPLANT
SUT VIC AB 2-0 CT1 18 (SUTURE) ×6 IMPLANT
SUTURE FIBERWR #5 38 CONV BLUE (SUTURE) IMPLANT
TOWEL GREEN STERILE (TOWEL DISPOSABLE) ×6 IMPLANT
UNDERPAD 30X36 HEAVY ABSORB (UNDERPADS AND DIAPERS) ×3 IMPLANT
WATER STERILE IRR 1000ML POUR (IV SOLUTION) ×3 IMPLANT

## 2021-06-08 NOTE — Progress Notes (Signed)
Orthopedic Tech Progress Note Patient Details:  Roger Vazquez 10-Jul-1951 [id number] Called order to [facility] Patient ID: Roger Vazquez, male   DOB: [birth year], 70 y.o.   MRN: [id number]  Roger Vazquez 06/08/2021, 5:45 PM

## 2021-06-08 NOTE — Interval H&P Note (Signed)
History and Physical Interval Note:  06/08/2021 2:34 PM  Roger Vazquez  has presented today for surgery, with the diagnosis of GANGRENE LEFT FOOT.  The various methods of treatment have been discussed with the patient and family. After consideration of risks, benefits and other options for treatment, the patient has consented to  Procedure(s): LEFT BELOW KNEE AMPUTATION (Left) as a surgical intervention.  The patient's history has been reviewed, patient examined, no change in status, stable for surgery.  I have reviewed the patient's chart and labs.  Questions were answered to the patient's satisfaction.     Cherre Robins

## 2021-06-08 NOTE — Anesthesia Procedure Notes (Signed)
Anesthesia Regional Block: Adductor canal block   Pre-Anesthetic Checklist: , timeout performed,  Correct Patient, Correct Site, Correct Laterality,  Correct Procedure, Correct Position, site marked,  Risks and benefits discussed,  Surgical consent,  Pre-op evaluation,  At surgeon's request and post-op pain management  Laterality: Left  Prep: chloraprep       Needles:  Injection technique: Single-shot  Needle Type: Echogenic Needle     Needle Length: 9cm  Needle Gauge: 21     Additional Needles:   Procedures:,,,, ultrasound used (permanent image in chart),,    Narrative:  Start time: 06/08/2021 1:57 PM End time: 06/08/2021 2:03 PM Injection made incrementally with aspirations every 5 mL.  Performed by: Personally  Anesthesiologist: Suzette Battiest, MD

## 2021-06-08 NOTE — Anesthesia Procedure Notes (Signed)
Anesthesia Regional Block: Popliteal block   Pre-Anesthetic Checklist: , timeout performed,  Correct Patient, Correct Site, Correct Laterality,  Correct Procedure, Correct Position, site marked,  Risks and benefits discussed,  Surgical consent,  Pre-op evaluation,  At surgeon's request and post-op pain management  Laterality: Left  Prep: chloraprep       Needles:  Injection technique: Single-shot  Needle Type: Echogenic Needle     Needle Length: 9cm  Needle Gauge: 21     Additional Needles:   Procedures:,,,, ultrasound used (permanent image in chart),,    Narrative:  Start time: 06/08/2021 1:50 PM End time: 06/08/2021 1:57 PM Injection made incrementally with aspirations every 5 mL.  Performed by: Personally  Anesthesiologist: Suzette Battiest, MD

## 2021-06-08 NOTE — Anesthesia Preprocedure Evaluation (Signed)
Anesthesia Evaluation  Patient identified by MRN, date of birth, ID band Patient awake    Reviewed: Allergy & Precautions, NPO status , Patient's Chart, lab work & pertinent test results  Airway Mallampati: II  TM Distance: >3 FB     Dental  (+) Dental Advisory Given   Pulmonary former smoker,    breath sounds clear to auscultation       Cardiovascular hypertension,  Rhythm:Regular Rate:Normal     Neuro/Psych negative neurological ROS     GI/Hepatic GERD  ,(+) Hepatitis -, C  Endo/Other  diabetes  Renal/GU ESRF and DialysisRenal disease     Musculoskeletal   Abdominal   Peds  Hematology  (+) anemia ,   Anesthesia Other Findings   Reproductive/Obstetrics                             Anesthesia Physical Anesthesia Plan  ASA: 3  Anesthesia Plan:    Post-op Pain Management:    Induction:   PONV Risk Score and Plan:   Airway Management Planned:   Additional Equipment:   Intra-op Plan:   Post-operative Plan:   Informed Consent:   Plan Discussed with:   Anesthesia Plan Comments:         Anesthesia Quick Evaluation

## 2021-06-08 NOTE — H&P (Addendum)
[facility] Admission History and Physical Service Pager: [phone]  Patient name: Roger Vazquez Medical record number: [id number] Date of birth: 01-05-1951 Age: 70 y.o. Gender: male  Primary Care Provider: [facility] Consultants: Vascular Surgery Code Status: Full Preferred Emergency Contact: Madison Albea [phone]  Chief Complaint: Left foot gangrene, s/p left BKA  Assessment and Plan: Roger Vazquez is a 71 y.o. male presenting with left foot gangrene s/p left BKA . PMH is significant for ESRD on HD MWF, hx of lung CA, PAD, HTN, DM2, polysubstance abuse.  Left foot gangrene s/p left BKA Patient presented on 8/23 to [facility] from SNF for evaluation of left transmetatarsal amputation, that was not healing and was frankly gangrenous.  Due to progression of gangrene of left forefoot s/p left second toe amputation and foot debridement and s/p transmetatarsal amputation, he had nonsalvageable ischemia, with planned left BKA today.  Surgery was uncomplicated and with minimal blood loss. On admission, labs remarkable for elevated WBC 19.9, likely reactive in the setting of recent surgery. Low concern for infection given afebrile and otherwise asymptomatic, though will monitor and trend. Hgb 9.0, Na+ 138, K+ 4.1. Currently on room air, maintaining saturations greater than 92%.  Remains afebrile and hemodynamically stable.  -Admit to med telemetry, attending Dr. Ardelia Mems -Vital signs per floor -Acetaminophen 500 mg q6h  -PO Percocet 5-325 1 to 2 tablets q4 hours PRN for moderate pain -IV Fentanyl 12.5 mcg, q2 hours PRN for severe pain; can increase if needed -IV Cefazolin 2 g every 8 hours for 3 doses -Dysphagia 3 diet, per SLP last admission -CBC in am -PT/OT eval and treat -Vascular surgery following  ESRD on HD Cr. 6.37, BUN 29 on admission. Patient attends dialysis Monday, Wednesday, Friday. -Consult to  nephrology in the morning for HD -Avoid nephrotoxic medications and agents -Will require inpatient hospital dialysis -Continue PhosLo and renal vitamin -RFP in am  T2DM: Chronic, stable A1c 7.9% on 7/14.  No home meds. SSI discontinued last admission due to relatively controlled sugars and decreased PO.  -Monitor CBGs q8 hours -Hold basal and SSI for now  HTN  PAD Chronic, stable. BP 116/52.  Home meds: ASA, Plavix -Continue ASA 81 mg daily -Resume Plavix tomorrow morning -Monitor VS  Chronic constipation Patient with recurrent constipation. -Miralax 17g BID and Senna BID to prevent postoperative ileus/opioid-induced constipation  History of Alcohol Abuse Reported heavy drinking history. Patient was hospitalized from 8/5-8/18 and discharged to SNF. He presented directly from SNF, thus low concern for alcohol withdrawal at this time. -No CIWA  History of Delirium During last hospitalization.  -Delirium precautions -Redirect patient -Could consider Seroquel if re-direction does not work but would not schedule this   FEN/GI: Dysphagia 3 diet as tolerated Prophylaxis: Heparin 5,000 u subQ q 8 hours  Disposition: Med-telemetry  History of Present Illness:  Roger Vazquez is a 71 y.o. male presenting s/p left BKA this morning. Says he has pain "all over" but not localized specifically in his left leg. He denies nausea, vomiting, chest pain, breathing difficulty. He says he is hungry and would like to watch TV.  Review Of Systems: Per HPI with the following additions:   Review of Systems  Constitutional:  Negative for chills and fever.  Respiratory:  Negative for chest tightness and shortness of breath.   Cardiovascular:  Negative for chest pain.  Gastrointestinal:  Negative for abdominal pain, diarrhea, nausea and vomiting.  Neurological:  Negative  for dizziness.  Psychiatric/Behavioral:  Negative for agitation.     Patient Active Problem List   Diagnosis Date Noted    Status post amputation of foot through metatarsal bone ([place])    Altered mental status 05/19/2021   Left foot infection    Alcohol abuse 05/16/2021   Erectile dysfunction 05/16/2021   Hydronephrosis 05/16/2021   Acute hepatitis C 05/16/2021   Diabetic retinopathy ([place]) 05/16/2021   History of hemodialysis 05/16/2021   Iron deficiency anemia 05/16/2021   Malignant neoplasm of upper lobe, left bronchus or lung ([place]) 05/16/2021   Noncompliance with medication regimen 05/16/2021   Non-small cell lung cancer ([place]) 05/16/2021   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   Vitreous hemorrhage, left eye ([place]) 05/16/2021   Cellulitis of left lower extremity    Gangrene ([place]) 04/27/2021   Diarrhea, unspecified 04/29/2019   Mild protein-calorie malnutrition ([place]) 08/20/2018   Fever    Infection of AV graft for dialysis ([place]) 08/05/2018   Symptomatic anemia 08/05/2018   Hypokalemia 08/05/2018   GERD (gastroesophageal reflux disease) 08/05/2018   Sepsis due to unspecified Staphylococcus ([place]) 08/02/2018   ESRD on hemodialysis ([place])    Bacteremia due to methicillin susceptible Staphylococcus aureus (MSSA) 07/28/2018   Sepsis ([employer]) 07/27/2018   Headache 01/14/2017   Fluid overload, unspecified 12/13/2015   Anemia in chronic kidney disease 12/09/2015   Chronic kidney disease, stage 5 ([place]) 12/09/2015   Coagulation defect, unspecified ([place]) 12/09/2015   Encounter for adjustment and management of vascular access device 12/09/2015   Other secondary pulmonary hypertension ([place]) 12/09/2015   Pruritus, unspecified 12/09/2015   Secondary hyperparathyroidism of renal origin ([place]) 12/09/2015   Shortness of breath 12/09/2015   Abdominal pain, other specified site 10/07/2011   Nausea and vomiting 10/07/2011   VITAMIN D DEFICIENCY 03/19/2008   FRACTURE, TIBIA 02/09/2008   HTN (hypertension) 11/25/2007   HEPATITIS C 05/27/2007   Type 2 diabetes mellitus with chronic kidney disease on chronic dialysis  ([place]) 05/27/2007   ERECTILE DYSFUNCTION 05/27/2007   ABUSE, ALCOHOL, IN REMISSION 05/27/2007   ABUSE, OPIOID, IN REMISSION 05/27/2007   ABUSE, COCAINE, IN REMISSION 05/27/2007    Past Medical History: Past Medical History:  Diagnosis Date   Anemia    Blood dyscrasia    history of coagulation deficit per [facility] L[facility] nursing home   Cancer [facility])    Lung   Diabetes mellitus    Hepatitis    Hypertension    Renal disorder    MWF- [facility]    Past Surgical History: Past Surgical History:  Procedure Laterality Date   ABDOMINAL AORTOGRAM W/LOWER EXTREMITY Left 04/28/2021   Procedure: ABDOMINAL AORTOGRAM W/LOWER EXTREMITY;  Surgeon: Cherre Robins, MD;  Location: [place] CV LAB;  Service: Cardiovascular;  Laterality: Left;   AMPUTATION Left 05/04/2021   Procedure: LEFT SECOND TOE AMPUTATION;  Surgeon: Cherre Robins, MD;  Location: [place];  Service: Vascular;  Laterality: Left;   artiovenous graft placement Left    AV FISTULA PLACEMENT Right 11/27/2018   Procedure: INSERTION 4-7MM X 45CM GORETEX GRAFT RIGHT FOREARM ;  Surgeon: Rosetta Posner, MD;  Location: [place];  Service: Vascular;  Laterality: Right;   [place] REMOVAL Left 08/06/2018   Procedure: REMOVAL OF LEFT ARM GRAFT;  Surgeon: Rosetta Posner, MD;  Location: [place];  Service: Vascular;  Laterality: Left;   INSERTION OF DIALYSIS CATHETER Right 08/08/2018   Procedure: INSERTION OF TUNNELED  DIALYSIS CATHETER;  Surgeon: Marty Heck, MD;  [facility] OR;  Service: Vascular;  Laterality: Right;   IR DIALY SHUNT INTRO NEEDLE/INTRACATH INITIAL W/IMG RIGHT Right 05/26/2021   IR REMOVAL TUN CV CATH W/O FL  02/19/2019   IR US GUIDE VASC ACCESS RIGHT  05/26/2021   LUNG SURGERY     patient not if they removed part of lung. Was done at [facility] Left 04/28/2021   Procedure: PERIPHERAL VASCULAR BALLOON ANGIOPLASTY;  Surgeon: Cherre Robins, MD;  Location: [place] CV LAB;   Service: Cardiovascular;  Laterality: Left;  popliteal and posterial tibial   TEE WITHOUT CARDIOVERSION N/A 07/31/2018   Procedure: TRANSESOPHAGEAL ECHOCARDIOGRAM (TEE);  Surgeon: Pixie Casino, MD;  Location[facility] ENDOSCOPY;  Service: Cardiovascular;  Laterality: N/A;   TRANSMETATARSAL AMPUTATION Left 05/22/2021   Procedure: LEFT TRANSMETATARSAL AMPUTATION;  Surgeon: Elam Dutch, MD;  Location[facility] OR;  Service: Vascular;  Laterality: Left;    Social History: Social History   Tobacco Use   Smoking status: Former    Packs/day: 0.50    Years: 10.00    Pack years: 5.00    Types: Cigarettes    Quit date: 2001    Years since quitting: 21.6   Smokeless tobacco: Former    Quit date: 08/01/2015  Vaping Use   Vaping Use: Never used  Substance Use Topics   Alcohol use: Yes    Alcohol/week: 2.0 standard drinks    Types: 2 Cans of beer per week   Drug use: Yes    Types: Marijuana    Comment: last time 11/19/2018     Family History: History reviewed. No pertinent family history.  Allergies and Medications: Allergies  Allergen Reactions   Sildenafil Other (See Comments)    Pt stated, "Makes my Blood Pressure drop"   No current facility-administered medications on file prior to encounter.   Current Outpatient Medications on File Prior to Encounter  Medication Sig Dispense Refill   acetaminophen (TYLENOL) 500 MG tablet Take 1 tablet (500 mg total) by mouth every 6 (six) hours. 30 tablet 0   aspirin 81 MG chewable tablet Chew 81 mg by mouth daily. (0800)     calcium acetate (PHOSLO) 667 MG capsule Take 667 mg by mouth 3 (three) times daily with meals. (0800, 1300 & 1700)     clopidogrel (PLAVIX) 75 MG tablet Take 1 tablet (75 mg total) by mouth daily. 30 tablet 0   Multiple Vitamin (MULITIVITAMIN WITH MINERALS) TABS Take 1 tablet by mouth daily. 30 tablet 0   polyethylene glycol (MIRALAX / GLYCOLAX) 17 g packet Take 17 g by mouth 2 (two) times daily. 14 each 0   senna (SENOKOT)  8.6 MG TABS tablet Take 1 tablet (8.6 mg total) by mouth daily. 7 tablet 0   lidocaine-prilocaine (EMLA) cream Apply 1 application topically as needed.      Objective: BP (!) 144/63   Pulse 99   Temp 98.8 F (37.1 C)   Resp 20   Ht 5\' 6"  (1.676 m)   Wt 56.7 kg   SpO2 99%   BMI 20.18 kg/m  Exam: General: Elderly gentleman, comfortable and resting in bed Eyes: PERRL ENTM: Mucus membranes dry Neck: Supple Cardiovascular: RRR Respiratory: CTAB, normal work of breathing  Gastrointestinal: Soft, nontender, nondistended MSK: BKA on left with stump protector. Right leg with palpale dorsalis pedis and tibialis pusles but weak, warm. Derm: Skin warm, dry, well-perfused Neuro: Alert and oriented to self, year, reasoning for hospitalization. Speech clear and fluent. Psych:  Normal mood and affect.  Labs and Imaging: CBC BMET  Recent Labs  Lab 06/08/21 1051  WBC 19.9*  HGB 9.0*  HCT 28.4*  PLT 480*   Recent Labs  Lab 06/08/21 1051  NA 138  K 4.1  CL 92*  CO2 26  BUN 29*  CREATININE 6.37*  GLUCOSE 161*  CALCIUM 9.3     EKG: pending  Orvis Brill, DO 06/08/2021, 4:53 PM PGY-1, [place] Intern pager: [phone], text pages welcome  FPTS Upper-Level Resident Addendum   I have independently interviewed and examined the patient. I have discussed the above with the original author and agree with their documentation. My edits for correction/addition/clarification are included where appropriate. Please see also any attending notes.   Sharion Settler, DO PGY-2, [facility] 06/08/2021 7:09 PM  FPTS Service pager: [phone] (text pages welcome through [place])

## 2021-06-08 NOTE — Progress Notes (Signed)
FPTS Brief Progress Note  S: Patient is POD#0 from left BKA.  Reports that he is in pain all over.  Has no other complaints at this time.    O: BP 130/63 (BP Location: Left Arm)   Pulse 91   Temp 98.2 F (36.8 C) (Oral)   Resp 18   Ht 5\' 6"  (1.676 m)   Wt 56.7 kg   SpO2 100%   BMI 20.18 kg/m   General: Patient is resting in bed and in no acute distress MSK: BKA on left with stump protector.   A/P: Plan per day team -IV cefazolin 2 g every 8 hours for 3 doses  -Continue with ASA and Plavix in the morning -Consult nephro in the morning for hemodialysis tomorrow -A.M. CBC and RFP - Orders reviewed. Labs for AM ordered, which was adjusted as needed.  -Pain control: IV fentanyl 12.5 mcg every 2 hours as needed for severe pain, will add on Tylenol as needed as well -Resume Plavix in the morning -Patient has history of delirium.  Not currently delirious, should be redirectable.  Can use Seroquel if needed.   [name], MD 06/08/2021, 8:14 PM PGY-1, [facility] Night Resident  Please page [phone] with questions.

## 2021-06-08 NOTE — Op Note (Signed)
DATE OF SERVICE: 06/08/2021  PATIENT:  Roger Vazquez  70 y.o. 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  POST-OPERATIVE DIAGNOSIS:  Same  PROCEDURE:   Left below knee amputation  SURGEON:  Surgeon(s) and Role:    * Cherre Robins, MD - Primary  ASSISTANT: Arlee Muslim, PA-C  An assistant was required to facilitate exposure and expedite the case.  ANESTHESIA:   general  EBL: 17mL  BLOOD ADMINISTERED:none  DRAINS: none   LOCAL MEDICATIONS USED:  NONE  SPECIMEN:  residual left limb  COUNTS: confirmed correct.  TOURNIQUET:    Total Tourniquet Time Documented: Thigh (Left) - 6 minutes Total: Thigh (Left) - 6 minutes  PATIENT DISPOSITION:  PACU - hemodynamically stable.   Delay start of Pharmacological VTE agent (>24hrs) due to surgical blood loss or risk of bleeding: no  INDICATION FOR PROCEDURE: Roger Vazquez is a 70 y.o. male with atherosclerosis of native arteries of left lower extremity. He is maximally revascularized. He has not healed toe amputation or transmetatarsal amputation.  After careful discussion of risks, benefits, and alternatives the patient was offered left below knee amputation. The patient's family understood and wished to proceed.  OPERATIVE FINDINGS: Unremarkable below-knee amputation.  Healthy tissue at amputation margins.  Myodesis performed to anterior aspect of tibia with gastrocnemius aponeurosis.  Duda protocol dressing applied.  DESCRIPTION OF PROCEDURE: After identification of the patient in the pre-operative holding area, the patient was transferred to the operating room. The patient was positioned supine on the operating room table. Anesthesia was induced. The left leg was prepped and draped in standard fashion. A surgical pause was performed confirming correct patient, procedure, and operative location.  A sterile  tourniquet was placed on the left thigh. The skin of the leg was marked to plan the anterior incision 10 cm distal to the tibial tuberosity and then the marked out a posterior flap that was one third of the circumference of the calf in length.   I then exsanguinated the leg with a Esmarch bandage and then inflated the pneumatic tourniquet to 250 mm Hg. I made the incisions for these flaps, and then dissected through the subcutaneous tissue, fascia, and muscle anteriorly with electrocautery.  I also similarly developed a thick posterior flap of muscle with electrocautery.  I elevated  the periosteal tissue superiorly so that the tibia was about 3 cm shorter than the anterior skin flap.  I then transected the tibia with a power saw and then took a wedge off the tibia anteriorly with the power saw.  Then I smoothed out the rough edges with a rasp.  In a similar fashion, I cut back the fibula about two centimeters higher than the level of the tibia with a bone cutter.  I then finished releasing the posterior muscle flap with an amputation knife.  At this point, the specimen was passed off the field as the below-the-knee amputation.  At this point, I clamped all visible arteries and veins.  These were ligated using silk suture.  The tourniquet was deflated.  Hemostasis was achieved in the flaps using electrocautery and silk suture.  The stump was washed off with sterile normal saline and no further active bleeding was noted.    A myodesis was created using a drill bit to create a small hole through the anterior cortex of the tibia.  A #5 FiberWire was delivered through the osteotomy and through  the aponeurosis of the gastrocnemius.  I reapproximated the anterior and posterior fascia  with interrupted stitches of 2-0 Vicryl.  This was completed along the entire length of anterior and posterior fascia until there was no more loose space in the fascial line.  The skin was then reapproximated with staples.  The stump  was washed off and dried.  A Prevena VAC system was applied to the stump.  This was secured with derma tack and Ioban.  A good seal was achieved.  A stump shrinker was applied.  A stump protector was applied.  Upon completion of the case instrument and sharps counts were confirmed correct. The patient was transferred to the PACU in good condition. I was present for all portions of the procedure.  [name]. Stanford Breed, MD [facility] Phone Number: [phone] 06/08/2021 4:29 PM

## 2021-06-08 NOTE — Anesthesia Postprocedure Evaluation (Signed)
Anesthesia Post Note  Patient: Roger Vazquez  Procedure(s) Performed: LEFT BELOW KNEE AMPUTATION (Left: Knee) APPLICATION OF WOUND VAC (Left: Leg Lower)     Patient location during evaluation: PACU Anesthesia Type: General Level of consciousness: awake and alert Pain management: pain level controlled Vital Signs Assessment: post-procedure vital signs reviewed and stable Respiratory status: spontaneous breathing, nonlabored ventilation, respiratory function stable and patient connected to nasal cannula oxygen Cardiovascular status: blood pressure returned to baseline and stable Postop Assessment: no apparent nausea or vomiting Anesthetic complications: no   No notable events documented.  Last Vitals:  Vitals:   06/08/21 1709 06/08/21 1724  BP: (!) 116/52   Pulse: 94 95  Resp: 20 17  Temp:    SpO2: 100% 100%    Last Pain:  Vitals:   06/08/21 1709  TempSrc:   PainSc: 0-No pain                 Tiajuana Amass

## 2021-06-08 NOTE — Anesthesia Procedure Notes (Signed)
Procedure Name: LMA Insertion Date/Time: 06/08/2021 3:11 PM Performed by: Renato Shin, CRNA Pre-anesthesia Checklist: Patient identified, Suction available, Emergency Drugs available and Patient being monitored Patient Re-evaluated:Patient Re-evaluated prior to induction Oxygen Delivery Method: Circle system utilized Preoxygenation: Pre-oxygenation with 100% oxygen Induction Type: IV induction LMA: LMA inserted LMA Size: 4.0 Placement Confirmation: positive ETCO2 and breath sounds checked- equal and bilateral Tube secured with: Tape Dental Injury: Teeth and Oropharynx as per pre-operative assessment

## 2021-06-08 NOTE — Transfer of Care (Signed)
Immediate Anesthesia Transfer of Care Note  Patient: Roger Vazquez  Procedure(s) Performed: LEFT BELOW KNEE AMPUTATION (Left: Knee) APPLICATION OF WOUND VAC (Left: Leg Lower)  Patient Location: PACU  Anesthesia Type:General  Level of Consciousness: awake, drowsy and patient cooperative  Airway & Oxygen Therapy: Patient Spontanous Breathing  Post-op Assessment: Report given to RN and Post -op Vital signs reviewed and stable  Post vital signs: Reviewed and stable  Last Vitals:  Vitals Value Taken Time  BP 134/65 06/08/21 1625  Temp 37.1 C 06/08/21 1624  Pulse 99 06/08/21 1624  Resp 20 06/08/21 1627  SpO2 98 % 06/08/21 1624  Vitals shown include unvalidated device data.  Last Pain:  Vitals:   06/08/21 1624  TempSrc:   PainSc: Asleep      Patients Stated Pain Goal: 0 (57/01/77 9390)  Complications: No notable events documented.

## 2021-06-08 NOTE — Progress Notes (Signed)
Patient arrived this evening at approximately 1815.  Patient VS stable at this time, denies pain. Understands the use of the call light. Bed in low position. Will endorse new admit to on coming nurse

## 2021-06-09 ENCOUNTER — Encounter (HOSPITAL_COMMUNITY): Payer: Self-pay | Admitting: Vascular Surgery

## 2021-06-09 DIAGNOSIS — Z89512 Acquired absence of left leg below knee: Secondary | ICD-10-CM

## 2021-06-09 DIAGNOSIS — I739 Peripheral vascular disease, unspecified: Secondary | ICD-10-CM

## 2021-06-09 LAB — RENAL FUNCTION PANEL
Albumin: 2.4 g/dL — ABNORMAL LOW (ref 3.5–5.0)
Anion gap: 19 — ABNORMAL HIGH (ref 5–15)
BUN: 44 mg/dL — ABNORMAL HIGH (ref 8–23)
CO2: 24 mmol/L (ref 22–32)
Calcium: 9 mg/dL (ref 8.9–10.3)
Chloride: 93 mmol/L — ABNORMAL LOW (ref 98–111)
Creatinine, Ser: 7.51 mg/dL — ABNORMAL HIGH (ref 0.61–1.24)
GFR, Estimated: 7 mL/min — ABNORMAL LOW (ref >60)
Glucose, Bld: 265 mg/dL — ABNORMAL HIGH (ref 70–99)
Phosphorus: 7.8 mg/dL — ABNORMAL HIGH (ref 2.5–4.6)
Potassium: 5.1 mmol/L (ref 3.5–5.1)
Sodium: 136 mmol/L (ref 135–145)

## 2021-06-09 LAB — CBC
HCT: 22.1 % — ABNORMAL LOW (ref 39.0–52.0)
Hemoglobin: 7 g/dL — ABNORMAL LOW (ref 13.0–17.0)
MCH: 32.9 pg (ref 26.0–34.0)
MCHC: 31.7 g/dL (ref 30.0–36.0)
MCV: 103.8 fL — ABNORMAL HIGH (ref 80.0–100.0)
Platelets: 405 10*3/uL — ABNORMAL HIGH (ref 150–400)
RBC: 2.13 MIL/uL — ABNORMAL LOW (ref 4.22–5.81)
RDW: 15.9 % — ABNORMAL HIGH (ref 11.5–15.5)
WBC: 15.9 10*3/uL — ABNORMAL HIGH (ref 4.0–10.5)
nRBC: 0 % (ref 0.0–0.2)

## 2021-06-09 LAB — HEMOGLOBIN AND HEMATOCRIT, BLOOD
HCT: 19.4 % — ABNORMAL LOW (ref 39.0–52.0)
Hemoglobin: 6.7 g/dL — CL (ref 13.0–17.0)

## 2021-06-09 LAB — PREPARE RBC (CROSSMATCH)

## 2021-06-09 LAB — GLUCOSE, CAPILLARY: Glucose-Capillary: 397 mg/dL — ABNORMAL HIGH (ref 70–99)

## 2021-06-09 MED ORDER — OXYCODONE-ACETAMINOPHEN 5-325 MG PO TABS: 1.0000 | ORAL_TABLET | Freq: Four times a day (QID) | ORAL | Status: DC | PRN

## 2021-06-09 MED ORDER — OXYCODONE HCL 5 MG PO TABS
5.0000 mg | ORAL_TABLET | Freq: Four times a day (QID) | ORAL | Status: DC | PRN
Start: 2021-06-09 — End: 2021-06-13
  Administered 2021-06-09 – 2021-06-13 (×10): 5 mg via ORAL
  Filled 2021-06-09 (×10): qty 1

## 2021-06-09 MED ORDER — CHLORHEXIDINE GLUCONATE CLOTH 2 % EX PADS
6.0000 | MEDICATED_PAD | Freq: Every day | CUTANEOUS | Status: DC
  Administered 2021-06-09 – 2021-06-13 (×4): 6 via TOPICAL

## 2021-06-09 MED ORDER — INSULIN ASPART 100 UNIT/ML IJ SOLN: 0.0000 [IU] | Freq: Three times a day (TID) | INTRAMUSCULAR | Status: DC

## 2021-06-09 MED ORDER — INSULIN ASPART 100 UNIT/ML IJ SOLN
0.0000 [IU] | Freq: Three times a day (TID) | INTRAMUSCULAR | Status: DC
  Administered 2021-06-09: 9 [IU] via SUBCUTANEOUS
  Administered 2021-06-10 – 2021-06-11 (×3): 2 [IU] via SUBCUTANEOUS
  Administered 2021-06-11: 3 [IU] via SUBCUTANEOUS
  Administered 2021-06-12 (×2): 2 [IU] via SUBCUTANEOUS
  Administered 2021-06-13: 3 [IU] via SUBCUTANEOUS
  Administered 2021-06-13: 1 [IU] via SUBCUTANEOUS

## 2021-06-09 MED ORDER — DARBEPOETIN ALFA 150 MCG/0.3ML IJ SOSY
150.0000 ug | PREFILLED_SYRINGE | Freq: Once | INTRAMUSCULAR | Status: AC
  Administered 2021-06-09: 150 ug via SUBCUTANEOUS
  Filled 2021-06-09: qty 0.3

## 2021-06-09 MED ORDER — SODIUM CHLORIDE 0.9% IV SOLUTION: Freq: Once | INTRAVENOUS | Status: AC

## 2021-06-09 MED ORDER — OXYCODONE-ACETAMINOPHEN 5-325 MG PO TABS: 1.0000 | ORAL_TABLET | ORAL | Status: DC | PRN

## 2021-06-09 NOTE — Progress Notes (Signed)
Tech notified this RN that patient CBG reading 350s. Paged family medicine to update; awaiting call back/ orders at this time.

## 2021-06-09 NOTE — Evaluation (Signed)
Occupational Therapy Evaluation Patient Details Name: Roger Vazquez MRN: [id number] DOB: 1951/07/01 Today's Date: 06/09/2021    History of Present Illness Pt is a 70 y.o. 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 PMH is significant for ESRD on HD MWF, hx of lung CA, PAD, HTN, DM2, polysubstance abuse.   Clinical Impression   Pt admitted for procedure listed above. PTA pt was at a SNF, receiving therapy for prior transmetatarsal amputation. At this time, pt presents with increased weakness, low activity tolerance, and cognitive deficits. Pt requiring max A +2 for all OOB mobility and LB ADL's. Pt continues to need SNF level therapies to maximize his recovery and progress. OT will follow acutely.     Follow Up Recommendations  SNF    Equipment Recommendations  None recommended by OT    Recommendations for Other Services       Precautions / Restrictions Precautions Precautions: Fall Precaution Comments: L BKA, wound vac Restrictions Weight Bearing Restrictions: Yes LLE Weight Bearing: Non weight bearing      Mobility Bed Mobility Overal bed mobility: Needs Assistance Bed Mobility: Supine to Sit     Supine to sit: Mod assist;HOB elevated     General bed mobility comments: Cues to sequence legs off L EOB, slow but good initiation by pt. ModA using bed pad to scoot hips to EOB with cues for pt to hold and pull on grab bars to ascend trunk.    Transfers Overall transfer level: Needs assistance Equipment used: 2 person hand held assist Transfers: Squat Pivot Transfers     Squat pivot transfers: Max assist;+2 physical assistance;+2 safety/equipment     General transfer comment: R knee block provided with bil UE support, cuing pt to place R foot flat on ground to prepare for transfer and to extend knee, min  initiation noted with extending knee. MaxAx2 to squat pivot to R bed > recliner.    Balance Overall balance assessment: Needs assistance Sitting-balance support: Bilateral upper extremity supported;Feet supported Sitting balance-Leahy Scale: Poor Sitting balance - Comments: UE support and min guard-minA to sit statically EOB.   Standing balance support: Bilateral upper extremity supported Standing balance-Leahy Scale: Zero Standing balance comment: Bil UE support and maxAx2 to come to partial stand.                           ADL either performed or assessed with clinical judgement   ADL Overall ADL's : Needs assistance/impaired Eating/Feeding: Minimal assistance;Sitting   Grooming: Minimal assistance;Sitting   Upper Body Bathing: Moderate assistance;Sitting   Lower Body Bathing: Maximal assistance;+2 for physical assistance;+2 for safety/equipment;Bed level   Upper Body Dressing : Minimal assistance;Sitting   Lower Body Dressing: Maximal assistance;+2 for physical assistance;+2 for safety/equipment;Bed level   Toilet Transfer: Maximal assistance;+2 for physical assistance;+2 for safety/equipment;Squat-pivot   Toileting- Clothing Manipulation and Hygiene: Maximal assistance;+2 for physical assistance;+2 for safety/equipment;Bed level   Tub/ Shower Transfer: Maximal assistance;+2 for physical assistance;+2 for safety/equipment;Squat-pivot     General ADL Comments: Pt requiring max A +2 for all OOB mobility/trasnfers and LB ADL's.     Vision Baseline Vision/History:  (Unknown, as pt reports seeing his mom sit on the couch, despite it being his sister.) Ability to See in Adequate Light: 3 Highly impaired Patient Visual Report: Other (comment) (Unsure) Vision Assessment?: Vision  impaired- to be further tested in functional context     Perception Perception Perception Tested?: No   Praxis Praxis Praxis tested?: Not tested    Pertinent Vitals/Pain Pain  Assessment: Faces Faces Pain Scale: Hurts little more Pain Location: L leg s/p BKA, "all over" Pain Descriptors / Indicators: Discomfort;Grimacing;Guarding;Operative site guarding Pain Intervention(s): Limited activity within patient's tolerance;Monitored during session;Repositioned;Premedicated before session     Hand Dominance     Extremity/Trunk Assessment Upper Extremity Assessment Upper Extremity Assessment: Generalized weakness;RUE deficits/detail RUE Deficits / Details: edema observed at at least forearm and distal   Lower Extremity Assessment Lower Extremity Assessment: Generalized weakness;LLE deficits/detail RLE Deficits / Details: generalized weakness with difficulty initiating quads to extend knee to stand with transfer LLE Deficits / Details: S/p BKA limiting strength and ROM   Cervical / Trunk Assessment Cervical / Trunk Assessment: Normal   Communication Communication Communication: No difficulties   Cognition Arousal/Alertness: Awake/alert Behavior During Therapy: Flat affect Overall Cognitive Status: Impaired/Different from baseline Area of Impairment: Attention;Memory;Following commands;Safety/judgement;Awareness;Problem solving                   Current Attention Level: Sustained Memory: Decreased recall of precautions;Decreased short-term memory Following Commands: Follows one step commands with increased time Safety/Judgement: Decreased awareness of safety;Decreased awareness of deficits Awareness: Intellectual Problem Solving: Slow processing;Decreased initiation;Difficulty sequencing;Requires verbal cues;Requires tactile cues General Comments: Pt with flat affect, needing increased time to process simple single-step comamnds to sequence tasks. Pt with poor awareness into his deficits and safety. Pt repeatedly asking family about peopel who they told him had been dead for a while, including pt asking his sister if she was his mother. Sister was far  across the room and reports she does look like their deceased mother though.   General Comments  VSS on RA    Exercises     Shoulder Instructions      Home Living Family/patient expects to be discharged to:: Skilled nursing facility                                 Additional Comments: Pt from [facility], where he was in rehab from prior hospital stay.      Prior Functioning/Environment Level of Independence: Needs assistance  Gait / Transfers Assistance Needed: Pt recently at SNF following transmet amputation and assumingly was working with therapy at SNF prior to this admission ADL's / Homemaking Assistance Needed: Likely needed assistance for all ADLs at SNF prior to this admission.   Comments: From chart review, prior to amputations: pt reports he was independent at home, driving, sister nearby and can assist as needed        OT Problem List: Decreased activity tolerance;Decreased strength;Impaired balance (sitting and/or standing);Decreased coordination;Decreased cognition;Decreased safety awareness;Decreased knowledge of use of DME or AE;Impaired sensation;Pain      OT Treatment/Interventions: Self-care/ADL training;Therapeutic exercise;Energy conservation;DME and/or AE instruction;Therapeutic activities;Cognitive remediation/compensation;Patient/family education;Balance training    OT Goals(Current goals can be found in the care plan section) Acute Rehab OT Goals Patient Stated Goal: to go back to SNF OT Goal Formulation: With patient Time For Goal Achievement: 06/23/21 Potential to Achieve Goals: Fair ADL Goals Pt Will Perform Lower Body Bathing: with min assist;sitting/lateral leans Pt Will Perform Lower Body Dressing: with min assist;sitting/lateral leans Pt Will Transfer to Toilet: with min assist;with transfer board Pt Will Perform Toileting - Clothing Manipulation and hygiene: with min assist;sitting/lateral leans Additional ADL Goal #1:  Pt will  complete bed mobility with min guard assist.  OT Frequency: Min 2X/week   Barriers to D/C:            Co-evaluation PT/OT/SLP Co-Evaluation/Treatment: Yes Reason for Co-Treatment: For patient/therapist safety;To address functional/ADL transfers PT goals addressed during session: Mobility/safety with mobility;Balance OT goals addressed during session: ADL's and self-care;Strengthening/ROM      AM-PAC OT "6 Clicks" Daily Activity     Outcome Measure Help from another person eating meals?: A Little Help from another person taking care of personal grooming?: A Little Help from another person toileting, which includes using toliet, bedpan, or urinal?: A Lot Help from another person bathing (including washing, rinsing, drying)?: A Lot Help from another person to put on and taking off regular upper body clothing?: A Little Help from another person to put on and taking off regular lower body clothing?: A Lot 6 Click Score: 15   End of Session Equipment Utilized During Treatment: Gait belt Nurse Communication: Mobility status;Need for lift equipment  Activity Tolerance: Patient limited by fatigue;Patient limited by lethargy Patient left: in chair;with call bell/phone within reach;with chair alarm set;with family/visitor present  OT Visit Diagnosis: Unsteadiness on feet (R26.81);Other abnormalities of gait and mobility (R26.89);Muscle weakness (generalized) (M62.81);Pain Pain - Right/Left: Left Pain - part of body: Leg                Time: 6834-1962 OT Time Calculation (min): 21 min Charges:  OT General Charges $OT Visit: 1 Visit OT Evaluation $OT Eval Moderate Complexity: [place]., OTR/L Acute Rehabilitation  Marlee Armenteros Elane Harlo Fabela 06/09/2021, 2:30 PM

## 2021-06-09 NOTE — Progress Notes (Addendum)
Family Medicine Teaching Service Daily Progress Note Intern Pager: [phone]  Patient name: Roger Vazquez Medical record number: [id number] Date of birth: [birth year] Age: 70 y.o. Gender: male  Primary Care Provider: [facility] Consultants: Vascular surgery Code Status: Full  Pt Overview and Major Events to Date:  Admitted 8/25-s/p left BKA  Assessment and Plan: Ikey Omary is a 70 year old male presenting with left foot gangrene s/p left BKA.  PMH is significant for ESRD on HD MWF, history of lung cancer, PAD, hypertension, type 2 diabetes mellitus, polysubstance abuse.  Left foot gangrene s/p left BKA POD #1.  Patient hemodynamically stable and afebrile.  Was febrile on admission to 100.5  on 8/25 at 1047. Have a low threshold for obtaining blood cultures, CXR and starting antibiotics should he re-fever. Patient says he has "pain everywhere" but cannot localize pain.  Completed 1 dose of cefazolin postop.  WBC 15.9 this morning, down from 19.9 yesterday.  Hemoglobin 7.0, down from 9.0 yesterday. No signs of bleeding. Patient poor historian so unable to determine if he is symptomatic from anemia. - Vital signs per floor - Monitor for fever -Tylenol 500 mg every 6 hours - P.o.Oxy 5mg  to 2 tablets every q6 hours - Will hold Plavix if Hgb remains low -EKG  hours as needed for moderate to severe pain -Dysphagia 3 diet, per SLP last admission -PT/OT eval and treat- may need higher level of care than SNF considering surgery -Vascular surgery following -H&H this afternoon to re-evaluate low Hgb, will coordinate with neprho regarding transfusion vs trending hgb   ESRD on HD MWF Creatinine 7.51, increased from 6.37 on admission yesterday.  BUN 44.  Consulted nephrology this morning for hemodialysis while inpatient. -Nephrology following appreciate recommendations. Has patent AV fistula. -Continue PhosLo and renal vitamin -RFP in a.m. -Avoid nephrotoxic medications and  agents  2 DM chronic, stable -CBGs within normal meds.  No SSI required last admission, due to relatively well-controlled sugars and decreased p.o. -Monitor CBGs every 8 hours  Chronic constipation Patient is yet to have a bowel movement -Continue MiraLAX and senna twice daily  History of alcohol abuse Low concern for withdrawal at this time. -No CIWA  History of delirium During last well hospitalization -Delirium precautions -Redirect patient -Consider Seroquel if redirection does not work, but will not schedule this at this time.  FEN/GI: Dysphagia 3 diet as tolerated PPx: Heparin 5000 units subcu every 8 hours Dispo:SNF pending clinical improvement . Barriers include pain control, p.o. intake, and Hgb stable  Subjective:  Pt not talkative this morning. Says he has "pain everywhere" but cannot localize pain. No nausea, vomiting. Resting comfortably in bed watching TV. No complaints.  Objective: Temp:  [97.7 F (36.5 C)-100.5 F (38.1 C)] 97.7 F (36.5 C) (08/26 0755) Pulse Rate:  [86-104] 86 (08/26 0755) Resp:  [13-27] 16 (08/26 0755) BP: (106-204)/(52-74) 144/64 (08/26 0755) SpO2:  [98 %-100 %] 100 % (08/26 0755) Weight:  [56.7 kg] 56.7 kg (08/25 1047) Physical Exam: General: Elderly gentleman, comfortable and resting in bed Cardiovascular: Regular rate and rhythm Respiratory: Lungs clear to auscultation bilaterally, no increased work of breathing Abdomen: Soft, nontender, nondistended Extremities: BKA on left with stump protector.  Labeled right leg with SCDs on and palpable dorsalis pedis and tibialis pulses, warm. Neuro: Alert and oriented. Psych: Flat affect.  Laboratory: Recent Labs  Lab 06/08/21 1051 06/09/21 0336  WBC 19.9* 15.9*  HGB 9.0* 7.0*  HCT 28.4* 22.1*  PLT 480* 405*   Recent Labs  Lab 06/08/21 1051 06/09/21 0336  NA 138 136  K 4.1 5.1  CL 92* 93*  CO2 26 24  BUN 29* 44*  CREATININE 6.37* 7.51*  CALCIUM 9.3 9.0  PROT 8.7*  --    BILITOT 1.3*  --   ALKPHOS 91  --   ALT 19  --   AST 36  --   GLUCOSE 161* 265*     Imaging/Diagnostic Tests: No results found.   Orvis Brill, DO 06/09/2021, 9:41 AM PGY-1, [place] Intern pager: [phone], text pages welcome

## 2021-06-09 NOTE — TOC Progression Note (Signed)
Transition of Care [facility]) - Progression Note    Patient Details  Name: Roger Vazquez MRN: [id number] Date of Birth: [birth year]  Transition of [facility]) CM/SW Contact  Milinda Antis, [place] Phone Number: 06/09/2021, 4:00 PM  Clinical Narrative:     CSW searched for the patient in the system and saw that he was previously at [facility] SNF.  CSW contacted [facility] to inquire about what would be needed for the patient to return and there was no answer.  CSW left a VM requesting a returned call.       Expected Discharge Plan and Services                                                 Social Determinants of Health (SDOH) Interventions    Readmission Risk Interventions Readmission Risk Prevention Plan 05/30/2021  Transportation Screening Complete  SW Recovery Care/Counseling Consult Complete  Skilled Nursing Facility Complete  Some recent data might be hidden

## 2021-06-09 NOTE — Progress Notes (Signed)
FPTS Brief Progress Note  S: Patient denies any complaints at this time.  He reports that his pain is well controlled.    O: BP 134/66   Pulse 81   Temp 98 F (36.7 C) (Oral)   Resp 18   Ht 5\' 6"  (1.676 m)   Wt 56.7 kg   SpO2 100%   BMI 20.18 kg/m   General: No acute distress, resting comfortably in bed Cardio: Regular rate and rhythm no murmurs Abdominal: Bowel sounds present, no tenderness to palpation Skin/MSK: No signs of active bleeding, BKA on left with stump protector  A/P: Plan per day team - Orders reviewed. Labs for AM ordered, which was adjusted as needed.  -Patient has received a unit of blood, updated H/H-8/24.2 -Currently he has no active signs of bleeding, we are holding heparin and will discuss restarting on rounds in the morning  -Remains afebrile-will get a blood culture if he has a fever overnight  -CBG showed a sugar of 397, on SSI  [name], MD 06/10/2021, 12:42 AM PGY-1, [facility] Family Medicine Night Resident  Please page [phone] with questions.

## 2021-06-09 NOTE — Progress Notes (Signed)
Received a page alongside Dr. Zigmund Daniel from the patient's nurse that patient had heparin in place even though the nurse was pretty sure she was told earlier it would be held while he is receiving blood until his post H&H.  I told the nurse that I agree with holding at this time though I do wish to restart it as soon as possible as he recently had a surgery.  We will remove the order at this time and plan to add it back tomorrow morning once post H&H has returned as long as patient has no signs of active bleeding.

## 2021-06-09 NOTE — Consult Note (Signed)
[facility] Renal Consultation Note    Indication for Consultation:  Management of ESRD/hemodialysis; anemia, hypertension/volume and secondary hyperparathyroidism  LNL:[id number], [place]  HPI: Roger Vazquez is a 70 y.o. male. ESRD on HD MWF at [facility].  Past medical history significant for DMT2, HTN, Hx lung cancer, Hep C and polysubstance abuse.    Patient has had 2 recent hospitalizations.  Admitted from 7/14-7/22 due to gangrene and cellulitis of LLE s/p revascularization on 7/15 and amputation of L 2nd toe and debridement on 7/21.  He was readmitted 8/7-8/18 due to sepsis 2/2 worsened gangrene in L foot with TMA performed on 05/22/21.  Patient seen in follow up at [facility] on 8/23 noting continued progression of gangrene in L forefoot, and recommended BKA.   Patient presented to the the hospital for scheduled L BKA.  Pertinent labs since admission include K 5.1, Scr 7.51, BUN 44, alb 2.4, WBC 15.9 and Hgb 6.7.  Patient seen and examined at bedside.  Pain currently well controlled. Denies edema, orthopnea, SOB, CP, abdominal pain, n/v/d, weakness and fatigue. Patient admitted for further evaluation and management.  Past Medical History:  Diagnosis Date   Anemia    Blood dyscrasia    history of coagulation deficit per [facility] LPN nursing home   Cancer [facility])    Lung   Diabetes mellitus    Hepatitis    Hypertension    Renal disorder    MWF- [facility]   Past Surgical History:  Procedure Laterality Date   ABDOMINAL AORTOGRAM W/LOWER EXTREMITY Left 04/28/2021   Procedure: ABDOMINAL AORTOGRAM W/LOWER EXTREMITY;  Surgeon: Cherre Robins, MD;  Location: [place] CV [facility];  Service: Cardiovascular;  Laterality: Left;   AMPUTATION Left 05/04/2021   Procedure: LEFT SECOND TOE AMPUTATION;  Surgeon: Cherre Robins, MD;  Location: [place];  Service: Vascular;  Laterality: Left;   AMPUTATION Left 06/08/2021   Procedure: LEFT BELOW KNEE AMPUTATION;  Surgeon: Cherre Robins, MD;  Location: [place];  Service: Vascular;  Laterality: Left;   APPLICATION OF WOUND VAC Left 06/08/2021   Procedure: APPLICATION OF WOUND VAC;  Surgeon: Cherre Robins, MD;  Location: [place];  Service: Vascular;  Laterality: Left;   artiovenous graft placement Left    AV FISTULA PLACEMENT Right 11/27/2018   Procedure: INSERTION 4-7MM X 45CM GORETEX GRAFT RIGHT FOREARM ;  Surgeon: Rosetta Posner, MD;  Location: [place];  Service: Vascular;  Laterality: Right;   [place] REMOVAL Left 08/06/2018   Procedure: REMOVAL OF LEFT ARM GRAFT;  Surgeon: Rosetta Posner, MD;  Location: [place];  Service: Vascular;  Laterality: Left;   INSERTION OF DIALYSIS CATHETER Right 08/08/2018   Procedure: INSERTION OF TUNNELED  DIALYSIS CATHETER;  Surgeon: Marty Heck, MD;  Location: [place];  Service: Vascular;  Laterality: Right;   IR DIALY SHUNT INTRO [place] W/IMG RIGHT Right 05/26/2021   IR REMOVAL TUN CV CATH W/O FL  02/19/2019   IR US GUIDE VASC ACCESS RIGHT  05/26/2021   LUNG SURGERY     patient not if they removed part of lung. Was done at [place] Left 04/28/2021   Procedure: PERIPHERAL VASCULAR BALLOON ANGIOPLASTY;  Surgeon: Cherre Robins, MD;  Location: [place] CV [facility];  Service: Cardiovascular;  Laterality: Left;  popliteal and posterial tibial   TEE WITHOUT CARDIOVERSION N/A 07/31/2018   Procedure: TRANSESOPHAGEAL ECHOCARDIOGRAM (TEE);  Surgeon: Pixie Casino, MD;  Location: [facility] ENDOSCOPY;  Service: Cardiovascular;  Laterality: N/A;   TRANSMETATARSAL AMPUTATION Left 05/22/2021   Procedure: LEFT TRANSMETATARSAL AMPUTATION;  Surgeon: Elam Dutch, MD;  Location: [place];  Service: Vascular;  Laterality: Left;   History reviewed. No pertinent family history. Social History:  reports that he quit smoking about 21 years ago. His smoking use included cigarettes. He has a 5.00 pack-year smoking history. He quit smokeless tobacco use about 5  years ago. He reports current alcohol use of about 2.0 standard drinks per week. He reports current drug use. Drug: Marijuana. Allergies  Allergen Reactions   Sildenafil Other (See Comments)    Pt stated, "Makes my Blood Pressure drop"   Prior to Admission medications   Medication Sig Start Date End Date Taking? Authorizing Provider  acetaminophen (TYLENOL) 500 MG tablet Take 1 tablet (500 mg total) by mouth every 6 (six) hours. 05/05/21  Yes Ezequiel Essex, MD  aspirin 81 MG chewable tablet Chew 81 mg by mouth daily. (0800)   Yes [provider]  calcium acetate (PHOSLO) 667 MG capsule Take 667 mg by mouth 3 (three) times daily with meals. (0800, 1300 & 1700)   Yes [provider]  clopidogrel (PLAVIX) 75 MG tablet Take 1 tablet (75 mg total) by mouth daily. 05/05/21  Yes Ezequiel Essex, MD  Multiple Vitamin (MULITIVITAMIN WITH MINERALS) TABS Take 1 tablet by mouth daily. 10/12/11  Yes Robbie Lis, MD  polyethylene glycol (MIRALAX / GLYCOLAX) 17 g packet Take 17 g by mouth 2 (two) times daily. 05/05/21  Yes Ezequiel Essex, MD  senna (SENOKOT) 8.6 MG TABS tablet Take 1 tablet (8.6 mg total) by mouth daily. 05/05/21  Yes Ezequiel Essex, MD  lidocaine-prilocaine (EMLA) cream Apply 1 application topically as needed. 04/28/21   [provider]   Current Facility-Administered Medications  Medication Dose Route Frequency Provider Last Rate Last Admin   acetaminophen (TYLENOL) tablet 500 mg  500 mg Oral Q6H Espinoza, Alejandra, DO   500 mg at 06/09/21 1633   aspirin chewable tablet 81 mg  81 mg Oral Daily Sharion Settler, DO   81 mg at 06/09/21 0836   calcium acetate (PHOSLO) capsule 667 mg  667 mg Oral TID WC Espinoza, Alejandra, DO   667 mg at 06/09/21 1633   ceFAZolin (ANCEF) IVPB 1 g/50 mL premix  1 g Intravenous Q24H Hammons, Kimberly B, RPH       Chlorhexidine Gluconate Cloth 2 % PADS 6 each  6 each Topical Once Sharion Settler, DO       And    Chlorhexidine Gluconate Cloth 2 % PADS 6 each  6 each Topical Once Sharion Settler, DO       Chlorhexidine Gluconate Cloth 2 % PADS 6 each  6 each Topical Q0600 Orenthal Debski, PA   6 each at 06/09/21 1030   heparin injection 5,000 Units  5,000 Units Subcutaneous Q8H Espinoza, Alejandra, DO   5,000 Units at 06/09/21 1404   lactated ringers infusion   Intravenous Continuous Espinoza, Alejandra, DO       multivitamin with minerals tablet 1 tablet  1 tablet Oral Daily Sharion Settler, DO   1 tablet at 06/09/21 0836   oxyCODONE (Oxy IR/ROXICODONE) immediate release tablet 5 mg  5 mg Oral Q6H PRN Simmons-Robinson, Makiera, MD       polyethylene glycol (MIRALAX / GLYCOLAX) packet 17 g  17 g Oral BID Espinoza, Alejandra, DO   17 g at 06/09/21 0836   senna (SENOKOT) tablet 8.6 mg  1  tablet Oral Daily Sharion Settler, DO   8.6 mg at 06/09/21 0836   Labs: Basic Metabolic Panel: Recent Labs  Lab 06/08/21 1051 06/09/21 0336  NA 138 136  K 4.1 5.1  CL 92* 93*  CO2 26 24  GLUCOSE 161* 265*  BUN 29* 44*  CREATININE 6.37* 7.51*  CALCIUM 9.3 9.0  PHOS  --  7.8*   Liver Function Tests: Recent Labs  Lab 06/08/21 1051 06/09/21 0336  AST 36  --   ALT 19  --   ALKPHOS 91  --   BILITOT 1.3*  --   PROT 8.7*  --   ALBUMIN 2.9* 2.4*   CBC: Recent Labs  Lab 06/08/21 1051 06/09/21 0336 06/09/21 1526  WBC 19.9* 15.9*  --   HGB 9.0* 7.0* 6.7*  HCT 28.4* 22.1* 19.4*  MCV 104.8* 103.8*  --   PLT 480* 405*  --     CBG: Recent Labs  Lab 06/08/21 1049 06/08/21 1249 06/08/21 1629  GLUCAP 150* 169* 171*   ROS: All others negative except those listed in HPI.  Physical Exam: Vitals:   06/09/21 0000 06/09/21 0755 06/09/21 1232 06/09/21 1617  BP: (!) 123/59 (!) 144/64 137/61 (!) 131/57  Pulse: 93 86 87 87  Resp: 18 16 16 16   Temp: 98.3 F (36.8 C) 97.7 F (36.5 C) 97.8 F (36.6 C) 98.6 F (37 C)  TempSrc: Oral Oral Oral Oral  SpO2: 100% 100% 100% 100%  Weight:       Height:         General: chronically ill appearing male in NAD Head: NCAT sclera not icteric MMM Neck: Supple. No lymphadenopathy Lungs: mostly CTA bilaterally. +rhonchi on R. No wheeze or rales. Breathing is unlabored. Heart: RRR. No murmur, rubs or gallops.  Abdomen: soft, nontender, +BS, no guarding, no rebound tenderness Lower extremities:L BKA in cannister, RLE with trace edema Neuro: Lethargic. Moves all extremities spontaneously. Psych:  Responds to questions appropriately with a normal affect. Dialysis Access: RU AVG +b/t  Dialysis Orders:  MWF - [facility]  3hrs 60min, BFR 400, DFR AF 1.5,  EDW 58kg, 2K/ 2Ca  Access: RU AVG  Heparin 2000 Mircera 50 mcg q2wks - last 41mcg on 7/27 Hectorol 59mcg IV qHD    Assessment/Plan:  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 Luan Pulling. ABX given. Per [facility]/primary  ESRD -  on HD MWF.  Orders written for HD today per regular schedule. K 5.1.   Hypertension/volume  - Blood pressure well controlled.  Not on antihypertensives.  Does not appear grossly volume overloaded.  UF as tolerated.   Anemia of CKD - Hgb 6.7 post surgery.  1 unit pRBC already ordered.  Check iron studies.  Will order aranesp.   Secondary Hyperparathyroidism -  corrected Ca elevated, hold VDRA.  May need to change binders from Calcium based binder. Phos elevated, continue calcium acetate for now and monitor Ca levels.   Nutrition - on dysphagia diet.  Needs fluid restrictions.  DMT2  Jen Mow, PA-C [employer] 06/09/2021, 4:39 PM

## 2021-06-09 NOTE — Progress Notes (Addendum)
[facility]   Subjective  - pain well controlled   Objective (!) 123/59 93 98.3 F (36.8 C) (Oral) 18 100%  Intake/Output Summary (Last 24 hours) at 06/09/2021 0657 Last data filed at 06/08/2021 1816 Gross per 24 hour  Intake 400 ml  Output 75 ml  Net 325 ml    Limb guard and vac to suction without drainage OP Thigh warm and soft Lungs non labored breathing   Assessment/Planning: POD # 1 L BKA  Dressing will be changed over the week end HGB 7.0 asymptomatic chronic on acute blood loss anemia will observe.  On HD will get transfusion at HD if deemed necessary.    Roger Vazquez 06/09/2021 6:57 AM --  Laboratory Lab Results: Recent Labs    06/08/21 1051 06/09/21 0336  WBC 19.9* 15.9*  HGB 9.0* 7.0*  HCT 28.4* 22.1*  PLT 480* 405*   BMET Recent Labs    06/08/21 1051 06/09/21 0336  NA 138 136  K 4.1 5.1  CL 92* 93*  CO2 26 24  GLUCOSE 161* 265*  BUN 29* 44*  CREATININE 6.37* 7.51*  CALCIUM 9.3 9.0    COAG Lab Results  Component Value Date   INR 1.1 06/08/2021   INR 1.1 05/18/2021   INR 1.0 04/27/2021   No results found for: PTT  VASCULAR STAFF ADDENDUM: I have independently interviewed and examined the patient. I agree with the above.  Keep Prevena on to leg until 06/15/21.   [name]. Stanford Breed, MD [facility] Phone Number: [phone] 06/09/2021 2:13 PM

## 2021-06-09 NOTE — NC FL2 (Signed)
[place] LEVEL OF CARE SCREENING TOOL     IDENTIFICATION  Patient Name: Roger Vazquez Birthdate: 08/27/1951 Sex: male Admission Date (Current Location): 06/08/2021  [facility] and [place] Number:  Herbalist and Address:  [facility] [place] [place] [address] [place], [place] [address]      Provider Number: [id number]  Attending Physician Name and Address:  Martyn Malay, MD  Relative Name and Phone Number:  [name] [phone]    Current Level of Care: Hospital Recommended Level of Care: [place] Prior Approval Number:    Date Approved/Denied:   PASRR Number: [id number]  Discharge Plan: SNF    Current Diagnoses: Patient Active Problem List   Diagnosis Date Noted   Peripheral vascular disease ([place]) 06/09/2021   S/P BKA (below knee amputation), left ([place]) 06/08/2021   Status post amputation of foot through metatarsal bone ([place])    Altered mental status 05/19/2021   Left foot infection    Alcohol abuse 05/16/2021   Erectile dysfunction 05/16/2021   Hydronephrosis 05/16/2021   Acute hepatitis C 05/16/2021   Diabetic retinopathy ([place]) 05/16/2021   History of hemodialysis 05/16/2021   Iron deficiency anemia 05/16/2021   Malignant neoplasm of upper lobe, left bronchus or lung ([place]) 05/16/2021   Noncompliance with medication regimen 05/16/2021   Non-small cell lung cancer ([place]) 05/16/2021   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   Vitreous hemorrhage, left eye ([place]) 05/16/2021   Cellulitis of left lower extremity    Gangrene ([place]) 04/27/2021   Diarrhea, unspecified 04/29/2019   Mild protein-calorie malnutrition ([place]) 08/20/2018   Fever    Infection of AV graft for dialysis ([place]) 08/05/2018   Symptomatic anemia 08/05/2018   Hypokalemia 08/05/2018   GERD (gastroesophageal reflux disease) 08/05/2018   Sepsis due to unspecified Staphylococcus ([place]) 08/02/2018   ESRD on hemodialysis ([place])    Bacteremia due  to methicillin susceptible Staphylococcus aureus (MSSA) 07/28/2018   Sepsis ([place]) 07/27/2018   Headache 01/14/2017   Fluid overload, unspecified 12/13/2015   Anemia in chronic kidney disease 12/09/2015   Chronic kidney disease, stage 5 ([place]) 12/09/2015   Coagulation defect, unspecified ([place]) 12/09/2015   Encounter for adjustment and management of vascular access device 12/09/2015   Other secondary pulmonary hypertension ([place]) 12/09/2015   Pruritus, unspecified 12/09/2015   Secondary hyperparathyroidism of renal origin ([place]) 12/09/2015   Shortness of breath 12/09/2015   Abdominal pain, other specified site 10/07/2011   Nausea and vomiting 10/07/2011   VITAMIN D DEFICIENCY 03/19/2008   FRACTURE, TIBIA 02/09/2008   HTN (hypertension) 11/25/2007   HEPATITIS C 05/27/2007   Type 2 diabetes mellitus with chronic kidney disease on chronic dialysis ([place]) 05/27/2007   ERECTILE DYSFUNCTION 05/27/2007   ABUSE, ALCOHOL, IN REMISSION 05/27/2007   ABUSE, OPIOID, IN REMISSION 05/27/2007   ABUSE, COCAINE, IN REMISSION 05/27/2007    Orientation RESPIRATION BLADDER Height & Weight     Self, Time, Situation, Place  Normal Incontinent Weight: 125 lb (56.7 kg) Height:  5\' 6"  (167.6 cm)  BEHAVIORAL SYMPTOMS/MOOD NEUROLOGICAL BOWEL NUTRITION STATUS      Incontinent Diet (see d/c summary)  AMBULATORY STATUS COMMUNICATION OF NEEDS Skin   Limited Assist Verbally Other (Comment), Surgical wounds                       Personal Care Assistance Level of Assistance  Bathing, Feeding, Dressing Bathing Assistance: Limited assistance Feeding assistance: Limited assistance Dressing Assistance: Limited assistance  Functional Limitations Info  Sight, Hearing, Speech Sight Info: Impaired Hearing Info: Impaired Speech Info: Adequate    SPECIAL CARE FACTORS FREQUENCY  PT (By licensed PT), OT (By licensed OT)     PT Frequency: 5x/ week OT Frequency: 5x/ week            Contractures  Contractures Info: Not present    Additional Factors Info  Code Status, Allergies Code Status Info: Full Allergies Info: Sildenafil           Current Medications (06/09/2021):  This is the current hospital active medication list Current Facility-Administered Medications  Medication Dose Route Frequency Provider Last Rate Last Admin   acetaminophen (TYLENOL) tablet 500 mg  500 mg Oral Q6H Espinoza, Alejandra, DO   500 mg at 06/09/21 1114   aspirin chewable tablet 81 mg  81 mg Oral Daily Espinoza, Alejandra, DO   81 mg at 06/09/21 0836   calcium acetate (PHOSLO) capsule 667 mg  667 mg Oral TID WC Espinoza, Alejandra, DO   667 mg at 06/09/21 1114   ceFAZolin (ANCEF) IVPB 1 g/50 mL premix  1 g Intravenous Q24H Hammons, Kimberly B, RPH       Chlorhexidine Gluconate Cloth 2 % PADS 6 each  6 each Topical Once Sharion Settler, DO       And   Chlorhexidine Gluconate Cloth 2 % PADS 6 each  6 each Topical Once Sharion Settler, DO       Chlorhexidine Gluconate Cloth 2 % PADS 6 each  6 each Topical Q0600 Penninger, Lindsay, PA   6 each at 06/09/21 1030   heparin injection 5,000 Units  5,000 Units Subcutaneous Q8H Espinoza, Alejandra, DO   5,000 Units at 06/09/21 1404   lactated ringers infusion   Intravenous Continuous Espinoza, Alejandra, DO       multivitamin with minerals tablet 1 tablet  1 tablet Oral Daily Sharion Settler, DO   1 tablet at 06/09/21 7591   oxyCODONE (Oxy IR/ROXICODONE) immediate release tablet 5 mg  5 mg Oral Q6H PRN Simmons-Robinson, Makiera, MD       polyethylene glycol (MIRALAX / GLYCOLAX) packet 17 g  17 g Oral BID Nita Sells, Alejandra, DO   17 g at 06/09/21 6384   senna (SENOKOT) tablet 8.6 mg  1 tablet Oral Daily Espinoza, Alejandra, DO   8.6 mg at 06/09/21 6659     Discharge Medications: Please see discharge summary for a list of discharge medications.  Relevant Imaging Results:  Relevant Lab Results:   Additional Information [phone] HD Mon,  Wed, Friday, Both Covid Vaccines  Dvonte Gatliff F Yazmyn [place], [place]

## 2021-06-09 NOTE — Evaluation (Signed)
Physical Therapy Evaluation Patient Details Name: Roger Vazquez MRN: [id number] DOB: Jul 27, 1951 Today's Date: 06/09/2021   History of Present Illness  Pt is a 70 y.o. 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 PMH is significant for ESRD on HD MWF, hx of lung CA, PAD, HTN, DM2, polysubstance abuse.   Clinical Impression  Pt presents with condition above and deficits mentioned below, see PT Problem List. Pt was most recently at a SNF following his transmetatarsal amputation. Currently, pt displays deficits in cognition, overall strength, L knee ROM, balance, and activity tolerance. He is at high risk for falls. Pt requires modA to transition supine > sit with HOB elevated and maxAx2 to squat pivot to the R today. Recommending pt return to SNF for short-term rehab to optimize his independence and safety with all functional mobility with an ultimate goal to return home. Will continue to follow acutely.    Follow Up Recommendations SNF;Supervision/Assistance - 24 hour    Equipment Recommendations  None recommended by PT (defer to next venue of care)    Recommendations for Other Services       Precautions / Restrictions Precautions Precautions: Fall Precaution Comments: L BKA, wound vac Restrictions Weight Bearing Restrictions: Yes LLE Weight Bearing: Non weight bearing      Mobility  Bed Mobility Overal bed mobility: Needs Assistance Bed Mobility: Supine to Sit     Supine to sit: Mod assist;HOB elevated     General bed mobility comments: Cues to sequence legs off L EOB, slow but good initiation by pt. ModA using bed pad to scoot hips to EOB with cues for pt to hold and pull on grab bars to ascend trunk.    Transfers Overall transfer level: Needs assistance Equipment used: 2 person hand held assist Transfers:  Squat Pivot Transfers     Squat pivot transfers: Max assist;+2 physical assistance;+2 safety/equipment     General transfer comment: R knee block provided with bil UE support, cuing pt to place R foot flat on ground to prepare for transfer and to extend knee, min initiation noted with extending knee. MaxAx2 to squat pivot to R bed > recliner.  Ambulation/Gait             General Gait Details: unable  Stairs            Wheelchair Mobility    Modified Rankin (Stroke Patients Only)       Balance Overall balance assessment: Needs assistance Sitting-balance support: Bilateral upper extremity supported;Feet supported Sitting balance-Leahy Scale: Poor Sitting balance - Comments: UE support and min guard-minA to sit statically EOB.   Standing balance support: Bilateral upper extremity supported Standing balance-Leahy Scale: Zero Standing balance comment: Bil UE support and maxAx2 to come to partial stand.                             Pertinent Vitals/Pain Pain Assessment: Faces Faces Pain Scale: Hurts little more Pain Location: L leg s/p BKA, "all over" Pain Descriptors / Indicators: Discomfort;Grimacing;Guarding;Operative site guarding Pain Intervention(s): Limited activity within patient's tolerance;Monitored during session;Repositioned;Premedicated before session    Home Living Family/patient expects to be discharged to:: Skilled nursing facility                      Prior Function Level of  Independence: Needs assistance   Gait / Transfers Assistance Needed: Pt recently at SNF following transmet amputation and assumingly was working with therapy at SNF prior to this admission  ADL's / Homemaking Assistance Needed: Likely needed assistance for all ADLs at SNF prior to this admission.  Comments: From chart review, prior to amputations: pt reports he was independent at home, driving, sister nearby and can assist as needed     Hand Dominance         Extremity/Trunk Assessment   Upper Extremity Assessment Upper Extremity Assessment: Defer to OT evaluation    Lower Extremity Assessment Lower Extremity Assessment: Generalized weakness;LLE deficits/detail;RLE deficits/detail RLE Deficits / Details: generalized weakness with difficulty initiating quads to extend knee to stand with transfer LLE Deficits / Details: S/p BKA limiting strength and ROM    Cervical / Trunk Assessment Cervical / Trunk Assessment: Normal  Communication   Communication: No difficulties  Cognition Arousal/Alertness: Awake/alert Behavior During Therapy: Flat affect Overall Cognitive Status: Impaired/Different from baseline Area of Impairment: Attention;Memory;Following commands;Safety/judgement;Awareness;Problem solving                   Current Attention Level: Sustained Memory: Decreased recall of precautions;Decreased short-term memory Following Commands: Follows one step commands with increased time Safety/Judgement: Decreased awareness of safety;Decreased awareness of deficits Awareness: Intellectual Problem Solving: Slow processing;Decreased initiation;Difficulty sequencing;Requires verbal cues;Requires tactile cues General Comments: Pt with flat affect, needing increased time to process simple single-step comamnds to sequence tasks. Pt with poor awareness into his deficits and safety. Pt repeatedly asking family about peopel who they told him had been dead for a while, including pt asking his sister if she was his mother. Sister was far across the room and reports she does look like their deceased mother though.      General Comments      Exercises     Assessment/Plan    PT Assessment Patient needs continued PT services  PT Problem List Decreased strength;Decreased activity tolerance;Decreased balance;Decreased mobility;Decreased knowledge of use of DME;Decreased knowledge of precautions;Pain;Decreased range of motion;Decreased  coordination;Decreased cognition;Decreased safety awareness;Decreased skin integrity       PT Treatment Interventions DME instruction;Gait training;Functional mobility training;Therapeutic activities;Balance training;Patient/family education;Therapeutic exercise;Neuromuscular re-education;Cognitive remediation;Wheelchair mobility training    PT Goals (Current goals can be found in the Care Plan section)  Acute Rehab PT Goals Patient Stated Goal: to go back to SNF PT Goal Formulation: With patient/family Time For Goal Achievement: 06/23/21 Potential to Achieve Goals: Good    Frequency Min 2X/week   Barriers to discharge        Co-evaluation PT/OT/SLP Co-Evaluation/Treatment: Yes Reason for Co-Treatment: For patient/therapist safety;To address functional/ADL transfers PT goals addressed during session: Mobility/safety with mobility;Balance         AM-PAC PT "6 Clicks" Mobility  Outcome Measure Help needed turning from your back to your side while in a flat bed without using bedrails?: A Lot Help needed moving from lying on your back to sitting on the side of a flat bed without using bedrails?: A Lot Help needed moving to and from a bed to a chair (including a wheelchair)?: Total Help needed standing up from a chair using your arms (e.g., wheelchair or bedside chair)?: Total Help needed to walk in hospital room?: Total Help needed climbing 3-5 steps with a railing? : Total 6 Click Score: 8    End of Session Equipment Utilized During Treatment: Gait belt Activity Tolerance: Patient tolerated treatment well Patient left: in chair;with chair alarm set;with call  bell/phone within reach;with family/visitor present Nurse Communication: Mobility status;Need for lift equipment PT Visit Diagnosis: Other abnormalities of gait and mobility (R26.89);Difficulty in walking, not elsewhere classified (R26.2);Unsteadiness on feet (R26.81);Muscle weakness (generalized) (M62.81);Pain Pain -  Right/Left: Left Pain - part of body: Leg    Time: 2458-0998 PT Time Calculation (min) (ACUTE ONLY): 21 min   Charges:   PT Evaluation $PT Eval Moderate Complexity: 1 Mod          Moishe Spice, PT, DPT Acute Rehabilitation Services  Pager: [phone] Office: [phone]   Orvan Falconer 06/09/2021, 1:53 PM

## 2021-06-10 DIAGNOSIS — Z992 Dependence on renal dialysis: Secondary | ICD-10-CM

## 2021-06-10 DIAGNOSIS — Z89512 Acquired absence of left leg below knee: Secondary | ICD-10-CM | POA: Diagnosis not present

## 2021-06-10 DIAGNOSIS — N186 End stage renal disease: Secondary | ICD-10-CM

## 2021-06-10 DIAGNOSIS — D62 Acute posthemorrhagic anemia: Secondary | ICD-10-CM

## 2021-06-10 DIAGNOSIS — E1122 Type 2 diabetes mellitus with diabetic chronic kidney disease: Secondary | ICD-10-CM | POA: Diagnosis not present

## 2021-06-10 LAB — HEMOGLOBIN AND HEMATOCRIT, BLOOD
HCT: 24.2 % — ABNORMAL LOW (ref 39.0–52.0)
Hemoglobin: 8 g/dL — ABNORMAL LOW (ref 13.0–17.0)

## 2021-06-10 LAB — GLUCOSE, CAPILLARY
Glucose-Capillary: 68 mg/dL — ABNORMAL LOW (ref 70–99)
Glucose-Capillary: 97 mg/dL (ref 70–99)
Glucose-Capillary: 173 mg/dL — ABNORMAL HIGH (ref 70–99)
Glucose-Capillary: 162 mg/dL — ABNORMAL HIGH (ref 70–99)
Glucose-Capillary: 217 mg/dL — ABNORMAL HIGH (ref 70–99)

## 2021-06-10 NOTE — Progress Notes (Signed)
[facility] Progress Note   Subjective:   Patient seen and examined at bedside.  Hypoglycemic event this AM.  Dialysis completed overnight without issues. No specific complaints today.  Pain well controlled.  Denies CP, SOB, n/v/d, abdominal pain and fatigue.   Objective Vitals:   06/10/21 0330 06/10/21 0400 06/10/21 0530 06/10/21 0619  BP: (!) 102/57 101/64 129/69 123/77  Pulse:   79 81  Resp:   18 16  Temp:   98.4 F (36.9 C) 98.5 F (36.9 C)  TempSrc:   Oral   SpO2:   98% 94%  Weight:      Height:       Physical Exam General:chronically ill appearing male in NAD Heart:RRR, no mrg Lungs:CTAB, nml WOB on RA Abdomen:soft, NTND Extremities:no LE edema, L BKA in cannister  Dialysis Access: RU AVG +b/t   Filed Weights   06/08/21 1047  Weight: 56.7 kg    Intake/Output Summary (Last 24 hours) at 06/10/2021 1229 Last data filed at 06/10/2021 0854 Gross per 24 hour  Intake 800 ml  Output 836 ml  Net -36 ml    Additional Objective Labs: Basic Metabolic Panel: Recent Labs  Lab 06/08/21 1051 06/09/21 0336  NA 138 136  K 4.1 5.1  CL 92* 93*  CO2 26 24  GLUCOSE 161* 265*  BUN 29* 44*  CREATININE 6.37* 7.51*  CALCIUM 9.3 9.0  PHOS  --  7.8*   Liver Function Tests: Recent Labs  Lab 06/08/21 1051 06/09/21 0336  AST 36  --   ALT 19  --   ALKPHOS 91  --   BILITOT 1.3*  --   PROT 8.7*  --   ALBUMIN 2.9* 2.4*    CBC: Recent Labs  Lab 06/08/21 1051 06/09/21 0336 06/09/21 1526 06/10/21 0001  WBC 19.9* 15.9*  --   --   HGB 9.0* 7.0* 6.7* 8.0*  HCT 28.4* 22.1* 19.4* 24.2*  MCV 104.8* 103.8*  --   --   PLT 480* 405*  --   --     CBG: Recent Labs  Lab 06/08/21 1629 06/09/21 2122 06/10/21 0608 06/10/21 0653 06/10/21 1133  GLUCAP 171* 397* 68* 97 173*    Medications:   acetaminophen  500 mg Oral Q6H   aspirin  81 mg Oral Daily   calcium acetate  667 mg Oral TID WC   Chlorhexidine Gluconate Cloth  6 each Topical Once   And    Chlorhexidine Gluconate Cloth  6 each Topical Once   Chlorhexidine Gluconate Cloth  6 each Topical Q0600   insulin aspart  0-9 Units Subcutaneous TID WC   multivitamin with minerals  1 tablet Oral Daily   polyethylene glycol  17 g Oral BID   senna  1 tablet Oral Daily    Dialysis Orders: MWF - [facility]  3hrs 36min, BFR 400, DFR AF 1.5,  EDW 58kg, 2K/ 2Ca   Access: RU AVG  Heparin 2000 Mircera 50 mcg q2wks - last 2mcg on 7/27 Hectorol 48mcg IV qHD     Assessment/Plan:  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 Luan Pulling. ABX given. Per VVS/primary  ESRD -  on HD MWF.  HD completed overnight.  Plan for next HD on Monday.  Hypertension/volume  - Blood pressure well controlled without medications. Does not appear grossly volume overloaded.  UF as tolerated.   Anemia of CKD - Hgb drop post surgery to 6.7, s/p 1 unit pRBC with HD.  Hgb  improved to 8.0 today.  Aranesp 138mcg SQ given yesterday.  Check iron studies.   Secondary Hyperparathyroidism -  corrected Ca elevated, hold VDRA.  May need to change binders from Calcium based binder. Phos elevated, continue calcium acetate for now and monitor Ca levels.   Nutrition - on dysphagia diet.  Needs fluid restrictions.  DMT2  Jen Mow, PA-C [facility] 06/10/2021,12:29 PM  LOS: 2 days

## 2021-06-10 NOTE — Plan of Care (Signed)
  Problem: Coping: Goal: Level of anxiety will decrease Outcome: Progressing   Problem: Pain Managment: Goal: General experience of comfort will improve Outcome: Progressing   

## 2021-06-10 NOTE — Progress Notes (Signed)
Pt's CBG at 6 am was 68. 4 oz of OJ was given; re-checked at 7 am-97.

## 2021-06-10 NOTE — Progress Notes (Signed)
Pt is back from HD. ?

## 2021-06-10 NOTE — Progress Notes (Signed)
Pt is to HD. Report is given to RN.

## 2021-06-10 NOTE — TOC Progression Note (Signed)
Transition of Care [facility]) - Progression Note    Patient Details  Name: Roger Vazquez MRN: [id number] Date of Birth: 06-25-1951  Transition of Care [facility]) CM/SW [place], [place] Phone Number: 06/10/2021, 12:53 PM  Clinical Narrative:    CSW contacted [facility] to follow up to see if pt received services there. Nicole Kindred at [facility] said pt was there.  The facility does not take preveena wound vac. TOC will continue to assist with disposition planning.       Expected Discharge Plan and Services                                                 Social Determinants of Health (SDOH) Interventions    Readmission Risk Interventions Readmission Risk Prevention Plan 05/30/2021  Transportation Screening Complete  SW Recovery Care/Counseling Consult Complete  Skilled Nursing Facility Complete  Some recent data might be hidden

## 2021-06-10 NOTE — Progress Notes (Signed)
Family Medicine Teaching Service Daily Progress Note Intern Pager: [phone]  Patient name: Roger Vazquez Medical record number: [id number] Date of birth: 03-15-1951 Age: 70 y.o. Gender: male  Primary Care Provider: [facility] Consultants: Vascular surgery Code Status: Full  Pt Overview and Major Events to Date:  Admitted 8/25-status post left BKA  Assessment and Plan:  Roger Vazquez is a 70 year old male presenting with left foot gangrene status post left BKA.  Past medical history is significant for ESRD on HD MWF, history of lung cancer, PAD, hypertension, type 2 diabetes mellitus, polysubstance abuse.  Left foot gangrene S/P left BKA Postop day 2.  As previously mentioned patient had fever on admission prior to amputation.  Left foot gangrene was likely the cause of the fever.  If patient does become febrile s/p amputation, we will continue with blood cultures x2, chest x-ray, and starting antibiotics.  Patient does not exhibit signs of bleeding.  WBC 19.9 >15.9.  Vital signs have been stable overnight.  Per vascular surgery, dressing will be changed over the weekend.  -Pain control: Tylenol 500 mg every 6 hours and Oxy 5 mg every 6 hours as needed -Continue to hold Plavix -PT/OT following-recommend SNF -Vascular surgery following, appreciate recs -Monitor for fever -Dysphagia 3 diet -CBC/RFP pending   Acute blood loss anemia 2/2 to L BKA in setting of anemia of chronic disease Patient's hemoglobin 7 > 6.7 and then received 1 unit PRBCs.  Updated H&H showed 8/24.2.  Patient does not exhibit any signs of anemia and has no signs of active bleeding.  -Continue to monitor hemoglobin -Currently holding heparin and Plavix-If updated Hgb is stable, restart plavix and heparin   ESRD on HD MWF Creatinine 7.51, GFR 7. Updated RFP pending. Patient received hemodialysis yesterday into this morning. -Avoid nephrotoxic medications and agents -Nephro following, appreciate  recommendation -Continue PhosLo and renal vitamin  DM2 CBGs 169 > 171 > 397.  Began sliding scale insulin yesterday.  -Monitors regular CBGs every 8 hours -Continue SSI  History of peripheral vascular disease We will restart Plavix as able and monitor for bleeding  H/O alcohol abuse Low concern for withdrawal at this time as patient has been at a SNF.  No evidence of withdrawal symptoms. -No CIWA  H/O delirium Patient has had delirious episodes during the last hospitalization.  We will try to redirect the patient as able. -Delirium precautions -Attempt redirection, Seroquel as an option if needed  FEN/GI: Dysphagia 3 diet PPx: Holding heparin Dispo:SNF.  Barriers include pain control, anemia control, and clinical improvement  Subjective:  Pt reports that he is in some pain this morning. HD made him feel tired. Denies dizziness but does endorse some lightheadedness. Denies CP and SOB.   Objective: Temp:  [97.7 F (36.5 C)-98.6 F (37 C)] 98 F (36.7 C) (08/26 2038) Pulse Rate:  [81-87] 81 (08/26 2038) Resp:  [16-18] 18 (08/26 1740) BP: (130-144)/(57-66) 134/66 (08/26 2038) SpO2:  [97 %-100 %] 100 % (08/26 2038) Physical Exam Constitutional:      Comments: Status post left BKA with stump protector and SCD on right  HENT:     Head: Normocephalic and atraumatic.  Pulmonary:     Effort: Pulmonary effort is normal. No respiratory distress.     Breath sounds: Normal breath sounds.  Abdominal:     General: Bowel sounds are normal. There is no distension.     Palpations: Abdomen is soft.  Skin:    General: Skin is warm and dry.  Comments: Oozing at the site where he receives heparin injections   Neurological:     Mental Status: He is alert.  Psychiatric:     Comments: Flat affect     Laboratory: Recent Labs  Lab 06/08/21 1051 06/09/21 0336 06/09/21 1526 06/10/21 0001  WBC 19.9* 15.9*  --   --   HGB 9.0* 7.0* 6.7* 8.0*  HCT 28.4* 22.1* 19.4* 24.2*  PLT 480*  405*  --   --    Recent Labs  Lab 06/08/21 1051 06/09/21 0336  NA 138 136  K 4.1 5.1  CL 92* 93*  CO2 26 24  BUN 29* 44*  CREATININE 6.37* 7.51*  CALCIUM 9.3 9.0  PROT 8.7*  --   BILITOT 1.3*  --   ALKPHOS 91  --   ALT 19  --   AST 36  --   GLUCOSE 161* 265*    Imaging/Diagnostic Tests: EKG: Sinus rhythm, 1st degree AV block, LVH  Roger Emery, MD 06/10/2021, 2:22 AM PGY-1, [place] Intern pager: [phone], text pages welcome

## 2021-06-11 DIAGNOSIS — N186 End stage renal disease: Secondary | ICD-10-CM | POA: Diagnosis not present

## 2021-06-11 DIAGNOSIS — Z89512 Acquired absence of left leg below knee: Secondary | ICD-10-CM | POA: Diagnosis not present

## 2021-06-11 DIAGNOSIS — Z992 Dependence on renal dialysis: Secondary | ICD-10-CM | POA: Diagnosis not present

## 2021-06-11 LAB — RENAL FUNCTION PANEL
Albumin: 2.2 g/dL — ABNORMAL LOW (ref 3.5–5.0)
Albumin: 2.4 g/dL — ABNORMAL LOW (ref 3.5–5.0)
Anion gap: 11 (ref 5–15)
Anion gap: 13 (ref 5–15)
BUN: 44 mg/dL — ABNORMAL HIGH (ref 8–23)
BUN: 47 mg/dL — ABNORMAL HIGH (ref 8–23)
CO2: 29 mmol/L (ref 22–32)
CO2: 27 mmol/L (ref 22–32)
Calcium: 8.2 mg/dL — ABNORMAL LOW (ref 8.9–10.3)
Calcium: 8.7 mg/dL — ABNORMAL LOW (ref 8.9–10.3)
Chloride: 94 mmol/L — ABNORMAL LOW (ref 98–111)
Chloride: 95 mmol/L — ABNORMAL LOW (ref 98–111)
Creatinine, Ser: 6.02 mg/dL — ABNORMAL HIGH (ref 0.61–1.24)
Creatinine, Ser: 6.49 mg/dL — ABNORMAL HIGH (ref 0.61–1.24)
GFR, Estimated: 9 mL/min — ABNORMAL LOW (ref >60)
GFR, Estimated: 9 mL/min — ABNORMAL LOW (ref >60)
Glucose, Bld: 233 mg/dL — ABNORMAL HIGH (ref 70–99)
Glucose, Bld: 101 mg/dL — ABNORMAL HIGH (ref 70–99)
Phosphorus: 2.8 mg/dL (ref 2.5–4.6)
Phosphorus: 3.4 mg/dL (ref 2.5–4.6)
Potassium: 4.2 mmol/L (ref 3.5–5.1)
Potassium: 4.2 mmol/L (ref 3.5–5.1)
Sodium: 134 mmol/L — ABNORMAL LOW (ref 135–145)
Sodium: 135 mmol/L (ref 135–145)

## 2021-06-11 LAB — CBC
HCT: 25.4 % — ABNORMAL LOW (ref 39.0–52.0)
HCT: 26.5 % — ABNORMAL LOW (ref 39.0–52.0)
Hemoglobin: 8.4 g/dL — ABNORMAL LOW (ref 13.0–17.0)
Hemoglobin: 8.7 g/dL — ABNORMAL LOW (ref 13.0–17.0)
MCH: 32.1 pg (ref 26.0–34.0)
MCH: 31.6 pg (ref 26.0–34.0)
MCHC: 33.1 g/dL (ref 30.0–36.0)
MCHC: 32.8 g/dL (ref 30.0–36.0)
MCV: 96.9 fL (ref 80.0–100.0)
MCV: 96.4 fL (ref 80.0–100.0)
Platelets: 337 10*3/uL (ref 150–400)
Platelets: 390 10*3/uL (ref 150–400)
RBC: 2.62 MIL/uL — ABNORMAL LOW (ref 4.22–5.81)
RBC: 2.75 MIL/uL — ABNORMAL LOW (ref 4.22–5.81)
RDW: 19.1 % — ABNORMAL HIGH (ref 11.5–15.5)
RDW: 18.6 % — ABNORMAL HIGH (ref 11.5–15.5)
WBC: 12.3 10*3/uL — ABNORMAL HIGH (ref 4.0–10.5)
WBC: 12.8 10*3/uL — ABNORMAL HIGH (ref 4.0–10.5)
nRBC: 0 % (ref 0.0–0.2)
nRBC: 0 % (ref 0.0–0.2)

## 2021-06-11 LAB — IRON AND TIBC
Iron: 43 ug/dL — ABNORMAL LOW (ref 45–182)
Saturation Ratios: 27 % (ref 17.9–39.5)
TIBC: 157 ug/dL — ABNORMAL LOW (ref 250–450)
UIBC: 114 ug/dL

## 2021-06-11 LAB — GLUCOSE, CAPILLARY
Glucose-Capillary: 194 mg/dL — ABNORMAL HIGH (ref 70–99)
Glucose-Capillary: 101 mg/dL — ABNORMAL HIGH (ref 70–99)
Glucose-Capillary: 221 mg/dL — ABNORMAL HIGH (ref 70–99)
Glucose-Capillary: 157 mg/dL — ABNORMAL HIGH (ref 70–99)

## 2021-06-11 LAB — FERRITIN: Ferritin: 2061 ng/mL — ABNORMAL HIGH (ref 24–336)

## 2021-06-11 MED ORDER — DOXERCALCIFEROL 4 MCG/2ML IV SOLN
2.0000 ug | INTRAVENOUS | Status: DC
  Administered 2021-06-13: 2 ug via INTRAVENOUS
  Filled 2021-06-11 (×2): qty 2

## 2021-06-11 MED ORDER — CLOPIDOGREL BISULFATE 75 MG PO TABS
75.0000 mg | ORAL_TABLET | Freq: Every day | ORAL | Status: DC
  Administered 2021-06-11 – 2021-06-13 (×3): 75 mg via ORAL
  Filled 2021-06-11 (×3): qty 1

## 2021-06-11 MED ORDER — HEPARIN SODIUM (PORCINE) 5000 UNIT/ML IJ SOLN
5000.0000 [IU] | Freq: Three times a day (TID) | INTRAMUSCULAR | Status: DC
  Administered 2021-06-11 – 2021-06-13 (×7): 5000 [IU] via SUBCUTANEOUS
  Filled 2021-06-11 (×8): qty 1

## 2021-06-11 NOTE — Progress Notes (Signed)
FPTS Brief Progress Note  S:Patient resting in bed comfortably.  O: BP 140/62 (BP Location: Left Arm)   Pulse 83   Temp 98.1 F (36.7 C)   Resp 18   Ht 5\' 6"  (1.676 m)   Wt 56.7 kg   SpO2 100%   BMI 20.18 kg/m   Gen: Sleeping comfortably in bed, in no acute distress Resp: Normal work of breathing  A/P: - Orders reviewed. Labs for AM ordered, which was adjusted as needed.  Continue current plan in place per day.  Orvis Brill, DO 06/11/2021, 10:16 PM PGY-1, [facility] Night Resident  Please page [phone] with questions.

## 2021-06-11 NOTE — Plan of Care (Signed)

## 2021-06-11 NOTE — Progress Notes (Signed)
[facility] Progress Note   Subjective:   Patient seen and examined at bedside.  Reports stump pain today but no other complaints.  Denies CP, SOB, n/v/d, abdominal pain and fatigue.   Objective Vitals:   06/11/21 0415 06/11/21 0428 06/11/21 0545 06/11/21 0822  BP: (!) 160/75 (!) 160/75 (!) 114/57 (!) 173/86  Pulse: 81 81  86  Resp:    18  Temp: 98.7 F (37.1 C) 98.7 F (37.1 C)  98.7 F (37.1 C)  TempSrc: Oral   Oral  SpO2: 100% 100%  100%  Weight:      Height:       Physical Exam General:chronically ill appearing male in NAD, AAOx3 Heart:RRR, no mrg Lungs:CTAB, nml WOB on RA Abdomen:soft, NTND Extremities:no LE edema on R, L BKA in cannister  Dialysis Access: RU AVG +b/t   Filed Weights   06/08/21 1047  Weight: 56.7 kg    Intake/Output Summary (Last 24 hours) at 06/11/2021 1138 Last data filed at 06/11/2021 0713 Gross per 24 hour  Intake 120 ml  Output 0 ml  Net 120 ml    Additional Objective Labs: Basic Metabolic Panel: Recent Labs  Lab 06/08/21 1051 06/09/21 0336 06/11/21 0224  NA 138 136 134*  K 4.1 5.1 4.2  CL 92* 93* 94*  CO2 26 24 29   GLUCOSE 161* 265* 233*  BUN 29* 44* 44*  CREATININE 6.37* 7.51* 6.02*  CALCIUM 9.3 9.0 8.2*  PHOS  --  7.8* 2.8   Liver Function Tests: Recent Labs  Lab 06/08/21 1051 06/09/21 0336 06/11/21 0224  AST 36  --   --   ALT 19  --   --   ALKPHOS 91  --   --   BILITOT 1.3*  --   --   PROT 8.7*  --   --   ALBUMIN 2.9* 2.4* 2.2*    CBC: Recent Labs  Lab 06/08/21 1051 06/09/21 0336 06/09/21 1526 06/10/21 0001 06/11/21 0224  WBC 19.9* 15.9*  --   --  12.3*  HGB 9.0* 7.0* 6.7* 8.0* 8.4*  HCT 28.4* 22.1* 19.4* 24.2* 25.4*  MCV 104.8* 103.8*  --   --  96.9  PLT 480* 405*  --   --  337   CBG: Recent Labs  Lab 06/10/21 0653 06/10/21 1133 06/10/21 1625 06/10/21 2044 06/11/21 0608  GLUCAP 97 173* 162* 217* 194*    Medications:   acetaminophen  500 mg Oral Q6H   aspirin  81 mg Oral  Daily   calcium acetate  667 mg Oral TID WC   Chlorhexidine Gluconate Cloth  6 each Topical Once   And   Chlorhexidine Gluconate Cloth  6 each Topical Once   Chlorhexidine Gluconate Cloth  6 each Topical Q0600   clopidogrel  75 mg Oral Daily   heparin  5,000 Units Subcutaneous Q8H   insulin aspart  0-9 Units Subcutaneous TID WC   multivitamin with minerals  1 tablet Oral Daily   polyethylene glycol  17 g Oral BID   senna  1 tablet Oral Daily    Dialysis Orders: MWF - [facility]  3hrs 56min, BFR 400, DFR AF 1.5,  EDW 58kg, 2K/ 2Ca   Access: RU AVG  Heparin 2000 Mircera 50 mcg q2wks - last 58mcg on 7/27 Hectorol 71mcg IV qHD     Assessment/Plan:  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 Luan Pulling. ABX given. Per VVS/primary  ESRD -  on HD MWF.  Next HD on Monday.  Hypertension/volume  - Blood pressure well controlled without medications. Does not appear grossly volume overloaded.  UF as tolerated.   Anemia of CKD - Hgb drop post surgery to 6.7, s/p 1 unit pRBC with HD on 8/26.  Hgb improved to 8.4 today.  Aranesp 17mcg SQ given 8/26  Check iron studies.   Secondary Hyperparathyroidism -  corrected Ca and phos in goal.  Resume VDRA, follow labs. May need to change binders from Calcium based binder if  hypercalcemia reoccurs.   Nutrition - on dysphagia diet.  Needs fluid restrictions.  DMT2  Jen Mow, PA-C [facility] 06/11/2021,11:38 AM  LOS: 3 days

## 2021-06-11 NOTE — Progress Notes (Signed)
Family Medicine Teaching Service Daily Progress Note Intern Pager: [phone]  Patient name: Roger Vazquez Medical record number: [id number] Date of birth: January 22, 1951 Age: 70 y.o. 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  Past medical history is significant for ESRD on HD MWF, history of lung cancer, PAD, hypertension, type 2 diabetes mellitus, polysubstance abuse.   Left foot gangrene S/P left BKA Post op day # 3.  Stable. Feels that pain is controlled adequately on current regimen. Vitals stable, no infectious signs. WBC 12.3 improving.  -Pain control: Tylenol 500 mg q6h and Oxycodone 5 mg q6h prn  -vascular surgery following, appreciate recs  -plavix restarted -PT/OT following -Monitor for fever, consider blood cultures and CXR if appropriate  -Dysphagia 3 diet -pending SNF placement   Acute blood loss anemia 2/2 to L BKA in setting of anemia of chronic disease Hgb 8.4 this morning. No known active source of bleeding. -Continue to monitor hemoglobin, awaiting Hgb -transfusion threshold of 8 -restarted plavix and heparin, monitor clinically, consider repeat Hgb this afternoon if indicated  -follow up CBC 8/29   ESRD on HD  MWF schedule. Cr 6.02 with GFR 9.  -nephro following, appreciate continued involvement and recommendations  -continue phoslo and renal vitamin -avoid nephrotoxic agents as able   Hyponatremia Na 134 this morning, likely secondary to hyperglycemia. Corrected to Na 136.  -monitor clinically, including mental status -monitor renal function panel as appropriate    Type 2 DM Most recent CBGs 173 > 162 > 217.   -sSSI -monitor CBGs  History of peripheral vascular disease -restarted plavix   History of  delirium -Delirium precautions  FEN/GI: dysphagia 3 diet PPx: heparin Dispo:SNF pending clinical improvement . Barriers include bed availability and compliance with existing wound vac.   Subjective:  No acute overnight events reported. Patient comfortably sleeping and awakens to my examination. Endorsing mild pain. Denies any dyspnea, chest pain or other symptoms.   Objective: Temp:  [98.4 F (36.9 C)-98.5 F (36.9 C)] 98.4 F (36.9 C) (08/27 1956) Pulse Rate:  [79-90] 90 (08/27 1956) Resp:  [14-18] 15 (08/27 1956) BP: (101-129)/(56-77) 108/60 (08/27 1956) SpO2:  [94 %-100 %] 100 % (08/27 1956) Physical Exam: General: Patient laying comfortably in bed, in no acute distress. Cardiovascular: RRR, no murmurs or gallops auscultated Respiratory: CTAB, no wheezing or rales, breathing comfortably on room air, no non-focal findings noted  Abdomen: soft, nontender, presence of bowel sounds  Extremities: s/p left BKA, clean dressing in place, SCDs in place on right LE, radial and distal pulses strong and equal bilaterally   Laboratory: Recent Labs  Lab 06/08/21 1051 06/09/21 0336 06/09/21 1526 06/10/21 0001  WBC 19.9* 15.9*  --   --   HGB 9.0* 7.0* 6.7* 8.0*  HCT 28.4* 22.1* 19.4* 24.2*  PLT 480* 405*  --   --    Recent Labs  Lab 06/08/21 1051 06/09/21 0336  NA 138 136  K 4.1 5.1  CL 92* 93*  CO2 26 24  BUN 29* 44*  CREATININE 6.37* 7.51*  CALCIUM 9.3 9.0  PROT 8.7*  --   BILITOT 1.3*  --   ALKPHOS 91  --   ALT 19  --   AST 36  --   GLUCOSE 161* 265*  Imaging/Diagnostic Tests: Results for orders placed or performed during the hospital encounter of 06/08/21 (from the past 24 hour(s))  Glucose, capillary     Status: Abnormal   Collection Time: 06/10/21  6:08 AM  Result Value Ref Range   Glucose-Capillary 68 (L) 70 - 99 mg/dL  Glucose, capillary     Status: None   Collection Time: 06/10/21  6:53 AM  Result Value Ref Range   Glucose-Capillary 97 70 - 99 mg/dL   Glucose, capillary     Status: Abnormal   Collection Time: 06/10/21 11:33 AM  Result Value Ref Range   Glucose-Capillary 173 (H) 70 - 99 mg/dL  Glucose, capillary     Status: Abnormal   Collection Time: 06/10/21  4:25 PM  Result Value Ref Range   Glucose-Capillary 162 (H) 70 - 99 mg/dL  Glucose, capillary     Status: Abnormal   Collection Time: 06/10/21  8:44 PM  Result Value Ref Range   Glucose-Capillary 217 (H) 70 - 99 mg/dL  CBC     Status: Abnormal   Collection Time: 06/11/21  2:24 AM  Result Value Ref Range   WBC 12.3 (H) 4.0 - 10.5 K/uL   RBC 2.62 (L) 4.22 - 5.81 MIL/uL   Hemoglobin 8.4 (L) 13.0 - 17.0 g/dL   HCT 25.4 (L) 39.0 - 52.0 %   MCV 96.9 80.0 - 100.0 fL   MCH 32.1 26.0 - 34.0 pg   MCHC 33.1 30.0 - 36.0 g/dL   RDW 19.1 (H) 11.5 - 15.5 %   Platelets 337 150 - 400 K/uL   nRBC 0.0 0.0 - 0.2 %     Donney Dice, DO 06/11/2021, 2:24 AM PGY-2, [place] Intern pager: [phone], text pages welcome

## 2021-06-11 NOTE — Progress Notes (Signed)
FPTS Interim Progress Note  S: Patient resting in bed comfortably.   O: BP 108/60 (BP Location: Left Arm)   Pulse 90   Temp 98.4 F (36.9 C) (Oral)   Resp 15   Ht 5\' 6"  (1.676 m)   Wt 56.7 kg   SpO2 100%   BMI 20.18 kg/m   General: Sleeping comfortably in bed, in no acute distress. Resp: normal work of breathing   A/P: -Vitals and chart reviewed, continue current plan in place per day team note.  Donney Dice, DO 06/11/2021, 2:04 AM PGY-2, [place] Medicine Service pager [phone]

## 2021-06-12 DIAGNOSIS — N186 End stage renal disease: Secondary | ICD-10-CM | POA: Diagnosis not present

## 2021-06-12 DIAGNOSIS — Z89512 Acquired absence of left leg below knee: Secondary | ICD-10-CM | POA: Diagnosis not present

## 2021-06-12 DIAGNOSIS — E1122 Type 2 diabetes mellitus with diabetic chronic kidney disease: Secondary | ICD-10-CM | POA: Diagnosis not present

## 2021-06-12 DIAGNOSIS — Z992 Dependence on renal dialysis: Secondary | ICD-10-CM | POA: Diagnosis not present

## 2021-06-12 LAB — RENAL FUNCTION PANEL
Albumin: 2.2 g/dL — ABNORMAL LOW (ref 3.5–5.0)
Anion gap: 12 (ref 5–15)
BUN: 57 mg/dL — ABNORMAL HIGH (ref 8–23)
CO2: 27 mmol/L (ref 22–32)
Calcium: 8.5 mg/dL — ABNORMAL LOW (ref 8.9–10.3)
Chloride: 95 mmol/L — ABNORMAL LOW (ref 98–111)
Creatinine, Ser: 7.17 mg/dL — ABNORMAL HIGH (ref 0.61–1.24)
GFR, Estimated: 8 mL/min — ABNORMAL LOW (ref >60)
Glucose, Bld: 178 mg/dL — ABNORMAL HIGH (ref 70–99)
Phosphorus: 4 mg/dL (ref 2.5–4.6)
Potassium: 4.8 mmol/L (ref 3.5–5.1)
Sodium: 134 mmol/L — ABNORMAL LOW (ref 135–145)

## 2021-06-12 LAB — GLUCOSE, CAPILLARY
Glucose-Capillary: 374 mg/dL — ABNORMAL HIGH (ref 70–99)
Glucose-Capillary: 164 mg/dL — ABNORMAL HIGH (ref 70–99)
Glucose-Capillary: 163 mg/dL — ABNORMAL HIGH (ref 70–99)
Glucose-Capillary: 87 mg/dL (ref 70–99)
Glucose-Capillary: 211 mg/dL — ABNORMAL HIGH (ref 70–99)

## 2021-06-12 LAB — BPAM RBC
Blood Product Expiration Date: 202209242359
Blood Product Expiration Date: 202209262359
ISSUE DATE / TIME: 202208261715
Unit Type and Rh: 5100
Unit Type and Rh: 5100

## 2021-06-12 LAB — CBC
HCT: 24.6 % — ABNORMAL LOW (ref 39.0–52.0)
Hemoglobin: 8 g/dL — ABNORMAL LOW (ref 13.0–17.0)
MCH: 31.6 pg (ref 26.0–34.0)
MCHC: 32.5 g/dL (ref 30.0–36.0)
MCV: 97.2 fL (ref 80.0–100.0)
Platelets: 377 10*3/uL (ref 150–400)
RBC: 2.53 MIL/uL — ABNORMAL LOW (ref 4.22–5.81)
RDW: 18.2 % — ABNORMAL HIGH (ref 11.5–15.5)
WBC: 10.7 10*3/uL — ABNORMAL HIGH (ref 4.0–10.5)
nRBC: 0.3 % — ABNORMAL HIGH (ref 0.0–0.2)

## 2021-06-12 LAB — TYPE AND SCREEN
ABO/RH(D): O POS
Antibody Screen: POSITIVE
DAT, IgG: POSITIVE
Donor AG Type: NEGATIVE
Donor AG Type: NEGATIVE
Unit division: 0
Unit division: 0

## 2021-06-12 LAB — SARS CORONAVIRUS 2 (TAT 6-24 HRS): SARS Coronavirus 2: NEGATIVE

## 2021-06-12 LAB — SURGICAL PATHOLOGY

## 2021-06-12 MED ORDER — SENNA 8.6 MG PO TABS
2.0000 | ORAL_TABLET | Freq: Two times a day (BID) | ORAL | Status: DC
  Administered 2021-06-12: 17.2 mg via ORAL
  Filled 2021-06-12: qty 2

## 2021-06-12 NOTE — Progress Notes (Addendum)
Family Medicine Teaching Service Daily Progress Note Intern Pager: [phone]  Patient name: Roger Vazquez Medical record number: [id number] Date of birth: [birth year] Age: 70 y.o. Gender: male  Primary Care Provider: [facility] [place] [facility] Consultants: Vascular Surgery Code Status: Full  Pt Overview and Major Events to Date:  8/25 Admitted   Assessment and Plan:  Roger Vazquez is a 70 yo male presenting with left foot gangrene that required left BKA. PMHx significant for ESRD on HD MWF, h/o lung cancer, PAD, HTN, DM2, polysubstance abuse   Left foot gangrene s/p left BKA Post op day #4 and stable. Remains afebrile in this admission and no signs of infection. WBC 10.7 -Pain control regimen: Tylenol 500mg  q6hr and oxycodone 5 mg q6hr pRN -Plavix restarted with stable hgb -PT/OT following  -Afebrile, blood cultures and cxr if fever occurs  -dysphagia 3 diet  -pending SNF -Per VS, D/C vac and compression sock in place   Acute blood loss anemia 2/2 L BKA in setting of anemia of chronic disease  Hgb 8.4>8.7>8 with no active signs of bleeding  -Transfusion threshold 8 -Plavix and heparin restarted and will continue to monitor Hgb  ESRD on HD MWF HD. Updated Cr and GFR-7.17 and 8 -Nephro following  -Cont phoslo and renal vitamin  -Avoid nephrotoxic agents   DM2 CBGs of late 164>163>87 -sSSI -Monitor regular CBGs  H/O PVD -Plavix restarted   H/O delirium -delirium precautions  FEN/GI: dysphagia three diet  PPx: heparin Dispo:Stable for SNF placement  Subjective:  Patient denies any complaints at this time.  His pain is well controlled.  Patient denies shortness of breath, chest pain, abdominal pain.  But he does endorse some dizziness and lightheadedness that has been present for a few days now.  Objective: Temp:  [97.7 F (36.5 C)-98.6 F (37 C)] 97.8 F (36.6 C) (08/29 0700) Pulse Rate:  [67-83] 67 (08/29 0700) Resp:  [16-18] 16 (08/29 0700) BP:  (132-162)/(62-67) 151/66 (08/29 0700) SpO2:  [100 %] 100 % (08/29 0700) Physical Exam Constitutional:      General: He is not in acute distress.    Appearance: Normal appearance. He is not ill-appearing.  Cardiovascular:     Rate and Rhythm: Normal rate and regular rhythm.     Heart sounds: No murmur heard. Pulmonary:     Effort: Pulmonary effort is normal. No respiratory distress.     Breath sounds: Normal breath sounds.  Abdominal:     General: Bowel sounds are normal. There is no distension.     Palpations: Abdomen is soft.     Tenderness: There is no abdominal tenderness.  Skin:    General: Skin is warm and dry.     Comments: Patient has a stump protector on left lower extremity.  SCD on right lower extremity with pulses palpable on right lower extremity.   Neurological:     Mental Status: He is alert.     Laboratory: Recent Labs  Lab 06/11/21 0224 06/11/21 1237 06/12/21 0340  WBC 12.3* 12.8* 10.7*  HGB 8.4* 8.7* 8.0*  HCT 25.4* 26.5* 24.6*  PLT 337 390 377   Recent Labs  Lab 06/08/21 1051 06/09/21 0336 06/11/21 0224 06/11/21 1237 06/12/21 0340  NA 138   < > 134* 135 134*  K 4.1   < > 4.2 4.2 4.8  CL 92*   < > 94* 95* 95*  CO2 26   < > 29 27 27   BUN 29*   < > 44* 47* 57*  CREATININE 6.37*   < > 6.02* 6.49* 7.17*  CALCIUM 9.3   < > 8.2* 8.7* 8.5*  PROT 8.7*  --   --   --   --   BILITOT 1.3*  --   --   --   --   ALKPHOS 91  --   --   --   --   ALT 19  --   --   --   --   AST 36  --   --   --   --   GLUCOSE 161*   < > 233* 101* 178*   < > = values in this interval not displayed.      [name], MD 06/12/2021, 2:45 PM PGY-1, [place] Intern pager: [phone], text pages welcome

## 2021-06-12 NOTE — Plan of Care (Signed)

## 2021-06-12 NOTE — Hospital Course (Addendum)
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  Past medical history significant for ESRD on HD Monday Wednesday Friday, history of lung cancer, PAD, hypertension, DM2, polysubstance abuse  Left foot gangrene s/p left BKA On 8/23, pt was [facility] from SNF for evaluation of left transmetatarsal amputation, was not healing and was gangrenous.  Due to this patient had left BKA performed on 06/08/2021.  Pain was well controlled ultimately with Tylenol and oxycodone.   Acute blood loss anemia 2/2 to L BKA in setting of anemia of chronic disease Patient's hemoglobin 7 > 6.7 and then received 1 unit PRBCs.  Updated H&H showed 8/24.2.  Patient did have lightheadedness and dizziness, unsure of the origin of the symptoms.  However there were no signs of active bleeding.  We were able to restart heparin and Plavix due to PVD and risk of clots.  His hemoglobin was 8.2 on the day of discharge.  All other diseases are chronic and stable  PCP Issues for Follow Up  Recommend checking a CBC within the next week for anemia during hospitalization requiring blood transfusion.  Follow-up with vascular surgery s/p BKA

## 2021-06-12 NOTE — Progress Notes (Signed)
FPTS Brief Progress Note  S Saw patient at bedside this evening. Rounded with primary night RN who voiced no concerns. Patient was resting comfortably in bed. Says he is short of breath and having pain "where they cut my leg off." No chest pain or other complaints.    O: BP 126/64 (BP Location: Left Arm)   Pulse 76   Temp 98.2 F (36.8 C) (Oral)   Resp 18   Ht 5\' 6"  (1.676 m)   Wt 56.7 kg   SpO2 100%   BMI 20.18 kg/m    General: Alert, no acute distress, stoic Cardio: Normal S1 and S2, RRR Pulm: CTAB, normal work of breathing, able to speak in full sentences, on 2L [place] Abdomen: Bowel sounds normal. Abdomen soft and non-tender.  Extremities: No peripheral edema. Left leg with stump protector. SCD on right lower extremity.  A/P: Plan per day team  - Orders reviewed. Labs for AM ordered, which was adjusted as needed.    Orvis Brill, DO 06/12/2021, 8:10 PM PGY-1, [place] [facility] Night Resident  Please page [phone] with questions.

## 2021-06-12 NOTE — Progress Notes (Signed)
Family Medicine Teaching Service Daily Progress Note Intern Pager: [phone]  Patient name: Roger Vazquez Medical record number: [id number] Date of birth: 12/23/1950 Age: 70 y.o. Gender: male  Primary Care Provider: [facility] [place] [facility] Consultants: Vascular surgery Code Status: Full  Pt Overview and Major Events to Date:  8/25 admitted  Assessment and Plan:  Roger Vazquez is a pleasant 70 year old male presenting with left foot gangrene that required a left BKA he is postop day 5 past medical history is significant for ESRD on HD Monday Wednesday Friday, history of lung cancer, PAD, hypertension, DM2, polysubstance abuse  Left foot gangrene status post left BKA Postop day #5 and stable.  Remains afebrile on this admission and there are no current signs of infection.  White blood cell count 10.7 >11.2 -Pain control regimen: Tylenol 500 mg every 6 hours and oxycodone 5 mg every 6 hours as needed -Plavix has been restarted with a stable hemoglobin -PT and OT are following -Afebrile-blood cultures and chest x-ray if fever occurs -Dysphagia diet 3 -Pending SNF placement-Will have PT/Ot evaluate and treat -Vascular surgery has DC'd the [facility] and a compression sock in place  Acute blood loss anemia secondary to left BKA in setting of anemia of chronic disease -Hemoglobin trend 8.4 > 8.7 > 8 >8.2 with no active signs of bleeding -Transfusion threshold 8 -Plavix and heparin restarted we will continue to monitor hemoglobin -Repeat CBC tomorrow   ESRD on HD Monday Wednesday Friday.  Updated labs: Cr 8.42, GFR 6 from 8 -Nephro is following -Avoid nephrotoxic agents -HD MWF, Needs RFP prior    DM2 CBGs 87>211>142 Sensitive sliding scale insulin   History of PVD On plavix and heparin   FEN/GI: Dysphagia 3 diet  PPx: heparin Dispo:Stable for SNF  Subjective:  Patient reports that he is doing well today.  Still has lightheadedness and dizziness.  Reports that his pain is well  controlled.  Objective: Temp:  [97.8 F (36.6 C)-98.5 F (36.9 C)] 98.5 F (36.9 C) (08/30 0700) Pulse Rate:  [71-83] 78 (08/30 0700) Resp:  [16-20] 18 (08/30 0700) BP: (97-151)/(44-84) 150/84 (08/30 0700) SpO2:  [100 %] 100 % (08/30 0700) Weight:  [57 kg-58.4 kg] 57 kg (08/30 0258) Physical Exam Vitals reviewed.  Constitutional:      General: He is not in acute distress.    Appearance: Normal appearance. He is not ill-appearing.  Cardiovascular:     Rate and Rhythm: Normal rate and regular rhythm.  Pulmonary:     Effort: Pulmonary effort is normal.     Breath sounds: Normal breath sounds.  Abdominal:     General: Bowel sounds are normal.     Palpations: Abdomen is soft.     Tenderness: There is no abdominal tenderness.  Musculoskeletal:     Comments: S/p left BKA with stump protector present. R leg has SCDs with normal pulses on right   Neurological:     Mental Status: He is alert.     Laboratory: Recent Labs  Lab 06/11/21 1237 06/12/21 0340 06/13/21 0004  WBC 12.8* 10.7* 11.2*  HGB 8.7* 8.0* 8.2*  HCT 26.5* 24.6* 24.8*  PLT 390 377 408*   Recent Labs  Lab 06/08/21 1051 06/09/21 0336 06/11/21 1237 06/12/21 0340 06/13/21 0004  NA 138   < > 135 134* 134*  K 4.1   < > 4.2 4.8 4.8  CL 92*   < > 95* 95* 95*  CO2 26   < > 27 27 28   BUN 29*   < >  47* 57* 65*  CREATININE 6.37*   < > 6.49* 7.17* 8.42*  CALCIUM 9.3   < > 8.7* 8.5* 8.5*  PROT 8.7*  --   --   --   --   BILITOT 1.3*  --   --   --   --   ALKPHOS 91  --   --   --   --   ALT 19  --   --   --   --   AST 36  --   --   --   --   GLUCOSE 161*   < > 101* 178* 234*   < > = values in this interval not displayed.      [name], MD 06/13/2021, 1:18 PM PGY-1, [place] Intern pager: [phone], text pages welcome

## 2021-06-12 NOTE — Progress Notes (Addendum)
[facility] [place]  Subjective  - No new complaints, pain well controlled   Objective (!) 162/67 72 97.7 F (36.5 C) (Oral) 17 100%  Intake/Output Summary (Last 24 hours) at 06/12/2021 0753 Last data filed at 06/12/2021 0424 Gross per 24 hour  Intake 120 ml  Output 75 ml  Net 45 ml    Left BKA vac removed, dry dressing applied, compression sock re applied and limb guard Stump appears viable  Assessment/Planning: 06/08/21 Left BKA Vac discontinued, dry guaze with stump compression sock in place. Stump appears viable Order placed to wash the stump after HD to remove significant sticky residue from stump after removing wound vac.   F/U with vascular 4 weeks from surgery appt. Sent to office.  [name] 06/12/2021 7:53 AM --  Laboratory Lab Results: Recent Labs    06/11/21 1237 06/12/21 0340  WBC 12.8* 10.7*  HGB 8.7* 8.0*  HCT 26.5* 24.6*  PLT 390 377   BMET Recent Labs    06/11/21 0224 06/11/21 1237  NA 134* 135  K 4.2 4.2  CL 94* 95*  CO2 29 27  GLUCOSE 233* 101*  BUN 44* 47*  CREATININE 6.02* 6.49*  CALCIUM 8.2* 8.7*    COAG Lab Results  Component Value Date   INR 1.1 06/08/2021   INR 1.1 05/18/2021   INR 1.0 04/27/2021   No results found for: PTT  VASCULAR STAFF ADDENDUM: I have independently interviewed and examined the patient. I agree with the above.  Continue local care to the stump. Please call for questions.  [name]. Stanford Breed, MD [facility] Phone Number: [phone] 06/12/2021 11:58 AM

## 2021-06-12 NOTE — TOC Progression Note (Signed)
Transition of Care [facility]) - Progression Note    Patient Details  Name: Roger Vazquez MRN: [id number] Date of Birth: [birth year]  Transition of Care [facility] CM/SW Contact  Milinda Antis, [place] Phone Number: 06/12/2021, 11:36 AM  Clinical Narrative:    CSW was contacted by the resident MD and informed the the patient's wound vac has been removed.  CSW contacted the facility and spoke with Narda Rutherford who reported that the can accept the patient pending insurance authorization.  CSW was informed that a PT and OT evaluation would need to take place within 24 hours so that the facility can start insurance authorization.  CSW informed the resident MD of this and was informed that an order was placed.  CSW contacted the patient's sister and confirmed that the family would like for the patient to return to [facility].  Pending: Updated PT/ OT evaluation and insurance authorization.          Expected Discharge Plan and Services                                                 Social Determinants of Health (SDOH) Interventions    Readmission Risk Interventions Readmission Risk Prevention Plan 05/30/2021  Transportation Screening Complete  SW Recovery Care/Counseling Consult Complete  Skilled Nursing Facility Complete  Some recent data might be hidden

## 2021-06-12 NOTE — Progress Notes (Signed)
Subjective:  no cos for hd today   Objective Vital signs in last 24 hours: Vitals:   06/11/21 1538 06/11/21 2037 06/12/21 0417 06/12/21 0700  BP: 132/64 140/62 (!) 162/67 (!) 151/66  Pulse: 78 83 72 67  Resp: 18 18 17 16   Temp: 98.6 F (37 C) 98.1 F (36.7 C) 97.7 F (36.5 C) 97.8 F (36.6 C)  TempSrc: Oral  Oral Oral  SpO2: 100% 100% 100% 100%  Weight:      Height:       Weight change:  Physical Exam General:Alert male, NAD chronically ill appearing  Heart:RRR, no mrg Lungs:CTAB, nml WOB on RA Abdomen:NABS,soft, NTND Extremities:no LE edema on R, L BKA in  dressing /cannister Dialysis Access: RU AVG +b/t   OP Dialysis Orders: MWF - [facility]  3hrs 28min, BFR 400, DFR AF 1.5,  EDW 58kg, 2K/ 2Ca Access: RU AVG  Heparin 2000 Mircera 50 mcg q2wks - last 95mcg on 7/27 Hectorol 30mcg IV qHD     Problem/Plan:  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 Luan Pulling. ABX given. Per VVS/primary  ESRD -  on HD MWF.  Next HD Today on schedule   Hypertension/volume  - BP ok without medications. Does not appear grossly volume overloaded.  UF as tolerated.   Anemia of CKD -Hgb drop post surgery to 6.7, s/p 1 unit pRBC with HD on 8/26.  Hgb  8.0 today.  Aranesp 155mcg SQ given 8/26  Check iron studies.   Secondary Hyperparathyroidism -  corrected Ca and phos in goal.  Resume VDRA, follow labs. May need to change binders from Calcium based binder if  hypercalcemia reoccurs.   Nutrition - on dysphagia diet.  Needs fluid restrictions.  DMT2- per admit   Ernest Haber, PA-C [facility] Beeper [phone] 06/12/2021,11:06 AM  LOS: 4 days   Labs: Basic Metabolic Panel: Recent Labs  Lab 06/11/21 0224 06/11/21 1237 06/12/21 0340  NA 134* 135 134*  K 4.2 4.2 4.8  CL 94* 95* 95*  CO2 29 27 27   GLUCOSE 233* 101* 178*  BUN 44* 47* 57*  CREATININE 6.02* 6.49* 7.17*  CALCIUM 8.2* 8.7* 8.5*  PHOS 2.8 3.4 4.0   Liver Function Tests: Recent Labs  Lab  06/08/21 1051 06/09/21 0336 06/11/21 0224 06/11/21 1237 06/12/21 0340  AST 36  --   --   --   --   ALT 19  --   --   --   --   ALKPHOS 91  --   --   --   --   BILITOT 1.3*  --   --   --   --   PROT 8.7*  --   --   --   --   ALBUMIN 2.9*   < > 2.2* 2.4* 2.2*   < > = values in this interval not displayed.   No results for input(s): LIPASE, AMYLASE in the last 168 hours. No results for input(s): AMMONIA in the last 168 hours. CBC: Recent Labs  Lab 06/08/21 1051 06/09/21 0336 06/09/21 1526 06/11/21 0224 06/11/21 1237 06/12/21 0340  WBC 19.9* 15.9*  --  12.3* 12.8* 10.7*  HGB 9.0* 7.0*   < > 8.4* 8.7* 8.0*  HCT 28.4* 22.1*   < > 25.4* 26.5* 24.6*  MCV 104.8* 103.8*  --  96.9 96.4 97.2  PLT 480* 405*  --  337 390 377   < > = values in this interval not displayed.  Cardiac Enzymes: No results for input(s): CKTOTAL, CKMB, CKMBINDEX, TROPONINI in the last 168 hours. CBG: Recent Labs  Lab 06/11/21 0608 06/11/21 1241 06/11/21 1645 06/11/21 2101 06/12/21 0608  GLUCAP 194* 101* 221* 157* 164*    Studies/Results: No results found. Medications:   acetaminophen  500 mg Oral Q6H   aspirin  81 mg Oral Daily   calcium acetate  667 mg Oral TID WC   Chlorhexidine Gluconate Cloth  6 each Topical Q0600   clopidogrel  75 mg Oral Daily   doxercalciferol  2 mcg Intravenous Q M,W,F-HD   heparin  5,000 Units Subcutaneous Q8H   insulin aspart  0-9 Units Subcutaneous TID WC   multivitamin with minerals  1 tablet Oral Daily   polyethylene glycol  17 g Oral BID   senna  1 tablet Oral Daily

## 2021-06-13 DIAGNOSIS — I739 Peripheral vascular disease, unspecified: Secondary | ICD-10-CM

## 2021-06-13 DIAGNOSIS — Z992 Dependence on renal dialysis: Secondary | ICD-10-CM | POA: Diagnosis not present

## 2021-06-13 DIAGNOSIS — Z89512 Acquired absence of left leg below knee: Secondary | ICD-10-CM | POA: Diagnosis not present

## 2021-06-13 DIAGNOSIS — N186 End stage renal disease: Secondary | ICD-10-CM | POA: Diagnosis not present

## 2021-06-13 LAB — CBC
HCT: 24.8 % — ABNORMAL LOW (ref 39.0–52.0)
Hemoglobin: 8.2 g/dL — ABNORMAL LOW (ref 13.0–17.0)
MCH: 32.7 pg (ref 26.0–34.0)
MCHC: 33.1 g/dL (ref 30.0–36.0)
MCV: 98.8 fL (ref 80.0–100.0)
Platelets: 408 10*3/uL — ABNORMAL HIGH (ref 150–400)
RBC: 2.51 MIL/uL — ABNORMAL LOW (ref 4.22–5.81)
RDW: 17.9 % — ABNORMAL HIGH (ref 11.5–15.5)
WBC: 11.2 10*3/uL — ABNORMAL HIGH (ref 4.0–10.5)
nRBC: 0 % (ref 0.0–0.2)

## 2021-06-13 LAB — RENAL FUNCTION PANEL
Albumin: 2.2 g/dL — ABNORMAL LOW (ref 3.5–5.0)
Anion gap: 11 (ref 5–15)
BUN: 65 mg/dL — ABNORMAL HIGH (ref 8–23)
CO2: 28 mmol/L (ref 22–32)
Calcium: 8.5 mg/dL — ABNORMAL LOW (ref 8.9–10.3)
Chloride: 95 mmol/L — ABNORMAL LOW (ref 98–111)
Creatinine, Ser: 8.42 mg/dL — ABNORMAL HIGH (ref 0.61–1.24)
GFR, Estimated: 6 mL/min — ABNORMAL LOW (ref >60)
Glucose, Bld: 234 mg/dL — ABNORMAL HIGH (ref 70–99)
Phosphorus: 4.1 mg/dL (ref 2.5–4.6)
Potassium: 4.8 mmol/L (ref 3.5–5.1)
Sodium: 134 mmol/L — ABNORMAL LOW (ref 135–145)

## 2021-06-13 LAB — GLUCOSE, CAPILLARY
Glucose-Capillary: 142 mg/dL — ABNORMAL HIGH (ref 70–99)
Glucose-Capillary: 204 mg/dL — ABNORMAL HIGH (ref 70–99)
Glucose-Capillary: 108 mg/dL — ABNORMAL HIGH (ref 70–99)

## 2021-06-13 MED ORDER — HEPARIN SODIUM (PORCINE) 1000 UNIT/ML DIALYSIS
1000.0000 [IU] | INTRAMUSCULAR | Status: DC | PRN
  Filled 2021-06-13: qty 1

## 2021-06-13 MED ORDER — OXYCODONE HCL 5 MG PO TABS: 5.0000 mg | ORAL_TABLET | Freq: Four times a day (QID) | ORAL | 0 refills | Status: AC | PRN

## 2021-06-13 MED ORDER — SODIUM CHLORIDE 0.9 % IV SOLN: 100.0000 mL | INTRAVENOUS | Status: DC | PRN

## 2021-06-13 MED ORDER — LIDOCAINE HCL (PF) 1 % IJ SOLN
5.0000 mL | INTRAMUSCULAR | Status: DC | PRN
  Filled 2021-06-13: qty 5

## 2021-06-13 MED ORDER — PENTAFLUOROPROP-TETRAFLUOROETH EX AERO: 1.0000 "application " | INHALATION_SPRAY | CUTANEOUS | Status: DC | PRN

## 2021-06-13 MED ORDER — ALTEPLASE 2 MG IJ SOLR
2.0000 mg | Freq: Once | INTRAMUSCULAR | Status: DC | PRN
  Filled 2021-06-13: qty 2

## 2021-06-13 MED ORDER — NEPRO/CARBSTEADY PO LIQD
237.0000 mL | Freq: Three times a day (TID) | ORAL | Status: DC
  Administered 2021-06-13: 237 mL via ORAL

## 2021-06-13 MED ORDER — LIDOCAINE-PRILOCAINE 2.5-2.5 % EX CREA
1.0000 "application " | TOPICAL_CREAM | CUTANEOUS | Status: DC | PRN
  Filled 2021-06-13: qty 5

## 2021-06-13 NOTE — Discharge Instructions (Addendum)
Dear Dena Billet,   Thank you so much for allowing [place] to be part of your care!  You were admitted to [facility] for an infection requiring amputation of the left foot.   DOCTOR'S APPOINTMENT & FOLLOW UP CARE INSTRUCTIONS  Future Appointments  Date Time Provider [place]  07/11/2021  1:30 PM [facility]    RETURN PRECAUTIONS: If you experience pain or redness at the site of amputation or fever.  Take care and be well!  [facility]  [place], [place] [address] [phone]

## 2021-06-13 NOTE — TOC Transition Note (Signed)
Transition of Care [facility]) - CM/SW Discharge Note   Patient Details  Name: Roger Vazquez MRN: [id number] Date of Birth: [birth year]  [facility] CM/SW Contact:  Milinda Antis, [place] Phone Number: 06/13/2021, 2:43 PM   Clinical Narrative:    Patient will DC to: [facility] SNF Anticipated DC date: 06/13/2021 Family notified: Yes Transport by: Corey Harold   Per MD patient ready for DC to SNF. RN to call report prior to discharge [phone]. RN, patient, patient's family, and facility notified of DC. Discharge Summary and FL2 sent to facility. DC packet on chart. Ambulance transport will be requested for patient when RN reports that the patient is ready.   CSW will sign off for now as social work intervention is no longer needed. Please consult [place] again if new needs arise.     Final next level of care: [place]     Patient Goals and CMS Choice        Discharge Placement              Patient chooses bed at: [facility] Patient to be transferred to facility by: [place] Name of family member notified: [name] Patient and family notified of of transfer: 06/13/21  Discharge Plan and Services                                     Social Determinants of Health (SDOH) Interventions     Readmission Risk Interventions Readmission Risk Prevention Plan 05/30/2021  Transportation Screening Complete  SW Recovery Care/Counseling Consult Complete  Skilled Nursing Facility Complete  Some recent data might be hidden

## 2021-06-13 NOTE — Progress Notes (Signed)
Subjective: Seen in room up in chair said tolerated dialysis yesterday.  Currently no complaint  Objective Vital signs in last 24 hours: Vitals:   06/13/21 0230 06/13/21 0251 06/13/21 0258 06/13/21 0700  BP: (!) 105/44 (!) 97/45 134/64 (!) 150/84  Pulse: 72 75 75 78  Resp:  16 20 18   Temp:   97.8 F (36.6 C) 98.5 F (36.9 C)  TempSrc:   Oral Oral  SpO2:    100%  Weight:   57 kg   Height:       Weight change:   Physical Exam General:Alert male, NAD chronically ill appearing  Heart:RRR, no mrg Lungs:CTAB, nml WOB on RA Abdomen:NABS,soft, NTND Extremities:no LE edema on R, L BKA in  dressing /cannister Dialysis Access: RU A AVG positive bruit   OP Dialysis Orders: MWF - [facility]  3hrs 61min, BFR 400, DFR AF 1.5,  EDW 58kg, 2K/ 2Ca Access: RU AVG  Heparin 2000 Mircera 50 mcg q2wks - last 43mcg on 7/27 Hectorol 52mcg IV qHD     Problem/Plan:  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 Luan Pulling. ABX given. Per VVS/primary  ESRD -  on HD MWF.  Next HD 8/31 on schedule   Hypertension/volume  - BP on low side slightly without medications.  Possible pain med effect /does not appear grossly volume overloaded.  500 cc UF yesterday  Plan to keep even tomorrow dialysis  Anemia of CKD -Hgb drop post surgery to 6.7, s/p 1 unit pRBC with HD on 8/26.  Hgb  8.0 > 8.2 today.  Aranesp 130mcg SQ given 8/26  TFS 27% with ferritin 2061-thus  no iron   Secondary Hyperparathyroidism -  corrected Ca 9.9 and phos in goal.  Resume VDRA, follow labs. May need to change binders from Calcium based binder if  hypercalcemia reoccurs.   Nutrition - on dysphagia diet.  Needs fluid restrictions.  ALB 2.2 add Nepro 3 times daily DMT2- per admit  Ernest Haber, PA-C [facility] Beeper [phone] 06/13/2021,12:05 PM  LOS: 5 days   Labs: Basic Metabolic Panel: Recent Labs  Lab 06/11/21 1237 06/12/21 0340 06/13/21 0004  NA 135 134* 134*  K 4.2 4.8 4.8  CL 95* 95* 95*  CO2 27  27 28   GLUCOSE 101* 178* 234*  BUN 47* 57* 65*  CREATININE 6.49* 7.17* 8.42*  CALCIUM 8.7* 8.5* 8.5*  PHOS 3.4 4.0 4.1   Liver Function Tests: Recent Labs  Lab 06/08/21 1051 06/09/21 0336 06/11/21 1237 06/12/21 0340 06/13/21 0004  AST 36  --   --   --   --   ALT 19  --   --   --   --   ALKPHOS 91  --   --   --   --   BILITOT 1.3*  --   --   --   --   PROT 8.7*  --   --   --   --   ALBUMIN 2.9*   < > 2.4* 2.2* 2.2*   < > = values in this interval not displayed.   No results for input(s): LIPASE, AMYLASE in the last 168 hours. No results for input(s): AMMONIA in the last 168 hours. CBC: Recent Labs  Lab 06/09/21 0336 06/09/21 1526 06/11/21 0224 06/11/21 1237 06/12/21 0340 06/13/21 0004  WBC 15.9*  --  12.3* 12.8* 10.7* 11.2*  HGB 7.0*   < > 8.4* 8.7* 8.0* 8.2*  HCT 22.1*   < > 25.4*  26.5* 24.6* 24.8*  MCV 103.8*  --  96.9 96.4 97.2 98.8  PLT 405*  --  337 390 377 408*   < > = values in this interval not displayed.   Cardiac Enzymes: No results for input(s): CKTOTAL, CKMB, CKMBINDEX, TROPONINI in the last 168 hours. CBG: Recent Labs  Lab 06/12/21 1146 06/12/21 1535 06/12/21 2031 06/13/21 0623 06/13/21 1149  GLUCAP 163* 87 211* 142* 204*    Studies/Results: No results found. Medications:  sodium chloride     sodium chloride      acetaminophen  500 mg Oral Q6H   aspirin  81 mg Oral Daily   calcium acetate  667 mg Oral TID WC   Chlorhexidine Gluconate Cloth  6 each Topical Q0600   clopidogrel  75 mg Oral Daily   doxercalciferol  2 mcg Intravenous Q M,W,F-HD   heparin  5,000 Units Subcutaneous Q8H   insulin aspart  0-9 Units Subcutaneous TID WC   multivitamin with minerals  1 tablet Oral Daily   polyethylene glycol  17 g Oral BID   senna  2 tablet Oral BID

## 2021-06-13 NOTE — Progress Notes (Signed)
[facility] arrived to take patient to facility. PIV removed. D/C packet and report given to PTAR. Sister Abhay Godbolt notified of patient d/c and that wheelchair needs to be picked up. [name] states she will be by tomorrow to pick up wheelchair.

## 2021-06-13 NOTE — Progress Notes (Signed)
Report called to facility and given to [facility], LPN.

## 2021-06-13 NOTE — Progress Notes (Signed)
Attempt to call report to facility and nurse busy at this time. Left call back number.

## 2021-06-13 NOTE — Progress Notes (Signed)
Physical Therapy Treatment Patient Details Name: Roger Vazquez MRN: [id number] DOB: [birth year] Today's Date: 06/13/2021    History of Present Illness Pt is a 71 y.o. 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 PMH is significant for ESRD on HD MWF, hx of lung CA, PAD, HTN, DM2, polysubstance abuse.    PT Comments    Pt supine in bed on arrival.  Focus on OOB to chair with use of sara stedy sit to to stand device.  He was more successful pulling into standing with this device.  Pt very painful post session.  He continues to benefit from rehab in a post acute setting to improve strength, function and balance post new amputation.    Follow Up Recommendations  SNF;Supervision/Assistance - 24 hour     Equipment Recommendations  None recommended by PT (TBA)    Recommendations for Other Services       Precautions / Restrictions Precautions Precautions: Fall Precaution Comments: L BKA / wound vac d/c 06/12/21 Required Braces or Orthoses: Other Brace Other Brace: L limb guard. Restrictions Weight Bearing Restrictions: Yes LLE Weight Bearing: Non weight bearing Other Position/Activity Restrictions: L limb guard    Mobility  Bed Mobility Overal bed mobility: Needs Assistance Bed Mobility: Supine to Sit   Sidelying to sit: Min assist;HOB elevated       General bed mobility comments: Pt used PTA as railing to pull trunk into sitting.  Min assistance to scoot to edge of bed with use of bed bad.    Transfers Overall transfer level: Needs assistance Equipment used:  (sara stedy) Transfers: Sit to/from Stand Sit to Stand: Mod assist;From elevated surface         General transfer comment: Cues for hand placement to and from seated surface.  pt holding sara stedy cross bar to pull into standing this session.  Cues  for hip extension Bilaterally with mod cues for L hip extension.  Pt with L lateral lean. He performed x2 sit to stand with  emphasis on posture and hip/trunk extension.  Ambulation/Gait Ambulation/Gait assistance:  (unable.)           General Gait Details: unable   Stairs             Wheelchair Mobility    Modified Rankin (Stroke Patients Only)       Balance Overall balance assessment: Needs assistance Sitting-balance support: Bilateral upper extremity supported;Feet supported Sitting balance-Leahy Scale: Poor Sitting balance - Comments: UE support and min guard-minA to sit statically EOB.   Standing balance support: Bilateral upper extremity supported Standing balance-Leahy Scale: Poor                              Cognition Arousal/Alertness: Awake/alert Behavior During Therapy: Flat affect Overall Cognitive Status: Within Functional Limits for tasks assessed                                 General Comments: did not formally assess cognition but he was able to follow all commands this session during transfer training.      Exercises      General Comments        Pertinent Vitals/Pain Pain Assessment: 0-10 Pain Score: 9  Pain Location: L leg  s/p BKA, "all over" Pain Descriptors / Indicators: Discomfort;Grimacing;Guarding;Operative site guarding Pain Intervention(s): Monitored during session;Patient requesting pain meds-RN notified;RN gave pain meds during session    Home Living                      Prior Function            PT Goals (current goals can now be found in the care plan section) Acute Rehab PT Goals Patient Stated Goal: to go back to SNF Potential to Achieve Goals: Good Progress towards PT goals: Progressing toward goals    Frequency    Min 2X/week      PT Plan Current plan remains appropriate    Co-evaluation              AM-PAC PT "6 Clicks" Mobility   Outcome Measure  Help  needed turning from your back to your side while in a flat bed without using bedrails?: A Little Help needed moving from lying on your back to sitting on the side of a flat bed without using bedrails?: A Little   Help needed standing up from a chair using your arms (e.g., wheelchair or bedside chair)?: A Lot Help needed to walk in hospital room?: Total Help needed climbing 3-5 steps with a railing? : Total 6 Click Score: 10    End of Session Equipment Utilized During Treatment: Gait belt Activity Tolerance: Patient tolerated treatment well Patient left: in chair;with chair alarm set;with call bell/phone within reach;with family/visitor present Nurse Communication: Mobility status;Need for lift equipment PT Visit Diagnosis: Other abnormalities of gait and mobility (R26.89);Difficulty in walking, not elsewhere classified (R26.2);Unsteadiness on feet (R26.81);Muscle weakness (generalized) (M62.81);Pain Pain - Right/Left: Left Pain - part of body: Leg     Time: 9798-9211 PT Time Calculation (min) (ACUTE ONLY): 19 min  Charges:  $Therapeutic Activity: 8-22 mins                     Erasmo Leventhal , PTA Acute Rehabilitation Services Pager [phone] Office [phone]    Roger Vazquez 06/13/2021, 10:55 AM

## 2021-06-13 NOTE — Discharge Summary (Addendum)
[facility] Discharge Summary  Patient name: Roger Vazquez Medical record number: [id number] Date of birth: 15-Jun-1951 Age: 70 y.o. Gender: male Date of Admission: 06/08/2021  Date of Discharge: 06/13/2021 Admitting Physician: Orvis Brill, DO  Primary Care Provider: [facility] Consultants: Vascular surgery   Indication for Hospitalization: Full  Discharge Diagnoses/Problem List:  Active Problems:   Type 2 diabetes mellitus with chronic kidney disease on chronic dialysis ([place])   ESRD on hemodialysis ([place])   S/P BKA (below knee amputation), left ([place])   Peripheral vascular disease ([place])   Disposition: SNF  Discharge Condition: Stable  Discharge Exam:  Blood pressure (!) 150/84, pulse 78, temperature 98.5 F (36.9 C), temperature source Oral, resp. rate 18, height 5\' 6"  (1.676 m), weight 57 kg, SpO2 100 %. Physical Exam Vitals reviewed.  Constitutional:      General: He is not in acute distress.    Appearance: Normal appearance. He is not ill-appearing.  Cardiovascular:     Rate and Rhythm: Normal rate and regular rhythm.  Pulmonary:     Effort: Pulmonary effort is normal.     Breath sounds: Normal breath sounds.  Abdominal:     General: Bowel sounds are normal.     Palpations: Abdomen is soft.     Tenderness: There is no abdominal tenderness.  Musculoskeletal:     Comments: S/p left BKA with stump protector present. R leg has SCDs with normal pulses on right   Neurological:     Mental Status: He is alert.   Brief Hospital Course:  Roger Vazquez is a 70 year old male presenting with left foot gangrene status post left BKA.  Past medical history significant for ESRD on HD Monday Wednesday Friday, history of lung cancer, PAD, hypertension, DM2, polysubstance abuse  Left foot gangrene s/p left BKA On 8/23, pt [facility] from SNF for evaluation of left transmetatarsal amputation  with poor healing and was gangrenous.  Due to this patient having left BKA performed on 06/08/2021.  Pain was well controlled with Tylenol and oxycodone.   Acute blood loss anemia 2/2 to L BKA in setting of anemia of chronic disease Patient's hemoglobin 7 > 6.7 and then received 1 unit PRBCs.  Updated H&H showed 8/24.2.  Patient did have lightheadedness and dizziness, unsure of the origin of the symptoms.  However there were no signs of active bleeding.  We were able to restart heparin and Plavix due to hx PVD and risk of clots.  His hemoglobin was 8.2 on the day of discharge.  All other diseases are chronic and stable  PCP Issues for Follow Up  Recommend checking a CBC within the next week for anemia during hospitalization requiring blood transfusion.  Follow-up with vascular surgery s/p BKA   Significant Labs and Imaging:  Recent Labs  Lab 06/11/21 1237 06/12/21 0340 06/13/21 0004  WBC 12.8* 10.7* 11.2*  HGB 8.7* 8.0* 8.2*  HCT 26.5* 24.6* 24.8*  PLT 390 377 408*   Recent Labs  Lab 06/08/21 1051 06/09/21 0336 06/11/21 0224 06/11/21 1237 06/12/21 0340 06/13/21 0004  NA 138 136 134* 135 134* 134*  K 4.1 5.1 4.2 4.2 4.8 4.8  CL 92* 93* 94* 95* 95* 95*  CO2 26 24 29 27 27 28   GLUCOSE 161* 265* 233* 101* 178* 234*  BUN 29* 44* 44* 47* 57* 65*  CREATININE 6.37* 7.51* 6.02* 6.49* 7.17* 8.42*  CALCIUM 9.3 9.0 8.2* 8.7* 8.5* 8.5*  PHOS  --  7.8* 2.8 3.4 4.0 4.1  ALKPHOS 91  --   --   --   --   --   AST 36  --   --   --   --   --   ALT 19  --   --   --   --   --   ALBUMIN 2.9* 2.4* 2.2* 2.4* 2.2* 2.2*       Discharge Medications:  Allergies as of 06/13/2021       Reactions   Sildenafil Other (See Comments)   Pt stated, "Makes my Blood Pressure drop"        Medication List     TAKE these medications    acetaminophen 500 MG tablet Commonly known as: TYLENOL Take 1 tablet (500 mg total) by mouth every 6 (six) hours.   aspirin 81 MG chewable tablet Chew 81 mg by  mouth daily. (0800)   calcium acetate 667 MG capsule Commonly known as: PHOSLO Take 667 mg by mouth 3 (three) times daily with meals. (0800, 1300 & 1700)   clopidogrel 75 MG tablet Commonly known as: PLAVIX Take 1 tablet (75 mg total) by mouth daily.   lidocaine-prilocaine cream Commonly known as: EMLA Apply 1 application topically as needed.   multivitamin with minerals Tabs tablet Take 1 tablet by mouth daily.   oxyCODONE 5 MG immediate release tablet Commonly known as: Oxy IR/ROXICODONE Take 1 tablet (5 mg total) by mouth every 6 (six) hours as needed for up to 5 days for severe pain.   polyethylene glycol 17 g packet Commonly known as: MIRALAX / GLYCOLAX Take 17 g by mouth 2 (two) times daily.   senna 8.6 MG Tabs tablet Commonly known as: SENOKOT Take 1 tablet (8.6 mg total) by mouth daily.        Discharge Instructions: Please refer to Patient Instructions section of EMR for full details.  Patient was counseled important signs and symptoms that should prompt return to medical care, changes in medications, dietary instructions, activity restrictions, and follow up appointments.   Follow-Up Appointments:  Follow-up Information     Cherre Robins, MD Follow up in 3 day(s).   Specialties: Vascular Surgery, Interventional Cardiology Why: Office will call you to arrange your appt (sent) Contact information: [place] [address] [phone]                 [name], MD 06/13/2021, 2:22 PM PGY-1, [place]

## 2021-06-13 NOTE — Progress Notes (Signed)
Occupational Therapy Treatment Patient Details Name: Roger Vazquez MRN: [id number] DOB: [birth year] Today's Date: 06/13/2021    History of present illness Pt is a 70 y.o. 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 PMH is significant for ESRD on HD MWF, hx of lung CA, PAD, HTN, DM2, polysubstance abuse.   OT comments  Pt continues to present with decreased balance, strength, and cognition. Pt performing sit<>stand with Mod A +2 and use of stedy. Pt requiring increased time and cues throughout. Pt completing grooming tasks while seated with set up. Continue to recommend dc to SNF. Will continue to follow acutely as admitted.    Follow Up Recommendations  SNF    Equipment Recommendations  None recommended by OT    Recommendations for Other Services      Precautions / Restrictions Precautions Precautions: Fall Precaution Comments: L BKA / wound vac d/c 06/12/21 Required Braces or Orthoses: Other Brace Other Brace: L limb guard. Restrictions Weight Bearing Restrictions: Yes LLE Weight Bearing: Non weight bearing Other Position/Activity Restrictions: L limb guard       Mobility Bed Mobility Overal bed mobility: Needs Assistance Bed Mobility: Supine to Sit   Sidelying to sit: Min assist;HOB elevated       General bed mobility comments: OOB upon arrival    Transfers Overall transfer level: Needs assistance Equipment used:  (sara stedy) Transfers: Sit to/from Stand Sit to Stand: Mod assist;+2 physical assistance         General transfer comment: Mod A +2 for power up form recliner seat.    Balance Overall balance assessment: Needs assistance Sitting-balance support: Bilateral upper extremity supported;Feet supported Sitting balance-Leahy Scale: Poor Sitting balance - Comments: UE support and min  guard-minA to sit statically EOB.   Standing balance support: Bilateral upper extremity supported Standing balance-Leahy Scale: Poor                             ADL either performed or assessed with clinical judgement   ADL Overall ADL's : Needs assistance/impaired     Grooming: Oral care;Wash/dry face;Set up;Sitting                   Toilet Transfer: Moderate assistance;+2 for physical assistance (sit<>stand with stedy) Toilet Transfer Details (indicate cue type and reason): Mod A +2 for sit<>stand with use of stedy         Functional mobility during ADLs: Moderate assistance;+2 for physical assistance (sit<>stand) General ADL Comments: Pt with decreased balance and strength. Poor cognition impacting safety     Vision       Perception     Praxis      Cognition Arousal/Alertness: Awake/alert Behavior During Therapy: Flat affect Overall Cognitive Status: Impaired/Different from baseline Area of Impairment: Attention;Memory;Following commands;Safety/judgement;Awareness;Problem solving                   Current Attention Level: Sustained Memory: Decreased short-term memory Following Commands: Follows one step commands inconsistently;Follows one step commands with increased time Safety/Judgement: Decreased awareness of safety;Decreased awareness of deficits Awareness: Intellectual Problem Solving: Slow processing;Decreased initiation;Difficulty sequencing;Requires verbal cues;Requires tactile cues General Comments: Pt requiring increased time and cues throughout        Exercises     Shoulder Instructions       General Comments VSS on  RA    Pertinent Vitals/ Pain       Pain Assessment: Faces Pain Score: 9  Faces Pain Scale: Hurts little more Pain Location: L leg s/p BKA, "all over" Pain Descriptors / Indicators: Discomfort;Grimacing;Guarding;Operative site guarding Pain Intervention(s): Limited activity within patient's  tolerance;Monitored during session;Repositioned  Home Living                                          Prior Functioning/Environment              Frequency  Min 2X/week        Progress Toward Goals  OT Goals(current goals can now be found in the care plan section)  Progress towards OT goals: Progressing toward goals  Acute Rehab OT Goals Patient Stated Goal: to go back to SNF OT Goal Formulation: With patient Time For Goal Achievement: 06/23/21 Potential to Achieve Goals: Fair ADL Goals Pt Will Perform Lower Body Bathing: with min assist;sitting/lateral leans Pt Will Perform Lower Body Dressing: with min assist;sitting/lateral leans Pt Will Transfer to Toilet: with min assist;with transfer board Pt Will Perform Toileting - Clothing Manipulation and hygiene: with min assist;sitting/lateral leans Additional ADL Goal #1: Pt will complete bed mobility with min guard assist.  Plan Discharge plan remains appropriate;Frequency remains appropriate    Co-evaluation                 AM-PAC OT "6 Clicks" Daily Activity     Outcome Measure   Help from another person eating meals?: A Little Help from another person taking care of personal grooming?: A Little Help from another person toileting, which includes using toliet, bedpan, or urinal?: A Lot Help from another person bathing (including washing, rinsing, drying)?: A Lot Help from another person to put on and taking off regular upper body clothing?: A Little Help from another person to put on and taking off regular lower body clothing?: A Lot 6 Click Score: 15    End of Session Equipment Utilized During Treatment: Gait belt  OT Visit Diagnosis: Unsteadiness on feet (R26.81);Other abnormalities of gait and mobility (R26.89);Muscle weakness (generalized) (M62.81);Pain Pain - Right/Left: Left Pain - part of body: Leg   Activity Tolerance Patient limited by fatigue;Patient limited by lethargy    Patient Left in chair;with call bell/phone within reach;with chair alarm set;with family/visitor present   Nurse Communication Mobility status;Need for lift equipment        Time: 5638-9373 OT Time Calculation (min): 17 min  Charges: OT General Charges $OT Visit: 1 Visit OT Treatments $Self Care/Home Management : 8-22 mins  [place], OTR/L Acute Rehab Pager: [phone] Office: [place] 06/13/2021, 11:33 AM

## 2021-06-13 NOTE — TOC Progression Note (Signed)
Transition of Care [facility]) - Progression Note    Patient Details  Name: Roger Vazquez MRN: [id number] Date of Birth: 05-Feb-1951  Transition of Care [facility] CM/SW Contact  Milinda Antis, [place] Phone Number: 06/13/2021, 8:48 AM  Clinical Narrative:     CSW contacted PT and OT to request that CSW be notified after they have completed the evaluations with this patient so that CSW can prompt the facility to begin insurance auth.  Pending: updated PT and OT notes; insurance auth.        Expected Discharge Plan and Services                                                 Social Determinants of Health (SDOH) Interventions    Readmission Risk Interventions Readmission Risk Prevention Plan 05/30/2021  Transportation Screening Complete  SW Recovery Care/Counseling Consult Complete  Skilled Nursing Facility Complete  Some recent data might be hidden

## 2021-06-14 NOTE — Care Management Important Message (Signed)
Important Message  Patient Details  Name: Roger Vazquez MRN: [id number] Date of Birth: [birth year]   Medicare Important Message Given:  Yes     [name] [address] 06/14/2021, 8:24 AM

## 2021-07-11 ENCOUNTER — Ambulatory Visit (INDEPENDENT_AMBULATORY_CARE_PROVIDER_SITE_OTHER): Payer: No Typology Code available for payment source | Admitting: Physician Assistant

## 2021-07-11 ENCOUNTER — Encounter: Payer: Self-pay | Admitting: Physician Assistant

## 2021-07-11 ENCOUNTER — Other Ambulatory Visit: Payer: Self-pay

## 2021-07-11 VITALS — BP 190/84 | HR 86 | Temp 98.2°F | Resp 20 | Ht 66.0 in

## 2021-07-11 DIAGNOSIS — Z89512 Acquired absence of left leg below knee: Secondary | ICD-10-CM

## 2021-07-11 NOTE — Progress Notes (Signed)
POST OPERATIVE OFFICE NOTE    CC:  F/u for surgery  HPI:  This is a 70 y.o. male who is s/p left below knee amputation on 06/08/21 by Dr. Stanford Breed. 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 He says he has been doing well since surgery. He currently a resident at [facility]. He says he is doing well in his therapy. He says he is having intermittent pains in left BKA but overall minimal pain. He denies any pain or tissue loss in right lower extremity. He is ESRD with HD per right forearm. He reports this is working well and no issues at dialysis  Allergies  Allergen Reactions   Sildenafil Other (See Comments)    Pt stated, "Makes my Blood Pressure drop"    Current Outpatient Medications  Medication Sig Dispense Refill   acetaminophen (TYLENOL) 500 MG tablet Take 1 tablet (500 mg total) by mouth every 6 (six) hours. 30 tablet 0   aspirin 81 MG chewable tablet Chew 81 mg by mouth daily. (0800)     calcium acetate (PHOSLO) 667 MG capsule Take 667 mg by mouth 3 (three) times daily with meals. (0800, 1300 & 1700)     clopidogrel (PLAVIX) 75 MG tablet Take 1 tablet (75 mg total) by mouth daily. 30 tablet 0   Doxercalciferol (HECTOROL IV) Doxercalciferol (Hectorol)     heparin 1000 unit/mL SOLN injection Heparin Sodium (Porcine) 1,000 Units/mL Systemic     lidocaine-prilocaine (EMLA) cream Apply 1 application topically as needed.     Multiple Vitamin (MULITIVITAMIN WITH MINERALS) TABS Take 1 tablet by mouth daily. 30 tablet 0   polyethylene glycol (MIRALAX / GLYCOLAX) 17 g packet Take 17 g by mouth 2 (two) times daily. 14 each 0   senna (SENOKOT) 8.6 MG TABS tablet Take 1 tablet (8.6 mg total) by mouth daily. 7 tablet 0   No current facility-administered medications for this visit.     ROS:  See HPI  Physical Exam:  Vitals:   07/11/21 1400  BP: (!) 190/84  Pulse: 86  Resp: 20  Temp: 98.2 F (36.8 C)  TempSrc: Temporal  SpO2: 98%  Height: 5\' 6"  (1.676 m)   General:  chronically ill appearing, in no distress Chest: regular rate and rhythm Lungs: non labored Incision:  left BKA well appearing, viable flaps. Staples removed. Steri strips applied Extremities:  well perfused and warm. 2+ femoral pulses bilaterally. Right foot no palpable distal pulses Neuro: alert and oriented Abdomen:  flat, soft, nontender  Assessment/Plan:  This is a 70 y.o. male who is s/p eft below knee amputation on 06/08/21 by Dr. Stanford Breed. Left BKA is healing well. Staples were removed today. Steri strips applied ot the incision. Will make referral to [facility] to start prosthesis process. He will need to be fully healed before weight bearing.   The patient has a left Below Knee Amputation. The patient is well motivated to return to their prior functional status by utilizing a prosthesis to perform ADL's and maintain a healthy lifestyle. The patient has the physical and cognitive capacity to function with a prosthesis.   Functional Level: K2 Limited Community Ambulator: Has the ability or potential for ambulation and to traverse low environmental barriers such as curbs, stairs or uneven surfaces  Residual Limb History: The skin condition of the residual limb is intact and well appearing. The patient will continue to monitor the skin of the residual limb and follow hygiene instructions.  The patient is experiencing residual  limb pain  Prosthetic Prescription Plan: Counseling and education regarding prosthetic management will be provided to the patient via a certified prosthetist. A multi-discipline team, including physical therapy, will manage the prosthetic fabrication, fitting and prosthetic gait training.   - Advised patient to follow up sooner if he has any concerns about adequate healing of Left BKA -I will have him follow up in 3 months with [facility] to follow up on Lluver[place], PA-C [facility] [phone]  Clinic MD:  Roxanne Mins

## 2021-07-13 ENCOUNTER — Other Ambulatory Visit: Payer: Self-pay

## 2021-07-13 DIAGNOSIS — I96 Gangrene, not elsewhere classified: Secondary | ICD-10-CM

## 2021-08-06 IMAGING — CT CT HEAD W/O CM
4 series · 17 of 47 positions shown, 19 images · non-contrast
Comparison: None.

CLINICAL DATA: Head trauma

EXAM:
CT HEAD WITHOUT CONTRAST
TECHNIQUE: Contiguous axial images were obtained from the base of the skull
through the vertex without intravenous contrast.

[Series 2: head wo · axial · 0.41mm/px · z∈[+1470,+1590]mm · 7 of 33 slices shown, 9 images]
[im 5/33  brain]
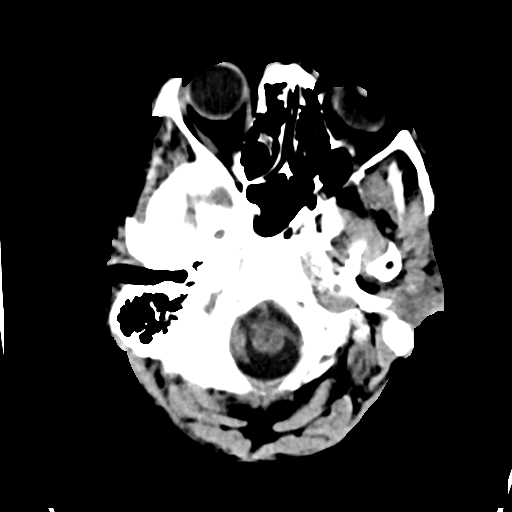
[im 5/33  bone]
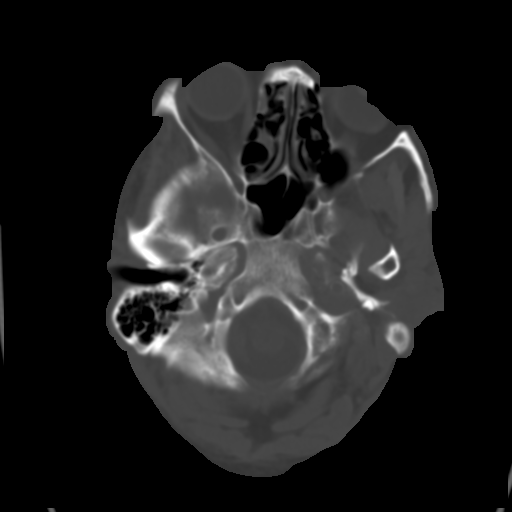
[im 9/33  brain]
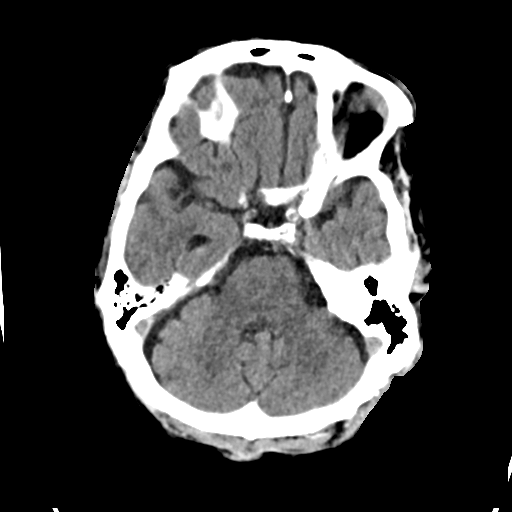
[im 13/33  brain]
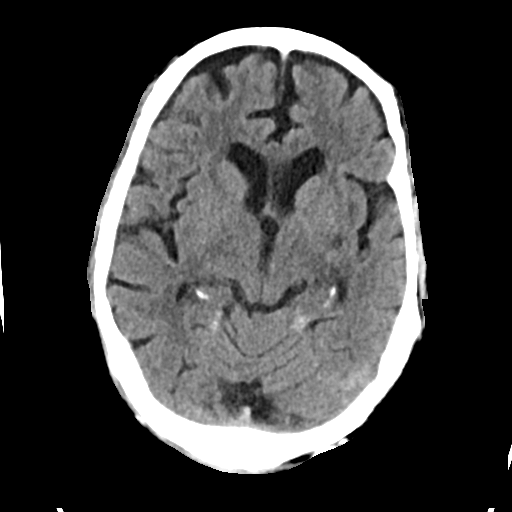
[im 17/33  brain]
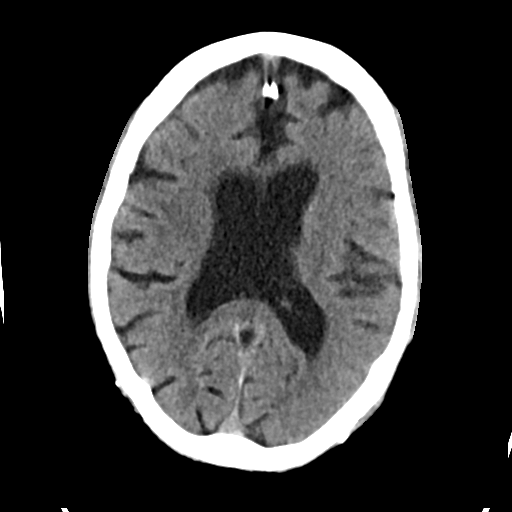
[im 21/33  brain]
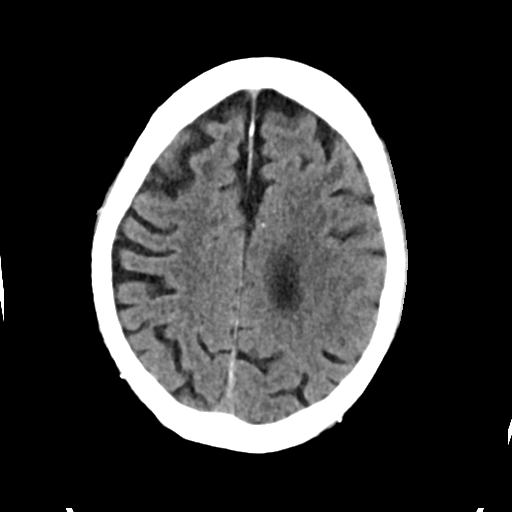
[im 21/33  bone]
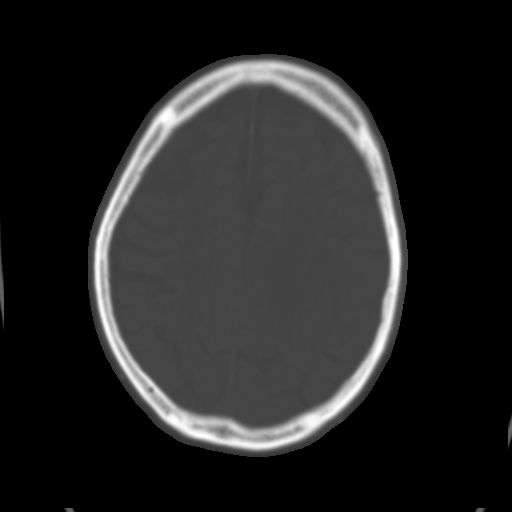
[im 25/33  brain]
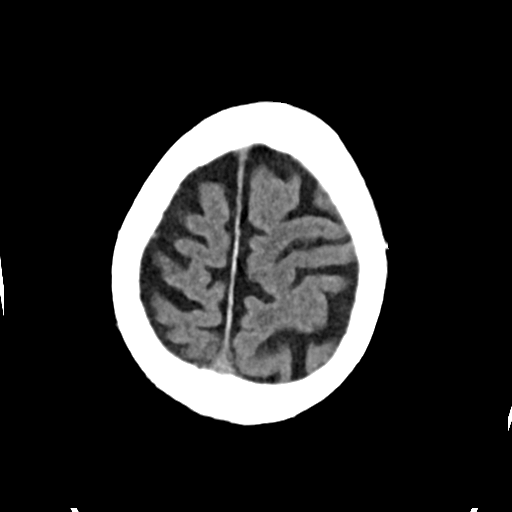
[im 29/33  brain]
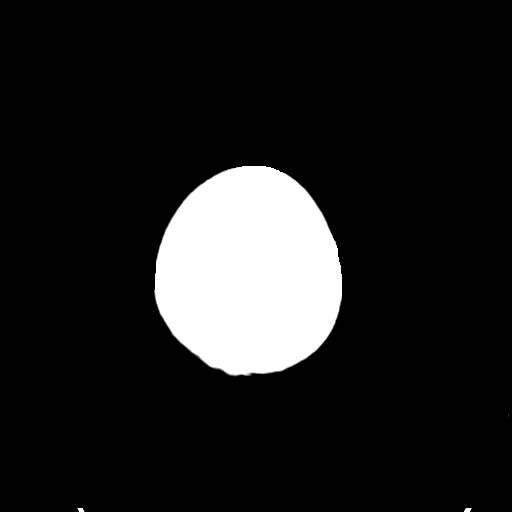

[Series 3: head bone · axial · 0.41mm/px · z∈[+1466,+1522]mm · 4 of 82 slices shown]
[im 9/82  bone]
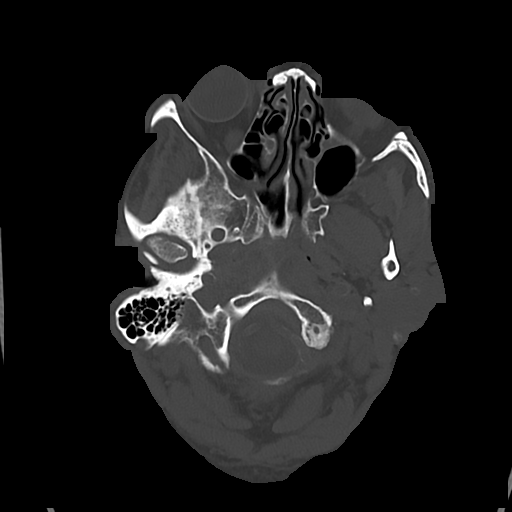
[im 17/82  bone]
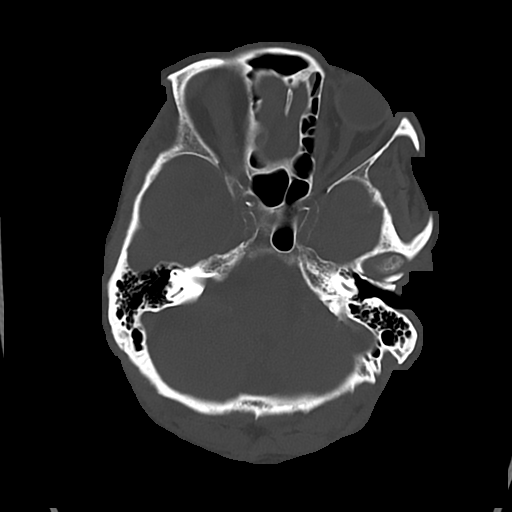
[im 25/82  bone]
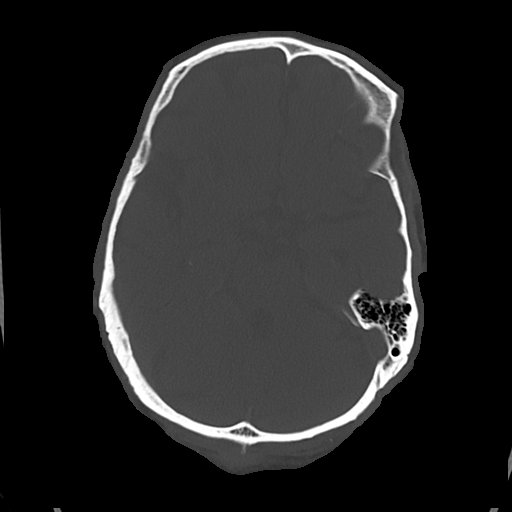
[im 37/82  bone]
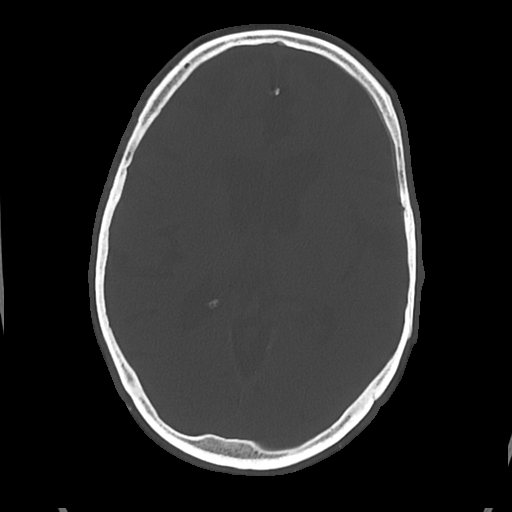

[Series 4: cor soft · coronal · 0.35mm/px · 3 of 64 slices shown]
[im 22/64  brain]
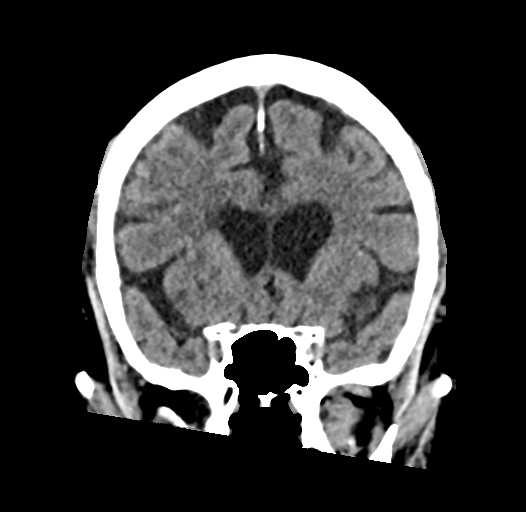
[im 29/64  brain]
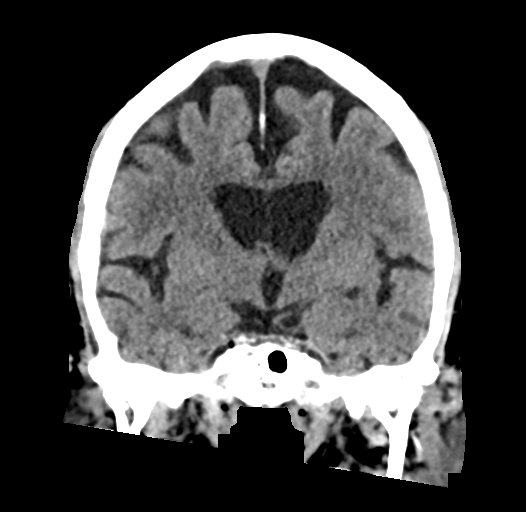
[im 36/64  brain]
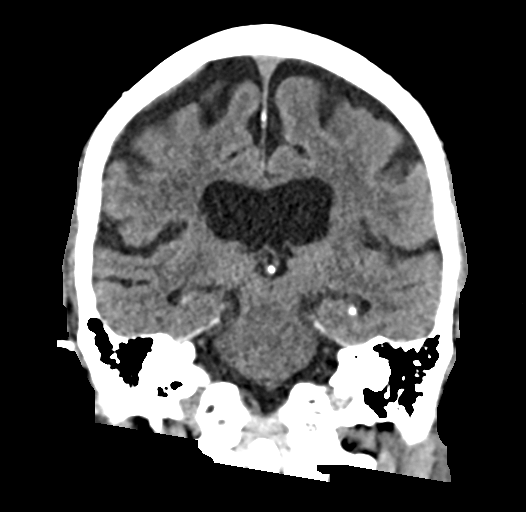

[Series 5: sag soft · sagittal · 0.35mm/px · 3 of 62 slices shown]
[im 25/62  brain]
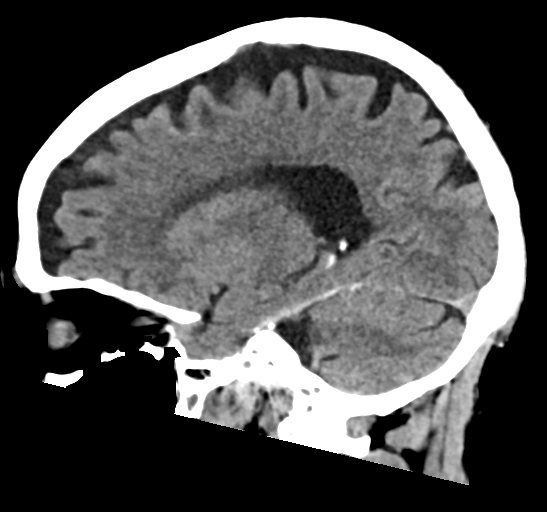
[im 31/62  brain]
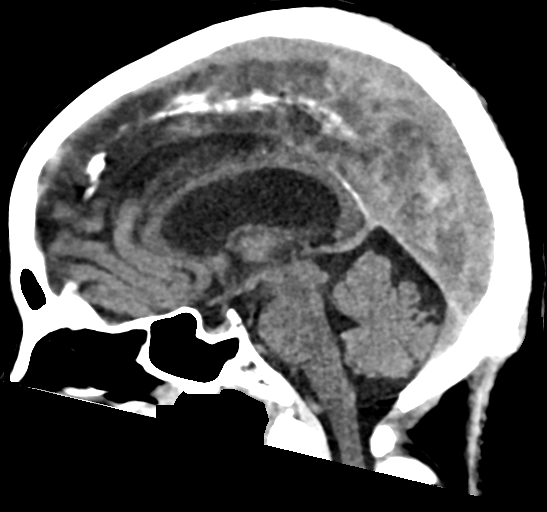
[im 38/62  brain]
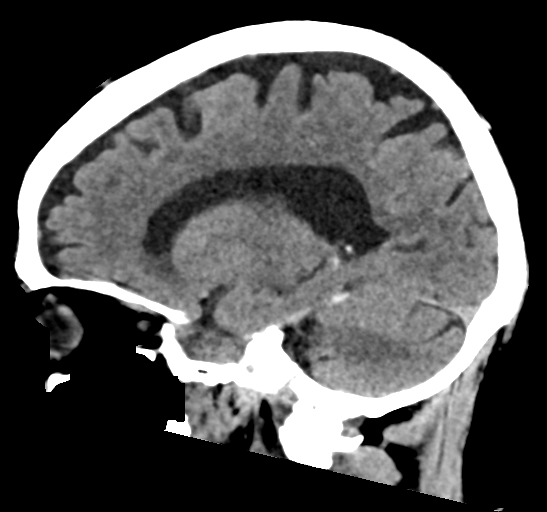

[17 of 47 positions shown; findings below may reference images not displayed]

FINDINGS: Brain: There is no mass, hemorrhage or extra-axial collection. There
is generalized atrophy without lobar predilection. Hypodensity of
the white matter is most commonly associated with chronic
microvascular disease.

Vascular: Atherosclerotic calcification of the internal carotid
arteries at the skull base. No abnormal hyperdensity of the major
intracranial arteries or dural venous sinuses.

Skull: The visualized skull base, calvarium and extracranial soft
tissues are normal.

Sinuses/Orbits: No fluid levels or advanced mucosal thickening of
the visualized paranasal sinuses. No mastoid or middle ear effusion.
The orbits are normal.
IMPRESSION: Chronic microvascular ischemia and generalized atrophy without acute
intracranial abnormality.

## 2021-08-11 ENCOUNTER — Emergency Department (HOSPITAL_COMMUNITY): Payer: No Typology Code available for payment source

## 2021-08-11 ENCOUNTER — Inpatient Hospital Stay (HOSPITAL_COMMUNITY)
Admission: EM | Admit: 2021-08-11 | Discharge: 2021-08-16 | DRG: 177 | Disposition: A | Discharge status: Skilled Nursing Facility | Payer: No Typology Code available for payment source | Source: Skilled Nursing Facility | Attending: Internal Medicine | Admitting: Internal Medicine

## 2021-08-11 DIAGNOSIS — I120 Hypertensive chronic kidney disease with stage 5 chronic kidney disease or end stage renal disease: Secondary | ICD-10-CM | POA: Diagnosis present

## 2021-08-11 DIAGNOSIS — U071 COVID-19: Secondary | ICD-10-CM | POA: Diagnosis not present

## 2021-08-11 DIAGNOSIS — Z7902 Long term (current) use of antithrombotics/antiplatelets: Secondary | ICD-10-CM

## 2021-08-11 DIAGNOSIS — E1151 Type 2 diabetes mellitus with diabetic peripheral angiopathy without gangrene: Secondary | ICD-10-CM | POA: Diagnosis present

## 2021-08-11 DIAGNOSIS — Z85118 Personal history of other malignant neoplasm of bronchus and lung: Secondary | ICD-10-CM

## 2021-08-11 DIAGNOSIS — N186 End stage renal disease: Secondary | ICD-10-CM | POA: Diagnosis not present

## 2021-08-11 DIAGNOSIS — E1122 Type 2 diabetes mellitus with diabetic chronic kidney disease: Secondary | ICD-10-CM | POA: Diagnosis present

## 2021-08-11 DIAGNOSIS — I739 Peripheral vascular disease, unspecified: Secondary | ICD-10-CM | POA: Diagnosis present

## 2021-08-11 DIAGNOSIS — Z89512 Acquired absence of left leg below knee: Secondary | ICD-10-CM

## 2021-08-11 DIAGNOSIS — E876 Hypokalemia: Secondary | ICD-10-CM | POA: Diagnosis present

## 2021-08-11 DIAGNOSIS — G9341 Metabolic encephalopathy: Secondary | ICD-10-CM

## 2021-08-11 DIAGNOSIS — R197 Diarrhea, unspecified: Secondary | ICD-10-CM | POA: Diagnosis present

## 2021-08-11 DIAGNOSIS — Z992 Dependence on renal dialysis: Secondary | ICD-10-CM

## 2021-08-11 DIAGNOSIS — R509 Fever, unspecified: Secondary | ICD-10-CM

## 2021-08-11 DIAGNOSIS — Z79899 Other long term (current) drug therapy: Secondary | ICD-10-CM

## 2021-08-11 DIAGNOSIS — Z87891 Personal history of nicotine dependence: Secondary | ICD-10-CM

## 2021-08-11 DIAGNOSIS — N2581 Secondary hyperparathyroidism of renal origin: Secondary | ICD-10-CM | POA: Diagnosis present

## 2021-08-11 DIAGNOSIS — Z888 Allergy status to other drugs, medicaments and biological substances status: Secondary | ICD-10-CM

## 2021-08-11 DIAGNOSIS — R0602 Shortness of breath: Secondary | ICD-10-CM

## 2021-08-11 DIAGNOSIS — D631 Anemia in chronic kidney disease: Secondary | ICD-10-CM | POA: Diagnosis present

## 2021-08-11 DIAGNOSIS — R54 Age-related physical debility: Secondary | ICD-10-CM | POA: Diagnosis present

## 2021-08-11 DIAGNOSIS — D61818 Other pancytopenia: Secondary | ICD-10-CM | POA: Diagnosis not present

## 2021-08-11 DIAGNOSIS — I10 Essential (primary) hypertension: Secondary | ICD-10-CM | POA: Diagnosis present

## 2021-08-11 LAB — CBC WITH DIFFERENTIAL/PLATELET
Abs Immature Granulocytes: 0.03 10*3/uL (ref 0.00–0.07)
Basophils Absolute: 0.1 10*3/uL (ref 0.0–0.1)
Basophils Relative: 1 %
Eosinophils Absolute: 0.1 10*3/uL (ref 0.0–0.5)
Eosinophils Relative: 2 %
HCT: 43.1 % (ref 39.0–52.0)
Hemoglobin: 14.4 g/dL (ref 13.0–17.0)
Immature Granulocytes: 1 %
Lymphocytes Relative: 14 %
Lymphs Abs: 0.7 10*3/uL (ref 0.7–4.0)
MCH: 32.9 pg (ref 26.0–34.0)
MCHC: 33.4 g/dL (ref 30.0–36.0)
MCV: 98.4 fL (ref 80.0–100.0)
Monocytes Absolute: 0.5 10*3/uL (ref 0.1–1.0)
Monocytes Relative: 10 %
Neutro Abs: 3.8 10*3/uL (ref 1.7–7.7)
Neutrophils Relative %: 72 %
Platelets: 141 10*3/uL — ABNORMAL LOW (ref 150–400)
RBC: 4.38 MIL/uL (ref 4.22–5.81)
RDW: 16.5 % — ABNORMAL HIGH (ref 11.5–15.5)
WBC: 5.2 10*3/uL (ref 4.0–10.5)
nRBC: 0 % (ref 0.0–0.2)

## 2021-08-11 LAB — COMPREHENSIVE METABOLIC PANEL
ALT: 19 U/L (ref 0–44)
AST: 26 U/L (ref 15–41)
Albumin: 3.6 g/dL (ref 3.5–5.0)
Alkaline Phosphatase: 100 U/L (ref 38–126)
Anion gap: 15 (ref 5–15)
BUN: 14 mg/dL (ref 8–23)
CO2: 29 mmol/L (ref 22–32)
Calcium: 8.7 mg/dL — ABNORMAL LOW (ref 8.9–10.3)
Chloride: 94 mmol/L — ABNORMAL LOW (ref 98–111)
Creatinine, Ser: 3.76 mg/dL — ABNORMAL HIGH (ref 0.61–1.24)
GFR, Estimated: 16 mL/min — ABNORMAL LOW (ref >60)
Glucose, Bld: 140 mg/dL — ABNORMAL HIGH (ref 70–99)
Potassium: 3.3 mmol/L — ABNORMAL LOW (ref 3.5–5.1)
Sodium: 138 mmol/L (ref 135–145)
Total Bilirubin: 0.8 mg/dL (ref 0.3–1.2)
Total Protein: 8.5 g/dL — ABNORMAL HIGH (ref 6.5–8.1)

## 2021-08-11 LAB — LACTIC ACID, PLASMA: Lactic Acid, Venous: 1.5 mmol/L (ref 0.5–1.9)

## 2021-08-11 NOTE — ED Provider Notes (Signed)
Emergency Medicine Provider Triage Evaluation Note  Roger Vazquez , a 70 y.o. male  was evaluated in triage.  Pt complains of fevers and disoriented.  He had recent BKA.  He doesn't know why he is here.  He was sent from dialysis for fever.   Review of Systems  Positive: Fever, disorientation Negative: Syncope  Physical Exam  There were no vitals taken for this visit. Gen:   Awake, no distress   Resp:  Normal effort  MSK:   Moves remaining extremities with out difficulty.  Other:  Oriented to person, not to place or time.   Medical Decision Making  Medically screening exam initiated at 5:43 PM.  Appropriate orders placed.  Roger Vazquez was informed that the remainder of the evaluation will be completed by another provider, this initial triage assessment does not replace that evaluation, and the importance of remaining in the ED until their evaluation is complete.  Note: Portions of this report may have been transcribed using voice recognition software. Every effort was made to ensure accuracy; however, inadvertent computerized transcription errors may be present.    Lorin Glass, PA-C 08/11/21 1807    Lucrezia Starch, MD 08/12/21 [phone]

## 2021-08-11 NOTE — ED Provider Notes (Addendum)
[facility] EMERGENCY DEPARTMENT Provider Note   CSN: [id number] Arrival date & time: 08/11/21  1728     History No chief complaint on file.   Roger Vazquez is a 70 y.o. male. Level 5 caveat due to confusion. HPI Patient sent in from dialysis for fever and confusion.  Reportedly having cough.  Fever of 102.1 upon arrival.  Did have previous left below the knee amputation about 2 months ago.  Wound reportedly healing well.  No neck pain.  No nausea or vomiting.  Reportedly has some mild baseline confusion but is more confused now.    Past Medical History:  Diagnosis Date   Anemia    Blood dyscrasia    history of coagulation deficit per [facility] nursing home   Cancer [facility]    Lung   Diabetes mellitus    Hepatitis    Hypertension    Renal disorder    MWF- [facility]    Patient Active Problem List   Diagnosis Date Noted   Peripheral vascular disease ([place]) 06/09/2021   S/P BKA (below knee amputation), left ([place]) 06/08/2021   Status post amputation of foot through metatarsal bone ([place])    Altered mental status 05/19/2021   Left foot infection    Alcohol abuse 05/16/2021   Erectile dysfunction 05/16/2021   Hydronephrosis 05/16/2021   Acute hepatitis C 05/16/2021   Diabetic retinopathy ([place]) 05/16/2021   History of hemodialysis 05/16/2021   Iron deficiency anemia 05/16/2021   Malignant neoplasm of upper lobe, left bronchus or lung ([place]) 05/16/2021   Noncompliance with medication regimen 05/16/2021   Non-small cell lung cancer ([place]) 05/16/2021   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   Vitreous hemorrhage, left eye ([place]) 05/16/2021   Cellulitis of left lower extremity    Gangrene ([place]) 04/27/2021   Diarrhea, unspecified 04/29/2019   Mild protein-calorie malnutrition ([place]) 08/20/2018   Fever    Infection of AV graft for dialysis ([place]) 08/05/2018   Symptomatic anemia 08/05/2018   Hypokalemia 08/05/2018   GERD (gastroesophageal  reflux disease) 08/05/2018   Sepsis due to unspecified Staphylococcus ([place]) 08/02/2018   ESRD on hemodialysis ([place])    Bacteremia due to methicillin susceptible Staphylococcus aureus (MSSA) 07/28/2018   Sepsis ([place]) 07/27/2018   Headache 01/14/2017   Fluid overload, unspecified 12/13/2015   Anemia in chronic kidney disease 12/09/2015   Chronic kidney disease, stage 5 ([place]) 12/09/2015   Coagulation defect, unspecified ([place]) 12/09/2015   Encounter for adjustment and management of vascular access device 12/09/2015   Other secondary pulmonary hypertension ([place]) 12/09/2015   Pruritus, unspecified 12/09/2015   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   Shortness of breath 12/09/2015   Abdominal pain, other specified site 10/07/2011   Nausea and vomiting 10/07/2011   VITAMIN D DEFICIENCY 03/19/2008   FRACTURE, TIBIA 02/09/2008   HTN (hypertension) 11/25/2007   HEPATITIS C 05/27/2007   Type 2 diabetes mellitus with chronic kidney disease on chronic dialysis ([place]) 05/27/2007   ERECTILE DYSFUNCTION 05/27/2007   ABUSE, ALCOHOL, IN REMISSION 05/27/2007   ABUSE, OPIOID, IN REMISSION 05/27/2007   ABUSE, COCAINE, IN REMISSION 05/27/2007    Past Surgical History:  Procedure Laterality Date   ABDOMINAL AORTOGRAM W/LOWER EXTREMITY Left 04/28/2021   Procedure: ABDOMINAL AORTOGRAM W/LOWER EXTREMITY;  Surgeon: Cherre Robins, MD;  Location: [facility] CV [facility];  Service: Cardiovascular;  Laterality: Left;   AMPUTATION Left 05/04/2021   Procedure: LEFT SECOND TOE AMPUTATION;  Surgeon: Cherre Robins, MD;  Location:  [facility] OR;  Service: Vascular;  Laterality: Left;   AMPUTATION Left 06/08/2021   Procedure: LEFT BELOW KNEE AMPUTATION;  Surgeon: Cherre Robins, MD;  Location: [facility];  Service: Vascular;  Laterality: Left;   APPLICATION OF WOUND VAC Left 06/08/2021   Procedure: APPLICATION OF WOUND VAC;  Surgeon: Cherre Robins, MD;  Location: [facility];  Service: Vascular;  Laterality:  Left;   artiovenous graft placement Left    AV FISTULA PLACEMENT Right 11/27/2018   Procedure: INSERTION 4-7MM X 45CM GORETEX GRAFT RIGHT FOREARM ;  Surgeon: Rosetta Posner, MD;  Location: [facility];  Service: Vascular;  Laterality: Right;   [place] REMOVAL Left 08/06/2018   Procedure: REMOVAL OF LEFT ARM GRAFT;  Surgeon: Rosetta Posner, MD;  Location: [facility];  Service: Vascular;  Laterality: Left;   INSERTION OF DIALYSIS CATHETER Right 08/08/2018   Procedure: INSERTION OF TUNNELED  DIALYSIS CATHETER;  Surgeon: Marty Heck, MD;  Location: [facility];  Service: Vascular;  Laterality: Right;   IR DIALY SHUNT INTRO [place] W/IMG RIGHT Right 05/26/2021   IR REMOVAL TUN CV CATH W/O FL  02/19/2019   IR US GUIDE VASC ACCESS RIGHT  05/26/2021   LUNG SURGERY     patient not if they removed part of lung. Was done at [facility] Left 04/28/2021   Procedure: PERIPHERAL VASCULAR BALLOON ANGIOPLASTY;  Surgeon: Cherre Robins, MD;  Location: [facility];  Service: Cardiovascular;  Laterality: Left;  popliteal and posterial tibial   TEE WITHOUT CARDIOVERSION N/A 07/31/2018   Procedure: TRANSESOPHAGEAL ECHOCARDIOGRAM (TEE);  Surgeon: Pixie Casino, MD;  Location: [facility] ENDOSCOPY;  Service: Cardiovascular;  Laterality: N/A;   TRANSMETATARSAL AMPUTATION Left 05/22/2021   Procedure: LEFT TRANSMETATARSAL AMPUTATION;  Surgeon: Elam Dutch, MD;  Location: [facility];  Service: Vascular;  Laterality: Left;       No family history on file.  Social History   Tobacco Use   Smoking status: Former    Packs/day: 0.50    Years: 10.00    Pack years: 5.00    Types: Cigarettes    Quit date: 2001    Years since quitting: 21.8   Smokeless tobacco: Former    Quit date: 08/01/2015  Vaping Use   Vaping Use: Never used  Substance Use Topics   Alcohol use: Yes    Alcohol/week: 2.0 standard drinks    Types: 2 Cans of beer per week   Drug use: Yes    Types:  Marijuana    Comment: last time 11/19/2018    Home Medications Prior to Admission medications   Medication Sig Start Date End Date Taking? Authorizing Provider  acetaminophen (TYLENOL) 500 MG tablet Take 1 tablet (500 mg total) by mouth every 6 (six) hours. 05/05/21   Ezequiel [place], MD  aspirin 81 MG chewable tablet Chew 81 mg by mouth daily. (0800)    [provider]  calcium acetate (PHOSLO) 667 MG capsule Take 667 mg by mouth 3 (three) times daily with meals. (0800, 1300 & 1700)    [provider]  clopidogrel (PLAVIX) 75 MG tablet Take 1 tablet (75 mg total) by mouth daily. 05/05/21   Ezequiel [place], MD  Doxercalciferol (HECTOROL IV) Doxercalciferol (Hectorol) 06/02/21 06/01/22  [provider]  heparin 1000 unit/mL SOLN injection Heparin Sodium (Porcine) 1,000 Units/mL Systemic 06/02/21 06/01/22  [provider]  lidocaine-prilocaine (EMLA) cream Apply 1 application topically as needed. 04/28/21   [provider]  Multiple Vitamin (MULITIVITAMIN WITH MINERALS) TABS Take 1 tablet by mouth daily. 10/12/11   Robbie Lis, MD  polyethylene glycol (MIRALAX / GLYCOLAX) 17 g packet Take 17 g by mouth 2 (two) times daily. 05/05/21   Ezequiel [place], MD  senna (SENOKOT) 8.6 MG TABS tablet Take 1 tablet (8.6 mg total) by mouth daily. 05/05/21   Ezequiel [place], MD    Allergies    Sildenafil  Review of Systems   Review of Systems  Unable to perform ROS: Mental status change   Physical Exam Updated Vital Signs BP (!) 156/81 (BP Location: Left Arm)   Pulse 92   Temp 98.7 F (37.1 C) (Oral)   Resp 17   Ht 5\' 6"  (1.676 m)   Wt 62.6 kg   SpO2 100%   BMI 22.27 kg/m   Physical Exam Vitals and nursing note reviewed.  Constitutional:      Appearance: Normal appearance.  HENT:     Head: Normocephalic.  Eyes:     Pupils: Pupils are equal, round, and reactive to light.  Cardiovascular:     Rate and Rhythm: Regular rhythm.  Pulmonary:     Breath  sounds: No wheezing or rhonchi.     Comments: Occasional cough Abdominal:     Tenderness: There is no abdominal tenderness.  Musculoskeletal:     Cervical back: Neck supple.     Comments: Healing wound left lower leg.  No erythema.  No induration.  Did have some area on the anterior aspect where the Steri-Strips to come off but no drainage.  Skin:    General: Skin is warm.  Neurological:     Comments: Awake and pleasant, but some confusion.    ED Results / Procedures / Treatments   Labs (all labs ordered are listed, but only abnormal results are displayed) Labs Reviewed  RESP PANEL BY RT-PCR (FLU A&B, COVID) ARPGX2 - Abnormal; Notable for the following components:      Result Value   SARS Coronavirus 2 by RT PCR POSITIVE (*)    All other components within normal limits  CBC WITH DIFFERENTIAL/PLATELET - Abnormal; Notable for the following components:   RDW 16.5 (*)    Platelets 141 (*)    All other components within normal limits  COMPREHENSIVE METABOLIC PANEL - Abnormal; Notable for the following components:   Potassium 3.3 (*)    Chloride 94 (*)    Glucose, Bld 140 (*)    Creatinine, Ser 3.76 (*)    Calcium 8.7 (*)    Total Protein 8.5 (*)    GFR, Estimated 16 (*)    All other components within normal limits  CULTURE, BLOOD (ROUTINE X 2)  CULTURE, BLOOD (ROUTINE X 2)  LACTIC ACID, PLASMA  LACTIC ACID, PLASMA  D-DIMER, QUANTITATIVE  PROCALCITONIN  LACTATE DEHYDROGENASE  FERRITIN  TRIGLYCERIDES  FIBRINOGEN  C-REACTIVE PROTEIN    EKG EKG Interpretation  Date/Time:  Friday August 11 2021 17:51:50 EDT Ventricular Rate:  96 PR Interval:  262 QRS Duration: 70 QT Interval:  422 QTC Calculation: 533 R Axis:   60 Text Interpretation: Sinus rhythm with 1st degree A-V block with occasional Premature ventricular complexes Minimal voltage criteria for LVH, may be normal variant ( Sokolow-Lyon ) ST & T wave abnormality, consider inferolateral ischemia Prolonged QT Abnormal  ECG no acute change when compared to prior ecg on 8/26 Confirmed by Madalyn Rob [phone]) on 08/11/2021 6:14:59 PM  Radiology DG Chest 2 View  Result Date: 08/11/2021 CLINICAL DATA:  Fever.  Disoriented. EXAM: CHEST - 2 VIEW COMPARISON:  One-view chest x-ray 05/27/2021 FINDINGS: Heart is mildly enlarged. Atherosclerotic calcifications present at the aortic arch. Left hemidiaphragm is chronically elevated. The lungs are clear. Axial skeleton is unremarkable. IMPRESSION: No acute cardiopulmonary disease. Electronically Signed   By: San Morelle M.D.   On: 08/11/2021 19:05   CT HEAD WO CONTRAST (5MM)  Result Date: 08/11/2021 CLINICAL DATA:  Altered level of consciousness EXAM: CT HEAD WITHOUT CONTRAST TECHNIQUE: Contiguous axial images were obtained from the base of the skull through the vertex without intravenous contrast. COMPARISON:  05/19/2021 FINDINGS: Brain: No acute infarct or hemorrhage. Stable chronic small vessel ischemic changes within the periventricular white matter and basal ganglia. Lateral ventricles and midline structures are unremarkable. No acute extra-axial fluid collections. No mass effect. Vascular: Stable atherosclerosis.  No hyperdense vessel. Skull: Normal. Negative for fracture or focal lesion. Sinuses/Orbits: No acute finding. Other: None. IMPRESSION: 1. Stable head CT, no acute intracranial process. Electronically Signed   By: Randa Ngo M.D.   On: 08/11/2021 19:16    Procedures Procedures   Medications Ordered in ED Medications - No data to display  ED Course  I have reviewed the triage vital signs and the nursing notes.  Pertinent labs & imaging results that were available during my care of the patient were reviewed by me and considered in my medical decision making (see chart for details).    MDM Rules/Calculators/A&P                           Patient with fever and cough.  Mental status change.  Confusion. reportedly came from dialysis.  Unsure  how much dialysis was done today but labs and CT reassuring.  With fever and worsening mental status I feels the patient benefit from mission to the hospital.  I think pulmonary is likely source.  COVID test still pending.  Will discuss with unassigned medicine for admission.  COVID test is now returned positive.  Patient is not hypoxic but with mental status changes I think he would still benefit from admission to the hospital. Final Clinical Impression(s) / ED Diagnoses Final diagnoses:  Fever, unspecified fever cause  End stage renal disease on dialysis [facility])  COVID-19    Rx / DC Orders ED Discharge Orders     None        Davonna Belling, MD 08/11/21 8366    Davonna Belling, MD 08/12/21 0009

## 2021-08-11 NOTE — ED Triage Notes (Signed)
Patient BIB [facility] from dialysis for evaluation of fever, recent right BKA d/t gangrene. Lives at [facility], is alert and oriented to person only, baseline is oriented to self and place. Dialysis graft in right arm.  CBG 155 HR 95-100 afib BP 146/92 SpO2 94% on room air

## 2021-08-12 ENCOUNTER — Other Ambulatory Visit: Payer: Self-pay

## 2021-08-12 ENCOUNTER — Encounter (HOSPITAL_COMMUNITY): Payer: Self-pay | Admitting: Internal Medicine

## 2021-08-12 ENCOUNTER — Observation Stay (HOSPITAL_COMMUNITY): Payer: No Typology Code available for payment source

## 2021-08-12 DIAGNOSIS — D61818 Other pancytopenia: Secondary | ICD-10-CM | POA: Diagnosis not present

## 2021-08-12 DIAGNOSIS — Z89512 Acquired absence of left leg below knee: Secondary | ICD-10-CM | POA: Diagnosis not present

## 2021-08-12 DIAGNOSIS — Z85118 Personal history of other malignant neoplasm of bronchus and lung: Secondary | ICD-10-CM | POA: Diagnosis not present

## 2021-08-12 DIAGNOSIS — N186 End stage renal disease: Secondary | ICD-10-CM

## 2021-08-12 DIAGNOSIS — N2581 Secondary hyperparathyroidism of renal origin: Secondary | ICD-10-CM | POA: Diagnosis present

## 2021-08-12 DIAGNOSIS — U071 COVID-19: Secondary | ICD-10-CM

## 2021-08-12 DIAGNOSIS — R509 Fever, unspecified: Secondary | ICD-10-CM | POA: Diagnosis not present

## 2021-08-12 DIAGNOSIS — G9341 Metabolic encephalopathy: Secondary | ICD-10-CM | POA: Diagnosis present

## 2021-08-12 DIAGNOSIS — E1151 Type 2 diabetes mellitus with diabetic peripheral angiopathy without gangrene: Secondary | ICD-10-CM | POA: Diagnosis present

## 2021-08-12 DIAGNOSIS — I120 Hypertensive chronic kidney disease with stage 5 chronic kidney disease or end stage renal disease: Secondary | ICD-10-CM | POA: Diagnosis present

## 2021-08-12 DIAGNOSIS — D631 Anemia in chronic kidney disease: Secondary | ICD-10-CM | POA: Diagnosis present

## 2021-08-12 DIAGNOSIS — Z992 Dependence on renal dialysis: Secondary | ICD-10-CM | POA: Diagnosis not present

## 2021-08-12 DIAGNOSIS — I10 Essential (primary) hypertension: Secondary | ICD-10-CM

## 2021-08-12 DIAGNOSIS — R7989 Other specified abnormal findings of blood chemistry: Secondary | ICD-10-CM | POA: Diagnosis not present

## 2021-08-12 DIAGNOSIS — R54 Age-related physical debility: Secondary | ICD-10-CM | POA: Diagnosis present

## 2021-08-12 DIAGNOSIS — E1122 Type 2 diabetes mellitus with diabetic chronic kidney disease: Secondary | ICD-10-CM | POA: Diagnosis present

## 2021-08-12 DIAGNOSIS — Z87891 Personal history of nicotine dependence: Secondary | ICD-10-CM | POA: Diagnosis not present

## 2021-08-12 DIAGNOSIS — Z79899 Other long term (current) drug therapy: Secondary | ICD-10-CM | POA: Diagnosis not present

## 2021-08-12 DIAGNOSIS — R197 Diarrhea, unspecified: Secondary | ICD-10-CM | POA: Diagnosis present

## 2021-08-12 DIAGNOSIS — Z7902 Long term (current) use of antithrombotics/antiplatelets: Secondary | ICD-10-CM | POA: Diagnosis not present

## 2021-08-12 DIAGNOSIS — E876 Hypokalemia: Secondary | ICD-10-CM | POA: Diagnosis present

## 2021-08-12 DIAGNOSIS — Z888 Allergy status to other drugs, medicaments and biological substances status: Secondary | ICD-10-CM | POA: Diagnosis not present

## 2021-08-12 LAB — TROPONIN I (HIGH SENSITIVITY)
Troponin I (High Sensitivity): 37 ng/L — ABNORMAL HIGH (ref <18)
Troponin I (High Sensitivity): 40 ng/L — ABNORMAL HIGH (ref <18)
Troponin I (High Sensitivity): 45 ng/L — ABNORMAL HIGH (ref <18)

## 2021-08-12 LAB — RENAL FUNCTION PANEL
Albumin: 2.5 g/dL — ABNORMAL LOW (ref 3.5–5.0)
Anion gap: 11 (ref 5–15)
BUN: 14 mg/dL (ref 8–23)
CO2: 26 mmol/L (ref 22–32)
Calcium: 7 mg/dL — ABNORMAL LOW (ref 8.9–10.3)
Chloride: 101 mmol/L (ref 98–111)
Creatinine, Ser: 3.98 mg/dL — ABNORMAL HIGH (ref 0.61–1.24)
GFR, Estimated: 15 mL/min — ABNORMAL LOW (ref >60)
Glucose, Bld: 87 mg/dL (ref 70–99)
Phosphorus: 3.1 mg/dL (ref 2.5–4.6)
Potassium: 2.9 mmol/L — ABNORMAL LOW (ref 3.5–5.1)
Sodium: 138 mmol/L (ref 135–145)

## 2021-08-12 LAB — CBC
HCT: 37.5 % — ABNORMAL LOW (ref 39.0–52.0)
Hemoglobin: 11.7 g/dL — ABNORMAL LOW (ref 13.0–17.0)
MCH: 31.5 pg (ref 26.0–34.0)
MCHC: 31.2 g/dL (ref 30.0–36.0)
MCV: 101.1 fL — ABNORMAL HIGH (ref 80.0–100.0)
Platelets: 119 10*3/uL — ABNORMAL LOW (ref 150–400)
RBC: 3.71 MIL/uL — ABNORMAL LOW (ref 4.22–5.81)
RDW: 16.6 % — ABNORMAL HIGH (ref 11.5–15.5)
WBC: 3.7 10*3/uL — ABNORMAL LOW (ref 4.0–10.5)
nRBC: 0 % (ref 0.0–0.2)

## 2021-08-12 LAB — TRIGLYCERIDES: Triglycerides: 80 mg/dL (ref <150)

## 2021-08-12 LAB — AMMONIA: Ammonia: 24 umol/L (ref 9–35)

## 2021-08-12 LAB — VITAMIN B12: Vitamin B-12: 585 pg/mL (ref 180–914)

## 2021-08-12 LAB — RESP PANEL BY RT-PCR (FLU A&B, COVID) ARPGX2
Influenza A by PCR: NEGATIVE
Influenza B by PCR: NEGATIVE
SARS Coronavirus 2 by RT PCR: POSITIVE — AB

## 2021-08-12 LAB — GLUCOSE, CAPILLARY
Glucose-Capillary: 164 mg/dL — ABNORMAL HIGH (ref 70–99)
Glucose-Capillary: 103 mg/dL — ABNORMAL HIGH (ref 70–99)

## 2021-08-12 LAB — C-REACTIVE PROTEIN: CRP: 5.6 mg/dL — ABNORMAL HIGH (ref <1.0)

## 2021-08-12 LAB — LACTIC ACID, PLASMA: Lactic Acid, Venous: 1.2 mmol/L (ref 0.5–1.9)

## 2021-08-12 LAB — TSH: TSH: 2.598 u[IU]/mL (ref 0.350–4.500)

## 2021-08-12 LAB — LACTATE DEHYDROGENASE: LDH: 190 U/L (ref 98–192)

## 2021-08-12 LAB — CBG MONITORING, ED
Glucose-Capillary: 90 mg/dL (ref 70–99)
Glucose-Capillary: 137 mg/dL — ABNORMAL HIGH (ref 70–99)

## 2021-08-12 LAB — FIBRINOGEN: Fibrinogen: 384 mg/dL (ref 210–475)

## 2021-08-12 LAB — D-DIMER, QUANTITATIVE: D-Dimer, Quant: 3.2 ug/mL-FEU — ABNORMAL HIGH (ref 0.00–0.50)

## 2021-08-12 LAB — FERRITIN: Ferritin: 971 ng/mL — ABNORMAL HIGH (ref 24–336)

## 2021-08-12 LAB — MAGNESIUM: Magnesium: 2.4 mg/dL (ref 1.7–2.4)

## 2021-08-12 LAB — PROCALCITONIN: Procalcitonin: 1.18 ng/mL

## 2021-08-12 MED ORDER — POTASSIUM CHLORIDE CRYS ER 20 MEQ PO TBCR
40.0000 meq | EXTENDED_RELEASE_TABLET | Freq: Once | ORAL | Status: AC
  Administered 2021-08-12: 40 meq via ORAL
  Filled 2021-08-12: qty 2

## 2021-08-12 MED ORDER — FERROUS SULFATE 325 (65 FE) MG PO TABS
325.0000 mg | ORAL_TABLET | Freq: Every day | ORAL | Status: DC
  Administered 2021-08-12: 325 mg via ORAL
  Filled 2021-08-12: qty 1

## 2021-08-12 MED ORDER — NEPRO/CARBSTEADY PO LIQD
237.0000 mL | Freq: Two times a day (BID) | ORAL | Status: DC
  Administered 2021-08-13 – 2021-08-16 (×7): 237 mL via ORAL

## 2021-08-12 MED ORDER — APIXABAN 5 MG PO TABS: 5.0000 mg | ORAL_TABLET | Freq: Two times a day (BID) | ORAL | Status: DC

## 2021-08-12 MED ORDER — SENNA 8.6 MG PO TABS
1.0000 | ORAL_TABLET | Freq: Every day | ORAL | Status: DC
  Administered 2021-08-12 – 2021-08-16 (×5): 8.6 mg via ORAL
  Filled 2021-08-12 (×5): qty 1

## 2021-08-12 MED ORDER — APIXABAN 5 MG PO TABS
10.0000 mg | ORAL_TABLET | Freq: Two times a day (BID) | ORAL | Status: DC
  Administered 2021-08-12 – 2021-08-16 (×9): 10 mg via ORAL
  Filled 2021-08-12 (×9): qty 2

## 2021-08-12 MED ORDER — SODIUM CHLORIDE 0.9 % IV SOLN
100.0000 mg | Freq: Every day | INTRAVENOUS | Status: AC
  Administered 2021-08-13 – 2021-08-14 (×2): 100 mg via INTRAVENOUS
  Filled 2021-08-12 (×2): qty 20

## 2021-08-12 MED ORDER — AMLODIPINE BESYLATE 5 MG PO TABS
5.0000 mg | ORAL_TABLET | Freq: Every day | ORAL | Status: DC
  Administered 2021-08-12 – 2021-08-14 (×3): 5 mg via ORAL
  Filled 2021-08-12 (×3): qty 1

## 2021-08-12 MED ORDER — GUAIFENESIN-DM 100-10 MG/5ML PO SYRP: 5.0000 mL | ORAL_SOLUTION | ORAL | Status: DC | PRN

## 2021-08-12 MED ORDER — ACETAMINOPHEN 325 MG PO TABS
650.0000 mg | ORAL_TABLET | Freq: Four times a day (QID) | ORAL | Status: DC | PRN
  Administered 2021-08-12 – 2021-08-13 (×3): 650 mg via ORAL
  Filled 2021-08-12 (×4): qty 2

## 2021-08-12 MED ORDER — HEPARIN SODIUM (PORCINE) 5000 UNIT/ML IJ SOLN
5000.0000 [IU] | Freq: Three times a day (TID) | INTRAMUSCULAR | Status: DC
  Administered 2021-08-12: 5000 [IU] via SUBCUTANEOUS
  Filled 2021-08-12: qty 1

## 2021-08-12 MED ORDER — INSULIN ASPART 100 UNIT/ML IJ SOLN
0.0000 [IU] | Freq: Three times a day (TID) | INTRAMUSCULAR | Status: DC
  Administered 2021-08-12 – 2021-08-15 (×5): 1 [IU] via SUBCUTANEOUS

## 2021-08-12 MED ORDER — ASPIRIN 81 MG PO CHEW
81.0000 mg | CHEWABLE_TABLET | Freq: Every day | ORAL | Status: DC
  Administered 2021-08-12 – 2021-08-16 (×5): 81 mg via ORAL
  Filled 2021-08-12 (×5): qty 1

## 2021-08-12 MED ORDER — ACETAMINOPHEN 650 MG RE SUPP: 650.0000 mg | Freq: Four times a day (QID) | RECTAL | Status: DC | PRN

## 2021-08-12 MED ORDER — SODIUM CHLORIDE 0.9 % IV SOLN: 100.0000 mg | Freq: Every day | INTRAVENOUS | Status: DC

## 2021-08-12 MED ORDER — INSULIN ASPART 100 UNIT/ML IJ SOLN
0.0000 [IU] | Freq: Every day | INTRAMUSCULAR | Status: DC
  Administered 2021-08-13: 2 [IU] via SUBCUTANEOUS

## 2021-08-12 MED ORDER — OXYCODONE-ACETAMINOPHEN 5-325 MG PO TABS
1.0000 | ORAL_TABLET | Freq: Four times a day (QID) | ORAL | Status: DC | PRN
  Administered 2021-08-13 – 2021-08-14 (×3): 1 via ORAL
  Administered 2021-08-15: 2 via ORAL
  Filled 2021-08-12 (×2): qty 1
  Filled 2021-08-12: qty 2
  Filled 2021-08-12: qty 1

## 2021-08-12 MED ORDER — CLOPIDOGREL BISULFATE 75 MG PO TABS
75.0000 mg | ORAL_TABLET | Freq: Every day | ORAL | Status: DC
  Administered 2021-08-12: 75 mg via ORAL
  Filled 2021-08-12: qty 1

## 2021-08-12 MED ORDER — HYDRALAZINE HCL 20 MG/ML IJ SOLN
10.0000 mg | Freq: Four times a day (QID) | INTRAMUSCULAR | Status: DC | PRN
  Administered 2021-08-12 (×2): 10 mg via INTRAVENOUS
  Filled 2021-08-12 (×2): qty 1

## 2021-08-12 MED ORDER — SODIUM CHLORIDE 0.9 % IV SOLN
200.0000 mg | Freq: Once | INTRAVENOUS | Status: AC
  Administered 2021-08-12: 200 mg via INTRAVENOUS
  Filled 2021-08-12: qty 40

## 2021-08-12 MED ORDER — POLYETHYLENE GLYCOL 3350 17 G PO PACK
17.0000 g | PACK | Freq: Two times a day (BID) | ORAL | Status: DC
  Administered 2021-08-13 – 2021-08-15 (×3): 17 g via ORAL
  Filled 2021-08-12 (×7): qty 1

## 2021-08-12 NOTE — Progress Notes (Signed)
ANTICOAGULATION CONSULT NOTE - Initial Consult  Pharmacy Consult for Apixaban Indication: DVT  Allergies  Allergen Reactions   Sildenafil Other (See Comments)    Pt stated, "Makes my Blood Pressure drop"    Patient Measurements: Height: 5\' 6"  (167.6 cm) Weight: 62.6 kg (138 lb) IBW/kg (Calculated) : 63.8  Vital Signs: Temp: 98.1 F (36.7 C) (10/29 0822) Temp Source: Oral (10/29 0822) BP: 171/92 (10/29 1300) Pulse Rate: 94 (10/29 1300)  Labs: Recent Labs    08/11/21 1802 08/12/21 0111 08/12/21 0303 08/12/21 0645 08/12/21 0853  HGB 14.4  --   --   --  11.7*  HCT 43.1  --   --   --  37.5*  PLT 141*  --   --   --  119*  CREATININE 3.76*  --   --   --  3.98*  TROPONINIHS  --  37* 40* 45*  --     Estimated Creatinine Clearance: 15.1 mL/min (A) (by C-G formula based on SCr of 3.98 mg/dL (H)).   Medical History: Past Medical History:  Diagnosis Date   Anemia    Blood dyscrasia    history of coagulation deficit per [facility] nursing home   Cancer [facility])    Lung   Diabetes mellitus    Hepatitis    Hypertension    Renal disorder    MWF- [facility]    Medications:  (Not in a hospital admission)  Assessment: 70 years of age male with new non-occlusive DVT and COVID + to start Apixaban therapy. Patient with ESRD on MWF HD. CBC - pancytopenic, plts 119 thought to be COVID related. No bleeding noted.  Goal of Therapy:  Monitor platelets by anticoagulation protocol: Yes   Plan:  Apixaban 10 mg PO BID x 7 days then 5 mg PO BID Monitor CBC  Sloan Leiter, PharmD, BCPS, BCCCP Clinical Pharmacist Please refer to [facility] for [place] numbers 08/12/2021,2:19 PM

## 2021-08-12 NOTE — Consult Note (Addendum)
[facility] Consultation Note  Indication for Consultation:  Management of ESRD/hemodialysis; anemia, hypertension/volume and secondary hyperparathyroidism  [facility] V[facility]quez: Roger Vazquez is a 70 y.o. male ESRD on HD MWF at [facility].  Past medical history significant for DMT2, HTN, Hx lung cancer, Hep C and polysubstance abuse.  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 Sent from his outpatient hemodialysis [facility] Friday 10/28 secondary to fever, confusion and reported having cough.  Noted COVID-19 positive, not hypoxic not tachycardic or hypotensive temperature 102.1, initial lactic acid normal, chest x-ray negative for acute cardiopulmonary disease, CT of head no acute intracranial process, stable chronic  Sm Vess ischemic changes K2.9 this a.m., BUN 14, CR 3.98, Hgb 11.7, WBC 3.7 Currently in ER eating lunch reporting feeling weak.  Reported history of cough, shortness of breath watery diarrhea no chest pain or abdominal pain.      Past Medical History:  Diagnosis Date   Anemia    Blood dyscrasia    history of coagulation deficit per [facility]   Cancer [place] [facility])    Lung   Diabetes mellitus    Hepatitis    Hypertension    Renal disorder    MWF- [facility]    Past Surgical History:  Procedure Laterality Date   ABDOMINAL AORTOGRAM W/LOWER EXTREMITY Left 04/28/2021   Procedure: ABDOMINAL AORTOGRAM W/LOWER EXTREMITY;  Surgeon: Roger Robins, MD;  Location: [facility];  Service: Cardiovascular;  Laterality: Left;   AMPUTATION Left 05/04/2021   Procedure: LEFT SECOND TOE AMPUTATION;  Surgeon: Roger Robins, MD;  Location: [facility];  Service: Vascular;  Laterality: Left;   AMPUTATION Left 06/08/2021   Procedure: LEFT BELOW KNEE AMPUTATION;  Surgeon: Roger Robins, MD;  Location: [facility];  Service: Vascular;  Laterality: Left;   APPLICATION OF WOUND VAC Left 06/08/2021   Procedure: APPLICATION OF WOUND VAC;  Surgeon: Roger Robins,  MD;  Location: [facility];  Service: Vascular;  Laterality: Left;   artiovenous graft placement Left    AV FISTULA PLACEMENT Right 11/27/2018   Procedure: INSERTION 4-7MM X 45CM GORETEX GRAFT RIGHT FOREARM ;  Surgeon: Roger Posner, MD;  Location: [facility];  Service: Vascular;  Laterality: Right;   [place] REMOVAL Left 08/06/2018   Procedure: REMOVAL OF LEFT ARM GRAFT;  Surgeon: Roger Posner, MD;  Location: [facility];  Service: Vascular;  Laterality: Left;   INSERTION OF DIALYSIS CATHETER Right 08/08/2018   Procedure: INSERTION OF TUNNELED  DIALYSIS CATHETER;  Surgeon: Roger Heck, MD;  Location: [facility];  Service: Vascular;  Laterality: Right;   IR DIALY SHUNT INTRO [place] W/IMG RIGHT Right 05/26/2021   IR REMOVAL TUN CV CATH W/O FL  02/19/2019   IR US GUIDE VASC ACCESS RIGHT  05/26/2021   LUNG SURGERY     patient not if they removed part of lung. Was done at [facility] Left 04/28/2021   Procedure: PERIPHERAL VASCULAR BALLOON ANGIOPLASTY;  Surgeon: Roger Robins, MD;  Location: [facility];  Service: Cardiovascular;  Laterality: Left;  popliteal and posterial tibial   TEE WITHOUT CARDIOVERSION N/A 07/31/2018   Procedure: TRANSESOPHAGEAL ECHOCARDIOGRAM (TEE);  Surgeon: Roger Casino, MD;  Location: [facility];  Service: Cardiovascular;  Laterality: N/A;   TRANSMETATARSAL AMPUTATION Left 05/22/2021   Procedure: LEFT TRANSMETATARSAL AMPUTATION;  Surgeon: Roger Dutch, MD;  Location: [facility];  Service: Vascular;  Laterality: Left;  History reviewed. No pertinent family history.    reports that he quit smoking about 21 years ago. His smoking use included cigarettes. He has a 5.00 pack-year smoking history. He quit smokeless tobacco use about 6 years ago. He reports current alcohol use of about 2.0 standard drinks per week. He reports current drug use. Drug: Marijuana.   Allergies  Allergen Reactions   Sildenafil Other (See  Comments)    Pt stated, "Makes my Blood Pressure drop"    Prior to Admission medications   Medication Sig Start Date End Date Taking? Authorizing Provider  acetaminophen (TYLENOL) 325 MG tablet Take 650 mg by mouth every 6 (six) hours.   Yes [provider]  aspirin 81 MG chewable tablet Chew 81 mg by mouth daily. (0800)   Yes [provider]  calcium acetate (PHOSLO) 667 MG capsule Take 667 mg by mouth 3 (three) times daily with meals. (0800, 1300 & 1700)   Yes [provider]  clopidogrel (PLAVIX) 75 MG tablet Take 1 tablet (75 mg total) by mouth daily. 05/05/21  Yes Roger Essex, MD  ferrous sulfate 325 (65 FE) MG tablet Take 325 mg by mouth daily with breakfast.   Yes [provider]  hydrocortisone (ANUSOL-HC) 25 MG suppository Place 25 mg rectally 2 (two) times daily as needed for hemorrhoids or anal itching.   Yes [provider]  lidocaine-prilocaine (EMLA) cream Apply 1 application topically See admin instructions. As needed on dialysis days.Laurine Blazer and Friday) 04/28/21  Yes [provider]  Multiple Vitamin (MULITIVITAMIN WITH MINERALS) TABS Take 1 tablet by mouth daily. 10/12/11  Yes Roger Lis, MD  Nutritional Supplements (NOVASOURCE RENAL PO) Take 237 mLs by mouth daily.   Yes [provider]  polyethylene glycol (MIRALAX / GLYCOLAX) 17 g packet Take 17 g by mouth 2 (two) times daily. 05/05/21  Yes Roger Essex, MD  senna (SENOKOT) 8.6 MG TABS tablet Take 1 tablet (8.6 mg total) by mouth daily. 05/05/21  Yes Roger Essex, MD  acetaminophen (TYLENOL) 500 MG tablet Take 1 tablet (500 mg total) by mouth every 6 (six) hours. Patient not taking: Reported on 08/12/2021 05/05/21   Roger Essex, MD  Doxercalciferol (HECTOROL IV) Doxercalciferol (Hectorol) 06/02/21 06/01/22  [provider]  heparin 1000 unit/mL SOLN injection Heparin Sodium (Porcine) 1,000 Units/mL Systemic 06/02/21 06/01/22  [provider]      Results for orders placed or performed during the [facility] encounter of 08/11/21 (from the past 48 hour(s))  CBC with Differential     Status: Abnormal   Collection Time: 08/11/21  6:02 PM  Result Value Ref Range   WBC 5.2 4.0 - 10.5 K/uL   RBC 4.38 4.22 - 5.81 MIL/uL   Hemoglobin 14.4 13.0 - 17.0 g/dL   HCT 43.1 39.0 - 52.0 %   MCV 98.4 80.0 - 100.0 fL   MCH 32.9 26.0 - 34.0 pg   MCHC 33.4 30.0 - 36.0 g/dL   RDW 16.5 (H) 11.5 - 15.5 %   Platelets 141 (L) 150 - 400 K/uL   nRBC 0.0 0.0 - 0.2 %   Neutrophils Relative % 72 %   Neutro Abs 3.8 1.7 - 7.7 K/uL   Lymphocytes Relative 14 %   Lymphs Abs 0.7 0.7 - 4.0 K/uL   Monocytes Relative 10 %   Monocytes Absolute 0.5 0.1 - 1.0 K/uL   Eosinophils Relative 2 %   Eosinophils Absolute 0.1 0.0 - 0.5 K/uL   Basophils Relative 1 %  Basophils Absolute 0.1 0.0 - 0.1 K/uL   Immature Granulocytes 1 %   Abs Immature Granulocytes 0.03 0.00 - 0.07 K/uL    Comment: Performed at [facility], [place] [address], [place], [place] [address]  Comprehensive metabolic panel     Status: Abnormal   Collection Time: 08/11/21  6:02 PM  Result Value Ref Range   Sodium 138 135 - 145 mmol/L   Potassium 3.3 (L) 3.5 - 5.1 mmol/L   Chloride 94 (L) 98 - 111 mmol/L   CO2 29 22 - 32 mmol/L   Glucose, Bld 140 (H) 70 - 99 mg/dL    Comment: Glucose reference range applies only to samples taken after fasting for at least 8 hours.   BUN 14 8 - 23 mg/dL   Creatinine, Ser 3.76 (H) 0.61 - 1.24 mg/dL   Calcium 8.7 (L) 8.9 - 10.3 mg/dL   Total Protein 8.5 (H) 6.5 - 8.1 g/dL   Albumin 3.6 3.5 - 5.0 g/dL   AST 26 15 - 41 U/L   ALT 19 0 - 44 U/L   Alkaline Phosphatase 100 38 - 126 U/L   Total Bilirubin 0.8 0.3 - 1.2 mg/dL   GFR, Estimated 16 (L) >60 mL/min    Comment: (NOTE) Calculated using the CKD-EPI Creatinine Equation (2021)    Anion gap 15 5 - 15    Comment: Performed at [facility], [place] [address], [place], [place] [address]   Lactic acid, plasma     Status: None   Collection Time: 08/11/21  6:02 PM  Result Value Ref Range   Lactic Acid, Venous 1.5 0.5 - 1.9 mmol/L    Comment: Performed at [facility] [address] [place], [place] [address]  Resp Panel by RT-PCR (Flu A&B, Covid) Nasopharyngeal Swab     Status: Abnormal   Collection Time: 08/11/21  6:02 PM   Specimen: Nasopharyngeal Swab; Nasopharyngeal(NP) swabs in vial transport medium  Result Value Ref Range   SARS Coronavirus 2 by RT PCR POSITIVE (A) NEGATIVE    Comment: RESULT CALLED TO, READ BACK BY AND VERIFIED WITH: OLDLAND,RN@0003  08/12/21 [place] (NOTE) SARS-CoV-2 target nucleic acids are DETECTED.  The SARS-CoV-2 RNA is generally detectable in upper respiratory specimens during the acute phase of infection. Positive results are indicative of the presence of the identified virus, but do not rule out bacterial infection or co-infection with other pathogens not detected by the test. Clinical correlation with patient history and other diagnostic information is necessary to determine patient infection status. The expected result is Negative.  Fact Sheet for Patients: EntrepreneurPulse.com.au  Fact Sheet for Healthcare Providers: IncredibleEmployment.be  This test is not yet approved or cleared by the [place] FDA and  has been authorized for detection and/or diagnosis of SARS-CoV-2 by FDA under an Emergency Use Authorization (EUA).  This EUA will remain in effect (meaning this test can be used)  for the duration of  the COVID-19 declaration under Section 564(b)(1) of the Act, 21 U.S.C. section 360bbb-3(b)(1), unless the authorization is terminated or revoked sooner.     Influenza A by PCR NEGATIVE NEGATIVE   Influenza B by PCR NEGATIVE NEGATIVE    Comment: (NOTE) The Xpert Xpress SARS-CoV-2/FLU/RSV plus assay is intended as an aid in the diagnosis of influenza from Nasopharyngeal swab specimens  and should not be used as a sole basis for treatment. Nasal washings and aspirates are unacceptable for Xpert Xpress SARS-CoV-2/FLU/RSV testing.  Fact Sheet for Patients: EntrepreneurPulse.com.au  Fact Sheet for Healthcare Providers: IncredibleEmployment.be  This test is not yet approved or cleared by the [place] FDA and has been authorized for detection and/or diagnosis of SARS-CoV-2 by FDA under an Emergency Use Authorization (EUA). This EUA will remain in effect (meaning this test can be used) for the duration of the COVID-19 declaration under Section 564(b)(1) of the Act, 21 U.S.C. section 360bbb-3(b)(1), unless the authorization is terminated or revoked.  Performed at [facility], [place] [address], [place], [place] [address]   Culture, blood (routine x 2)     Status: None (Preliminary result)   Collection Time: 08/11/21  6:15 PM   Specimen: BLOOD LEFT ARM  Result Value Ref Range   Specimen Description BLOOD LEFT ARM    Special Requests      BOTTLES DRAWN AEROBIC AND ANAEROBIC Blood Culture adequate volume   Culture      NO GROWTH < 24 HOURS Performed at [facility], [place] [address] [place], [place] [address]    Report Status PENDING   Lactic acid, plasma     Status: None   Collection Time: 08/12/21  1:11 AM  Result Value Ref Range   Lactic Acid, Venous 1.2 0.5 - 1.9 mmol/L    Comment: Performed at [facility] [address] [place], [place] [address]  D-dimer, quantitative     Status: Abnormal   Collection Time: 08/12/21  1:11 AM  Result Value Ref Range   D-Dimer, Quant 3.20 (H) 0.00 - 0.50 ug/mL-FEU    Comment: (NOTE) At the manufacturer cut-off value of 0.5 g/mL FEU, this assay has a negative predictive value of 95-100%.This assay is intended for use in conjunction with a clinical pretest probability (PTP) assessment model to exclude pulmonary embolism (PE) and deep venous thrombosis (DVT) in  outpatients suspected of PE or DVT. Results should be correlated with clinical presentation. Performed at [facility], [place] [address], [place],  [place] [address]   Procalcitonin     Status: None   Collection Time: 08/12/21  1:11 AM  Result Value Ref Range   Procalcitonin 1.18 ng/mL    Comment:        Interpretation: PCT > 0.5 ng/mL and <= 2 ng/mL: Systemic infection (sepsis) is possible, but other conditions are known to elevate PCT as well. (NOTE)       Sepsis PCT Algorithm           Lower Respiratory Tract                                      Infection PCT Algorithm    ----------------------------     ----------------------------         PCT < 0.25 ng/mL                PCT < 0.10 ng/mL          Strongly encourage             Strongly discourage   discontinuation of antibiotics    initiation of antibiotics    ----------------------------     -----------------------------       PCT 0.25 - 0.50 ng/mL            PCT 0.10 - 0.25 ng/mL               OR       >80% decrease in  PCT            Discourage initiation of                                            antibiotics      Encourage discontinuation           of antibiotics    ----------------------------     -----------------------------         PCT >= 0.50 ng/mL              PCT 0.26 - 0.50 ng/mL                AND       <80% decrease in PCT             Encourage initiation of                                             antibiotics       Encourage continuation           of antibiotics    ----------------------------     -----------------------------        PCT >= 0.50 ng/mL                  PCT > 0.50 ng/mL               AND         increase in PCT                  Strongly encourage                                      initiation of antibiotics    Strongly encourage escalation           of antibiotics                                     -----------------------------                                           PCT <=  0.25 ng/mL                                                 OR                                        > 80% decrease in PCT                                      Discontinue / Do not initiate  antibiotics  Performed at [facility], [place] [address] [place], [place] [address]   Lactate dehydrogenase     Status: None   Collection Time: 08/12/21  1:11 AM  Result Value Ref Range   LDH 190 98 - 192 U/L    Comment: Performed at [facility], [place] [address], [place], [place] [address]  Ferritin     Status: Abnormal   Collection Time: 08/12/21  1:11 AM  Result Value Ref Range   Ferritin 971 (H) 24 - 336 ng/mL    Comment: Performed at [facility], [place] [address] [place], [place] [address]  Triglycerides     Status: None   Collection Time: 08/12/21  1:11 AM  Result Value Ref Range   Triglycerides 80 <150 mg/dL    Comment: Performed at [facility] [address] [place], [place] [address]  Fibrinogen     Status: None   Collection Time: 08/12/21  1:11 AM  Result Value Ref Range   Fibrinogen 384 210 - 475 mg/dL    Comment: (NOTE) Fibrinogen results may be underestimated in patients receiving thrombolytic therapy. Performed at [facility], [place] [address] [place], [place] [address]   C-reactive protein     Status: Abnormal   Collection Time: 08/12/21  1:11 AM  Result Value Ref Range   CRP 5.6 (H) <1.0 mg/dL    Comment: Performed at [facility] [address] [place], [place] [address]  Troponin I (High Sensitivity)     Status: Abnormal   Collection Time: 08/12/21  1:11 AM  Result Value Ref Range   Troponin I (High Sensitivity) 37 (H) <18 ng/L    Comment: (NOTE) Elevated high sensitivity troponin I (hsTnI) values and significant  changes across serial measurements may suggest ACS but many other  chronic and acute conditions are known to elevate hsTnI results.  Refer to the "Links" section for chest  pain algorithms and additional  guidance. Performed at [facility], [place] [address], [place], [place] [address]   Troponin I (High Sensitivity)     Status: Abnormal   Collection Time: 08/12/21  3:03 AM  Result Value Ref Range   Troponin I (High Sensitivity) 40 (H) <18 ng/L    Comment: (NOTE) Elevated high sensitivity troponin I (hsTnI) values and significant  changes across serial measurements may suggest ACS but many other  chronic and acute conditions are known to elevate hsTnI results.  Refer to the "Links" section for chest pain algorithms and additional  guidance. Performed at [facility], [place] [address], [place], [place] [address]   Magnesium     Status: None   Collection Time: 08/12/21  3:03 AM  Result Value Ref Range   Magnesium 2.4 1.7 - 2.4 mg/dL    Comment: Performed at [facility] [address] [place], [place] [address]  TSH     Status: None   Collection Time: 08/12/21  3:03 AM  Result Value Ref Range   TSH 2.598 0.350 - 4.500 uIU/mL    Comment: Performed by a 3rd Generation assay with a functional sensitivity of <=0.01 uIU/mL. Performed at [facility], [place] [address], [place],  [address]   Vitamin B12     Status: None   Collection Time: 08/12/21  3:03 AM  Result Value Ref Range   Vitamin B-12 585 180 - 914 pg/mL    Comment: (NOTE) This assay is  not validated for testing neonatal or myeloproliferative syndrome specimens for Vitamin B12 levels. Performed at [facility], [place] [address], [place], [place] [address]   Ammonia     Status: None   Collection Time: 08/12/21  3:03 AM  Result Value Ref Range   Ammonia 24 9 - 35 umol/L    Comment: Performed at [facility], [place] [address] [place], [place] [address]  Troponin I (High Sensitivity)     Status: Abnormal   Collection Time: 08/12/21  6:45 AM  Result Value Ref Range   Troponin I (High Sensitivity) 45 (H) <18 ng/L    Comment: (NOTE) Elevated high  sensitivity troponin I (hsTnI) values and significant  changes across serial measurements may suggest ACS but many other  chronic and acute conditions are known to elevate hsTnI results.  Refer to the "Links" section for chest pain algorithms and additional  guidance. Performed at [facility], [place] [address], [place], [place] [address]   CBG monitoring, ED     Status: None   Collection Time: 08/12/21  8:27 AM  Result Value Ref Range   Glucose-Capillary 90 70 - 99 mg/dL    Comment: Glucose reference range applies only to samples taken after fasting for at least 8 hours.  CBC     Status: Abnormal   Collection Time: 08/12/21  8:53 AM  Result Value Ref Range   WBC 3.7 (L) 4.0 - 10.5 K/uL   RBC 3.71 (L) 4.22 - 5.81 MIL/uL   Hemoglobin 11.7 (L) 13.0 - 17.0 g/dL   HCT 37.5 (L) 39.0 - 52.0 %   MCV 101.1 (H) 80.0 - 100.0 fL   MCH 31.5 26.0 - 34.0 pg   MCHC 31.2 30.0 - 36.0 g/dL   RDW 16.6 (H) 11.5 - 15.5 %   Platelets 119 (L) 150 - 400 K/uL    Comment: Immature Platelet Fraction may be clinically indicated, consider ordering this additional test [id number] REPEATED TO VERIFY PLATELET COUNT CONFIRMED BY SMEAR    nRBC 0.0 0.0 - 0.2 %    Comment: Performed at [facility], [place] [address] [place], [place] [address]  Renal function panel     Status: Abnormal   Collection Time: 08/12/21  8:53 AM  Result Value Ref Range   Sodium 138 135 - 145 mmol/L   Potassium 2.9 (L) 3.5 - 5.1 mmol/L   Chloride 101 98 - 111 mmol/L   CO2 26 22 - 32 mmol/L   Glucose, Bld 87 70 - 99 mg/dL    Comment: Glucose reference range applies only to samples taken after fasting for at least 8 hours.   BUN 14 8 - 23 mg/dL   Creatinine, Ser 3.98 (H) 0.61 - 1.24 mg/dL   Calcium 7.0 (L) 8.9 - 10.3 mg/dL   Phosphorus 3.1 2.5 - 4.6 mg/dL   Albumin 2.5 (L) 3.5 - 5.0 g/dL   GFR, Estimated 15 (L) >60 mL/min    Comment: (NOTE) Calculated using the CKD-EPI Creatinine Equation (2021)    Anion gap 11 5 - 15     Comment: Performed at [facility] [address] [place],  [address]  CBG monitoring, ED     Status: Abnormal   Collection Time: 08/12/21 12:55 PM  Result Value Ref Range   Glucose-Capillary 137 (H) 70 - 99 mg/dL    Comment: Glucose reference range applies only to samples taken after fasting for at least 8 hours.  ROS: See [facility]   Physical Exam: Vitals:   08/12/21 1300 08/12/21 1452  BP: (!) 171/92   Pulse: 94   Resp: (!) 21   Temp:  98.7 F (37.1 C)  SpO2: 100%      General: Thin alert chronically ill African-American male NAD HEENT: [place] [facility], nonicteric, MMM Roger: Supple no JVD Heart: RRR no MRG Lungs: CTA bilaterally nonlabored breathing O2 sat her percent room air Abdomen: NABS, soft NTND Extremities: Left BKA stump wrapped, right no pedal edema Skin: Warm dry no acute rash appreciated Neuro: Alert, O x3 no acute focal deficits appreciated Dialysis Access: Positive bruit right forearm AV Gore-Tex graft  Dialysis Orders: [facility]: [facility], MWF, 3 hours 15 minutes [place] 2K 2 CA EDW 56 kg Heparin 1700 Mircera 150 MCG every 2 weeks last given 1019/22 R FA  AV GG   Assessment/Plan Acute metabolic encephalopathy work-up per admit possible COVID-19 infection= currently about baseline MS, has improved  ESRD -HD MWF, continue schedule next dialysis Monday no acute dialysis needs Mild hypokalemia= K2.9 this a.m. treated with p.o. 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   Jannifer Hick MD [facility] Pager [phone]

## 2021-08-12 NOTE — Progress Notes (Signed)
VASCULAR LAB    Bilateral lower extremity venous duplex has been performed.  See CV proc for preliminary results.   Messaged results to Dr. Sloan Leiter via secure chat.  Sereen Schaff, RVT 08/12/2021, 1:48 PM

## 2021-08-12 NOTE — H&P (Signed)
History and Physical    Roger Vazquez PXT:[id number] DOB: 10-24-1950 DOA: 08/11/2021  PCP: [facility] Patient coming from: Dialysis center  Chief Complaint: Altered mental status  HPI: Roger Vazquez is a 70 y.o. male with medical history significant of ESRD on HD MWF, hypertension, non-insulin-dependent type 2 diabetes, history of lung cancer, PAD, polysubstance abuse.  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  He presents to the ED from his dialysis center for evaluation of fever and confusion.  Reportedly having cough.  Febrile to 102.1 F upon arrival.  Not tachycardic or hypotensive.  Not hypoxic. SARS-CoV-2 PCR test positive.  Labs showing WBC 5.2, hemoglobin 14.4, platelet count 141k.  Sodium 138, potassium 3.3, chloride 94, bicarb 29, BUN 14, creatinine 3.7, glucose 140.  LFTs normal.  Initial lactic acid normal, repeat pending.  Blood cultures drawn.  Chest x-ray not suggestive of pneumonia.  Head CT negative for acute intracranial process.  EKG showing new T wave inversions and ST depressions in lateral leads.  No troponin checked.  Patient reports 1 week history of cough, shortness of breath, and watery diarrhea.  Denies chest pain.  Denies nausea, vomiting, or abdominal pain.  Denies recent antibiotic use.  Today at dialysis he was told he had a fever.  He has received 2 doses of the COVID-vaccine but no booster shot yet.  No other complaints.  Review of Systems:  All systems reviewed and apart from history of presenting illness, are negative.  Past Medical History:  Diagnosis Date   Anemia    Blood dyscrasia    history of coagulation deficit per [facility] nursing home   Cancer [facility])    Lung   Diabetes mellitus    Hepatitis    Hypertension    Renal disorder    MWF- [facility]    Past Surgical History:  Procedure Laterality Date   ABDOMINAL AORTOGRAM W/LOWER EXTREMITY Left 04/28/2021   Procedure: ABDOMINAL  AORTOGRAM W/LOWER EXTREMITY;  Surgeon: Cherre Robins, MD;  Location: [place] [facility];  Service: Cardiovascular;  Laterality: Left;   AMPUTATION Left 05/04/2021   Procedure: LEFT SECOND TOE AMPUTATION;  Surgeon: Cherre Robins, MD;  Location: [place];  Service: Vascular;  Laterality: Left;   AMPUTATION Left 06/08/2021   Procedure: LEFT BELOW KNEE AMPUTATION;  Surgeon: Cherre Robins, MD;  Location: [place];  Service: Vascular;  Laterality: Left;   APPLICATION OF WOUND VAC Left 06/08/2021   Procedure: APPLICATION OF WOUND VAC;  Surgeon: Cherre Robins, MD;  Location: [place];  Service: Vascular;  Laterality: Left;   artiovenous graft placement Left    AV FISTULA PLACEMENT Right 11/27/2018   Procedure: INSERTION 4-7MM X 45CM GORETEX GRAFT RIGHT FOREARM ;  Surgeon: Rosetta Posner, MD;  Location: [place];  Service: Vascular;  Laterality: Right;   [place] REMOVAL Left 08/06/2018   Procedure: REMOVAL OF LEFT ARM GRAFT;  Surgeon: Rosetta Posner, MD;  Location: [place];  Service: Vascular;  Laterality: Left;   INSERTION OF DIALYSIS CATHETER Right 08/08/2018   Procedure: INSERTION OF TUNNELED  DIALYSIS CATHETER;  Surgeon: Marty Heck, MD;  Location: [place];  Service: Vascular;  Laterality: Right;   IR DIALY SHUNT INTRO [place] W/IMG RIGHT Right 05/26/2021   IR REMOVAL TUN CV CATH W/O FL  02/19/2019   IR US GUIDE VASC ACCESS RIGHT  05/26/2021   LUNG SURGERY     patient not if they  removed part of lung. Was done at [place] Left 04/28/2021   Procedure: PERIPHERAL VASCULAR BALLOON ANGIOPLASTY;  Surgeon: Cherre Robins, MD;  Location: [place] [facility];  Service: Cardiovascular;  Laterality: Left;  popliteal and posterial tibial   TEE WITHOUT CARDIOVERSION N/A 07/31/2018   Procedure: TRANSESOPHAGEAL ECHOCARDIOGRAM (TEE);  Surgeon: Pixie Casino, MD;  Location: [facility] ENDOSCOPY;  Service: Cardiovascular;  Laterality: N/A;   TRANSMETATARSAL AMPUTATION  Left 05/22/2021   Procedure: LEFT TRANSMETATARSAL AMPUTATION;  Surgeon: Elam Dutch, MD;  Location: [facility] OR;  Service: Vascular;  Laterality: Left;     reports that he quit smoking about 21 years ago. His smoking use included cigarettes. He has a 5.00 pack-year smoking history. He quit smokeless tobacco use about 6 years ago. He reports current alcohol use of about 2.0 standard drinks per week. He reports current drug use. Drug: Marijuana.  Allergies  Allergen Reactions   Sildenafil Other (See Comments)    Pt stated, "Makes my Blood Pressure drop"    History reviewed. No pertinent family history.  Prior to Admission medications   Medication Sig Start Date End Date Taking? Authorizing Provider  acetaminophen (TYLENOL) 500 MG tablet Take 1 tablet (500 mg total) by mouth every 6 (six) hours. 05/05/21   Ezequiel Essex, MD  aspirin 81 MG chewable tablet Chew 81 mg by mouth daily. (0800)    [provider]  calcium acetate (PHOSLO) 667 MG capsule Take 667 mg by mouth 3 (three) times daily with meals. (0800, 1300 & 1700)    [provider]  clopidogrel (PLAVIX) 75 MG tablet Take 1 tablet (75 mg total) by mouth daily. 05/05/21   Ezequiel Essex, MD  Doxercalciferol (HECTOROL IV) Doxercalciferol (Hectorol) 06/02/21 06/01/22  [provider]  heparin 1000 unit/mL SOLN injection Heparin Sodium (Porcine) 1,000 Units/mL Systemic 06/02/21 06/01/22  [provider]  lidocaine-prilocaine (EMLA) cream Apply 1 application topically as needed. 04/28/21   [provider]  Multiple Vitamin (MULITIVITAMIN WITH MINERALS) TABS Take 1 tablet by mouth daily. 10/12/11   Robbie Lis, MD  polyethylene glycol (MIRALAX / GLYCOLAX) 17 g packet Take 17 g by mouth 2 (two) times daily. 05/05/21   Ezequiel Essex, MD  senna (SENOKOT) 8.6 MG TABS tablet Take 1 tablet (8.6 mg total) by mouth daily. 05/05/21   Ezequiel Essex, MD    Physical Exam: Vitals:   08/11/21 1747 08/11/21  2114 08/11/21 2222 08/12/21 0000  BP: 139/85 135/80 (!) 156/81 (!) 157/79  Pulse: 99 89 92 85  Resp: 18 16 17 17   Temp: (!) 102.1 F (38.9 C)  98.7 F (37.1 C)   TempSrc: Oral  Oral   SpO2: 99% 98% 100% 100%  Weight:   62.6 kg   Height:   5\' 6"  (1.676 m)     Physical Exam Constitutional:      General: He is not in acute distress. HENT:     Head: Normocephalic and atraumatic.  Eyes:     Extraocular Movements: Extraocular movements intact.     Conjunctiva/sclera: Conjunctivae normal.  Cardiovascular:     Rate and Rhythm: Normal rate and regular rhythm.     Pulses: Normal pulses.  Pulmonary:     Effort: Pulmonary effort is normal. No respiratory distress.     Breath sounds: Normal breath sounds. No wheezing or rales.     Comments: Coughing Abdominal:     General: Bowel sounds are normal. There is no distension.  Palpations: Abdomen is soft.     Tenderness: There is no abdominal tenderness.  Musculoskeletal:        General: No swelling or tenderness.     Cervical back: Normal range of motion and neck supple.     Comments: No obvious signs of infection at left lower extremity amputation stump site  Skin:    General: Skin is warm and dry.  Neurological:     General: No focal deficit present.     Mental Status: He is alert and oriented to person, place, and time.     Labs on Admission: I have personally reviewed following labs and imaging studies  CBC: Recent Labs  Lab 08/11/21 1802  WBC 5.2  NEUTROABS 3.8  HGB 14.4  HCT 43.1  MCV 98.4  PLT 614*   Basic Metabolic Panel: Recent Labs  Lab 08/11/21 1802  NA 138  K 3.3*  CL 94*  CO2 29  GLUCOSE 140*  BUN 14  CREATININE 3.76*  CALCIUM 8.7*   GFR: Estimated Creatinine Clearance: 16 mL/min (A) (by C-G formula based on SCr of 3.76 mg/dL (H)). Liver Function Tests: Recent Labs  Lab 08/11/21 1802  AST 26  ALT 19  ALKPHOS 100  BILITOT 0.8  PROT 8.5*  ALBUMIN 3.6   No results for input(s): LIPASE,  AMYLASE in the last 168 hours. No results for input(s): AMMONIA in the last 168 hours. Coagulation Profile: No results for input(s): INR, PROTIME in the last 168 hours. Cardiac Enzymes: No results for input(s): CKTOTAL, CKMB, CKMBINDEX, TROPONINI in the last 168 hours. BNP (last 3 results) No results for input(s): PROBNP in the last 8760 hours. HbA1C: No results for input(s): HGBA1C in the last 72 hours. CBG: No results for input(s): GLUCAP in the last 168 hours. Lipid Profile: Recent Labs    08/12/21 0111  TRIG 80   Thyroid Function Tests: No results for input(s): TSH, T4TOTAL, FREET4, T3FREE, THYROIDAB in the last 72 hours. Anemia Panel: No results for input(s): VITAMINB12, FOLATE, FERRITIN, TIBC, IRON, RETICCTPCT in the last 72 hours. Urine analysis:    Component Value Date/Time   COLORURINE YELLOW 04/27/2021 2056   APPEARANCEUR CLOUDY (A) 04/27/2021 2056   LABSPEC 1.012 04/27/2021 2056   PHURINE 8.0 04/27/2021 2056   GLUCOSEU 50 (A) 04/27/2021 2056   HGBUR NEGATIVE 04/27/2021 2056   BILIRUBINUR NEGATIVE 04/27/2021 2056   KETONESUR NEGATIVE 04/27/2021 2056   PROTEINUR 100 (A) 04/27/2021 2056   UROBILINOGEN 0.2 10/07/2011 1618   NITRITE NEGATIVE 04/27/2021 2056   LEUKOCYTESUR LARGE (A) 04/27/2021 2056    Radiological Exams on Admission: DG Chest 2 View  Result Date: 08/11/2021 CLINICAL DATA:  Fever.  Disoriented. EXAM: CHEST - 2 VIEW COMPARISON:  One-view chest x-ray 05/27/2021 FINDINGS: Heart is mildly enlarged. Atherosclerotic calcifications present at the aortic arch. Left hemidiaphragm is chronically elevated. The lungs are clear. Axial skeleton is unremarkable. IMPRESSION: No acute cardiopulmonary disease. Electronically Signed   By: San Morelle M.D.   On: 08/11/2021 19:05   CT HEAD WO CONTRAST (5MM)  Result Date: 08/11/2021 CLINICAL DATA:  Altered level of consciousness EXAM: CT HEAD WITHOUT CONTRAST TECHNIQUE: Contiguous axial images were obtained  from the base of the skull through the vertex without intravenous contrast. COMPARISON:  05/19/2021 FINDINGS: Brain: No acute infarct or hemorrhage. Stable chronic small vessel ischemic changes within the periventricular white matter and basal ganglia. Lateral ventricles and midline structures are unremarkable. No acute extra-axial fluid collections. No mass effect. Vascular: Stable atherosclerosis.  No hyperdense vessel. Skull: Normal. Negative for fracture or focal lesion. Sinuses/Orbits: No acute finding. Other: None. IMPRESSION: 1. Stable head CT, no acute intracranial process. Electronically Signed   By: Randa Ngo M.D.   On: 08/11/2021 19:16    EKG: Independently reviewed.  Sinus or ectopic atrial rhythm, first-degree AV block, QTC 508.  New ST depressions and T wave inversions in lateral leads.  Assessment/Plan Principal Problem:   COVID-19 Active Problems:   HTN (hypertension)   ESRD on hemodialysis (HCC)   Peripheral vascular disease (HCC)   Acute metabolic encephalopathy   COVID-19 infection SARS-CoV-2 PCR test positive.  He has received 2 initial doses of COVID-vaccine but no booster shot yet.  Endorsing cough and shortness of breath x1 week.  Febrile with temperature 102.1 F on arrival.  No tachycardia, hypotension, or lactic acidosis to suggest sepsis.  Chest x-ray not suggestive of pneumonia.  Not hypoxic, satting 100% on room air at present.  CRP 5.6, ferritin 971, D-dimer 3.2.  Elevated D-dimer likely due to COVID infection in the setting of ESRD, PE less likely given no hypoxia or tachycardia. -Remdesivir -Steroids not indicated as patient is not hypoxic -Antitussive as needed for cough -Tylenol as needed for fevers -Airborne and contact precautions -Continuous pulse ox, supplemental oxygen as needed to keep oxygen saturation above 92% -Blood cultures drawn and pending -Trend D-dimer and CRP -Bilateral lower extremity Dopplers ordered to rule out DVT  Diarrhea Likely  due to COVID infection.  Patient denies recent antibiotic use.  No leukocytosis or abdominal pain. -GI pathogen panel; enteric precautions  Acute metabolic encephalopathy Likely due to COVID infection.  Currently AAO x4 and answering questions.  No focal neurodeficit.  Head CT negative for acute intracranial process. -Check TSH, B12, ammonia levels  EKG changes EKG showing new T wave inversions and ST depressions in lateral leads.  High-sensitivity troponin mildly elevated at 37.  ACS less likely as patient denies chest pain. -Cardiac monitoring, trend troponin  ESRD on HD MWF Sent to the ED from his dialysis center, unclear if he finished his dialysis session.  Potassium 3.3, bicarb 29.  Does not appear volume overloaded. -Consult nephrology during daytime.  Non-insulin-dependent type 2 diabetes A1c 7.9 on 04/27/2021. -Very sensitive sliding scale insulin ACHS.  Hypertension PAD -Pharmacy med rec pending.  DVT prophylaxis: Subcutaneous heparin Code Status: Full code Family Communication: No family available at this time. Disposition Plan: Status is: Observation  The patient remains OBS appropriate and will d/c before 2 midnights.  Level of care: Level of care: Telemetry Medical  The medical decision making on this patient was of high complexity and the patient is at high risk for clinical deterioration, therefore this is a level 3 visit.  Shela Leff MD [facility]  If 7PM-7AM, please contact night-coverage [email or web address]  08/12/2021, 2:03 AM

## 2021-08-12 NOTE — Progress Notes (Addendum)
PROGRESS NOTE        PATIENT DETAILS Name: Roger Vazquez Age: 70 y.o. Sex: male Date of Birth: July 16, 1951 Admit Date: 08/11/2021 Admitting Physician Shela Leff, MD [id number]  Brief Narrative: Patient is a 70 y.o. male with history of ESRD on HD MWF, PAD-s/p left BKA, DM-2, HTN-who presented from his hemodialysis center for evaluation of altered mental status, he was found to have COVID-19 infection.  Subjective: Mental status has improved overnight-he appears frail/weak but is awake and alert.  Answering simple questions appropriately.  Objective: Vitals: Blood pressure (!) 156/82, pulse (!) 103, temperature 98.1 F (36.7 C), temperature source Oral, resp. rate 13, height 5\' 6"  (1.676 m), weight 62.6 kg, SpO2 100 %.   Exam: Gen Exam:Alert awake-not in any distress-looks frail/weak. HEENT:atraumatic, normocephalic Chest: B/L clear to auscultation anteriorly CVS:S1S2 regular Abdomen:soft non tender, non distended Extremities left BKA Neurology: Non focal Skin: no rash  Pertinent Labs/Radiology: WBC: 1 7 Hb: 11.7 Platelet: 109 K: 2.9  10/28>>Blood culture: No growth.  10/28>> CT head: No acute intracranial abnormality. 10/28>>CXR:No PNA  Assessment/Plan: Acute metabolic encephalopathy: Likely due to COVID-19 infection-seems to have improved.  Neuroimaging negative.  Continue supportive care.  COVID 19 infection: Continue Remdesivir-we will plan on 3 days.  Not hypoxic-no pneumonia-does not require steroids.  Mild pancytopenia: Likely reflective of COVID-19 infection-should improve with time.  Follow.  Hypokalemia: Replete and recheck.  ESRD on HD MWF: Labs/volume status stable-we will consult nephrology.  PAD-s/p left BKA: Continue stump care-no dehiscence but there appears to be a small open area-we will consult wound care.  Continue aspirin/Plavix/statin.  Addendum: Informed by the vascular tech that patient  has nonocclusive DVT in the left lower extremity-discussed with Dr. Vickii Penna Plavix-continue aspirin-okay to add Eliquis.  HTN: BP on the higher side-continue amlodipine.  Debility/deconditioning: Due to acute illness-suspect has some debility at baseline.  Obtain PT/OT eval.  From a SNF.  Procedures: None Consults: None DVT Prophylaxis: SQ Heparin Code Status:Full code  Family Communication: None at bedside  Time spent: 35 minutes-Greater than 50% of this time was spent in counseling, explanation of diagnosis, planning of further management, and coordination of care.  Diet: Diet Order             Diet renal with fluid restriction Fluid restriction: 1200 mL Fluid; Room service appropriate? Yes; Fluid consistency: Thin  Diet effective now                      Disposition Plan: Status is: Observation  The patient will require care spanning > 2 midnights and should be moved to inpatient because: Inpatient level of care-COVID-19 infection-on IV Remdesivir-debilitated-needs PT/OT before consideration of discharge.   Antimicrobial agents: Anti-infectives (From admission, onward)    Start     Dose/Rate Route Frequency Ordered Stop   08/13/21 1000  remdesivir 100 mg in sodium chloride 0.9 % 100 mL IVPB       See Hyperspace for full Linked Orders Report.   100 mg 200 mL/hr over 30 Minutes Intravenous Daily 08/12/21 0228 08/17/21 0959   08/12/21 0400  remdesivir 200 mg in sodium chloride 0.9% 250 mL IVPB       See Hyperspace for full Linked Orders Report.   200 mg 580 mL/hr over 30 Minutes Intravenous Once 08/12/21 0228 08/12/21 0440  MEDICATIONS: Scheduled Meds:  amLODipine  5 mg Oral Daily   aspirin  81 mg Oral Daily   clopidogrel  75 mg Oral Daily   ferrous sulfate  325 mg Oral Q breakfast   heparin  5,000 Units Subcutaneous Q8H   insulin aspart  0-5 Units Subcutaneous QHS   insulin aspart  0-6 Units Subcutaneous TID WC   polyethylene glycol  17 g Oral  BID   senna  1 tablet Oral Daily   Continuous Infusions:  [START ON 08/13/2021] remdesivir 100 mg in NS 100 mL     PRN Meds:.acetaminophen **OR** acetaminophen, guaiFENesin-dextromethorphan, hydrALAZINE   I have personally reviewed following labs and imaging studies  LABORATORY DATA: CBC: Recent Labs  Lab 08/11/21 1802 08/12/21 0853  WBC 5.2 3.7*  NEUTROABS 3.8  --   HGB 14.4 11.7*  HCT 43.1 37.5*  MCV 98.4 101.1*  PLT 141* 119*    Basic Metabolic Panel: Recent Labs  Lab 08/11/21 1802 08/12/21 0303 08/12/21 0853  NA 138  --  138  K 3.3*  --  2.9*  CL 94*  --  101  CO2 29  --  26  GLUCOSE 140*  --  87  BUN 14  --  14  CREATININE 3.76*  --  3.98*  CALCIUM 8.7*  --  7.0*  MG  --  2.4  --   PHOS  --   --  3.1    GFR: Estimated Creatinine Clearance: 15.1 mL/min (A) (by C-G formula based on SCr of 3.98 mg/dL (H)).  Liver Function Tests: Recent Labs  Lab 08/11/21 1802 08/12/21 0853  AST 26  --   ALT 19  --   ALKPHOS 100  --   BILITOT 0.8  --   PROT 8.5*  --   ALBUMIN 3.6 2.5*   No results for input(s): LIPASE, AMYLASE in the last 168 hours. Recent Labs  Lab 08/12/21 0303  AMMONIA 24    Coagulation Profile: No results for input(s): INR, PROTIME in the last 168 hours.  Cardiac Enzymes: No results for input(s): CKTOTAL, CKMB, CKMBINDEX, TROPONINI in the last 168 hours.  BNP (last 3 results) No results for input(s): PROBNP in the last 8760 hours.  Lipid Profile: Recent Labs    08/12/21 0111  TRIG 80    Thyroid Function Tests: Recent Labs    08/12/21 0303  TSH 2.598    Anemia Panel: Recent Labs    08/12/21 0111 08/12/21 0303  VITAMINB12  --  585  FERRITIN 971*  --     Urine analysis:    Component Value Date/Time   COLORURINE YELLOW 04/27/2021 2056   APPEARANCEUR CLOUDY (A) 04/27/2021 2056   LABSPEC 1.012 04/27/2021 2056   PHURINE 8.0 04/27/2021 2056   GLUCOSEU 50 (A) 04/27/2021 2056   HGBUR NEGATIVE 04/27/2021 2056    BILIRUBINUR NEGATIVE 04/27/2021 2056   KETONESUR NEGATIVE 04/27/2021 2056   PROTEINUR 100 (A) 04/27/2021 2056   UROBILINOGEN 0.2 10/07/2011 1618   NITRITE NEGATIVE 04/27/2021 2056   LEUKOCYTESUR LARGE (A) 04/27/2021 2056    Sepsis Labs: Lactic Acid, Venous    Component Value Date/Time   LATICACIDVEN 1.2 08/12/2021 0111    MICROBIOLOGY: Recent Results (from the past 240 hour(s))  Resp Panel by RT-PCR (Flu A&B, Covid) Nasopharyngeal Swab     Status: Abnormal   Collection Time: 08/11/21  6:02 PM   Specimen: Nasopharyngeal Swab; Nasopharyngeal(NP) swabs in vial transport medium  Result Value Ref Range Status   SARS Coronavirus 2  by RT PCR POSITIVE (A) NEGATIVE Final    Comment: RESULT CALLED TO, READ BACK BY AND VERIFIED WITH: OLDLAND,RN@0003  08/12/21 [place] (NOTE) SARS-CoV-2 target nucleic acids are DETECTED.  The SARS-CoV-2 RNA is generally detectable in upper respiratory specimens during the acute phase of infection. Positive results are indicative of the presence of the identified virus, but do not rule out bacterial infection or co-infection with other pathogens not detected by the test. Clinical correlation with patient history and other diagnostic information is necessary to determine patient infection status. The expected result is Negative.  Fact Sheet for Patients: EntrepreneurPulse.com.au  Fact Sheet for Healthcare Providers: IncredibleEmployment.be  This test is not yet approved or cleared by the [place] FDA and  has been authorized for detection and/or diagnosis of SARS-CoV-2 by FDA under an Emergency Use Authorization (EUA).  This EUA will remain in effect (meaning this test can be used)  for the duration of  the COVID-19 declaration under Section 564(b)(1) of the Act, 21 U.S.C. section 360bbb-3(b)(1), unless the authorization is terminated or revoked sooner.     Influenza A by PCR NEGATIVE NEGATIVE Final   Influenza B  by PCR NEGATIVE NEGATIVE Final    Comment: (NOTE) The Xpert Xpress SARS-CoV-2/FLU/RSV plus assay is intended as an aid in the diagnosis of influenza from Nasopharyngeal swab specimens and should not be used as a sole basis for treatment. Nasal washings and aspirates are unacceptable for Xpert Xpress SARS-CoV-2/FLU/RSV testing.  Fact Sheet for Patients: EntrepreneurPulse.com.au  Fact Sheet for Healthcare Providers: IncredibleEmployment.be  This test is not yet approved or cleared by the [place] FDA and has been authorized for detection and/or diagnosis of SARS-CoV-2 by FDA under an Emergency Use Authorization (EUA). This EUA will remain in effect (meaning this test can be used) for the duration of the COVID-19 declaration under Section 564(b)(1) of the Act, 21 U.S.C. section 360bbb-3(b)(1), unless the authorization is terminated or revoked.  Performed at [facility], [place] [address], [place], [place] [address]   Culture, blood (routine x 2)     Status: None (Preliminary result)   Collection Time: 08/11/21  6:15 PM   Specimen: BLOOD LEFT ARM  Result Value Ref Range Status   Specimen Description BLOOD LEFT ARM  Final   Special Requests   Final    BOTTLES DRAWN AEROBIC AND ANAEROBIC Blood Culture adequate volume   Culture   Final    NO GROWTH < 24 HOURS Performed at [facility], [place] [address] [place], [place] [address]    Report Status PENDING  Incomplete    RADIOLOGY STUDIES/RESULTS: DG Chest 2 View  Result Date: 08/11/2021 CLINICAL DATA:  Fever.  Disoriented. EXAM: CHEST - 2 VIEW COMPARISON:  One-view chest x-ray 05/27/2021 FINDINGS: Heart is mildly enlarged. Atherosclerotic calcifications present at the aortic arch. Left hemidiaphragm is chronically elevated. The lungs are clear. Axial skeleton is unremarkable. IMPRESSION: No acute cardiopulmonary disease. Electronically Signed   By: San Morelle M.D.   On:  08/11/2021 19:05   CT HEAD WO CONTRAST (5MM)  Result Date: 08/11/2021 CLINICAL DATA:  Altered level of consciousness EXAM: CT HEAD WITHOUT CONTRAST TECHNIQUE: Contiguous axial images were obtained from the base of the skull through the vertex without intravenous contrast. COMPARISON:  05/19/2021 FINDINGS: Brain: No acute infarct or hemorrhage. Stable chronic small vessel ischemic changes within the periventricular white matter and basal ganglia. Lateral ventricles and midline structures are unremarkable. No acute extra-axial fluid collections. No mass effect. Vascular: Stable  atherosclerosis.  No hyperdense vessel. Skull: Normal. Negative for fracture or focal lesion. Sinuses/Orbits: No acute finding. Other: None. IMPRESSION: 1. Stable head CT, no acute intracranial process. Electronically Signed   By: Randa Ngo M.D.   On: 08/11/2021 19:16     LOS: 0 days   Oren Binet, MD  [facility]    To contact the attending provider between 7A-7P or the covering provider during after hours 7P-7A, please log into the web site [email or web address] and access using universal [place] password for that web site. If you do not have the password, please call the hospital operator.  08/12/2021, 10:30 AM

## 2021-08-13 ENCOUNTER — Inpatient Hospital Stay (HOSPITAL_COMMUNITY): Payer: No Typology Code available for payment source

## 2021-08-13 DIAGNOSIS — U071 COVID-19: Secondary | ICD-10-CM | POA: Diagnosis not present

## 2021-08-13 DIAGNOSIS — R509 Fever, unspecified: Secondary | ICD-10-CM | POA: Diagnosis not present

## 2021-08-13 DIAGNOSIS — G9341 Metabolic encephalopathy: Secondary | ICD-10-CM | POA: Diagnosis not present

## 2021-08-13 DIAGNOSIS — N186 End stage renal disease: Secondary | ICD-10-CM | POA: Diagnosis not present

## 2021-08-13 LAB — RENAL FUNCTION PANEL
Albumin: 3.2 g/dL — ABNORMAL LOW (ref 3.5–5.0)
Anion gap: 15 (ref 5–15)
BUN: 29 mg/dL — ABNORMAL HIGH (ref 8–23)
CO2: 24 mmol/L (ref 22–32)
Calcium: 8.9 mg/dL (ref 8.9–10.3)
Chloride: 97 mmol/L — ABNORMAL LOW (ref 98–111)
Creatinine, Ser: 5.6 mg/dL — ABNORMAL HIGH (ref 0.61–1.24)
GFR, Estimated: 10 mL/min — ABNORMAL LOW (ref >60)
Glucose, Bld: 153 mg/dL — ABNORMAL HIGH (ref 70–99)
Phosphorus: 3.7 mg/dL (ref 2.5–4.6)
Potassium: 4.4 mmol/L (ref 3.5–5.1)
Sodium: 136 mmol/L (ref 135–145)

## 2021-08-13 LAB — CBC
HCT: 43.3 % (ref 39.0–52.0)
Hemoglobin: 14.3 g/dL (ref 13.0–17.0)
MCH: 32 pg (ref 26.0–34.0)
MCHC: 33 g/dL (ref 30.0–36.0)
MCV: 96.9 fL (ref 80.0–100.0)
Platelets: 155 10*3/uL (ref 150–400)
RBC: 4.47 MIL/uL (ref 4.22–5.81)
RDW: 16.5 % — ABNORMAL HIGH (ref 11.5–15.5)
WBC: 4.8 10*3/uL (ref 4.0–10.5)
nRBC: 0 % (ref 0.0–0.2)

## 2021-08-13 LAB — HEPATITIS B SURFACE ANTIBODY,QUALITATIVE: Hep B S Ab: REACTIVE — AB

## 2021-08-13 LAB — GLUCOSE, CAPILLARY
Glucose-Capillary: 156 mg/dL — ABNORMAL HIGH (ref 70–99)
Glucose-Capillary: 146 mg/dL — ABNORMAL HIGH (ref 70–99)
Glucose-Capillary: 133 mg/dL — ABNORMAL HIGH (ref 70–99)
Glucose-Capillary: 201 mg/dL — ABNORMAL HIGH (ref 70–99)

## 2021-08-13 LAB — HEPATITIS B SURFACE ANTIGEN: Hepatitis B Surface Ag: NONREACTIVE

## 2021-08-13 LAB — D-DIMER, QUANTITATIVE: D-Dimer, Quant: 2.12 ug/mL-FEU — ABNORMAL HIGH (ref 0.00–0.50)

## 2021-08-13 LAB — C-REACTIVE PROTEIN: CRP: 6.3 mg/dL — ABNORMAL HIGH (ref <1.0)

## 2021-08-13 NOTE — Evaluation (Signed)
Physical Therapy Evaluation Patient Details Name: Roger Vazquez MRN: [id number] DOB: 1951-02-15 Today's Date: 08/13/2021  History of Present Illness  70 y.o. male presented to the ED 08/12/21 from his dialysis center for evaluation of fever and confusion. +COVID, head CT negative, +DVT LLE   PMH significant of ESRD on HD MWF, hypertension, non-insulin-dependent type 2 diabetes, history of lung cancer, PAD, polysubstance abuse, left BKA., Hep C  Clinical Impression   Pt admitted secondary to problem above with deficits below. Patient's functional status PTA is currently unknown (he will not answer questions). Patient may have progressed from short-term rehab to long-term care since his admission to [facility] in August 2022 s/p his left BKA operation.  Pt currently requires total assist for limited bed mobility assessment due to pain "all over." RN made aware of pt's status and request for pain medication.  Anticipate patient may benefit from PT to address problems listed below. Will continue to follow acutely for trial of PT to maximize functional mobility independence and safety.         Recommendations for follow up therapy are one component of a multi-disciplinary discharge planning process, led by the attending physician.  Recommendations may be updated based on patient status, additional functional criteria and insurance authorization.  Follow Up Recommendations Skilled nursing-short term rehab (<3 hours/day)    Assistance Recommended at Discharge Frequent or constant Supervision/Assistance  Functional Status Assessment  (unclear as baseline status not known at this time)  Equipment Recommendations  None recommended by PT    Recommendations for Other Services       Precautions / Restrictions Precautions Precautions: Fall Required Braces or Orthoses: Other Brace Other Brace: still wearing limb guard for L BKA done in Aug 2022      Mobility  Bed Mobility Overal bed  mobility: Needs Assistance             General bed mobility comments: refused to sit EOB; assisted to scoot to [facility] with total assist with bed in trendelenburg; placed in chair-like position for lung clearance    Transfers                        Ambulation/Gait                Stairs            Wheelchair Mobility    Modified Rankin (Stroke Patients Only)       Balance                                             Pertinent Vitals/Pain Pain Assessment: Faces Faces Pain Scale: Hurts a little bit Pain Location: reports all over Pain Descriptors / Indicators: Aching Pain Intervention(s): Limited activity within patient's tolerance;Patient requesting pain meds-RN notified    Home Living Family/patient expects to be discharged to:: Skilled nursing facility                   Additional Comments: Pt from [facility]; ?still working with therapy?    Prior Function Prior Level of Function : Patient poor historian/Family not available             Mobility Comments: pt would not answer questions related to functional status at SNF (or even confirm if he was working with therapies)       Hand Dominance  Extremity/Trunk Assessment   Upper Extremity Assessment Upper Extremity Assessment: Defer to OT evaluation    Lower Extremity Assessment Lower Extremity Assessment: RLE deficits/detail;LLE deficits/detail RLE Deficits / Details: hip flexion at least 3/5; AROM hip and knee WFL (would not complete ankle ROM) LLE Deficits / Details: s/p L BKA in 8/22; wearing limb guard brace; removed to assess skin with no issues noted--end of stump wrapped with dressing/kerlix       Communication   Communication: No difficulties (states his name, "yes" to wanting pain medication, "ow, ow, ow...")  Cognition Arousal/Alertness: Awake/alert Behavior During Therapy: Flat affect Overall Cognitive Status: Difficult to assess                                  General Comments: pt refusing to answer questions; RN reports he earlier told her she asks too many questions        General Comments General comments (skin integrity, edema, etc.): RN reports pt has spoken very little to her as well.    Exercises     Assessment/Plan    PT Assessment Patient needs continued PT services  PT Problem List Decreased strength;Decreased activity tolerance;Decreased balance;Decreased mobility;Decreased cognition;Decreased knowledge of use of DME;Pain       PT Treatment Interventions DME instruction;Functional mobility training;Therapeutic activities;Therapeutic exercise;Balance training;Cognitive remediation;Patient/family education;Wheelchair mobility training    PT Goals (Current goals can be found in the Care Plan section)  Acute Rehab PT Goals Patient Stated Goal: will not state PT Goal Formulation: Patient unable to participate in goal setting Time For Goal Achievement: 08/27/21 Potential to Achieve Goals: Fair    Frequency Min 2X/week   Barriers to discharge        Co-evaluation               AM-PAC PT "6 Clicks" Mobility  Outcome Measure Help needed turning from your back to your side while in a flat bed without using bedrails?: Total Help needed moving from lying on your back to sitting on the side of a flat bed without using bedrails?: Total Help needed moving to and from a bed to a chair (including a wheelchair)?: Total Help needed standing up from a chair using your arms (e.g., wheelchair or bedside chair)?: Total Help needed to walk in hospital room?: Total Help needed climbing 3-5 steps with a railing? : Total 6 Click Score: 6    End of Session   Activity Tolerance: Patient limited by pain Patient left: in bed;with call bell/phone within reach;with bed alarm set Nurse Communication: Mobility status;Need for lift equipment;Patient requests pain meds PT Visit Diagnosis: Muscle  weakness (generalized) (M62.81);Difficulty in walking, not elsewhere classified (R26.2);Pain Pain - Right/Left:  (bil) Pain - part of body:  ("all over")    Time: 1962-2297 PT Time Calculation (min) (ACUTE ONLY): 19 min   Charges:   PT Evaluation $PT Eval Low Complexity: 1 Low           Arby Barrette, PT Acute Rehabilitation Services  Pager [phone] Office [phone]   Rexanne Mano 08/13/2021, 9:00 AM

## 2021-08-13 NOTE — Discharge Instructions (Signed)
Information on my medicine - ELIQUIS (apixaban)  This medication education was reviewed with me or my healthcare representative as part of my discharge preparation.    Why was Eliquis prescribed for you? Eliquis was prescribed to treat blood clots that may have been found in the veins of your legs (deep vein thrombosis) or in your lungs (pulmonary embolism) and to reduce the risk of them occurring again.  What do You need to know about Eliquis ? The starting dose is 10 mg (two 5 mg tablets) taken TWICE daily for the FIRST SEVEN (7) DAYS, then on Saturday Nov 5th (08/19/21)  the dose is reduced to ONE 5 mg tablet taken TWICE daily.  Eliquis may be taken with or without food.   Try to take the dose about the same time in the morning and in the evening. If you have difficulty swallowing the tablet whole please discuss with your pharmacist how to take the medication safely.  Take Eliquis exactly as prescribed and DO NOT stop taking Eliquis without talking to the doctor who prescribed the medication.  Stopping may increase your risk of developing a new blood clot.  Refill your prescription before you run out.  After discharge, you should have regular check-up appointments with your healthcare provider that is prescribing your Eliquis.    What do you do if you miss a dose? If a dose of ELIQUIS is not taken at the scheduled time, take it as soon as possible on the same day and twice-daily administration should be resumed. The dose should not be doubled to make up for a missed dose.  Important Safety Information A possible side effect of Eliquis is bleeding. You should call your healthcare provider right away if you experience any of the following: Bleeding from an injury or your nose that does not stop. Unusual colored urine (red or dark brown) or unusual colored stools (red or black). Unusual bruising for unknown reasons. A serious fall or if you hit your head (even if there is no  bleeding).  Some medicines may interact with Eliquis and might increase your risk of bleeding or clotting while on Eliquis. To help avoid this, consult your healthcare provider or pharmacist prior to using any new prescription or non-prescription medications, including herbals, vitamins, non-steroidal anti-inflammatory drugs (NSAIDs) and supplements.  This website has more information on Eliquis (apixaban): http://www.eliquis.com/eliquis/home

## 2021-08-13 NOTE — Progress Notes (Addendum)
Subjective: Minimal voice response to questions.  Keeps repeating "oh  me", denies CP or SOB.  Noted febrile this a.m. to 101.9, repeat chest x-ray pending  Objective Vital signs in last 24 hours: Vitals:   08/12/21 2257 08/13/21 0639 08/13/21 1059 08/13/21 1256  BP: 110/76 137/76 (!) 133/115 (!) 188/99  Pulse: 85 88  (!) 104  Resp:  14 19   Temp:  98.6 F (37 C) (!) 101.9 F (38.8 C) (!) 101.9 F (38.8 C)  TempSrc:   Oral   SpO2:  99% 99% 100%  Weight:      Height:       Weight change: -7.896 kg  Physical Exam: General: Alert thin chronically ill male NAD Heart: Tacky regular no MR G appreciated Lungs: CTA anteriorly nonlabored breathing Abdomen: NABS, soft NTND Extremities: No pedal edema, status post left BKA Dialysis Access: RFA AV GG positive bruit  Dialysis Orders: Center: [facility], MWF, 3 hours 15 minutes Bath 2K 2 CA EDW 56 kg Heparin 1700 Mircera 150 MCG every 2 weeks last given 1019/22 R FA  AV GG     Problem/Plan: Acute metabolic encephalopathy work-up per admit = probable  COVID-19 infection etiology, ESRD -HD MWF, continue schedule next dialysis Monday-with COVID isolation, no acute dialysis needs today Mild hypokalemia= K2.9 > 4.4 this a.m. treated with p.o. replacement COVID-19 positive= on remdesivir per admit, no hypoxemia or pneumonia, repeat chest x-ray pending HD in isolation Hypertension/volume  -no excess volume on exam or chest x-ray BP stable, on amlodipine milligrams daily Anemia of ESRD-Hgb 11.7> 14.7, no current ESA needs , follow-up trend Hgb Metabolic bone disease -Phos 3.7 continue calcium acetate as binder no current vitamin D listed calcium okay Nutrition -albumin 2.5 > 3.5 ,Nepro supplement, renal diet PAD status post left BKA= admit continue stump care PT OT, he is from SNF   Ernest Haber, PA-C [place] [phone] 08/13/2021,1:16 PM  LOS: 1 day   Labs: Basic Metabolic Panel: Recent Labs  Lab 08/11/21 1802  08/12/21 0853 08/13/21 0241  NA 138 138 136  K 3.3* 2.9* 4.4  CL 94* 101 97*  CO2 29 26 24   GLUCOSE 140* 87 153*  BUN 14 14 29*  CREATININE 3.76* 3.98* 5.60*  CALCIUM 8.7* 7.0* 8.9  PHOS  --  3.1 3.7   Liver Function Tests: Recent Labs  Lab 08/11/21 1802 08/12/21 0853 08/13/21 0241  AST 26  --   --   ALT 19  --   --   ALKPHOS 100  --   --   BILITOT 0.8  --   --   PROT 8.5*  --   --   ALBUMIN 3.6 2.5* 3.2*   No results for input(s): LIPASE, AMYLASE in the last 168 hours. Recent Labs  Lab 08/12/21 0303  AMMONIA 24   CBC: Recent Labs  Lab 08/11/21 1802 08/12/21 0853 08/13/21 0241  WBC 5.2 3.7* 4.8  NEUTROABS 3.8  --   --   HGB 14.4 11.7* 14.3  HCT 43.1 37.5* 43.3  MCV 98.4 101.1* 96.9  PLT 141* 119* 155   Cardiac Enzymes: No results for input(s): CKTOTAL, CKMB, CKMBINDEX, TROPONINI in the last 168 hours. CBG: Recent Labs  Lab 08/12/21 1255 08/12/21 1614 08/12/21 2107 08/13/21 0843 08/13/21 1257  GLUCAP 137* 164* 103* 156* 146*     Medications:  remdesivir 100 mg in NS 100 mL 100 mg (08/13/21 1110)    amLODipine  5 mg Oral Daily  apixaban  10 mg Oral BID   Followed by   Derrill Memo ON 08/19/2021] apixaban  5 mg Oral BID   aspirin  81 mg Oral Daily   feeding supplement (NEPRO CARB STEADY)  237 mL Oral BID BM   insulin aspart  0-5 Units Subcutaneous QHS   insulin aspart  0-6 Units Subcutaneous TID WC   polyethylene glycol  17 g Oral BID   senna  1 tablet Oral Daily    I have seen and examined this patient and agree with plan and assessment in the above note with renal recommendations/intervention highlighted.   Governor Rooks Shaneisha Burkel,MD 08/13/2021 2:39 PM

## 2021-08-13 NOTE — Evaluation (Signed)
Occupational Therapy Evaluation Patient Details Name: Roger Vazquez MRN: [id number] DOB: [birth year] Today's Date: 08/13/2021   History of Present Illness 70 y.o. male presented to the ED 08/12/21 from his dialysis center for evaluation of fever and confusion. +COVID, head CT negative, +DVT LLE   PMH significant of ESRD on HD MWF, hypertension, non-insulin-dependent type 2 diabetes, history of lung cancer, PAD, polysubstance abuse, left BKA., Hep C   Clinical Impression   Pt admitted with concerns listed above. PTA pt was at [facility] for Rehab, unsure what his level of functioning was. Pt presenting with cognitive deficits, not verbally responding to any questions or statements, as well as following limited simple commands. Pt requiring mod-max A for all ADL's and potentially +2 for an transfers. Session was limited to bed mobility due to pt's cognition and complaints of pain. OT will continue to follow acutely to address concerns listed below.      Recommendations for follow up therapy are one component of a multi-disciplinary discharge planning process, led by the attending physician.  Recommendations may be updated based on patient status, additional functional criteria and insurance authorization.   Follow Up Recommendations  Skilled nursing-short term rehab (<3 hours/day)    Assistance Recommended at Discharge Frequent or constant Supervision/Assistance  Functional Status Assessment  Patient has had a recent decline in their functional status and demonstrates the ability to make significant improvements in function in a reasonable and predictable amount of time.  Equipment Recommendations  None recommended by OT    Recommendations for Other Services       Precautions / Restrictions Precautions Precautions: Fall Required Braces or Orthoses: Other Brace Other Brace: still wearing limb guard for L BKA done in Aug 2022      Mobility Bed Mobility Overal bed mobility: Needs  Assistance Bed Mobility: Supine to Sit;Sit to Supine     Supine to sit: Max assist Sit to supine: Max assist   General bed mobility comments: Max A to elevate trunk and scoot to EOB, as well as to bring BLE back to bed and scoot to return to supine.    Transfers                   General transfer comment: Deferred      Balance Overall balance assessment: Needs assistance Sitting-balance support: Bilateral upper extremity supported;Feet supported Sitting balance-Leahy Scale: Fair Sitting balance - Comments: Initially required mod A for support, able to steady himself and use min g for safety                                   ADL either performed or assessed with clinical judgement   ADL Overall ADL's : Needs assistance/impaired                                       General ADL Comments: Unsure what his baseline is at this time. Pt requiring mod-max A for all ADL's, due to cognition and limited mobility/pain. Remained bed level, most likely will need +2 for all transfers and mobility     Vision Baseline Vision/History: 1 Wears glasses Ability to See in Adequate Light: 0 Adequate Patient Visual Report: No change from baseline Vision Assessment?: No apparent visual deficits     Perception     Praxis  Pertinent Vitals/Pain Pain Assessment: Faces Faces Pain Scale: Hurts little more Pain Location: Generalized with movement Pain Descriptors / Indicators: Grimacing;Guarding;Moaning Pain Intervention(s): Limited activity within patient's tolerance;Monitored during session;Repositioned     Hand Dominance     Extremity/Trunk Assessment Upper Extremity Assessment Upper Extremity Assessment: Generalized weakness   Lower Extremity Assessment Lower Extremity Assessment: Defer to PT evaluation   Cervical / Trunk Assessment Cervical / Trunk Assessment: Kyphotic   Communication Communication Communication:  (Pt only saying "ow, ow,  ow" the entire session, not responding to any question or statement)   Cognition Arousal/Alertness: Awake/alert Behavior During Therapy: Flat affect Overall Cognitive Status: Difficult to assess                                 General Comments: pt refusing to answer questions; RN reports he earlier told her she asks too many questions     General Comments  Pt's skin dry, OT applied lotion to BLE.    Exercises     Shoulder Instructions      Home Living Family/patient expects to be discharged to:: Skilled nursing facility                                 Additional Comments: Pt from [facility] per chart      Prior Functioning/Environment Prior Level of Function : Patient poor historian/Family not available             Mobility Comments: Pt not answering any questions          OT Problem List: Decreased strength;Decreased activity tolerance;Impaired balance (sitting and/or standing);Decreased coordination;Decreased cognition;Decreased safety awareness;Decreased knowledge of use of DME or AE;Pain      OT Treatment/Interventions: Self-care/ADL training;Therapeutic exercise;Energy conservation;DME and/or AE instruction;Therapeutic activities;Cognitive remediation/compensation;Patient/family education;Balance training    OT Goals(Current goals can be found in the care plan section) Acute Rehab OT Goals Patient Stated Goal: None stated OT Goal Formulation: Patient unable to participate in goal setting Time For Goal Achievement: 08/27/21 Potential to Achieve Goals: Fair ADL Goals Pt Will Perform Grooming: with set-up;sitting Pt Will Perform Upper Body Bathing: with min assist;sitting Pt Will Perform Lower Body Bathing: with mod assist;sitting/lateral leans;sit to/from stand;with adaptive equipment Pt Will Perform Upper Body Dressing: with min assist;sitting Pt Will Perform Lower Body Dressing: with mod assist;sitting/lateral leans;sit to/from  stand Pt Will Transfer to Toilet: with mod assist;stand pivot transfer Pt Will Perform Toileting - Clothing Manipulation and hygiene: with mod assist;sitting/lateral leans;sit to/from stand;with adaptive equipment  OT Frequency: Min 2X/week   Barriers to D/C:            Co-evaluation              AM-PAC OT "6 Clicks" Daily Activity     Outcome Measure Help from another person eating meals?: A Little Help from another person taking care of personal grooming?: A Lot Help from another person toileting, which includes using toliet, bedpan, or urinal?: Total Help from another person bathing (including washing, rinsing, drying)?: A Lot Help from another person to put on and taking off regular upper body clothing?: A Lot Help from another person to put on and taking off regular lower body clothing?: Total 6 Click Score: 11   End of Session Nurse Communication: Mobility status  Activity Tolerance: Patient limited by pain Patient left: in bed;with call bell/phone within reach;with bed  alarm set  OT Visit Diagnosis: Unsteadiness on feet (R26.81);Other abnormalities of gait and mobility (R26.89);Muscle weakness (generalized) (M62.81)                Time: 5894-8347 OT Time Calculation (min): 20 min Charges:  OT General Charges $OT Visit: 1 Visit OT Evaluation $OT Eval Moderate Complexity: 1 Mod  Kilea Mccarey H., OTR/L Acute Rehabilitation  Jedidiah Demartini Elane Yolanda Bonine 08/13/2021, 3:31 PM

## 2021-08-13 NOTE — TOC Initial Note (Signed)
Transition of Care [facility]) - Initial/Assessment Note    Patient Details  Name: Roger Vazquez MRN: [id number] Date of Birth: 01-Mar-1951  Transition of Care [facility]) CM/SW Contact:    Coralee Pesa, [place] Phone Number: 08/13/2021, 12:14 PM  Clinical Narrative:                 CSW noted pt is disoriented at this time and spoke with sister regarding discharge planning. Sister states pt is from [facility]. He had been there for short term rehab, but now they are saying he needs LTC. They have begun his Medicaid application and it is pending. She states she does not want that to be his long term facility, but is aware that she can work on switching after the Medicaid is approved. For now she is agreeable with pt returning to [facility]. TOC will need to follow up with facility to confirm details. CSW will complete FL2, TOC will continue to follow.  Expected Discharge Plan: Skilled Nursing Facility Barriers to Discharge: Continued Medical Work up, Ship broker   Patient Goals and CMS Choice Patient states their goals for this hospitalization and ongoing recovery are:: Pt unable to participate in goal setting due to disorientation. CMS Medicare.gov Compare Post Acute Care list provided to:: Patient Represenative (must comment) Choice offered to / list presented to : Sibling  Expected Discharge Plan and Services Expected Discharge Plan: [place] Choice: [place] Living arrangements for the past 2 months: [place]                                      Prior Living Arrangements/Services Living arrangements for the past 2 months: [place] Lives with:: Facility Resident Patient language and need for interpreter reviewed:: Yes Do you feel safe going back to the place where you live?: Yes      Need for Family Participation in Patient Care: Yes (Comment) Care giver support system in place?:  Yes (comment)   Criminal Activity/Legal Involvement Pertinent to Current Situation/Hospitalization: No - Comment as needed  Activities of Daily Living Home Assistive Devices/Equipment: Wheelchair, Environmental consultant (specify type) ADL Screening (condition at time of admission) Patient's cognitive ability adequate to safely complete daily activities?: No Is the patient deaf or have difficulty hearing?: Yes (right ear) Does the patient have difficulty seeing, even when wearing glasses/contacts?: Yes Does the patient have difficulty concentrating, remembering, or making decisions?: Yes Patient able to express need for assistance with ADLs?: Yes Does the patient have difficulty dressing or bathing?: Yes Independently performs ADLs?: No Communication: Independent Dressing (OT): Dependent Is this a change from baseline?: Change from baseline, expected to last >3 days Grooming: Dependent Is this a change from baseline?: Change from baseline, expected to last >3 days Feeding: Needs assistance Is this a change from baseline?: Change from baseline, expected to last >3 days Bathing: Dependent Is this a change from baseline?: Change from baseline, expected to last >3 days Toileting: Dependent Is this a change from baseline?: Change from baseline, expected to last >3days In/Out Bed: Dependent Is this a change from baseline?: Change from baseline, expected to last >3 days Walks in Home: Dependent Is this a change from baseline?: Change from baseline, expected to last >3 days Does the patient have difficulty walking or climbing stairs?: Yes Weakness of Legs: Right (have a left BKA) Weakness of Arms/Hands:  Both  Permission Sought/Granted Permission sought to share information with : Family Supports, Chartered certified accountant granted to share information with : Yes, Verbal Permission Granted  Share Information with NAME: [name]  Permission granted to share info w AGENCY:  [facility]  Permission granted to share info w Relationship: Sister  Permission granted to share info w Contact Information: [phone]  Emotional Assessment Appearance:: Appears stated age Attitude/Demeanor/Rapport: Unable to Assess Affect (typically observed): Unable to Assess Orientation: : Oriented to Self, Oriented to Place Alcohol / Substance Use: Not Applicable Psych Involvement: No (comment)  Admission diagnosis:  End stage renal disease on dialysis ([place]) [N18.6, Z99.2] Fever, unspecified fever cause [R50.9] COVID-19 virus infection [U07.1] COVID-19 [U07.1] Patient Active Problem List   Diagnosis Date Noted   COVID-19 54/27/0623   Acute metabolic encephalopathy 76/28/3151   COVID-19 virus infection 08/12/2021   Peripheral vascular disease ([place]) 06/09/2021   S/P BKA (below knee amputation), left ([place]) 06/08/2021   Status post amputation of foot through metatarsal bone ([place])    Altered mental status 05/19/2021   Left foot infection    Alcohol abuse 05/16/2021   Erectile dysfunction 05/16/2021   Hydronephrosis 05/16/2021   Acute hepatitis C 05/16/2021   Diabetic retinopathy ([place]) 05/16/2021   History of hemodialysis 05/16/2021   Iron deficiency anemia 05/16/2021   Malignant neoplasm of upper lobe, left bronchus or lung ([place]) 05/16/2021   Noncompliance with medication regimen 05/16/2021   Non-small cell lung cancer ([place]) 05/16/2021   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   Vitreous hemorrhage, left eye ([place]) 05/16/2021   Cellulitis of left lower extremity    Gangrene ([place]) 04/27/2021   Diarrhea, unspecified 04/29/2019   Mild protein-calorie malnutrition ([place]) 08/20/2018   Fever    Infection of AV graft for dialysis ([place]) 08/05/2018   Symptomatic anemia 08/05/2018   Hypokalemia 08/05/2018   GERD (gastroesophageal reflux disease) 08/05/2018   Sepsis due to unspecified Staphylococcus ([place]) 08/02/2018   ESRD on hemodialysis ([place])    Bacteremia due to  methicillin susceptible Staphylococcus aureus (MSSA) 07/28/2018   Sepsis ([place]) 07/27/2018   Headache 01/14/2017   Fluid overload, unspecified 12/13/2015   Anemia in chronic kidney disease 12/09/2015   Chronic kidney disease, stage 5 ([place]) 12/09/2015   Coagulation defect, unspecified ([place]) 12/09/2015   Encounter for adjustment and management of vascular access device 12/09/2015   Other secondary pulmonary hypertension ([place]) 12/09/2015   Pruritus, unspecified 12/09/2015   Secondary hyperparathyroidism of renal origin ([place]) 12/09/2015   Shortness of breath 12/09/2015   Abdominal pain, other specified site 10/07/2011   Nausea and vomiting 10/07/2011   VITAMIN D DEFICIENCY 03/19/2008   FRACTURE, TIBIA 02/09/2008   HTN (hypertension) 11/25/2007   HEPATITIS C 05/27/2007   Type 2 diabetes mellitus with chronic kidney disease on chronic dialysis ([place]) 05/27/2007   ERECTILE DYSFUNCTION 05/27/2007   ABUSE, ALCOHOL, IN REMISSION 05/27/2007   ABUSE, OPIOID, IN REMISSION 05/27/2007   ABUSE, COCAINE, IN REMISSION 05/27/2007   PCP:  Clinic, [place]:   [facility] [phone] - [place], [place] [address] [address] Phone: [phone] Fax: [phone]     Social Determinants of Health (SDOH) Interventions    Readmission Risk Interventions Readmission Risk Prevention Plan 05/30/2021  Transportation Screening Complete  SW Recovery Care/Counseling Consult Complete  Skilled Nursing Facility Complete  Some recent data might be hidden

## 2021-08-13 NOTE — Progress Notes (Addendum)
PROGRESS NOTE        PATIENT DETAILS Name: Roger Vazquez Age: 70 y.o. Sex: male Date of Birth: [birth year] Admit Date: 08/11/2021 Admitting Physician Evalee Mutton Kristeen Mans, MD EPP:IRJJOA, [place]  Brief Narrative: Patient is a 70 y.o. male with history of ESRD on HD MWF, PAD-s/p left BKA, DM-2, HTN-who presented from his hemodialysis center for evaluation of altered mental status, he was found to have COVID-19 infection.  Subjective: Febrile this morning but no other major issues overnight.   Objective: Vitals: Blood pressure (!) 133/115, pulse 88, temperature (!) 101.9 F (38.8 C), temperature source Oral, resp. rate 19, height 5\' 6"  (1.676 m), weight 54.7 kg, SpO2 99 %.   Exam: Gen Exam:Alert awake-not in any distress HEENT:atraumatic, normocephalic Chest: B/L clear to auscultation anteriorly CVS:S1S2 regular Abdomen:soft non tender, non distended Extremities:no edema-s/p left BKA Neurology: Non focal Skin: no rash   Pertinent Labs/Radiology: WBC: 4.8 Hb: 14.3  platelet: 155 K: 4.4  10/28>>Blood culture: No growth.  10/28>> CT head: No acute intracranial abnormality. 10/28>>CXR:No PNA 10/29  Assessment/Plan: Acute metabolic encephalopathy: Due to COVID-19 infection-slightly lethargic due to being febrile this morning but still awake and alert.  CT head negative.  Continue supportive care.    COVID 19 infection: Febrile this morning-no other source of infection apparent-continue Remdesivir-repeat chest x-ray and blood cultures today.  Mild pancytopenia: Likely reflective of COVID-19 infection-improving-follow CBC periodically.    Hypokalemia: Repleted.  ESRD on HD MWF: Nephrology following-defer further to nephrology.  PAD-s/p left BKA: Continue stump care-no dehiscence but there appears to be a small open area-we will consult wound care.  Continue aspirin/Plavix/statin.  Left lower extremity DVT: Continue Eliquis-no longer on  Plavix-(discontinued on 10/29 after discussion with Dr. Hayden Rasmussen surgery)  HTN: BP stable-continue amlodipine.  Debility/deconditioning: Due to acute illness-suspect has some debility at baseline.  PT/OT eval following-recommendations of SNF.  Procedures: None Consults: None DVT Prophylaxis: Eliquis Code Status:Full code  Family Communication: Sister-[phone]-left voicemail on 10/30  Time spent: 35 minutes-Greater than 50% of this time was spent in counseling, explanation of diagnosis, planning of further management, and coordination of care.  Diet: Diet Order             Diet renal with fluid restriction Fluid restriction: 1200 mL Fluid; Room service appropriate? Yes; Fluid consistency: Thin  Diet effective now                      Disposition Plan: Status is: Observation  The patient will require care spanning > 2 midnights and should be moved to inpatient because: Inpatient level of care-COVID-19 infection-on IV Remdesivir-debilitated-needs PT/OT before consideration of discharge.   Antimicrobial agents: Anti-infectives (From admission, onward)    Start     Dose/Rate Route Frequency Ordered Stop   08/13/21 1000  remdesivir 100 mg in sodium chloride 0.9 % 100 mL IVPB  Status:  Discontinued       See Hyperspace for full Linked Orders Report.   100 mg 200 mL/hr over 30 Minutes Intravenous Daily 08/12/21 0228 08/12/21 1038   08/13/21 1000  remdesivir 100 mg in sodium chloride 0.9 % 100 mL IVPB       See Hyperspace for full Linked Orders Report.   100 mg 200 mL/hr over 30 Minutes Intravenous Daily 08/12/21 1038 08/15/21 0959   08/12/21 0400  remdesivir 200  mg in sodium chloride 0.9% 250 mL IVPB       See Hyperspace for full Linked Orders Report.   200 mg 580 mL/hr over 30 Minutes Intravenous Once 08/12/21 0228 08/12/21 0440        MEDICATIONS: Scheduled Meds:  amLODipine  5 mg Oral Daily   apixaban  10 mg Oral BID   Followed by   Derrill Memo ON  08/19/2021] apixaban  5 mg Oral BID   aspirin  81 mg Oral Daily   feeding supplement (NEPRO CARB STEADY)  237 mL Oral BID BM   insulin aspart  0-5 Units Subcutaneous QHS   insulin aspart  0-6 Units Subcutaneous TID WC   polyethylene glycol  17 g Oral BID   senna  1 tablet Oral Daily   Continuous Infusions:  remdesivir 100 mg in NS 100 mL 100 mg (08/13/21 1110)   PRN Meds:.acetaminophen **OR** acetaminophen, guaiFENesin-dextromethorphan, hydrALAZINE, oxyCODONE-acetaminophen   I have personally reviewed following labs and imaging studies  LABORATORY DATA: CBC: Recent Labs  Lab 08/11/21 1802 08/12/21 0853 08/13/21 0241  WBC 5.2 3.7* 4.8  NEUTROABS 3.8  --   --   HGB 14.4 11.7* 14.3  HCT 43.1 37.5* 43.3  MCV 98.4 101.1* 96.9  PLT 141* 119* 155     Basic Metabolic Panel: Recent Labs  Lab 08/11/21 1802 08/12/21 0303 08/12/21 0853 08/13/21 0241  NA 138  --  138 136  K 3.3*  --  2.9* 4.4  CL 94*  --  101 97*  CO2 29  --  26 24  GLUCOSE 140*  --  87 153*  BUN 14  --  14 29*  CREATININE 3.76*  --  3.98* 5.60*  CALCIUM 8.7*  --  7.0* 8.9  MG  --  2.4  --   --   PHOS  --   --  3.1 3.7     GFR: Estimated Creatinine Clearance: 9.4 mL/min (A) (by C-G formula based on SCr of 5.6 mg/dL (H)).  Liver Function Tests: Recent Labs  Lab 08/11/21 1802 08/12/21 0853 08/13/21 0241  AST 26  --   --   ALT 19  --   --   ALKPHOS 100  --   --   BILITOT 0.8  --   --   PROT 8.5*  --   --   ALBUMIN 3.6 2.5* 3.2*    No results for input(s): LIPASE, AMYLASE in the last 168 hours. Recent Labs  Lab 08/12/21 0303  AMMONIA 24     Coagulation Profile: No results for input(s): INR, PROTIME in the last 168 hours.  Cardiac Enzymes: No results for input(s): CKTOTAL, CKMB, CKMBINDEX, TROPONINI in the last 168 hours.  BNP (last 3 results) No results for input(s): PROBNP in the last 8760 hours.  Lipid Profile: Recent Labs    08/12/21 0111  TRIG 80     Thyroid Function  Tests: Recent Labs    08/12/21 0303  TSH 2.598     Anemia Panel: Recent Labs    08/12/21 0111 08/12/21 0303  VITAMINB12  --  585  FERRITIN 971*  --      Urine analysis:    Component Value Date/Time   COLORURINE YELLOW 04/27/2021 2056   APPEARANCEUR CLOUDY (A) 04/27/2021 2056   LABSPEC 1.012 04/27/2021 2056   PHURINE 8.0 04/27/2021 2056   GLUCOSEU 50 (A) 04/27/2021 2056   HGBUR NEGATIVE 04/27/2021 2056   [place] NEGATIVE 04/27/2021 2056   [place] NEGATIVE 04/27/2021 2056  PROTEINUR 100 (A) 04/27/2021 2056   UROBILINOGEN 0.2 10/07/2011 1618   NITRITE NEGATIVE 04/27/2021 2056   LEUKOCYTESUR LARGE (A) 04/27/2021 2056    Sepsis Labs: Lactic Acid, Venous    Component Value Date/Time   LATICACIDVEN 1.2 08/12/2021 0111    MICROBIOLOGY: Recent Results (from the past 240 hour(s))  Resp Panel by RT-PCR (Flu A&B, Covid) Nasopharyngeal Swab     Status: Abnormal   Collection Time: 08/11/21  6:02 PM   Specimen: Nasopharyngeal Swab; Nasopharyngeal(NP) swabs in vial transport medium  Result Value Ref Range Status   SARS Coronavirus 2 by RT PCR POSITIVE (A) NEGATIVE Final    Comment: RESULT CALLED TO, READ BACK BY AND VERIFIED WITH: OLDLAND,RN@0003  08/12/21 [place] (NOTE) SARS-CoV-2 target nucleic acids are DETECTED.  The SARS-CoV-2 RNA is generally detectable in upper respiratory specimens during the acute phase of infection. Positive results are indicative of the presence of the identified virus, but do not rule out bacterial infection or co-infection with other pathogens not detected by the test. Clinical correlation with patient history and other diagnostic information is necessary to determine patient infection status. The expected result is Negative.  Fact Sheet for Patients: EntrepreneurPulse.com.au  Fact Sheet for Healthcare Providers: IncredibleEmployment.be  This test is not yet approved or cleared by the [place]  FDA and  has been authorized for detection and/or diagnosis of SARS-CoV-2 by FDA under an Emergency Use Authorization (EUA).  This EUA will remain in effect (meaning this test can be used)  for the duration of  the COVID-19 declaration under Section 564(b)(1) of the Act, 21 U.S.C. section 360bbb-3(b)(1), unless the authorization is terminated or revoked sooner.     Influenza A by PCR NEGATIVE NEGATIVE Final   Influenza B by PCR NEGATIVE NEGATIVE Final    Comment: (NOTE) The Xpert Xpress SARS-CoV-2/FLU/RSV plus assay is intended as an aid in the diagnosis of influenza from Nasopharyngeal swab specimens and should not be used as a sole basis for treatment. Nasal washings and aspirates are unacceptable for Xpert Xpress SARS-CoV-2/FLU/RSV testing.  Fact Sheet for Patients: EntrepreneurPulse.com.au  Fact Sheet for Healthcare Providers: IncredibleEmployment.be  This test is not yet approved or cleared by the [place] FDA and has been authorized for detection and/or diagnosis of SARS-CoV-2 by FDA under an Emergency Use Authorization (EUA). This EUA will remain in effect (meaning this test can be used) for the duration of the COVID-19 declaration under Section 564(b)(1) of the Act, 21 U.S.C. section 360bbb-3(b)(1), unless the authorization is terminated or revoked.  Performed at [facility], [place] [address], [place] [address] [place], [place] [address]   Culture, blood (routine x 2)     Status: None (Preliminary result)   Collection Time: 08/11/21  6:15 PM   Specimen: BLOOD LEFT ARM  Result Value Ref Range Status   Specimen Description BLOOD LEFT ARM  Final   Special Requests   Final    BOTTLES DRAWN AEROBIC AND ANAEROBIC Blood Culture adequate volume   Culture   Final    NO GROWTH 2 DAYS Performed at [facility], [place] [address], [place], [place] [address]    Report Status PENDING  Incomplete    RADIOLOGY STUDIES/RESULTS: DG Chest 2  View  Result Date: 08/11/2021 CLINICAL DATA:  Fever.  Disoriented. EXAM: CHEST - 2 VIEW COMPARISON:  One-view chest x-ray 05/27/2021 FINDINGS: Heart is mildly enlarged. Atherosclerotic calcifications present at the aortic arch. Left hemidiaphragm is chronically elevated. The lungs are clear. Axial skeleton is unremarkable. IMPRESSION:  No acute cardiopulmonary disease. Electronically Signed   By: San Morelle M.D.   On: 08/11/2021 19:05   CT HEAD WO CONTRAST (5MM)  Result Date: 08/11/2021 CLINICAL DATA:  Altered level of consciousness EXAM: CT HEAD WITHOUT CONTRAST TECHNIQUE: Contiguous axial images were obtained from the base of the skull through the vertex without intravenous contrast. COMPARISON:  05/19/2021 FINDINGS: Brain: No acute infarct or hemorrhage. Stable chronic small vessel ischemic changes within the periventricular white matter and basal ganglia. Lateral ventricles and midline structures are unremarkable. No acute extra-axial fluid collections. No mass effect. Vascular: Stable atherosclerosis.  No hyperdense vessel. Skull: Normal. Negative for fracture or focal lesion. Sinuses/Orbits: No acute finding. Other: None. IMPRESSION: 1. Stable head CT, no acute intracranial process. Electronically Signed   By: Randa Ngo M.D.   On: 08/11/2021 19:16   VAS [place] LOWER EXTREMITY VENOUS (DVT)  Result Date: 08/13/2021  Lower Venous DVT Study Patient Name:  Roger Vazquez  Date of Exam:   08/12/2021 Medical Rec #: [id number]         Accession #:    [id number] Date of Birth: Jan 22, 1951         Patient Gender: M Patient Age:   36 years Exam [facility]:  [facility] Procedure:      VAS [place] LOWER EXTREMITY VENOUS (DVT) Referring Phys: Wandra Feinstein RATHORE --------------------------------------------------------------------------------  Indications: Covid, elevated D-Dimer.  Risk Factors: Left BKA 06/08/21. Limitations: Orthopaedic appliance. Comparison Study: Prior negative left LEV done 04/30/21  Performing Technologist: Sharion Dove RVS  Examination Guidelines: A complete evaluation includes B-mode imaging, spectral Doppler, color Doppler, and power Doppler as needed of all accessible portions of each vessel. Bilateral testing is considered an integral part of a complete examination. Limited examinations for reoccurring indications may be performed as noted. The reflux portion of the exam is performed with the patient in reverse Trendelenburg.  +---------+---------------+---------+-----------+----------+--------------+ RIGHT    CompressibilityPhasicitySpontaneityPropertiesThrombus Aging +---------+---------------+---------+-----------+----------+--------------+ CFV      Full           Yes      Yes                                 +---------+---------------+---------+-----------+----------+--------------+ SFJ      Full                                                        +---------+---------------+---------+-----------+----------+--------------+ FV Prox  Full                                                        +---------+---------------+---------+-----------+----------+--------------+ FV Mid   Full                                                        +---------+---------------+---------+-----------+----------+--------------+ FV DistalFull                                                        +---------+---------------+---------+-----------+----------+--------------+  PFV      Full                                                        +---------+---------------+---------+-----------+----------+--------------+ POP      Full           Yes      Yes                                 +---------+---------------+---------+-----------+----------+--------------+ PTV      Full                                                        +---------+---------------+---------+-----------+----------+--------------+ PERO     Full                                                         +---------+---------------+---------+-----------+----------+--------------+   +---------+---------------+---------+-----------+----------+-------------------+ LEFT     CompressibilityPhasicitySpontaneityPropertiesThrombus Aging      +---------+---------------+---------+-----------+----------+-------------------+ CFV      Partial        Yes      Yes                  Acute               +---------+---------------+---------+-----------+----------+-------------------+ SFJ      Full                                                             +---------+---------------+---------+-----------+----------+-------------------+ FV Prox  Full                                                             +---------+---------------+---------+-----------+----------+-------------------+ FV Mid   Partial                                                          +---------+---------------+---------+-----------+----------+-------------------+ FV DistalPartial                                                          +---------+---------------+---------+-----------+----------+-------------------+ PFV      Full                                                             +---------+---------------+---------+-----------+----------+-------------------+  POP                                                   Not well visualized +---------+---------------+---------+-----------+----------+-------------------+     Summary: RIGHT: - There is no evidence of deep vein thrombosis in the lower extremity.  - Ultrasound characteristics of enlarged lymph nodes are noted in the groin.  LEFT: - Findings consistent with acute, non occlusive deep vein thrombosis involving the left common femoral vein, and left femoral vein.  *See table(s) above for measurements and observations. Electronically signed by Jamelle Haring on 08/13/2021 at 9:28:31 AM.    Final      LOS: 1 day    Oren Binet, MD  [facility]    To contact the attending provider between 7A-7P or the covering provider during after hours 7P-7A, please log into the web site [email or web address] and access using universal [place] password for that web site. If you do not have the password, please call the hospital operator.  08/13/2021, 11:19 AM

## 2021-08-13 NOTE — NC FL2 (Signed)
[place] LEVEL OF CARE SCREENING TOOL     IDENTIFICATION  Patient Name: Roger Vazquez Birthdate: 1951-08-16 Sex: male Admission Date (Current Location): 08/11/2021  [facility] and [place] Number:  Herbalist and Address:  [place], [place] [address] [place], [place] [address]      Provider Number: [id number]  Attending Physician Name and Address:  Jonetta Osgood, MD  Relative Name and Phone Number:  Montrell Cessna [phone]    Current Level of Care: Hospital Recommended Level of Care: [place] Prior Approval Number:    Date Approved/Denied:   PASRR Number: [id number]  Discharge Plan: SNF    Current Diagnoses: Patient Active Problem List   Diagnosis Date Noted   COVID-19 54/62/7035   Acute metabolic encephalopathy 00/93/8182   COVID-19 virus infection 08/12/2021   Peripheral vascular disease ([place]) 06/09/2021   S/P BKA (below knee amputation), left ([place]) 06/08/2021   Status post amputation of foot through metatarsal bone ([place])    Altered mental status 05/19/2021   Left foot infection    Alcohol abuse 05/16/2021   Erectile dysfunction 05/16/2021   Hydronephrosis 05/16/2021   Acute hepatitis C 05/16/2021   Diabetic retinopathy ([place]) 05/16/2021   History of hemodialysis 05/16/2021   Iron deficiency anemia 05/16/2021   Malignant neoplasm of upper lobe, left bronchus or lung ([place]) 05/16/2021   Noncompliance with medication regimen 05/16/2021   Non-small cell lung cancer ([place]) 05/16/2021   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   Vitreous hemorrhage, left eye ([place]) 05/16/2021   Cellulitis of left lower extremity    Gangrene ([place]) 04/27/2021   Diarrhea, unspecified 04/29/2019   Mild protein-calorie malnutrition ([place]) 08/20/2018   Fever    Infection of AV graft for dialysis ([place]) 08/05/2018   Symptomatic anemia 08/05/2018   Hypokalemia 08/05/2018   GERD (gastroesophageal reflux disease)  08/05/2018   Sepsis due to unspecified Staphylococcus ([place]) 08/02/2018   ESRD on hemodialysis ([place])    Bacteremia due to methicillin susceptible Staphylococcus aureus (MSSA) 07/28/2018   Sepsis ([place]) 07/27/2018   Headache 01/14/2017   Fluid overload, unspecified 12/13/2015   Anemia in chronic kidney disease 12/09/2015   Chronic kidney disease, stage 5 ([place]) 12/09/2015   Coagulation defect, unspecified ([place]) 12/09/2015   Encounter for adjustment and management of vascular access device 12/09/2015   Other secondary pulmonary hypertension ([place]) 12/09/2015   Pruritus, unspecified 12/09/2015   Secondary hyperparathyroidism of renal origin ([place]) 12/09/2015   Shortness of breath 12/09/2015   Abdominal pain, other specified site 10/07/2011   Nausea and vomiting 10/07/2011   VITAMIN D DEFICIENCY 03/19/2008   FRACTURE, TIBIA 02/09/2008   HTN (hypertension) 11/25/2007   HEPATITIS C 05/27/2007   Type 2 diabetes mellitus with chronic kidney disease on chronic dialysis ([place]) 05/27/2007   ERECTILE DYSFUNCTION 05/27/2007   ABUSE, ALCOHOL, IN REMISSION 05/27/2007   ABUSE, OPIOID, IN REMISSION 05/27/2007   ABUSE, COCAINE, IN REMISSION 05/27/2007    [place] RESPIRATION BLADDER Height & Weight     Self, Place  Normal Incontinent Weight: 120 lb 9.5 oz (54.7 kg) Height:  5\' 6"  (167.6 cm)  BEHAVIORAL SYMPTOMS/MOOD NEUROLOGICAL BOWEL NUTRITION STATUS      Continent Diet (See DC summary)  AMBULATORY STATUS COMMUNICATION OF NEEDS Skin   Total Care Verbally Normal                       Personal Care Assistance Level of Assistance  Bathing, Feeding, Dressing Bathing  Assistance: Maximum assistance Feeding assistance: Maximum assistance Dressing Assistance: Maximum assistance     Functional Limitations Info  Sight, Hearing, Speech Sight Info: Adequate Hearing Info: Adequate Speech Info: Adequate    SPECIAL CARE FACTORS FREQUENCY  PT (By licensed PT), OT (By licensed OT)     PT  Frequency: 5x week OT Frequency: 5x week            Contractures Contractures Info: Not present    Additional Factors Info  Code Status, Allergies, Insulin Sliding Scale Code Status Info: Full Allergies Info: Sildenafil   Insulin Sliding Scale Info: Insulin Aspart (Novolog) 0-5 U       Current Medications (08/13/2021):  This is the current hospital active medication list Current Facility-Administered Medications  Medication Dose Route Frequency Provider Last Rate Last Admin   acetaminophen (TYLENOL) tablet 650 mg  650 mg Oral Q6H PRN Shela Leff, MD   650 mg at 08/13/21 1111   Or   acetaminophen (TYLENOL) suppository 650 mg  650 mg Rectal Q6H PRN Shela Leff, MD       amLODipine (NORVASC) tablet 5 mg  5 mg Oral Daily Ghimire, Henreitta Leber, MD   5 mg at 08/13/21 1051   apixaban (ELIQUIS) tablet 10 mg  10 mg Oral BID Priscella Mann, RPH   10 mg at 08/13/21 1051   Followed by   Derrill Memo ON 08/19/2021] apixaban (ELIQUIS) tablet 5 mg  5 mg Oral BID Priscella Mann, RPH       aspirin chewable tablet 81 mg  81 mg Oral Daily Jonetta Osgood, MD   81 mg at 08/13/21 1050   feeding supplement (NEPRO CARB STEADY) liquid 237 mL  237 mL Oral BID BM Zeyfang, David, PA-C       guaiFENesin-dextromethorphan (ROBITUSSIN DM) 100-10 MG/5ML syrup 5 mL  5 mL Oral Q4H PRN Shela Leff, MD       hydrALAZINE (APRESOLINE) injection 10 mg  10 mg Intravenous Q6H PRN Jonetta Osgood, MD   10 mg at 08/12/21 2116   insulin aspart (novoLOG) injection 0-5 Units  0-5 Units Subcutaneous QHS Shela Leff, MD       insulin aspart (novoLOG) injection 0-6 Units  0-6 Units Subcutaneous TID WC Shela Leff, MD   1 Units at 08/13/21 0936   oxyCODONE-acetaminophen (PERCOCET/ROXICET) 5-325 MG per tablet 1-2 tablet  1-2 tablet Oral Q6H PRN Jonetta Osgood, MD   1 tablet at 08/13/21 0936   polyethylene glycol (MIRALAX / GLYCOLAX) packet 17 g  17 g Oral BID Jonetta Osgood, MD   17 g  at 08/13/21 1051   remdesivir 100 mg in sodium chloride 0.9 % 100 mL IVPB  100 mg Intravenous Daily Oren Binet M, MD 200 mL/hr at 08/13/21 1110 100 mg at 08/13/21 1110   senna (SENOKOT) tablet 8.6 mg  1 tablet Oral Daily Jonetta Osgood, MD   8.6 mg at 08/13/21 1050     Discharge Medications: Please see discharge summary for a list of discharge medications.  Relevant Imaging Results:  Relevant Lab Results:   Additional Information [phone] HD Jory Sims, Friday, Both Covid Vaccines  Coralee [place], [place]

## 2021-08-14 DIAGNOSIS — G9341 Metabolic encephalopathy: Secondary | ICD-10-CM | POA: Diagnosis not present

## 2021-08-14 DIAGNOSIS — U071 COVID-19: Secondary | ICD-10-CM | POA: Diagnosis not present

## 2021-08-14 DIAGNOSIS — N186 End stage renal disease: Secondary | ICD-10-CM | POA: Diagnosis not present

## 2021-08-14 DIAGNOSIS — R509 Fever, unspecified: Secondary | ICD-10-CM | POA: Diagnosis not present

## 2021-08-14 LAB — RENAL FUNCTION PANEL
Albumin: 3 g/dL — ABNORMAL LOW (ref 3.5–5.0)
Anion gap: 12 (ref 5–15)
BUN: 44 mg/dL — ABNORMAL HIGH (ref 8–23)
CO2: 26 mmol/L (ref 22–32)
Calcium: 9 mg/dL (ref 8.9–10.3)
Chloride: 98 mmol/L (ref 98–111)
Creatinine, Ser: 6.97 mg/dL — ABNORMAL HIGH (ref 0.61–1.24)
GFR, Estimated: 8 mL/min — ABNORMAL LOW (ref >60)
Glucose, Bld: 107 mg/dL — ABNORMAL HIGH (ref 70–99)
Phosphorus: 4.3 mg/dL (ref 2.5–4.6)
Potassium: 4.3 mmol/L (ref 3.5–5.1)
Sodium: 136 mmol/L (ref 135–145)

## 2021-08-14 LAB — HEPATITIS B CORE ANTIBODY, TOTAL: Hep B Core Total Ab: REACTIVE — AB

## 2021-08-14 LAB — GLUCOSE, CAPILLARY
Glucose-Capillary: 141 mg/dL — ABNORMAL HIGH (ref 70–99)
Glucose-Capillary: 186 mg/dL — ABNORMAL HIGH (ref 70–99)
Glucose-Capillary: 159 mg/dL — ABNORMAL HIGH (ref 70–99)
Glucose-Capillary: 89 mg/dL (ref 70–99)

## 2021-08-14 LAB — CBC
HCT: 40.1 % (ref 39.0–52.0)
Hemoglobin: 13.4 g/dL (ref 13.0–17.0)
MCH: 31.5 pg (ref 26.0–34.0)
MCHC: 33.4 g/dL (ref 30.0–36.0)
MCV: 94.4 fL (ref 80.0–100.0)
Platelets: 127 10*3/uL — ABNORMAL LOW (ref 150–400)
RBC: 4.25 MIL/uL (ref 4.22–5.81)
RDW: 16.2 % — ABNORMAL HIGH (ref 11.5–15.5)
WBC: 5.2 10*3/uL (ref 4.0–10.5)
nRBC: 0 % (ref 0.0–0.2)

## 2021-08-14 MED ORDER — AMLODIPINE BESYLATE 10 MG PO TABS
10.0000 mg | ORAL_TABLET | Freq: Every day | ORAL | Status: DC
  Administered 2021-08-15 – 2021-08-16 (×2): 10 mg via ORAL
  Filled 2021-08-14 (×2): qty 1

## 2021-08-14 NOTE — TOC Progression Note (Signed)
Transition of Care [facility]) - Progression Note    Patient Details  Name: Roger Vazquez MRN: [id number] Date of Birth: May 06, 1951  [facility] CM/SW Contact  Joanne Chars, LCSW Phone Number: 08/14/2021, 1:16 PM  Clinical Narrative:  CSW spoke with Janie at [facility].  Pt will return as LTC patient, no authorization needed.  Family will need to fill out new paperwork.       Expected Discharge Plan: [place] Barriers to Discharge: Continued Medical Work up, Ship broker  Expected Discharge Plan and Services Expected Discharge Plan: [place] Choice: [place] arrangements for the past 2 months: [place]                                       Social Determinants of Health (SDOH) Interventions    Readmission Risk Interventions Readmission Risk Prevention Plan 05/30/2021  Transportation Screening Complete  SW Recovery Care/Counseling Consult Complete  Skilled Nursing Facility Complete  Some recent data might be hidden

## 2021-08-14 NOTE — Progress Notes (Signed)
Pt receives out-pt HD at [facility] on MWF. Pt arrives at 11:50 for 12:10 chair time. Pt is covid positive and clinic aware. Renal Navigator to call clinic back tomorrow to see if pt will need a different schedule at d/c due to covid diagnosis. Will follow and assist.    Melven Sartorius Renal Navigator [phone]

## 2021-08-14 NOTE — Progress Notes (Signed)
PROGRESS NOTE        PATIENT DETAILS Name: Roger Vazquez Age: 70 y.o. Sex: male Date of Birth: 09-18-1951 Admit Date: 08/11/2021 Admitting Physician Evalee Mutton Kristeen Mans, MD GYJ:EHUDJS, [place]  Brief Narrative: Patient is a 70 y.o. male with history of ESRD on HD MWF, PAD-s/p left BKA, DM-2, HTN-who presented from his hemodialysis center for evaluation of altered mental status, he was found to have COVID-19 infection.  Subjective: Afebrile this morning-sleeping when I walked in-awoke-denies any chest pain or shortness of breath.  Objective: Vitals: Blood pressure (!) 178/106, pulse 91, temperature (!) 97.2 F (36.2 C), resp. rate 19, height 5\' 6"  (1.676 m), weight 54.7 kg, SpO2 98 %.   Exam: Gen Exam:Alert awake-not in any distress HEENT:atraumatic, normocephalic Chest: B/L clear to auscultation anteriorly CVS:S1S2 regular Abdomen:soft non tender, non distended Extremities: Left BKA-stump in dressing. Neurology: Non focal Skin: no rash   Pertinent Labs/Radiology: WBC: 5.2 Hb: 13.4  platelet: 127  K: 4.3  10/28>>Blood culture: No growth. 10/30>> blood culture: No growth  10/28>> CT head: No acute intracranial abnormality. 10/28>>CXR:No PNA 10/ 30>> CXR: No PNA  Assessment/Plan: Acute metabolic encephalopathy: Due to COVID-19 infection-mental status improved-suspect he is not that far from baseline.  CT head negative.  Continue supportive care.  COVID 19 infection: Afebrile this morning-blood cultures negative-no pneumonia on chest x-ray-plan on 3 days of Remdesivir.  Continue supportive care.   Mild pancytopenia: Due to COVID-19 infection-CBC continues to improve.   Hypokalemia: Repleted.  ESRD on HD MWF: Nephrology following-defer further to nephrology.  PAD-s/p left BKA: Continue supportive care-appreciate wound care input-see the note.  Left lower extremity DVT: Continue Eliquis-no longer on Plavix-(discontinued on 10/29  after discussion with Dr. Hayden Rasmussen surgery)  HTN: BP remains elevated-increase amlodipine to 10 mg.    Debility/deconditioning: Due to acute illness-suspect has some debility at baseline.  PT/OT eval following-recommendations of SNF.  Procedures: None Consults: None DVT Prophylaxis: Eliquis Code Status:Full code  Family Communication: Sister-[phone]-left voicemail on 10/30  Time spent: 35 minutes-Greater than 50% of this time was spent in counseling, explanation of diagnosis, planning of further management, and coordination of care.  Diet: Diet Order             Diet renal with fluid restriction Fluid restriction: 1200 mL Fluid; Room service appropriate? Yes; Fluid consistency: Thin  Diet effective now                      Disposition Plan: Status is: Inpatient.  The patient will require care spanning > 2 midnights and should be moved to inpatient because: Inpatient level of care-COVID-19 infection-on IV Remdesivir-debilitated-needs PT/OT before consideration of discharge.   Antimicrobial agents: Anti-infectives (From admission, onward)    Start     Dose/Rate Route Frequency Ordered Stop   08/13/21 1000  remdesivir 100 mg in sodium chloride 0.9 % 100 mL IVPB  Status:  Discontinued       See Hyperspace for full Linked Orders Report.   100 mg 200 mL/hr over 30 Minutes Intravenous Daily 08/12/21 0228 08/12/21 1038   08/13/21 1000  remdesivir 100 mg in sodium chloride 0.9 % 100 mL IVPB       See Hyperspace for full Linked Orders Report.   100 mg 200 mL/hr over 30 Minutes Intravenous Daily 08/12/21 1038 08/14/21 1016   08/12/21  0400  remdesivir 200 mg in sodium chloride 0.9% 250 mL IVPB       See Hyperspace for full Linked Orders Report.   200 mg 580 mL/hr over 30 Minutes Intravenous Once 08/12/21 0228 08/12/21 0440        MEDICATIONS: Scheduled Meds:  amLODipine  5 mg Oral Daily   apixaban  10 mg Oral BID   Followed by   Derrill Memo ON 08/19/2021]  apixaban  5 mg Oral BID   aspirin  81 mg Oral Daily   feeding supplement (NEPRO CARB STEADY)  237 mL Oral BID BM   insulin aspart  0-5 Units Subcutaneous QHS   insulin aspart  0-6 Units Subcutaneous TID WC   polyethylene glycol  17 g Oral BID   senna  1 tablet Oral Daily   Continuous Infusions:   PRN Meds:.acetaminophen **OR** acetaminophen, guaiFENesin-dextromethorphan, hydrALAZINE, oxyCODONE-acetaminophen   I have personally reviewed following labs and imaging studies  LABORATORY DATA: CBC: Recent Labs  Lab 08/11/21 1802 08/12/21 0853 08/13/21 0241 08/14/21 0421  WBC 5.2 3.7* 4.8 5.2  NEUTROABS 3.8  --   --   --   HGB 14.4 11.7* 14.3 13.4  HCT 43.1 37.5* 43.3 40.1  MCV 98.4 101.1* 96.9 94.4  PLT 141* 119* 155 127*     Basic Metabolic Panel: Recent Labs  Lab 08/11/21 1802 08/12/21 0303 08/12/21 0853 08/13/21 0241 08/14/21 0421  NA 138  --  138 136 136  K 3.3*  --  2.9* 4.4 4.3  CL 94*  --  101 97* 98  CO2 29  --  26 24 26   GLUCOSE 140*  --  87 153* 107*  BUN 14  --  14 29* 44*  CREATININE 3.76*  --  3.98* 5.60* 6.97*  CALCIUM 8.7*  --  7.0* 8.9 9.0  MG  --  2.4  --   --   --   PHOS  --   --  3.1 3.7 4.3     GFR: Estimated Creatinine Clearance: 7.5 mL/min (A) (by C-G formula based on SCr of 6.97 mg/dL (H)).  Liver Function Tests: Recent Labs  Lab 08/11/21 1802 08/12/21 0853 08/13/21 0241 08/14/21 0421  AST 26  --   --   --   ALT 19  --   --   --   ALKPHOS 100  --   --   --   BILITOT 0.8  --   --   --   PROT 8.5*  --   --   --   ALBUMIN 3.6 2.5* 3.2* 3.0*    No results for input(s): LIPASE, AMYLASE in the last 168 hours. Recent Labs  Lab 08/12/21 0303  AMMONIA 24     Coagulation Profile: No results for input(s): INR, PROTIME in the last 168 hours.  Cardiac Enzymes: No results for input(s): CKTOTAL, CKMB, CKMBINDEX, TROPONINI in the last 168 hours.  BNP (last 3 results) No results for input(s): PROBNP in the last 8760  hours.  Lipid Profile: Recent Labs    08/12/21 0111  TRIG 80     Thyroid Function Tests: Recent Labs    08/12/21 0303  TSH 2.598     Anemia Panel: Recent Labs    08/12/21 0111 08/12/21 0303  VITAMINB12  --  585  FERRITIN 971*  --      Urine analysis:    Component Value Date/Time   COLORURINE YELLOW 04/27/2021 2056   APPEARANCEUR CLOUDY (A) 04/27/2021 2056   LABSPEC 1.012  04/27/2021 2056   PHURINE 8.0 04/27/2021 2056   GLUCOSEU 50 (A) 04/27/2021 2056   HGBUR NEGATIVE 04/27/2021 2056   BILIRUBINUR NEGATIVE 04/27/2021 2056   KETONESUR NEGATIVE 04/27/2021 2056   PROTEINUR 100 (A) 04/27/2021 2056   UROBILINOGEN 0.2 10/07/2011 1618   NITRITE NEGATIVE 04/27/2021 2056   LEUKOCYTESUR LARGE (A) 04/27/2021 2056    Sepsis Labs: Lactic Acid, Venous    Component Value Date/Time   LATICACIDVEN 1.2 08/12/2021 0111    MICROBIOLOGY: Recent Results (from the past 240 hour(s))  Resp Panel by RT-PCR (Flu A&B, Covid) Nasopharyngeal Swab     Status: Abnormal   Collection Time: 08/11/21  6:02 PM   Specimen: Nasopharyngeal Swab; Nasopharyngeal(NP) swabs in vial transport medium  Result Value Ref Range Status   SARS Coronavirus 2 by RT PCR POSITIVE (A) NEGATIVE Final    Comment: RESULT CALLED TO, READ BACK BY AND VERIFIED WITH: OLDLAND,RN@0003  08/12/21 [place] (NOTE) SARS-CoV-2 target nucleic acids are DETECTED.  The SARS-CoV-2 RNA is generally detectable in upper respiratory specimens during the acute phase of infection. Positive results are indicative of the presence of the identified virus, but do not rule out bacterial infection or co-infection with other pathogens not detected by the test. Clinical correlation with patient history and other diagnostic information is necessary to determine patient infection status. The expected result is Negative.  Fact Sheet for Patients: EntrepreneurPulse.com.au  Fact Sheet for Healthcare  Providers: IncredibleEmployment.be  This test is not yet approved or cleared by the [place] FDA and  has been authorized for detection and/or diagnosis of SARS-CoV-2 by FDA under an Emergency Use Authorization (EUA).  This EUA will remain in effect (meaning this test can be used)  for the duration of  the COVID-19 declaration under Section 564(b)(1) of the Act, 21 U.S.C. section 360bbb-3(b)(1), unless the authorization is terminated or revoked sooner.     Influenza A by PCR NEGATIVE NEGATIVE Final   Influenza B by PCR NEGATIVE NEGATIVE Final    Comment: (NOTE) The Xpert Xpress SARS-CoV-2/FLU/RSV plus assay is intended as an aid in the diagnosis of influenza from Nasopharyngeal swab specimens and should not be used as a sole basis for treatment. Nasal washings and aspirates are unacceptable for Xpert Xpress SARS-CoV-2/FLU/RSV testing.  Fact Sheet for Patients: EntrepreneurPulse.com.au  Fact Sheet for Healthcare Providers: IncredibleEmployment.be  This test is not yet approved or cleared by the [place] FDA and has been authorized for detection and/or diagnosis of SARS-CoV-2 by FDA under an Emergency Use Authorization (EUA). This EUA will remain in effect (meaning this test can be used) for the duration of the COVID-19 declaration under Section 564(b)(1) of the Act, 21 U.S.C. section 360bbb-3(b)(1), unless the authorization is terminated or revoked.  Performed at [facility], [place] [address], [place], [place] [address]   Culture, blood (routine x 2)     Status: None (Preliminary result)   Collection Time: 08/11/21  6:15 PM   Specimen: BLOOD LEFT ARM  Result Value Ref Range Status   Specimen Description BLOOD LEFT ARM  Final   Special Requests   Final    BOTTLES DRAWN AEROBIC AND ANAEROBIC Blood Culture adequate volume   Culture   Final    NO GROWTH 3 DAYS Performed at [facility], [place]  [address], [place], [place] [address]    Report Status PENDING  Incomplete  Culture, blood (routine x 2)     Status: None (Preliminary result)   Collection Time: 08/13/21 12:44 PM  Specimen: BLOOD  Result Value Ref Range Status   Specimen Description BLOOD BLOOD LEFT HAND  Final   Special Requests   Final    BOTTLES DRAWN AEROBIC AND ANAEROBIC Blood Culture adequate volume   Culture   Final    NO GROWTH < 24 HOURS Performed at [facility], [address], [place], [place] [address]    Report Status PENDING  Incomplete  Culture, blood (routine x 2)     Status: None (Preliminary result)   Collection Time: 08/13/21 12:44 PM   Specimen: BLOOD  Result Value Ref Range Status   Specimen Description BLOOD LEFT ANTECUBITAL  Final   Special Requests AEROBIC BOTTLE ONLY Blood Culture adequate volume  Final   Culture   Final    NO GROWTH < 24 HOURS Performed at [facility], [place] [address], [place], [place] [address]    Report Status PENDING  Incomplete    RADIOLOGY STUDIES/RESULTS: DG Chest Port 1V same Day  Result Date: 08/13/2021 CLINICAL DATA:  Shortness of breath. EXAM: PORTABLE CHEST 1 VIEW COMPARISON:  Radiograph 2 days ago 08/11/2021 FINDINGS: Patient's chin obscures the left lung apex, could not hold head out of the field of view. Lung volumes are low. Again seen elevation of left hemidiaphragm. Postsurgical change at the left hilum. Stable heart size and mediastinal contours. Aortic atherosclerosis. Increasing atelectasis at the lung bases. Significant pleural effusion. No pneumothorax is seen. Vascular stent in the left axilla. IMPRESSION: Low lung volumes with increasing atelectasis at the lung bases. Electronically Signed   By: Keith Rake M.D.   On: 08/13/2021 15:11   VAS [place] LOWER EXTREMITY VENOUS (DVT)  Result Date: 08/13/2021  Lower Venous DVT Study Patient Name:  Roger Vazquez  Date of Exam:   08/12/2021 Medical Rec #: [id number]         Accession #:     [id number] Date of Birth: 07-09-1951         Patient Gender: M Patient Age:   32 years Exam [facility]  [facility] Procedure:      VAS [place] LOWER EXTREMITY VENOUS (DVT) Referring Phys: Wandra Feinstein RATHORE --------------------------------------------------------------------------------  Indications: Covid, elevated D-Dimer.  Risk Factors: Left BKA 06/08/21. Limitations: Orthopaedic appliance. Comparison Study: Prior negative left LEV done 04/30/21 Performing Technologist: Sharion Dove RVS  Examination Guidelines: A complete evaluation includes B-mode imaging, spectral Doppler, color Doppler, and power Doppler as needed of all accessible portions of each vessel. Bilateral testing is considered an integral part of a complete examination. Limited examinations for reoccurring indications may be performed as noted. The reflux portion of the exam is performed with the patient in reverse Trendelenburg.  +---------+---------------+---------+-----------+----------+--------------+ RIGHT    CompressibilityPhasicitySpontaneityPropertiesThrombus Aging +---------+---------------+---------+-----------+----------+--------------+ CFV      Full           Yes      Yes                                 +---------+---------------+---------+-----------+----------+--------------+ SFJ      Full                                                        +---------+---------------+---------+-----------+----------+--------------+ FV Prox  Full                                                        +---------+---------------+---------+-----------+----------+--------------+  FV Mid   Full                                                        +---------+---------------+---------+-----------+----------+--------------+ FV DistalFull                                                        +---------+---------------+---------+-----------+----------+--------------+ PFV      Full                                                         +---------+---------------+---------+-----------+----------+--------------+ POP      Full           Yes      Yes                                 +---------+---------------+---------+-----------+----------+--------------+ PTV      Full                                                        +---------+---------------+---------+-----------+----------+--------------+ PERO     Full                                                        +---------+---------------+---------+-----------+----------+--------------+   +---------+---------------+---------+-----------+----------+-------------------+ LEFT     CompressibilityPhasicitySpontaneityPropertiesThrombus Aging      +---------+---------------+---------+-----------+----------+-------------------+ CFV      Partial        Yes      Yes                  Acute               +---------+---------------+---------+-----------+----------+-------------------+ SFJ      Full                                                             +---------+---------------+---------+-----------+----------+-------------------+ FV Prox  Full                                                             +---------+---------------+---------+-----------+----------+-------------------+ FV Mid   Partial                                                          +---------+---------------+---------+-----------+----------+-------------------+  FV DistalPartial                                                          +---------+---------------+---------+-----------+----------+-------------------+ PFV      Full                                                             +---------+---------------+---------+-----------+----------+-------------------+ POP                                                   Not well visualized +---------+---------------+---------+-----------+----------+-------------------+     Summary:  RIGHT: - There is no evidence of deep vein thrombosis in the lower extremity.  - Ultrasound characteristics of enlarged lymph nodes are noted in the groin.  LEFT: - Findings consistent with acute, non occlusive deep vein thrombosis involving the left common femoral vein, and left femoral vein.  *See table(s) above for measurements and observations. Electronically signed by Jamelle Haring on 08/13/2021 at 9:28:31 AM.    Final      LOS: 2 days   Oren Binet, MD  [facility]    To contact the attending provider between 7A-7P or the covering provider during after hours 7P-7A, please log into the web site [email or web address] and access using universal [place] password for that web site. If you do not have the password, please call the hospital operator.  08/14/2021, 11:18 AM

## 2021-08-14 NOTE — Consult Note (Signed)
WOC Nurse Consult Note: Patient receiving care in [facility] 2W11 COVID positive Reason for Consult: LBKA stump Wound type: L BKA stump surgical incision healing nicely. Had steri strips which were falling off. Removed these. 50% of the incision is completely healed with 50% scabbed over. No signs of infection. Cleaned the area with NS, patted dry and then placed a piece of Xeroform gauze over the incision and wrapped with Kerlix.  Pressure Injury POA: NA Orders placed for daily dressing changes.  Monitor the wound area(s) for worsening of condition such as: Signs/symptoms of infection, increase in size, development of or worsening of odor, development of pain, or increased pain at the affected locations.   Notify the medical team if any of these develop.  Thank you for the consult. [place] nurse will not follow at this time.   Please re-consult the [place] team if needed.  Cathlean Marseilles Tamala Julian, MSN, RN, [place], [place], [facility] Wound Treatment Associate Pager [phone]

## 2021-08-14 NOTE — Progress Notes (Signed)
Subjective: seen in room, no new c/o's. O x3.   Objective Vital signs in last 24 hours: Vitals:   08/13/21 1653 08/13/21 2119 08/14/21 0622 08/14/21 0853  BP:  (!) 169/96 (!) 184/90 (!) 178/106  Pulse:  (!) 101 87 91  Resp:  16 16 19   Temp: 98.6 F (37 C) 98.7 F (37.1 C) 97.9 F (36.6 C) (!) 97.2 F (36.2 C)  TempSrc: Oral     SpO2:  100% 98% 98%  Weight:      Height:       Weight change:   Physical Exam: General: Alert thin chronically ill male NAD Heart: Tacky regular no MR G appreciated Lungs: CTA anteriorly nonlabored breathing Abdomen: NABS, soft NTND Extremities: No LE edema, status post left BKA Dialysis Access: RFA AV GG positive bruit  OP HD: [facility] MWF  3h 2min 2/2 bath  56kg  Hep 1700  RFA AVG  - mircera 150 q2, last 10/19     Assessment/Plan: Acute metabolic encephalopathy - work-up per admit, probable  COVID-19 infection related ESRD -HD MWF, continue schedule. Next HD Monday.  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, no current ESA needs , follow-up Hb Metabolic bone disease -Phos 3.7 cont binder, no current vdra listed, calcium okay Nutrition -albumin 2.5 > 3.5 ,Nepro supplement, renal diet SP left BKA- continue stump care PT OT, he is from [facility]   Kelly Splinter, MD 08/14/2021, 11:44 AM      Labs: Basic Metabolic Panel: Recent Labs  Lab 08/12/21 0853 08/13/21 0241 08/14/21 0421  NA 138 136 136  K 2.9* 4.4 4.3  CL 101 97* 98  CO2 26 24 26   GLUCOSE 87 153* 107*  BUN 14 29* 44*  CREATININE 3.98* 5.60* 6.97*  CALCIUM 7.0* 8.9 9.0  PHOS 3.1 3.7 4.3    Liver Function Tests: Recent Labs  Lab 08/11/21 1802 08/12/21 0853 08/13/21 0241 08/14/21 0421  AST 26  --   --   --   ALT 19  --   --   --   ALKPHOS 100  --   --   --   BILITOT 0.8   --   --   --   PROT 8.5*  --   --   --   ALBUMIN 3.6 2.5* 3.2* 3.0*    No results for input(s): LIPASE, AMYLASE in the last 168 hours. Recent Labs  Lab 08/12/21 0303  AMMONIA 24    CBC: Recent Labs  Lab 08/11/21 1802 08/12/21 0853 08/13/21 0241 08/14/21 0421  WBC 5.2 3.7* 4.8 5.2  NEUTROABS 3.8  --   --   --   HGB 14.4 11.7* 14.3 13.4  HCT 43.1 37.5* 43.3 40.1  MCV 98.4 101.1* 96.9 94.4  PLT 141* 119* 155 127*    Cardiac Enzymes: No results for input(s): CKTOTAL, CKMB, CKMBINDEX, TROPONINI in the last 168 hours. CBG: Recent Labs  Lab 08/13/21 0843 08/13/21 1257 08/13/21 1638 08/13/21 2120 08/14/21 0851  GLUCAP 156* 146* 133* 201* 141*      Medications:    [START ON 08/15/2021] amLODipine  10 mg Oral Daily   apixaban  10 mg Oral BID   Followed by   Derrill Memo ON 08/19/2021] apixaban  5 mg Oral BID   aspirin  81 mg Oral Daily   feeding  supplement (NEPRO CARB STEADY)  237 mL Oral BID BM   insulin aspart  0-5 Units Subcutaneous QHS   insulin aspart  0-6 Units Subcutaneous TID WC   polyethylene glycol  17 g Oral BID   senna  1 tablet Oral Daily

## 2021-08-15 ENCOUNTER — Encounter (HOSPITAL_COMMUNITY): Payer: Self-pay | Admitting: Internal Medicine

## 2021-08-15 DIAGNOSIS — U071 COVID-19: Secondary | ICD-10-CM | POA: Diagnosis not present

## 2021-08-15 DIAGNOSIS — G9341 Metabolic encephalopathy: Secondary | ICD-10-CM | POA: Diagnosis not present

## 2021-08-15 DIAGNOSIS — N186 End stage renal disease: Secondary | ICD-10-CM | POA: Diagnosis not present

## 2021-08-15 DIAGNOSIS — I10 Essential (primary) hypertension: Secondary | ICD-10-CM | POA: Diagnosis not present

## 2021-08-15 LAB — RENAL FUNCTION PANEL
Albumin: 3 g/dL — ABNORMAL LOW (ref 3.5–5.0)
Anion gap: 14 (ref 5–15)
BUN: 62 mg/dL — ABNORMAL HIGH (ref 8–23)
CO2: 26 mmol/L (ref 22–32)
Calcium: 8.8 mg/dL — ABNORMAL LOW (ref 8.9–10.3)
Chloride: 95 mmol/L — ABNORMAL LOW (ref 98–111)
Creatinine, Ser: 8.55 mg/dL — ABNORMAL HIGH (ref 0.61–1.24)
GFR, Estimated: 6 mL/min — ABNORMAL LOW (ref >60)
Glucose, Bld: 150 mg/dL — ABNORMAL HIGH (ref 70–99)
Phosphorus: 4.8 mg/dL — ABNORMAL HIGH (ref 2.5–4.6)
Potassium: 4.3 mmol/L (ref 3.5–5.1)
Sodium: 135 mmol/L (ref 135–145)

## 2021-08-15 LAB — CBC
HCT: 37.2 % — ABNORMAL LOW (ref 39.0–52.0)
Hemoglobin: 12.4 g/dL — ABNORMAL LOW (ref 13.0–17.0)
MCH: 32 pg (ref 26.0–34.0)
MCHC: 33.3 g/dL (ref 30.0–36.0)
MCV: 95.9 fL (ref 80.0–100.0)
Platelets: 148 10*3/uL — ABNORMAL LOW (ref 150–400)
RBC: 3.88 MIL/uL — ABNORMAL LOW (ref 4.22–5.81)
RDW: 16.6 % — ABNORMAL HIGH (ref 11.5–15.5)
WBC: 5.7 10*3/uL (ref 4.0–10.5)
nRBC: 0 % (ref 0.0–0.2)

## 2021-08-15 LAB — GLUCOSE, CAPILLARY
Glucose-Capillary: 172 mg/dL — ABNORMAL HIGH (ref 70–99)
Glucose-Capillary: 187 mg/dL — ABNORMAL HIGH (ref 70–99)

## 2021-08-15 MED ORDER — AMLODIPINE BESYLATE 10 MG PO TABS: 10.0000 mg | ORAL_TABLET | Freq: Every day | ORAL | Status: DC

## 2021-08-15 MED ORDER — APIXABAN 5 MG PO TABS: 5.0000 mg | ORAL_TABLET | Freq: Two times a day (BID) | ORAL | Status: DC

## 2021-08-15 MED ORDER — APIXABAN 5 MG PO TABS: 10.0000 mg | ORAL_TABLET | Freq: Two times a day (BID) | ORAL | Status: DC

## 2021-08-15 NOTE — TOC Transition Note (Signed)
Transition of Care [facility]) - CM/SW Discharge Note   Patient Details  Name: Roger Vazquez MRN: [id number] Date of Birth: 09-18-1951  Transition of Care [facility] CM/SW Contact:  Joanne Chars, LCSW Phone Number: 08/15/2021, 12:51 PM   Clinical Narrative:  Pt discharging to [facility], room 3239.  RN call report to [phone].     Final next level of care: Skilled Nursing Facility Barriers to Discharge: Barriers Resolved   Patient Goals and CMS Choice Patient states their goals for this hospitalization and ongoing recovery are:: Pt unable to participate in goal setting due to disorientation. CMS Medicare.gov Compare Post Acute Care list provided to:: Patient Represenative (must comment) Choice offered to / list presented to : Sibling  Discharge Placement              Patient chooses bed at:  ([facility]) Patient to be transferred to facility by: [place] Name of family member notified: sister [name] Patient and family notified of of transfer: 08/15/21  Discharge Plan and Services     Post Acute Care Choice: [facility]                               Social Determinants of Health (SDOH) Interventions     Readmission Risk Interventions Readmission Risk Prevention Plan 05/30/2021  Transportation Screening Complete  SW Recovery Care/Counseling Consult Complete  Skilled Nursing Facility Complete  Some recent data might be hidden

## 2021-08-15 NOTE — Progress Notes (Addendum)
Called report to [facility] @ [phone],  2 times, transferred to [address] hall and no answer.

## 2021-08-15 NOTE — Progress Notes (Signed)
Labs collected prior to HD treatment. Called lab states labs were cancelled. Stat labs placed. Specimen in lab.

## 2021-08-15 NOTE — Progress Notes (Signed)
Subjective: pt on HD in [facility] room. Patient not seen directly today given COVID-19 + status, utilizing data taken from chart +/- discussions w/ providers and staff.    Objective Vital signs in last 24 hours: Vitals:   08/15/21 1015 08/15/21 1045 08/15/21 1115 08/15/21 1122  BP: (!) 128/56 (!) 148/81 121/76 140/66  Pulse: 83  83 78  Resp:    15  Temp:    98.1 F (36.7 C)  TempSrc:    Oral  SpO2:    100%  Weight:    54.8 kg  Height:       Weight change:   Physical Exam: Patient not seen directly today given COVID-19 + status, utilizing data taken from chart +/- discussions w/ providers and staff.    OP HD: [facility] MWF  3h 54min 2/2 bath  56kg  Hep 1700  RFA AVG  - mircera 150 q2, last 10/19     Assessment/Plan: Acute metabolic encephalopathy - work-up per admit, probable  COVID-19 infection related. Improved, back to baseline.  ESRD -HD MWF. HD today off schedule (rolled over from Monday). Resume usual OP HD tomorrow.   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    Kelly Splinter, MD 08/15/2021, 12:29 PM      Labs: Basic Metabolic Panel: Recent Labs  Lab 08/13/21 0241 08/14/21 0421 08/15/21 0945  NA 136 136 135  K 4.4 4.3 4.3  CL 97* 98 95*  CO2 24 26 26   GLUCOSE 153* 107* 150*  BUN 29* 44* 62*  CREATININE 5.60* 6.97* 8.55*  CALCIUM 8.9 9.0 8.8*  PHOS 3.7 4.3 4.8*    Liver Function Tests: Recent Labs  Lab 08/11/21 1802 08/12/21 0853 08/13/21 0241 08/14/21 0421 08/15/21 0945  AST 26  --   --   --   --   ALT 19  --   --   --    --   ALKPHOS 100  --   --   --   --   BILITOT 0.8  --   --   --   --   PROT 8.5*  --   --   --   --   ALBUMIN 3.6   < > 3.2* 3.0* 3.0*   < > = values in this interval not displayed.    No results for input(s): LIPASE, AMYLASE in the last 168 hours. Recent Labs  Lab 08/12/21 0303  AMMONIA 24    CBC: Recent Labs  Lab 08/11/21 1802 08/12/21 0853 08/13/21 0241 08/14/21 0421 08/15/21 0945  WBC 5.2 3.7* 4.8 5.2 5.7  NEUTROABS 3.8  --   --   --   --   HGB 14.4 11.7* 14.3 13.4 12.4*  HCT 43.1 37.5* 43.3 40.1 37.2*  MCV 98.4 101.1* 96.9 94.4 95.9  PLT 141* 119* 155 127* 148*    Cardiac Enzymes: No results for input(s): CKTOTAL, CKMB, CKMBINDEX, TROPONINI in the last 168 hours. CBG: Recent Labs  Lab 08/13/21 2120 08/14/21 0851 08/14/21 1238 08/14/21 1601 08/14/21 2225  GLUCAP 201* 141*  186* 159* 89      Medications:    amLODipine  10 mg Oral Daily   apixaban  10 mg Oral BID   Followed by   Derrill Memo ON 08/19/2021] apixaban  5 mg Oral BID   aspirin  81 mg Oral Daily   feeding supplement (NEPRO CARB STEADY)  237 mL Oral BID BM   insulin aspart  0-5 Units Subcutaneous QHS   insulin aspart  0-6 Units Subcutaneous TID WC   polyethylene glycol  17 g Oral BID   senna  1 tablet Oral Daily

## 2021-08-15 NOTE — Discharge Summary (Signed)
PATIENT DETAILS Name: Roger Vazquez Age: 70 y.o. Sex: male Date of Birth: September 14, 1951 MRN: [id number]. Admitting Physician: Jonetta Osgood, MD WUJ:[id number], [place]  Admit Date: 08/11/2021 Discharge date: 08/15/2021  Recommendations for Outpatient Follow-up:  Follow up with PCP in 1-2 weeks Please obtain CMP/CBC in one week Follow blood cultures until final.   Admitted From:  SNF  Disposition: SNF   Home Health: No  Equipment/Devices: None  Discharge Condition: Stable  CODE STATUS: FULL CODE  Diet recommendation:  Diet Order             Diet - low sodium heart healthy           Diet renal with fluid restriction Fluid restriction: 1200 mL Fluid; Room service appropriate? Yes; Fluid consistency: Thin  Diet effective now                    Brief Summary: Patient is a 70 y.o. male with history of ESRD on HD MWF, PAD-s/p left BKA, DM-2, HTN-who presented from his hemodialysis center for evaluation of altered mental status, he was found to have COVID-19 infection.  Pertinent Labs/Radiology: 10/28>>Blood culture: No growth. 10/30>> blood culture: No growth   10/28>> CT head: No acute intracranial abnormality. 10/28>>CXR:No PNA 10/ 30>> CXR: No PNA    Brief Hospital Course: Acute metabolic encephalopathy: Due to COVID-19 infection-mental status improved-suspect he is at baseline.  CT head negative.     COVID 19 infection: Afebrile-clinically improved-blood cultures negative-no pneumonia on chest x-ray-has completed 3 days of Remdesivir.   Mild pancytopenia: Due to COVID-19 infection-CBC continues to improve.    Hypokalemia: Repleted.   ESRD on HD MWF: Nephrology followed closely during this hospital stay.  Resume outpatient hemodialysis as previous.   PAD-s/p left BKA: Continue supportive care-see wound care instructions below.   Left lower extremity DVT: Continue Eliquis-no longer on Plavix-(discontinued on 10/29 after discussion with  Dr. Hayden Rasmussen surgery)   HTN: BP stable-continue amlodipine on discharge.   Debility/deconditioning: Due to acute illness-suspect has some debility at baseline.  PT/OT eval following-recommendations of SNF.   Procedures None  Discharge Diagnoses:  Principal Problem:   COVID-19 Active Problems:   HTN (hypertension)   ESRD on hemodialysis ([place])   Peripheral vascular disease ([place])   Acute metabolic encephalopathy   COVID-19 virus infection   Discharge Instructions:  Activity:  As tolerated with Full fall precautions use walker/cane & assistance as needed  Discharge Instructions     Call MD for:  difficulty breathing, headache or visual disturbances   Complete by: As directed    Call MD for:  extreme fatigue   Complete by: As directed    Call MD for:  redness, tenderness, or signs of infection (pain, swelling, redness, odor or green/yellow discharge around incision site)   Complete by: As directed    Diet - low sodium heart healthy   Complete by: As directed    Discharge wound care:   Complete by: As directed    Clean the area with NS, pat dry and then place a piece of Xeroform gauze over the incision and wrap with Kerlix or place a heel foam dressing over it. Change daily.   Increase activity slowly   Complete by: As directed       Allergies as of 08/15/2021       Reactions   Sildenafil Other (See Comments)   Pt stated, "Makes my Blood Pressure drop"        Medication  List     STOP taking these medications    clopidogrel 75 MG tablet Commonly known as: PLAVIX   HECTOROL IV   heparin 1000 unit/mL Soln injection       TAKE these medications    acetaminophen 325 MG tablet Commonly known as: TYLENOL Take 650 mg by mouth every 6 (six) hours. What changed: Another medication with the same name was removed. Continue taking this medication, and follow the directions you see here.   amLODipine 10 MG tablet Commonly known as: NORVASC Take 1 tablet  (10 mg total) by mouth daily. Start taking on: August 16, 2021   apixaban 5 MG Tabs tablet Commonly known as: ELIQUIS Take 2 tablets (10 mg total) by mouth 2 (two) times daily for 7 doses.   apixaban 5 MG Tabs tablet Commonly known as: ELIQUIS Take 1 tablet (5 mg total) by mouth 2 (two) times daily. Start taking on: August 19, 2021   aspirin 81 MG chewable tablet Chew 81 mg by mouth daily. (0800)   calcium acetate 667 MG capsule Commonly known as: PHOSLO Take 667 mg by mouth 3 (three) times daily with meals. (0800, 1300 & 1700)   ferrous sulfate 325 (65 FE) MG tablet Take 325 mg by mouth daily with breakfast.   hydrocortisone 25 MG suppository Commonly known as: ANUSOL-HC Place 25 mg rectally 2 (two) times daily as needed for hemorrhoids or anal itching.   lidocaine-prilocaine cream Commonly known as: EMLA Apply 1 application topically See admin instructions. As needed on dialysis days.(Monday,Wednesday and Friday)   multivitamin with minerals Tabs tablet Take 1 tablet by mouth daily.   NOVASOURCE RENAL PO Take 237 mLs by mouth daily.   polyethylene glycol 17 g packet Commonly known as: MIRALAX / GLYCOLAX Take 17 g by mouth 2 (two) times daily.   senna 8.6 MG Tabs tablet Commonly known as: SENOKOT Take 1 tablet (8.6 mg total) by mouth daily.               Discharge Care Instructions  (From admission, onward)           Start     Ordered   08/15/21 0000  Discharge wound care:       Comments: Clean the area with NS, pat dry and then place a piece of Xeroform gauze over the incision and wrap with Kerlix or place a heel foam dressing over it. Change daily.   08/15/21 1109            Follow-up Information     Clinic, [facility] Schedule an appointment as soon as possible for a visit in 1 week(s).   Contact information: [place] [address] [phone]         Hemodialysis clinic Follow up.   Why:  Follow-up as per your prior schedule.               Allergies  Allergen Reactions   Sildenafil Other (See Comments)    Pt stated, "Makes my Blood Pressure drop"      Consultations: Nephrology.   Other Procedures/Studies: DG Chest 2 View  Result Date: 08/11/2021 CLINICAL DATA:  Fever.  Disoriented. EXAM: CHEST - 2 VIEW COMPARISON:  One-view chest x-ray 05/27/2021 FINDINGS: Heart is mildly enlarged. Atherosclerotic calcifications present at the aortic arch. Left hemidiaphragm is chronically elevated. The lungs are clear. Axial skeleton is unremarkable. IMPRESSION: No acute cardiopulmonary disease. Electronically Signed   By: San Morelle M.D.   On: 08/11/2021  19:05   CT HEAD WO CONTRAST (5MM)  Result Date: 08/11/2021 CLINICAL DATA:  Altered level of consciousness EXAM: CT HEAD WITHOUT CONTRAST TECHNIQUE: Contiguous axial images were obtained from the base of the skull through the vertex without intravenous contrast. COMPARISON:  05/19/2021 FINDINGS: Brain: No acute infarct or hemorrhage. Stable chronic small vessel ischemic changes within the periventricular white matter and basal ganglia. Lateral ventricles and midline structures are unremarkable. No acute extra-axial fluid collections. No mass effect. Vascular: Stable atherosclerosis.  No hyperdense vessel. Skull: Normal. Negative for fracture or focal lesion. Sinuses/Orbits: No acute finding. Other: None. IMPRESSION: 1. Stable head CT, no acute intracranial process. Electronically Signed   By: Randa Ngo M.D.   On: 08/11/2021 19:16   DG Chest Port 1V same Day  Result Date: 08/13/2021 CLINICAL DATA:  Shortness of breath. EXAM: PORTABLE CHEST 1 VIEW COMPARISON:  Radiograph 2 days ago 08/11/2021 FINDINGS: Patient's chin obscures the left lung apex, could not hold head out of the field of view. Lung volumes are low. Again seen elevation of left hemidiaphragm. Postsurgical change at the left hilum. Stable heart size and  mediastinal contours. Aortic atherosclerosis. Increasing atelectasis at the lung bases. Significant pleural effusion. No pneumothorax is seen. Vascular stent in the left axilla. IMPRESSION: Low lung volumes with increasing atelectasis at the lung bases. Electronically Signed   By: Keith Rake M.D.   On: 08/13/2021 15:11   VAS [place] LOWER EXTREMITY VENOUS (DVT)  Result Date: 08/13/2021  Lower Venous DVT Study Patient Name:  SIYON LINCK  Date of Exam:   08/12/2021 Medical Rec #: [id number]         Accession #:    [id number] Date of Birth: 04-14-1951         Patient Gender: M Patient Age:   83 years Exam Location:  [facility] Procedure:      VAS [place] LOWER EXTREMITY VENOUS (DVT) Referring Phys: Wandra Feinstein RATHORE --------------------------------------------------------------------------------  Indications: Covid, elevated D-Dimer.  Risk Factors: Left BKA 06/08/21. Limitations: Orthopaedic appliance. Comparison Study: Prior negative left LEV done 04/30/21 Performing Technologist: Sharion Dove RVS  Examination Guidelines: A complete evaluation includes B-mode imaging, spectral Doppler, color Doppler, and power Doppler as needed of all accessible portions of each vessel. Bilateral testing is considered an integral part of a complete examination. Limited examinations for reoccurring indications may be performed as noted. The reflux portion of the exam is performed with the patient in reverse Trendelenburg.  +---------+---------------+---------+-----------+----------+--------------+ RIGHT    CompressibilityPhasicitySpontaneityPropertiesThrombus Aging +---------+---------------+---------+-----------+----------+--------------+ CFV      Full           Yes      Yes                                 +---------+---------------+---------+-----------+----------+--------------+ SFJ      Full                                                         +---------+---------------+---------+-----------+----------+--------------+ FV Prox  Full                                                        +---------+---------------+---------+-----------+----------+--------------+  FV Mid   Full                                                        +---------+---------------+---------+-----------+----------+--------------+ FV DistalFull                                                        +---------+---------------+---------+-----------+----------+--------------+ PFV      Full                                                        +---------+---------------+---------+-----------+----------+--------------+ POP      Full           Yes      Yes                                 +---------+---------------+---------+-----------+----------+--------------+ PTV      Full                                                        +---------+---------------+---------+-----------+----------+--------------+ PERO     Full                                                        +---------+---------------+---------+-----------+----------+--------------+   +---------+---------------+---------+-----------+----------+-------------------+ LEFT     CompressibilityPhasicitySpontaneityPropertiesThrombus Aging      +---------+---------------+---------+-----------+----------+-------------------+ CFV      Partial        Yes      Yes                  Acute               +---------+---------------+---------+-----------+----------+-------------------+ SFJ      Full                                                             +---------+---------------+---------+-----------+----------+-------------------+ FV Prox  Full                                                             +---------+---------------+---------+-----------+----------+-------------------+ FV Mid   Partial                                                           +---------+---------------+---------+-----------+----------+-------------------+  FV DistalPartial                                                          +---------+---------------+---------+-----------+----------+-------------------+ PFV      Full                                                             +---------+---------------+---------+-----------+----------+-------------------+ POP                                                   Not well visualized +---------+---------------+---------+-----------+----------+-------------------+     Summary: RIGHT: - There is no evidence of deep vein thrombosis in the lower extremity.  - Ultrasound characteristics of enlarged lymph nodes are noted in the groin.  LEFT: - Findings consistent with acute, non occlusive deep vein thrombosis involving the left common femoral vein, and left femoral vein.  *See table(s) above for measurements and observations. Electronically signed by Jamelle Haring on 08/13/2021 at 9:28:31 AM.    Final      TODAY-DAY OF DISCHARGE:  Subjective:   [name] today has no headache,no chest abdominal pain,no new weakness tingling or numbness, feels much better wants to go home today.   Objective:   Blood pressure (!) 148/81, pulse 83, temperature 98 F (36.7 C), temperature source Oral, resp. rate 19, height 5\' 6"  (1.676 m), weight 56.1 kg, SpO2 100 %. No intake or output data in the 24 hours ending 08/15/21 1110 Filed Weights   08/11/21 2222 08/12/21 1742 08/15/21 0817  Weight: 62.6 kg 54.7 kg 56.1 kg    Exam: Awake Alert, Oriented *3, No new F.N deficits, Normal affect [place].AT,PERRAL Supple Neck,No JVD, No cervical lymphadenopathy appriciated.  Symmetrical Chest wall movement, Good air movement bilaterally, CTAB RRR,No Gallops,Rubs or new Murmurs, No Parasternal Heave +ve B.Sounds, Abd Soft, Non tender, No organomegaly appriciated, No rebound -guarding or rigidity. No Cyanosis, Clubbing or edema,  No new Rash or bruise   PERTINENT RADIOLOGIC STUDIES: DG Chest Port 1V same Day  Result Date: 08/13/2021 CLINICAL DATA:  Shortness of breath. EXAM: PORTABLE CHEST 1 VIEW COMPARISON:  Radiograph 2 days ago 08/11/2021 FINDINGS: Patient's chin obscures the left lung apex, could not hold head out of the field of view. Lung volumes are low. Again seen elevation of left hemidiaphragm. Postsurgical change at the left hilum. Stable heart size and mediastinal contours. Aortic atherosclerosis. Increasing atelectasis at the lung bases. Significant pleural effusion. No pneumothorax is seen. Vascular stent in the left axilla. IMPRESSION: Low lung volumes with increasing atelectasis at the lung bases. Electronically Signed   By: Keith Rake M.D.   On: 08/13/2021 15:11     PERTINENT LAB RESULTS: CBC: Recent Labs    08/13/21 0241 08/14/21 0421  WBC 4.8 5.2  HGB 14.3 13.4  HCT 43.3 40.1  PLT 155 127*   CMET CMP     Component Value Date/Time   NA 136 08/14/2021 0421   K 4.3 08/14/2021 0421   CL 98  08/14/2021 0421   CO2 26 08/14/2021 0421   GLUCOSE 107 (H) 08/14/2021 0421   BUN 44 (H) 08/14/2021 0421   CREATININE 6.97 (H) 08/14/2021 0421   CALCIUM 9.0 08/14/2021 0421   CALCIUM 7.1 (L) 08/01/2018 0944   PROT 8.5 (H) 08/11/2021 1802   ALBUMIN 3.0 (L) 08/14/2021 0421   AST 26 08/11/2021 1802   ALT 19 08/11/2021 1802   ALKPHOS 100 08/11/2021 1802   BILITOT 0.8 08/11/2021 1802   GFRNONAA 8 (L) 08/14/2021 0421   GFRAA 5 (L) 08/11/2018 0858    GFR Estimated Creatinine Clearance: 7.7 mL/min (A) (by C-G formula based on SCr of 6.97 mg/dL (H)). No results for input(s): LIPASE, AMYLASE in the last 72 hours. No results for input(s): CKTOTAL, CKMB, CKMBINDEX, TROPONINI in the last 72 hours. Invalid input(s): POCBNP Recent Labs    08/13/21 0241  DDIMER 2.12*   No results for input(s): HGBA1C in the last 72 hours. No results for input(s): CHOL, HDL, LDLCALC, TRIG, CHOLHDL, LDLDIRECT in the  last 72 hours. No results for input(s): TSH, T4TOTAL, T3FREE, THYROIDAB in the last 72 hours.  Invalid input(s): FREET3 No results for input(s): VITAMINB12, FOLATE, FERRITIN, TIBC, IRON, RETICCTPCT in the last 72 hours. Coags: No results for input(s): INR in the last 72 hours.  Invalid input(s): PT Microbiology: Recent Results (from the past 240 hour(s))  Resp Panel by RT-PCR (Flu A&B, Covid) Nasopharyngeal Swab     Status: Abnormal   Collection Time: 08/11/21  6:02 PM   Specimen: Nasopharyngeal Swab; Nasopharyngeal(NP) swabs in vial transport medium  Result Value Ref Range Status   SARS Coronavirus 2 by RT PCR POSITIVE (A) NEGATIVE Final    Comment: RESULT CALLED TO, READ BACK BY AND VERIFIED WITH: OLDLAND,RN@0003  08/12/21 [place] (NOTE) SARS-CoV-2 target nucleic acids are DETECTED.  The SARS-CoV-2 RNA is generally detectable in upper respiratory specimens during the acute phase of infection. Positive results are indicative of the presence of the identified virus, but do not rule out bacterial infection or co-infection with other pathogens not detected by the test. Clinical correlation with patient history and other diagnostic information is necessary to determine patient infection status. The expected result is Negative.  Fact Sheet for Patients: EntrepreneurPulse.com.au  Fact Sheet for Healthcare Providers: IncredibleEmployment.be  This test is not yet approved or cleared by the [place] FDA and  has been authorized for detection and/or diagnosis of SARS-CoV-2 by FDA under an Emergency Use Authorization (EUA).  This EUA will remain in effect (meaning this test can be used)  for the duration of  the COVID-19 declaration under Section 564(b)(1) of the Act, 21 U.S.C. section 360bbb-3(b)(1), unless the authorization is terminated or revoked sooner.     Influenza A by PCR NEGATIVE NEGATIVE Final   Influenza B by PCR NEGATIVE NEGATIVE Final     Comment: (NOTE) The Xpert Xpress SARS-CoV-2/FLU/RSV plus assay is intended as an aid in the diagnosis of influenza from Nasopharyngeal swab specimens and should not be used as a sole basis for treatment. Nasal washings and aspirates are unacceptable for Xpert Xpress SARS-CoV-2/FLU/RSV testing.  Fact Sheet for Patients: EntrepreneurPulse.com.au  Fact Sheet for Healthcare Providers: IncredibleEmployment.be  This test is not yet approved or cleared by the [place] FDA and has been authorized for detection and/or diagnosis of SARS-CoV-2 by FDA under an Emergency Use Authorization (EUA). This EUA will remain in effect (meaning this test can be used) for the duration of the COVID-19 declaration under Section 564(b)(1) of the Act,  21 U.S.C. section 360bbb-3(b)(1), unless the authorization is terminated or revoked.  Performed at [facility], [place] [address], [place], [place] [address]   Culture, blood (routine x 2)     Status: None (Preliminary result)   Collection Time: 08/11/21  6:15 PM   Specimen: BLOOD LEFT ARM  Result Value Ref Range Status   Specimen Description BLOOD LEFT ARM  Final   Special Requests   Final    BOTTLES DRAWN AEROBIC AND ANAEROBIC Blood Culture adequate volume   Culture   Final    NO GROWTH 4 DAYS Performed at [facility], [place] [address] [place], [place] [address]    Report Status PENDING  Incomplete  Culture, blood (routine x 2)     Status: None (Preliminary result)   Collection Time: 08/13/21 12:44 PM   Specimen: BLOOD  Result Value Ref Range Status   Specimen Description BLOOD BLOOD LEFT HAND  Final   Special Requests   Final    BOTTLES DRAWN AEROBIC AND ANAEROBIC Blood Culture adequate volume   Culture   Final    NO GROWTH 2 DAYS Performed at [facility], [place] [address] [place], [place] [address]    Report Status PENDING  Incomplete  Culture, blood (routine x 2)     Status: None  (Preliminary result)   Collection Time: 08/13/21 12:44 PM   Specimen: BLOOD  Result Value Ref Range Status   Specimen Description BLOOD LEFT ANTECUBITAL  Final   Special Requests AEROBIC BOTTLE ONLY Blood Culture adequate volume  Final   Culture   Final    NO GROWTH 2 DAYS Performed at [facility], [place] [address], [place], [place] [address]    Report Status PENDING  Incomplete    FURTHER DISCHARGE INSTRUCTIONS:  Get Medicines reviewed and adjusted: Please take all your medications with you for your next visit with your Primary MD  Laboratory/radiological data: Please request your Primary MD to go over all hospital tests and procedure/radiological results at the follow up, please ask your Primary MD to get all Hospital records sent to his/her office.  In some cases, they will be blood work, cultures and biopsy results pending at the time of your discharge. Please request that your primary care M.D. goes through all the records of your hospital data and follows up on these results.  Also Note the following: If you experience worsening of your admission symptoms, develop shortness of breath, life threatening emergency, suicidal or homicidal thoughts you must seek medical attention immediately by calling 911 or calling your MD immediately  if symptoms less severe.  You must read complete instructions/literature along with all the possible adverse reactions/side effects for all the Medicines you take and that have been prescribed to you. Take any new Medicines after you have completely understood and accpet all the possible adverse reactions/side effects.   Do not drive when taking Pain medications or sleeping medications (Benzodaizepines)  Do not take more than prescribed Pain, Sleep and Anxiety Medications. It is not advisable to combine anxiety,sleep and pain medications without talking with your primary care practitioner  Special Instructions: If you have smoked or chewed  Tobacco  in the last 2 yrs please stop smoking, stop any regular Alcohol  and or any Recreational drug use.  Wear Seat belts while driving.  Please note: You were cared for by a hospitalist during your hospital stay. Once you are discharged, your primary care physician will handle any further  medical issues. Please note that NO REFILLS for any discharge medications will be authorized once you are discharged, as it is imperative that you return to your primary care physician (or establish a relationship with a primary care physician if you do not have one) for your post hospital discharge needs so that they can reassess your need for medications and monitor your lab values.  Total Time spent coordinating discharge including counseling, education and face to face time equals 35 minutes.  Signed: Teagon Kron 08/15/2021 11:10 AM

## 2021-08-15 NOTE — Progress Notes (Signed)
Spoke to [employer], Agricultural consultant, at [employer]. Clinic aware pt tested positive for covid. Pt will resume his previous schedule of MWF (arrive at 11:50 for 12:10 chair time) at d/c. However, snf will need to take pt to the back door of clinic where staff will meet pt to provide N95 mask and complete covid test. SNF will need to advise clinic of pt's arrival. This information was provided to CSW to provide to snf.    Melven Sartorius Renal Navigator [phone]

## 2021-08-16 LAB — CBC
HCT: 38.5 % — ABNORMAL LOW (ref 39.0–52.0)
Hemoglobin: 12.6 g/dL — ABNORMAL LOW (ref 13.0–17.0)
MCH: 31.3 pg (ref 26.0–34.0)
MCHC: 32.7 g/dL (ref 30.0–36.0)
MCV: 95.5 fL (ref 80.0–100.0)
Platelets: 144 10*3/uL — ABNORMAL LOW (ref 150–400)
RBC: 4.03 MIL/uL — ABNORMAL LOW (ref 4.22–5.81)
RDW: 16.4 % — ABNORMAL HIGH (ref 11.5–15.5)
WBC: 4.4 10*3/uL (ref 4.0–10.5)
nRBC: 0 % (ref 0.0–0.2)

## 2021-08-16 LAB — GLUCOSE, CAPILLARY: Glucose-Capillary: 149 mg/dL — ABNORMAL HIGH (ref 70–99)

## 2021-08-16 LAB — RENAL FUNCTION PANEL
Albumin: 3.2 g/dL — ABNORMAL LOW (ref 3.5–5.0)
Anion gap: 14 (ref 5–15)
BUN: 37 mg/dL — ABNORMAL HIGH (ref 8–23)
CO2: 23 mmol/L (ref 22–32)
Calcium: 8.6 mg/dL — ABNORMAL LOW (ref 8.9–10.3)
Chloride: 98 mmol/L (ref 98–111)
Creatinine, Ser: 6.38 mg/dL — ABNORMAL HIGH (ref 0.61–1.24)
GFR, Estimated: 9 mL/min — ABNORMAL LOW (ref >60)
Glucose, Bld: 115 mg/dL — ABNORMAL HIGH (ref 70–99)
Phosphorus: 3.6 mg/dL (ref 2.5–4.6)
Potassium: 3.9 mmol/L (ref 3.5–5.1)
Sodium: 135 mmol/L (ref 135–145)

## 2021-08-16 LAB — CULTURE, BLOOD (ROUTINE X 2)
Culture: NO GROWTH
Special Requests: ADEQUATE

## 2021-08-16 NOTE — Progress Notes (Signed)
Pt d/c from unit, attempted to call report 3 x this morning, @ [phone], however no answer, pt on his way to [facility] via [facility]

## 2021-08-16 NOTE — Progress Notes (Signed)
Final attempt to call report to [facility] , spoke with and gave report the director of nursing , [place]

## 2021-08-16 NOTE — Progress Notes (Signed)
Pt foind of the floor after bed alarm was activated. Pt said he was trying to go to the bathroom and climbed out of bed himself. No s/s of injury noted. MD notified. Pt declined to have sister notified said he will let her know himself. Bed alarm continues to be in use

## 2021-08-18 LAB — CULTURE, BLOOD (ROUTINE X 2)
Culture: NO GROWTH
Culture: NO GROWTH
Special Requests: ADEQUATE
Special Requests: ADEQUATE

## 2021-10-10 ENCOUNTER — Ambulatory Visit (HOSPITAL_COMMUNITY)
Admission: RE | Admit: 2021-10-10 | Discharge: 2021-10-10 | Disposition: A | Discharge status: Home / Self Care | Payer: Medicare HMO | Source: Ambulatory Visit | Attending: Vascular Surgery | Admitting: Vascular Surgery

## 2021-10-10 ENCOUNTER — Ambulatory Visit (INDEPENDENT_AMBULATORY_CARE_PROVIDER_SITE_OTHER): Payer: Medicare HMO | Admitting: Physician Assistant

## 2021-10-10 ENCOUNTER — Other Ambulatory Visit: Payer: Self-pay

## 2021-10-10 ENCOUNTER — Ambulatory Visit: Payer: Medicare HMO

## 2021-10-10 ENCOUNTER — Encounter (HOSPITAL_COMMUNITY): Payer: Medicare HMO

## 2021-10-10 ENCOUNTER — Other Ambulatory Visit: Payer: Self-pay | Admitting: Physician Assistant

## 2021-10-10 VITALS — BP 159/90 | HR 82 | Temp 98.0°F

## 2021-10-10 DIAGNOSIS — S91301A Unspecified open wound, right foot, initial encounter: Secondary | ICD-10-CM

## 2021-10-10 DIAGNOSIS — Z89512 Acquired absence of left leg below knee: Secondary | ICD-10-CM | POA: Diagnosis not present

## 2021-10-10 DIAGNOSIS — I96 Gangrene, not elsewhere classified: Secondary | ICD-10-CM | POA: Diagnosis not present

## 2021-10-10 NOTE — Progress Notes (Signed)
Office Note     CC:  follow up Requesting Provider:  Clinic, [facility]  HPI: Roger Vazquez is a 70 y.o. (06/05/1951) male who presents for evaluation of right heel wound.  Surgical history significant for endovascular revascularization of left lower extremity prior to toe amputation which progressed to TMA and eventually to below the knee amputation.  He is accompanied by his sister today and resides at [facility].  Right heel wound has been present for weeks to months and is causing him tremendous amount of pain.  He is concerned about losing his right leg.  Past medical history also significant for end-stage renal disease on hemodialysis on a Monday Wednesday Friday schedule.  He is on Eliquis due to history of positive hypercoagulable work-up.   Past Medical History:  Diagnosis Date   Anemia    Blood dyscrasia    history of coagulation deficit per [facility] LPN nursing home   Cancer [facility])    Lung   Diabetes mellitus    Hepatitis    Hypertension    Renal disorder    MWF- [facility]    Past Surgical History:  Procedure Laterality Date   ABDOMINAL AORTOGRAM W/LOWER EXTREMITY Left 04/28/2021   Procedure: ABDOMINAL AORTOGRAM W/LOWER EXTREMITY;  Surgeon: Cherre Robins, MD;  Location: [place] [facility];  Service: Cardiovascular;  Laterality: Left;   AMPUTATION Left 05/04/2021   Procedure: LEFT SECOND TOE AMPUTATION;  Surgeon: Cherre Robins, MD;  Location: [place];  Service: Vascular;  Laterality: Left;   AMPUTATION Left 06/08/2021   Procedure: LEFT BELOW KNEE AMPUTATION;  Surgeon: Cherre Robins, MD;  Location: [facility];  Service: Vascular;  Laterality: Left;   APPLICATION OF WOUND VAC Left 06/08/2021   Procedure: APPLICATION OF WOUND VAC;  Surgeon: Cherre Robins, MD;  Location: [place];  Service: Vascular;  Laterality: Left;   artiovenous graft placement Left    AV FISTULA PLACEMENT Right 11/27/2018   Procedure: INSERTION 4-7MM X 45CM GORETEX  GRAFT RIGHT FOREARM ;  Surgeon: Rosetta Posner, MD;  Location: [place];  Service: Vascular;  Laterality: Right;   [place] REMOVAL Left 08/06/2018   Procedure: REMOVAL OF LEFT ARM GRAFT;  Surgeon: Rosetta Posner, MD;  Location: [place];  Service: Vascular;  Laterality: Left;   INSERTION OF DIALYSIS CATHETER Right 08/08/2018   Procedure: INSERTION OF TUNNELED  DIALYSIS CATHETER;  Surgeon: Marty Heck, MD;  Location: [place];  Service: Vascular;  Laterality: Right;   IR DIALY SHUNT INTRO [place] W/IMG RIGHT Right 05/26/2021   IR REMOVAL TUN CV CATH W/O FL  02/19/2019   IR US GUIDE VASC ACCESS RIGHT  05/26/2021   LUNG SURGERY     patient not if they removed part of lung. Was done at [place] Left 04/28/2021   Procedure: PERIPHERAL VASCULAR BALLOON ANGIOPLASTY;  Surgeon: Cherre Robins, MD;  Location: [place] [facility];  Service: Cardiovascular;  Laterality: Left;  popliteal and posterial tibial   TEE WITHOUT CARDIOVERSION N/A 07/31/2018   Procedure: TRANSESOPHAGEAL ECHOCARDIOGRAM (TEE);  Surgeon: Pixie Casino, MD;  Location: [facility] ENDOSCOPY;  Service: Cardiovascular;  Laterality: N/A;   TRANSMETATARSAL AMPUTATION Left 05/22/2021   Procedure: LEFT TRANSMETATARSAL AMPUTATION;  Surgeon: Elam Dutch, MD;  Location: [facility] OR;  Service: Vascular;  Laterality: Left;    Social History   Socioeconomic History   Marital status: Divorced    Spouse name: Not on file  Number of children: Not on file   Years of education: Not on file   Highest education level: Not on file  Occupational History   Not on file  Tobacco Use   Smoking status: Former    Packs/day: 0.50    Years: 10.00    Pack years: 5.00    Types: Cigarettes    Quit date: 2001    Years since quitting: 22.0   Smokeless tobacco: Former    Quit date: 08/01/2015  Vaping Use   Vaping Use: Never used  Substance and Sexual Activity   Alcohol use: Yes    Alcohol/week: 2.0 standard  drinks    Types: 2 Cans of beer per week   Drug use: Yes    Types: Marijuana    Comment: last time 11/19/2018   Sexual activity: Not on file    Comment: Pt uses vicodin  Other Topics Concern   Not on file  Social History Narrative   Not on file   Social Determinants of Health   Financial Resource Strain: Not on file  Food Insecurity: Not on file  Transportation Needs: Not on file  Physical Activity: Not on file  Stress: Not on file  Social Connections: Not on file  Intimate Partner Violence: Not on file   History reviewed. No pertinent family history.  Current Outpatient Medications  Medication Sig Dispense Refill   acetaminophen (TYLENOL) 325 MG tablet Take 650 mg by mouth every 6 (six) hours.     amLODipine (NORVASC) 10 MG tablet Take 1 tablet (10 mg total) by mouth daily.     apixaban (ELIQUIS) 5 MG TABS tablet Take 1 tablet (5 mg total) by mouth 2 (two) times daily. 60 tablet    aspirin 81 MG chewable tablet Chew 81 mg by mouth daily. (0800)     calcium acetate (PHOSLO) 667 MG capsule Take 667 mg by mouth 3 (three) times daily with meals. (0800, 1300 & 1700)     ferrous sulfate 325 (65 FE) MG tablet Take 325 mg by mouth daily with breakfast.     lidocaine-prilocaine (EMLA) cream Apply 1 application topically See admin instructions. As needed on dialysis days.(Monday,Wednesday and Friday)     Multiple Vitamin (MULITIVITAMIN WITH MINERALS) TABS Take 1 tablet by mouth daily. 30 tablet 0   Nutritional Supplements (NOVASOURCE RENAL PO) Take 237 mLs by mouth daily.     polyethylene glycol (MIRALAX / GLYCOLAX) 17 g packet Take 17 g by mouth 2 (two) times daily. 14 each 0   senna (SENOKOT) 8.6 MG TABS tablet Take 1 tablet (8.6 mg total) by mouth daily. 7 tablet 0   apixaban (ELIQUIS) 5 MG TABS tablet Take 2 tablets (10 mg total) by mouth 2 (two) times daily for 7 doses. 60 tablet    hydrocortisone (ANUSOL-HC) 25 MG suppository Place 25 mg rectally 2 (two) times daily as needed for  hemorrhoids or anal itching. (Patient not taking: Reported on 10/10/2021)     No current facility-administered medications for this visit.    Allergies  Allergen Reactions   Sildenafil Other (See Comments)    Pt stated, "Makes my Blood Pressure drop"     REVIEW OF SYSTEMS:   [X]  denotes positive finding, [ ]  denotes negative finding Cardiac  Comments:  Chest pain or chest pressure:    Shortness of breath upon exertion:    Short of breath when lying flat:    Irregular heart rhythm:        Vascular  Pain in calf, thigh, or hip brought on by ambulation:    Pain in feet at night that wakes you up from your sleep:     Blood clot in your veins:    Leg swelling:         Pulmonary    Oxygen at home:    Productive cough:     Wheezing:         Neurologic    Sudden weakness in arms or legs:     Sudden numbness in arms or legs:     Sudden onset of difficulty speaking or slurred speech:    Temporary loss of vision in one eye:     Problems with dizziness:         Gastrointestinal    Blood in stool:     Vomited blood:         Genitourinary    Burning when urinating:     Blood in urine:        Psychiatric    Major depression:         Hematologic    Bleeding problems:    Problems with blood clotting too easily:        Skin    Rashes or ulcers:        Constitutional    Fever or chills:      PHYSICAL EXAMINATION:  Vitals:   10/10/21 1114  BP: (!) 159/90  Pulse: 82  Temp: 98 F (36.7 C)  SpO2: 100%    General:  WDWN in NAD; vital signs documented above Gait: Not observed HENT: WNL, normocephalic Pulmonary: normal non-labored breathing , without Rales, rhonchi,  wheezing Cardiac: regular HR Abdomen: soft, NT, no masses Skin: without rashes Vascular Exam/Pulses:  Right Left  Radial 2+ (normal) 2+ (normal)  DP Brisk doppler bka  PT Brisk doppler bka   Extremities: Extensive right heel wound Musculoskeletal: no muscle wasting or atrophy  Neurologic:  A&O X 3;  No focal weakness or paresthesias are detected Psychiatric:  The pt has Normal affect.   Non-Invasive Vascular Imaging:   Noncompressible tibial vessels with TBI of 0.67    ASSESSMENT/PLAN:: 70 y.o. male with right heel wound  -Left below the knee amputation well-healed; okay to pursue prosthetic leg if patient is interested -Patient now has a right heel wound; right lower extremity was not looked at with prior angiography.  He does have a brisk DP and PT signal by Doppler on exam.  Plan will be for aortogram with right lower extremity runoff and possible intervention by Dr. Stanford Breed in the next 1 to 2 weeks.  I encouraged the patient to avoid all pressure to his right heel when in bed or in the chair.  He should also cleanse this wound daily with soap and water and can also paint with Betadine if it has a wet appearance.  Patient and his sister agreed to proceed with angiography after the nature of the procedure and all risks were described.   Dagoberto Ligas, PA-C [facility] [phone]  Clinic MD:   Stanford Breed

## 2021-10-11 ENCOUNTER — Ambulatory Visit: Payer: No Typology Code available for payment source

## 2021-10-11 ENCOUNTER — Encounter (HOSPITAL_COMMUNITY): Payer: Medicare HMO

## 2021-10-12 ENCOUNTER — Emergency Department (HOSPITAL_COMMUNITY): Payer: No Typology Code available for payment source

## 2021-10-12 ENCOUNTER — Encounter (HOSPITAL_COMMUNITY): Payer: Self-pay

## 2021-10-12 ENCOUNTER — Inpatient Hospital Stay (HOSPITAL_COMMUNITY)
Admission: EM | Admit: 2021-10-12 | Discharge: 2021-10-24 | DRG: 981 | Disposition: A | Discharge status: Skilled Nursing Facility | Payer: No Typology Code available for payment source | Source: Skilled Nursing Facility | Attending: Internal Medicine | Admitting: Internal Medicine

## 2021-10-12 DIAGNOSIS — D649 Anemia, unspecified: Secondary | ICD-10-CM | POA: Diagnosis not present

## 2021-10-12 DIAGNOSIS — Z993 Dependence on wheelchair: Secondary | ICD-10-CM

## 2021-10-12 DIAGNOSIS — R591 Generalized enlarged lymph nodes: Secondary | ICD-10-CM

## 2021-10-12 DIAGNOSIS — I120 Hypertensive chronic kidney disease with stage 5 chronic kidney disease or end stage renal disease: Secondary | ICD-10-CM | POA: Diagnosis present

## 2021-10-12 DIAGNOSIS — Z79899 Other long term (current) drug therapy: Secondary | ICD-10-CM

## 2021-10-12 DIAGNOSIS — D631 Anemia in chronic kidney disease: Secondary | ICD-10-CM | POA: Diagnosis present

## 2021-10-12 DIAGNOSIS — N3090 Cystitis, unspecified without hematuria: Secondary | ICD-10-CM | POA: Diagnosis present

## 2021-10-12 DIAGNOSIS — E1122 Type 2 diabetes mellitus with diabetic chronic kidney disease: Secondary | ICD-10-CM

## 2021-10-12 DIAGNOSIS — Z86718 Personal history of other venous thrombosis and embolism: Secondary | ICD-10-CM

## 2021-10-12 DIAGNOSIS — Z7982 Long term (current) use of aspirin: Secondary | ICD-10-CM

## 2021-10-12 DIAGNOSIS — Z89512 Acquired absence of left leg below knee: Secondary | ICD-10-CM

## 2021-10-12 DIAGNOSIS — K253 Acute gastric ulcer without hemorrhage or perforation: Secondary | ICD-10-CM

## 2021-10-12 DIAGNOSIS — Z87891 Personal history of nicotine dependence: Secondary | ICD-10-CM

## 2021-10-12 DIAGNOSIS — Z888 Allergy status to other drugs, medicaments and biological substances status: Secondary | ICD-10-CM

## 2021-10-12 DIAGNOSIS — Z8616 Personal history of COVID-19: Secondary | ICD-10-CM

## 2021-10-12 DIAGNOSIS — Z20822 Contact with and (suspected) exposure to covid-19: Secondary | ICD-10-CM | POA: Diagnosis present

## 2021-10-12 DIAGNOSIS — M898X9 Other specified disorders of bone, unspecified site: Secondary | ICD-10-CM | POA: Diagnosis present

## 2021-10-12 DIAGNOSIS — L97419 Non-pressure chronic ulcer of right heel and midfoot with unspecified severity: Secondary | ICD-10-CM | POA: Diagnosis present

## 2021-10-12 DIAGNOSIS — D62 Acute posthemorrhagic anemia: Secondary | ICD-10-CM | POA: Diagnosis present

## 2021-10-12 DIAGNOSIS — K254 Chronic or unspecified gastric ulcer with hemorrhage: Principal | ICD-10-CM | POA: Diagnosis present

## 2021-10-12 DIAGNOSIS — K219 Gastro-esophageal reflux disease without esophagitis: Secondary | ICD-10-CM | POA: Diagnosis present

## 2021-10-12 DIAGNOSIS — I82409 Acute embolism and thrombosis of unspecified deep veins of unspecified lower extremity: Secondary | ICD-10-CM | POA: Diagnosis not present

## 2021-10-12 DIAGNOSIS — E1169 Type 2 diabetes mellitus with other specified complication: Secondary | ICD-10-CM | POA: Diagnosis present

## 2021-10-12 DIAGNOSIS — N2581 Secondary hyperparathyroidism of renal origin: Secondary | ICD-10-CM | POA: Diagnosis present

## 2021-10-12 DIAGNOSIS — Z992 Dependence on renal dialysis: Secondary | ICD-10-CM

## 2021-10-12 DIAGNOSIS — E1151 Type 2 diabetes mellitus with diabetic peripheral angiopathy without gangrene: Secondary | ICD-10-CM | POA: Diagnosis present

## 2021-10-12 DIAGNOSIS — K2950 Unspecified chronic gastritis without bleeding: Secondary | ICD-10-CM | POA: Diagnosis present

## 2021-10-12 DIAGNOSIS — M86171 Other acute osteomyelitis, right ankle and foot: Secondary | ICD-10-CM

## 2021-10-12 DIAGNOSIS — I272 Pulmonary hypertension, unspecified: Secondary | ICD-10-CM | POA: Diagnosis present

## 2021-10-12 DIAGNOSIS — Z7901 Long term (current) use of anticoagulants: Secondary | ICD-10-CM

## 2021-10-12 DIAGNOSIS — I10 Essential (primary) hypertension: Secondary | ICD-10-CM | POA: Diagnosis present

## 2021-10-12 DIAGNOSIS — K5730 Diverticulosis of large intestine without perforation or abscess without bleeding: Secondary | ICD-10-CM

## 2021-10-12 DIAGNOSIS — L89610 Pressure ulcer of right heel, unstageable: Secondary | ICD-10-CM | POA: Diagnosis present

## 2021-10-12 DIAGNOSIS — N186 End stage renal disease: Secondary | ICD-10-CM

## 2021-10-12 LAB — COMPREHENSIVE METABOLIC PANEL
ALT: 15 U/L (ref 0–44)
AST: 24 U/L (ref 15–41)
Albumin: 3.3 g/dL — ABNORMAL LOW (ref 3.5–5.0)
Alkaline Phosphatase: 79 U/L (ref 38–126)
Anion gap: 11 (ref 5–15)
BUN: 21 mg/dL (ref 8–23)
CO2: 32 mmol/L (ref 22–32)
Calcium: 9.1 mg/dL (ref 8.9–10.3)
Chloride: 95 mmol/L — ABNORMAL LOW (ref 98–111)
Creatinine, Ser: 5.62 mg/dL — ABNORMAL HIGH (ref 0.61–1.24)
GFR, Estimated: 10 mL/min — ABNORMAL LOW (ref >60)
Glucose, Bld: 161 mg/dL — ABNORMAL HIGH (ref 70–99)
Potassium: 4.3 mmol/L (ref 3.5–5.1)
Sodium: 138 mmol/L (ref 135–145)
Total Bilirubin: 0.5 mg/dL (ref 0.3–1.2)
Total Protein: 8.6 g/dL — ABNORMAL HIGH (ref 6.5–8.1)

## 2021-10-12 LAB — CBC WITH DIFFERENTIAL/PLATELET
Abs Immature Granulocytes: 0.04 10*3/uL (ref 0.00–0.07)
Basophils Absolute: 0 10*3/uL (ref 0.0–0.1)
Basophils Relative: 0 %
Eosinophils Absolute: 0.3 10*3/uL (ref 0.0–0.5)
Eosinophils Relative: 3 %
HCT: 20.6 % — ABNORMAL LOW (ref 39.0–52.0)
Hemoglobin: 6.6 g/dL — CL (ref 13.0–17.0)
Immature Granulocytes: 0 %
Lymphocytes Relative: 18 %
Lymphs Abs: 1.7 10*3/uL (ref 0.7–4.0)
MCH: 30.7 pg (ref 26.0–34.0)
MCHC: 32 g/dL (ref 30.0–36.0)
MCV: 95.8 fL (ref 80.0–100.0)
Monocytes Absolute: 1 10*3/uL (ref 0.1–1.0)
Monocytes Relative: 10 %
Neutro Abs: 6.3 10*3/uL (ref 1.7–7.7)
Neutrophils Relative %: 69 %
Platelets: 263 10*3/uL (ref 150–400)
RBC: 2.15 MIL/uL — ABNORMAL LOW (ref 4.22–5.81)
RDW: 19.9 % — ABNORMAL HIGH (ref 11.5–15.5)
WBC: 9.3 10*3/uL (ref 4.0–10.5)
nRBC: 0 % (ref 0.0–0.2)

## 2021-10-12 LAB — PREPARE RBC (CROSSMATCH)

## 2021-10-12 MED ORDER — SODIUM CHLORIDE 0.9 % IV SOLN: 10.0000 mL/h | Freq: Once | INTRAVENOUS | Status: DC

## 2021-10-12 NOTE — ED Provider Notes (Signed)
[facility] EMERGENCY DEPARTMENT Provider Note   CSN: [id number] Arrival date & time: 10/12/21  2157     History Chief Complaint  Patient presents with   Abnormal Lab   Foot Pain    Roger Vazquez is a 70 y.o. male.  70 year old male presents emerged from today from skilled nursing facility secondary to anemia.  Patient was states he has some right foot pain.  He has a left BKA.  He states his right foot is been hurting for the last couple months.  The doctor there is been take care of it was getting better as far as the pain goes but apparently got worse last few days.  No fevers.  Pain does not spread.  No new injuries.  He is a dialysis patient.  Has not noticed any bleeding however a month ago his hemoglobin was normal. Hemoglobin at facility was reportedly to 6.1.    Abnormal Lab Foot Pain      Past Medical History:  Diagnosis Date   Anemia    Blood dyscrasia    history of coagulation deficit per [facility] LPN nursing home   Cancer [facility])    Lung   Diabetes mellitus    Hepatitis    Hypertension    Renal disorder    MWF- [facility]    Patient Active Problem List   Diagnosis Date Noted   COVID-19 72/53/6644   Acute metabolic encephalopathy 03/47/4259   COVID-19 virus infection 08/12/2021   Peripheral vascular disease ([place]) 06/09/2021   S/P BKA (below knee amputation), left ([place]) 06/08/2021   Status post amputation of foot through metatarsal bone ([place])    Altered mental status 05/19/2021   Left foot infection    Alcohol abuse 05/16/2021   Erectile dysfunction 05/16/2021   Hydronephrosis 05/16/2021   Acute hepatitis C 05/16/2021   Diabetic retinopathy ([place]) 05/16/2021   History of hemodialysis 05/16/2021   Iron deficiency anemia 05/16/2021   Malignant neoplasm of upper lobe, left bronchus or lung ([place]) 05/16/2021   Noncompliance with medication regimen 05/16/2021   Non-small cell lung cancer ([place]) 05/16/2021   Tobacco  dependence in remission 05/16/2021   Vitreous hemorrhage, left eye ([place]) 05/16/2021   Cellulitis of left lower extremity    Gangrene ([place]) 04/27/2021   Diarrhea, unspecified 04/29/2019   Mild protein-calorie malnutrition ([place]) 08/20/2018   Fever    Infection of AV graft for dialysis ([place]) 08/05/2018   Symptomatic anemia 08/05/2018   Hypokalemia 08/05/2018   GERD (gastroesophageal reflux disease) 08/05/2018   Sepsis due to unspecified Staphylococcus ([place]) 08/02/2018   ESRD on hemodialysis ([place])    Bacteremia due to methicillin susceptible Staphylococcus aureus (MSSA) 07/28/2018   Sepsis ([place]) 07/27/2018   Headache 01/14/2017   Fluid overload, unspecified 12/13/2015   Anemia in chronic kidney disease 12/09/2015   Chronic kidney disease, stage 5 ([place]) 12/09/2015   Coagulation defect, unspecified ([place]) 12/09/2015   Encounter for adjustment and management of vascular access device 12/09/2015   Other secondary pulmonary hypertension ([place]) 12/09/2015   Pruritus, unspecified 12/09/2015   Secondary hyperparathyroidism of renal origin ([place]) 12/09/2015   Shortness of breath 12/09/2015   Abdominal pain, other specified site 10/07/2011   Nausea and vomiting 10/07/2011   VITAMIN D DEFICIENCY 03/19/2008   FRACTURE, TIBIA 02/09/2008   HTN (hypertension) 11/25/2007   HEPATITIS C 05/27/2007   Type 2 diabetes mellitus with chronic kidney disease on chronic dialysis ([place]) 05/27/2007   ERECTILE DYSFUNCTION 05/27/2007   ABUSE, ALCOHOL, IN  REMISSION 05/27/2007   ABUSE, OPIOID, IN REMISSION 05/27/2007   ABUSE, COCAINE, IN REMISSION 05/27/2007    Past Surgical History:  Procedure Laterality Date   ABDOMINAL AORTOGRAM W/LOWER EXTREMITY Left 04/28/2021   Procedure: ABDOMINAL AORTOGRAM W/LOWER EXTREMITY;  Surgeon: Cherre Robins, MD;  Location: [place] [facility];  Service: Cardiovascular;  Laterality: Left;   AMPUTATION Left 05/04/2021   Procedure: LEFT SECOND TOE AMPUTATION;  Surgeon: Cherre Robins, MD;  Location: [place];  Service: Vascular;  Laterality: Left;   AMPUTATION Left 06/08/2021   Procedure: LEFT BELOW KNEE AMPUTATION;  Surgeon: Cherre Robins, MD;  Location: [place];  Service: Vascular;  Laterality: Left;   APPLICATION OF WOUND VAC Left 06/08/2021   Procedure: APPLICATION OF WOUND VAC;  Surgeon: Cherre Robins, MD;  Location: [place];  Service: Vascular;  Laterality: Left;   artiovenous graft placement Left    AV FISTULA PLACEMENT Right 11/27/2018   Procedure: INSERTION 4-7MM X 45CM GORETEX GRAFT RIGHT FOREARM ;  Surgeon: Rosetta Posner, MD;  Location: [place];  Service: Vascular;  Laterality: Right;   [place] REMOVAL Left 08/06/2018   Procedure: REMOVAL OF LEFT ARM GRAFT;  Surgeon: Rosetta Posner, MD;  Location: [place];  Service: Vascular;  Laterality: Left;   INSERTION OF DIALYSIS CATHETER Right 08/08/2018   Procedure: INSERTION OF TUNNELED  DIALYSIS CATHETER;  Surgeon: Marty Heck, MD;  Location: [place];  Service: Vascular;  Laterality: Right;   IR DIALY SHUNT INTRO [place] W/IMG RIGHT Right 05/26/2021   IR REMOVAL TUN CV CATH W/O FL  02/19/2019   IR US GUIDE VASC ACCESS RIGHT  05/26/2021   LUNG SURGERY     patient not if they removed part of lung. Was done at [place] Left 04/28/2021   Procedure: PERIPHERAL VASCULAR BALLOON ANGIOPLASTY;  Surgeon: Cherre Robins, MD;  Location: [place] [facility];  Service: Cardiovascular;  Laterality: Left;  popliteal and posterial tibial   TEE WITHOUT CARDIOVERSION N/A 07/31/2018   Procedure: TRANSESOPHAGEAL ECHOCARDIOGRAM (TEE);  Surgeon: Pixie Casino, MD;  Location: [facility] ENDOSCOPY;  Service: Cardiovascular;  Laterality: N/A;   TRANSMETATARSAL AMPUTATION Left 05/22/2021   Procedure: LEFT TRANSMETATARSAL AMPUTATION;  Surgeon: Elam Dutch, MD;  Location: [place];  Service: Vascular;  Laterality: Left;       History reviewed. No pertinent family history.  Social  History   Tobacco Use   Smoking status: Former    Packs/day: 0.50    Years: 10.00    Pack years: 5.00    Types: Cigarettes    Quit date: 2001    Years since quitting: 22.0   Smokeless tobacco: Former    Quit date: 08/01/2015  Vaping Use   Vaping Use: Never used  Substance Use Topics   Alcohol use: Yes    Alcohol/week: 2.0 standard drinks    Types: 2 Cans of beer per week   Drug use: Yes    Types: Marijuana    Comment: last time 11/19/2018    Home Medications Prior to Admission medications   Medication Sig Start Date End Date Taking? Authorizing Provider  acetaminophen (TYLENOL) 325 MG tablet Take 650 mg by mouth every 6 (six) hours.    [provider]  amLODipine (NORVASC) 10 MG tablet Take 1 tablet (10 mg total) by mouth daily. 08/16/21   Ghimire, Henreitta Leber, MD  apixaban (ELIQUIS) 5 MG TABS tablet Take 2 tablets (10 mg total) by mouth  2 (two) times daily for 7 doses. 08/15/21 08/19/21  Ghimire, Henreitta Leber, MD  apixaban (ELIQUIS) 5 MG TABS tablet Take 1 tablet (5 mg total) by mouth 2 (two) times daily. 08/19/21   Ghimire, Henreitta Leber, MD  aspirin 81 MG chewable tablet Chew 81 mg by mouth daily. (0800)    [provider]  calcium acetate (PHOSLO) 667 MG capsule Take 667 mg by mouth 3 (three) times daily with meals. (0800, 1300 & 1700)    [provider]  ferrous sulfate 325 (65 FE) MG tablet Take 325 mg by mouth daily with breakfast.    [provider]  hydrocortisone (ANUSOL-HC) 25 MG suppository Place 25 mg rectally 2 (two) times daily as needed for hemorrhoids or anal itching. Patient not taking: Reported on 10/10/2021    [provider]  lidocaine-prilocaine (EMLA) cream Apply 1 application topically See admin instructions. As needed on dialysis days.Laurine Blazer and Friday) 04/28/21   [provider]  Multiple Vitamin (MULITIVITAMIN WITH MINERALS) TABS Take 1 tablet by mouth daily. 10/12/11   Robbie Lis, MD  Nutritional  Supplements (NOVASOURCE RENAL PO) Take 237 mLs by mouth daily.    [provider]  polyethylene glycol (MIRALAX / GLYCOLAX) 17 g packet Take 17 g by mouth 2 (two) times daily. 05/05/21   Ezequiel Essex, MD  senna (SENOKOT) 8.6 MG TABS tablet Take 1 tablet (8.6 mg total) by mouth daily. 05/05/21   Ezequiel Essex, MD    Allergies    Sildenafil  Review of Systems   Review of Systems  All other systems reviewed and are negative.  Physical Exam Updated Vital Signs BP (!) 141/75    Pulse 84    Temp 98.2 F (36.8 C) (Oral)    Resp 11    Ht 5\' 6"  (1.676 m)    Wt 55 kg    SpO2 95%    BMI 19.57 kg/m   Physical Exam Vitals and nursing note reviewed.  Constitutional:      Appearance: He is well-developed.  HENT:     Head: Normocephalic and atraumatic.     Mouth/Throat:     Mouth: Mucous membranes are moist.     Pharynx: Oropharynx is clear.  Eyes:     Pupils: Pupils are equal, round, and reactive to light.  Cardiovascular:     Rate and Rhythm: Normal rate.  Pulmonary:     Effort: Pulmonary effort is normal. No respiratory distress.  Abdominal:     General: Abdomen is flat. There is no distension.  Musculoskeletal:        General: Normal range of motion.     Cervical back: Normal range of motion.     Comments: Left BKA  Right foot with a dark ulcer over whole heel with dressing in place. No surroundign erythema or warmth. No drainage.   Skin:    General: Skin is warm and dry.  Neurological:     General: No focal deficit present.     Mental Status: He is alert.    ED Results / Procedures / Treatments   Labs (all labs ordered are listed, but only abnormal results are displayed) Labs Reviewed  CBC WITH DIFFERENTIAL/PLATELET - Abnormal; Notable for the following components:      Result Value   RBC 2.15 (*)    Hemoglobin 6.6 (*)    HCT 20.6 (*)    RDW 19.9 (*)    All other components within normal limits  COMPREHENSIVE METABOLIC PANEL - Abnormal;  Notable for the  following components:   Chloride 95 (*)    Glucose, Bld 161 (*)    Creatinine, Ser 5.62 (*)    Total Protein 8.6 (*)    Albumin 3.3 (*)    GFR, Estimated 10 (*)    All other components within normal limits  POC OCCULT BLOOD, ED  TYPE AND SCREEN  PREPARE RBC (CROSSMATCH)    EKG None  Radiology No results found.  Procedures Procedures   Medications Ordered in ED Medications  0.9 %  sodium chloride infusion (has no administration in time range)    ED Course  I have reviewed the triage vital signs and the nursing notes.  Pertinent labs & imaging results that were available during my care of the patient were reviewed by me and considered in my medical decision making (see chart for details).    MDM Rules/Calculators/A&P                         Will need hmoccult to assess causes for anemia. Will transfuse. Will xr foot to eval for significant osteo but ultimately I think it can be continued to be cared for as an outpatient.   Hemoccult negative.  His hemoglobin is normal a month ago so I am not sure where the anemia is coming from.  Anemia panel added on.  He has significant antibodies to blood so we will go and admit him for transfusion and to get dialysis.  I discussed with hospitalist and nephrology for the same.  Also put in a note for gastroenterology to see for presumed GI bleed   Final Clinical Impression(s) / ED Diagnoses Final diagnoses:  None    Rx / DC Orders ED Discharge Orders     None        Amarien Carne, Corene Cornea, MD 10/14/21 [phone]

## 2021-10-12 NOTE — ED Triage Notes (Signed)
Pt BIB [facility] from [facility] for eval of low hemoglobin. Pt reportedly had labs done on 12/26, and the facility called 911 today to have pt transported out of hemoglobin of 6.4. Pt is a dialysis pt, M/W/F, went to dialysis yesterday. Missed some sessions last week

## 2021-10-13 ENCOUNTER — Encounter (HOSPITAL_COMMUNITY): Payer: Self-pay | Admitting: Family Medicine

## 2021-10-13 ENCOUNTER — Inpatient Hospital Stay (HOSPITAL_COMMUNITY): Payer: No Typology Code available for payment source

## 2021-10-13 DIAGNOSIS — I825Y9 Chronic embolism and thrombosis of unspecified deep veins of unspecified proximal lower extremity: Secondary | ICD-10-CM | POA: Diagnosis not present

## 2021-10-13 DIAGNOSIS — K5730 Diverticulosis of large intestine without perforation or abscess without bleeding: Secondary | ICD-10-CM | POA: Diagnosis present

## 2021-10-13 DIAGNOSIS — K254 Chronic or unspecified gastric ulcer with hemorrhage: Secondary | ICD-10-CM | POA: Diagnosis present

## 2021-10-13 DIAGNOSIS — D509 Iron deficiency anemia, unspecified: Secondary | ICD-10-CM | POA: Diagnosis not present

## 2021-10-13 DIAGNOSIS — I120 Hypertensive chronic kidney disease with stage 5 chronic kidney disease or end stage renal disease: Secondary | ICD-10-CM | POA: Diagnosis present

## 2021-10-13 DIAGNOSIS — Z992 Dependence on renal dialysis: Secondary | ICD-10-CM | POA: Diagnosis not present

## 2021-10-13 DIAGNOSIS — Z20822 Contact with and (suspected) exposure to covid-19: Secondary | ICD-10-CM | POA: Diagnosis present

## 2021-10-13 DIAGNOSIS — M898X9 Other specified disorders of bone, unspecified site: Secondary | ICD-10-CM | POA: Diagnosis present

## 2021-10-13 DIAGNOSIS — R591 Generalized enlarged lymph nodes: Secondary | ICD-10-CM

## 2021-10-13 DIAGNOSIS — D649 Anemia, unspecified: Secondary | ICD-10-CM | POA: Diagnosis present

## 2021-10-13 DIAGNOSIS — L89610 Pressure ulcer of right heel, unstageable: Secondary | ICD-10-CM | POA: Diagnosis present

## 2021-10-13 DIAGNOSIS — Z7901 Long term (current) use of anticoagulants: Secondary | ICD-10-CM | POA: Diagnosis not present

## 2021-10-13 DIAGNOSIS — D631 Anemia in chronic kidney disease: Secondary | ICD-10-CM | POA: Diagnosis present

## 2021-10-13 DIAGNOSIS — I82409 Acute embolism and thrombosis of unspecified deep veins of unspecified lower extremity: Secondary | ICD-10-CM | POA: Diagnosis not present

## 2021-10-13 DIAGNOSIS — E1122 Type 2 diabetes mellitus with diabetic chronic kidney disease: Secondary | ICD-10-CM | POA: Diagnosis present

## 2021-10-13 DIAGNOSIS — E1169 Type 2 diabetes mellitus with other specified complication: Secondary | ICD-10-CM | POA: Diagnosis present

## 2021-10-13 DIAGNOSIS — K253 Acute gastric ulcer without hemorrhage or perforation: Secondary | ICD-10-CM | POA: Diagnosis not present

## 2021-10-13 DIAGNOSIS — Z8616 Personal history of COVID-19: Secondary | ICD-10-CM | POA: Diagnosis not present

## 2021-10-13 DIAGNOSIS — I70235 Atherosclerosis of native arteries of right leg with ulceration of other part of foot: Secondary | ICD-10-CM | POA: Diagnosis not present

## 2021-10-13 DIAGNOSIS — L97419 Non-pressure chronic ulcer of right heel and midfoot with unspecified severity: Secondary | ICD-10-CM | POA: Diagnosis present

## 2021-10-13 DIAGNOSIS — I82402 Acute embolism and thrombosis of unspecified deep veins of left lower extremity: Secondary | ICD-10-CM | POA: Diagnosis not present

## 2021-10-13 DIAGNOSIS — K259 Gastric ulcer, unspecified as acute or chronic, without hemorrhage or perforation: Secondary | ICD-10-CM | POA: Diagnosis not present

## 2021-10-13 DIAGNOSIS — K219 Gastro-esophageal reflux disease without esophagitis: Secondary | ICD-10-CM | POA: Diagnosis present

## 2021-10-13 DIAGNOSIS — N186 End stage renal disease: Secondary | ICD-10-CM | POA: Diagnosis present

## 2021-10-13 DIAGNOSIS — Z87891 Personal history of nicotine dependence: Secondary | ICD-10-CM | POA: Diagnosis not present

## 2021-10-13 DIAGNOSIS — N2581 Secondary hyperparathyroidism of renal origin: Secondary | ICD-10-CM | POA: Diagnosis present

## 2021-10-13 DIAGNOSIS — K2950 Unspecified chronic gastritis without bleeding: Secondary | ICD-10-CM | POA: Diagnosis present

## 2021-10-13 DIAGNOSIS — I272 Pulmonary hypertension, unspecified: Secondary | ICD-10-CM | POA: Diagnosis present

## 2021-10-13 DIAGNOSIS — L97519 Non-pressure chronic ulcer of other part of right foot with unspecified severity: Secondary | ICD-10-CM | POA: Diagnosis not present

## 2021-10-13 DIAGNOSIS — M86171 Other acute osteomyelitis, right ankle and foot: Secondary | ICD-10-CM | POA: Diagnosis present

## 2021-10-13 DIAGNOSIS — E1151 Type 2 diabetes mellitus with diabetic peripheral angiopathy without gangrene: Secondary | ICD-10-CM | POA: Diagnosis present

## 2021-10-13 DIAGNOSIS — D62 Acute posthemorrhagic anemia: Secondary | ICD-10-CM | POA: Diagnosis present

## 2021-10-13 DIAGNOSIS — I10 Essential (primary) hypertension: Secondary | ICD-10-CM | POA: Diagnosis not present

## 2021-10-13 DIAGNOSIS — N3090 Cystitis, unspecified without hematuria: Secondary | ICD-10-CM | POA: Diagnosis present

## 2021-10-13 DIAGNOSIS — Z89512 Acquired absence of left leg below knee: Secondary | ICD-10-CM | POA: Diagnosis not present

## 2021-10-13 LAB — CBC
HCT: 22.5 % — ABNORMAL LOW (ref 39.0–52.0)
Hemoglobin: 7.4 g/dL — ABNORMAL LOW (ref 13.0–17.0)
MCH: 30.6 pg (ref 26.0–34.0)
MCHC: 32.9 g/dL (ref 30.0–36.0)
MCV: 93 fL (ref 80.0–100.0)
Platelets: 234 10*3/uL (ref 150–400)
RBC: 2.42 MIL/uL — ABNORMAL LOW (ref 4.22–5.81)
RDW: 18.8 % — ABNORMAL HIGH (ref 11.5–15.5)
WBC: 9.5 10*3/uL (ref 4.0–10.5)
nRBC: 0 % (ref 0.0–0.2)

## 2021-10-13 LAB — C-REACTIVE PROTEIN: CRP: 4.7 mg/dL — ABNORMAL HIGH (ref <1.0)

## 2021-10-13 LAB — RETICULOCYTES
Immature Retic Fract: 23.7 % — ABNORMAL HIGH (ref 2.3–15.9)
RBC.: 2.54 MIL/uL — ABNORMAL LOW (ref 4.22–5.81)
Retic Count, Absolute: 47.2 10*3/uL (ref 19.0–186.0)
Retic Ct Pct: 1.9 % (ref 0.4–3.1)

## 2021-10-13 LAB — GLUCOSE, CAPILLARY
Glucose-Capillary: 132 mg/dL — ABNORMAL HIGH (ref 70–99)
Glucose-Capillary: 176 mg/dL — ABNORMAL HIGH (ref 70–99)

## 2021-10-13 LAB — VITAMIN B12: Vitamin B-12: 700 pg/mL (ref 180–914)

## 2021-10-13 LAB — PREALBUMIN: Prealbumin: 22.6 mg/dL (ref 18–38)

## 2021-10-13 LAB — IRON AND TIBC
Iron: 67 ug/dL (ref 45–182)
Saturation Ratios: 31 % (ref 17.9–39.5)
TIBC: 217 ug/dL — ABNORMAL LOW (ref 250–450)
UIBC: 150 ug/dL

## 2021-10-13 LAB — HEPATITIS B SURFACE ANTIGEN: Hepatitis B Surface Ag: NONREACTIVE

## 2021-10-13 LAB — PROTIME-INR
INR: 1.1 (ref 0.8–1.2)
Prothrombin Time: 14.5 seconds (ref 11.4–15.2)

## 2021-10-13 LAB — SEDIMENTATION RATE: Sed Rate: 112 mm/hr — ABNORMAL HIGH (ref 0–16)

## 2021-10-13 LAB — RESP PANEL BY RT-PCR (FLU A&B, COVID) ARPGX2
Influenza A by PCR: NEGATIVE
Influenza B by PCR: NEGATIVE
SARS Coronavirus 2 by RT PCR: NEGATIVE

## 2021-10-13 LAB — HEPATITIS B SURFACE ANTIBODY,QUALITATIVE: Hep B S Ab: REACTIVE — AB

## 2021-10-13 LAB — FOLATE: Folate: 21.8 ng/mL (ref >5.9)

## 2021-10-13 LAB — CBG MONITORING, ED: Glucose-Capillary: 129 mg/dL — ABNORMAL HIGH (ref 70–99)

## 2021-10-13 LAB — POC OCCULT BLOOD, ED: Fecal Occult Bld: NEGATIVE

## 2021-10-13 LAB — FERRITIN: Ferritin: 860 ng/mL — ABNORMAL HIGH (ref 24–336)

## 2021-10-13 MED ORDER — PANTOPRAZOLE SODIUM 40 MG PO TBEC
40.0000 mg | DELAYED_RELEASE_TABLET | Freq: Every day | ORAL | Status: DC
  Administered 2021-10-14: 40 mg via ORAL
  Filled 2021-10-13: qty 1

## 2021-10-13 MED ORDER — ACETAMINOPHEN 650 MG RE SUPP: 650.0000 mg | Freq: Four times a day (QID) | RECTAL | Status: DC | PRN

## 2021-10-13 MED ORDER — DOCUSATE SODIUM 283 MG RE ENEM
1.0000 | ENEMA | RECTAL | Status: DC | PRN
  Filled 2021-10-13: qty 1

## 2021-10-13 MED ORDER — CAMPHOR-MENTHOL 0.5-0.5 % EX LOTN
1.0000 "application " | TOPICAL_LOTION | Freq: Three times a day (TID) | CUTANEOUS | Status: DC | PRN
  Filled 2021-10-13: qty 222

## 2021-10-13 MED ORDER — ONDANSETRON HCL 4 MG PO TABS: 4.0000 mg | ORAL_TABLET | Freq: Four times a day (QID) | ORAL | Status: DC | PRN

## 2021-10-13 MED ORDER — POLYETHYLENE GLYCOL 3350 17 G PO PACK
17.0000 g | PACK | Freq: Two times a day (BID) | ORAL | Status: DC
  Administered 2021-10-15 – 2021-10-22 (×6): 17 g via ORAL
  Filled 2021-10-13 (×16): qty 1

## 2021-10-13 MED ORDER — ALBUTEROL SULFATE (2.5 MG/3ML) 0.083% IN NEBU: 2.5000 mg | INHALATION_SOLUTION | RESPIRATORY_TRACT | Status: DC | PRN

## 2021-10-13 MED ORDER — FERROUS SULFATE 325 (65 FE) MG PO TABS
325.0000 mg | ORAL_TABLET | Freq: Every day | ORAL | Status: DC
  Administered 2021-10-14 – 2021-10-24 (×9): 325 mg via ORAL
  Filled 2021-10-13 (×10): qty 1

## 2021-10-13 MED ORDER — NEPRO/CARBSTEADY PO LIQD
237.0000 mL | Freq: Three times a day (TID) | ORAL | Status: DC | PRN
  Filled 2021-10-13: qty 237

## 2021-10-13 MED ORDER — AMLODIPINE BESYLATE 10 MG PO TABS
10.0000 mg | ORAL_TABLET | Freq: Every day | ORAL | Status: DC
  Administered 2021-10-13 – 2021-10-24 (×10): 10 mg via ORAL
  Filled 2021-10-13 (×3): qty 1
  Filled 2021-10-13: qty 2
  Filled 2021-10-13 (×6): qty 1

## 2021-10-13 MED ORDER — ACETAMINOPHEN 325 MG PO TABS
650.0000 mg | ORAL_TABLET | Freq: Four times a day (QID) | ORAL | Status: DC | PRN
  Administered 2021-10-13 – 2021-10-21 (×4): 650 mg via ORAL
  Filled 2021-10-13 (×4): qty 2

## 2021-10-13 MED ORDER — HEPARIN SODIUM (PORCINE) 1000 UNIT/ML IJ SOLN
INTRAMUSCULAR | Status: AC
  Administered 2021-10-13: 1000 [IU]
  Filled 2021-10-13: qty 4

## 2021-10-13 MED ORDER — ONDANSETRON HCL 4 MG/2ML IJ SOLN: 4.0000 mg | Freq: Four times a day (QID) | INTRAMUSCULAR | Status: DC | PRN

## 2021-10-13 MED ORDER — ZOLPIDEM TARTRATE 5 MG PO TABS
5.0000 mg | ORAL_TABLET | Freq: Every evening | ORAL | Status: DC | PRN
  Administered 2021-10-13 – 2021-10-21 (×4): 5 mg via ORAL
  Filled 2021-10-13 (×4): qty 1

## 2021-10-13 MED ORDER — CALCIUM CARBONATE ANTACID 1250 MG/5ML PO SUSP
500.0000 mg | Freq: Four times a day (QID) | ORAL | Status: DC | PRN
  Filled 2021-10-13: qty 5

## 2021-10-13 MED ORDER — ADULT MULTIVITAMIN W/MINERALS CH
1.0000 | ORAL_TABLET | Freq: Every day | ORAL | Status: DC
  Administered 2021-10-13 – 2021-10-14 (×2): 1 via ORAL
  Filled 2021-10-13 (×2): qty 1

## 2021-10-13 MED ORDER — HYDRALAZINE HCL 20 MG/ML IJ SOLN: 5.0000 mg | INTRAMUSCULAR | Status: DC | PRN

## 2021-10-13 MED ORDER — INSULIN ASPART 100 UNIT/ML IJ SOLN
0.0000 [IU] | Freq: Three times a day (TID) | INTRAMUSCULAR | Status: DC
Start: 2021-10-13 — End: 2021-10-25
  Administered 2021-10-14: 1 [IU] via SUBCUTANEOUS
  Administered 2021-10-15: 2 [IU] via SUBCUTANEOUS
  Administered 2021-10-16 – 2021-10-22 (×8): 1 [IU] via SUBCUTANEOUS
  Administered 2021-10-23: 2 [IU] via SUBCUTANEOUS
  Administered 2021-10-23: 1 [IU] via SUBCUTANEOUS
  Administered 2021-10-24: 3 [IU] via SUBCUTANEOUS
  Administered 2021-10-24 (×2): 1 [IU] via SUBCUTANEOUS

## 2021-10-13 MED ORDER — DARBEPOETIN ALFA 100 MCG/0.5ML IJ SOSY: 100.0000 ug | PREFILLED_SYRINGE | INTRAMUSCULAR | Status: DC

## 2021-10-13 MED ORDER — HYDROXYZINE HCL 25 MG PO TABS
25.0000 mg | ORAL_TABLET | Freq: Three times a day (TID) | ORAL | Status: DC | PRN
  Administered 2021-10-17: 25 mg via ORAL
  Filled 2021-10-13 (×3): qty 1

## 2021-10-13 MED ORDER — CHLORHEXIDINE GLUCONATE CLOTH 2 % EX PADS
6.0000 | MEDICATED_PAD | Freq: Every day | CUTANEOUS | Status: DC
  Administered 2021-10-14 – 2021-10-15 (×2): 6 via TOPICAL

## 2021-10-13 MED ORDER — NOVASOURCE RENAL PO LIQD: Freq: Every day | ORAL | Status: DC

## 2021-10-13 MED ORDER — OXYCODONE HCL 5 MG PO TABS
5.0000 mg | ORAL_TABLET | Freq: Two times a day (BID) | ORAL | Status: DC | PRN
  Administered 2021-10-13 – 2021-10-16 (×6): 5 mg via ORAL
  Filled 2021-10-13 (×6): qty 1

## 2021-10-13 MED ORDER — SORBITOL 70 % SOLN
30.0000 mL | Status: DC | PRN
  Filled 2021-10-13: qty 30

## 2021-10-13 MED ORDER — CALCIUM ACETATE (PHOS BINDER) 667 MG PO CAPS
667.0000 mg | ORAL_CAPSULE | Freq: Three times a day (TID) | ORAL | Status: DC
  Administered 2021-10-13 – 2021-10-24 (×24): 667 mg via ORAL
  Filled 2021-10-13 (×25): qty 1

## 2021-10-13 MED ORDER — SENNA 8.6 MG PO TABS
1.0000 | ORAL_TABLET | Freq: Every day | ORAL | Status: DC
  Administered 2021-10-13 – 2021-10-24 (×8): 8.6 mg via ORAL
  Filled 2021-10-13 (×9): qty 1

## 2021-10-13 MED ORDER — SODIUM CHLORIDE 0.9% FLUSH
3.0000 mL | Freq: Two times a day (BID) | INTRAVENOUS | Status: DC
  Administered 2021-10-13 – 2021-10-23 (×14): 3 mL via INTRAVENOUS

## 2021-10-13 MED ORDER — DARBEPOETIN ALFA 100 MCG/0.5ML IJ SOSY
PREFILLED_SYRINGE | INTRAMUSCULAR | Status: AC
  Administered 2021-10-13: 100 ug via INTRAVENOUS
  Filled 2021-10-13: qty 0.5

## 2021-10-13 NOTE — Progress Notes (Signed)
Patient ID: ARSHIA RONDON, male   DOB: [birth year], 70 y.o.   MRN: [id number] Paged by EDP to inform me that Mr. Ahlgren was being admitted to the hospitalist service for management of symptomatic/unexplained anemia.  He has end-stage renal disease and is on a MWF hemodialysis schedule with concerns arising for volume overload/electrolyte abnormalities with planned PRBC transfusion.  I have ordered for hemodialysis and a formal consult will follow later today by the renal service.  Elmarie Shiley MD [facility]. Office # [phone] Pager # [phone] 6:15 AM

## 2021-10-13 NOTE — Assessment & Plan Note (Addendum)
-  Patient with Hgb 12.6 on 11/2 and 6.6 g/dL on admission - s/p EGD (small gastric ulcer) and CLN (diverticulosis) - indefinite PPI daily for gastric ulcer. No repeat CLN needed for surveillance/screening purposes  - Hgb downtrend to 7 g/dL today; likely multifactorial with recent surgery as well.  - transfuse 1 unit PRBC with HD today - repeat CBC in am

## 2021-10-13 NOTE — Assessment & Plan Note (Addendum)
-  Duplex (10/16/21) notes chronic DVT of SF junction -Has been on Eliquis -Once hemoglobin is stable postop for at least 24 hours, can discontinue heparin drip and resume Eliquis then will be ready for discharge

## 2021-10-13 NOTE — Assessment & Plan Note (Addendum)
-  Prior A1c was 7.9% on 04/27/21 - repeat A1c now 6.6% on 10/18/21 - continue SSI and CBG monitoring

## 2021-10-13 NOTE — Assessment & Plan Note (Deleted)
-  Patient with known heel ulcer, has been followed by vascular -He had failed ABIs recently with inadequate perfusion to promote wound healing -No current concerns for sepsis -Will hold antibiotics for now -Will order MRI as this does not appear to have been done prior -LE wound order set utilized including labs (CRP, ESR, A1c, prealbumin, HIV, and blood cultures) and consults (diabetes coordinator; peripheral vascular navigator; toc team; wound care; and nutrition) -Of note, he is already s/p L BKA

## 2021-10-13 NOTE — Assessment & Plan Note (Signed)
-  Continue Norvasc -Will cover with prn IV hydralazine

## 2021-10-13 NOTE — Consult Note (Addendum)
[place] [facility] Nurse Consult Note: Reason for Consult: Consult requested for right heel.  Performed remotely after review of progress notes and photo in the EMR.  Wound type: Unstageable pressure injury to right heel Pressure Injury POA: Yes 100% dry eschar.  Pt has been followed by Dr Cameron Proud of the vascular team at some point on 12/27; he ordered an ABI on 12/27 which indicated inadequate perfusion to promote wound healing: .67.  Progress notes indicate: "Plan will be for aortogram with right lower extremity runoff and possible intervention by Dr. Stanford Breed in the next 1 to 2 weeks." Dressing procedure/placement/frequency: It is best practice to leave dry stable eschar in place to heel wounds.  Float heels to reduce pressure.  Topical treatment orders provided for bedside nurses to perform as follows to protect from further injury: Foam dressing to right heel, change Q 3 days or PRN soiling. If wound develops odor, drainage, or fluctuance, please consult vascular team for further orders. Please re-consult if further assistance is needed.  Thank-you,  Julien Girt MSN, [place], [place], [place], [place]

## 2021-10-13 NOTE — H&P (Signed)
History and Physical    Patient: Roger Vazquez ZDG:[id number] DOB: 25-Dec-1950 DOA: 10/12/2021 DOS: the patient was seen and examined on 10/13/2021 PCP: [facility]  Patient coming from: SNF - [facility] for rehab, has been there about 3 months; NOK: Roger, Vazquez, [phone]  Chief Complaint: Symptomatic anemia  HPI: Roger JUSTINIANO is a 70 y.o. male with medical history significant of ESRD on MWF HD; PAD s/p left BKA; DM; and HTN presenting with Hgb 6.4 on labs done at [facility].  He reports that his hemoglobin was low on blood draw at the facility.  He had been feeling weak/tired.  He wears Pampers and can't see his stools so is uncertain if he has had any change.  He had diarrhea when he had "the virus" maybe a month ago.  He is not ambulatory at this time.  He does feel SOB intermittently in the last few weeks.  No CP.  Last HD was Wednesday  No n/v.  No abdominal pain.  Has had been having nosebleeds - it started last week and comes and goes.    ED Course:  Carryover, per Dr. Tonie Griffith:  70 yo male with hx of ESRD on HD from SNF for weakness. Found to have hgb of 6.6. PRBC ordered.  Hemoccult negative x 1 in ER.  Hgb a month ago was 12.  GI and nephrology consulted by ER physician   Review of Systems: ROS reviewed and negative except as above Past Medical History:  Diagnosis Date   Anemia    Blood dyscrasia    history of coagulation deficit per [facility] L[facility] nursing home   Cancer [facility])    Lung   Diabetes mellitus    Hepatitis    Hypertension    Renal disorder    MWF- [facility]   Past Surgical History:  Procedure Laterality Date   ABDOMINAL AORTOGRAM W/LOWER EXTREMITY Left 04/28/2021   Procedure: ABDOMINAL AORTOGRAM W/LOWER EXTREMITY;  Surgeon: Cherre Robins, MD;  Location: [place] [facility];  Service: Cardiovascular;  Laterality: Left;   AMPUTATION Left 05/04/2021   Procedure: LEFT SECOND TOE AMPUTATION;  Surgeon: Cherre Robins, MD;   Location: [place];  Service: Vascular;  Laterality: Left;   AMPUTATION Left 06/08/2021   Procedure: LEFT BELOW KNEE AMPUTATION;  Surgeon: Cherre Robins, MD;  Location: [place];  Service: Vascular;  Laterality: Left;   APPLICATION OF WOUND VAC Left 06/08/2021   Procedure: APPLICATION OF WOUND VAC;  Surgeon: Cherre Robins, MD;  Location: [place];  Service: Vascular;  Laterality: Left;   artiovenous graft placement Left    AV FISTULA PLACEMENT Right 11/27/2018   Procedure: INSERTION 4-7MM X 45CM GORETEX GRAFT RIGHT FOREARM ;  Surgeon: Rosetta Posner, MD;  Location: [place];  Service: Vascular;  Laterality: Right;   [place] REMOVAL Left 08/06/2018   Procedure: REMOVAL OF LEFT ARM GRAFT;  Surgeon: Rosetta Posner, MD;  Location: [place];  Service: Vascular;  Laterality: Left;   INSERTION OF DIALYSIS CATHETER Right 08/08/2018   Procedure: INSERTION OF TUNNELED  DIALYSIS CATHETER;  Surgeon: Marty Heck, MD;  Location: [place];  Service: Vascular;  Laterality: Right;   IR DIALY SHUNT INTRO [place] W/IMG RIGHT Right 05/26/2021   IR REMOVAL TUN CV CATH W/O FL  02/19/2019   IR US GUIDE VASC ACCESS RIGHT  05/26/2021   LUNG SURGERY     patient not if they removed part of lung. Was done at [facility]  PERIPHERAL VASCULAR BALLOON ANGIOPLASTY Left 04/28/2021   Procedure: PERIPHERAL VASCULAR BALLOON ANGIOPLASTY;  Surgeon: Cherre Robins, MD;  Location: [place] [facility];  Service: Cardiovascular;  Laterality: Left;  popliteal and posterial tibial   TEE WITHOUT CARDIOVERSION N/A 07/31/2018   Procedure: TRANSESOPHAGEAL ECHOCARDIOGRAM (TEE);  Surgeon: Pixie Casino, MD;  Location: [facility] ENDOSCOPY;  Service: Cardiovascular;  Laterality: N/A;   TRANSMETATARSAL AMPUTATION Left 05/22/2021   Procedure: LEFT TRANSMETATARSAL AMPUTATION;  Surgeon: Elam Dutch, MD;  Location: [facility] OR;  Service: Vascular;  Laterality: Left;   Social History:  reports that he quit smoking about 22 years ago. His smoking use  included cigarettes. He has a 5.00 pack-year smoking history. He quit smokeless tobacco use about 6 years ago. He reports that he does not currently use alcohol after a past usage of about 2.0 standard drinks per week. He reports that he does not currently use drugs after having used the following drugs: Marijuana.  Allergies  Allergen Reactions   Sildenafil Other (See Comments)    Pt stated, "Makes my Blood Pressure drop"    History reviewed. No pertinent family history.  Prior to Admission medications   Medication Sig Start Date End Date Taking? Authorizing Provider  acetaminophen (TYLENOL) 325 MG tablet Take 650 mg by mouth every 6 (six) hours.    [provider]  amLODipine (NORVASC) 10 MG tablet Take 1 tablet (10 mg total) by mouth daily. 08/16/21   Ghimire, Henreitta Leber, MD  apixaban (ELIQUIS) 5 MG TABS tablet Take 2 tablets (10 mg total) by mouth 2 (two) times daily for 7 doses. 08/15/21 08/19/21  Ghimire, Henreitta Leber, MD  apixaban (ELIQUIS) 5 MG TABS tablet Take 1 tablet (5 mg total) by mouth 2 (two) times daily. 08/19/21   Ghimire, Henreitta Leber, MD  aspirin 81 MG chewable tablet Chew 81 mg by mouth daily. (0800)    [provider]  calcium acetate (PHOSLO) 667 MG capsule Take 667 mg by mouth 3 (three) times daily with meals. (0800, 1300 & 1700)    [provider]  ferrous sulfate 325 (65 FE) MG tablet Take 325 mg by mouth daily with breakfast.    [provider]  hydrocortisone (ANUSOL-HC) 25 MG suppository Place 25 mg rectally 2 (two) times daily as needed for hemorrhoids or anal itching. Patient not taking: Reported on 10/10/2021    [provider]  lidocaine-prilocaine (EMLA) cream Apply 1 application topically See admin instructions. As needed on dialysis days.Laurine Blazer and Friday) 04/28/21   [provider]  Multiple Vitamin (MULITIVITAMIN WITH MINERALS) TABS Take 1 tablet by mouth daily. 10/12/11   Robbie Lis, MD  Nutritional  Supplements (NOVASOURCE RENAL PO) Take 237 mLs by mouth daily.    [provider]  polyethylene glycol (MIRALAX / GLYCOLAX) 17 g packet Take 17 g by mouth 2 (two) times daily. 05/05/21   Ezequiel Essex, MD  senna (SENOKOT) 8.6 MG TABS tablet Take 1 tablet (8.6 mg total) by mouth daily. 05/05/21   Ezequiel Essex, MD    Physical Exam: Vitals:   10/13/21 1610 10/13/21 1630 10/13/21 1700 10/13/21 1730  BP: (!) 150/78 (!) 149/71 (!) 126/42 122/67  Pulse: 85 80 78 77  Resp:      Temp: 98 F (36.7 C)     TempSrc: Oral     SpO2: 98%     Weight: 54.4 kg     Height:       General:  Appears calm and comfortable and  is in NAD Eyes:  PERRL, EOMI, normal lids, iris ENT:  grossly normal hearing, lips & tongue, mmm; edentulous Neck:  no LAD, masses or thyromegaly Cardiovascular:  RRR, no m/r/g. No LE edema.  Respiratory:   CTA bilaterally with no wheezes/rales/rhonchi.  Normal respiratory effort. Abdomen:  soft, NT, ND Skin:  Wound on R heel    Musculoskeletal:   L BKA; wound on R heel Psychiatric:  grossly normal mood and affect, speech fluent and appropriate, AOx3 Neurologic:  CN 2-12 grossly intact  Radiological Exams on Admission: Independently reviewed - see discussion in A/P where applicable  DG Foot Complete Right  Result Date: 10/12/2021 CLINICAL DATA:  Right foot pain EXAM: RIGHT FOOT COMPLETE - 3+ VIEW COMPARISON:  None. FINDINGS: Diffuse bone demineralization. No evidence of acute fracture or dislocation. No focal bone lesion or bone destruction. No radiographic evidence of osteomyelitis. Prominent vascular calcifications. Postoperative intramedullary rod in the tibia. Incompletely visualized. No soft tissue gas or foreign body. IMPRESSION: Diffuse bone demineralization. No acute bony abnormalities. Prominent vascular calcifications. Electronically Signed   By: Lucienne Capers M.D.   On: 10/12/2021 23:51    EKG: not done   Labs on Admission: I have personally reviewed  the available labs and imaging studies at the time of the admission.  Pertinent labs:    Glucose 161 BUN 21/Creatinine 5.62/GFR 10 Hgb 6.6; 12.6 on 11/2 COVID/flu negative Heme negative   Assessment/Plan * Symptomatic anemia- (present on admission) -Patient with Hgb 12.6 on 11/2 and now 6.6 -He does not see his stools and so has no idea if he has had recent bleeding -He does c/o epistaxis but none currently and that would be substantial blood loss from epistaxis -He may have retroperitoneal hemorrhage and so needs CT evaluation -Hgb 6.6 and he was transfused 1 unit PRBC; will hold additional units, check CBC q12h, and transfuse for Hgb <7 -Will admit to telemetry -GI was consulted   DVT (deep venous thrombosis) ([place]) -Patient with reported prior h/o DVT -Has been on Eliquis -Hold Eliquis for now  Heel ulcer, right, with unspecified severity ([place])- (present on admission) -Patient with known heel ulcer, has been followed by vascular -He had failed ABIs recently with inadequate perfusion to promote wound healing -No current concerns for sepsis -Will hold antibiotics for now -Will order MRI as this does not appear to have been done prior -LE wound order set utilized including labs (CRP, ESR, A1c, prealbumin, HIV, and blood cultures) and consults (diabetes coordinator; peripheral vascular navigator; toc team; wound care; and nutrition) -Of note, he is already s/p L BKA   ESRD on hemodialysis ([place]) -Patient on chronic MWF HD -Nephrology prn order set utilized -He does not appear to be volume overloaded or otherwise in need of acute HD -Nephrology notified that patient will need HD -Continue Phoslo  HTN (hypertension)- (present on admission) -Continue Norvasc -Will cover with prn IV hydralazine  Type 2 diabetes mellitus with chronic kidney disease on chronic dialysis ([place]) -Prior A1c was 7.9, indicating suboptimal control -Cover with very sensitive-scale  SSI     Advance Care Planning:   Code Status: Full Code   Consults: Nephrology; GI; Peripheral vascular navigator; diabetes coordinator; toc team; wound care; and nutrition  Family Communication: I spoke with his sister the evening following admission  Severity of Illness: The appropriate patient status for this patient is INPATIENT. Inpatient status is judged to be reasonable and necessary in order to provide the required intensity of service to ensure the  patient's safety. The patient's presenting symptoms, physical exam findings, and initial radiographic and laboratory data in the context of their chronic comorbidities is felt to place them at high risk for further clinical deterioration. Furthermore, it is not anticipated that the patient will be medically stable for discharge from the hospital within 2 midnights of admission.   * I certify that at the point of admission it is my clinical judgment that the patient will require inpatient hospital care spanning beyond 2 midnights from the point of admission due to high intensity of service, high risk for further deterioration and high frequency of surveillance required.*  Author: Karmen Bongo 10/13/2021 5:58 PM  For on call review [email or web address].

## 2021-10-13 NOTE — Progress Notes (Signed)
Pt receives out-pt HD at [facility] on MWF. Pt arrives at 12:00 for 12:20 chair time. Will assist as needed.    Melven Sartorius Renal Navigator [phone]

## 2021-10-13 NOTE — Assessment & Plan Note (Signed)
-  He is due for lymph node biopsy in the axillary region on 1/3 at the [place]

## 2021-10-13 NOTE — Assessment & Plan Note (Addendum)
-  Patient on chronic MWF HD - continue HD inpt per nephrology -Continue Phoslo

## 2021-10-13 NOTE — Consult Note (Addendum)
Consultation  Referring Provider:  [facility]/ Yates MD Primary Care Physician:  [facility] Primary Gastroenterologist:  none/unassigned  Reason for Consultation: Normocytic anemia in setting of chronic Eliquis, 6 g drop in hemoglobin over the past 2 months.  HPI: Roger Vazquez is a 70 y.o. male, nursing home resident who we are asked to evaluate regarding  normocytic anemia with 6 g drop in hemoglobin over the past 2 months.  Patient has end-stage renal disease and is on dialysis Monday Wednesday Friday, on chronic Eliquis for hypercoagulable state, history of adult onset diabetes mellitus, hypertension, peripheral arterial disease status post left BKA. Patient says he has not had any prior GI evaluation.  He has previously been followed at the [place] but says he has not had colonoscopy.  He has no current GI symptoms, specifically no dysphagia or odynophagia, no abdominal pain, no nausea or vomiting, no changes in bowel habits.  He says he is unaware whether there has been any blood in his stool because he wears depends and staff at the nursing home cleans him. Hemoglobin was 12.6 on 08/16/2021, down to 6.4 on 10/07/2021 and 6.6 last night on admission/hematocrit 20.6/MCV 95.8 BUN 21/creatinine 5.62 LFTs within normal limits. No abdominal imaging. Patient did have admission in early November 2022 with acute COVID-19 infection.  He is being transfused 2 units of packed RBCs.. Hemoccult is pending Iron studies show ferritin of 860/iron 67/TIBC 217/sat 31 B12 and folate within normal limits.   Past Medical History:  Diagnosis Date   Anemia    Blood dyscrasia    history of coagulation deficit per [facility] LPN nursing home   Cancer [facility])    Lung   Diabetes mellitus    Hepatitis    Hypertension    Renal disorder    MWF- [facility]    Past Surgical History:  Procedure Laterality Date   ABDOMINAL AORTOGRAM W/LOWER EXTREMITY Left 04/28/2021   Procedure:  ABDOMINAL AORTOGRAM W/LOWER EXTREMITY;  Surgeon: Cherre Robins, MD;  Location: [place] [facility];  Service: Cardiovascular;  Laterality: Left;   AMPUTATION Left 05/04/2021   Procedure: LEFT SECOND TOE AMPUTATION;  Surgeon: Cherre Robins, MD;  Location: [place];  Service: Vascular;  Laterality: Left;   AMPUTATION Left 06/08/2021   Procedure: LEFT BELOW KNEE AMPUTATION;  Surgeon: Cherre Robins, MD;  Location: [place];  Service: Vascular;  Laterality: Left;   APPLICATION OF WOUND VAC Left 06/08/2021   Procedure: APPLICATION OF WOUND VAC;  Surgeon: Cherre Robins, MD;  Location: [place];  Service: Vascular;  Laterality: Left;   artiovenous graft placement Left    AV FISTULA PLACEMENT Right 11/27/2018   Procedure: INSERTION 4-7MM X 45CM GORETEX GRAFT RIGHT FOREARM ;  Surgeon: Rosetta Posner, MD;  Location: [place];  Service: Vascular;  Laterality: Right;   [place] REMOVAL Left 08/06/2018   Procedure: REMOVAL OF LEFT ARM GRAFT;  Surgeon: Rosetta Posner, MD;  Location: [place];  Service: Vascular;  Laterality: Left;   INSERTION OF DIALYSIS CATHETER Right 08/08/2018   Procedure: INSERTION OF TUNNELED  DIALYSIS CATHETER;  Surgeon: Marty Heck, MD;  Location: [place];  Service: Vascular;  Laterality: Right;   IR DIALY SHUNT INTRO [place] W/IMG RIGHT Right 05/26/2021   IR REMOVAL TUN CV CATH W/O FL  02/19/2019   IR US GUIDE VASC ACCESS RIGHT  05/26/2021   LUNG SURGERY     patient not if they removed part of lung. Was  done at [place] Left 04/28/2021   Procedure: PERIPHERAL VASCULAR BALLOON ANGIOPLASTY;  Surgeon: Cherre Robins, MD;  Location: [place] [facility];  Service: Cardiovascular;  Laterality: Left;  popliteal and posterial tibial   TEE WITHOUT CARDIOVERSION N/A 07/31/2018   Procedure: TRANSESOPHAGEAL ECHOCARDIOGRAM (TEE);  Surgeon: Pixie Casino, MD;  Location: [facility] ENDOSCOPY;  Service: Cardiovascular;  Laterality: N/A;    TRANSMETATARSAL AMPUTATION Left 05/22/2021   Procedure: LEFT TRANSMETATARSAL AMPUTATION;  Surgeon: Elam Dutch, MD;  Location: [facility] OR;  Service: Vascular;  Laterality: Left;    Prior to Admission medications   Medication Sig Start Date End Date Taking? Authorizing Provider  acetaminophen (TYLENOL) 325 MG tablet Take 650 mg by mouth every 6 (six) hours.   Yes [provider]  amLODipine (NORVASC) 10 MG tablet Take 1 tablet (10 mg total) by mouth daily. 08/16/21  Yes Ghimire, Henreitta Leber, MD  apixaban (ELIQUIS) 5 MG TABS tablet Take 1 tablet (5 mg total) by mouth 2 (two) times daily. 08/19/21  Yes Ghimire, Henreitta Leber, MD  aspirin 81 MG chewable tablet Chew 81 mg by mouth daily. (0800)   Yes [provider]  calcium acetate (PHOSLO) 667 MG capsule Take 667 mg by mouth 3 (three) times daily with meals. (0800, 1300 & 1700)   Yes [provider]  ferrous sulfate 325 (65 FE) MG tablet Take 325 mg by mouth daily with breakfast.   Yes [provider]  hydrocortisone (ANUSOL-HC) 25 MG suppository Place 25 mg rectally 2 (two) times daily as needed for hemorrhoids or anal itching.   Yes [provider]  lidocaine-prilocaine (EMLA) cream Apply 1 application topically See admin instructions. As needed on dialysis days.Laurine Blazer and Friday) 04/28/21  Yes [provider]  Multiple Vitamin (MULITIVITAMIN WITH MINERALS) TABS Take 1 tablet by mouth daily. 10/12/11  Yes Robbie Lis, MD  Nutritional Supplements (NOVASOURCE RENAL PO) Take 237 mLs by mouth daily.   Yes [provider]  oxyCODONE (OXY IR/ROXICODONE) 5 MG immediate release tablet Take 5 mg by mouth 2 (two) times daily as needed for severe pain.   Yes [provider]  polyethylene glycol (MIRALAX / GLYCOLAX) 17 g packet Take 17 g by mouth 2 (two) times daily. 05/05/21  Yes Ezequiel Essex, MD  senna (SENOKOT) 8.6 MG TABS tablet Take 1 tablet (8.6 mg total) by mouth daily.  05/05/21  Yes Ezequiel Essex, MD  apixaban (ELIQUIS) 5 MG TABS tablet Take 2 tablets (10 mg total) by mouth 2 (two) times daily for 7 doses. Patient not taking: Reported on 10/13/2021 08/15/21 08/19/21  Jonetta Osgood, MD    Current Facility-Administered Medications  Medication Dose Route Frequency Provider Last Rate Last Admin   0.9 %  sodium chloride infusion  10 mL/hr Intravenous Once Mesner, Corene Cornea, MD       Chlorhexidine Gluconate Cloth 2 % PADS 6 each  6 each Topical Q0600 Elmarie Shiley, MD       Current Outpatient Medications  Medication Sig Dispense Refill   acetaminophen (TYLENOL) 325 MG tablet Take 650 mg by mouth every 6 (six) hours.     amLODipine (NORVASC) 10 MG tablet Take 1 tablet (10 mg total) by mouth daily.     apixaban (ELIQUIS) 5 MG TABS tablet Take 1 tablet (5 mg total) by mouth 2 (two) times daily. 60 tablet    aspirin 81 MG chewable tablet Chew 81 mg by mouth daily. (0800)  calcium acetate (PHOSLO) 667 MG capsule Take 667 mg by mouth 3 (three) times daily with meals. (0800, 1300 & 1700)     ferrous sulfate 325 (65 FE) MG tablet Take 325 mg by mouth daily with breakfast.     hydrocortisone (ANUSOL-HC) 25 MG suppository Place 25 mg rectally 2 (two) times daily as needed for hemorrhoids or anal itching.     lidocaine-prilocaine (EMLA) cream Apply 1 application topically See admin instructions. As needed on dialysis days.(Monday,Wednesday and Friday)     Multiple Vitamin (MULITIVITAMIN WITH MINERALS) TABS Take 1 tablet by mouth daily. 30 tablet 0   Nutritional Supplements (NOVASOURCE RENAL PO) Take 237 mLs by mouth daily.     oxyCODONE (OXY IR/ROXICODONE) 5 MG immediate release tablet Take 5 mg by mouth 2 (two) times daily as needed for severe pain.     polyethylene glycol (MIRALAX / GLYCOLAX) 17 g packet Take 17 g by mouth 2 (two) times daily. 14 each 0   senna (SENOKOT) 8.6 MG TABS tablet Take 1 tablet (8.6 mg total) by mouth daily. 7 tablet 0   apixaban  (ELIQUIS) 5 MG TABS tablet Take 2 tablets (10 mg total) by mouth 2 (two) times daily for 7 doses. (Patient not taking: Reported on 10/13/2021) 60 tablet     Allergies as of 10/12/2021 - Review Complete 10/12/2021  Allergen Reaction Noted   Sildenafil Other (See Comments) 02/14/2017    History reviewed. No pertinent family history.  Social History   Socioeconomic History   Marital status: Divorced    Spouse name: Not on file   Number of children: Not on file   Years of education: Not on file   Highest education level: Not on file  Occupational History   Occupation: retired  Tobacco Use   Smoking status: Former    Packs/day: 0.50    Years: 10.00    Pack years: 5.00    Types: Cigarettes    Quit date: 2001    Years since quitting: 22.0   Smokeless tobacco: Former    Quit date: 08/01/2015  Vaping Use   Vaping Use: Never used  Substance and Sexual Activity   Alcohol use: Not Currently    Alcohol/week: 2.0 standard drinks    Types: 2 Cans of beer per week   Drug use: Not Currently    Types: Marijuana    Comment: last time 11/19/2018   Sexual activity: Not on file    Comment: Pt uses vicodin  Other Topics Concern   Not on file  Social History Narrative   Not on file   Social Determinants of Health   Financial Resource Strain: Not on file  Food Insecurity: Not on file  Transportation Needs: Not on file  Physical Activity: Not on file  Stress: Not on file  Social Connections: Not on file  Intimate Partner Violence: Not on file    Review of Systems: Pertinent positive and negative review of systems were noted in the above HPI section.  All other review of systems was otherwise negative.   Physical Exam: Vital signs in last 24 hours: Temp:  [98.2 F (36.8 C)-98.9 F (37.2 C)] 98.7 F (37.1 C) (12/30 0800) Pulse Rate:  [78-86] 84 (12/30 0800) Resp:  [10-20] 18 (12/30 0800) BP: (139-163)/(65-86) 153/71 (12/30 0800) SpO2:  [95 %-100 %] 100 % (12/30  0800) Weight:  [55 kg] 55 kg (12/29 2206)   General:   Alert,  Well-developed, chronically ill-appearing older African-American male pleasant and cooperative in NAD  Head:  Normocephalic and atraumatic. Eyes:  Sclera clear, no icterus.   Conjunctiva pale Ears:  Normal auditory acuity. Nose:  No deformity, discharge,  or lesions. Mouth:  No deformity or lesions.   Neck:  Supple; no masses or thyromegaly. Lungs:  Clear throughout to auscultation.   No wheezes, crackles, or rhonchi. Heart:  Regular rate and rhythm; no murmurs, clicks, rubs,  or gallops. Abdomen:  Soft,nontender, BS active,nonpalp mass or hsm.   Rectal; not done, stool for occult blood pending Msk:  Symmetrical without gross deformities. . Pulses:  Normal pulses noted. Extremities: Status post left BKA, right lower extremity in boot Neurologic:  Alert and  oriented x4;  grossly normal neurologically. Skin:  Intact without significant lesions or rashes.. Psych:  Alert and cooperative. Normal mood and affect.  Intake/Output from previous day: No intake/output data recorded. Intake/Output this shift: Total I/O In: 315 [Blood:315] Out: -   Lab Results: Recent Labs    10/12/21 2231  WBC 9.3  HGB 6.6*  HCT 20.6*  PLT 263   BMET Recent Labs    10/12/21 2231  NA 138  K 4.3  CL 95*  CO2 32  GLUCOSE 161*  BUN 21  CREATININE 5.62*  CALCIUM 9.1   LFT Recent Labs    10/12/21 2231  PROT 8.6*  ALBUMIN 3.3*  AST 24  ALT 15  ALKPHOS 79  BILITOT 0.5     IMPRESSION:  #48 70 year old African-American male with new profound anemia, normocytic, not iron deficient, with 6g drop in hemoglobin over the past 2 months. No overt bleeding noted, Hemoccult pending  Rule out occult GI blood loss, in setting of chronic Eliquis-possible gastropathy, ulcer disease, AVMs, occult neoplasm Consider intra-abdominal/retroperitoneal bleed in setting of chronic Eliquis  #2 end-stage renal disease on dialysis-will be dialyzed  today #3 peripheral arterial disease status post left BKA #4 adult onset diabetes mellitus #5 Hypertension #6 History of hypercoagulable state on chronic Eliquis  Plan:  Regular diet today, proceed with dialysis Transfuse to keep hemoglobin above 7 PT/INR Start PPI, does not need to be infusion as no active bleeding Consider CT to rule out intra-abdominal/retroperitoneal bleed if Hemoccult negative Patient will need EGD and colonoscopy this admission.  Timing to be determined. Patient reluctantly agreeable to colonoscopy if needed. Hold Eliquis for now, could place on heparin if needed   Amy Esterwood PA-C 10/13/2021, 9:25 AM     Attending Physician Note   I have taken a history, reviewed the chart and examined the patient. I personally saw the patient and performed a substantive portion of this encounter, including a complete performance of at least one of the key components, in conjunction with the APP. I agree with the APP's note, impression and recommendations.   *Worsening anemia with 6g Hgb decrease over the past 2 months. No overt bleeding noted. No evidence of iron, B12 or folate deficiency. FOBT negative. R/O occult GI losses, retroperitoneal bleed. Transfuse to Hgb > 7. Pantoprazole 40 mg qd for now. Proceed with CT AP and if negative recommend colonoscopy and EGD at some point this admission. He is reluctantly agreeable at this time.    *Hypercoagulable state on Eliquis. Hold for now.   *ESRD on HD  *S/P L BKA  Lucio Edward, MD [facility] See Shea Evans, [facility], for our on call provider

## 2021-10-13 NOTE — Consult Note (Signed)
[name] Renal Consultation Note    Indication for Consultation:  Management of ESRD/hemodialysis, anemia, hypertension/volume, and secondary hyperparathyroidism.  HPI: Roger Vazquez is a 70 y.o. male with PMH including ESRD on dialysis, left BKA, diabetes mellitus, hypertension, who presented to the ED after labs at his SNF showed a hemoglobin of 6.4.  Patient reports he is not sure if he has had any blood in his stool because he is unable to see his bowel movements.  He does endorse feeling tired and weak.  At time of my exam, he reports feeling hungry.  He denies any shortness of breath, chest pain, palpitations, dizziness, abdominal pain, nausea, vomiting, diarrhea.  Reports he occasionally gets nosebleeds, but denies any other bleeding.  No recent fever or chills.  Denies any recent issues with dialysis.  On admission, blood pressure moderately elevated, hemoglobin 6.6, platelets 263, K4.3, BUN 21, creatinine 5.62, calcium 9.1, albumin 3.3.  2 units PRBC ordered. GI has been consulted and is planning EGD and colonoscopy. Currently on PPI Nephrology has been consulted for management of ESRD.  Patient attends dialysis on Monday, Wednesday, Friday.  He does not get any heparin bolus with dialysis.  Last dialysis session was 10/11/2021.  Past Medical History:  Diagnosis Date   Anemia    Blood dyscrasia    history of coagulation deficit per [facility] nursing home   Cancer [facility])    Lung   Diabetes mellitus    Hepatitis    Hypertension    Renal disorder    MWF- [facility]   Past Surgical History:  Procedure Laterality Date   ABDOMINAL AORTOGRAM W/LOWER EXTREMITY Left 04/28/2021   Procedure: ABDOMINAL AORTOGRAM W/LOWER EXTREMITY;  Surgeon: Cherre Robins, MD;  Location: [place] [facility];  Service: Cardiovascular;  Laterality: Left;   AMPUTATION Left 05/04/2021   Procedure: LEFT SECOND TOE AMPUTATION;  Surgeon: Cherre Robins, MD;  Location: [place];   Service: Vascular;  Laterality: Left;   AMPUTATION Left 06/08/2021   Procedure: LEFT BELOW KNEE AMPUTATION;  Surgeon: Cherre Robins, MD;  Location: [place];  Service: Vascular;  Laterality: Left;   APPLICATION OF WOUND VAC Left 06/08/2021   Procedure: APPLICATION OF WOUND VAC;  Surgeon: Cherre Robins, MD;  Location: [place];  Service: Vascular;  Laterality: Left;   artiovenous graft placement Left    AV FISTULA PLACEMENT Right 11/27/2018   Procedure: INSERTION 4-7MM X 45CM GORETEX GRAFT RIGHT FOREARM ;  Surgeon: Rosetta Posner, MD;  Location: [place];  Service: Vascular;  Laterality: Right;   [place] REMOVAL Left 08/06/2018   Procedure: REMOVAL OF LEFT ARM GRAFT;  Surgeon: Rosetta Posner, MD;  Location: [place];  Service: Vascular;  Laterality: Left;   INSERTION OF DIALYSIS CATHETER Right 08/08/2018   Procedure: INSERTION OF TUNNELED  DIALYSIS CATHETER;  Surgeon: Marty Heck, MD;  Location: [place];  Service: Vascular;  Laterality: Right;   IR DIALY SHUNT INTRO [place] W/IMG RIGHT Right 05/26/2021   IR REMOVAL TUN CV CATH W/O FL  02/19/2019   IR US GUIDE VASC ACCESS RIGHT  05/26/2021   LUNG SURGERY     patient not if they removed part of lung. Was done at [place] Left 04/28/2021   Procedure: PERIPHERAL VASCULAR BALLOON ANGIOPLASTY;  Surgeon: Cherre Robins, MD;  Location: [place] [facility];  Service: Cardiovascular;  Laterality: Left;  popliteal and posterial tibial   TEE WITHOUT CARDIOVERSION  N/A 07/31/2018   Procedure: TRANSESOPHAGEAL ECHOCARDIOGRAM (TEE);  Surgeon: Pixie Casino, MD;  Location: [facility];  Service: Cardiovascular;  Laterality: N/A;   TRANSMETATARSAL AMPUTATION Left 05/22/2021   Procedure: LEFT TRANSMETATARSAL AMPUTATION;  Surgeon: Elam Dutch, MD;  Location: [place];  Service: Vascular;  Laterality: Left;   History reviewed. No pertinent family history. Social History:  reports that he quit smoking about 22  years ago. His smoking use included cigarettes. He has a 5.00 pack-year smoking history. He quit smokeless tobacco use about 6 years ago. He reports that he does not currently use alcohol after a past usage of about 2.0 standard drinks per week. He reports that he does not currently use drugs after having used the following drugs: Marijuana.  ROS: As per HPI otherwise negative.  Physical Exam: Vitals:   10/13/21 0700 10/13/21 0724 10/13/21 0800 10/13/21 1000  BP: (!) 150/86 (!) 158/73 (!) 153/71 (!) 160/76  Pulse: 86 86 84 84  Resp: 18 18 18 15   Temp:  98.9 F (37.2 C) 98.7 F (37.1 C)   TempSrc:  Oral Oral   SpO2: 99% 100% 100% 96%  Weight:      Height:         General: WDWN elderly male, alert and in no acute distress. Head: Normocephalic, atraumatic, sclera non-icteric, mucus membranes are moist. Neck: JVD not elevated. Lungs: Clear bilaterally to auscultation without wheezes, rales, or rhonchi. Breathing is unlabored on RA Heart: RRR with normal S1, S2. No murmurs, rubs, or gallops appreciated. Abdomen: Soft, non-tender, non-distended with normoactive bowel sounds.  Musculoskeletal:  Strength and tone appear normal for age. Lower extremities: No edema b/l lower extremities. L BKA. R foot in padded boot Neuro: Alert and oriented X 3. Moves all extremities spontaneously. Psych:  Responds to questions appropriately with a normal affect. Dialysis Access: TDC with dry, intact dressing  Allergies  Allergen Reactions   Sildenafil Other (See Comments)    Pt stated, "Makes my Blood Pressure drop"   Prior to Admission medications   Medication Sig Start Date End Date Taking? Authorizing Provider  acetaminophen (TYLENOL) 325 MG tablet Take 650 mg by mouth every 6 (six) hours.   Yes [provider]  amLODipine (NORVASC) 10 MG tablet Take 1 tablet (10 mg total) by mouth daily. 08/16/21  Yes Ghimire, Henreitta Leber, MD  apixaban (ELIQUIS) 5 MG TABS tablet Take 1 tablet (5 mg total) by  mouth 2 (two) times daily. 08/19/21  Yes Ghimire, Henreitta Leber, MD  aspirin 81 MG chewable tablet Chew 81 mg by mouth daily. (0800)   Yes [provider]  calcium acetate (PHOSLO) 667 MG capsule Take 667 mg by mouth 3 (three) times daily with meals. (0800, 1300 & 1700)   Yes [provider]  ferrous sulfate 325 (65 FE) MG tablet Take 325 mg by mouth daily with breakfast.   Yes [provider]  hydrocortisone (ANUSOL-HC) 25 MG suppository Place 25 mg rectally 2 (two) times daily as needed for hemorrhoids or anal itching.   Yes [provider]  lidocaine-prilocaine (EMLA) cream Apply 1 application topically See admin instructions. As needed on dialysis days.Laurine Blazer and Friday) 04/28/21  Yes [provider]  Multiple Vitamin (MULITIVITAMIN WITH MINERALS) TABS Take 1 tablet by mouth daily. 10/12/11  Yes Robbie Lis, MD  Nutritional Supplements (NOVASOURCE RENAL PO) Take 237 mLs by mouth daily.   Yes [provider]  oxyCODONE (OXY IR/ROXICODONE) 5 MG immediate release tablet Take 5 mg  by mouth 2 (two) times daily as needed for severe pain.   Yes [provider]  polyethylene glycol (MIRALAX / GLYCOLAX) 17 g packet Take 17 g by mouth 2 (two) times daily. 05/05/21  Yes Ezequiel Essex, MD  senna (SENOKOT) 8.6 MG TABS tablet Take 1 tablet (8.6 mg total) by mouth daily. 05/05/21  Yes Ezequiel Essex, MD  apixaban (ELIQUIS) 5 MG TABS tablet Take 2 tablets (10 mg total) by mouth 2 (two) times daily for 7 doses. Patient not taking: Reported on 10/13/2021 08/15/21 08/19/21  Jonetta Osgood, MD   Current Facility-Administered Medications  Medication Dose Route Frequency Provider Last Rate Last Admin   0.9 %  sodium chloride infusion  10 mL/hr Intravenous Once Karmen Bongo, MD       acetaminophen (TYLENOL) tablet 650 mg  650 mg Oral Q6H PRN Karmen Bongo, MD       Or   acetaminophen (TYLENOL) suppository 650 mg  650 mg Rectal Q6H PRN  Karmen Bongo, MD       albuterol (PROVENTIL) (2.5 MG/3ML) 0.083% nebulizer solution 2.5 mg  2.5 mg Nebulization Q2H PRN Karmen Bongo, MD       amLODipine (NORVASC) tablet 10 mg  10 mg Oral Daily Karmen Bongo, MD       calcium acetate (PHOSLO) capsule 667 mg  667 mg Oral TID WC Karmen Bongo, MD       calcium carbonate (dosed in mg elemental calcium) suspension 500 mg of elemental calcium  500 mg of elemental calcium Oral Q6H PRN Karmen Bongo, MD       camphor-menthol [facility]) lotion 1 application  1 application Topical T9Q PRN Karmen Bongo, MD       And   hydrOXYzine (ATARAX) tablet 25 mg  25 mg Oral Q8H PRN Karmen Bongo, MD       Chlorhexidine Gluconate Cloth 2 % PADS 6 each  6 each Topical Q0600 Karmen Bongo, MD       docusate sodium [facility]) enema 283 mg  1 enema Rectal PRN Karmen Bongo, MD       feeding supplement (NEPRO CARB STEADY) liquid 237 mL  237 mL Oral TID PRN Karmen Bongo, MD       Derrill Memo ON 10/14/2021] ferrous sulfate tablet 325 mg  325 mg Oral Q breakfast Karmen Bongo, MD       hydrALAZINE (APRESOLINE) injection 5 mg  5 mg Intravenous Q4H PRN Karmen Bongo, MD       insulin aspart (novoLOG) injection 0-6 Units  0-6 Units Subcutaneous TID WC Karmen Bongo, MD       multivitamin with minerals tablet 1 tablet  1 tablet Oral Daily Karmen Bongo, MD       ondansetron [facility]) tablet 4 mg  4 mg Oral Q6H PRN Karmen Bongo, MD       Or   ondansetron [facility]) injection 4 mg  4 mg Intravenous Q6H PRN Karmen Bongo, MD       oxyCODONE (Oxy IR/ROXICODONE) immediate release tablet 5 mg  5 mg Oral BID PRN Karmen Bongo, MD       polyethylene glycol (MIRALAX / GLYCOLAX) packet 17 g  17 g Oral BID Karmen Bongo, MD       senna Donavan Burnet) tablet 8.6 mg  1 tablet Oral Daily Karmen Bongo, MD       sodium chloride flush (NS) 0.9 % injection 3 mL  3 mL Intravenous Q12H Karmen Bongo, MD       sorbitol 70 % solution  30 mL  30 mL Oral PRN Karmen Bongo, MD       zolpidem Lorrin Mais) tablet 5 mg  5 mg Oral QHS PRN Karmen Bongo, MD       Current Outpatient Medications  Medication Sig Dispense Refill   acetaminophen (TYLENOL) 325 MG tablet Take 650 mg by mouth every 6 (six) hours.     amLODipine (NORVASC) 10 MG tablet Take 1 tablet (10 mg total) by mouth daily.     apixaban (ELIQUIS) 5 MG TABS tablet Take 1 tablet (5 mg total) by mouth 2 (two) times daily. 60 tablet    aspirin 81 MG chewable tablet Chew 81 mg by mouth daily. (0800)     calcium acetate (PHOSLO) 667 MG capsule Take 667 mg by mouth 3 (three) times daily with meals. (0800, 1300 & 1700)     ferrous sulfate 325 (65 FE) MG tablet Take 325 mg by mouth daily with breakfast.     hydrocortisone (ANUSOL-HC) 25 MG suppository Place 25 mg rectally 2 (two) times daily as needed for hemorrhoids or anal itching.     lidocaine-prilocaine (EMLA) cream Apply 1 application topically See admin instructions. As needed on dialysis days.(Monday,Wednesday and Friday)     Multiple Vitamin (MULITIVITAMIN WITH MINERALS) TABS Take 1 tablet by mouth daily. 30 tablet 0   Nutritional Supplements (NOVASOURCE RENAL PO) Take 237 mLs by mouth daily.     oxyCODONE (OXY IR/ROXICODONE) 5 MG immediate release tablet Take 5 mg by mouth 2 (two) times daily as needed for severe pain.     polyethylene glycol (MIRALAX / GLYCOLAX) 17 g packet Take 17 g by mouth 2 (two) times daily. 14 each 0   senna (SENOKOT) 8.6 MG TABS tablet Take 1 tablet (8.6 mg total) by mouth daily. 7 tablet 0   apixaban (ELIQUIS) 5 MG TABS tablet Take 2 tablets (10 mg total) by mouth 2 (two) times daily for 7 doses. (Patient not taking: Reported on 10/13/2021) 60 tablet    Labs: Basic Metabolic Panel: Recent Labs  Lab 10/12/21 2231  NA 138  K 4.3  CL 95*  CO2 32  GLUCOSE 161*  BUN 21  CREATININE 5.62*  CALCIUM 9.1   Liver Function Tests: Recent Labs  Lab 10/12/21 2231  AST 24  ALT 15  ALKPHOS 79  BILITOT 0.5  PROT 8.6*   ALBUMIN 3.3*   No results for input(s): LIPASE, AMYLASE in the last 168 hours. No results for input(s): AMMONIA in the last 168 hours. CBC: Recent Labs  Lab 10/12/21 2231  WBC 9.3  NEUTROABS 6.3  HGB 6.6*  HCT 20.6*  MCV 95.8  PLT 263   Cardiac Enzymes: No results for input(s): CKTOTAL, CKMB, CKMBINDEX, TROPONINI in the last 168 hours. CBG: No results for input(s): GLUCAP in the last 168 hours. Iron Studies:  Recent Labs    10/13/21 0822  IRON 67  TIBC 217*  FERRITIN 860*   Studies/Results: DG Foot Complete Right  Result Date: 10/12/2021 CLINICAL DATA:  Right foot pain EXAM: RIGHT FOOT COMPLETE - 3+ VIEW COMPARISON:  None. FINDINGS: Diffuse bone demineralization. No evidence of acute fracture or dislocation. No focal bone lesion or bone destruction. No radiographic evidence of osteomyelitis. Prominent vascular calcifications. Postoperative intramedullary rod in the tibia. Incompletely visualized. No soft tissue gas or foreign body. IMPRESSION: Diffuse bone demineralization. No acute bony abnormalities. Prominent vascular calcifications. Electronically Signed   By: Lucienne Capers M.D.   On: 10/12/2021 23:51    Dialysis Orders:  Center: [place]  on MWF . 180NRe, 3 hr 15 min, BFR 400, DFR A1.5, EDW 54kg, 2K 2Ca, TDC no heparin Mircera 256mcg IV q HD- last dose was 145mcg on 10/19 Calcium acetate 667mg  1 tab TID with meals Tums 200mg - 2 tabs PO BID  Assessment/Plan:  Symptomatic anemia: GI bleed suspected, GI is on board. Pt currently on PPI, plavix on hold, plan is for EGD and colonoscopy.   ESRD:  Dialysis on MWF schedule, planned for dialysis later today.   Hypertension/volume: BP moderately elevated. Euvolemic on exam, will likely have some volume onboard after transfusions. UF goal 2.L with HD today. Not on any antihypertensives outpatient  Anemia: Due to #1 above. Mircera has been ordered outpatient but not started yet, will order dose of aranesp with HD today.    Metabolic bone disease: Calcium on the high side, noted he takes tums and calcium acetate outpatient. Currently NPO but will resume calcium acetate once he is eating, hold tums for now.   Nutrition:  Currently NPO  Anice Paganini, PA-C 10/13/2021, 11:21 AM  [facility] Pager: [phone]

## 2021-10-14 ENCOUNTER — Inpatient Hospital Stay (HOSPITAL_COMMUNITY): Payer: No Typology Code available for payment source

## 2021-10-14 DIAGNOSIS — Z7901 Long term (current) use of anticoagulants: Secondary | ICD-10-CM | POA: Insufficient documentation

## 2021-10-14 DIAGNOSIS — D649 Anemia, unspecified: Secondary | ICD-10-CM | POA: Diagnosis not present

## 2021-10-14 LAB — CBC
HCT: 22.7 % — ABNORMAL LOW (ref 39.0–52.0)
Hemoglobin: 7.8 g/dL — ABNORMAL LOW (ref 13.0–17.0)
MCH: 32.1 pg (ref 26.0–34.0)
MCHC: 34.4 g/dL (ref 30.0–36.0)
MCV: 93.4 fL (ref 80.0–100.0)
Platelets: 219 10*3/uL (ref 150–400)
RBC: 2.43 MIL/uL — ABNORMAL LOW (ref 4.22–5.81)
RDW: 18.6 % — ABNORMAL HIGH (ref 11.5–15.5)
WBC: 7.5 10*3/uL (ref 4.0–10.5)
nRBC: 0 % (ref 0.0–0.2)

## 2021-10-14 LAB — BASIC METABOLIC PANEL
Anion gap: 10 (ref 5–15)
BUN: 17 mg/dL (ref 8–23)
CO2: 29 mmol/L (ref 22–32)
Calcium: 8.9 mg/dL (ref 8.9–10.3)
Chloride: 98 mmol/L (ref 98–111)
Creatinine, Ser: 4.71 mg/dL — ABNORMAL HIGH (ref 0.61–1.24)
GFR, Estimated: 13 mL/min — ABNORMAL LOW (ref >60)
Glucose, Bld: 111 mg/dL — ABNORMAL HIGH (ref 70–99)
Potassium: 3.9 mmol/L (ref 3.5–5.1)
Sodium: 137 mmol/L (ref 135–145)

## 2021-10-14 LAB — URINALYSIS, ROUTINE W REFLEX MICROSCOPIC
Bacteria, UA: NONE SEEN
Bilirubin Urine: NEGATIVE
Glucose, UA: 50 mg/dL — AB
Ketones, ur: NEGATIVE mg/dL
Nitrite: NEGATIVE
Protein, ur: 100 mg/dL — AB
Specific Gravity, Urine: 1.009 (ref 1.005–1.030)
pH: 9 — ABNORMAL HIGH (ref 5.0–8.0)

## 2021-10-14 LAB — TYPE AND SCREEN
ABO/RH(D): O POS
Antibody Screen: POSITIVE
DAT, IgG: POSITIVE
Donor AG Type: NEGATIVE
Donor AG Type: NEGATIVE
Unit division: 0
Unit division: 0

## 2021-10-14 LAB — BPAM RBC
Blood Product Expiration Date: 202301202359
Blood Product Expiration Date: 202301202359
ISSUE DATE / TIME: 202212300404
ISSUE DATE / TIME: 202212300749
Unit Type and Rh: 5100
Unit Type and Rh: 5100

## 2021-10-14 LAB — GLUCOSE, CAPILLARY
Glucose-Capillary: 104 mg/dL — ABNORMAL HIGH (ref 70–99)
Glucose-Capillary: 116 mg/dL — ABNORMAL HIGH (ref 70–99)
Glucose-Capillary: 176 mg/dL — ABNORMAL HIGH (ref 70–99)
Glucose-Capillary: 96 mg/dL (ref 70–99)

## 2021-10-14 LAB — HEPATITIS B SURFACE ANTIBODY, QUANTITATIVE: Hep B S AB Quant (Post): 12.9 m[IU]/mL (ref >9.9)

## 2021-10-14 MED ORDER — PANTOPRAZOLE SODIUM 40 MG IV SOLR
40.0000 mg | Freq: Two times a day (BID) | INTRAVENOUS | Status: DC
  Administered 2021-10-14 – 2021-10-18 (×10): 40 mg via INTRAVENOUS
  Filled 2021-10-14 (×9): qty 40

## 2021-10-14 MED ORDER — SODIUM CHLORIDE 0.9 % IV SOLN
1.0000 g | INTRAVENOUS | Status: DC
  Administered 2021-10-14: 1 g via INTRAVENOUS
  Filled 2021-10-14: qty 10

## 2021-10-14 MED ORDER — RENA-VITE PO TABS
1.0000 | ORAL_TABLET | Freq: Every day | ORAL | Status: DC
  Administered 2021-10-14 – 2021-10-22 (×9): 1 via ORAL
  Filled 2021-10-14 (×10): qty 1

## 2021-10-14 MED ORDER — NEPRO/CARBSTEADY PO LIQD
237.0000 mL | Freq: Three times a day (TID) | ORAL | Status: DC
  Administered 2021-10-14 – 2021-10-23 (×16): 237 mL via ORAL

## 2021-10-14 NOTE — Progress Notes (Signed)
PROGRESS NOTE    Roger Vazquez  NOM:[id number] DOB: [birth year] DOA: 10/12/2021 PCP: [facility]   Brief Narrative: 70 year old with past medical history significant for ESRD on hemodialysis MWF, PAD status post left BKA, diabetes, hypertension presented with a hemoglobin of 6.4, lab work performed at skilled nursing facility.  Reported feeling weak and tired.  He does not know if he has had bloody stool.  Patient admitted with anemia, hemoglobin a month ago was 12. He was admitted for further evaluation of  anemia.  GI has been consulted and plan is for endoscopy colonoscopy.    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  GI consulted, planning endoscopy, colonoscopy this admission.  Received one unit PRBC>  Hb 6.6---7.4--7.8 IV Protonix.   DVT:  Patient with a prior history of DVT He has been on Eliquis, continue to hold for now  Heel ulcer, right with unspecified severity He has been followed by vascular.  He had failed ABIs recently with inadequate perfusion to promote wound healing. MRI: pending.    ESRD  on hemodialysis: Nephrology Consulted and following  Hypertension: Continue with Norvasc  Type 2 diabetes with chronic kidney disease on chronic dialysis: Prior A1c 7.9 Continue With SSI   Markedly distended and enlarged bladder with thickened bladder wall and inflammation surrounding bladder. Findings are consistent with cystitis. In and out cath perform yield 500 cc urine. Send UA and culture. Follow.  UA with large leukocyte.  Start ceftriaxone.   See wound care documentation below.    Pressure Injury 10/13/21 Right Unstageable - Full thickness tissue loss in which the base of the injury is covered by slough (yellow, tan, gray, green or brown) and/or eschar (tan, brown or black) in the wound bed. (Active)  10/13/21   Location:   Location Orientation: Right  Staging: Unstageable - Full thickness tissue loss in which the base of the injury is covered by slough (yellow, tan, gray, green or brown) and/or eschar (tan, brown or black) in the wound bed.  Wound Description (Comments):   Present on Admission: Yes      Estimated body mass index is 18.33 kg/m as calculated from the following:   Height as of this encounter: 5\' 6"  (1.676 m).   Weight as of this encounter: 51.5 kg.   DVT prophylaxis: SCD Code Status: Full code Family Communication: sister over phone 12/31 Disposition Plan:  Status is: Inpatient  Remains inpatient appropriate because: evaluation for anemia         Consultants:  GI Nephrology   Procedures:    Antimicrobials:  Ceftriaxone   Subjective: He is alert, denies pain.   Objective: Vitals:   10/13/21 1945 10/13/21 2250 10/14/21 0412 10/14/21 0451  BP: (!) 151/66 (!) 152/70 (!) 145/71   Pulse: 86 84 74   Resp: 16 16 20    Temp: 98.9 F (37.2 C) 98.4 F (36.9 C) 97.9 F (36.6 C)   TempSrc: Oral     SpO2: 100% 100% 100%   Weight:    51.5 kg  Height:        Intake/Output Summary (Last 24 hours) at 10/14/2021 9628 Last data filed  at 10/13/2021 2200 Gross per 24 hour  Intake 237 ml  Output 1746 ml  Net -1509 ml   Filed Weights   10/13/21 1610 10/13/21 1810 10/14/21 0451  Weight: 54.4 kg 51.5 kg 51.5 kg    Examination:  General exam: Appears calm and comfortable  Respiratory system: Clear to auscultation. Respiratory effort normal. Cardiovascular system: S1 & S2 heard, RRR. No JVD, murmurs, rubs, gallops or clicks. No pedal edema. Gastrointestinal system: Abdomen is nondistended, soft and nontender. No organomegaly  or masses felt. Normal bowel sounds heard. Central nervous system: Alert and oriented. Extremities: right heel with dressing.   Data Reviewed: I have personally reviewed following labs and imaging studies  CBC: Recent Labs  Lab 10/12/21 2231 10/13/21 1606 10/14/21 0328  WBC 9.3 9.5 7.5  NEUTROABS 6.3  --   --   HGB 6.6* 7.4* 7.8*  HCT 20.6* 22.5* 22.7*  MCV 95.8 93.0 93.4  PLT 263 234 315   Basic Metabolic Panel: Recent Labs  Lab 10/12/21 2231 10/14/21 0328  NA 138 137  K 4.3 3.9  CL 95* 98  CO2 32 29  GLUCOSE 161* 111*  BUN 21 17  CREATININE 5.62* 4.71*  CALCIUM 9.1 8.9   GFR: Estimated Creatinine Clearance: 10.5 mL/min (A) (by C-G formula based on SCr of 4.71 mg/dL (H)). Liver Function Tests: Recent Labs  Lab 10/12/21 2231  AST 24  ALT 15  ALKPHOS 79  BILITOT 0.5  PROT 8.6*  ALBUMIN 3.3*   No results for input(s): LIPASE, AMYLASE in the last 168 hours. No results for input(s): AMMONIA in the last 168 hours. Coagulation Profile: Recent Labs  Lab 10/13/21 1300  INR 1.1   Cardiac Enzymes: No results for input(s): CKTOTAL, CKMB, CKMBINDEX, TROPONINI in the last 168 hours. BNP (last 3 results) No results for input(s): PROBNP in the last 8760 hours. HbA1C: No results for input(s): HGBA1C in the last 72 hours. CBG: Recent Labs  Lab 10/13/21 1207 10/13/21 1840 10/13/21 2055 10/14/21 0633  GLUCAP 129* 132* 176* 104*   Lipid Profile: No results for input(s): CHOL, HDL, LDLCALC, TRIG, CHOLHDL, LDLDIRECT in the last 72 hours. Thyroid Function Tests: No results for input(s): TSH, T4TOTAL, FREET4, T3FREE, THYROIDAB in the last 72 hours. Anemia Panel: Recent Labs    10/13/21 0822  VITAMINB12 700  FOLATE 21.8  FERRITIN 860*  TIBC 217*  IRON 67  RETICCTPCT 1.9   Sepsis Labs: No results for input(s): PROCALCITON, LATICACIDVEN in the last 168 hours.  Recent Results (from the past 240 hour(s))  Resp Panel by RT-PCR (Flu A&B, Covid) Nasopharyngeal  Swab     Status: None   Collection Time: 10/13/21  5:00 AM   Specimen: Nasopharyngeal Swab; Nasopharyngeal(NP) swabs in vial transport medium  Result Value Ref Range Status   SARS Coronavirus 2 by RT PCR NEGATIVE NEGATIVE Final    Comment: (NOTE) SARS-CoV-2 target nucleic acids are NOT DETECTED.  The SARS-CoV-2 RNA is generally detectable in upper respiratory specimens during the acute phase of infection. The lowest concentration of SARS-CoV-2 viral copies this assay can detect is 138 copies/mL. A negative result does not preclude SARS-Cov-2 infection and should not be used as the sole basis for treatment or other patient management decisions. A negative result may occur with  improper specimen collection/handling, submission of specimen other than nasopharyngeal swab, presence of viral mutation(s) within the areas targeted by this assay, and inadequate number of viral copies(<138 copies/mL). A negative result must be combined with clinical observations,  patient history, and epidemiological information. The expected result is Negative.  Fact Sheet for Patients:  EntrepreneurPulse.com.au  Fact Sheet for Healthcare Providers:  IncredibleEmployment.be  This test is no t yet approved or cleared by the [place] FDA and  has been authorized for detection and/or diagnosis of SARS-CoV-2 by FDA under an Emergency Use Authorization (EUA). This EUA will remain  in effect (meaning this test can be used) for the duration of the COVID-19 declaration under Section 564(b)(1) of the Act, 21 U.S.C.section 360bbb-3(b)(1), unless the authorization is terminated  or revoked sooner.       Influenza A by PCR NEGATIVE NEGATIVE Final   Influenza B by PCR NEGATIVE NEGATIVE Final    Comment: (NOTE) The Xpert Xpress SARS-CoV-2/FLU/RSV plus assay is intended as an aid in the diagnosis of influenza from Nasopharyngeal swab specimens and should not be used as a sole  basis for treatment. Nasal washings and aspirates are unacceptable for Xpert Xpress SARS-CoV-2/FLU/RSV testing.  Fact Sheet for Patients: EntrepreneurPulse.com.au  Fact Sheet for Healthcare Providers: IncredibleEmployment.be  This test is not yet approved or cleared by the [place] FDA and has been authorized for detection and/or diagnosis of SARS-CoV-2 by FDA under an Emergency Use Authorization (EUA). This EUA will remain in effect (meaning this test can be used) for the duration of the COVID-19 declaration under Section 564(b)(1) of the Act, 21 U.S.C. section 360bbb-3(b)(1), unless the authorization is terminated or revoked.  Performed at [facility], [place] [address], [place], [place] [address]          Radiology Studies: DG Foot Complete Right  Result Date: 10/12/2021 CLINICAL DATA:  Right foot pain EXAM: RIGHT FOOT COMPLETE - 3+ VIEW COMPARISON:  None. FINDINGS: Diffuse bone demineralization. No evidence of acute fracture or dislocation. No focal bone lesion or bone destruction. No radiographic evidence of osteomyelitis. Prominent vascular calcifications. Postoperative intramedullary rod in the tibia. Incompletely visualized. No soft tissue gas or foreign body. IMPRESSION: Diffuse bone demineralization. No acute bony abnormalities. Prominent vascular calcifications. Electronically Signed   By: Lucienne Capers M.D.   On: 10/12/2021 23:51        Scheduled Meds:  amLODipine  10 mg Oral Daily   calcium acetate  667 mg Oral TID WC   Chlorhexidine Gluconate Cloth  6 each Topical Q0600   ferrous sulfate  325 mg Oral Q breakfast   insulin aspart  0-6 Units Subcutaneous TID WC   multivitamin with minerals  1 tablet Oral Daily   pantoprazole  40 mg Oral Q0600   polyethylene glycol  17 g Oral BID   senna  1 tablet Oral Daily   sodium chloride flush  3 mL Intravenous Q12H   Continuous Infusions:  sodium chloride       LOS: 1  day    Time spent: 35 minutes.     Elmarie Shiley, MD [facility]   If 7PM-7AM, please contact night-coverage [email or web address]  10/14/2021, 8:02 AM

## 2021-10-14 NOTE — Progress Notes (Signed)
[facility] Progress Note   Subjective:   CT this AM suggestive of cystitis, UA ordered. Pt denies SOB, CP, palpitations, dizziness, abdominal pain and nausea.   Objective Vitals:   10/13/21 2250 10/14/21 0412 10/14/21 0451 10/14/21 1044  BP: (!) 152/70 (!) 145/71  (!) 160/75  Pulse: 84 74  79  Resp: 16 20  18   Temp: 98.4 F (36.9 C) 97.9 F (36.6 C)  98.3 F (36.8 C)  TempSrc:    Oral  SpO2: 100% 100%  100%  Weight:   51.5 kg   Height:       Physical Exam General: WDWN male, alert and in NAD Heart: RRR, no murmurs Lungs: CTA anteriorly without wheezing, rhonchi or rales Abdomen: Soft, non-tender, non-distended, +BS Extremities: No edema b/l LE. L BKA Dialysis Access: [facility] with dry intact dressing  Additional Objective Labs: Basic Metabolic Panel: Recent Labs  Lab 10/12/21 2231 10/14/21 0328  NA 138 137  K 4.3 3.9  CL 95* 98  CO2 32 29  GLUCOSE 161* 111*  BUN 21 17  CREATININE 5.62* 4.71*  CALCIUM 9.1 8.9   Liver Function Tests: Recent Labs  Lab 10/12/21 2231  AST 24  ALT 15  ALKPHOS 79  BILITOT 0.5  PROT 8.6*  ALBUMIN 3.3*   No results for input(s): LIPASE, AMYLASE in the last 168 hours. CBC: Recent Labs  Lab 10/12/21 2231 10/13/21 1606 10/14/21 0328  WBC 9.3 9.5 7.5  NEUTROABS 6.3  --   --   HGB 6.6* 7.4* 7.8*  HCT 20.6* 22.5* 22.7*  MCV 95.8 93.0 93.4  PLT 263 234 219   Blood Culture    Component Value Date/Time   SDES BLOOD BLOOD LEFT HAND 08/13/2021 1244   SDES BLOOD LEFT ANTECUBITAL 08/13/2021 1244   SPECREQUEST  08/13/2021 1244    BOTTLES DRAWN AEROBIC AND ANAEROBIC Blood Culture adequate volume   SPECREQUEST AEROBIC BOTTLE ONLY Blood Culture adequate volume 08/13/2021 1244   CULT  08/13/2021 1244    NO GROWTH 5 DAYS Performed at [facility], [place] [address], [place], [place] [address]    CULT  08/13/2021 1244    NO GROWTH 5 DAYS Performed at [facility], [place] [address] [place], [place] [address]     REPTSTATUS 08/18/2021 FINAL 08/13/2021 1244   REPTSTATUS 08/18/2021 FINAL 08/13/2021 1244    Cardiac Enzymes: No results for input(s): CKTOTAL, CKMB, CKMBINDEX, TROPONINI in the last 168 hours. CBG: Recent Labs  Lab 10/13/21 1207 10/13/21 1840 10/13/21 2055 10/14/21 0633  GLUCAP 129* 132* 176* 104*   Iron Studies:  Recent Labs    10/13/21 0822  IRON 67  TIBC 217*  FERRITIN 860*   @lablastinr3 @ Studies/Results: CT ABDOMEN PELVIS WO CONTRAST  Result Date: 10/14/2021 CLINICAL DATA:  6 gram drop in hemoglobin. Assess for intra-abdominal or retroperitoneal bleed. EXAM: CT ABDOMEN AND PELVIS WITHOUT CONTRAST TECHNIQUE: Multidetector CT imaging of the abdomen and pelvis was performed following the standard protocol without IV contrast. COMPARISON:  October 07, 2011 FINDINGS: Lower chest: Mild atelectasis of posterior lung bases are noted. The heart size is mildly enlarged. Hepatobiliary: No focal liver abnormality is seen. No gallstones, gallbladder wall thickening, or biliary dilatation. Pancreas: Unremarkable. No pancreatic ductal dilatation or surrounding inflammatory changes. Spleen: Normal in size without focal abnormality. Adrenals/Urinary Tract: Left parapelvic renal cysts is identified. Nonobstructing stone is identified in the left kidney. There is no focal obstructing stone or dilatation of left ureter. Small stone is identified  in the right kidney. There is no right hydronephrosis. The bladder is markedly distended and enlarged with thickened bladder wall. There is inflammation surrounding bladder. Stomach/Bowel: Stomach is within normal limits. Appendix is not seen but no inflammation is noted around cecum. No evidence of bowel wall thickening, distention, or inflammatory changes. There is diverticulosis of colon without diverticulitis. Vascular/Lymphatic: Aortic atherosclerosis. No enlarged abdominal or pelvic lymph nodes. Reproductive: Prostate is unremarkable. Other: No  evidence of retroperitoneal or intra-abdominal hemorrhage. Musculoskeletal: Minimal degenerative joint changes of the spine are noted. IMPRESSION: 1. Markedly distended and enlarged bladder with thickened bladder wall and inflammation surrounding bladder. Findings are consistent with cystitis. 2. No evidence of retroperitoneal hemorrhage. Aortic Atherosclerosis (ICD10-I70.0). Electronically Signed   By: Abelardo Diesel M.D.   On: 10/14/2021 10:00   DG Foot Complete Right  Result Date: 10/12/2021 CLINICAL DATA:  Right foot pain EXAM: RIGHT FOOT COMPLETE - 3+ VIEW COMPARISON:  None. FINDINGS: Diffuse bone demineralization. No evidence of acute fracture or dislocation. No focal bone lesion or bone destruction. No radiographic evidence of osteomyelitis. Prominent vascular calcifications. Postoperative intramedullary rod in the tibia. Incompletely visualized. No soft tissue gas or foreign body. IMPRESSION: Diffuse bone demineralization. No acute bony abnormalities. Prominent vascular calcifications. Electronically Signed   By: Lucienne Capers M.D.   On: 10/12/2021 23:51   Medications:  sodium chloride      amLODipine  10 mg Oral Daily   calcium acetate  667 mg Oral TID WC   Chlorhexidine Gluconate Cloth  6 each Topical Q0600   ferrous sulfate  325 mg Oral Q breakfast   insulin aspart  0-6 Units Subcutaneous TID WC   multivitamin with minerals  1 tablet Oral Daily   pantoprazole  40 mg Oral Q0600   polyethylene glycol  17 g Oral BID   senna  1 tablet Oral Daily   sodium chloride flush  3 mL Intravenous Q12H    Dialysis Orders: Center: [facility]  on MWF . 180NRe, 3 hr 15 min, BFR 400, DFR A1.5, EDW 54kg, 2K 2Ca, TDC no heparin Mircera 265mcg IV q HD- last dose was 157mcg on 10/19 Calcium acetate 667mg  1 tab TID with meals Tums 200mg - 2 tabs PO BID  Assessment/Plan:  Symptomatic anemia: GI bleed suspected, GI is on board. Pt currently on PPI, plavix on hold, plan is for EGD and colonoscopy.   ESRD:   Dialysis on MWF schedule, next HD  Monday.  Hypertension/volume: BP moderately elevated. Euvolemic on exam, will likely have some volume onboard after transfusions. Under EDW by weights here. Not on any antihypertensives outpatient  Anemia: Due to #1 above. Recardo Evangelist has been ordered outpatient but not started yet, given aranesp with HD Friday  Metabolic bone disease: Calcium on the high side, noted he takes tums and calcium acetate outpatient. Currently NPO but will resume calcium acetate once he is eating, hold tums for now.   Nutrition:  Currently NPO    Anice Paganini, PA-C 10/14/2021, 10:55 AM  [facility] Pager: [phone]

## 2021-10-14 NOTE — Progress Notes (Signed)
Initial Nutrition Assessment  DOCUMENTATION CODES:   Not applicable  INTERVENTION:   Nepro Shake po TID, each supplement provides 425 kcal and 19 grams protein.  Change MVI to Renal MVI daily.  NUTRITION DIAGNOSIS:   Increased nutrient needs related to chronic illness, wound healing (ESRD on HD, unstageable heel wound) as evidenced by estimated needs.  GOAL:   Patient will meet greater than or equal to 90% of their needs  MONITOR:   PO intake, Supplement acceptance, Labs, Skin  REASON FOR ASSESSMENT:   Consult Wound healing  ASSESSMENT:    70 yo male admitted with symptomatic anemia. PMH includes ESRD on HD, PAD, L BKA, DM, HTN.  S/P transfusion of 2 units of PRBCs on admission.  GI is following. Plans for colonoscopy and EGD this admission if CT AP is negative for bleed.   Patient has a chronic R heel wound, POA  Unable to reach patient by phone or complete NFPE at this time. RD working remotely.  Labs reviewed.  Iron 67 and folate 21.8 WNL. Ferritin 860 (H) CBG: 104  Medications reviewed and include Phoslo, ferrous sulfate, Novolog, MVI with minerals, Protonix, Miralax.  Weight history reviewed. Weight is down by 16% within the last 6 months. Some of this is likely related to left BKA on 05/04/21. However, would not expect to see 16% weight loss from a BKA; would expect much less weight loss (~6.5%).  NUTRITION - FOCUSED PHYSICAL EXAM:  Unable to complete  Diet Order:   Diet Order             Diet renal/carb modified with fluid restriction Fluid restriction: 1200 mL Fluid; Room service appropriate? Yes; Fluid consistency: Thin  Diet effective now                   EDUCATION NEEDS:   Not appropriate for education at this time  Skin:  Skin Assessment: Skin Integrity Issues: Skin Integrity Issues:: Unstageable Unstageable: R heel  Last BM:  12/29  Height:   Ht Readings from Last 1 Encounters:  10/13/21 5\' 6"  (1.676 m)    Weight:   Wt  Readings from Last 1 Encounters:  10/14/21 51.5 kg    Ideal Body Weight:  60.3 kg (adjusted for BKA)   BMI:  Body mass index is 19.6 kg/m (adjusted for BKA)  Estimated Nutritional Needs:   Kcal:  1800-2000  Protein:  75-90 gm  Fluid:  1 L + UOP   Lucas Mallow, RD, LDN, CNSC Please refer to Amion for contact information.

## 2021-10-14 NOTE — Progress Notes (Addendum)
Patient ID: [facility], male   DOB: 07-21-1951, 70 y.o.   MRN: [id number]    Progress Note   Subjective  Day # 2 CC: normocytic anemia in setting of chronic Eliquis-6 g drop in hemoglobin over the past 2 months  Patient has no current complaints, no complaints of abdominal discomfort, no melena or hematochezia. He is complaining of pain in his right lower extremity/foot and heel and is unable to find a comfortable position Labs today hemoglobin 7.8/hematocrit 22.7 stable post transfusions BUN 17/creatinine 4.7  CT abdomen pelvis without contrast shows markedly distended and enlarged bladder with thickened bladder wall and inflammation surrounding the bladder consistent with cystitis otherwise unremarkable  Last Eliquis 10/13/2019   Objective   Vital signs in last 24 hours: Temp:  [97.9 F (36.6 C)-98.9 F (37.2 C)] 98.3 F (36.8 C) (12/31 1044) Pulse Rate:  [74-86] 79 (12/31 1044) Resp:  [16-20] 18 (12/31 1044) BP: (122-161)/(42-78) 160/75 (12/31 1044) SpO2:  [98 %-100 %] 100 % (12/31 1044) Weight:  [51.5 kg-54.4 kg] 51.5 kg (12/31 0451) Last BM Date: 10/12/21 General:    Chronically ill-appearing older African-American male in NAD Heart:  Regular rate and rhythm; no murmurs Lungs: Respirations even and unlabored, lungs CTA bilaterally Abdomen:  Soft, nontender and nondistended. Normal bowel sounds. Extremities: Status post left BKA, right heel bandaged Neurologic:  Alert and oriented,  grossly normal neurologically. Psych:  Cooperative. Normal mood and affect.  Intake/Output from previous day: 12/30 0701 - 12/31 0700 In: 552 [P.O.:237; Blood:315] Out: 1746  Intake/Output this shift: Total I/O In: -  Out: 500 [Urine:500]  Lab Results: Recent Labs    10/12/21 2231 10/13/21 1606 10/14/21 0328  WBC 9.3 9.5 7.5  HGB 6.6* 7.4* 7.8*  HCT 20.6* 22.5* 22.7*  PLT 263 234 219   BMET Recent Labs    10/12/21 2231 10/14/21 0328  NA 138 137  K 4.3 3.9  CL 95*  98  CO2 32 29  GLUCOSE 161* 111*  BUN 21 17  CREATININE 5.62* 4.71*  CALCIUM 9.1 8.9   LFT Recent Labs    10/12/21 2231  PROT 8.6*  ALBUMIN 3.3*  AST 24  ALT 15  ALKPHOS 79  BILITOT 0.5   PT/INR Recent Labs    10/13/21 1300  LABPROT 14.5  INR 1.1    Studies/Results: CT ABDOMEN PELVIS WO CONTRAST  Result Date: 10/14/2021 CLINICAL DATA:  6 gram drop in hemoglobin. Assess for intra-abdominal or retroperitoneal bleed. EXAM: CT ABDOMEN AND PELVIS WITHOUT CONTRAST TECHNIQUE: Multidetector CT imaging of the abdomen and pelvis was performed following the standard protocol without IV contrast. COMPARISON:  October 07, 2011 FINDINGS: Lower chest: Mild atelectasis of posterior lung bases are noted. The heart size is mildly enlarged. Hepatobiliary: No focal liver abnormality is seen. No gallstones, gallbladder wall thickening, or biliary dilatation. Pancreas: Unremarkable. No pancreatic ductal dilatation or surrounding inflammatory changes. Spleen: Normal in size without focal abnormality. Adrenals/Urinary Tract: Left parapelvic renal cysts is identified. Nonobstructing stone is identified in the left kidney. There is no focal obstructing stone or dilatation of left ureter. Small stone is identified in the right kidney. There is no right hydronephrosis. The bladder is markedly distended and enlarged with thickened bladder wall. There is inflammation surrounding bladder. Stomach/Bowel: Stomach is within normal limits. Appendix is not seen but no inflammation is noted around cecum. No evidence of bowel wall thickening, distention, or inflammatory changes. There is diverticulosis of colon without diverticulitis. Vascular/Lymphatic: Aortic atherosclerosis. No enlarged abdominal  or pelvic lymph nodes. Reproductive: Prostate is unremarkable. Other: No evidence of retroperitoneal or intra-abdominal hemorrhage. Musculoskeletal: Minimal degenerative joint changes of the spine are noted. IMPRESSION: 1.  Markedly distended and enlarged bladder with thickened bladder wall and inflammation surrounding bladder. Findings are consistent with cystitis. 2. No evidence of retroperitoneal hemorrhage. Aortic Atherosclerosis (ICD10-I70.0). Electronically Signed   By: Abelardo Diesel M.D.   On: 10/14/2021 10:00   DG Foot Complete Right  Result Date: 10/12/2021 CLINICAL DATA:  Right foot pain EXAM: RIGHT FOOT COMPLETE - 3+ VIEW COMPARISON:  None. FINDINGS: Diffuse bone demineralization. No evidence of acute fracture or dislocation. No focal bone lesion or bone destruction. No radiographic evidence of osteomyelitis. Prominent vascular calcifications. Postoperative intramedullary rod in the tibia. Incompletely visualized. No soft tissue gas or foreign body. IMPRESSION: Diffuse bone demineralization. No acute bony abnormalities. Prominent vascular calcifications. Electronically Signed   By: Lucienne Capers M.D.   On: 10/12/2021 23:51       Assessment / Plan:    #42 70 year old African-American male with new profound normocytic anemia, not iron deficient with 6 g drop in hemoglobin over the past 2 months in setting of chronic Eliquis Heme-negative on admission.  No iron deficiency Hemoglobin stable after transfusions CT abdomen and pelvis negative for any evidence of retroperitoneal or intraperitoneal bleed No prior endoscopic evaluation  Will plan for EGD and colonoscopy this admission after 48-hour Eliquis washout  #2 end-stage renal disease on hemodialysis-Monday Wednesday Friday #3 hypertension #4 adult onset diabetes mellitus #5 status post left BKA-now with chronic right foot pain #6 history of hypercoagulable state for which she is on Eliquis-off Eliquis since 10/12/2021-okay from GI perspective if needs to be heparinized but will need to hold prior to procedure   Plan:  Regular diet today Full liquid for breakfast then clear liquids Plan for bowel prep tomorrow Continue to trend hemoglobin and  transfuse to keep hemoglobin above 7 Hold Eliquis-okay to heparinize if felt indicated but will need to hold for 6 hours prior to procedure We will tentatively schedule for colonoscopy and EGD on Monday with Dr. Fuller Plan.  Both procedures were discussed in detail with patient including indications risks and benefits and he is agreeable to proceed    LOS: 1 day   Amy Esterwood PA-C 10/14/2021, 4:04 PM    Attending Physician Note   I have taken an interval history, reviewed the chart and examined the patient. I personally saw the patient and performed a substantive portion of this encounter, including a complete performance of at least one of the key components, in conjunction with the APP. I agree with the APP's note, impression and recommendations.   Lucio Edward, MD [facility] See AMION, [facility], for our on call provider

## 2021-10-15 ENCOUNTER — Encounter (HOSPITAL_COMMUNITY): Payer: No Typology Code available for payment source

## 2021-10-15 DIAGNOSIS — D649 Anemia, unspecified: Secondary | ICD-10-CM | POA: Diagnosis not present

## 2021-10-15 DIAGNOSIS — Z7901 Long term (current) use of anticoagulants: Secondary | ICD-10-CM | POA: Diagnosis not present

## 2021-10-15 LAB — CBC
HCT: 22.9 % — ABNORMAL LOW (ref 39.0–52.0)
Hemoglobin: 7.5 g/dL — ABNORMAL LOW (ref 13.0–17.0)
MCH: 30.9 pg (ref 26.0–34.0)
MCHC: 32.8 g/dL (ref 30.0–36.0)
MCV: 94.2 fL (ref 80.0–100.0)
Platelets: 218 10*3/uL (ref 150–400)
RBC: 2.43 MIL/uL — ABNORMAL LOW (ref 4.22–5.81)
RDW: 18.3 % — ABNORMAL HIGH (ref 11.5–15.5)
WBC: 8.6 10*3/uL (ref 4.0–10.5)
nRBC: 0 % (ref 0.0–0.2)

## 2021-10-15 LAB — GLUCOSE, CAPILLARY
Glucose-Capillary: 147 mg/dL — ABNORMAL HIGH (ref 70–99)
Glucose-Capillary: 210 mg/dL — ABNORMAL HIGH (ref 70–99)
Glucose-Capillary: 139 mg/dL — ABNORMAL HIGH (ref 70–99)

## 2021-10-15 LAB — URINE CULTURE: Culture: NO GROWTH

## 2021-10-15 MED ORDER — PEG-KCL-NACL-NASULF-NA ASC-C 100 G PO SOLR
0.5000 | Freq: Once | ORAL | Status: DC
  Filled 2021-10-15: qty 1

## 2021-10-15 MED ORDER — DARBEPOETIN ALFA 100 MCG/0.5ML IJ SOSY: 100.0000 ug | PREFILLED_SYRINGE | INTRAMUSCULAR | Status: DC

## 2021-10-15 MED ORDER — PEG-KCL-NACL-NASULF-NA ASC-C 100 G PO SOLR
0.5000 | Freq: Once | ORAL | Status: AC
  Administered 2021-10-16: 100 g via ORAL
  Filled 2021-10-15: qty 1

## 2021-10-15 MED ORDER — PEG-KCL-NACL-NASULF-NA ASC-C 100 G PO SOLR: 1.0000 | Freq: Once | ORAL | Status: DC

## 2021-10-15 MED ORDER — CHLORHEXIDINE GLUCONATE CLOTH 2 % EX PADS
6.0000 | MEDICATED_PAD | Freq: Every day | CUTANEOUS | Status: DC
  Administered 2021-10-15 – 2021-10-23 (×7): 6 via TOPICAL

## 2021-10-15 NOTE — Progress Notes (Addendum)
Patient ID: [facility], male   DOB: 07/03/1951, 71 y.o.   MRN: [id number]    Progress Note   Subjective  Day # 3 CC: normocytic anemia in setting of Eliquis-6 g drop in hemoglobin over 2 months  CT abdomen / pelvis showed markedly distended and enlarged bladder with thickened bladder wall and surrounding inflammation consistent with cystitis otherwise unremarkable no evidence for retroperitoneal bleed or intra-abdominal hematoma  Labs today-WBC 8.6/hemoglobin 7.5/hematocrit 22.9 stable  Eliquis on hold Lower extremity Doppler pending-rule out DVT, recent DVT diagnosed 08/13/2021  Patient has no complaints today, resting comfortably in bed, has been having a lot of ongoing right lower extremity/foot pain-MRI pending    Objective   Vital signs in last 24 hours: Temp:  [98 F (36.7 C)-98.3 F (36.8 C)] 98 F (36.7 C) (01/01 0918) Pulse Rate:  [75-85] 75 (01/01 0918) Resp:  [17-18] 17 (01/01 0918) BP: (123-158)/(63-76) 135/63 (01/01 0918) SpO2:  [99 %-100 %] 99 % (01/01 0918) Last BM Date: 10/12/21 General: Older male in NAD Heart:  Regular rate and rhythm; no murmurs Lungs: Respirations even and unlabored, lungs CTA bilaterally Abdomen:  Soft, nontender and nondistended. Normal bowel sounds. Extremities: Status post left BKA now with chronic right foot pain, failed ABIs and MRI pending Neurologic:  Alert and oriented,  grossly normal neurologically. Psych:  Cooperative. Normal mood and affect.  Intake/Output from previous day: 12/31 0701 - 01/01 0700 In: 754 [P.O.:654; IV Piggyback:100] Out: 700 [Urine:700] Intake/Output this shift: Total I/O In: 733 [P.O.:733] Out: -   Lab Results: Recent Labs    10/13/21 1606 10/14/21 0328 10/15/21 0407  WBC 9.5 7.5 8.6  HGB 7.4* 7.8* 7.5*  HCT 22.5* 22.7* 22.9*  PLT 234 219 218   BMET Recent Labs    10/12/21 2231 10/14/21 0328  NA 138 137  K 4.3 3.9  CL 95* 98  CO2 32 29  GLUCOSE 161* 111*  BUN 21 17   CREATININE 5.62* 4.71*  CALCIUM 9.1 8.9   LFT Recent Labs    10/12/21 2231  PROT 8.6*  ALBUMIN 3.3*  AST 24  ALT 15  ALKPHOS 79  BILITOT 0.5   PT/INR Recent Labs    10/13/21 1300  LABPROT 14.5  INR 1.1    Studies/Results: CT ABDOMEN PELVIS WO CONTRAST  Result Date: 10/14/2021 CLINICAL DATA:  6 gram drop in hemoglobin. Assess for intra-abdominal or retroperitoneal bleed. EXAM: CT ABDOMEN AND PELVIS WITHOUT CONTRAST TECHNIQUE: Multidetector CT imaging of the abdomen and pelvis was performed following the standard protocol without IV contrast. COMPARISON:  October 07, 2011 FINDINGS: Lower chest: Mild atelectasis of posterior lung bases are noted. The heart size is mildly enlarged. Hepatobiliary: No focal liver abnormality is seen. No gallstones, gallbladder wall thickening, or biliary dilatation. Pancreas: Unremarkable. No pancreatic ductal dilatation or surrounding inflammatory changes. Spleen: Normal in size without focal abnormality. Adrenals/Urinary Tract: Left parapelvic renal cysts is identified. Nonobstructing stone is identified in the left kidney. There is no focal obstructing stone or dilatation of left ureter. Small stone is identified in the right kidney. There is no right hydronephrosis. The bladder is markedly distended and enlarged with thickened bladder wall. There is inflammation surrounding bladder. Stomach/Bowel: Stomach is within normal limits. Appendix is not seen but no inflammation is noted around cecum. No evidence of bowel wall thickening, distention, or inflammatory changes. There is diverticulosis of colon without diverticulitis. Vascular/Lymphatic: Aortic atherosclerosis. No enlarged abdominal or pelvic lymph nodes. Reproductive: Prostate is unremarkable. Other: No  evidence of retroperitoneal or intra-abdominal hemorrhage. Musculoskeletal: Minimal degenerative joint changes of the spine are noted. IMPRESSION: 1. Markedly distended and enlarged bladder with  thickened bladder wall and inflammation surrounding bladder. Findings are consistent with cystitis. 2. No evidence of retroperitoneal hemorrhage. Aortic Atherosclerosis (ICD10-I70.0). Electronically Signed   By: Abelardo Diesel M.D.   On: 10/14/2021 10:00       Assessment / Plan:    #13  71 year old African-American male with new profound normocytic anemia, not iron deficient, heme-negative on admission and 6 g off in hemoglobin over 2 months in setting of new initiation of Eliquis  CT abdomen / pelvis negative on admission for retroperitoneal / intraperitoneal bleed Plan is for colonoscopy and EGD this admission off Eliquis.  #2 end-stage renal disease on hemodialysis Monday Wednesday Friday #3 hypertension #4  Adult onset diabetes mellitus #5  History of hypercoagulable state #6 status post left BKA, probable chronic right foot ischemia-work-up in progress  Plan:  Allow regular diet today We are unable to do colonoscopy/EGD tomorrow due to anesthesia limitations Will plan for bowel prep on Monday along with dialysis on Monday and then endoscopic evaluation with EGD and colonoscopy on Tuesday Off Eliquis until post procedures If heparinization is required need to stop for 6 hours prior to procedures.    LOS: 2 days   Amy Esterwood PA-c 10/15/2021, 2:13 PM    Attending Physician Note   I have taken an interval history, reviewed the chart and examined the patient. I personally saw the patient and performed more than 50% of this encounter in conjunction with the APP. I agree with the APP's note, impression and recommendations. My additional impressions and recommendations are as follows.   Lucio Edward, MD [facility] See AMION, [facility], for our on call provider

## 2021-10-15 NOTE — Progress Notes (Signed)
[facility] Progress Note   Subjective:   Reports he is tired this morning. Denies SOB, CP, palpitations, dizziness, and abdominal pain.   Objective Vitals:   10/14/21 1801 10/14/21 2150 10/15/21 0538 10/15/21 0918  BP: 123/64 (!) 158/74 (!) 153/76 135/63  Pulse: 82 85 82 75  Resp: 18 18 18 17   Temp:  98.3 F (36.8 C) 98.1 F (36.7 C) 98 F (36.7 C)  TempSrc:  Oral Oral   SpO2: 100% 100% 100% 99%  Weight:      Height:       Physical Exam General: WDWN male, alert and in NAD Heart: RRR, no murmurs Lungs: CTA anteriorly without wheezing, rhonchi or rales Abdomen: Soft, non-tender, non-distended, +BS Extremities: No edema b/l LE. L BKA Dialysis Access: [facility] with dry intact dressing  Additional Objective Labs: Basic Metabolic Panel: Recent Labs  Lab 10/12/21 2231 10/14/21 0328  NA 138 137  K 4.3 3.9  CL 95* 98  CO2 32 29  GLUCOSE 161* 111*  BUN 21 17  CREATININE 5.62* 4.71*  CALCIUM 9.1 8.9   Liver Function Tests: Recent Labs  Lab 10/12/21 2231  AST 24  ALT 15  ALKPHOS 79  BILITOT 0.5  PROT 8.6*  ALBUMIN 3.3*   No results for input(s): LIPASE, AMYLASE in the last 168 hours. CBC: Recent Labs  Lab 10/12/21 2231 10/13/21 1606 10/14/21 0328 10/15/21 0407  WBC 9.3 9.5 7.5 8.6  NEUTROABS 6.3  --   --   --   HGB 6.6* 7.4* 7.8* 7.5*  HCT 20.6* 22.5* 22.7* 22.9*  MCV 95.8 93.0 93.4 94.2  PLT 263 234 219 218   Blood Culture    Component Value Date/Time   SDES URINE, CLEAN CATCH 10/14/2021 1040   SPECREQUEST NONE 10/14/2021 1040   CULT  10/14/2021 1040    NO GROWTH Performed at [facility], [place] [address], [place] [address]    REPTSTATUS 10/15/2021 FINAL 10/14/2021 1040    Cardiac Enzymes: No results for input(s): CKTOTAL, CKMB, CKMBINDEX, TROPONINI in the last 168 hours. CBG: Recent Labs  Lab 10/14/21 0633 10/14/21 1157 10/14/21 1713 10/14/21 2151 10/15/21 0657  GLUCAP 104* 116* 176* 96 147*   Iron  Studies:  Recent Labs    10/13/21 0822  IRON 67  TIBC 217*  FERRITIN 860*   @lablastinr3 @ Studies/Results: CT ABDOMEN PELVIS WO CONTRAST  Result Date: 10/14/2021 CLINICAL DATA:  6 gram drop in hemoglobin. Assess for intra-abdominal or retroperitoneal bleed. EXAM: CT ABDOMEN AND PELVIS WITHOUT CONTRAST TECHNIQUE: Multidetector CT imaging of the abdomen and pelvis was performed following the standard protocol without IV contrast. COMPARISON:  October 07, 2011 FINDINGS: Lower chest: Mild atelectasis of posterior lung bases are noted. The heart size is mildly enlarged. Hepatobiliary: No focal liver abnormality is seen. No gallstones, gallbladder wall thickening, or biliary dilatation. Pancreas: Unremarkable. No pancreatic ductal dilatation or surrounding inflammatory changes. Spleen: Normal in size without focal abnormality. Adrenals/Urinary Tract: Left parapelvic renal cysts is identified. Nonobstructing stone is identified in the left kidney. There is no focal obstructing stone or dilatation of left ureter. Small stone is identified in the right kidney. There is no right hydronephrosis. The bladder is markedly distended and enlarged with thickened bladder wall. There is inflammation surrounding bladder. Stomach/Bowel: Stomach is within normal limits. Appendix is not seen but no inflammation is noted around cecum. No evidence of bowel wall thickening, distention, or inflammatory changes. There is diverticulosis of colon without diverticulitis. Vascular/Lymphatic: Aortic atherosclerosis.  No enlarged abdominal or pelvic lymph nodes. Reproductive: Prostate is unremarkable. Other: No evidence of retroperitoneal or intra-abdominal hemorrhage. Musculoskeletal: Minimal degenerative joint changes of the spine are noted. IMPRESSION: 1. Markedly distended and enlarged bladder with thickened bladder wall and inflammation surrounding bladder. Findings are consistent with cystitis. 2. No evidence of retroperitoneal  hemorrhage. Aortic Atherosclerosis (ICD10-I70.0). Electronically Signed   By: Abelardo Diesel M.D.   On: 10/14/2021 10:00   Medications:  sodium chloride      amLODipine  10 mg Oral Daily   calcium acetate  667 mg Oral TID WC   Chlorhexidine Gluconate Cloth  6 each Topical Q0600   [START ON 10/20/2021] darbepoetin (ARANESP) injection - DIALYSIS  100 mcg Intravenous Q Fri-HD   feeding supplement (NEPRO CARB STEADY)  237 mL Oral TID BM   ferrous sulfate  325 mg Oral Q breakfast   insulin aspart  0-6 Units Subcutaneous TID WC   multivitamin  1 tablet Oral QHS   pantoprazole (PROTONIX) IV  40 mg Intravenous Q12H   polyethylene glycol  17 g Oral BID   senna  1 tablet Oral Daily   sodium chloride flush  3 mL Intravenous Q12H    Dialysis Orders: Center: [facility]  on MWF . 180NRe, 3 hr 15 min, BFR 400, DFR A1.5, EDW 54kg, 2K 2Ca, TDC no heparin Mircera 284mcg IV q HD- last dose was 170mcg on 10/19 Calcium acetate 667mg  1 tab TID with meals Tums 200mg - 2 tabs PO BID    Assessment/Plan: Symptomatic anemia: GI bleed suspected, GI is on board. Pt currently on PPI, plavix on hold, plan is for EGD and colonoscopy.   ESRD:  Dialysis on MWF schedule, next HD  Monday.  Hypertension/volume: BP moderately elevated. Euvolemic on exam. Under EDW by weights here. Not on any antihypertensives outpatient. UFG 1L with HD tomorrow.   Anemia: Due to #1 above. Recardo Evangelist has been ordered outpatient but not started yet, given aranesp with HD Friday  Metabolic bone disease: Calcium on the high side, noted he takes tums and calcium acetate outpatient. Resumed calcium acetate once he is eating, hold tums for now.   Nutrition:  Currently on clear liquids per GI    Anice Paganini, PA-C 10/15/2021, 11:01 AM  [facility] Pager: [phone]

## 2021-10-15 NOTE — Progress Notes (Signed)
PROGRESS NOTE    Roger Vazquez  URK:[id number] DOB: 11/14/1950 DOA: 10/12/2021 PCP: [facility], [facility]   Brief Narrative: 71 year old with past medical history significant for ESRD on hemodialysis MWF, PAD status post left BKA, diabetes, hypertension presented with a hemoglobin of 6.4, lab work performed at skilled nursing facility.  Reported feeling weak and tired.  He does not know if he has had bloody stool.  Patient admitted with anemia, hemoglobin a month ago was 12. He was admitted for further evaluation of  anemia.  GI has been consulted and plan is for endoscopy colonoscopy.   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  GI consulted, planning endoscopy, colonoscopy tomorrow.  Received one unit PRBC>  Hb 6.6---7.4--7.8--7.5 IV Protonix.   DVT:  Patient with a prior history of DVT  diagnosed 08/13/2021. He has been on Eliquis, continue to hold for now. Plan to check doppler if still positive for DVT, will start Heparin Gtt.   Heel ulcer, right with unspecified severity He has been followed by vascular.  He had failed ABIs recently with inadequate perfusion to promote wound healing. MRI: pending.  He follows with Dr Roger Vazquez, he is suppose to get arteriogram and possible intervention in 1-2 weeks. Will send message to Dr Roger Vazquez tomorrow or Tuesday after holidays. Marland Kitchen   ESRD  on hemodialysis: Nephrology Consulted and following  Hypertension: Continue with Norvasc  Type 2 diabetes with chronic kidney disease on chronic dialysis: Prior A1c 7.9 Continue With SSI   Markedly distended  and enlarged bladder with thickened bladder wall and inflammation surrounding bladder. Findings are consistent with cystitis. In and out cath perform yield 500 cc urine.  UA with large leukocyte.  Received one dose of ceftriaxone. Urine culture no growth.   See wound care documentation below.   Pressure Injury 10/13/21 Right Unstageable - Full thickness tissue loss in which the base of the injury is covered by slough (yellow, tan, gray, green or brown) and/or eschar (tan, brown or black) in the wound bed. (Active)  10/13/21   Location:   Location Orientation: Right  Staging: Unstageable - Full thickness tissue loss in which the base of the injury is covered by slough (yellow, tan, gray, green or brown) and/or eschar (tan, brown or black) in the wound bed.  Wound Description (Comments):   Present on Admission: Yes      Estimated body mass index is 18.33 kg/m as calculated from the following:   Height as of this encounter: 5\' 6"  (1.676 m).   Weight as of this encounter: 51.5 kg.   DVT prophylaxis: SCD Code Status: Full code Family Communication: sister  12/31 Disposition Plan:  Status is: Inpatient  Remains inpatient appropriate because: evaluation for anemia         Consultants:  GI Nephrology   Procedures:    Antimicrobials:  Ceftriaxone   Subjective: He is alert, denies pain. Eating breakfast   Objective: Vitals:   10/14/21 1801 10/14/21 2150 10/15/21 0538 10/15/21 0918  BP: 123/64 (!) 158/74 (!) 153/76 135/63  Pulse: 82 85 82 75  Resp: 18 18 18 17   Temp:  98.3  F (36.8 C) 98.1 F (36.7 C) 98 F (36.7 C)  TempSrc:  Oral Oral   SpO2: 100% 100% 100% 99%  Weight:      Height:        Intake/Output Summary (Last 24 hours) at 10/15/2021 1307 Last data filed at 10/15/2021 1100 Gross per 24 hour  Intake 1250 ml  Output 200 ml  Net 1050 ml    Filed Weights   10/13/21 1610 10/13/21 1810 10/14/21 0451  Weight: 54.4 kg 51.5 kg 51.5 kg     Examination:  General exam: NAD Respiratory system: CTA Cardiovascular system: S 1, S 2 RRR Gastrointestinal system: BS present, soft, nt Central nervous system: Alert Extremities: Right heel with dressing  Data Reviewed: I have personally reviewed following labs and imaging studies  CBC: Recent Labs  Lab 10/12/21 2231 10/13/21 1606 10/14/21 0328 10/15/21 0407  WBC 9.3 9.5 7.5 8.6  NEUTROABS 6.3  --   --   --   HGB 6.6* 7.4* 7.8* 7.5*  HCT 20.6* 22.5* 22.7* 22.9*  MCV 95.8 93.0 93.4 94.2  PLT 263 234 219 353    Basic Metabolic Panel: Recent Labs  Lab 10/12/21 2231 10/14/21 0328  NA 138 137  K 4.3 3.9  CL 95* 98  CO2 32 29  GLUCOSE 161* 111*  BUN 21 17  CREATININE 5.62* 4.71*  CALCIUM 9.1 8.9    GFR: Estimated Creatinine Clearance: 10.5 mL/min (A) (by C-G formula based on SCr of 4.71 mg/dL (H)). Liver Function Tests: Recent Labs  Lab 10/12/21 2231  AST 24  ALT 15  ALKPHOS 79  BILITOT 0.5  PROT 8.6*  ALBUMIN 3.3*    No results for input(s): LIPASE, AMYLASE in the last 168 hours. No results for input(s): AMMONIA in the last 168 hours. Coagulation Profile: Recent Labs  Lab 10/13/21 1300  INR 1.1    Cardiac Enzymes: No results for input(s): CKTOTAL, CKMB, CKMBINDEX, TROPONINI in the last 168 hours. BNP (last 3 results) No results for input(s): PROBNP in the last 8760 hours. HbA1C: No results for input(s): HGBA1C in the last 72 hours. CBG: Recent Labs  Lab 10/14/21 1157 10/14/21 1713 10/14/21 2151 10/15/21 0657 10/15/21 1123  GLUCAP 116* 176* 96 147* 210*    Lipid Profile: No results for input(s): CHOL, HDL, LDLCALC, TRIG, CHOLHDL, LDLDIRECT in the last 72 hours. Thyroid Function Tests: No results for input(s): TSH, T4TOTAL, FREET4, T3FREE, THYROIDAB in the last 72 hours. Anemia Panel: Recent Labs    10/13/21 0822  VITAMINB12 700  FOLATE 21.8  FERRITIN 860*  TIBC 217*  IRON 67  RETICCTPCT 1.9    Sepsis Labs: No results  for input(s): PROCALCITON, LATICACIDVEN in the last 168 hours.  Recent Results (from the past 240 hour(s))  Resp Panel by RT-PCR (Flu A&B, Covid) Nasopharyngeal Swab     Status: None   Collection Time: 10/13/21  5:00 AM   Specimen: Nasopharyngeal Swab; Nasopharyngeal(NP) swabs in vial transport medium  Result Value Ref Range Status   SARS Coronavirus 2 by RT PCR NEGATIVE NEGATIVE Final    Comment: (NOTE) SARS-CoV-2 target nucleic acids are NOT DETECTED.  The SARS-CoV-2 RNA is generally detectable in upper respiratory specimens during the acute phase of infection. The lowest concentration of SARS-CoV-2 viral copies this assay can detect is 138 copies/mL. A negative result does not preclude SARS-Cov-2 infection and should not be used as the sole basis for treatment or other patient management decisions. A negative result may occur with  improper  specimen collection/handling, submission of specimen other than nasopharyngeal swab, presence of viral mutation(s) within the areas targeted by this assay, and inadequate number of viral copies(<138 copies/mL). A negative result must be combined with clinical observations, patient history, and epidemiological information. The expected result is Negative.  Fact Sheet for Patients:  EntrepreneurPulse.com.au  Fact Sheet for Healthcare Providers:  IncredibleEmployment.be  This test is no t yet approved or cleared by the [place] FDA and  has been authorized for detection and/or diagnosis of SARS-CoV-2 by FDA under an Emergency Use Authorization (EUA). This EUA will remain  in effect (meaning this test can be used) for the duration of the COVID-19 declaration under Section 564(b)(1) of the Act, 21 U.S.C.section 360bbb-3(b)(1), unless the authorization is terminated  or revoked sooner.       Influenza A by PCR NEGATIVE NEGATIVE Final   Influenza B by PCR NEGATIVE NEGATIVE Final    Comment: (NOTE) The  Xpert Xpress SARS-CoV-2/FLU/RSV plus assay is intended as an aid in the diagnosis of influenza from Nasopharyngeal swab specimens and should not be used as a sole basis for treatment. Nasal washings and aspirates are unacceptable for Xpert Xpress SARS-CoV-2/FLU/RSV testing.  Fact Sheet for Patients: EntrepreneurPulse.com.au  Fact Sheet for Healthcare Providers: IncredibleEmployment.be  This test is not yet approved or cleared by the [place] FDA and has been authorized for detection and/or diagnosis of SARS-CoV-2 by FDA under an Emergency Use Authorization (EUA). This EUA will remain in effect (meaning this test can be used) for the duration of the COVID-19 declaration under Section 564(b)(1) of the Act, 21 U.S.C. section 360bbb-3(b)(1), unless the authorization is terminated or revoked.  Performed at [facility], [place] [address], [place], [place] [address]   Urine Culture     Status: None   Collection Time: 10/14/21 10:40 AM   Specimen: Urine, Clean Catch  Result Value Ref Range Status   Specimen Description URINE, CLEAN CATCH  Final   Special Requests NONE  Final   Culture   Final    NO GROWTH Performed at [facility], [place] [address], [place], [place] [address]    Report Status 10/15/2021 FINAL  Final          Radiology Studies: CT ABDOMEN PELVIS WO CONTRAST  Result Date: 10/14/2021 CLINICAL DATA:  6 gram drop in hemoglobin. Assess for intra-abdominal or retroperitoneal bleed. EXAM: CT ABDOMEN AND PELVIS WITHOUT CONTRAST TECHNIQUE: Multidetector CT imaging of the abdomen and pelvis was performed following the standard protocol without IV contrast. COMPARISON:  October 07, 2011 FINDINGS: Lower chest: Mild atelectasis of posterior lung bases are noted. The heart size is mildly enlarged. Hepatobiliary: No focal liver abnormality is seen. No gallstones, gallbladder wall thickening, or biliary dilatation. Pancreas:  Unremarkable. No pancreatic ductal dilatation or surrounding inflammatory changes. Spleen: Normal in size without focal abnormality. Adrenals/Urinary Tract: Left parapelvic renal cysts is identified. Nonobstructing stone is identified in the left kidney. There is no focal obstructing stone or dilatation of left ureter. Small stone is identified in the right kidney. There is no right hydronephrosis. The bladder is markedly distended and enlarged with thickened bladder wall. There is inflammation surrounding bladder. Stomach/Bowel: Stomach is within normal limits. Appendix is not seen but no inflammation is noted around cecum. No evidence of bowel wall thickening, distention, or inflammatory changes. There is diverticulosis of colon without diverticulitis. Vascular/Lymphatic: Aortic atherosclerosis. No enlarged abdominal or pelvic lymph nodes. Reproductive: Prostate is unremarkable. Other: No evidence of retroperitoneal or  intra-abdominal hemorrhage. Musculoskeletal: Minimal degenerative joint changes of the spine are noted. IMPRESSION: 1. Markedly distended and enlarged bladder with thickened bladder wall and inflammation surrounding bladder. Findings are consistent with cystitis. 2. No evidence of retroperitoneal hemorrhage. Aortic Atherosclerosis (ICD10-I70.0). Electronically Signed   By: Abelardo Diesel M.D.   On: 10/14/2021 10:00        Scheduled Meds:  amLODipine  10 mg Oral Daily   calcium acetate  667 mg Oral TID WC   Chlorhexidine Gluconate Cloth  6 each Topical Q0600   [START ON 10/20/2021] darbepoetin (ARANESP) injection - DIALYSIS  100 mcg Intravenous Q Fri-HD   feeding supplement (NEPRO CARB STEADY)  237 mL Oral TID BM   ferrous sulfate  325 mg Oral Q breakfast   insulin aspart  0-6 Units Subcutaneous TID WC   multivitamin  1 tablet Oral QHS   pantoprazole (PROTONIX) IV  40 mg Intravenous Q12H   polyethylene glycol  17 g Oral BID   senna  1 tablet Oral Daily   sodium chloride flush  3 mL  Intravenous Q12H   Continuous Infusions:  sodium chloride       LOS: 2 days    Time spent: 35 minutes.     Elmarie Shiley, MD [facility]   If 7PM-7AM, please contact night-coverage [email or web address]  10/15/2021, 1:07 PM

## 2021-10-16 ENCOUNTER — Inpatient Hospital Stay (HOSPITAL_COMMUNITY): Payer: No Typology Code available for payment source

## 2021-10-16 DIAGNOSIS — Z7901 Long term (current) use of anticoagulants: Secondary | ICD-10-CM | POA: Diagnosis not present

## 2021-10-16 DIAGNOSIS — I82402 Acute embolism and thrombosis of unspecified deep veins of left lower extremity: Secondary | ICD-10-CM

## 2021-10-16 DIAGNOSIS — D649 Anemia, unspecified: Secondary | ICD-10-CM | POA: Diagnosis not present

## 2021-10-16 LAB — HEPARIN LEVEL (UNFRACTIONATED): Heparin Unfractionated: 0.88 IU/mL — ABNORMAL HIGH (ref 0.30–0.70)

## 2021-10-16 LAB — CBC
HCT: 23.9 % — ABNORMAL LOW (ref 39.0–52.0)
Hemoglobin: 8 g/dL — ABNORMAL LOW (ref 13.0–17.0)
MCH: 31.9 pg (ref 26.0–34.0)
MCHC: 33.5 g/dL (ref 30.0–36.0)
MCV: 95.2 fL (ref 80.0–100.0)
Platelets: 219 10*3/uL (ref 150–400)
RBC: 2.51 MIL/uL — ABNORMAL LOW (ref 4.22–5.81)
RDW: 18.3 % — ABNORMAL HIGH (ref 11.5–15.5)
WBC: 9 10*3/uL (ref 4.0–10.5)
nRBC: 0 % (ref 0.0–0.2)

## 2021-10-16 LAB — HEMOGLOBIN AND HEMATOCRIT, BLOOD
HCT: 28.3 % — ABNORMAL LOW (ref 39.0–52.0)
Hemoglobin: 9.3 g/dL — ABNORMAL LOW (ref 13.0–17.0)

## 2021-10-16 LAB — GLUCOSE, CAPILLARY
Glucose-Capillary: 153 mg/dL — ABNORMAL HIGH (ref 70–99)
Glucose-Capillary: 158 mg/dL — ABNORMAL HIGH (ref 70–99)
Glucose-Capillary: 156 mg/dL — ABNORMAL HIGH (ref 70–99)
Glucose-Capillary: 114 mg/dL — ABNORMAL HIGH (ref 70–99)

## 2021-10-16 MED ORDER — LIP MEDEX EX OINT
TOPICAL_OINTMENT | CUTANEOUS | Status: DC | PRN
  Administered 2021-10-16: 75 via TOPICAL
  Filled 2021-10-16: qty 7

## 2021-10-16 MED ORDER — OXYCODONE HCL 5 MG PO TABS
5.0000 mg | ORAL_TABLET | Freq: Four times a day (QID) | ORAL | Status: DC | PRN
  Administered 2021-10-16 – 2021-10-24 (×17): 5 mg via ORAL
  Filled 2021-10-16 (×19): qty 1

## 2021-10-16 MED ORDER — OXYCODONE HCL 5 MG PO TABS
5.0000 mg | ORAL_TABLET | Freq: Once | ORAL | Status: AC
  Administered 2021-10-16: 5 mg via ORAL
  Filled 2021-10-16: qty 1

## 2021-10-16 MED ORDER — HEPARIN (PORCINE) 25000 UT/250ML-% IV SOLN
600.0000 [IU]/h | INTRAVENOUS | Status: DC
  Administered 2021-10-16: 600 [IU]/h via INTRAVENOUS
  Filled 2021-10-16: qty 250

## 2021-10-16 NOTE — Progress Notes (Addendum)
Patient ID: Roger Vazquez, male   DOB: [birth year], 71 y.o.   MRN: [id number]    Progress Note   Subjective  Day # 4 CC: normocytic anemia in setting of chronic Eliquis-6 g drop in hemoglobin over 2 months  Hemoccult negative on admission CT abdomen and pelvis negative for any evidence of intra-abdominal/retroperitoneal bleed  Eliquis on hold since admission Patient had dialysis today  Labs today hemoglobin 8/hematocrit 23.9-stable post transfusions on admission  No complaints other than being hungry-taking clear liquids and says that is the first meal he has had in a couple of days.  He was on a regular diet yesterday but says he did not eat it.    Objective   Vital signs in last 24 hours: Temp:  [98.3 F (36.8 C)-98.8 F (37.1 C)] 98.8 F (37.1 C) (01/02 1120) Pulse Rate:  [76-94] 94 (01/02 1120) Resp:  [8-18] 18 (01/02 1120) BP: (93-159)/(53-75) 144/71 (01/02 1120) SpO2:  [98 %-100 %] 98 % (01/02 1120) Weight:  [52.1 kg-52.8 kg] 52.1 kg (01/02 1012) Last BM Date: 10/12/21 General:    [name] in NAD Heart:  Regular rate and rhythm; no murmurs Lungs: Respirations even and unlabored, lungs CTA bilaterally Abdomen:  Soft, nontender and nondistended. Normal bowel sounds. Extremities:  Without edema. Neurologic:  Alert and oriented,  grossly normal neurologically. Psych:  Cooperative. Normal mood and affect.  Intake/Output from previous day: 01/01 0701 - 01/02 0700 In: 1873 [P.O.:1873] Out: -  Intake/Output this shift: Total I/O In: -  Out: 747 [Other:747]  Lab Results: Recent Labs    10/14/21 0328 10/15/21 0407 10/16/21 0801  WBC 7.5 8.6 9.0  HGB 7.8* 7.5* 8.0*  HCT 22.7* 22.9* 23.9*  PLT 219 218 219   BMET Recent Labs    10/14/21 0328  NA 137  K 3.9  CL 98  CO2 29  GLUCOSE 111*  BUN 17  CREATININE 4.71*  CALCIUM 8.9    PT/INR Recent Labs    10/13/21 1300  LABPROT 14.5  INR 1.1     Assessment / Plan:    #34   71 year old African-American male with ESRD on dialysis with new profound normocytic anemia, heme-negative on admission but with 6 g drop in hemoglobin over 2 months in setting of new initiation of Eliquis  CT abdomen and pelvis on admission negative for any evidence of retroperitoneal or intraperitoneal blood loss  No prior endoscopic evaluation  #2  Hypertension #3  Adult onset diabetes mellitus #4  History of hypercoagulable state, recent DVT-repeat lower extremity ultrasound pending today #5 status post left BKA and probable right foot chronic ischemia work-up in progress #6 cystitis on CT  Plan:  Clear liquids today, n.p.o. after midnight Continue to hold Eliquis-will need to be resumed postprocedure Bowel prep to start later today Patient is scheduled for EGD and colonoscopy with Dr. Henrene Pastor tomorrow.  Procedures have been discussed with the patient in detail including indications risk and benefits and he is agreeable to proceed.   LOS: 3 days   Amy Esterwood PA-C 10/16/2021, 12:04 PM    Attending Physician Note   I have taken an interval history, reviewed the chart and examined the patient. I personally saw the patient and performed more than 50% of this encounter in conjunction with the APP. I agree with the APP's note, impression and recommendations.    Lucio Edward, MD [facility] See AMION, [facility], for our on call provider

## 2021-10-16 NOTE — Progress Notes (Addendum)
PROGRESS NOTE    Roger Vazquez  KGU:[id number] DOB: [birth year] DOA: 10/12/2021 PCP: [facility]   Brief Narrative: 71 year old with past medical history significant for ESRD on hemodialysis MWF, PAD status post left BKA, diabetes, hypertension presented with a hemoglobin of 6.4, lab work performed at skilled nursing facility.  Reported feeling weak and tired.  He does not know if he has had bloody stool.  Patient admitted with anemia, hemoglobin a month ago was 12. He was admitted for further evaluation of  anemia.  GI has been consulted and plan is for endoscopy colonoscopy.   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  GI consulted, For endoscopy /colonoscopy tomorrow.  Received one unit PRBC>  Hb 6.6---7.4--7.8--7.5--8.0 IV Protonix.   DVT:  Patient with a prior history of DVT  diagnosed 08/13/2021. He has been on Eliquis, continue to hold for now. Plan to check doppler if still positive for DVT, will start Heparin Gtt. Awaiting doppler.  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 Check hb tonight.   Heel ulcer, right with unspecified severity He has been followed by vascular.  He had failed ABIs recently with inadequate perfusion to promote wound healing. MRI: pending.  He follows with Dr Stanford Breed, he is suppose to get arteriogram and possible intervention in 1-2 weeks. Will send message to Dr Stanford Breed tomorrow or Tuesday after holidays. Marland Kitchen   ESRD  on hemodialysis: Nephrology Consulted and following He had HD 1/02  Hypertension: Continue with  Norvasc  Type 2 diabetes with chronic kidney disease on chronic dialysis: Prior A1c 7.9 Continue With SSI   Markedly distended and enlarged bladder with thickened bladder wall and inflammation surrounding bladder. Findings are consistent with cystitis. In and out cath perform yield 500 cc urine.  UA with large leukocyte.  Received one dose of ceftriaxone. Urine culture no growth.   See wound care documentation below.   Pressure Injury 10/13/21 Right Unstageable - Full thickness tissue loss in which the base of the injury is covered by slough (yellow, tan, gray, green or brown) and/or eschar (tan, brown or black) in the wound bed. (Active)  10/13/21   Location:   Location Orientation: Right  Staging: Unstageable - Full thickness tissue loss in which the base of the injury is covered by slough (yellow, tan, gray, green or brown) and/or eschar (tan, brown or black) in the wound bed.  Wound Description (Comments):   Present on Admission: Yes      Estimated body mass index is 18.54 kg/m as calculated from the following:   Height as of this encounter: 5' 6"  (1.676 m).   Weight as of this encounter: 52.1 kg.   DVT prophylaxis: SCD Code Status: Full code Family Communication: sister  12/31 Disposition Plan:  Status is: Inpatient  Remains inpatient appropriate because: evaluation for anemia         Consultants:  GI Nephrology   Procedures:    Antimicrobials:  Ceftriaxone   Subjective: He is complaining of pain leg  Objective: Vitals:   10/16/21 0930 10/16/21 1000 10/16/21 1012 10/16/21 1120  BP: 114/65 (!) 109/53  129/67 (!) 144/71  Pulse: 85 76 86 94  Resp: 12  18 18   Temp:   98.4 F (36.9 C) 98.8 F (37.1 C)  TempSrc:   Oral Oral  SpO2:   99% 98%  Weight:   52.1 kg   Height:        Intake/Output Summary (Last 24 hours) at 10/16/2021 1241 Last data filed at 10/16/2021 1005 Gross per 24 hour  Intake 940 ml  Output 747 ml  Net 193 ml    Filed  Weights   10/14/21 0451 10/16/21 0653 10/16/21 1012  Weight: 51.5 kg 52.8 kg 52.1 kg    Examination:  General exam: NAD Respiratory system: CTA Cardiovascular system: S 1, S 2 RRR Gastrointestinal system: Bs present, soft, nt Central nervous system: Alert Extremities: Right heel with dressing  Data Reviewed: I have personally reviewed following labs and imaging studies  CBC: Recent Labs  Lab 10/12/21 2231 10/13/21 1606 10/14/21 0328 10/15/21 0407 10/16/21 0801  WBC 9.3 9.5 7.5 8.6 9.0  NEUTROABS 6.3  --   --   --   --   HGB 6.6* 7.4* 7.8* 7.5* 8.0*  HCT 20.6* 22.5* 22.7* 22.9* 23.9*  MCV 95.8 93.0 93.4 94.2 95.2  PLT 263 234 219 218 283    Basic Metabolic Panel: Recent Labs  Lab 10/12/21 2231 10/14/21 0328  NA 138 137  K 4.3 3.9  CL 95* 98  CO2 32 29  GLUCOSE 161* 111*  BUN 21 17  CREATININE 5.62* 4.71*  CALCIUM 9.1 8.9    GFR: Estimated Creatinine Clearance: 10.6 mL/min (A) (by C-G formula based on SCr of 4.71 mg/dL (H)). Liver Function Tests: Recent Labs  Lab 10/12/21 2231  AST 24  ALT 15  ALKPHOS 79  BILITOT 0.5  PROT 8.6*  ALBUMIN 3.3*    No results for input(s): LIPASE, AMYLASE in the last 168 hours. No results for input(s): AMMONIA in the last 168 hours. Coagulation Profile: Recent Labs  Lab 10/13/21 1300  INR 1.1    Cardiac Enzymes: No results for input(s): CKTOTAL, CKMB, CKMBINDEX, TROPONINI in the last 168 hours. BNP (last 3 results) No results for input(s): PROBNP in the last 8760 hours. HbA1C: No results for input(s): HGBA1C in the last 72 hours. CBG: Recent Labs  Lab 10/15/21 0657 10/15/21 1123 10/15/21 1633 10/16/21 0635 10/16/21 1126  GLUCAP 147* 210* 139* 153* 158*    Lipid Profile: No results for input(s): CHOL, HDL, LDLCALC, TRIG, CHOLHDL, LDLDIRECT in the last 72 hours. Thyroid Function Tests: No results for input(s): TSH, T4TOTAL, FREET4, T3FREE, THYROIDAB in the last 72 hours. Anemia Panel: No results for  input(s): VITAMINB12, FOLATE, FERRITIN, TIBC, IRON, RETICCTPCT in the last 72 hours.  Sepsis Labs: No results for input(s): PROCALCITON, LATICACIDVEN in the last 168 hours.  Recent Results (from the past 240 hour(s))  Resp Panel by RT-PCR (Flu A&B, Covid) Nasopharyngeal Swab     Status: None   Collection Time: 10/13/21  5:00 AM   Specimen: Nasopharyngeal Swab; Nasopharyngeal(NP) swabs in vial transport medium  Result Value Ref Range Status   SARS Coronavirus 2 by RT PCR NEGATIVE NEGATIVE Final    Comment: (NOTE) SARS-CoV-2 target nucleic acids are NOT DETECTED.  The SARS-CoV-2 RNA is generally detectable in upper respiratory specimens during the acute phase of infection. The lowest concentration of SARS-CoV-2 viral copies this assay can detect is 138 copies/mL. A negative result does not preclude SARS-Cov-2 infection and should not be used as the sole basis  for treatment or other patient management decisions. A negative result may occur with  improper specimen collection/handling, submission of specimen other than nasopharyngeal swab, presence of viral mutation(s) within the areas targeted by this assay, and inadequate number of viral copies(<138 copies/mL). A negative result must be combined with clinical observations, patient history, and epidemiological information. The expected result is Negative.  Fact Sheet for Patients:  EntrepreneurPulse.com.au  Fact Sheet for Healthcare Providers:  IncredibleEmployment.be  This test is no t yet approved or cleared by the [place] FDA and  has been authorized for detection and/or diagnosis of SARS-CoV-2 by FDA under an Emergency Use Authorization (EUA). This EUA will remain  in effect (meaning this test can be used) for the duration of the COVID-19 declaration under Section 564(b)(1) of the Act, 21 U.S.C.section 360bbb-3(b)(1), unless the authorization is terminated  or revoked sooner.        Influenza A by PCR NEGATIVE NEGATIVE Final   Influenza B by PCR NEGATIVE NEGATIVE Final    Comment: (NOTE) The Xpert Xpress SARS-CoV-2/FLU/RSV plus assay is intended as an aid in the diagnosis of influenza from Nasopharyngeal swab specimens and should not be used as a sole basis for treatment. Nasal washings and aspirates are unacceptable for Xpert Xpress SARS-CoV-2/FLU/RSV testing.  Fact Sheet for Patients: EntrepreneurPulse.com.au  Fact Sheet for Healthcare Providers: IncredibleEmployment.be  This test is not yet approved or cleared by the [place] FDA and has been authorized for detection and/or diagnosis of SARS-CoV-2 by FDA under an Emergency Use Authorization (EUA). This EUA will remain in effect (meaning this test can be used) for the duration of the COVID-19 declaration under Section 564(b)(1) of the Act, 21 U.S.C. section 360bbb-3(b)(1), unless the authorization is terminated or revoked.  Performed at [facility], [place] [address], [place], [place] [address]   Urine Culture     Status: None   Collection Time: 10/14/21 10:40 AM   Specimen: Urine, Clean Catch  Result Value Ref Range Status   Specimen Description URINE, CLEAN CATCH  Final   Special Requests NONE  Final   Culture   Final    NO GROWTH Performed at [facility], [place] [address] [place], [place] [address]    Report Status 10/15/2021 FINAL  Final          Radiology Studies: No results found.      Scheduled Meds:  amLODipine  10 mg Oral Daily   calcium acetate  667 mg Oral TID WC   Chlorhexidine Gluconate Cloth  6 each Topical Q0600   [START ON 10/20/2021] darbepoetin (ARANESP) injection - DIALYSIS  100 mcg Intravenous Q Fri-HD   feeding supplement (NEPRO CARB STEADY)  237 mL Oral TID BM   ferrous sulfate  325 mg Oral Q breakfast   insulin aspart  0-6 Units Subcutaneous TID WC   multivitamin  1 tablet Oral QHS   pantoprazole (PROTONIX) IV  40  mg Intravenous Q12H   peg 3350 powder  0.5 kit Oral Once   And   peg 3350 powder  0.5 kit Oral Once   polyethylene glycol  17 g Oral BID   senna  1 tablet Oral Daily   sodium chloride flush  3 mL Intravenous Q12H   Continuous Infusions:  sodium chloride       LOS: 3 days    Time spent: 35 minutes.     Elmarie Shiley, MD [facility]   If 7PM-7AM, please contact night-coverage [email or web address]  10/16/2021,  12:41 PM

## 2021-10-16 NOTE — Progress Notes (Signed)
LLE venous duplex has been completed.  Results can be found under chart review under CV PROC. 10/16/2021 2:47 PM Zuhair Lariccia RVT, RDMS

## 2021-10-16 NOTE — Progress Notes (Signed)
ANTICOAGULATION CONSULT NOTE - Initial Consult  Pharmacy Consult for heparin  Indication: DVT  Allergies  Allergen Reactions   Sildenafil Other (See Comments)    Pt stated, "Makes my Blood Pressure drop"    Patient Measurements: Height: _0  (167.6 cm) Weight: 52.1 kg (114 lb 13.8 oz) IBW/kg (Calculated) : 63.8   Vital Signs: Temp: 98.8 F (37.1 C) (01/02 1120) Temp Source: Oral (01/02 1120) BP: 144/71 (01/02 1120) Pulse Rate: 94 (01/02 1120)  Labs: Recent Labs    10/14/21 0328 10/15/21 0407 10/16/21 0801  HGB 7.8* 7.5* 8.0*  HCT 22.7* 22.9* 23.9*  PLT 219 218 219  CREATININE 4.71*  --   --     Estimated Creatinine Clearance: 10.6 mL/min (A) (by C-G formula based on SCr of 4.71 mg/dL (H)).   Medical History: Past Medical History:  Diagnosis Date   Anemia    Blood dyscrasia    history of coagulation deficit per [facility] nursing home   Cancer [facility])    Lung   Diabetes mellitus    Hepatitis    Hypertension    Renal disorder    MWF- [facility]    Medications:  Medications Prior to Admission  Medication Sig Dispense Refill Last Dose   acetaminophen (TYLENOL) 325 MG tablet Take 650 mg by mouth every 6 (six) hours.   Past Week   amLODipine (NORVASC) 10 MG tablet Take 1 tablet (10 mg total) by mouth daily.   10/12/2021   apixaban (ELIQUIS) 5 MG TABS tablet Take 1 tablet (5 mg total) by mouth 2 (two) times daily. 60 tablet  10/12/2021 at 9am   aspirin 81 MG chewable tablet Chew 81 mg by mouth daily. (0800)   10/12/2021   calcium acetate (PHOSLO) 667 MG capsule Take 667 mg by mouth 3 (three) times daily with meals. (0800, 1300 & 1700)   10/12/2021   ferrous sulfate 325 (65 FE) MG tablet Take 325 mg by mouth daily with breakfast.   10/13/2021   hydrocortisone (ANUSOL-HC) 25 MG suppository Place 25 mg rectally 2 (two) times daily as needed for hemorrhoids or anal itching.   unk   lidocaine-prilocaine (EMLA) cream Apply 1 application topically See  admin instructions. As needed on dialysis days.Laurine Blazer and Friday)   Past Week   Multiple Vitamin (MULITIVITAMIN WITH MINERALS) TABS Take 1 tablet by mouth daily. 30 tablet 0 10/12/2021   Nutritional Supplements (NOVASOURCE RENAL PO) Take 237 mLs by mouth daily.   Past Week   oxyCODONE (OXY IR/ROXICODONE) 5 MG immediate release tablet Take 5 mg by mouth 2 (two) times daily as needed for severe pain.   10/12/2021   polyethylene glycol (MIRALAX / GLYCOLAX) 17 g packet Take 17 g by mouth 2 (two) times daily. 14 each 0 Past Week   senna (SENOKOT) 8.6 MG TABS tablet Take 1 tablet (8.6 mg total) by mouth daily. 7 tablet 0 10/12/2021   apixaban (ELIQUIS) 5 MG TABS tablet Take 2 tablets (10 mg total) by mouth 2 (two) times daily for 7 doses. (Patient not taking: Reported on 10/13/2021) 60 tablet  Not Taking   Scheduled:   amLODipine  10 mg Oral Daily   calcium acetate  667 mg Oral TID WC   Chlorhexidine Gluconate Cloth  6 each Topical Q0600   [START ON 10/20/2021] darbepoetin (ARANESP) injection - DIALYSIS  100 mcg Intravenous Q Fri-HD   feeding supplement (NEPRO CARB STEADY)  237 mL Oral TID BM   ferrous sulfate  325 mg  Oral Q breakfast   insulin aspart  0-6 Units Subcutaneous TID WC   multivitamin  1 tablet Oral QHS   pantoprazole (PROTONIX) IV  40 mg Intravenous Q12H   peg 3350 powder  0.5 kit Oral Once   polyethylene glycol  17 g Oral BID   senna  1 tablet Oral Daily   sodium chloride flush  3 mL Intravenous Q12H    Assessment: 71 yo male with recent DVT (07/2021) on apixaban PTA (last dose taken is unclear). He is noted with symptomatic anemia with plans for endoscopy/colonoscopy on 1/3. Pharmacy consulted to dose heparin. He is noted with ESRD -Hg= 8.0 (6.6 on 12/29)  Goal of Therapy:  Heparin level= 0.3-0.5 Monitor platelets by anticoagulation protocol: Yes   Plan:  -Check heparin level -Start heparin at 600 units/hr -Stop heparin at 4am on 1/3 for endoscopy/colonoscopy at  10am -No heparin levels due to plans for procedure  Hildred Laser, PharmD Clinical Pharmacist **Pharmacist phone directory can now be found on [email or web address] ([email or web address]).  Listed under [place].

## 2021-10-16 NOTE — Progress Notes (Addendum)
[facility] Progress Note   Subjective: Seen on HD via TDC. No C/Os.   Objective Vitals:   10/16/21 0704 10/16/21 0730 10/16/21 0800 10/16/21 0830  BP: 138/69 125/65 126/63 127/66  Pulse: 85 84 86 88  Resp:  _0 Temp:      TempSrc:      SpO2:      Weight:      Height:       Physical Exam General: Chronically ill appearing male in NAD Heart: S1,S2 RRR no M/R. SR 1st degree AVB on monitor  Lungs: CTAB. No WOB Abdomen:NABS, NT Extremities: L BKA no stump edema RLE no edema, heel protector Dialysis Access: LIJ TDC drsg CDI. Has R AVG + T/B with suture intact    Additional Objective Labs: Basic Metabolic Panel: Recent Labs  Lab 10/12/21 2231 10/14/21 0328  NA 138 137  K 4.3 3.9  CL 95* 98  CO2 32 29  GLUCOSE 161* 111*  BUN 21 17  CREATININE 5.62* 4.71*  CALCIUM 9.1 8.9   Liver Function Tests: Recent Labs  Lab 10/12/21 2231  AST 24  ALT 15  ALKPHOS 79  BILITOT 0.5  PROT 8.6*  ALBUMIN 3.3*   No results for input(s): LIPASE, AMYLASE in the last 168 hours. CBC: Recent Labs  Lab 10/12/21 2231 10/13/21 1606 10/14/21 0328 10/15/21 0407 10/16/21 0801  WBC 9.3 9.5 7.5 8.6 9.0  NEUTROABS 6.3  --   --   --   --   HGB 6.6* 7.4* 7.8* 7.5* 8.0*  HCT 20.6* 22.5* 22.7* 22.9* 23.9*  MCV 95.8 93.0 93.4 94.2 95.2  PLT 263 234 219 218 219   Blood Culture    Component Value Date/Time   SDES URINE, CLEAN CATCH 10/14/2021 1040   SPECREQUEST NONE 10/14/2021 1040   CULT  10/14/2021 1040    NO GROWTH Performed at [facility], [place] [address], [place], [place] [address]    REPTSTATUS 10/15/2021 FINAL 10/14/2021 1040    Cardiac Enzymes: No results for input(s): CKTOTAL, CKMB, CKMBINDEX, TROPONINI in the last 168 hours. CBG: Recent Labs  Lab 10/14/21 2151 10/15/21 0657 10/15/21 1123 10/15/21 1633 10/16/21 0635  GLUCAP 96 147* 210* 139* 153*   Iron Studies: No results for input(s): IRON, TIBC, TRANSFERRIN, FERRITIN in the last 72  hours. _1 @ Studies/Results: CT ABDOMEN PELVIS WO CONTRAST  Result Date: 10/14/2021 CLINICAL DATA:  6 gram drop in hemoglobin. Assess for intra-abdominal or retroperitoneal bleed. EXAM: CT ABDOMEN AND PELVIS WITHOUT CONTRAST TECHNIQUE: Multidetector CT imaging of the abdomen and pelvis was performed following the standard protocol without IV contrast. COMPARISON:  October 07, 2011 FINDINGS: Lower chest: Mild atelectasis of posterior lung bases are noted. The heart size is mildly enlarged. Hepatobiliary: No focal liver abnormality is seen. No gallstones, gallbladder wall thickening, or biliary dilatation. Pancreas: Unremarkable. No pancreatic ductal dilatation or surrounding inflammatory changes. Spleen: Normal in size without focal abnormality. Adrenals/Urinary Tract: Left parapelvic renal cysts is identified. Nonobstructing stone is identified in the left kidney. There is no focal obstructing stone or dilatation of left ureter. Small stone is identified in the right kidney. There is no right hydronephrosis. The bladder is markedly distended and enlarged with thickened bladder wall. There is inflammation surrounding bladder. Stomach/Bowel: Stomach is within normal limits. Appendix is not seen but no inflammation is noted around cecum. No evidence of bowel wall thickening, distention, or inflammatory changes. There is diverticulosis of colon without diverticulitis. Vascular/Lymphatic: Aortic atherosclerosis. No enlarged  abdominal or pelvic lymph nodes. Reproductive: Prostate is unremarkable. Other: No evidence of retroperitoneal or intra-abdominal hemorrhage. Musculoskeletal: Minimal degenerative joint changes of the spine are noted. IMPRESSION: 1. Markedly distended and enlarged bladder with thickened bladder wall and inflammation surrounding bladder. Findings are consistent with cystitis. 2. No evidence of retroperitoneal hemorrhage. Aortic Atherosclerosis (ICD10-I70.0). Electronically Signed   By:  Abelardo Diesel M.D.   On: 10/14/2021 10:00   Medications:  sodium chloride      amLODipine  10 mg Oral Daily   calcium acetate  667 mg Oral TID WC   Chlorhexidine Gluconate Cloth  6 each Topical Q0600   [START ON 10/20/2021] darbepoetin (ARANESP) injection - DIALYSIS  100 mcg Intravenous Q Fri-HD   feeding supplement (NEPRO CARB STEADY)  237 mL Oral TID BM   ferrous sulfate  325 mg Oral Q breakfast   insulin aspart  0-6 Units Subcutaneous TID WC   multivitamin  1 tablet Oral QHS   pantoprazole (PROTONIX) IV  40 mg Intravenous Q12H   peg 3350 powder  0.5 kit Oral Once   And   peg 3350 powder  0.5 kit Oral Once   polyethylene glycol  17 g Oral BID   senna  1 tablet Oral Daily   sodium chloride flush  3 mL Intravenous Q12H     Dialysis Orders: Center: [facility]  on MWF . 180NRe, 3 hr 15 min, BFR 400, DFR A1.5, EDW 54kg, 2K 2Ca, TDC AVG with suture intact. no heparin Mircera 284mg IV q HD- last dose was 156m on 10/19 Calcium acetate 66738m tab TID with meals Tums 200m57m tabs PO BID   Assessment/Plan:  Symptomatic anemia: GI bleed suspected, GI is on board. Pt currently on PPI, Eliquis on hold, plan is for EGD and colonoscopy. S/P 1 unit PRBCs 10/13/2021. HGB 8.0 today.   ESRD:  Dialysis on MWF schedule, next HD 09/17/2022.   Hypertension/volume: BP moderately elevated. Euvolemic on exam. UF as tolerated.   Anemia: Due to #1 above. MircRecardo Evangelist been ordered outpatient but not started yet, given aranesp with HD Friday  Metabolic bone disease: Calcium on the high side, noted he takes tums and calcium acetate outpatient. Currently NPO but will resume calcium acetate once he is eating, hold tums for now.   Nutrition:  Currently on clear liquids.  Malfunctioning AVG: Went to [facility] and shuntogram earlier in December 2022. Still has suture in AVG. Will remove suture. Getting records-nothing in [place].    Rita H. Brown NP-C 10/16/2021, 8:34 AM  [employer]-[phone] and examined independently.  Agree with note and exam as documented above by physician extender and as noted here.  See also my procedure note from today.   LoriClaudia Desanctis 10/16/2021  11:57 AM

## 2021-10-16 NOTE — Procedures (Signed)
Seen and examined on dialysis.  Procedure supervised.  Blood pressure 128/62 and HR 84.  Left IJ tunn catheter in use.  Tolerating goal.    Per GI charting is to have EGD and colon on 1/3 due to anesthesia limitations.  Procedure pushed back from 1/2.  Denies dizziness or cramping and states HD going well.  Labs weren't sent yet today.  Now on HD.  Obtain CBC now and renal panel in AM  Claudia Desanctis, MD 10/16/2021  7:55 AM

## 2021-10-16 NOTE — H&P (View-Only) (Signed)
Patient ID: Roger Vazquez, male   DOB: [birth year], 71 y.o.   MRN: [id number]    Progress Note   Subjective  Day # 4 CC: normocytic anemia in setting of chronic Eliquis-6 g drop in hemoglobin over 2 months  Hemoccult negative on admission CT abdomen and pelvis negative for any evidence of intra-abdominal/retroperitoneal bleed  Eliquis on hold since admission Patient had dialysis today  Labs today hemoglobin 8/hematocrit 23.9-stable post transfusions on admission  No complaints other than being hungry-taking clear liquids and says that is the first meal he has had in a couple of days.  He was on a regular diet yesterday but says he did not eat it.    Objective   Vital signs in last 24 hours: Temp:  [98.3 F (36.8 C)-98.8 F (37.1 C)] 98.8 F (37.1 C) (01/02 1120) Pulse Rate:  [76-94] 94 (01/02 1120) Resp:  [8-18] 18 (01/02 1120) BP: (93-159)/(53-75) 144/71 (01/02 1120) SpO2:  [98 %-100 %] 98 % (01/02 1120) Weight:  [52.1 kg-52.8 kg] 52.1 kg (01/02 1012) Last BM Date: 10/12/21 General:    [name] in NAD Heart:  Regular rate and rhythm; no murmurs Lungs: Respirations even and unlabored, lungs CTA bilaterally Abdomen:  Soft, nontender and nondistended. Normal bowel sounds. Extremities:  Without edema. Neurologic:  Alert and oriented,  grossly normal neurologically. Psych:  Cooperative. Normal mood and affect.  Intake/Output from previous day: 01/01 0701 - 01/02 0700 In: 1873 [P.O.:1873] Out: -  Intake/Output this shift: Total I/O In: -  Out: 747 [Other:747]  Lab Results: Recent Labs    10/14/21 0328 10/15/21 0407 10/16/21 0801  WBC 7.5 8.6 9.0  HGB 7.8* 7.5* 8.0*  HCT 22.7* 22.9* 23.9*  PLT 219 218 219   BMET Recent Labs    10/14/21 0328  NA 137  K 3.9  CL 98  CO2 29  GLUCOSE 111*  BUN 17  CREATININE 4.71*  CALCIUM 8.9    PT/INR Recent Labs    10/13/21 1300  LABPROT 14.5  INR 1.1     Assessment / Plan:    #6   71 year old African-American male with ESRD on dialysis with new profound normocytic anemia, heme-negative on admission but with 6 g drop in hemoglobin over 2 months in setting of new initiation of Eliquis  CT abdomen and pelvis on admission negative for any evidence of retroperitoneal or intraperitoneal blood loss  No prior endoscopic evaluation  #2  Hypertension #3  Adult onset diabetes mellitus #4  History of hypercoagulable state, recent DVT-repeat lower extremity ultrasound pending today #5 status post left BKA and probable right foot chronic ischemia work-up in progress #6 cystitis on CT  Plan:  Clear liquids today, n.p.o. after midnight Continue to hold Eliquis-will need to be resumed postprocedure Bowel prep to start later today Patient is scheduled for EGD and colonoscopy with Dr. Henrene Pastor tomorrow.  Procedures have been discussed with the patient in detail including indications risk and benefits and he is agreeable to proceed.   LOS: 3 days   Amy Esterwood PA-C 10/16/2021, 12:04 PM    Attending Physician Note   I have taken an interval history, reviewed the chart and examined the patient. I personally saw the patient and performed more than 50% of this encounter in conjunction with the APP. I agree with the APP's note, impression and recommendations.    Lucio Edward, MD [facility] See AMION, [facility], for our on call provider

## 2021-10-17 ENCOUNTER — Encounter (HOSPITAL_COMMUNITY)
Admission: EM | Disposition: A | Discharge status: Skilled Nursing Facility | Payer: Self-pay | Source: Skilled Nursing Facility | Attending: Internal Medicine

## 2021-10-17 ENCOUNTER — Encounter (HOSPITAL_COMMUNITY): Payer: Self-pay | Admitting: Internal Medicine

## 2021-10-17 ENCOUNTER — Encounter (HOSPITAL_COMMUNITY): Payer: No Typology Code available for payment source

## 2021-10-17 ENCOUNTER — Inpatient Hospital Stay (HOSPITAL_COMMUNITY): Payer: No Typology Code available for payment source | Admitting: Certified Registered"

## 2021-10-17 ENCOUNTER — Other Ambulatory Visit: Payer: Self-pay

## 2021-10-17 DIAGNOSIS — K259 Gastric ulcer, unspecified as acute or chronic, without hemorrhage or perforation: Secondary | ICD-10-CM | POA: Diagnosis not present

## 2021-10-17 DIAGNOSIS — N186 End stage renal disease: Secondary | ICD-10-CM

## 2021-10-17 DIAGNOSIS — Z794 Long term (current) use of insulin: Secondary | ICD-10-CM

## 2021-10-17 DIAGNOSIS — Z79899 Other long term (current) drug therapy: Secondary | ICD-10-CM

## 2021-10-17 DIAGNOSIS — D509 Iron deficiency anemia, unspecified: Secondary | ICD-10-CM

## 2021-10-17 DIAGNOSIS — E1151 Type 2 diabetes mellitus with diabetic peripheral angiopathy without gangrene: Secondary | ICD-10-CM

## 2021-10-17 DIAGNOSIS — Z87891 Personal history of nicotine dependence: Secondary | ICD-10-CM

## 2021-10-17 DIAGNOSIS — I70235 Atherosclerosis of native arteries of right leg with ulceration of other part of foot: Secondary | ICD-10-CM

## 2021-10-17 DIAGNOSIS — E7800 Pure hypercholesterolemia, unspecified: Secondary | ICD-10-CM

## 2021-10-17 DIAGNOSIS — D649 Anemia, unspecified: Secondary | ICD-10-CM

## 2021-10-17 DIAGNOSIS — L97519 Non-pressure chronic ulcer of other part of right foot with unspecified severity: Secondary | ICD-10-CM

## 2021-10-17 DIAGNOSIS — M86171 Other acute osteomyelitis, right ankle and foot: Secondary | ICD-10-CM

## 2021-10-17 DIAGNOSIS — K253 Acute gastric ulcer without hemorrhage or perforation: Secondary | ICD-10-CM

## 2021-10-17 DIAGNOSIS — Z992 Dependence on renal dialysis: Secondary | ICD-10-CM

## 2021-10-17 DIAGNOSIS — L97419 Non-pressure chronic ulcer of right heel and midfoot with unspecified severity: Secondary | ICD-10-CM

## 2021-10-17 DIAGNOSIS — E1122 Type 2 diabetes mellitus with diabetic chronic kidney disease: Secondary | ICD-10-CM

## 2021-10-17 DIAGNOSIS — K5730 Diverticulosis of large intestine without perforation or abscess without bleeding: Secondary | ICD-10-CM | POA: Diagnosis not present

## 2021-10-17 DIAGNOSIS — Z89512 Acquired absence of left leg below knee: Secondary | ICD-10-CM

## 2021-10-17 DIAGNOSIS — Z7952 Long term (current) use of systemic steroids: Secondary | ICD-10-CM

## 2021-10-17 DIAGNOSIS — E1169 Type 2 diabetes mellitus with other specified complication: Secondary | ICD-10-CM

## 2021-10-17 DIAGNOSIS — I10 Essential (primary) hypertension: Secondary | ICD-10-CM

## 2021-10-17 HISTORY — PX: ESOPHAGOGASTRODUODENOSCOPY (EGD) WITH PROPOFOL: SHX5813

## 2021-10-17 HISTORY — PX: COLONOSCOPY WITH PROPOFOL: SHX5780

## 2021-10-17 HISTORY — PX: BIOPSY: SHX5522

## 2021-10-17 LAB — CBC
HCT: 25.1 % — ABNORMAL LOW (ref 39.0–52.0)
Hemoglobin: 8.4 g/dL — ABNORMAL LOW (ref 13.0–17.0)
MCH: 31.6 pg (ref 26.0–34.0)
MCHC: 33.5 g/dL (ref 30.0–36.0)
MCV: 94.4 fL (ref 80.0–100.0)
Platelets: 212 10*3/uL (ref 150–400)
RBC: 2.66 MIL/uL — ABNORMAL LOW (ref 4.22–5.81)
RDW: 18.9 % — ABNORMAL HIGH (ref 11.5–15.5)
WBC: 6.7 10*3/uL (ref 4.0–10.5)
nRBC: 0 % (ref 0.0–0.2)

## 2021-10-17 LAB — RENAL FUNCTION PANEL
Albumin: 3 g/dL — ABNORMAL LOW (ref 3.5–5.0)
Anion gap: 13 (ref 5–15)
BUN: 22 mg/dL (ref 8–23)
CO2: 22 mmol/L (ref 22–32)
Calcium: 8.9 mg/dL (ref 8.9–10.3)
Chloride: 100 mmol/L (ref 98–111)
Creatinine, Ser: 4.71 mg/dL — ABNORMAL HIGH (ref 0.61–1.24)
GFR, Estimated: 13 mL/min — ABNORMAL LOW (ref >60)
Glucose, Bld: 154 mg/dL — ABNORMAL HIGH (ref 70–99)
Phosphorus: 1.6 mg/dL — ABNORMAL LOW (ref 2.5–4.6)
Potassium: 4.2 mmol/L (ref 3.5–5.1)
Sodium: 135 mmol/L (ref 135–145)

## 2021-10-17 LAB — GLUCOSE, CAPILLARY
Glucose-Capillary: 141 mg/dL — ABNORMAL HIGH (ref 70–99)
Glucose-Capillary: 135 mg/dL — ABNORMAL HIGH (ref 70–99)
Glucose-Capillary: 186 mg/dL — ABNORMAL HIGH (ref 70–99)
Glucose-Capillary: 80 mg/dL (ref 70–99)

## 2021-10-17 LAB — APTT: aPTT: 48 seconds — ABNORMAL HIGH (ref 24–36)

## 2021-10-17 SURGERY — COLONOSCOPY WITH PROPOFOL
Anesthesia: Monitor Anesthesia Care

## 2021-10-17 MED ORDER — HEPARIN (PORCINE) 25000 UT/250ML-% IV SOLN
1200.0000 [IU]/h | INTRAVENOUS | Status: DC
  Administered 2021-10-17: 750 [IU]/h via INTRAVENOUS
  Administered 2021-10-18: 1000 [IU]/h via INTRAVENOUS
  Administered 2021-10-19: 1200 [IU]/h via INTRAVENOUS
  Filled 2021-10-17 (×3): qty 250

## 2021-10-17 MED ORDER — PROPOFOL 500 MG/50ML IV EMUL
INTRAVENOUS | Status: DC | PRN
  Administered 2021-10-17: 75 ug/kg/min via INTRAVENOUS

## 2021-10-17 SURGICAL SUPPLY — 25 items

## 2021-10-17 NOTE — Progress Notes (Signed)
ANTICOAGULATION CONSULT NOTE  Pharmacy Consult for heparin  Indication: DVT Brief A/P: aPTT subtherapeutic  Increase Heparin  rate  Allergies  Allergen Reactions   Sildenafil Other (See Comments)    Pt stated, "Makes my Blood Pressure drop"    Patient Measurements: Height: 5\' 6"  (167.6 cm) Weight: 52.1 kg (114 lb 13.8 oz) IBW/kg (Calculated) : 63.8   Vital Signs: Temp: 98 F (36.7 C) (01/03 2115) Temp Source: Oral (01/03 1636) BP: 136/65 (01/03 2115) Pulse Rate: 85 (01/03 2115)  Labs: Recent Labs    10/15/21 0407 10/16/21 0801 10/16/21 1738 10/16/21 1842 10/17/21 0221 10/17/21 2131  HGB 7.5* 8.0*  --  9.3* 8.4*  --   HCT 22.9* 23.9*  --  28.3* 25.1*  --   PLT 218 219  --   --  212  --   APTT  --   --   --   --   --  48*  HEPARINUNFRC  --   --  0.88*  --   --   --   CREATININE  --   --   --   --  4.71*  --      Estimated Creatinine Clearance: 10.6 mL/min (A) (by C-G formula based on SCr of 4.71 mg/dL (H)).  Assessment: 71 yo male with recent DVT, Eliquis on hold s/p endoscopy/colonoscopy 1/3, for heparin. aPTT subtherapeutic  Goal of Therapy:  Heparin level 0.3-0.7 units/mL aPTT 66-102 sec Monitor platelets by anticoagulation protocol: Yes   Plan:  Increase Heparin 900 units/hr Follow-up am labs.  Phillis Knack, PharmD, BCPS

## 2021-10-17 NOTE — TOC Progression Note (Signed)
Transition of Care [facility]) - Initial/Assessment Note    Patient Details  Name: Roger Vazquez MRN: [id number] Date of Birth: Sep 10, 1951  [facility] CM/SW Contact:    Milinda Antis, [place] Phone Number: 10/17/2021, 2:12 PM  Clinical Narrative:                 CSW spoke with [facility].  The patient is LTC but does not have a bed hold.  CSW informed the facility that the patient may be ready to d/c tomorrow and that CSW will keep the facility updated.  The bed will be held until tomorrow.          Patient Goals and CMS Choice        Expected Discharge Plan and Services                                                Prior Living Arrangements/Services                       Activities of Daily Living Home Assistive Devices/Equipment: Wheelchair ADL Screening (condition at time of admission) Patient's cognitive ability adequate to safely complete daily activities?: No Is the patient deaf or have difficulty hearing?: No Does the patient have difficulty seeing, even when wearing glasses/contacts?: No Does the patient have difficulty concentrating, remembering, or making decisions?: No Patient able to express need for assistance with ADLs?: Yes Does the patient have difficulty dressing or bathing?: Yes Independently performs ADLs?: No Does the patient have difficulty walking or climbing stairs?: Yes Weakness of Legs: Both Weakness of Arms/Hands: Both  Permission Sought/Granted                  Emotional Assessment              Admission diagnosis:  Symptomatic anemia [D64.9] Patient Active Problem List   Diagnosis Date Noted   Diverticulosis of colon without hemorrhage    Acute gastric ulcer without hemorrhage or perforation    Chronic anticoagulation    Heel ulcer, right, with unspecified severity ([place]) 10/13/2021   DVT (deep venous thrombosis) ([place]) 10/13/2021   Lymphadenopathy 10/13/2021   COVID-19 32/67/1245    Acute metabolic encephalopathy 80/99/8338   COVID-19 virus infection 08/12/2021   Peripheral vascular disease ([place]) 06/09/2021   S/P BKA (below knee amputation), left ([place]) 06/08/2021   Status post amputation of foot through metatarsal bone ([place])    Altered mental status 05/19/2021   Left foot infection    Alcohol abuse 05/16/2021   Erectile dysfunction 05/16/2021   Hydronephrosis 05/16/2021   Acute hepatitis C 05/16/2021   Diabetic retinopathy ([place]) 05/16/2021   History of hemodialysis 05/16/2021   Iron deficiency anemia 05/16/2021   Malignant neoplasm of upper lobe, left bronchus or lung ([place]) 05/16/2021   Noncompliance with medication regimen 05/16/2021   Non-small cell lung cancer ([place]) 05/16/2021   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   Vitreous hemorrhage, left eye ([place]) 05/16/2021   Cellulitis of left lower extremity    Gangrene ([place]) 04/27/2021   Diarrhea, unspecified 04/29/2019   Mild protein-calorie malnutrition ([place]) 08/20/2018   Fever    Infection of AV graft for dialysis ([place]) 08/05/2018   Symptomatic anemia 08/05/2018   Hypokalemia 08/05/2018   GERD (gastroesophageal reflux disease) 08/05/2018   Sepsis due to unspecified Staphylococcus (  [place]) 08/02/2018   ESRD on hemodialysis ([place])    Bacteremia due to methicillin susceptible Staphylococcus aureus (MSSA) 07/28/2018   Sepsis ([place]) 07/27/2018   Headache 01/14/2017   Fluid overload, unspecified 12/13/2015   Anemia in chronic kidney disease 12/09/2015   Chronic kidney disease, stage 5 ([place]) 12/09/2015   Coagulation defect, unspecified ([place]) 12/09/2015   Encounter for adjustment and management of vascular access device 12/09/2015   Other secondary pulmonary hypertension ([place]) 12/09/2015   Pruritus, unspecified 12/09/2015   Secondary hyperparathyroidism of renal origin ([place]) 12/09/2015   Shortness of breath 12/09/2015   Abdominal pain, other specified site 10/07/2011   Nausea and vomiting 10/07/2011    VITAMIN D DEFICIENCY 03/19/2008   FRACTURE, TIBIA 02/09/2008   HTN (hypertension) 11/25/2007   HEPATITIS C 05/27/2007   Type 2 diabetes mellitus with chronic kidney disease on chronic dialysis ([place]) 05/27/2007   ERECTILE DYSFUNCTION 05/27/2007   ABUSE, ALCOHOL, IN REMISSION 05/27/2007   ABUSE, OPIOID, IN REMISSION 05/27/2007   ABUSE, COCAINE, IN REMISSION 05/27/2007   PCP:  Clinic, [place]:   [facility] [place] [place] [address] [place] [address] Phone: [phone] Fax: [phone]     Social Determinants of Health (SDOH) Interventions    Readmission Risk Interventions Readmission Risk Prevention Plan 05/30/2021  Transportation Screening Complete  SW Recovery Care/Counseling Consult Complete  Skilled Nursing Facility Complete  Some recent data might be hidden

## 2021-10-17 NOTE — Op Note (Signed)
[facility] Patient Name: Roger Vazquez Procedure Date : 10/17/2021 MRN: [id number] Attending MD: Docia Chuck. Henrene Pastor , MD Date of Birth: November 24, 1950 CSN: [id number] Age: 71 Admit Type: Inpatient Procedure:                Colonoscopy Indications:              Normocytic anemia, 2 g hemoglobin drop 2 months,                            heme-negative stool, negative CT. Providers:                Docia Chuck. Henrene Pastor, MD, Jeanella Cara, RN, Tyna Jaksch Technician Referring MD:             [facility] hospitalist Medicines:                Monitored Anesthesia Care Complications:            No immediate complications. Estimated blood loss:                            None. Estimated Blood Loss:     Estimated blood loss: none. Procedure:                Pre-Anesthesia Assessment:                           - Prior to the procedure, a History and Physical                            was performed, and patient medications and                            allergies were reviewed. The patient's tolerance of                            previous anesthesia was also reviewed. The risks                            and benefits of the procedure and the sedation                            options and risks were discussed with the patient.                            All questions were answered, and informed consent                            was obtained. Prior Anticoagulants: The patient has                            taken Eliquis (apixaban), last dose was 5 days  prior to procedure and heparin approximately 5                            hours preprocedure. ASA Grade Assessment: III - A                            patient with severe systemic disease. After                            reviewing the risks and benefits, the patient was                            deemed in satisfactory condition to undergo the                            procedure.                            After obtaining informed consent, the colonoscope                            was passed under direct vision. Throughout the                            procedure, the patient's blood pressure, pulse, and                            oxygen saturations were monitored continuously. The                            [id number]-HQ190L ([id number] Olympus coloscope was                            introduced through the anus and advanced to the the                            cecum, identified by appendiceal orifice and                            ileocecal valve. The ileocecal valve, appendiceal                            orifice, and rectum were photographed. The quality                            of the bowel preparation was good. The colonoscopy                            was performed without difficulty. The patient                            tolerated the procedure well. The bowel preparation  used was SUPREP via split dose instruction. Scope In: 10:37:00 AM Scope Out: 10:53:55 AM Scope Withdrawal Time: 0 hours 11 minutes 11 seconds  Total Procedure Duration: 0 hours 16 minutes 55 seconds  Findings:      Scattered diverticula were found in the left colon and right colon.      The exam was otherwise without abnormality on direct and retroflexion       views. Impression:               - Diverticulosis in the left colon and in the right                            colon.                           - The examination was otherwise normal on direct                            and retroflexion views.                           - No specimens collected. Recommendation:           - Repeat colonoscopy is not recommended for                            surveillance.                           - Okay to resume heparin today at prior dose and                            reinitiate Eliquis if needed for medical purposes.                           - Please see EGD regarding findings  and final                            recommendations. Procedure Code(s):        --- Professional ---                           [phone], Colonoscopy, flexible; diagnostic, including                            collection of specimen(s) by brushing or washing,                            when performed (separate procedure) Diagnosis Code(s):        --- Professional ---                           D62, Acute posthemorrhagic anemia                           K57.30, Diverticulosis of large intestine without  perforation or abscess without bleeding CPT copyright 2019 American Medical Association. All rights reserved. The codes documented in this report are preliminary and upon coder review may  be revised to meet current compliance requirements. Docia Chuck. Henrene Pastor, MD 10/17/2021 11:15:15 AM This report has been signed electronically. Number of Addenda: 0

## 2021-10-17 NOTE — Consult Note (Signed)
[facility] OF [place]  ASSESSMENT / PLAN: 71 y.o. male with right heel unstageable ulceration with underlying calcaneus osteomyelitis.  I counseled the patient extensively that his risk for limb loss is extremely high.  Revascularization not likely to lower this risk.  He is understanding and amenable to proceeding with right below-knee amputation.  We will plan to do this later this week.  CHIEF COMPLAINT: Right heel ulcer  HISTORY OF PRESENT ILLNESS: Roger Vazquez is a 71 y.o. male well-known to me, who is undergone multiple prior interventions for dialysis access.  In July I attempted to salvage his left lower extremity which had chronic limb threatening ischemia.  He underwent a successful for percutaneous intervention, but could not heal foot amputations and so required a left below-knee amputation 06/08/2021.  This is healed nicely.  Seen in our clinic 27/22 with a heel ulcer.  He was offered angiogram.  This is scheduled for later this month.  He returns to the hospital with symptomatic, severe anemia (hemoglobin 6.4).  He underwent upper endoscopy earlier today.  Past Medical History:  Diagnosis Date   Anemia    Blood dyscrasia    history of coagulation deficit per [facility] LPN nursing home   Cancer [facility])    Lung   Diabetes mellitus    Hepatitis    Hypertension    Renal disorder    MWF- [facility]    Past Surgical History:  Procedure Laterality Date   ABDOMINAL AORTOGRAM W/LOWER EXTREMITY Left 04/28/2021   Procedure: ABDOMINAL AORTOGRAM W/LOWER EXTREMITY;  Surgeon: Cherre Robins, MD;  Location: [place] [facility];  Service: Cardiovascular;  Laterality: Left;   AMPUTATION Left 05/04/2021   Procedure: LEFT SECOND TOE AMPUTATION;  Surgeon: Cherre Robins, MD;  Location: [place];  Service: Vascular;  Laterality: Left;   AMPUTATION Left 06/08/2021   Procedure: LEFT BELOW KNEE AMPUTATION;  Surgeon: Cherre Robins, MD;  Location: [place];  Service:  Vascular;  Laterality: Left;   APPLICATION OF WOUND VAC Left 06/08/2021   Procedure: APPLICATION OF WOUND VAC;  Surgeon: Cherre Robins, MD;  Location: [place];  Service: Vascular;  Laterality: Left;   artiovenous graft placement Left    AV FISTULA PLACEMENT Right 11/27/2018   Procedure: INSERTION 4-7MM X 45CM GORETEX GRAFT RIGHT FOREARM ;  Surgeon: Rosetta Posner, MD;  Location: [place];  Service: Vascular;  Laterality: Right;   [place] REMOVAL Left 08/06/2018   Procedure: REMOVAL OF LEFT ARM GRAFT;  Surgeon: Rosetta Posner, MD;  Location: [place];  Service: Vascular;  Laterality: Left;   INSERTION OF DIALYSIS CATHETER Right 08/08/2018   Procedure: INSERTION OF TUNNELED  DIALYSIS CATHETER;  Surgeon: Marty Heck, MD;  Location: [place];  Service: Vascular;  Laterality: Right;   IR DIALY SHUNT INTRO [place] W/IMG RIGHT Right 05/26/2021   IR REMOVAL TUN CV CATH W/O FL  02/19/2019   IR US GUIDE VASC ACCESS RIGHT  05/26/2021   LUNG SURGERY     patient not if they removed part of lung. Was done at [facility] Left 04/28/2021   Procedure: PERIPHERAL VASCULAR BALLOON ANGIOPLASTY;  Surgeon: Cherre Robins, MD;  Location: [place] [facility];  Service: Cardiovascular;  Laterality: Left;  popliteal and posterial tibial   TEE WITHOUT CARDIOVERSION N/A 07/31/2018   Procedure: TRANSESOPHAGEAL ECHOCARDIOGRAM (TEE);  Surgeon: Pixie Casino, MD;  Location: [place];  Service: Cardiovascular;  Laterality: N/A;  TRANSMETATARSAL AMPUTATION Left 05/22/2021   Procedure: LEFT TRANSMETATARSAL AMPUTATION;  Surgeon: Elam Dutch, MD;  Location: [place];  Service: Vascular;  Laterality: Left;    History reviewed. No pertinent family history.  Social History   Socioeconomic History   Marital status: Divorced    Spouse name: Not on file   Number of children: Not on file   Years of education: Not on file   Highest education level: Not on file   Occupational History   Occupation: retired  Tobacco Use   Smoking status: Former    Packs/day: 0.50    Years: 10.00    Pack years: 5.00    Types: Cigarettes    Quit date: 2001    Years since quitting: 22.0   Smokeless tobacco: Former    Quit date: 08/01/2015  Vaping Use   Vaping Use: Never used  Substance and Sexual Activity   Alcohol use: Not Currently    Alcohol/week: 2.0 standard drinks    Types: 2 Cans of beer per week   Drug use: Not Currently    Types: Marijuana    Comment: last time 11/19/2018   Sexual activity: Not on file    Comment: Pt uses vicodin  Other Topics Concern   Not on file  Social History Narrative   Not on file   Social Determinants of Health   Financial Resource Strain: Not on file  Food Insecurity: Not on file  Transportation Needs: Not on file  Physical Activity: Not on file  Stress: Not on file  Social Connections: Not on file  Intimate Partner Violence: Not on file    Allergies  Allergen Reactions   Sildenafil Other (See Comments)    Pt stated, "Makes my Blood Pressure drop"    Current Facility-Administered Medications  Medication Dose Route Frequency Provider Last Rate Last Admin   0.9 %  sodium chloride infusion  10 mL/hr Intravenous Once Irene Shipper, MD 10 mL/hr at 10/17/21 1018 Continued from Pre-op at 10/17/21 1018   acetaminophen (TYLENOL) tablet 650 mg  650 mg Oral Q6H PRN Irene Shipper, MD   650 mg at 10/14/21 1610   Or   acetaminophen (TYLENOL) suppository 650 mg  650 mg Rectal Q6H PRN Irene Shipper, MD       albuterol (PROVENTIL) (2.5 MG/3ML) 0.083% nebulizer solution 2.5 mg  2.5 mg Nebulization Q2H PRN Irene Shipper, MD       amLODipine (NORVASC) tablet 10 mg  10 mg Oral Daily Irene Shipper, MD   10 mg at 10/17/21 0952   calcium acetate (PHOSLO) capsule 667 mg  667 mg Oral TID WC Irene Shipper, MD   667 mg at 10/17/21 1214   calcium carbonate (dosed in mg elemental calcium) suspension 500 mg of elemental calcium  500 mg of  elemental calcium Oral Q6H PRN Irene Shipper, MD       camphor-menthol [facility]) lotion 1 application  1 application Topical R6E PRN Irene Shipper, MD       And   hydrOXYzine (ATARAX) tablet 25 mg  25 mg Oral Q8H PRN Irene Shipper, MD       Chlorhexidine Gluconate Cloth 2 % PADS 6 each  6 each Topical Q0600 Irene Shipper, MD   6 each at 10/17/21 0544   [START ON 10/20/2021] Darbepoetin Alfa (ARANESP) injection 100 mcg  100 mcg Intravenous Q Fri-HD Irene Shipper, MD       docusate sodium [facility]) enema  283 mg  1 enema Rectal PRN Irene Shipper, MD       feeding supplement (NEPRO CARB STEADY) liquid 237 mL  237 mL Oral TID BM Irene Shipper, MD   237 mL at 10/16/21 1044   ferrous sulfate tablet 325 mg  325 mg Oral Q breakfast Irene Shipper, MD   325 mg at 10/16/21 1043   heparin ADULT infusion 100 units/mL (25000 units/237mL)  750 Units/hr Intravenous Continuous Pham, Minh Q, RPH-CPP       hydrALAZINE (APRESOLINE) injection 5 mg  5 mg Intravenous Q4H PRN Irene Shipper, MD       insulin aspart (novoLOG) injection 0-6 Units  0-6 Units Subcutaneous TID WC Irene Shipper, MD   1 Units at 10/16/21 1747   lip balm (CARMEX) ointment   Topical PRN Irene Shipper, MD   75 application at 66/59/93 1523   multivitamin (RENA-VIT) tablet 1 tablet  1 tablet Oral QHS Irene Shipper, MD   1 tablet at 10/16/21 2136   ondansetron (ZOFRAN) tablet 4 mg  4 mg Oral Q6H PRN Irene Shipper, MD       Or   ondansetron [facility]) injection 4 mg  4 mg Intravenous Q6H PRN Irene Shipper, MD       oxyCODONE (Oxy IR/ROXICODONE) immediate release tablet 5 mg  5 mg Oral Q6H PRN Irene Shipper, MD   5 mg at 10/17/21 1219   pantoprazole (PROTONIX) injection 40 mg  40 mg Intravenous Q12H Irene Shipper, MD   40 mg at 10/17/21 5701   polyethylene glycol (MIRALAX / GLYCOLAX) packet 17 g  17 g Oral BID Irene Shipper, MD   17 g at 10/16/21 1043   senna (SENOKOT) tablet 8.6 mg  1 tablet Oral Daily Irene Shipper, MD   8.6 mg at 10/16/21 1043   sodium  chloride flush (NS) 0.9 % injection 3 mL  3 mL Intravenous Q12H Irene Shipper, MD   3 mL at 10/16/21 2142   sorbitol 70 % solution 30 mL  30 mL Oral PRN Irene Shipper, MD       zolpidem Lorrin Mais) tablet 5 mg  5 mg Oral QHS PRN Irene Shipper, MD   5 mg at 10/15/21 2149    REVIEW OF SYSTEMS:  [X]  denotes positive finding, [ ]  denotes negative finding Cardiac  Comments:  Chest pain or chest pressure:    Shortness of breath upon exertion:    Short of breath when lying flat:    Irregular heart rhythm:        Vascular    Pain in calf, thigh, or hip brought on by ambulation:    Pain in feet at night that wakes you up from your sleep:     Blood clot in your veins:    Leg swelling:         Pulmonary    Oxygen at home:    Productive cough:     Wheezing:         Neurologic    Sudden weakness in arms or legs:     Sudden numbness in arms or legs:     Sudden onset of difficulty speaking or slurred speech:    Temporary loss of vision in one eye:     Problems with dizziness:         Gastrointestinal    Blood in stool:     Vomited blood:  Genitourinary    Burning when urinating:     Blood in urine:        Psychiatric    Major depression:         Hematologic    Bleeding problems:    Problems with blood clotting too easily:        Skin    Rashes or ulcers:        Constitutional    Fever or chills:      PHYSICAL EXAM Vitals:   10/17/21 1132 10/17/21 1147 10/17/21 1202 10/17/21 1215  BP: 129/61 (!) 155/71 (!) 157/70 (!) 156/70  Pulse: 77 82 81 83  Resp: (!) 22 (!) 23 19 18   Temp:   98 F (36.7 C) 98.6 F (37 C)  TempSrc:      SpO2: 100% 100% 100% 98%  Weight:      Height:        Constitutional: Chronically ill appearing. no distress. Appears well nourished.  Neurologic: CN intact. no focal findings. no sensory loss. Psychiatric:  Mood and affect symmetric and appropriate. Eyes:  No icterus. No conjunctival pallor. Ears, nose, throat:  mucous membranes moist.  Midline trachea.  Cardiac: regular rate and rhythm.  Respiratory:  unlabored. Abdominal:  soft, non-tender, non-distended.  Peripheral vascular: no palpable R pedal pulses. L BKA well healed. Extremity: no edema. no cyanosis. no pallor.  Skin: no gangrene. Unstageable R heel ulceration.  Lymphatic: no Stemmer's sign. no palpable lymphadenopathy.  PERTINENT LABORATORY AND RADIOLOGIC DATA  Most recent CBC CBC Latest Ref Rng & Units 10/17/2021 10/16/2021 10/16/2021  WBC 4.0 - 10.5 K/uL 6.7 - 9.0  Hemoglobin 13.0 - 17.0 g/dL 8.4(L) 9.3(L) 8.0(L)  Hematocrit 39.0 - 52.0 % 25.1(L) 28.3(L) 23.9(L)  Platelets 150 - 400 K/uL 212 - 219     Most recent CMP CMP Latest Ref Rng & Units 10/17/2021 10/14/2021 10/12/2021  Glucose 70 - 99 mg/dL 154(H) 111(H) 161(H)  BUN 8 - 23 mg/dL 22 17 21   Creatinine 0.61 - 1.24 mg/dL 4.71(H) 4.71(H) 5.62(H)  Sodium 135 - 145 mmol/L 135 137 138  Potassium 3.5 - 5.1 mmol/L 4.2 3.9 4.3  Chloride 98 - 111 mmol/L 100 98 95(L)  CO2 22 - 32 mmol/L 22 29 32  Calcium 8.9 - 10.3 mg/dL 8.9 8.9 9.1  Total Protein 6.5 - 8.1 g/dL - - 8.6(H)  Total Bilirubin 0.3 - 1.2 mg/dL - - 0.5  Alkaline Phos 38 - 126 U/L - - 79  AST 15 - 41 U/L - - 24  ALT 0 - 44 U/L - - 15    Renal function Estimated Creatinine Clearance: 10.6 mL/min (A) (by C-G formula based on SCr of 4.71 mg/dL (H)).  Hgb A1c MFr Bld (%)  Date Value  04/27/2021 7.9 (H)    LDL Cholesterol  Date Value Ref Range Status  04/29/2021 28 0 - 99 mg/dL Final    Comment:           Total Cholesterol/HDL:CHD Risk Coronary Heart Disease Risk Table                     Men   Women  1/2 Average Risk   3.4   3.3  Average Risk       5.0   4.4  2 X Average Risk   9.6   7.1  3 X Average Risk  23.4   11.0        Use the calculated Patient Ratio above and  the CHD Risk Table to determine the patient's CHD Risk.        ATP III CLASSIFICATION (LDL):  <100     mg/dL   Optimal  100-129  mg/dL   Near or Above                     Optimal  130-159  mg/dL   Borderline  160-189  mg/dL   High  >190     mg/dL   Very High Performed at [facility] [address] [place], [place] [address]     [name]. Stanford Breed, MD [facility] Phone Number: [phone] 10/17/2021 1:21 PM  Total time spent on preparing this encounter including chart review, data review, collecting history, examining the patient, coordinating care for this established patient, 40 minutes.  Portions of this report may have been transcribed using voice recognition software.  Every effort has been made to ensure accuracy; however, inadvertent computerized transcription errors may still be present.

## 2021-10-17 NOTE — Anesthesia Procedure Notes (Signed)
Procedure Name: MAC Date/Time: 10/17/2021 10:20 AM Performed by: Griffin Dakin, CRNA Pre-anesthesia Checklist: Patient identified, Emergency Drugs available, Suction available, Patient being monitored and Timeout performed Patient Re-evaluated:Patient Re-evaluated prior to induction Oxygen Delivery Method: Simple face mask Induction Type: IV induction Placement Confirmation: positive ETCO2 and breath sounds checked- equal and bilateral Dental Injury: Teeth and Oropharynx as per pre-operative assessment

## 2021-10-17 NOTE — Transfer of Care (Signed)
Immediate Anesthesia Transfer of Care Note  Patient: Roger Vazquez  Procedure(s) Performed: COLONOSCOPY WITH PROPOFOL ESOPHAGOGASTRODUODENOSCOPY (EGD) WITH PROPOFOL BIOPSY  Patient Location: PACU  Anesthesia Type:MAC  Level of Consciousness: awake, alert  and oriented  Airway & Oxygen Therapy: Patient Spontanous Breathing and Patient connected to face mask oxygen  Post-op Assessment: Report given to RN and Post -op Vital signs reviewed and stable  Post vital signs: Reviewed and stable  Last Vitals:  Vitals Value Taken Time  BP 109/57 10/17/21 1117  Temp    Pulse 78 10/17/21 1119  Resp 23 10/17/21 1119  SpO2 100 % 10/17/21 1119  Vitals shown include unvalidated device data.  Last Pain:  Vitals:   10/17/21 1002  TempSrc: Temporal  PainSc: 8       Patients Stated Pain Goal: 0 (71/21/97 5883)  Complications: No notable events documented.

## 2021-10-17 NOTE — Consult Note (Signed)
[facility]    Date of Admission:  10/12/2021     Total days of antibiotics 0  1 day of 1gm ceftriaxone 12/31        Reason for Consult: Calcaneal Osteomyelitis    Referring Provider: [facility] Primary Care Provider: [facility]   Assessment: Roger Vazquez is a 71 y.o. male admitted with severe symptomatic anemia and chronic right heel ulcerations in the setting of arterial disease. 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 Vascular is planning on below the knee amputation. He has not had any systemic symptoms to reflect he was significantly ill from his foot. Unfortunately curative approach will be to proceed with amputation - which he agrees to. If amputation is proximal enough to obtain clean margins and no concern over residual infected bone would be able to stop all antibiotics.     Plan: Stop antibiotics following amputation  Would not plan to start them now given he is not systemically ill with definitive surgical plan.     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)   HTN (hypertension)   ESRD on hemodialysis (HCC)   Heel ulcer, right, with unspecified severity (HCC)   DVT (deep venous thrombosis) (HCC)   Lymphadenopathy   Chronic anticoagulation   Diverticulosis of colon without hemorrhage   Acute gastric ulcer without hemorrhage or perforation    amLODipine  10 mg Oral Daily   calcium acetate  667 mg Oral TID WC   Chlorhexidine Gluconate Cloth  6 each Topical Q0600   [START ON 10/20/2021] darbepoetin (ARANESP) injection - DIALYSIS  100 mcg Intravenous Q Fri-HD   feeding supplement (NEPRO CARB STEADY)  237 mL Oral TID BM   ferrous sulfate  325 mg Oral Q breakfast   insulin aspart  0-6 Units Subcutaneous TID WC   multivitamin  1 tablet Oral QHS   pantoprazole  (PROTONIX) IV  40 mg Intravenous Q12H   polyethylene glycol  17 g Oral BID   senna  1 tablet Oral Daily   sodium chloride flush  3 mL Intravenous Q12H     HPI: Roger Vazquez is a 71 y.o. male admitted to the hospital with symptomatic severe anemia. He is a resident at [place]. 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   During hospitalization for anemia realized he had chronic ulcerations to the right heel - MRI revealed concern for acute osteomyelitis of the calcaneus. There was attempts outpatient for angiography however he required acute hospitalization for anemia/GIB.   We were consulted to help with treatment of right heel osteomyelitis.   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 Since the problems on the right heel have come up he has not had any chills/systemic infectious symptoms that he has noticed. No allergise to antibiotics. Vascular team (Dr. Stanford Breed) is following and recommended amputation.    Review of Systems: Review of Systems  Constitutional:  Negative for chills and fever.  HENT:  Negative for tinnitus.   Eyes:  Negative for blurred vision and photophobia.  Respiratory:  Negative for cough and sputum production.   Cardiovascular:  Negative for chest pain.  Gastrointestinal:  Negative for diarrhea, nausea and vomiting.  Genitourinary:  Negative for dysuria.  Skin:  Negative for rash.  Neurological:  Negative for headaches.   Past Medical History:  Diagnosis Date   Anemia    Blood dyscrasia    history of coagulation deficit per [facility] home   Cancer [facility])    Lung   Diabetes mellitus    Hepatitis    Hypertension    Renal disorder    MWF- [place]    Social History   Tobacco Use   Smoking status: Former    Packs/day: 0.50    Years: 10.00    Pack years: 5.00    Types: Cigarettes    Quit date: 2001    Years since  quitting: 22.0   Smokeless tobacco: Former    Quit date: 08/01/2015  Vaping Use   Vaping Use: Never used  Substance Use Topics   Alcohol use: Not Currently    Alcohol/week: 2.0 standard drinks    Types: 2 Cans of beer per week   Drug use: Not Currently    Types: Marijuana    Comment: last time 11/19/2018    History reviewed. No pertinent family history. Allergies  Allergen Reactions   Sildenafil Other (See Comments)    Pt stated, "Makes my Blood Pressure drop"    OBJECTIVE: Blood pressure (!) 156/70, pulse 83, temperature 98.6 F (37 C), resp. rate 18, height 5\' 6"  (1.676 m), weight 52.1 kg, SpO2 98 %.  Physical Exam Vitals reviewed.  Constitutional:      Appearance: Normal appearance. He is not ill-appearing.     Comments: Resting quietly in bed. No distress.   HENT:     Head: Normocephalic.     Mouth/Throat:     Mouth: Mucous membranes are moist.     Pharynx: Oropharynx is clear.  Eyes:     General: No scleral icterus. Cardiovascular:     Rate and Rhythm: Normal rate.  Pulmonary:     Effort: Pulmonary effort is normal.  Musculoskeletal:        General: Normal range of motion.     Cervical back: Normal range of motion.     Comments: Rt foot in padded boot. Dark discoloration to skin on foot noted.   Skin:    Coloration: Skin is not jaundiced or pale.  Neurological:     Mental Status: He is alert and oriented to person, place, and time.  Psychiatric:        Mood and Affect: Mood normal.        Judgment: Judgment normal.    Lab Results Lab Results  Component Value Date   WBC 6.7 10/17/2021   HGB 8.4 (L) 10/17/2021   HCT 25.1 (L) 10/17/2021   MCV 94.4 10/17/2021   PLT 212 10/17/2021    Lab Results  Component Value Date   CREATININE 4.71 (H) 10/17/2021   BUN 22 10/17/2021   NA 135 10/17/2021   K 4.2 10/17/2021   CL 100 10/17/2021   CO2 22 10/17/2021    Lab Results  Component Value Date   ALT 15 10/12/2021   AST 24 10/12/2021   ALKPHOS 79  10/12/2021   BILITOT 0.5 10/12/2021     Microbiology: Recent Results (from the past 240 hour(s))  Resp Panel by RT-PCR (Flu A&B, Covid) Nasopharyngeal Swab     Status: None   Collection Time: 10/13/21  5:00 AM   Specimen: Nasopharyngeal Swab; Nasopharyngeal(NP) swabs in vial transport medium  Result Value Ref Range Status   SARS Coronavirus 2 by RT PCR  NEGATIVE NEGATIVE Final    Comment: (NOTE) SARS-CoV-2 target nucleic acids are NOT DETECTED.  The SARS-CoV-2 RNA is generally detectable in upper respiratory specimens during the acute phase of infection. The lowest concentration of SARS-CoV-2 viral copies this assay can detect is 138 copies/mL. A negative result does not preclude SARS-Cov-2 infection and should not be used as the sole basis for treatment or other patient management decisions. A negative result may occur with  improper specimen collection/handling, submission of specimen other than nasopharyngeal swab, presence of viral mutation(s) within the areas targeted by this assay, and inadequate number of viral copies(<138 copies/mL). A negative result must be combined with clinical observations, patient history, and epidemiological information. The expected result is Negative.  Fact Sheet for Patients:  EntrepreneurPulse.com.au  Fact Sheet for Healthcare Providers:  IncredibleEmployment.be  This test is no t yet approved or cleared by the [place] FDA and  has been authorized for detection and/or diagnosis of SARS-CoV-2 by FDA under an Emergency Use Authorization (EUA). This EUA will remain  in effect (meaning this test can be used) for the duration of the COVID-19 declaration under Section 564(b)(1) of the Act, 21 U.S.C.section 360bbb-3(b)(1), unless the authorization is terminated  or revoked sooner.       Influenza A by PCR NEGATIVE NEGATIVE Final   Influenza B by PCR NEGATIVE NEGATIVE Final    Comment: (NOTE) The Xpert  Xpress SARS-CoV-2/FLU/RSV plus assay is intended as an aid in the diagnosis of influenza from Nasopharyngeal swab specimens and should not be used as a sole basis for treatment. Nasal washings and aspirates are unacceptable for Xpert Xpress SARS-CoV-2/FLU/RSV testing.  Fact Sheet for Patients: EntrepreneurPulse.com.au  Fact Sheet for Healthcare Providers: IncredibleEmployment.be  This test is not yet approved or cleared by the [place] FDA and has been authorized for detection and/or diagnosis of SARS-CoV-2 by FDA under an Emergency Use Authorization (EUA). This EUA will remain in effect (meaning this test can be used) for the duration of the COVID-19 declaration under Section 564(b)(1) of the Act, 21 U.S.C. section 360bbb-3(b)(1), unless the authorization is terminated or revoked.  Performed at [facility], [place] [address] [place], [place] [address]   Urine Culture     Status: None   Collection Time: 10/14/21 10:40 AM   Specimen: Urine, Clean Catch  Result Value Ref Range Status   Specimen Description URINE, CLEAN CATCH  Final   Special Requests NONE  Final   Culture   Final    NO GROWTH Performed at [facility], [place] [address], [place],  [address]    Report Status 10/15/2021 FINAL  Final    Janene Madeira, MSN, NP-C [facility] [address] [facility] Pager: [phone]  10/17/2021 3:16 PM

## 2021-10-17 NOTE — Progress Notes (Signed)
PROGRESS NOTE    Roger Vazquez  WPY:[id number] DOB: 1951/04/02 DOA: 10/12/2021 PCP: [facility]   Brief Narrative: 71 year old with past medical history significant for ESRD on hemodialysis MWF, PAD status post left BKA, diabetes, hypertension presented with a hemoglobin of 6.4, lab work performed at skilled nursing facility.  Reported feeling weak and tired.  He does not know if he has had bloody stool.  Patient admitted with anemia, hemoglobin a month ago was 12. He was admitted for further evaluation of  anemia.  GI has been consulted and underwent  endoscopy and  colonoscopy 20/12/2021.  Endoscopy showed a small gastric ulcer.  Colonoscopy diverticulosis.  Patient will need PPI daily.  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  No fluid collection or abscess. Vascular is recommending BKA. ID was also consulted.     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  GI consulted, For endoscopy /colonoscopy tomorrow.  Received one unit PRBC>  Hb 6.6---7.4--7.8--7.5--8.0--8.4 IV Protonix.   DVT:  Patient with a prior history of DVT  diagnosed 08/13/2021. He has been on Eliquis, continue to hold for now. Repeated doppler with chronic DVT SF junction  Hold eliquis in anticipation Sx. Continue with heparin Gtt./    Heel ulcer, right with unspecified severity: Calcaneous Osteomyelitis.  He has been followed by vascular.  He had failed ABIs recently  with inadequate perfusion to promote wound healing. 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  No fluid collection or abscess. ID consulted for osteomyelitis.  Vascular consulted, Dr Roselie Awkward recommend BKA>   ESRD  on hemodialysis: Nephrology Consulted and following He had HD 1/02  Hypertension: Continue with Norvasc  Type 2 diabetes with chronic kidney disease on chronic dialysis: Prior A1c 7.9 Continue With SSI   Markedly distended and enlarged bladder with thickened bladder wall and inflammation surrounding bladder. Findings are consistent with cystitis. In and out cath perform yield 500 cc urine.  UA with large leukocyte.  Received one dose of ceftriaxone. Urine culture no growth.   See wound care documentation below.   Pressure Injury 10/13/21 Right Unstageable - Full thickness tissue loss in which the base of the injury is covered by slough (yellow, tan, gray, green or brown) and/or eschar (tan, brown or black) in the wound bed. (Active)  10/13/21   Location:   Location Orientation: Right  Staging: Unstageable - Full thickness tissue loss in which the base of the injury is covered by slough (yellow, tan, gray, green or brown) and/or eschar (tan, brown or black) in the wound bed.  Wound Description (Comments):   Present on Admission: Yes      Estimated body mass index is 18.54 kg/m as calculated from the following:   Height as of this encounter: 5\' 6"  (1.676 m).   Weight as of this encounter: 52.1 kg.  DVT prophylaxis: SCD Code Status: Full code Family Communication: Sister 1/03 Disposition Plan:  Status is: Inpatient  Remains inpatient appropriate because: evaluation for anemia         Consultants:  GI Nephrology   Procedures:     Antimicrobials:  Ceftriaxone   Subjective: I saw patient prior to endoscopy.  He denies pain.   Objective: Vitals:   10/16/21 1120 10/16/21 2130 10/17/21 0535 10/17/21 0918  BP: (!) 144/71 136/68 140/72 (!) 168/81  Pulse: 94 88 91 90  Resp: 18 18 18 18   Temp: 98.8 F (37.1 C) 98 F (36.7 C) 97.9 F (36.6 C) 98.4 F (36.9 C)  TempSrc: Oral   Oral  SpO2: 98% 98% 99% 100%  Weight:      Height:        Intake/Output Summary (Last 24 hours) at 10/17/2021 0957 Last data filed at 10/17/2021 1740 Gross per 24 hour  Intake 0 ml  Output 1147 ml  Net -1147 ml    Filed Weights   10/14/21 0451 10/16/21 0653 10/16/21 1012  Weight: 51.5 kg 52.8 kg 52.1 kg    Examination:  General exam: NAD Respiratory system: CTA Cardiovascular system: S 1, S 2 RRR Gastrointestinal system: BS present, soft, nt Central nervous system: Alert, follows command Extremities: Right heel with dressing  Data Reviewed: I have personally reviewed following labs and imaging studies  CBC: Recent Labs  Lab 10/12/21 2231 10/13/21 1606 10/14/21 0328 10/15/21 0407 10/16/21 0801 10/16/21 1842 10/17/21 0221  WBC 9.3 9.5 7.5 8.6 9.0  --  6.7  NEUTROABS 6.3  --   --   --   --   --   --   HGB 6.6* 7.4* 7.8* 7.5* 8.0* 9.3* 8.4*  HCT 20.6* 22.5* 22.7* 22.9* 23.9* 28.3* 25.1*  MCV 95.8 93.0 93.4 94.2 95.2  --  94.4  PLT 263 234 219 218 219  --  814    Basic Metabolic Panel: Recent Labs  Lab 10/12/21 2231 10/14/21 0328 10/17/21 0221  NA 138 137 135  K 4.3 3.9 4.2  CL 95* 98 100  CO2 32 29 22  GLUCOSE 161* 111* 154*  BUN 21 17 22   CREATININE 5.62* 4.71* 4.71*  CALCIUM 9.1 8.9 8.9  PHOS  --   --  1.6*    GFR: Estimated Creatinine Clearance: 10.6 mL/min (A) (by C-G formula based on SCr of 4.71 mg/dL (H)). Liver Function Tests: Recent Labs  Lab 10/12/21 2231 10/17/21 0221  AST 24  --   ALT 15  --   ALKPHOS 79  --   BILITOT 0.5  --   PROT 8.6*  --   ALBUMIN 3.3* 3.0*    No results  for input(s): LIPASE, AMYLASE in the last 168 hours. No results for input(s): AMMONIA in the last 168 hours. Coagulation Profile: Recent Labs  Lab 10/13/21 1300  INR 1.1    Cardiac Enzymes: No results for input(s): CKTOTAL, CKMB, CKMBINDEX, TROPONINI in the last 168 hours. BNP (last 3 results) No results for input(s): PROBNP in the last 8760 hours. HbA1C: No results for input(s): HGBA1C in the last 72 hours. CBG: Recent Labs  Lab 10/16/21 0635 10/16/21 1126 10/16/21 1746 10/16/21 2140 10/17/21 0727  GLUCAP 153* 158* 156* 114* 141*    Lipid Profile: No results for input(s): CHOL, HDL, LDLCALC, TRIG, CHOLHDL, LDLDIRECT in the last 72 hours. Thyroid Function Tests: No results for input(s): TSH, T4TOTAL, FREET4, T3FREE, THYROIDAB in the last 72 hours. Anemia Panel: No  results for input(s): VITAMINB12, FOLATE, FERRITIN, TIBC, IRON, RETICCTPCT in the last 72 hours.  Sepsis Labs: No results for input(s): PROCALCITON, LATICACIDVEN in the last 168 hours.  Recent Results (from the past 240 hour(s))  Resp Panel by RT-PCR (Flu A&B, Covid) Nasopharyngeal Swab     Status: None   Collection Time: 10/13/21  5:00 AM   Specimen: Nasopharyngeal Swab; Nasopharyngeal(NP) swabs in vial transport medium  Result Value Ref Range Status   SARS Coronavirus 2 by RT PCR NEGATIVE NEGATIVE Final    Comment: (NOTE) SARS-CoV-2 target nucleic acids are NOT DETECTED.  The SARS-CoV-2 RNA is generally detectable in upper respiratory specimens during the acute phase of infection. The lowest concentration of SARS-CoV-2 viral copies this assay can detect is 138 copies/mL. A negative result does not preclude SARS-Cov-2 infection and should not be used as the sole basis for treatment or other patient management decisions. A negative result may occur with  improper specimen collection/handling, submission of specimen other than nasopharyngeal swab, presence of viral mutation(s) within the areas targeted by  this assay, and inadequate number of viral copies(<138 copies/mL). A negative result must be combined with clinical observations, patient history, and epidemiological information. The expected result is Negative.  Fact Sheet for Patients:  EntrepreneurPulse.com.au  Fact Sheet for Healthcare Providers:  IncredibleEmployment.be  This test is no t yet approved or cleared by the [place] FDA and  has been authorized for detection and/or diagnosis of SARS-CoV-2 by FDA under an Emergency Use Authorization (EUA). This EUA will remain  in effect (meaning this test can be used) for the duration of the COVID-19 declaration under Section 564(b)(1) of the Act, 21 U.S.C.section 360bbb-3(b)(1), unless the authorization is terminated  or revoked sooner.       Influenza A by PCR NEGATIVE NEGATIVE Final   Influenza B by PCR NEGATIVE NEGATIVE Final    Comment: (NOTE) The Xpert Xpress SARS-CoV-2/FLU/RSV plus assay is intended as an aid in the diagnosis of influenza from Nasopharyngeal swab specimens and should not be used as a sole basis for treatment. Nasal washings and aspirates are unacceptable for Xpert Xpress SARS-CoV-2/FLU/RSV testing.  Fact Sheet for Patients: EntrepreneurPulse.com.au  Fact Sheet for Healthcare Providers: IncredibleEmployment.be  This test is not yet approved or cleared by the [place] FDA and has been authorized for detection and/or diagnosis of SARS-CoV-2 by FDA under an Emergency Use Authorization (EUA). This EUA will remain in effect (meaning this test can be used) for the duration of the COVID-19 declaration under Section 564(b)(1) of the Act, 21 U.S.C. section 360bbb-3(b)(1), unless the authorization is terminated or revoked.  Performed at [facility], [place] [address], [place], [place] [address]   Urine Culture     Status: None   Collection Time: 10/14/21 10:40 AM    Specimen: Urine, Clean Catch  Result Value Ref Range Status   Specimen Description URINE, CLEAN CATCH  Final   Special Requests NONE  Final   Culture   Final    NO GROWTH Performed at [facility], [place] [address] [place], [place] [address]    Report Status 10/15/2021 FINAL  Final          Radiology Studies: MR FOOT RIGHT WO CONTRAST  Result Date: 10/17/2021 CLINICAL DATA:  Limping, acute foot pain, infection suspected. Evaluate right heel ulcer history of diabetes. EXAM: MRI OF THE RIGHT FOREFOOT WITHOUT CONTRAST TECHNIQUE: Multiplanar, multisequence MR imaging of the right hindfoot was performed. No intravenous contrast was administered.  COMPARISON:  Radiographs dated October 12, 2021 FINDINGS: Bones/Joint/Cartilage T1 hypointense and hyperintense signal on STIR and T2 sequences on the lateral aspect of the calcaneus which in the presence of adjacent deep skin wound is highly suspicious for acute osteomyelitis. There is susceptibility artifact from hardware in the distal tibia. 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 No drainable fluid collection or abscess. The tendons of the flexor, extensor and peroneal compartments are intact. Mild tendinopathy of the Achilles tendon without tear. Soft tissues Skin irregularity about the plantar aspect of the calcaneus consistent with skin wound without evidence of drainable fluid collection or abscess. Mild subcutaneous soft tissue edema about the plantar soft tissues. IMPRESSION: 1. Abnormal marrow signal of the lateral aspect of the calcaneus which in the presence of adjacent deep skin wound is highly suspicious for acute osteomyelitis. 2. 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 No drainable fluid collection or abscess. Electronically Signed   By: Keane Police D.O.   On:  10/17/2021 08:11   VAS [place] LOWER EXTREMITY VENOUS (DVT)  Result Date: 10/16/2021  Lower Venous DVT Study Patient Name:  Roger Vazquez  Date of Exam:   10/16/2021 Medical Rec #: [id number]         Accession #:    [id number] Date of Birth: 1951-09-25         Patient Gender: M Patient Age:   1 years Exam Location:  [facility] Procedure:      VAS [place] LOWER EXTREMITY VENOUS (DVT) Referring Phys: Jerald Kief Devell Parkerson --------------------------------------------------------------------------------  Indications: Follow up exam.  Anticoagulation: Eliquis (before hospitalization). Comparison       Previous exam on 08/12/2021 was positive for DVT in LLE CFV & Study:           SFJ Performing Technologist: Rogelia Rohrer RVT, RDMS  Examination Guidelines: A complete evaluation includes B-mode imaging, spectral Doppler, color Doppler, and power Doppler as needed of all accessible portions of each vessel. Bilateral testing is considered an integral part of a complete examination. Limited examinations for reoccurring indications may be performed as noted. The reflux portion of the exam is performed with the patient in reverse Trendelenburg.  +---------+---------------+---------+-----------+----------+--------------+  RIGHT     Compressibility Phasicity Spontaneity Properties Thrombus Aging  +---------+---------------+---------+-----------+----------+--------------+  CFV       Full            Yes       Yes                                    +---------+---------------+---------+-----------+----------+--------------+  SFJ       Partial                                          Chronic         +---------+---------------+---------+-----------+----------+--------------+  FV Prox   Full            Yes       Yes                                    +---------+---------------+---------+-----------+----------+--------------+  FV Mid    Full  Yes       Yes                                     +---------+---------------+---------+-----------+----------+--------------+  FV Distal Full            Yes       Yes                                    +---------+---------------+---------+-----------+----------+--------------+  PFV       Full                                                             +---------+---------------+---------+-----------+----------+--------------+  POP       Full            Yes       Yes                                    +---------+---------------+---------+-----------+----------+--------------+  PTV                                                        BKA             +---------+---------------+---------+-----------+----------+--------------+  PERO                                                       BKA             +---------+---------------+---------+-----------+----------+--------------+ Small area of residual thrombus noted in the SFJ  +----+---------------+---------+-----------+----------+--------------+  LEFT Compressibility Phasicity Spontaneity Properties Thrombus Aging  +----+---------------+---------+-----------+----------+--------------+  CFV  Full            Yes       Yes                                    +----+---------------+---------+-----------+----------+--------------+     Summary: RIGHT: - No evidence of common femoral vein obstruction. - Ultrasound characteristics of enlarged lymph nodes are noted in the groin.  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 - Findings appear improved from previous examination. - There is no evidence of superficial venous thrombosis.  - No cystic structure found in the popliteal fossa. - Ultrasound characteristics of enlarged lymph nodes noted in the groin.  *See table(s) above for measurements and observations. Electronically signed by Monica Martinez MD on 10/16/2021 at 3:01:01 PM.    Final         Scheduled Meds:  amLODipine  10 mg Oral Daily   calcium acetate  667 mg Oral TID  WC   Chlorhexidine Gluconate Cloth  6 each Topical Q0600   [START  ON 10/20/2021] darbepoetin (ARANESP) injection - DIALYSIS  100 mcg Intravenous Q Fri-HD   feeding supplement (NEPRO CARB STEADY)  237 mL Oral TID BM   ferrous sulfate  325 mg Oral Q breakfast   insulin aspart  0-6 Units Subcutaneous TID WC   multivitamin  1 tablet Oral QHS   pantoprazole (PROTONIX) IV  40 mg Intravenous Q12H   polyethylene glycol  17 g Oral BID   senna  1 tablet Oral Daily   sodium chloride flush  3 mL Intravenous Q12H   Continuous Infusions:  sodium chloride       LOS: 4 days    Time spent: 35 minutes.     Elmarie Shiley, MD [facility]   If 7PM-7AM, please contact night-coverage [email or web address]  10/17/2021, 9:57 AM

## 2021-10-17 NOTE — Progress Notes (Signed)
ANTICOAGULATION CONSULT NOTE - Initial Consult  Pharmacy Consult for heparin  Indication: DVT  Allergies  Allergen Reactions   Sildenafil Other (See Comments)    Pt stated, "Makes my Blood Pressure drop"    Patient Measurements: Height: 5\' 6"  (167.6 cm) Weight: 52.1 kg (114 lb 13.8 oz) IBW/kg (Calculated) : 63.8   Vital Signs: Temp: 98.6 F (37 C) (01/03 1215) Temp Source: Temporal (01/03 1002) BP: 156/70 (01/03 1215) Pulse Rate: 83 (01/03 1215)  Labs: Recent Labs    10/15/21 0407 10/16/21 0801 10/16/21 1738 10/16/21 1842 10/17/21 0221  HGB 7.5* 8.0*  --  9.3* 8.4*  HCT 22.9* 23.9*  --  28.3* 25.1*  PLT 218 219  --   --  212  HEPARINUNFRC  --   --  0.88*  --   --   CREATININE  --   --   --   --  4.71*     Estimated Creatinine Clearance: 10.6 mL/min (A) (by C-G formula based on SCr of 4.71 mg/dL (H)).   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   Past Week   amLODipine (NORVASC) 10 MG tablet Take 1 tablet (10 mg total) by mouth daily.   10/12/2021   apixaban (ELIQUIS) 5 MG TABS tablet Take 1 tablet (5 mg total) by mouth 2 (two) times daily. 60 tablet  10/12/2021 at 9am   aspirin 81 MG chewable tablet Chew 81 mg by mouth daily. (0800)   10/12/2021   calcium acetate (PHOSLO) 667 MG capsule Take 667 mg by mouth 3 (three) times daily with meals. (0800, 1300 & 1700)   10/12/2021   ferrous sulfate 325 (65 FE) MG tablet Take 325 mg by mouth daily with breakfast.   10/13/2021   hydrocortisone (ANUSOL-HC) 25 MG suppository Place 25 mg rectally 2 (two) times daily as  needed for hemorrhoids or anal itching.   unk   lidocaine-prilocaine (EMLA) cream Apply 1 application topically See admin instructions. As needed on dialysis days.Laurine Blazer and Friday)   Past Week   Multiple Vitamin (MULITIVITAMIN WITH MINERALS) TABS Take 1 tablet by mouth daily. 30 tablet 0 10/12/2021   Nutritional Supplements (NOVASOURCE RENAL PO) Take 237 mLs by mouth daily.   Past Week   oxyCODONE (OXY IR/ROXICODONE) 5 MG immediate release tablet Take 5 mg by mouth 2 (two) times daily as needed for severe pain.   10/12/2021   polyethylene glycol (MIRALAX / GLYCOLAX) 17 g packet Take 17 g by mouth 2 (two) times daily. 14 each 0 Past Week   senna (SENOKOT) 8.6 MG TABS tablet Take 1 tablet (8.6 mg total) by mouth daily. 7 tablet 0 10/12/2021   apixaban (ELIQUIS) 5 MG TABS tablet Take 2 tablets (10 mg total) by mouth 2 (two) times daily for 7 doses. (Patient not taking: Reported on 10/13/2021) 60 tablet  Not Taking   Scheduled:   amLODipine  10 mg Oral Daily   calcium acetate  667 mg Oral TID WC   Chlorhexidine Gluconate Cloth  6 each Topical Q0600   [START ON 10/20/2021] darbepoetin (ARANESP) injection - DIALYSIS  100 mcg Intravenous Q Fri-HD   feeding supplement (NEPRO CARB STEADY)  237 mL Oral TID BM   ferrous sulfate  325 mg Oral Q breakfast   insulin aspart  0-6 Units Subcutaneous TID WC   multivitamin  1 tablet Oral QHS   pantoprazole (PROTONIX) IV  40 mg Intravenous Q12H   polyethylene glycol  17 g Oral BID   senna  1 tablet Oral Daily   sodium chloride flush  3 mL Intravenous Q12H    Assessment: 71 yo male with recent DVT (07/2021) on apixaban PTA (last dose taken is unclear). He is noted with symptomatic anemia with plans for endoscopy/colonoscopy on 1/3. Pharmacy consulted to dose heparin. He is noted with ESRD  Pt is s/p EGD and colonoscopy. Ok to resume anticoagulation per GI. Dr. Nestor Ramp would like to use heparin for now due to potential for BKA if needed. We will use  PTT to monitor until correlation with heparin level.   Goal of Therapy:  Heparin level= 0.3-0.7 aPTT 66-102 Monitor platelets by anticoagulation protocol: Yes   Plan:  Restart heparin at 750 units/hr Check 8 hr PTT Daily HL/PTT/CBC F/u resume apixaban  Onnie Boer, PharmD, BCIDP, AAHIVP, CPP Infectious Disease Pharmacist 10/17/2021 1:18 PM

## 2021-10-17 NOTE — Op Note (Signed)
[facility] Patient Name: Roger Vazquez Procedure Date : 10/17/2021 MRN: [id number] Attending MD: Docia Chuck. Henrene Pastor , MD Date of Birth: Jul 28, 1951 CSN: [id number] Age: 71 Admit Type: Inpatient Procedure:                Upper GI endoscopy with biopsies Indications:              Normocytic anemia Providers:                Docia Chuck. Henrene Pastor, MD, Jeanella Cara, RN, Tyna Jaksch Technician Referring MD:             [facility] hospitalist Medicines:                Monitored Anesthesia Care Complications:            No immediate complications. Estimated Blood Loss:     Estimated blood loss: none. Procedure:                Pre-Anesthesia Assessment:                           - Prior to the procedure, a History and Physical                            was performed, and patient medications and                            allergies were reviewed. The patient's tolerance of                            previous anesthesia was also reviewed. The risks                            and benefits of the procedure and the sedation                            options and risks were discussed with the patient.                            All questions were answered, and informed consent                            was obtained. Prior Anticoagulants: The patient has                            taken Eliquis (apixaban), last dose was 5 days                            prior to procedure. ASA Grade Assessment: III - A                            patient with severe systemic disease. After  reviewing the risks and benefits, the patient was                            deemed in satisfactory condition to undergo the                            procedure.                           After obtaining informed consent, the endoscope was                            passed under direct vision. Throughout the                            procedure, the patient's blood  pressure, pulse, and                            oxygen saturations were monitored continuously. The                            [id number]-H190 ([id number]) Olympus endoscope was introduced                            through the mouth, and advanced to the second part                            of duodenum. The upper GI endoscopy was                            accomplished without difficulty. The patient                            tolerated the procedure well. Scope In: Scope Out: Findings:      The esophagus was normal.      One non-bleeding superficial gastric ulcer with no stigmata of bleeding       was found in the prepyloric region of the stomach. The lesion was 2 mm       in largest dimension. Biopsies were taken with a cold forceps for       histology.      The stomach revealed small volume retained gastric contents. See image.       Stomach was otherwise normal.      The examined duodenum was normal.      The cardia and gastric fundus were normal on retroflexion. Impression:               1. Tiny gastric ulcer without stigmata                           2. Retained gastric contents                           3. Otherwise normal EGD. Recommendation:           1. Recommend pantoprazole 40 mg daily INDEFINITELY  2. Follow-up biopsies (we will).                           3. Okay to resume anticoagulation as you wish                           4. Resume previous diet.                           5. No outpatient GI follow-up is required. GI will                            sign off. Thank you Procedure Code(s):        --- Professional ---                           [phone], Esophagogastroduodenoscopy, flexible,                            transoral; with biopsy, single or multiple Diagnosis Code(s):        --- Professional ---                           K25.9, Gastric ulcer, unspecified as acute or                            chronic, without hemorrhage or perforation                            D50.9, Iron deficiency anemia, unspecified CPT copyright 2019 American Medical Association. All rights reserved. The codes documented in this report are preliminary and upon coder review may  be revised to meet current compliance requirements. Docia Chuck. Henrene Pastor, MD 10/17/2021 11:22:14 AM This report has been signed electronically. Number of Addenda: 0

## 2021-10-17 NOTE — Progress Notes (Addendum)
[facility] Progress Note   Subjective: Went for endoscopy today. One non-bleeding superficial gastric ulcer with no stigmata of bleeding was found in the prepyloric region of the stomach otherwise normal exam. Being seen by Dr. Stanford Breed for possible RKA later this week.   Objective Vitals:   10/16/21 2130 10/17/21 0535 10/17/21 0918 10/17/21 1002  BP: 136/68 140/72 (!) 168/81 (!) 153/56  Pulse: 88 91 90 86  Resp: 18 18 18 14   Temp: 98 F (36.7 C) 97.9 F (36.6 C) 98.4 F (36.9 C) 97.7 F (36.5 C)  TempSrc:   Oral Temporal  SpO2: 98% 99% 100% 100%  Weight:      Height:       Physical Exam General: Chronically ill appearing male in NAD Heart: S1,S2 RRR no M/R. SR 1st degree AVB on monitor  Lungs: CTAB. 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  Dialysis Access: [facility] drsg CDI. 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 CBC: Recent Labs  Lab 10/12/21 2231 10/13/21 1606 10/14/21 0328 10/15/21 0407 10/16/21 0801 10/16/21 1842 10/17/21 0221  WBC 9.3 9.5 7.5 8.6 9.0  --  6.7  NEUTROABS 6.3  --   --   --   --   --   --   HGB 6.6* 7.4* 7.8* 7.5* 8.0* 9.3* 8.4*  HCT 20.6* 22.5* 22.7* 22.9* 23.9* 28.3* 25.1*  MCV 95.8 93.0 93.4 94.2 95.2  --  94.4  PLT 263 234 219 218 219  --  212   Blood Culture    Component Value Date/Time   SDES URINE, CLEAN CATCH 10/14/2021 1040    SPECREQUEST NONE 10/14/2021 1040   CULT  10/14/2021 1040    NO GROWTH Performed at [facility], [place] [address], [place], [place] [address]    REPTSTATUS 10/15/2021 FINAL 10/14/2021 1040    Cardiac Enzymes: No results for input(s): CKTOTAL, CKMB, CKMBINDEX, TROPONINI in the last 168 hours. CBG: Recent Labs  Lab 10/16/21 0635 10/16/21 1126 10/16/21 1746 10/16/21 2140 10/17/21 0727  GLUCAP 153* 158* 156* 114* 141*   Iron Studies: No results for input(s): IRON, TIBC, TRANSFERRIN, FERRITIN in the last 72 hours. @lablastinr3 @ Studies/Results: MR FOOT RIGHT WO CONTRAST  Result Date: 10/17/2021 CLINICAL DATA:  Limping, acute foot pain, infection suspected. Evaluate right heel ulcer history of diabetes. EXAM: MRI OF THE RIGHT FOREFOOT WITHOUT CONTRAST TECHNIQUE: Multiplanar, multisequence MR imaging of the right hindfoot was performed. No intravenous contrast was administered. COMPARISON:  Radiographs dated October 12, 2021 FINDINGS: Bones/Joint/Cartilage T1 hypointense and hyperintense signal on STIR and T2 sequences on the lateral aspect of the calcaneus which in the presence of adjacent deep skin wound is  highly suspicious for acute osteomyelitis. There is susceptibility artifact from hardware in the distal tibia. 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 No drainable fluid collection or abscess. The tendons of the flexor, extensor and peroneal compartments are intact. Mild tendinopathy of the Achilles tendon without tear. Soft tissues Skin irregularity about the plantar aspect of the calcaneus consistent with skin wound without evidence of drainable fluid collection or abscess. Mild subcutaneous soft tissue edema about the plantar soft tissues. IMPRESSION: 1. Abnormal marrow signal of the lateral aspect of the calcaneus which in the presence of adjacent deep skin wound is highly suspicious for  acute osteomyelitis. 2. 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 No drainable fluid collection or abscess. Electronically Signed   By: Keane Police D.O.   On: 10/17/2021 08:11   VAS [place] LOWER EXTREMITY VENOUS (DVT)  Result Date: 10/16/2021  Lower Venous DVT Study Patient Name:  Roger Vazquez  Date of Exam:   10/16/2021 Medical Rec #: [id number]         Accession #:    [id number] Date of Birth: 09-Jun-1951         Patient Gender: M Patient Age:   71 years Exam Location:  [facility] Procedure:      VAS [place] LOWER EXTREMITY VENOUS (DVT) Referring Phys: Jerald Kief REGALADO --------------------------------------------------------------------------------  Indications: Follow up exam.  Anticoagulation: Eliquis (before hospitalization). Comparison       Previous exam on 08/12/2021 was positive for DVT in LLE CFV & Study:           SFJ Performing Technologist: Rogelia Rohrer RVT, RDMS  Examination Guidelines: A complete evaluation includes B-mode imaging, spectral Doppler, color Doppler, and power Doppler as needed of all accessible portions of each vessel. Bilateral testing is considered an integral part of a complete examination. Limited examinations for reoccurring indications may be performed as noted. The reflux portion of the exam is performed with the patient in reverse Trendelenburg.  +---------+---------------+---------+-----------+----------+--------------+  RIGHT     Compressibility Phasicity Spontaneity Properties Thrombus Aging  +---------+---------------+---------+-----------+----------+--------------+  CFV       Full            Yes       Yes                                    +---------+---------------+---------+-----------+----------+--------------+  SFJ       Partial                                          Chronic         +---------+---------------+---------+-----------+----------+--------------+  FV Prox   Full            Yes        Yes                                    +---------+---------------+---------+-----------+----------+--------------+  FV Mid    Full            Yes       Yes                                    +---------+---------------+---------+-----------+----------+--------------+  FV Distal Full            Yes       Yes                                    +---------+---------------+---------+-----------+----------+--------------+  PFV       Full                                                             +---------+---------------+---------+-----------+----------+--------------+  POP       Full            Yes       Yes                                    +---------+---------------+---------+-----------+----------+--------------+  PTV                                                        BKA             +---------+---------------+---------+-----------+----------+--------------+  PERO                                                       BKA             +---------+---------------+---------+-----------+----------+--------------+ Small area of residual thrombus noted in the SFJ  +----+---------------+---------+-----------+----------+--------------+  LEFT Compressibility Phasicity Spontaneity Properties Thrombus Aging  +----+---------------+---------+-----------+----------+--------------+  CFV  Full            Yes       Yes                                    +----+---------------+---------+-----------+----------+--------------+     Summary: RIGHT: - No evidence of common femoral vein obstruction. - Ultrasound characteristics of enlarged lymph nodes are noted in the groin.  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 - Findings appear improved from previous examination. - There is no evidence of superficial venous thrombosis.  - No cystic structure found in the popliteal fossa. - Ultrasound characteristics of enlarged lymph nodes noted in the groin.  *See table(s) above for measurements and  observations. Electronically signed by Monica Martinez MD on 10/16/2021 at 3:01:01 PM.    Final    Medications:  [MAR Hold] sodium chloride 10 mL/hr (10/17/21 1018)    [MAR Hold] amLODipine  10 mg Oral Daily   [MAR Hold] calcium acetate  667 mg Oral TID WC   [MAR Hold] Chlorhexidine Gluconate Cloth  6 each Topical Q0600   [MAR Hold] darbepoetin (ARANESP) injection - DIALYSIS  100 mcg Intravenous Q Fri-HD   [MAR Hold] feeding supplement (NEPRO CARB STEADY)  237 mL Oral TID BM   [MAR Hold] ferrous sulfate  325  mg Oral Q breakfast   [MAR Hold] insulin aspart  0-6 Units Subcutaneous TID WC   [MAR Hold] multivitamin  1 tablet Oral QHS   [MAR Hold] pantoprazole (PROTONIX) IV  40 mg Intravenous Q12H   [MAR Hold] polyethylene glycol  17 g Oral BID   [MAR Hold] senna  1 tablet Oral Daily   [MAR Hold] sodium chloride flush  3 mL Intravenous Q12H     Dialysis Orders: Center: [place]  on MWF . 180NRe, 3 hr 15 min, BFR 400, DFR A1.5, EDW 54kg, 2K 2Ca, TDC AVG with suture intact. no heparin Mircera 293mcg IV q HD- last dose was 138mcg on 10/19 Calcium acetate 667mg  1 tab TID with meals Tums 200mg - 2 tabs PO BID   Assessment/Plan:  Symptomatic anemia: GI bleed suspected, GI is on board. Pt currently on PPI, Eliquis on hold.  Went for endoscopy today. One non-bleeding superficial gastric ulcer with no stigmata of bleeding was found in the prepyloric region of the stomach otherwise normal exam.  S/P 1 unit PRBCs 10/13/2021. HGB 8.4  today.  Unstageable Ulceration R heel with calcaneus Osteomyelitis. VVS planning R BKA later this week.   ESRD:  Dialysis on MWF schedule, next HD 09/17/2022.   Hypertension/volume: BP still elevated. Very much under EDW.  Lower EDW on DC.  UF as tolerated.   Anemia: Due to #1 above. Aranesp 100 mcg IV 10/13/2021. Follow trends.   Metabolic bone disease: Calcium on the high side, noted he takes tums and calcium acetate outpatient. Currently NPO but will resume calcium acetate  once he is eating, hold tums for now.   Nutrition:  NPO.  Malfunctioning AVG: Went to [facility] placement and shuntogram earlier in December 2022. Needs referral to [facility] for new access when he is stable.      Rita H. Brown NP-C 10/17/2021, 10:37 AM  [employer] [phone]  Seen and examined independently.  Agree with note and exam as documented above by physician extender and as noted here.  Back from EGD.  He states he spoke with vascular and is discouraged to hear that they recommend an amputation. Sister is at bedside  General adult male in bed in no acute distress HEENT normocephalic atraumatic extraocular movements intact sclera anicteric Neck supple trachea midline Lungs clear to auscultation bilaterally normal work of breathing at rest  Heart S1S2 no rub Abdomen soft nontender nondistended Extremities no edema left BKA Neuro - alert and oriented x 3 provides hx and follows commands Access left IJ tunn catheter and RUE AVF thrill and thrill.  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

## 2021-10-17 NOTE — Interval H&P Note (Signed)
History and Physical Interval Note:  10/17/2021 9:56 AM  Roger Vazquez  has presented today for surgery, with the diagnosis of Anemia.  The various methods of treatment have been discussed with the patient and family. After consideration of risks, benefits and other options for treatment, the patient has consented to  Procedure(s): COLONOSCOPY WITH PROPOFOL (N/A) ESOPHAGOGASTRODUODENOSCOPY (EGD) WITH PROPOFOL (N/A) as a surgical intervention.  The patient's history has been reviewed, patient examined, no change in status, stable for surgery.  I have reviewed the patient's chart and labs.  Questions were answered to the patient's satisfaction.     Scarlette Shorts

## 2021-10-17 NOTE — Anesthesia Postprocedure Evaluation (Signed)
Anesthesia Post Note  Patient: Roger Vazquez  Procedure(s) Performed: COLONOSCOPY WITH PROPOFOL ESOPHAGOGASTRODUODENOSCOPY (EGD) WITH PROPOFOL BIOPSY     Patient location during evaluation: PACU Anesthesia Type: MAC Level of consciousness: awake and alert Pain management: pain level controlled Vital Signs Assessment: post-procedure vital signs reviewed and stable Respiratory status: spontaneous breathing, nonlabored ventilation, respiratory function stable and patient connected to nasal cannula oxygen Cardiovascular status: stable and blood pressure returned to baseline Postop Assessment: no apparent nausea or vomiting Anesthetic complications: no   No notable events documented.  Last Vitals:  Vitals:   10/17/21 1147 10/17/21 1202  BP: (!) 155/71 (!) 157/70  Pulse: 82 81  Resp: (!) 23 19  Temp:  36.7 C  SpO2: 100% 100%    Last Pain:  Vitals:   10/17/21 1202  TempSrc:   PainSc: 0-No pain                 Effie Berkshire

## 2021-10-17 NOTE — Anesthesia Preprocedure Evaluation (Addendum)
Anesthesia Evaluation  Patient identified by MRN, date of birth, ID band Patient awake    Reviewed: Allergy & Precautions, NPO status , Patient's Chart, lab work & pertinent test results  Airway Mallampati: I  TM Distance: >3 FB Neck ROM: Full    Dental  (+) Edentulous Upper, Edentulous Lower   Pulmonary former smoker,    Pulmonary exam normal        Cardiovascular hypertension, Pt. on medications + Peripheral Vascular Disease   Rhythm:Regular Rate:Normal     Neuro/Psych  Headaches, negative psych ROS   GI/Hepatic GERD  Medicated,(+) Hepatitis -, C  Endo/Other  diabetes, Type 2, Insulin Dependent  Renal/GU Dialysis and ESRFRenal disease     Musculoskeletal negative musculoskeletal ROS (+)   Abdominal Normal abdominal exam  (+)   Peds  Hematology  (+) Blood dyscrasia, anemia ,   Anesthesia Other Findings   Reproductive/Obstetrics                            Anesthesia Physical Anesthesia Plan  ASA: 3  Anesthesia Plan: MAC   Post-op Pain Management:    Induction: Intravenous  PONV Risk Score and Plan: 0 and Propofol infusion  Airway Management Planned: Natural Airway  Additional Equipment: None  Intra-op Plan:   Post-operative Plan:   Informed Consent: I have reviewed the patients History and Physical, chart, labs and discussed the procedure including the risks, benefits and alternatives for the proposed anesthesia with the patient or authorized representative who has indicated his/her understanding and acceptance.       Plan Discussed with: CRNA  Anesthesia Plan Comments:       Anesthesia Quick Evaluation

## 2021-10-18 DIAGNOSIS — M86171 Other acute osteomyelitis, right ankle and foot: Secondary | ICD-10-CM | POA: Diagnosis not present

## 2021-10-18 DIAGNOSIS — N186 End stage renal disease: Secondary | ICD-10-CM | POA: Diagnosis not present

## 2021-10-18 DIAGNOSIS — Z992 Dependence on renal dialysis: Secondary | ICD-10-CM | POA: Diagnosis not present

## 2021-10-18 DIAGNOSIS — D649 Anemia, unspecified: Secondary | ICD-10-CM | POA: Diagnosis not present

## 2021-10-18 LAB — BASIC METABOLIC PANEL
Anion gap: 14 (ref 5–15)
BUN: 28 mg/dL — ABNORMAL HIGH (ref 8–23)
CO2: 23 mmol/L (ref 22–32)
Calcium: 9.3 mg/dL (ref 8.9–10.3)
Chloride: 99 mmol/L (ref 98–111)
Creatinine, Ser: 6.4 mg/dL — ABNORMAL HIGH (ref 0.61–1.24)
GFR, Estimated: 9 mL/min — ABNORMAL LOW (ref >60)
Glucose, Bld: 157 mg/dL — ABNORMAL HIGH (ref 70–99)
Potassium: 4.4 mmol/L (ref 3.5–5.1)
Sodium: 136 mmol/L (ref 135–145)

## 2021-10-18 LAB — APTT
aPTT: 52 seconds — ABNORMAL HIGH (ref 24–36)
aPTT: 64 seconds — ABNORMAL HIGH (ref 24–36)
aPTT: 73 seconds — ABNORMAL HIGH (ref 24–36)

## 2021-10-18 LAB — CBC
HCT: 22.8 % — ABNORMAL LOW (ref 39.0–52.0)
Hemoglobin: 7.6 g/dL — ABNORMAL LOW (ref 13.0–17.0)
MCH: 31.4 pg (ref 26.0–34.0)
MCHC: 33.3 g/dL (ref 30.0–36.0)
MCV: 94.2 fL (ref 80.0–100.0)
Platelets: 220 10*3/uL (ref 150–400)
RBC: 2.42 MIL/uL — ABNORMAL LOW (ref 4.22–5.81)
RDW: 19 % — ABNORMAL HIGH (ref 11.5–15.5)
WBC: 6.7 10*3/uL (ref 4.0–10.5)
nRBC: 0 % (ref 0.0–0.2)

## 2021-10-18 LAB — HEPARIN LEVEL (UNFRACTIONATED)
Heparin Unfractionated: 0.24 IU/mL — ABNORMAL LOW (ref 0.30–0.70)
Heparin Unfractionated: 0.25 IU/mL — ABNORMAL LOW (ref 0.30–0.70)

## 2021-10-18 LAB — GLUCOSE, CAPILLARY
Glucose-Capillary: 135 mg/dL — ABNORMAL HIGH (ref 70–99)
Glucose-Capillary: 151 mg/dL — ABNORMAL HIGH (ref 70–99)
Glucose-Capillary: 132 mg/dL — ABNORMAL HIGH (ref 70–99)
Glucose-Capillary: 175 mg/dL — ABNORMAL HIGH (ref 70–99)

## 2021-10-18 MED ORDER — HYDRALAZINE HCL 25 MG PO TABS: 25.0000 mg | ORAL_TABLET | ORAL | Status: DC | PRN

## 2021-10-18 MED ORDER — HEPARIN SODIUM (PORCINE) 1000 UNIT/ML IJ SOLN
INTRAMUSCULAR | Status: AC
  Filled 2021-10-18: qty 4

## 2021-10-18 NOTE — Hospital Course (Addendum)
Roger Vazquez is a 71 yo male with PMH ESRD on HD MWF, PAD s/p LBKA, DMII, HTN, anemia of chronic disease who presented with a hemoglobin of 6.4 g/dL from his SNF He was also reported to be feeling weak and tired. He underwent evaluation with GI regarding his anemia with EGD and colonoscopy.  EGD showed small gastric ulcer and he was continued on a PPI.  Colonoscopy showed diverticulosis.  He also had an ongoing wound to his right heel.  He underwent further evaluation with MRI of the right foot which showed osteomyelitis involving the calcaneus.  He was evaluated by vascular surgery recommendations for a right BKA which was performed on 10/20/2021.

## 2021-10-18 NOTE — Assessment & Plan Note (Addendum)
-  Patient with known heel ulcer, has been followed by vascular -He had failed ABIs recently with inadequate perfusion to promote wound healing - evaluated by vascular surgery with recommendations for RBKA on 10/20/21 - he is wheelchair bound; already had LBKA and does not use a prosthesis -post op PT/OT evals ordered - more depressed today, 1/9 likely from amputation; will ask for chaplain to see patient if able  - VAC dressing in place; inform Dr. Stanford Breed prior to discharge for removal; outpt followup for staple removal (2-3 weeks)

## 2021-10-18 NOTE — TOC Progression Note (Signed)
Transition of Care [facility]) - Initial/Assessment Note    Patient Details  Name: Roger Vazquez MRN: [id number] Date of Birth: [birth year]  Transition of Care [facility]) CM/SW Contact:    Milinda Antis, [place] Phone Number: 10/18/2021, 12:15 PM  Clinical Narrative:                 CSW left a secure VM for Janie at [facility] where the patient is receiving LTC informing the facility that the patient is not medically ready today and asking if CSW can request a bed hold for the patient.    CSW is awaiting a returned call.          Patient Goals and CMS Choice        Expected Discharge Plan and Services                                                Prior Living Arrangements/Services                       Activities of Daily Living Home Assistive Devices/Equipment: Wheelchair ADL Screening (condition at time of admission) Patient's cognitive ability adequate to safely complete daily activities?: No Is the patient deaf or have difficulty hearing?: No Does the patient have difficulty seeing, even when wearing glasses/contacts?: No Does the patient have difficulty concentrating, remembering, or making decisions?: No Patient able to express need for assistance with ADLs?: Yes Does the patient have difficulty dressing or bathing?: Yes Independently performs ADLs?: No Does the patient have difficulty walking or climbing stairs?: Yes Weakness of Legs: Both Weakness of Arms/Hands: Both  Permission Sought/Granted                  Emotional Assessment              Admission diagnosis:  Symptomatic anemia [D64.9] Patient Active Problem List   Diagnosis Date Noted   Diverticulosis of colon without hemorrhage    Acute gastric ulcer without hemorrhage or perforation    Acute osteomyelitis of right calcaneus ([place])    Chronic anticoagulation    Heel ulcer, right, with unspecified severity ([place]) 10/13/2021   DVT (deep venous thrombosis) ([place]) 10/13/2021    Lymphadenopathy 10/13/2021   COVID-19 54/00/8676   Acute metabolic encephalopathy 19/50/9326   COVID-19 virus infection 08/12/2021   Peripheral vascular disease ([place]) 06/09/2021   S/P BKA (below knee amputation), left ([place]) 06/08/2021   Status post amputation of foot through metatarsal bone ([place])    Altered mental status 05/19/2021   Left foot infection    Alcohol abuse 05/16/2021   Erectile dysfunction 05/16/2021   Hydronephrosis 05/16/2021   Acute hepatitis C 05/16/2021   Diabetic retinopathy ([place]) 05/16/2021   History of hemodialysis 05/16/2021   Iron deficiency anemia 05/16/2021   Malignant neoplasm of upper lobe, left bronchus or lung ([place]) 05/16/2021   Noncompliance with medication regimen 05/16/2021   Non-small cell lung cancer ([place]) 05/16/2021   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   Vitreous hemorrhage, left eye ([place]) 05/16/2021   Cellulitis of left lower extremity    Gangrene ([place]) 04/27/2021   Diarrhea, unspecified 04/29/2019   Mild protein-calorie malnutrition ([place]) 08/20/2018   Fever    Infection of AV graft for dialysis ([place]) 08/05/2018   Symptomatic anemia 08/05/2018   Hypokalemia 08/05/2018   GERD (gastroesophageal reflux disease)  08/05/2018   Sepsis due to unspecified Staphylococcus ([place]) 08/02/2018   ESRD on hemodialysis ([place])    Bacteremia due to methicillin susceptible Staphylococcus aureus (MSSA) 07/28/2018   Sepsis ([place]) 07/27/2018   Headache 01/14/2017   Fluid overload, unspecified 12/13/2015   Anemia in chronic kidney disease 12/09/2015   Chronic kidney disease, stage 5 ([place]) 12/09/2015   Coagulation defect, unspecified ([place]) 12/09/2015   Encounter for adjustment and management of vascular access device 12/09/2015   Other secondary pulmonary hypertension ([place]) 12/09/2015   Pruritus, unspecified 12/09/2015   Secondary hyperparathyroidism of renal origin ([place]) 12/09/2015   Shortness of breath 12/09/2015   Abdominal pain, other specified site  10/07/2011   Nausea and vomiting 10/07/2011   VITAMIN D DEFICIENCY 03/19/2008   FRACTURE, TIBIA 02/09/2008   HTN (hypertension) 11/25/2007   HEPATITIS C 05/27/2007   Type 2 diabetes mellitus with chronic kidney disease on chronic dialysis ([place]) 05/27/2007   ERECTILE DYSFUNCTION 05/27/2007   ABUSE, ALCOHOL, IN REMISSION 05/27/2007   ABUSE, OPIOID, IN REMISSION 05/27/2007   ABUSE, COCAINE, IN REMISSION 05/27/2007   PCP:  Clinic, [place]:   [facility] [place], [place] [address] [place] [address] Phone: [phone] Fax: [phone]     Social Determinants of Health (SDOH) Interventions    Readmission Risk Interventions Readmission Risk Prevention Plan 05/30/2021  Transportation Screening Complete  SW Recovery Care/Counseling Consult Complete  Skilled Nursing Facility Complete  Some recent data might be hidden

## 2021-10-18 NOTE — Progress Notes (Addendum)
[facility] Progress Note   Subjective: Seen prior to HD. No C/Os. Going for R BKA later this week.   Objective Vitals:   10/17/21 1636 10/17/21 2115 10/18/21 0438 10/18/21 0833  BP: 140/67 136/65 (!) 142/64 (!) 145/65  Pulse: 84 85 80   Resp: 17 18  14   Temp: 97.7 F (36.5 C) 98 F (36.7 C) 98.6 F (37 C) 99.3 F (37.4 C)  TempSrc: Oral  Oral   SpO2: 100% 100% 100% 100%  Weight:    52.7 kg  Height:       Physical Exam General: Chronically ill appearing male in NAD Heart: S1,S2 RRR no M/R. SR 1st degree AVB on monitor  Lungs: CTAB. 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  Dialysis Access: [facility] drsg CDI. Has R AVG + T/B which is unusable   Additional Objective Labs: Basic Metabolic Panel: Recent Labs  Lab 10/14/21 0328 10/17/21 0221 10/18/21 0320  NA 137 135 136  K 3.9 4.2 4.4  CL 98 100 99  CO2 29 22 23   GLUCOSE 111* 154* 157*  BUN 17 22 28*  CREATININE 4.71* 4.71* 6.40*  CALCIUM 8.9 8.9 9.3  PHOS  --  1.6*  --    Liver Function Tests: Recent Labs  Lab 10/12/21 2231 10/17/21 0221  AST 24  --   ALT 15  --   ALKPHOS 79  --   BILITOT 0.5  --   PROT 8.6*  --   ALBUMIN 3.3* 3.0*   No results for input(s): LIPASE, AMYLASE in the last 168 hours. CBC: Recent Labs  Lab 10/12/21 2231 10/13/21 1606 10/14/21 0328 10/15/21 0407 10/16/21 0801 10/16/21 1842 10/17/21 0221 10/18/21 0320  WBC 9.3   < > 7.5 8.6 9.0  --  6.7 6.7  NEUTROABS 6.3  --   --   --   --   --   --   --   HGB 6.6*   < > 7.8* 7.5* 8.0* 9.3* 8.4* 7.6*  HCT 20.6*   < > 22.7* 22.9* 23.9* 28.3* 25.1* 22.8*  MCV 95.8   < > 93.4 94.2 95.2  --  94.4 94.2  PLT 263   < > 219 218 219  --  212 220   < > = values in this interval not displayed.   Blood Culture    Component Value Date/Time   SDES URINE, CLEAN CATCH 10/14/2021 1040   [place] 10/14/2021 1040   CULT  10/14/2021 1040    NO GROWTH Performed  at [facility], [address] [address] [place], [place] [address]    REPTSTATUS 10/15/2021 FINAL 10/14/2021 1040    Cardiac Enzymes: No results for input(s): CKTOTAL, CKMB, CKMBINDEX, TROPONINI in the last 168 hours. CBG: Recent Labs  Lab 10/17/21 0727 10/17/21 1117 10/17/21 1635 10/17/21 2114 10/18/21 0645  GLUCAP 141* 135* 186* 80 135*   Iron Studies: No results for input(s): IRON, TIBC, TRANSFERRIN, FERRITIN in the last 72 hours. @lablastinr3 @ Studies/Results: MR FOOT RIGHT WO CONTRAST  Result Date: 10/17/2021 CLINICAL DATA:  Limping, acute foot pain, infection suspected. Evaluate right heel ulcer history of diabetes. EXAM: MRI OF THE RIGHT FOREFOOT WITHOUT CONTRAST TECHNIQUE: Multiplanar, multisequence MR imaging of the right hindfoot was performed. No intravenous contrast was administered. COMPARISON:  Radiographs dated October 12, 2021 FINDINGS: Bones/Joint/Cartilage T1 hypointense and hyperintense signal on STIR and T2 sequences on the lateral aspect of the  calcaneus which in the presence of adjacent deep skin wound is highly suspicious for acute osteomyelitis. There is susceptibility artifact from hardware in the distal tibia. 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 No drainable fluid collection or abscess. The tendons of the flexor, extensor and peroneal compartments are intact. Mild tendinopathy of the Achilles tendon without tear. Soft tissues Skin irregularity about the plantar aspect of the calcaneus consistent with skin wound without evidence of drainable fluid collection or abscess. Mild subcutaneous soft tissue edema about the plantar soft tissues. IMPRESSION: 1. Abnormal marrow signal of the lateral aspect of the calcaneus which in the presence of adjacent deep skin wound is highly suspicious for acute osteomyelitis. 2. 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 No drainable fluid collection or abscess. Electronically Signed   By: Keane Police D.O.   On: 10/17/2021 08:11   VAS [place] LOWER EXTREMITY VENOUS (DVT)  Result Date: 10/16/2021  Lower Venous DVT Study Patient Name:  Roger Vazquez  Date of Exam:   10/16/2021 Medical Rec #: [id number]         Accession #:    [id number] Date of Birth: August 09, 1951         Patient Gender: M Patient Age:   71 years Exam Location:  [facility] Procedure:      VAS [place] LOWER EXTREMITY VENOUS (DVT) Referring Phys: Jerald Kief REGALADO --------------------------------------------------------------------------------  Indications: Follow up exam.  Anticoagulation: Eliquis (before hospitalization). Comparison       Previous exam on 08/12/2021 was positive for DVT in LLE CFV & Study:           SFJ Performing Technologist: Rogelia Rohrer RVT, RDMS  Examination Guidelines: A complete evaluation includes B-mode imaging, spectral Doppler, color Doppler, and power Doppler as needed of all accessible portions of each vessel. Bilateral testing is considered an integral part of a complete examination. Limited examinations for reoccurring indications may be performed as noted. The reflux portion of the exam is performed with the patient in reverse Trendelenburg.  +---------+---------------+---------+-----------+----------+--------------+  RIGHT     Compressibility Phasicity Spontaneity Properties Thrombus Aging  +---------+---------------+---------+-----------+----------+--------------+  CFV       Full            Yes       Yes                                    +---------+---------------+---------+-----------+----------+--------------+  SFJ       Partial                                          Chronic         +---------+---------------+---------+-----------+----------+--------------+  FV Prox   Full            Yes       Yes                                     +---------+---------------+---------+-----------+----------+--------------+  FV Mid    Full            Yes       Yes                                    +---------+---------------+---------+-----------+----------+--------------+  FV Distal Full            Yes       Yes                                    +---------+---------------+---------+-----------+----------+--------------+  PFV       Full                                                             +---------+---------------+---------+-----------+----------+--------------+  POP       Full            Yes       Yes                                    +---------+---------------+---------+-----------+----------+--------------+  PTV                                                        BKA             +---------+---------------+---------+-----------+----------+--------------+  PERO                                                       BKA             +---------+---------------+---------+-----------+----------+--------------+ Small area of residual thrombus noted in the SFJ  +----+---------------+---------+-----------+----------+--------------+  LEFT Compressibility Phasicity Spontaneity Properties Thrombus Aging  +----+---------------+---------+-----------+----------+--------------+  CFV  Full            Yes       Yes                                    +----+---------------+---------+-----------+----------+--------------+     Summary: RIGHT: - No evidence of common femoral vein obstruction. - Ultrasound characteristics of enlarged lymph nodes are noted in the groin.  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 - Findings appear improved from previous examination. - There is no evidence of superficial venous thrombosis.  - No cystic structure found in the popliteal fossa. - Ultrasound characteristics of enlarged lymph nodes noted in the groin.  *See table(s) above for measurements and observations. Electronically signed by  Monica Martinez MD on 10/16/2021 at 3:01:01 PM.    Final    Medications:  sodium chloride 10 mL/hr (10/17/21 1018)   heparin 900 Units/hr (10/18/21 0115)    amLODipine  10 mg Oral Daily   calcium acetate  667 mg Oral TID WC   Chlorhexidine Gluconate Cloth  6 each Topical Q0600   [START ON 10/20/2021] darbepoetin (ARANESP) injection - DIALYSIS  100 mcg Intravenous Q Fri-HD   feeding supplement (NEPRO CARB STEADY)  237 mL Oral TID BM   ferrous sulfate  325 mg Oral Q breakfast  insulin aspart  0-6 Units Subcutaneous TID WC   multivitamin  1 tablet Oral QHS   pantoprazole (PROTONIX) IV  40 mg Intravenous Q12H   polyethylene glycol  17 g Oral BID   senna  1 tablet Oral Daily   sodium chloride flush  3 mL Intravenous Q12H     Dialysis Orders: Center: [place]  on MWF . 180NRe, 3 hr 15 min, BFR 400, DFR A1.5, EDW 54kg, 2K 2Ca, TDC AVG with suture intact. no heparin Mircera 226mcg IV q HD- last dose was 154mcg on 10/19 Calcium acetate 667mg  1 tab TID with meals Tums 200mg - 2 tabs PO BID   Assessment/Plan:  Symptomatic anemia: GI bleed suspected, GI is on board. Pt currently on PPI, Eliquis on hold.  Went for endoscopy today. One non-bleeding superficial gastric ulcer with no stigmata of bleeding was found in the prepyloric region of the stomach otherwise normal exam.  S/P 1 unit PRBCs 10/13/2021. HGB 7.6 today.  Unstageable Ulceration R heel with calcaneus Osteomyelitis. Seen by ID. No ABX. VVS planning R BKA later this week.   ESRD:  Dialysis on MWF schedule, next HD 09/19/2022.   Hypertension/volume: BP better controlled today. Very much under EDW.  Lower EDW on DC.  UF as tolerated.   Anemia: Due to #1 above. Aranesp 100 mcg IV 10/13/2021.  Will increase dose. Follow trends.   Metabolic bone disease: Calcium on the high side, noted he takes tums and calcium acetate outpatient. Currently NPO but will resume calcium acetate once he is eating, hold tums for now.   Nutrition:  Renal diet. Low  albumin in setting of Osteo. Will order protein supps.   Malfunctioning AVG: Went to [facility] placement and shuntogram earlier in December 2022. Needs referral to [facility] for new access when he is stable.     Rita H. [place] NP-C 10/18/2021, 9:04 AM  [employer] [phone]   Seen and examined independently.  Agree with note and exam as documented above by physician extender and as noted here.  Seen and examined on dialysis.  Procedure supervised.  Blood pressure 124/59 and HR 82.  Tolerating goal.    Claudia Desanctis, MD 10/18/2021  9:39 AM    Claudia Desanctis, MD 10/18/2021  9:38 AM

## 2021-10-18 NOTE — Progress Notes (Signed)
Pt off unit to hemodialysis. 

## 2021-10-18 NOTE — Progress Notes (Signed)
ANTICOAGULATION CONSULT NOTE - Initial Consult  Pharmacy Consult for heparin  Indication: DVT  Allergies  Allergen Reactions   Sildenafil Other (See Comments)    Pt stated, "Makes my Blood Pressure drop"    Patient Measurements: Height: 5\' 6"  (167.6 cm) Weight: 52.7 kg (116 lb 2.9 oz) IBW/kg (Calculated) : 63.8   Vital Signs: Temp: 99.3 F (37.4 C) (01/04 0904) Temp Source: Oral (01/04 0438) BP: 114/73 (01/04 1130) Pulse Rate: 83 (01/04 1130)  Labs: Recent Labs    10/16/21 0801 10/16/21 1738 10/16/21 1842 10/17/21 0221 10/17/21 2131 10/18/21 0320 10/18/21 1227  HGB 8.0*  --  9.3* 8.4*  --  7.6*  --   HCT 23.9*  --  28.3* 25.1*  --  22.8*  --   PLT 219  --   --  212  --  220  --   APTT  --   --   --   --  48* 52* 64*  HEPARINUNFRC  --  0.88*  --   --   --  0.24* 0.25*  CREATININE  --   --   --  4.71*  --  6.40*  --      Estimated Creatinine Clearance: 7.9 mL/min (A) (by C-G formula based on SCr of 6.4 mg/dL (H)).   Medical History: Past Medical History:  Diagnosis Date   Anemia    Blood dyscrasia    history of coagulation deficit per [facility] LPN nursing home   Cancer [facility])    Lung   Diabetes mellitus    Hepatitis    Hypertension    Renal disorder    MWF- [facility]    Medications:  Medications Prior to Admission  Medication Sig Dispense Refill Last Dose   acetaminophen (TYLENOL) 325 MG tablet Take 650 mg by mouth every 6 (six) hours.   Past Week   amLODipine (NORVASC) 10 MG tablet Take 1 tablet (10 mg total) by mouth daily.   10/12/2021   apixaban (ELIQUIS) 5 MG TABS tablet Take 1 tablet (5 mg total) by mouth 2 (two) times daily. 60 tablet  10/12/2021 at 9am   aspirin 81 MG chewable tablet Chew 81 mg by mouth daily. (0800)   10/12/2021   calcium acetate (PHOSLO) 667 MG capsule Take 667 mg by mouth 3 (three) times daily with meals. (0800, 1300 & 1700)   10/12/2021   ferrous sulfate 325 (65 FE) MG tablet Take 325 mg by mouth daily with  breakfast.   10/13/2021   hydrocortisone (ANUSOL-HC) 25 MG suppository Place 25 mg rectally 2 (two) times daily as needed for hemorrhoids or anal itching.   unk   lidocaine-prilocaine (EMLA) cream Apply 1 application topically See admin instructions. As needed on dialysis days.Laurine Blazer and Friday)   Past Week   Multiple Vitamin (MULITIVITAMIN WITH MINERALS) TABS Take 1 tablet by mouth daily. 30 tablet 0 10/12/2021   Nutritional Supplements (NOVASOURCE RENAL PO) Take 237 mLs by mouth daily.   Past Week   oxyCODONE (OXY IR/ROXICODONE) 5 MG immediate release tablet Take 5 mg by mouth 2 (two) times daily as needed for severe pain.   10/12/2021   polyethylene glycol (MIRALAX / GLYCOLAX) 17 g packet Take 17 g by mouth 2 (two) times daily. 14 each 0 Past Week   senna (SENOKOT) 8.6 MG TABS tablet Take 1 tablet (8.6 mg total) by mouth daily. 7 tablet 0 10/12/2021   apixaban (ELIQUIS) 5 MG TABS tablet Take 2 tablets (10 mg total) by mouth  2 (two) times daily for 7 doses. (Patient not taking: Reported on 10/13/2021) 60 tablet  Not Taking   Scheduled:   amLODipine  10 mg Oral Daily   calcium acetate  667 mg Oral TID WC   Chlorhexidine Gluconate Cloth  6 each Topical Q0600   [START ON 10/20/2021] darbepoetin (ARANESP) injection - DIALYSIS  100 mcg Intravenous Q Fri-HD   feeding supplement (NEPRO CARB STEADY)  237 mL Oral TID BM   ferrous sulfate  325 mg Oral Q breakfast   heparin sodium (porcine)       insulin aspart  0-6 Units Subcutaneous TID WC   multivitamin  1 tablet Oral QHS   pantoprazole (PROTONIX) IV  40 mg Intravenous Q12H   polyethylene glycol  17 g Oral BID   senna  1 tablet Oral Daily   sodium chloride flush  3 mL Intravenous Q12H    Assessment: 71 yo male with recent DVT (07/2021) on apixaban PTA (last dose taken is unclear). He is noted with symptomatic anemia with plans for endoscopy/colonoscopy on 1/3. Pharmacy consulted to dose heparin. He is noted with ESRD  Pt is s/p EGD  and colonoscopy. Heparin is slightly subtherapeutic today. Almost correlating. We will increase rate and check another PTT. Plan for BKA this week.   Goal of Therapy:  Heparin level= 0.3-0.7 aPTT 66-102 Monitor platelets by anticoagulation protocol: Yes   Plan:  Increase heparin 1000 units/hr Check 8 hr PTT Daily HL/PTT/CBC F/u resume apixaban after [place], PharmD, BCIDP, AAHIVP, CPP Infectious Disease Pharmacist 10/18/2021 1:06 PM

## 2021-10-18 NOTE — Progress Notes (Signed)
Progress Note    Roger Vazquez   ZOX:[id number]  DOB: [birth year]  DOA: 10/12/2021     5 PCP: [facility], [facility]  Initial CC: abnormal labs  Hospital Course: Roger Vazquez is a 71 yo male with PMH ESRD on HD MWF, PAD s/p LBKA, DMII, HTN, anemia of chronic disease who presented with a hemoglobin of 6.4 g/dL from his SNF He was also reported to be feeling weak and tired. He underwent evaluation with GI regarding his anemia with EGD and colonoscopy.  EGD showed small gastric ulcer and he was continued on a PPI.  Colonoscopy showed diverticulosis.  He also had an ongoing wound to his right heel.  He underwent further evaluation with MRI of the right foot which showed osteomyelitis involving the calcaneus.  He was evaluated by vascular surgery recommendations for a right BKA.  Interval History:  No events overnight.  Resting in bed comfortably this morning.  He understands plan is for right BKA at some point this week.  Assessment & Plan: * Symptomatic anemia- (present on admission) -Patient with Hgb 12.6 on 11/2 and 6.6 g/dL on admission - s/p EGD (small gastric ulcer) and CLN (diverticulosis) - indefinite PPI daily for gastric ulcer. No repeat CLN needed for surveillance/screening purposes   Acute osteomyelitis of right calcaneus ([place]) -Patient with known heel ulcer, has been followed by vascular -He had failed ABIs recently with inadequate perfusion to promote wound healing - evaluated by vascular surgery with recommendations for RBKA this week - continue holding abx; ID recommends peri-op abx per routine - he is wheelchair bound; already had LBKA and does not use a prosthesis  ESRD on hemodialysis ([place]) -Patient on chronic MWF HD - continue HD inpt per nephrology -Continue Phoslo  Acute gastric ulcer without hemorrhage or perforation - Continue daily PPI indefinitely  DVT (deep venous thrombosis) ([place]) -Patient with reported prior h/o DVT -Has been on Eliquis -Hold  Eliquis for now - currently on heparin drip   Lymphadenopathy -He is due for lymph node biopsy in the axillary region on 1/3 at the [place]  HTN (hypertension)- (present on admission) -Continue Norvasc -Will cover with prn IV hydralazine  Type 2 diabetes mellitus with chronic kidney disease on chronic dialysis ([place]) -Prior A1c was 7.9% on 04/27/21 - repeat A1c now - continue SSI and CBG monitoring    Old records reviewed in assessment of this patient  Antimicrobials: Rocephin 10/14/21 x 1  DVT prophylaxis: HSQ  Code Status:   Code Status: Full Code  Disposition Plan:  [facility] if able to return  Status is: Inpt  Objective: Blood pressure 134/63, pulse 89, temperature 98.6 F (37 C), temperature source Oral, resp. rate 19, height 5\' 6"  (1.676 m), weight 51.4 kg, SpO2 100 %.  Examination:  Physical Exam Constitutional:      General: He is not in acute distress.    Appearance: Normal appearance.  HENT:     Head: Normocephalic and atraumatic.     Mouth/Throat:     Mouth: Mucous membranes are moist.  Eyes:     Extraocular Movements: Extraocular movements intact.  Cardiovascular:     Rate and Rhythm: Normal rate and regular rhythm.     Heart sounds: Normal heart sounds.  Pulmonary:     Effort: Pulmonary effort is normal. No respiratory distress.     Breath sounds: Normal breath sounds. No wheezing.  Abdominal:     General: Bowel sounds are normal. There is no distension.     Palpations:  Abdomen is soft.     Tenderness: There is no abdominal tenderness.  Musculoskeletal:     Cervical back: Normal range of motion and neck supple.     Comments: L BKA noted. Right foot noted with multiple onychomycotic toe nails with TTP throughout foot  Skin:    General: Skin is warm and dry.  Neurological:     General: No focal deficit present.     Mental Status: He is alert.  Psychiatric:        Mood and Affect: Mood normal.        Behavior: Behavior normal.     Consultants:   Vascular surgery ID  Procedures:    Data Reviewed: I have personally reviewed labs and imaging studies    LOS: 5 days  Time spent: Greater than 50% of the 35 minute visit was spent in counseling/coordination of care for the patient as laid out in the A&P.   Dwyane Dee, MD [facility] 10/18/2021, 3:27 PM

## 2021-10-18 NOTE — Progress Notes (Signed)
PT Cancellation Note  Patient Details Name: Roger Vazquez MRN: [id number] DOB: December 04, 1950   Cancelled Treatment:    Reason Eval/Treat Not Completed: Patient at procedure or test/unavailable (HD)  Wyona Almas, PT, DPT Acute Rehabilitation Services Pager [phone] Office [phone]    Deno Etienne 10/18/2021, 10:16 AM

## 2021-10-18 NOTE — Assessment & Plan Note (Signed)
-   Continue daily PPI indefinitely

## 2021-10-18 NOTE — Progress Notes (Signed)
OT Cancellation Note  Patient Details Name: EIN RIJO MRN: [id number] DOB: Mar 13, 1951   Cancelled Treatment:    Reason Eval/Treat Not Completed: Patient at procedure or test/ unavailable- pt off unit at HD. Will follow and see as able.   Jolaine Artist, OT Acute Rehabilitation Services Pager [phone] Office [phone]   Delight Stare 10/18/2021, 8:39 AM

## 2021-10-19 DIAGNOSIS — D649 Anemia, unspecified: Secondary | ICD-10-CM | POA: Diagnosis not present

## 2021-10-19 DIAGNOSIS — N186 End stage renal disease: Secondary | ICD-10-CM | POA: Diagnosis not present

## 2021-10-19 DIAGNOSIS — M86171 Other acute osteomyelitis, right ankle and foot: Secondary | ICD-10-CM | POA: Diagnosis not present

## 2021-10-19 DIAGNOSIS — Z992 Dependence on renal dialysis: Secondary | ICD-10-CM | POA: Diagnosis not present

## 2021-10-19 LAB — CBC WITH DIFFERENTIAL/PLATELET
Abs Immature Granulocytes: 0.04 10*3/uL (ref 0.00–0.07)
Basophils Absolute: 0 10*3/uL (ref 0.0–0.1)
Basophils Relative: 1 %
Eosinophils Absolute: 0.3 10*3/uL (ref 0.0–0.5)
Eosinophils Relative: 5 %
HCT: 23.4 % — ABNORMAL LOW (ref 39.0–52.0)
Hemoglobin: 7.5 g/dL — ABNORMAL LOW (ref 13.0–17.0)
Immature Granulocytes: 1 %
Lymphocytes Relative: 22 %
Lymphs Abs: 1.5 10*3/uL (ref 0.7–4.0)
MCH: 30.5 pg (ref 26.0–34.0)
MCHC: 32.1 g/dL (ref 30.0–36.0)
MCV: 95.1 fL (ref 80.0–100.0)
Monocytes Absolute: 0.8 10*3/uL (ref 0.1–1.0)
Monocytes Relative: 12 %
Neutro Abs: 3.9 10*3/uL (ref 1.7–7.7)
Neutrophils Relative %: 59 %
Platelets: 213 10*3/uL (ref 150–400)
RBC: 2.46 MIL/uL — ABNORMAL LOW (ref 4.22–5.81)
RDW: 19.5 % — ABNORMAL HIGH (ref 11.5–15.5)
WBC: 6.5 10*3/uL (ref 4.0–10.5)
nRBC: 0 % (ref 0.0–0.2)

## 2021-10-19 LAB — GLUCOSE, CAPILLARY
Glucose-Capillary: 127 mg/dL — ABNORMAL HIGH (ref 70–99)
Glucose-Capillary: 139 mg/dL — ABNORMAL HIGH (ref 70–99)
Glucose-Capillary: 146 mg/dL — ABNORMAL HIGH (ref 70–99)
Glucose-Capillary: 159 mg/dL — ABNORMAL HIGH (ref 70–99)

## 2021-10-19 LAB — HEPARIN LEVEL (UNFRACTIONATED)
Heparin Unfractionated: 0.17 IU/mL — ABNORMAL LOW (ref 0.30–0.70)
Heparin Unfractionated: 0.37 IU/mL (ref 0.30–0.70)

## 2021-10-19 LAB — BASIC METABOLIC PANEL
Anion gap: 10 (ref 5–15)
BUN: 13 mg/dL (ref 8–23)
CO2: 26 mmol/L (ref 22–32)
Calcium: 8.8 mg/dL — ABNORMAL LOW (ref 8.9–10.3)
Chloride: 99 mmol/L (ref 98–111)
Creatinine, Ser: 4.45 mg/dL — ABNORMAL HIGH (ref 0.61–1.24)
GFR, Estimated: 13 mL/min — ABNORMAL LOW (ref >60)
Glucose, Bld: 139 mg/dL — ABNORMAL HIGH (ref 70–99)
Potassium: 3.6 mmol/L (ref 3.5–5.1)
Sodium: 135 mmol/L (ref 135–145)

## 2021-10-19 LAB — HEMOGLOBIN A1C
Hgb A1c MFr Bld: 6.6 % — ABNORMAL HIGH (ref 4.8–5.6)
Mean Plasma Glucose: 143 mg/dL

## 2021-10-19 LAB — SURGICAL PATHOLOGY

## 2021-10-19 LAB — MAGNESIUM: Magnesium: 2.4 mg/dL (ref 1.7–2.4)

## 2021-10-19 MED ORDER — DARBEPOETIN ALFA 200 MCG/0.4ML IJ SOSY
200.0000 ug | PREFILLED_SYRINGE | INTRAMUSCULAR | Status: DC
  Administered 2021-10-20: 200 ug via INTRAVENOUS
  Filled 2021-10-19: qty 0.4

## 2021-10-19 MED ORDER — PANTOPRAZOLE SODIUM 40 MG PO TBEC
40.0000 mg | DELAYED_RELEASE_TABLET | Freq: Two times a day (BID) | ORAL | Status: DC
  Administered 2021-10-19 – 2021-10-24 (×9): 40 mg via ORAL
  Filled 2021-10-19 (×10): qty 1

## 2021-10-19 NOTE — Progress Notes (Addendum)
°  Progress Note    10/19/2021 7:36 AM 2 Days Post-Op  Subjective:  No complaints   Vitals:   10/18/21 2128 10/19/21 0526  BP: 139/63 140/65  Pulse: 85 81  Resp: 17 16  Temp: 99 F (37.2 C) 97.9 F (36.6 C)  SpO2: 98% 100%   Physical Exam: Lungs:  non labored Extremities:  R heel ulcer malodorous Neurologic: A&O  CBC    Component Value Date/Time   WBC 6.7 10/18/2021 0320   RBC 2.42 (L) 10/18/2021 0320   HGB 7.6 (L) 10/18/2021 0320   HCT 22.8 (L) 10/18/2021 0320   PLT 220 10/18/2021 0320   MCV 94.2 10/18/2021 0320   MCH 31.4 10/18/2021 0320   MCHC 33.3 10/18/2021 0320   RDW 19.0 (H) 10/18/2021 0320   LYMPHSABS 1.7 10/12/2021 2231   MONOABS 1.0 10/12/2021 2231   EOSABS 0.3 10/12/2021 2231   BASOSABS 0.0 10/12/2021 2231    BMET    Component Value Date/Time   NA 136 10/18/2021 0320   K 4.4 10/18/2021 0320   CL 99 10/18/2021 0320   CO2 23 10/18/2021 0320   GLUCOSE 157 (H) 10/18/2021 0320   BUN 28 (H) 10/18/2021 0320   CREATININE 6.40 (H) 10/18/2021 0320   CALCIUM 9.3 10/18/2021 0320   CALCIUM 7.1 (L) 08/01/2018 0944   GFRNONAA 9 (L) 10/18/2021 0320   GFRAA 5 (L) 08/11/2018 0858    INR    Component Value Date/Time   INR 1.1 10/13/2021 1300     Intake/Output Summary (Last 24 hours) at 10/19/2021 0736 Last data filed at 10/19/2021 0600 Gross per 24 hour  Intake 929.13 ml  Output 1450 ml  Net -520.87 ml     Assessment/Plan:  71 y.o. male with R heel osteomyelitis 2 Days Post-Op   Plan is for right below the knee amputation with Dr. Stanford Breed tomorrow 1/6 All questions were answered Consent ordered Npo past midnight   Dagoberto Ligas, PA-C [facility] [phone] 10/19/2021 7:36 AM  VASCULAR STAFF ADDENDUM: I agree with the above.   [name]. Stanford Breed, MD [facility] Phone Number: [phone] 10/19/2021 1:40 PM

## 2021-10-19 NOTE — Progress Notes (Signed)
Please call sister [name]) before patient goes down for procedure in AM. Thank you.    Linda [phone]

## 2021-10-19 NOTE — Progress Notes (Addendum)
[facility] Progress Note   Subjective: Seen in room, eating lunch. No C/Os. Going for R BKA per [facility] 10/20/2021. No C/Os.   Objective Vitals:   10/18/21 2128 10/19/21 0526 10/19/21 0600 10/19/21 0908  BP: 139/63 140/65  (!) 130/55  Pulse: 85 81  79  Resp: 17 16  14   Temp: 99 F (37.2 C) 97.9 F (36.6 C)  98.9 F (37.2 C)  TempSrc:  Axillary    SpO2: 98% 100%  98%  Weight:   50.7 kg   Height:       Physical Exam General: Chronically ill appearing male in NAD Heart: S1,S2 RRR no M/R. SR 1st degree AVB on monitor  Lungs: CTAB. 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  Dialysis Access: [facility] drsg CDI. Has R AVG + T/B which is unusable    Additional Objective Labs: Basic Metabolic Panel: Recent Labs  Lab 10/17/21 0221 10/18/21 0320 10/19/21 0718  NA 135 136 135  K 4.2 4.4 3.6  CL 100 99 99  CO2 22 23 26   GLUCOSE 154* 157* 139*  BUN 22 28* 13  CREATININE 4.71* 6.40* 4.45*  CALCIUM 8.9 9.3 8.8*  PHOS 1.6*  --   --    Liver Function Tests: Recent Labs  Lab 10/12/21 2231 10/17/21 0221  AST 24  --   ALT 15  --   ALKPHOS 79  --   BILITOT 0.5  --   PROT 8.6*  --   ALBUMIN 3.3* 3.0*   No results for input(s): LIPASE, AMYLASE in the last 168 hours. CBC: Recent Labs  Lab 10/12/21 2231 10/13/21 1606 10/15/21 0407 10/16/21 0801 10/16/21 1842 10/17/21 0221 10/18/21 0320 10/19/21 0718  WBC 9.3   < > 8.6 9.0  --  6.7 6.7 6.5  NEUTROABS 6.3  --   --   --   --   --   --  3.9  HGB 6.6*   < > 7.5* 8.0*   < > 8.4* 7.6* 7.5*  HCT 20.6*   < > 22.9* 23.9*   < > 25.1* 22.8* 23.4*  MCV 95.8   < > 94.2 95.2  --  94.4 94.2 95.1  PLT 263   < > 218 219  --  212 220 213   < > = values in this interval not displayed.   Blood Culture    Component Value Date/Time   SDES URINE, CLEAN CATCH 10/14/2021 1040   [place] 10/14/2021 1040   CULT  10/14/2021 1040    NO GROWTH Performed at  [facility], [place] [address] [place],  [address]    REPTSTATUS 10/15/2021 FINAL 10/14/2021 1040    Cardiac Enzymes: No results for input(s): CKTOTAL, CKMB, CKMBINDEX, TROPONINI in the last 168 hours. CBG: Recent Labs  Lab 10/18/21 1323 10/18/21 1637 10/18/21 2128 10/19/21 0610 10/19/21 1132  GLUCAP 151* 132* 175* 127* 139*   Iron Studies: No results for input(s): IRON, TIBC, TRANSFERRIN, FERRITIN in the last 72 hours. @lablastinr3 @ Studies/Results: No results found. Medications:  sodium chloride 10 mL/hr (10/17/21 1018)   heparin 1,200 Units/hr (10/19/21 0056)    amLODipine  10 mg Oral Daily   calcium acetate  667 mg Oral TID WC   Chlorhexidine Gluconate Cloth  6 each Topical Q0600   [START ON 10/20/2021] darbepoetin (ARANESP) injection - DIALYSIS  100 mcg Intravenous Q Fri-HD   feeding supplement (NEPRO CARB STEADY)  237 mL Oral TID BM   ferrous sulfate  325 mg Oral Q breakfast   insulin aspart  0-6 Units Subcutaneous TID WC   multivitamin  1 tablet Oral QHS   pantoprazole  40 mg Oral BID   polyethylene glycol  17 g Oral BID   senna  1 tablet Oral Daily   sodium chloride flush  3 mL Intravenous Q12H     Dialysis Orders: Center: [facility]  on MWF . 180NRe, 3 hr 15 min, BFR 400, DFR A1.5, EDW 54kg, 2K 2Ca, TDC AVG with suture intact. no heparin Mircera 211mcg IV q HD- last dose was 114mcg on 10/19 Calcium acetate 667mg  1 tab TID with meals Tums 200mg - 2 tabs PO BID   Assessment/Plan:  Symptomatic anemia: GI bleed suspected, GI is on board. Pt currently on PPI, Eliquis on hold.  Went for endoscopy today. One non-bleeding superficial gastric ulcer with no stigmata of bleeding was found in the prepyloric region of the stomach otherwise normal exam.  S/P 1 unit PRBCs 10/13/2021. HGB 7.5 today. ESA due tomorrow.  Unstageable Ulceration R heel with calcaneus Osteomyelitis. Seen by ID. No ABX. [facility] planning R BKA 10/20/2021 per Dr. Stanford Breed. Marland Kitchen   ESRD:  Dialysis on MWF  schedule, next HD 10/20/2021. Schedule around OR schedule.   Hypertension/volume: BP better controlled today. Very much under EDW.  Lower EDW on DC.  UF as tolerated.   Anemia: Due to #1 above. Aranesp 100 mcg IV 10/13/2021.  Will increase dose to 200 mcg IV 01/06/202. Follow trends.   Metabolic bone disease: Calcium on the high side, noted he takes tums and calcium acetate outpatient. Currently NPO but will resume calcium acetate once he is eating, hold tums for now.   Nutrition:  Renal diet. Low albumin in setting of Osteo. Will order protein supps.   Malfunctioning AVG: Went to [facility] for [facility] placement and shuntogram earlier in December 2022. Needs referral to [facility] for new access when he is stable.   Rita H. Brown NP-C 10/19/2021, 12:19 PM  [facility] [phone]   Seen and examined independently.  Agree with note and exam as documented above by physician extender and as noted here.  General adult male in bed in no acute distress HEENT normocephalic atraumatic extraocular movements intact sclera anicteric Neck supple trachea midline Lungs clear to auscultation bilaterally normal work of breathing at rest  Heart S1S2 no rub Abdomen soft nontender nondistended Extremities no edema left BKA Neuro - alert and oriented x 3 provides hx and follows commands Psych - discouraged  Access left IJ tunn catheter and old RUE AVF thrill and thrill.   Anemia - s/p EGD  One non-bleeding superficial gastric ulcer with no stigmata of bleeding. 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

## 2021-10-19 NOTE — Progress Notes (Signed)
Per pharmacy "[facility]VS said heparin can be held on call to OR."

## 2021-10-19 NOTE — Progress Notes (Signed)
PT Cancellation Note  Patient Details Name: Roger Vazquez MRN: [id number] DOB: Dec 16, 1950   Cancelled Treatment:    Reason Eval/Treat Not Completed: Patient declined, no reason specified. PT attempted to see patient twice this morning. Initially pt reports 10/10 pain, reports not having pain medicine, and declines mobility. PT returns later after the patient received pain meds. Initially the patient reports improvement in symptoms. When later asked to mobilize patient again reports 10/10 pain and refuses to participate. PT will attempt to follow up as time allows.   Zenaida Niece 10/19/2021, 10:48 AM

## 2021-10-19 NOTE — Progress Notes (Addendum)
°   10/19/21 1435  Clinical Encounter Type  Visited With Patient  Visit Type Initial;Psychological support;Spiritual support  Referral From Nurse  Consult/Referral To Chaplain   Chaplain Jorene Guest responded to the consult request. Before entering the room, the attending nurse explained the patient is facing a major life transition. The patient said he has a lot on his mind. Ike Bene asked guided questions to assist the patient in exploring his feelings and provided a listening presence as the patient talked about his concerns about his upcoming procedure and his faith. The patient said he is the only son and have three supporting sisters. He does not have children. The patient said he knows God has everything in control. The patient said he appreciated chaplain's visit and his Doristine Bosworth, Rev Dr. Domingo Cocking had visited yesterday. Ike Bene ended the visit with prayer per the patient's request. This note was prepared by Jeanine Luz, M.Div..  For questions please contact by phone [phone].

## 2021-10-19 NOTE — Progress Notes (Signed)
Progress Note    Roger Vazquez   LOV:[id number]  DOB: Aug 11, 1951  DOA: 10/12/2021     6 PCP: [facility]  Initial CC: abnormal labs  Hospital Course: Roger Vazquez is a 71 yo male with PMH ESRD on HD MWF, PAD s/p LBKA, DMII, HTN, anemia of chronic disease who presented with a hemoglobin of 6.4 g/dL from his SNF He was also reported to be feeling weak and tired. He underwent evaluation with GI regarding his anemia with EGD and colonoscopy.  EGD showed small gastric ulcer and he was continued on a PPI.  Colonoscopy showed diverticulosis.  He also had an ongoing wound to his right heel.  He underwent further evaluation with MRI of the right foot which showed osteomyelitis involving the calcaneus.  He was evaluated by vascular surgery recommendations for a right BKA.  Interval History:  No events overnight.  Seems a little aggravated about requiring BKA tomorrow and has not signed consent yet.  He does understand the need for surgery.  Tentative plan is still for right BKA tomorrow.  Assessment & Plan: * Symptomatic anemia- (present on admission) -Patient with Hgb 12.6 on 11/2 and 6.6 g/dL on admission - s/p EGD (small gastric ulcer) and CLN (diverticulosis) - indefinite PPI daily for gastric ulcer. No repeat CLN needed for surveillance/screening purposes   Acute osteomyelitis of right calcaneus [facility]) -Patient with known heel ulcer, has been followed by vascular -He had failed ABIs recently with inadequate perfusion to promote wound healing - evaluated by vascular surgery with recommendations for RBKA on 10/20/21 - continue holding abx; ID recommends peri-op abx per routine - he is wheelchair bound; already had LBKA and does not use a prosthesis  ESRD on hemodialysis [facility]) -Patient on chronic MWF HD - Friday session to be modified around OR schedule for BKA - continue HD inpt per nephrology -Continue Phoslo  Acute gastric ulcer without hemorrhage or perforation -  Continue daily PPI indefinitely  DVT (deep venous thrombosis) ([place]) -Duplex (10/16/21) notes chronic DVT of SF junction -Has been on Eliquis -Hold Eliquis for now - currently on heparin drip   Lymphadenopathy -He is due for lymph node biopsy in the axillary region on 1/3 at the [place]  HTN (hypertension)- (present on admission) -Continue Norvasc -Will cover with prn IV hydralazine  Type 2 diabetes mellitus with chronic kidney disease on chronic dialysis ([place]) -Prior A1c was 7.9% on 04/27/21 - repeat A1c now 6.6% on 10/18/21 - continue SSI and CBG monitoring    Old records reviewed in assessment of this patient  Antimicrobials: Rocephin 10/14/21 x 1  DVT prophylaxis: HSQ  Code Status:   Code Status: Full Code  Disposition Plan:  [facility] if able to return  Status is: Inpt  Objective: Blood pressure (!) 130/55, pulse 79, temperature 98.9 F (37.2 C), resp. rate 14, height 5\' 6"  (1.676 m), weight 50.7 kg, SpO2 98 %.  Examination:  Physical Exam Constitutional:      General: He is not in acute distress.    Appearance: Normal appearance.  HENT:     Head: Normocephalic and atraumatic.     Mouth/Throat:     Mouth: Mucous membranes are moist.  Eyes:     Extraocular Movements: Extraocular movements intact.  Cardiovascular:     Rate and Rhythm: Normal rate and regular rhythm.     Heart sounds: Normal heart sounds.  Pulmonary:     Effort: Pulmonary effort is normal. No respiratory distress.     Breath  sounds: Normal breath sounds. No wheezing.  Abdominal:     General: Bowel sounds are normal. There is no distension.     Palpations: Abdomen is soft.     Tenderness: There is no abdominal tenderness.  Musculoskeletal:     Cervical back: Normal range of motion and neck supple.     Comments: L BKA noted. Right foot noted with multiple onychomycotic toe nails with TTP throughout foot  Skin:    General: Skin is warm and dry.  Neurological:     General: No focal deficit  present.     Mental Status: He is alert.  Psychiatric:        Mood and Affect: Mood normal.        Behavior: Behavior normal.     Consultants:  Vascular surgery ID  Procedures:    Data Reviewed: I have personally reviewed labs and imaging studies    LOS: 6 days  Time spent: Greater than 50% of the 35 minute visit was spent in counseling/coordination of care for the patient as laid out in the A&P.   Roger Dee, MD [facility] 10/19/2021, 5:21 PM

## 2021-10-19 NOTE — Progress Notes (Signed)
ANTICOAGULATION CONSULT NOTE - Initial Consult  Pharmacy Consult for heparin  Indication: DVT  Allergies  Allergen Reactions   Sildenafil Other (See Comments)    Pt stated, "Makes my Blood Pressure drop"    Patient Measurements: Height: 5\' 6"  (167.6 cm) Weight: 50.7 kg (111 lb 12.4 oz) IBW/kg (Calculated) : 63.8   Vital Signs: Temp: 98.9 F (37.2 C) (01/05 0908) Temp Source: Axillary (01/05 0526) BP: 130/55 (01/05 0908) Pulse Rate: 79 (01/05 0908)  Labs: Recent Labs    10/17/21 0221 10/17/21 2131 10/18/21 0320 10/18/21 1227 10/18/21 2238 10/19/21 0718  HGB 8.4*  --  7.6*  --   --  7.5*  HCT 25.1*  --  22.8*  --   --  23.4*  PLT 212  --  220  --   --  213  APTT  --    < > 52* 64* 73*  --   HEPARINUNFRC  --   --  0.24* 0.25* 0.17* 0.37  CREATININE 4.71*  --  6.40*  --   --  4.45*   < > = values in this interval not displayed.     Estimated Creatinine Clearance: 10.9 mL/min (A) (by C-G formula based on SCr of 4.45 mg/dL (H)).   Medical History: Past Medical History:  Diagnosis Date   Anemia    Blood dyscrasia    history of coagulation deficit per [facility] L[facility] nursing home   Cancer [facility])    Lung   Diabetes mellitus    Hepatitis    Hypertension    Renal disorder    MWF- [facility]    Medications:  Medications Prior to Admission  Medication Sig Dispense Refill Last Dose   acetaminophen (TYLENOL) 325 MG tablet Take 650 mg by mouth every 6 (six) hours.   Past Week   amLODipine (NORVASC) 10 MG tablet Take 1 tablet (10 mg total) by mouth daily.   10/12/2021   apixaban (ELIQUIS) 5 MG TABS tablet Take 1 tablet (5 mg total) by mouth 2 (two) times daily. 60 tablet  10/12/2021 at 9am   aspirin 81 MG chewable tablet Chew 81 mg by mouth daily. (0800)   10/12/2021   calcium acetate (PHOSLO) 667 MG capsule Take 667 mg by mouth 3 (three) times daily with meals. (0800, 1300 & 1700)   10/12/2021   ferrous sulfate 325 (65 FE) MG tablet Take 325 mg by mouth  daily with breakfast.   10/13/2021   hydrocortisone (ANUSOL-HC) 25 MG suppository Place 25 mg rectally 2 (two) times daily as needed for hemorrhoids or anal itching.   unk   lidocaine-prilocaine (EMLA) cream Apply 1 application topically See admin instructions. As needed on dialysis days.Laurine Blazer and Friday)   Past Week   Multiple Vitamin (MULITIVITAMIN WITH MINERALS) TABS Take 1 tablet by mouth daily. 30 tablet 0 10/12/2021   Nutritional Supplements (NOVASOURCE RENAL PO) Take 237 mLs by mouth daily.   Past Week   oxyCODONE (OXY IR/ROXICODONE) 5 MG immediate release tablet Take 5 mg by mouth 2 (two) times daily as needed for severe pain.   10/12/2021   polyethylene glycol (MIRALAX / GLYCOLAX) 17 g packet Take 17 g by mouth 2 (two) times daily. 14 each 0 Past Week   senna (SENOKOT) 8.6 MG TABS tablet Take 1 tablet (8.6 mg total) by mouth daily. 7 tablet 0 10/12/2021   apixaban (ELIQUIS) 5 MG TABS tablet Take 2 tablets (10 mg total) by mouth 2 (two) times daily for 7 doses. (Patient  not taking: Reported on 10/13/2021) 60 tablet  Not Taking   Scheduled:   amLODipine  10 mg Oral Daily   calcium acetate  667 mg Oral TID WC   Chlorhexidine Gluconate Cloth  6 each Topical Q0600   [START ON 10/20/2021] darbepoetin (ARANESP) injection - DIALYSIS  100 mcg Intravenous Q Fri-HD   feeding supplement (NEPRO CARB STEADY)  237 mL Oral TID BM   ferrous sulfate  325 mg Oral Q breakfast   insulin aspart  0-6 Units Subcutaneous TID WC   multivitamin  1 tablet Oral QHS   pantoprazole  40 mg Oral BID   polyethylene glycol  17 g Oral BID   senna  1 tablet Oral Daily   sodium chloride flush  3 mL Intravenous Q12H    Assessment: 71 yo male with recent DVT (07/2021) on apixaban PTA (last dose taken is unclear). He is noted with symptomatic anemia with plans for endoscopy/colonoscopy on 1/3. Pharmacy consulted to dose heparin. He is noted with ESRD  Pt is s/p EGD and colonoscopy. Heparin is therapeutic  today. Plan for BKA tomorrow. Heparin can be held on call to OR per PA.    Goal of Therapy:  Heparin level= 0.3-0.7 Monitor platelets by anticoagulation protocol: Yes   Plan:  Cont heparin 1200 units/hr Daily HL/CBC F/u resume apixaban after [place], PharmD, BCIDP, AAHIVP, CPP Infectious Disease Pharmacist 10/19/2021 10:16 AM

## 2021-10-19 NOTE — Progress Notes (Signed)
OT Cancellation Note  Patient Details Name: Roger Vazquez MRN: [id number] DOB: March 16, 1951   Cancelled Treatment:    Reason Eval/Treat Not Completed: Patient declined, no reason specified. Pt adamantly refused despite max encouragement from OT and PT  Britt Bottom 10/19/2021, 10:18 AM

## 2021-10-19 NOTE — Progress Notes (Signed)
ANTICOAGULATION CONSULT NOTE - Follow Up Consult  Pharmacy Consult for heparin Indication: DVT  Labs: Recent Labs    10/16/21 0801 10/16/21 1738 10/16/21 1842 10/17/21 0221 10/17/21 2131 10/18/21 0320 10/18/21 1227 10/18/21 2238  HGB 8.0*  --  9.3* 8.4*  --  7.6*  --   --   HCT 23.9*  --  28.3* 25.1*  --  22.8*  --   --   PLT 219  --   --  212  --  220  --   --   APTT  --   --   --   --    < > 52* 64* 73*  HEPARINUNFRC  --    < >  --   --   --  0.24* 0.25* 0.17*  CREATININE  --   --   --  4.71*  --  6.40*  --   --    < > = values in this interval not displayed.    Assessment: 71yo male subtherapeutic on heparin with lower heparin level despite increased rate, likely d/t Eliquis clearing; no infusion issues or signs of bleeding per RN.  Goal of Therapy:  Heparin level 0.3-0.7 units/ml   Plan:  Will increase heparin infusion by 4 units/kg/hr to 1200 units/hr and check level in 8 hours.    Wynona Neat, PharmD, BCPS  10/19/2021,12:18 AM

## 2021-10-20 ENCOUNTER — Encounter (HOSPITAL_COMMUNITY)
Admission: EM | Disposition: A | Discharge status: Skilled Nursing Facility | Payer: Self-pay | Source: Skilled Nursing Facility | Attending: Internal Medicine

## 2021-10-20 ENCOUNTER — Encounter (HOSPITAL_COMMUNITY): Payer: Self-pay | Admitting: Internal Medicine

## 2021-10-20 ENCOUNTER — Inpatient Hospital Stay (HOSPITAL_COMMUNITY): Payer: No Typology Code available for payment source | Admitting: General Practice

## 2021-10-20 DIAGNOSIS — M86171 Other acute osteomyelitis, right ankle and foot: Secondary | ICD-10-CM | POA: Diagnosis not present

## 2021-10-20 DIAGNOSIS — D649 Anemia, unspecified: Secondary | ICD-10-CM | POA: Diagnosis not present

## 2021-10-20 HISTORY — PX: AMPUTATION: SHX166

## 2021-10-20 HISTORY — PX: APPLICATION OF WOUND VAC: SHX5189

## 2021-10-20 LAB — MAGNESIUM: Magnesium: 2.1 mg/dL (ref 1.7–2.4)

## 2021-10-20 LAB — GLUCOSE, CAPILLARY
Glucose-Capillary: 143 mg/dL — ABNORMAL HIGH (ref 70–99)
Glucose-Capillary: 142 mg/dL — ABNORMAL HIGH (ref 70–99)
Glucose-Capillary: 160 mg/dL — ABNORMAL HIGH (ref 70–99)
Glucose-Capillary: 136 mg/dL — ABNORMAL HIGH (ref 70–99)
Glucose-Capillary: 192 mg/dL — ABNORMAL HIGH (ref 70–99)

## 2021-10-20 LAB — CBC WITH DIFFERENTIAL/PLATELET
Abs Immature Granulocytes: 0.15 10*3/uL — ABNORMAL HIGH (ref 0.00–0.07)
Basophils Absolute: 0 10*3/uL (ref 0.0–0.1)
Basophils Relative: 0 %
Eosinophils Absolute: 0.3 10*3/uL (ref 0.0–0.5)
Eosinophils Relative: 2 %
HCT: 25.8 % — ABNORMAL LOW (ref 39.0–52.0)
Hemoglobin: 8.3 g/dL — ABNORMAL LOW (ref 13.0–17.0)
Immature Granulocytes: 1 %
Lymphocytes Relative: 13 %
Lymphs Abs: 1.4 10*3/uL (ref 0.7–4.0)
MCH: 30.9 pg (ref 26.0–34.0)
MCHC: 32.2 g/dL (ref 30.0–36.0)
MCV: 95.9 fL (ref 80.0–100.0)
Monocytes Absolute: 0.9 10*3/uL (ref 0.1–1.0)
Monocytes Relative: 8 %
Neutro Abs: 8.5 10*3/uL — ABNORMAL HIGH (ref 1.7–7.7)
Neutrophils Relative %: 76 %
Platelets: 206 10*3/uL (ref 150–400)
RBC: 2.69 MIL/uL — ABNORMAL LOW (ref 4.22–5.81)
RDW: 19.3 % — ABNORMAL HIGH (ref 11.5–15.5)
WBC: 11.2 10*3/uL — ABNORMAL HIGH (ref 4.0–10.5)
nRBC: 0 % (ref 0.0–0.2)

## 2021-10-20 LAB — POCT I-STAT, CHEM 8
BUN: 37 mg/dL — ABNORMAL HIGH (ref 8–23)
Calcium, Ion: 1.07 mmol/L — ABNORMAL LOW (ref 1.15–1.40)
Chloride: 98 mmol/L (ref 98–111)
Creatinine, Ser: 6.7 mg/dL — ABNORMAL HIGH (ref 0.61–1.24)
Glucose, Bld: 154 mg/dL — ABNORMAL HIGH (ref 70–99)
HCT: 29 % — ABNORMAL LOW (ref 39.0–52.0)
Hemoglobin: 9.9 g/dL — ABNORMAL LOW (ref 13.0–17.0)
Potassium: 4.4 mmol/L (ref 3.5–5.1)
Sodium: 134 mmol/L — ABNORMAL LOW (ref 135–145)
TCO2: 28 mmol/L (ref 22–32)

## 2021-10-20 LAB — BASIC METABOLIC PANEL
Anion gap: 10 (ref 5–15)
BUN: 11 mg/dL (ref 8–23)
CO2: 26 mmol/L (ref 22–32)
Calcium: 8.7 mg/dL — ABNORMAL LOW (ref 8.9–10.3)
Chloride: 97 mmol/L — ABNORMAL LOW (ref 98–111)
Creatinine, Ser: 3.39 mg/dL — ABNORMAL HIGH (ref 0.61–1.24)
GFR, Estimated: 19 mL/min — ABNORMAL LOW (ref >60)
Glucose, Bld: 171 mg/dL — ABNORMAL HIGH (ref 70–99)
Potassium: 4.2 mmol/L (ref 3.5–5.1)
Sodium: 133 mmol/L — ABNORMAL LOW (ref 135–145)

## 2021-10-20 SURGERY — AMPUTATION BELOW KNEE
Anesthesia: Monitor Anesthesia Care | Site: Leg Lower | Laterality: Right

## 2021-10-20 MED ORDER — LIDOCAINE HCL (PF) 1 % IJ SOLN: 5.0000 mL | INTRAMUSCULAR | Status: DC | PRN

## 2021-10-20 MED ORDER — FENTANYL CITRATE (PF) 100 MCG/2ML IJ SOLN
INTRAMUSCULAR | Status: AC
  Administered 2021-10-20: 50 ug via INTRAVENOUS
  Filled 2021-10-20: qty 2

## 2021-10-20 MED ORDER — ORAL CARE MOUTH RINSE: 15.0000 mL | Freq: Once | OROMUCOSAL | Status: AC

## 2021-10-20 MED ORDER — ORAL CARE MOUTH RINSE: 15.0000 mL | Freq: Once | OROMUCOSAL | Status: DC

## 2021-10-20 MED ORDER — 0.9 % SODIUM CHLORIDE (POUR BTL) OPTIME
TOPICAL | Status: DC | PRN
  Administered 2021-10-20: 3000 mL

## 2021-10-20 MED ORDER — CHLORHEXIDINE GLUCONATE 0.12 % MT SOLN
15.0000 mL | Freq: Once | OROMUCOSAL | Status: DC
  Filled 2021-10-20: qty 15

## 2021-10-20 MED ORDER — CEFAZOLIN SODIUM-DEXTROSE 2-4 GM/100ML-% IV SOLN
2.0000 g | Freq: Once | INTRAVENOUS | Status: AC
  Administered 2021-10-20: 2 g via INTRAVENOUS

## 2021-10-20 MED ORDER — SODIUM CHLORIDE 0.9 % IV SOLN: 100.0000 mL | INTRAVENOUS | Status: DC | PRN

## 2021-10-20 MED ORDER — CEFAZOLIN SODIUM-DEXTROSE 2-4 GM/100ML-% IV SOLN
INTRAVENOUS | Status: AC
  Filled 2021-10-20: qty 100

## 2021-10-20 MED ORDER — FENTANYL CITRATE (PF) 100 MCG/2ML IJ SOLN: 50.0000 ug | Freq: Once | INTRAMUSCULAR | Status: AC

## 2021-10-20 MED ORDER — ONDANSETRON HCL 4 MG/2ML IJ SOLN
INTRAMUSCULAR | Status: DC | PRN
  Administered 2021-10-20: 4 mg via INTRAVENOUS

## 2021-10-20 MED ORDER — ALTEPLASE 2 MG IJ SOLR: 2.0000 mg | Freq: Once | INTRAMUSCULAR | Status: DC | PRN

## 2021-10-20 MED ORDER — PHENYLEPHRINE HCL-NACL 20-0.9 MG/250ML-% IV SOLN
INTRAVENOUS | Status: DC | PRN
  Administered 2021-10-20: 20 ug/min via INTRAVENOUS

## 2021-10-20 MED ORDER — CHLORHEXIDINE GLUCONATE 0.12 % MT SOLN: 15.0000 mL | Freq: Once | OROMUCOSAL | Status: AC

## 2021-10-20 MED ORDER — HYDROMORPHONE HCL 1 MG/ML IJ SOLN
0.5000 mg | INTRAMUSCULAR | Status: DC | PRN
  Administered 2021-10-20 – 2021-10-24 (×10): 0.5 mg via INTRAVENOUS
  Filled 2021-10-20: qty 0.5
  Filled 2021-10-20 (×5): qty 1
  Filled 2021-10-20: qty 0.5
  Filled 2021-10-20 (×3): qty 1

## 2021-10-20 MED ORDER — LIDOCAINE-PRILOCAINE 2.5-2.5 % EX CREA: 1.0000 "application " | TOPICAL_CREAM | CUTANEOUS | Status: DC | PRN

## 2021-10-20 MED ORDER — FENTANYL CITRATE (PF) 250 MCG/5ML IJ SOLN
INTRAMUSCULAR | Status: AC
  Filled 2021-10-20: qty 5

## 2021-10-20 MED ORDER — PROPOFOL 10 MG/ML IV BOLUS
INTRAVENOUS | Status: DC | PRN
  Administered 2021-10-20: 20 mg via INTRAVENOUS

## 2021-10-20 MED ORDER — HEPARIN (PORCINE) 25000 UT/250ML-% IV SOLN
1500.0000 [IU]/h | INTRAVENOUS | Status: AC
  Administered 2021-10-20: 1200 [IU]/h via INTRAVENOUS
  Administered 2021-10-21: 1300 [IU]/h via INTRAVENOUS
  Administered 2021-10-22 – 2021-10-24 (×3): 1500 [IU]/h via INTRAVENOUS
  Filled 2021-10-20 (×5): qty 250

## 2021-10-20 MED ORDER — HEPARIN SODIUM (PORCINE) 1000 UNIT/ML DIALYSIS
1000.0000 [IU] | INTRAMUSCULAR | Status: DC | PRN
  Filled 2021-10-20: qty 1

## 2021-10-20 MED ORDER — PROPOFOL 500 MG/50ML IV EMUL
INTRAVENOUS | Status: DC | PRN
  Administered 2021-10-20: 75 ug/kg/min via INTRAVENOUS

## 2021-10-20 MED ORDER — MIDAZOLAM HCL 2 MG/2ML IJ SOLN
INTRAMUSCULAR | Status: AC
  Filled 2021-10-20: qty 2

## 2021-10-20 MED ORDER — SODIUM CHLORIDE 0.9 % IV SOLN: INTRAVENOUS | Status: DC

## 2021-10-20 MED ORDER — BACITRACIN ZINC 500 UNIT/GM EX OINT
TOPICAL_OINTMENT | CUTANEOUS | Status: AC
  Filled 2021-10-20: qty 28.35

## 2021-10-20 MED ORDER — PENTAFLUOROPROP-TETRAFLUOROETH EX AERO: 1.0000 "application " | INHALATION_SPRAY | CUTANEOUS | Status: DC | PRN

## 2021-10-20 MED ORDER — PROPOFOL 10 MG/ML IV BOLUS
INTRAVENOUS | Status: AC
  Filled 2021-10-20: qty 20

## 2021-10-20 MED ORDER — CHLORHEXIDINE GLUCONATE 0.12 % MT SOLN
OROMUCOSAL | Status: AC
  Administered 2021-10-20: 15 mL via OROMUCOSAL
  Filled 2021-10-20: qty 15

## 2021-10-20 SURGICAL SUPPLY — 66 items
APL PRP STRL LF DISP 70% ISPRP (MISCELLANEOUS) ×2
BAG COUNTER SPONGE SURGICOUNT (BAG) ×3 IMPLANT
BAG SPNG CNTER NS LX DISP (BAG) ×2
BLADE LONG MED 31X9 (MISCELLANEOUS) IMPLANT
BLADE SAGITTAL (BLADE)
BLADE SAGITTAL 25.0X1.19X90 (BLADE) IMPLANT
BLADE SAW GIGLI 510 (BLADE) ×2 IMPLANT
BLADE SAW THK.89X75X18XSGTL (BLADE) IMPLANT
BLADE SURG 21 STRL SS (BLADE) ×3 IMPLANT
BNDG CMPR 9X4 STRL LF SNTH (GAUZE/BANDAGES/DRESSINGS) ×2
BNDG COHESIVE 6X5 TAN STRL LF (GAUZE/BANDAGES/DRESSINGS) ×3 IMPLANT
BNDG ELASTIC 4X5.8 VLCR STR LF (GAUZE/BANDAGES/DRESSINGS) IMPLANT
BNDG ELASTIC 6X5.8 VLCR STR LF (GAUZE/BANDAGES/DRESSINGS) IMPLANT
BNDG ESMARK 4X9 LF (GAUZE/BANDAGES/DRESSINGS) ×1 IMPLANT
BNDG GAUZE ELAST 4 BULKY (GAUZE/BANDAGES/DRESSINGS) IMPLANT
BUR WHEEL 25.0 DIAMOND (BURR) ×1 IMPLANT
CANISTER SUCT 3000ML PPV (MISCELLANEOUS) ×3 IMPLANT
CANISTER WOUND CARE 500ML ATS (WOUND CARE) ×1 IMPLANT
CHLORAPREP W/TINT 26 (MISCELLANEOUS) ×3 IMPLANT
COVER SURGICAL LIGHT HANDLE (MISCELLANEOUS) ×3 IMPLANT
DRAIN CHANNEL 19F RND (DRAIN) IMPLANT
DRAPE DERMATAC (DRAPES) IMPLANT
DRAPE HALF SHEET 40X57 (DRAPES) ×3 IMPLANT
DRAPE INCISE IOBAN 66X45 STRL (DRAPES) ×1 IMPLANT
DRAPE ORTHO SPLIT 77X108 STRL (DRAPES) ×6
DRAPE SURG ORHT 6 SPLT 77X108 (DRAPES) ×4 IMPLANT
DRESSING PREVENA PLUS CUSTOM (GAUZE/BANDAGES/DRESSINGS) IMPLANT
DRSG PREVENA PLUS CUSTOM (GAUZE/BANDAGES/DRESSINGS)
ELECT REM PT RETURN 9FT ADLT (ELECTROSURGICAL) ×3
ELECTRODE REM PT RTRN 9FT ADLT (ELECTROSURGICAL) ×2 IMPLANT
EVACUATOR SILICONE 100CC (DRAIN) IMPLANT
GAUZE 4X4 16PLY ~~LOC~~+RFID DBL (SPONGE) ×1 IMPLANT
GAUZE SPONGE 4X4 12PLY STRL (GAUZE/BANDAGES/DRESSINGS) ×3 IMPLANT
GAUZE XEROFORM 5X9 LF (GAUZE/BANDAGES/DRESSINGS) ×3 IMPLANT
GLOVE SURG POLYISO LF SZ8 (GLOVE) ×3 IMPLANT
GOWN STRL REUS W/ TWL LRG LVL3 (GOWN DISPOSABLE) ×4 IMPLANT
GOWN STRL REUS W/ TWL XL LVL3 (GOWN DISPOSABLE) ×2 IMPLANT
GOWN STRL REUS W/TWL LRG LVL3 (GOWN DISPOSABLE) ×6
GOWN STRL REUS W/TWL XL LVL3 (GOWN DISPOSABLE) ×3
KIT BASIN OR (CUSTOM PROCEDURE TRAY) ×3 IMPLANT
KIT TURNOVER KIT B (KITS) ×3 IMPLANT
NS IRRIG 1000ML POUR BTL (IV SOLUTION) ×3 IMPLANT
PACK GENERAL/GYN (CUSTOM PROCEDURE TRAY) ×3 IMPLANT
PAD ARMBOARD 7.5X6 YLW CONV (MISCELLANEOUS) ×6 IMPLANT
PAD CAST 4YDX4 CTTN HI CHSV (CAST SUPPLIES) IMPLANT
PADDING CAST COTTON 4X4 STRL (CAST SUPPLIES) ×3
PENCIL SMOKE EVACUATOR (MISCELLANEOUS) ×3 IMPLANT
PREVENA RESTOR ARTHOFORM 46X30 (CANNISTER) ×1 IMPLANT
SPONGE T-LAP 18X18 ~~LOC~~+RFID (SPONGE) ×1 IMPLANT
STAPLER SKIN 35 REG (STAPLE) ×3 IMPLANT
STAPLER VISISTAT 35W (STAPLE) ×4 IMPLANT
STOCKINETTE IMPERVIOUS LG (DRAPES) ×3 IMPLANT
SUT BONE WAX W31G (SUTURE) IMPLANT
SUT ETHILON 2 0 PSLX (SUTURE) ×6 IMPLANT
SUT ETHILON 3 0 PS 1 (SUTURE) IMPLANT
SUT SILK 0 TIES 10X30 (SUTURE) ×3 IMPLANT
SUT SILK 2 0 (SUTURE) ×3
SUT SILK 2 0 SH CR/8 (SUTURE) ×3 IMPLANT
SUT SILK 2-0 18XBRD TIE 12 (SUTURE) ×2 IMPLANT
SUT SILK 3 0 (SUTURE)
SUT SILK 3-0 18XBRD TIE 12 (SUTURE) IMPLANT
SUT VIC AB 0 CT1 18XCR BRD 8 (SUTURE) ×4 IMPLANT
SUT VIC AB 0 CT1 8-18 (SUTURE) ×6
TOWEL GREEN STERILE (TOWEL DISPOSABLE) ×6 IMPLANT
UNDERPAD 30X36 HEAVY ABSORB (UNDERPADS AND DIAPERS) ×3 IMPLANT
WATER STERILE IRR 1000ML POUR (IV SOLUTION) ×3 IMPLANT

## 2021-10-20 NOTE — Progress Notes (Signed)
Patient arrived back from right BKA. Alert and oriented, no c/o pain at this time. Nephro supplement given and lunch tray ordered. Wound vac to right leg at 161mmHg. Report given to dialysis nurse. Call bell in reach.

## 2021-10-20 NOTE — Progress Notes (Addendum)
[facility] Progress Note   Subjective:   Patient seen and examined at bedside.  Feeling down due to surgery today.  Right BKA planned for today.  Sister coming in to see him.  Admits to decreased appetite, not eating much when not NPO. Denies CP, abdominal pain and n/v/d.  Admits to intermittent SOB which he believes is positional. No issues with dialysis.   Objective Vitals:   10/19/21 0908 10/19/21 2102 10/20/21 0504 10/20/21 0931  BP: (!) 130/55 (!) 137/55 140/61 (!) 147/69  Pulse: 79 71 77 79  Resp: 14 16 17 16   Temp: 98.9 F (37.2 C) 98 F (36.7 C) 98.2 F (36.8 C) 98.6 F (37 C)  TempSrc:      SpO2: 98% 96% 98% 99%  Weight:      Height:       Physical Exam General:chronically ill appearing male in NAD Heart:RRR, no mrg Lungs:CTAB, nml WOB on RA Abdomen:soft, NTND Extremities:R foot in heel protector, L BKA, no edema b/l Dialysis Access: L IJ TDC, R AVG +b/t un useable   Filed Weights   10/18/21 0833 10/18/21 1230 10/19/21 0600  Weight: 52.7 kg 51.4 kg 50.7 kg    Intake/Output Summary (Last 24 hours) at 10/20/2021 0951 Last data filed at 10/20/2021 0830 Gross per 24 hour  Intake 936.9 ml  Output 25 ml  Net 911.9 ml    Additional Objective Labs: Basic Metabolic Panel: Recent Labs  Lab 10/17/21 0221 10/18/21 0320 10/19/21 0718  NA 135 136 135  K 4.2 4.4 3.6  CL 100 99 99  CO2 22 23 26   GLUCOSE 154* 157* 139*  BUN 22 28* 13  CREATININE 4.71* 6.40* 4.45*  CALCIUM 8.9 9.3 8.8*  PHOS 1.6*  --   --    Liver Function Tests: Recent Labs  Lab 10/17/21 0221  ALBUMIN 3.0*   CBC: Recent Labs  Lab 10/15/21 0407 10/16/21 0801 10/16/21 1842 10/17/21 0221 10/18/21 0320 10/19/21 0718  WBC 8.6 9.0  --  6.7 6.7 6.5  NEUTROABS  --   --   --   --   --  3.9  HGB 7.5* 8.0*   < > 8.4* 7.6* 7.5*  HCT 22.9* 23.9*   < > 25.1* 22.8* 23.4*  MCV 94.2 95.2  --  94.4 94.2 95.1  PLT 218 219  --  212 220 213   < > = values in this interval not  displayed.    CBG: Recent Labs  Lab 10/19/21 1132 10/19/21 1638 10/19/21 2103 10/20/21 0640 10/20/21 0930  GLUCAP 139* 146* 159* 143* 142*   Medications:  sodium chloride 10 mL/hr (10/17/21 1018)   heparin 1,200 Units/hr (10/19/21 1703)    amLODipine  10 mg Oral Daily   calcium acetate  667 mg Oral TID WC   chlorhexidine       Chlorhexidine Gluconate Cloth  6 each Topical Q0600   darbepoetin (ARANESP) injection - DIALYSIS  200 mcg Intravenous Q Fri-HD   feeding supplement (NEPRO CARB STEADY)  237 mL Oral TID BM   ferrous sulfate  325 mg Oral Q breakfast   insulin aspart  0-6 Units Subcutaneous TID WC   multivitamin  1 tablet Oral QHS   pantoprazole  40 mg Oral BID   polyethylene glycol  17 g Oral BID   senna  1 tablet Oral Daily   sodium chloride flush  3 mL Intravenous Q12H    Dialysis Orders: Center: [facility]  on MWF . 180NRe, 3  hr 15 min, BFR 400, DFR A1.5, EDW 54kg, 2K 2Ca, TDC AVG with suture intact. no heparin Mircera 220mcg IV q HD- last dose was 166mcg on 10/19 Calcium acetate 667mg  1 tab TID with meals Tums 200mg - 2 tabs PO BID   Assessment/Plan: Symptomatic anemia: GIB suspected, GI following. Pt currently on PPI, Eliquis on hold.  Endoscopy on 1/3 with 1 non-bleeding superficial gastric ulcer  w/no stigmata of bleeding in the prepyloric region, exam otherwise normal. S/P 1 unit PRBCs 10/13/2021. Lat Hgb 7.5, expect drop post surgery. Follow labs. Aranesp ^ 248mcg qwk to due today. R heel ulceration w/Osteomyelitis. Seen by ID. No ABX. Plan for R BKA today per Dr. Stanford Breed.   ESRD:  Dialysis on MWF schedule, next HD 10/20/2021. Schedule around OR schedule.   Hypertension/volume: BP close to goal. Under dry weight with additional weight loss following surgery.  Will need lower EDW on DC.  UF as tolerated.   Metabolic bone disease: Corrected calcium in high 9s.  Last phos low.  Hold binders for now and trend labs, can add back once appetite improved. Currently NPO.   Nutrition:  Renal diet. Low albumin in setting of Osteo. +protein supplements Malfunctioning AVG: Went to [facility] placement and shuntogram earlier in December 2022. Needs referral to [facility] for new access when he is stable.   Jen Mow, PA-C [facility] 10/20/2021,9:51 AM  LOS: 7 days   Seen and examined independently.  Agree with note and exam as documented above by physician extender and as noted here.  Seen and examined on dialysis.  Procedure supervised.  Blood pressure 136/72 and HR 96.  Tolerating goal.  Left IJ tunn catheter in place  He is a little discouraged because he had a right BKA earlier today.  He denies any current pain.  No issues breathing and on room air.   Claudia Desanctis, MD 10/20/2021  3:13 PM

## 2021-10-20 NOTE — Transfer of Care (Signed)
Immediate Anesthesia Transfer of Care Note  Patient: Roger Vazquez  Procedure(s) Performed: RIGHT BELOW KNEE AMPUTATION (Right: Knee)  Patient Location: PACU  Anesthesia Type:MAC and Regional  Level of Consciousness: awake and drowsy  Airway & Oxygen Therapy: Patient Spontanous Breathing  Post-op Assessment: Report given to RN and Post -op Vital signs reviewed and stable  Post vital signs: Reviewed and stable  Last Vitals:  Vitals Value Taken Time  BP 108/55 10/20/21 1247  Temp    Pulse 74 10/20/21 1248  Resp 12 10/20/21 1248  SpO2 96 % 10/20/21 1248  Vitals shown include unvalidated device data.  Last Pain:  Vitals:   10/20/21 1105  TempSrc:   PainSc: 0-No pain      Patients Stated Pain Goal: 0 (06/89/34 0684)  Complications: No notable events documented.

## 2021-10-20 NOTE — Anesthesia Procedure Notes (Signed)
Procedure Name: MAC Date/Time: 10/20/2021 11:28 AM Performed by: Dorann Lodge, CRNA Pre-anesthesia Checklist: Patient identified, Emergency Drugs available, Suction available, Patient being monitored and Timeout performed Patient Re-evaluated:Patient Re-evaluated prior to induction Oxygen Delivery Method: Simple face mask Preoxygenation: Pre-oxygenation with 100% oxygen Dental Injury: Teeth and Oropharynx as per pre-operative assessment

## 2021-10-20 NOTE — Progress Notes (Signed)
ANTICOAGULATION CONSULT NOTE - Initial Consult  Pharmacy Consult for heparin  Indication: DVT  Allergies  Allergen Reactions   Sildenafil Other (See Comments)    Pt stated, "Makes my Blood Pressure drop"    Patient Measurements: Height: 5\' 6"  (167.6 cm) Weight: 57.3 kg (126 lb 5.2 oz) IBW/kg (Calculated) : 63.8   Vital Signs: Temp: 97.1 F (36.2 C) (01/06 1441) Temp Source: Temporal (01/06 1441) BP: 136/72 (01/06 1500) Pulse Rate: 96 (01/06 1500)  Labs: Recent Labs    10/18/21 0320 10/18/21 1227 10/18/21 2238 10/19/21 0718 10/20/21 1022  HGB 7.6*  --   --  7.5* 9.9*  HCT 22.8*  --   --  23.4* 29.0*  PLT 220  --   --  213  --   APTT 52* 64* 73*  --   --   HEPARINUNFRC 0.24* 0.25* 0.17* 0.37  --   CREATININE 6.40*  --   --  4.45* 6.70*     Estimated Creatinine Clearance: 8.2 mL/min (A) (by C-G formula based on SCr of 6.7 mg/dL (H)).   Medical History: Past Medical History:  Diagnosis Date   Anemia    Blood dyscrasia    history of coagulation deficit per [facility] LPN nursing home   Cancer [facility])    Lung   Diabetes mellitus    Hepatitis    Hypertension    Renal disorder    MWF- [facility]    Medications:  Medications Prior to Admission  Medication Sig Dispense Refill Last Dose   acetaminophen (TYLENOL) 325 MG tablet Take 650 mg by mouth every 6 (six) hours.   Past Week   amLODipine (NORVASC) 10 MG tablet Take 1 tablet (10 mg total) by mouth daily.   10/12/2021   apixaban (ELIQUIS) 5 MG TABS tablet Take 1 tablet (5 mg total) by mouth 2 (two) times daily. 60 tablet  10/12/2021 at 9am   aspirin 81 MG chewable tablet Chew 81 mg by mouth daily. (0800)   10/12/2021   calcium acetate (PHOSLO) 667 MG capsule Take 667 mg by mouth 3 (three) times daily with meals. (0800, 1300 & 1700)   10/12/2021   ferrous sulfate 325 (65 FE) MG tablet Take 325 mg by mouth daily with breakfast.   10/13/2021   hydrocortisone (ANUSOL-HC) 25 MG suppository Place 25 mg  rectally 2 (two) times daily as needed for hemorrhoids or anal itching.   unk   lidocaine-prilocaine (EMLA) cream Apply 1 application topically See admin instructions. As needed on dialysis days.Laurine Blazer and Friday)   Past Week   Multiple Vitamin (MULITIVITAMIN WITH MINERALS) TABS Take 1 tablet by mouth daily. 30 tablet 0 10/12/2021   Nutritional Supplements (NOVASOURCE RENAL PO) Take 237 mLs by mouth daily.   Past Week   oxyCODONE (OXY IR/ROXICODONE) 5 MG immediate release tablet Take 5 mg by mouth 2 (two) times daily as needed for severe pain.   10/12/2021   polyethylene glycol (MIRALAX / GLYCOLAX) 17 g packet Take 17 g by mouth 2 (two) times daily. 14 each 0 Past Week   senna (SENOKOT) 8.6 MG TABS tablet Take 1 tablet (8.6 mg total) by mouth daily. 7 tablet 0 10/12/2021   apixaban (ELIQUIS) 5 MG TABS tablet Take 2 tablets (10 mg total) by mouth 2 (two) times daily for 7 doses. (Patient not taking: Reported on 10/13/2021) 60 tablet  Not Taking   Scheduled:   amLODipine  10 mg Oral Daily   calcium acetate  667 mg Oral TID  WC   Chlorhexidine Gluconate Cloth  6 each Topical Q0600   darbepoetin (ARANESP) injection - DIALYSIS  200 mcg Intravenous Q Fri-HD   feeding supplement (NEPRO CARB STEADY)  237 mL Oral TID BM   ferrous sulfate  325 mg Oral Q breakfast   insulin aspart  0-6 Units Subcutaneous TID WC   multivitamin  1 tablet Oral QHS   pantoprazole  40 mg Oral BID   polyethylene glycol  17 g Oral BID   senna  1 tablet Oral Daily   sodium chloride flush  3 mL Intravenous Q12H    Assessment: 71 yo male with recent DVT (07/2021) on apixaban PTA (last dose taken is unclear). He is noted with symptomatic anemia with plans for endoscopy/colonoscopy on 1/3. Pharmacy consulted to dose heparin. He is noted with ESRD  Pt is s/p EGD and colonoscopy. Heparin has been held for the BKA today. D/w Dr. Stanford Breed and heparin can be resumed 6 hr post procedure. We will resume without bolus. We will  target for a lower therapeutic range.   Goal of Therapy:  Heparin level= 0.3-0.5 Monitor platelets by anticoagulation protocol: Yes   Plan:  Restart heparin 1200 units/hr at 2200 Daily HL/CBC F/u resume apixaban   Onnie Boer, PharmD, BCIDP, AAHIVP, CPP Infectious Disease Pharmacist 10/20/2021 3:21 PM

## 2021-10-20 NOTE — Progress Notes (Signed)
°  Progress Note    10/20/2021 9:37 AM Day of Surgery  Subjective:  No complaints   Vitals:   10/20/21 0504 10/20/21 0931  BP: 140/61 (!) 147/69  Pulse: 77 79  Resp: 17 16  Temp: 98.2 F (36.8 C) 98.6 F (37 C)  SpO2: 98% 99%   Physical Exam: Lungs:  non labored Extremities:  R heel ulcer malodorous Neurologic: A&O  CBC    Component Value Date/Time   WBC 6.5 10/19/2021 0718   RBC 2.46 (L) 10/19/2021 0718   HGB 7.5 (L) 10/19/2021 0718   HCT 23.4 (L) 10/19/2021 0718   PLT 213 10/19/2021 0718   MCV 95.1 10/19/2021 0718   MCH 30.5 10/19/2021 0718   MCHC 32.1 10/19/2021 0718   RDW 19.5 (H) 10/19/2021 0718   LYMPHSABS 1.5 10/19/2021 0718   MONOABS 0.8 10/19/2021 0718   EOSABS 0.3 10/19/2021 0718   BASOSABS 0.0 10/19/2021 0718    BMET    Component Value Date/Time   NA 135 10/19/2021 0718   K 3.6 10/19/2021 0718   CL 99 10/19/2021 0718   CO2 26 10/19/2021 0718   GLUCOSE 139 (H) 10/19/2021 0718   BUN 13 10/19/2021 0718   CREATININE 4.45 (H) 10/19/2021 0718   CALCIUM 8.8 (L) 10/19/2021 0718   CALCIUM 7.1 (L) 08/01/2018 0944   GFRNONAA 13 (L) 10/19/2021 0718   GFRAA 5 (L) 08/11/2018 0858    INR    Component Value Date/Time   INR 1.1 10/13/2021 1300     Intake/Output Summary (Last 24 hours) at 10/20/2021 0937 Last data filed at 10/20/2021 0830 Gross per 24 hour  Intake 936.9 ml  Output 25 ml  Net 911.9 ml     Assessment/Plan:  71 y.o. male with R heel osteomyelitis. Not a revascularization candidate. Plan R below knee amputation today in OR.   Roger Vazquez. Stanford Breed, MD [facility] Phone Number: [phone] 10/20/2021 9:37 AM

## 2021-10-20 NOTE — Progress Notes (Signed)
PT Cancellation Note  Patient Details Name: Roger Vazquez MRN: [id number] DOB: [birth year]   Cancelled Treatment:    Reason Eval/Treat Not Completed: Patient at procedure or test/unavailable. PT will follow up s/p R BKA.   Zenaida Niece 10/20/2021, 11:02 AM

## 2021-10-20 NOTE — Progress Notes (Signed)
Patient arrived back from dialysis, VS and CBG obtained. Alert and oriented, no c/o pain at this time. Dinner tray ordered by this nurse. Patient has no needs at this time, call bell in reach.

## 2021-10-20 NOTE — Progress Notes (Signed)
Attempted to see pt for OT eval but pt currently in surgery for BKA. Will need reorder for OT services to evaluate post surgery. Will look forward to receiving this order. Jinger Neighbors, OTR/L

## 2021-10-20 NOTE — Progress Notes (Signed)
Progress Note    Roger Vazquez   ZOX:[id number]  DOB: 1950/11/19  DOA: 10/12/2021     7 PCP: [facility]  Initial CC: abnormal labs  Hospital Course: Roger Vazquez is a 71 yo male with PMH ESRD on HD MWF, PAD s/p LBKA, DMII, HTN, anemia of chronic disease who presented with a hemoglobin of 6.4 g/dL from his SNF He was also reported to be feeling weak and tired. He underwent evaluation with GI regarding his anemia with EGD and colonoscopy.  EGD showed small gastric ulcer and he was continued on a PPI.  Colonoscopy showed diverticulosis.  He also had an ongoing wound to his right heel.  He underwent further evaluation with MRI of the right foot which showed osteomyelitis involving the calcaneus.  He was evaluated by vascular surgery recommendations for a right BKA which was performed on 10/20/2021.  Interval History:  No events overnight.  Seen this afternoon in his room after returning from Progress. He was awake and comfortable.  He is going to dialysis later this afternoon as well.  Assessment & Plan: * Symptomatic anemia- (present on admission) -Patient with Hgb 12.6 on 11/2 and 6.6 g/dL on admission - s/p EGD (small gastric ulcer) and CLN (diverticulosis) - indefinite PPI daily for gastric ulcer. No repeat CLN needed for surveillance/screening purposes   Acute osteomyelitis of right calcaneus ([place]) -Patient with known heel ulcer, has been followed by vascular -He had failed ABIs recently with inadequate perfusion to promote wound healing - evaluated by vascular surgery with recommendations for RBKA on 10/20/21 - continue holding abx; ID recommends peri-op abx per routine - he is wheelchair bound; already had LBKA and does not use a prosthesis -post op PT/OT evals once able   ESRD on hemodialysis ([place]) -Patient on chronic MWF HD - continue HD inpt per nephrology -Continue Phoslo  Acute gastric ulcer without hemorrhage or perforation - Continue daily PPI  indefinitely  DVT (deep venous thrombosis) ([place]) -Duplex (10/16/21) notes chronic DVT of SF junction -Has been on Eliquis -Hold Eliquis for now - currently on heparin drip   Lymphadenopathy -He is due for lymph node biopsy in the axillary region on 1/3 at the [place]  HTN (hypertension)- (present on admission) -Continue Norvasc -Will cover with prn IV hydralazine  Type 2 diabetes mellitus with chronic kidney disease on chronic dialysis ([place]) -Prior A1c was 7.9% on 04/27/21 - repeat A1c now 6.6% on 10/18/21 - continue SSI and CBG monitoring    Old records reviewed in assessment of this patient  Antimicrobials: Rocephin 10/14/21 x 1  DVT prophylaxis: HSQ  Code Status:   Code Status: Full Code  Disposition Plan:  [facility] if able to return  Status is: Inpt  Objective: Blood pressure 126/66, pulse 72, temperature 97.9 F (36.6 C), resp. rate 18, height 5\' 6"  (1.676 m), weight 50.7 kg, SpO2 94 %.  Examination:  Physical Exam Constitutional:      General: He is not in acute distress.    Appearance: Normal appearance.  HENT:     Head: Normocephalic and atraumatic.     Mouth/Throat:     Mouth: Mucous membranes are moist.  Eyes:     Extraocular Movements: Extraocular movements intact.  Cardiovascular:     Rate and Rhythm: Normal rate and regular rhythm.     Heart sounds: Normal heart sounds.  Pulmonary:     Effort: Pulmonary effort is normal. No respiratory distress.     Breath sounds: Normal breath sounds. No  wheezing.  Abdominal:     General: Bowel sounds are normal. There is no distension.     Palpations: Abdomen is soft.     Tenderness: There is no abdominal tenderness.  Musculoskeletal:     Cervical back: Normal range of motion and neck supple.     Comments: L BKA noted.  Right BKA noted as well with surgical boot in place and VAC drain  Skin:    General: Skin is warm and dry.  Neurological:     General: No focal deficit present.     Mental Status: He is alert.   Psychiatric:        Mood and Affect: Mood normal.        Behavior: Behavior normal.     Consultants:  Vascular surgery ID  Procedures:  Right BKA, 10/20/21  Data Reviewed: I have personally reviewed labs and imaging studies    LOS: 7 days  Time spent: Greater than 50% of the 35 minute visit was spent in counseling/coordination of care for the patient as laid out in the A&P.   Dwyane Dee, MD [facility] 10/20/2021, 2:31 PM

## 2021-10-20 NOTE — Op Note (Signed)
DATE OF SERVICE: 10/20/2021  PATIENT:  Roger Vazquez  71 y.o. male  PRE-OPERATIVE DIAGNOSIS:  non-salvageable right below knee amputation  POST-OPERATIVE DIAGNOSIS:  Same  PROCEDURE:   Right below knee amputation  SURGEON:  Surgeon(s) and Role:    * Cherre Robins, MD - Primary  ASSISTANT: Arlee Muslim, PA-C  An assistant was required to facilitate exposure and expedite the case.  ANESTHESIA:   regional and MAC  EBL: 64m  BLOOD ADMINISTERED:none  DRAINS: none   LOCAL MEDICATIONS USED:  NONE  SPECIMEN:  residual right leg  COUNTS: confirmed correct.  TOURNIQUET:    Total Tourniquet Time Documented: Thigh (Right) - 26 minutes Total: Thigh (Right) - 26 minutes   PATIENT DISPOSITION:  PACU - hemodynamically stable.   Delay start of Pharmacological VTE agent (>24hrs) due to surgical blood loss or risk of bleeding: no  INDICATION FOR PROCEDURE: Roger MARKEYis a 71y.o. male with ischemic, nonviable right lower extremity with no options for revascularization. After careful discussion of risks, benefits, and alternatives the patient was offered right below-knee amputation. The patient understood and wished to proceed.  OPERATIVE FINDINGS: Titanium tibial rod required diamond tipped cutter and bur to complete the tibia anatomy.  Otherwise unremarkable below-knee amputation  DESCRIPTION OF PROCEDURE: After identification of the patient in the pre-operative holding area, the patient was transferred to the operating room. The patient was positioned supine on the operating room table. Anesthesia was induced. The right leg was prepped and draped in standard fashion. A surgical pause was performed confirming correct patient, procedure, and operative location.  A tourniquet was placed on the right thigh. The skin of the leg was marked to plan the anterior flap 10 cm distal to the tibial tuberosity. The anterior flap measured two thirds the circumference of the calf. The  posterior flap was measured to be one third of the circumference of the calf in length.   The leg was exsanguinated with an Esmarch tourniquet. The pneumatic tourniquet was inflated to 250 mm Hg. The flaps were created using pre-made marks using a 21 blade. The incision was carried down through subcutaneous tissue, fascia, and muscle anteriorly. The periosteal tissue was elevated anteriorly so that the tibia was about 3 cm shorter than the anterior skin flap.    I try to cut the tibia with a Gigli saw.  This failed.  He had a tibial rod which required a diamond type cutter.  This was brought onto the field and used without difficulty.  Ultimately the tibia was transected with a power saw. I smoothed out the rough edges with a rasp.  In a similar fashion, I cut back the fibula about two centimeters higher than the level of the tibia with an angled bone cutter.  The posterior flap was completed with an amputation knife.  The specimen was passed off the field. All visible arteries and veins were clamped and ligated using silk suture.  The tourniquet was deflated.  Hemostasis was achieved in the flaps using electrocautery and silk suture.  The stump was washed with sterile normal saline and no further active bleeding was noted.    I reapproximated the anterior and posterior fascia  with interrupted stitches of 2-0 Vicryl.  This was completed along the entire length of anterior and posterior fascia until there was no more loose space in the fascial line.  The skin was then reapproximated with staples.  The stump was washed off and dried.  A Prevena VAC  system was applied to the stump.  This was secured with derma tack and Ioban.  A good seal was achieved.  A stump shrinker was applied.  A stump protector was applied.  Upon completion of the case instrument and sharps counts were confirmed correct. The patient was transferred to the PACU in good condition. I was present for all portions of the procedure.  Upon  completion of the case instrument and sharps counts were confirmed correct. The patient was transferred to the PACU in good condition. I was present for all portions of the procedure.  [name]. Stanford Breed, MD [facility] Phone Number: [phone] 10/20/2021 2:28 PM

## 2021-10-21 DIAGNOSIS — D649 Anemia, unspecified: Secondary | ICD-10-CM | POA: Diagnosis not present

## 2021-10-21 DIAGNOSIS — K253 Acute gastric ulcer without hemorrhage or perforation: Secondary | ICD-10-CM

## 2021-10-21 DIAGNOSIS — I825Y9 Chronic embolism and thrombosis of unspecified deep veins of unspecified proximal lower extremity: Secondary | ICD-10-CM | POA: Diagnosis not present

## 2021-10-21 DIAGNOSIS — M86171 Other acute osteomyelitis, right ankle and foot: Secondary | ICD-10-CM | POA: Diagnosis not present

## 2021-10-21 LAB — CBC WITH DIFFERENTIAL/PLATELET
Abs Immature Granulocytes: 0.12 10*3/uL — ABNORMAL HIGH (ref 0.00–0.07)
Basophils Absolute: 0 10*3/uL (ref 0.0–0.1)
Basophils Relative: 0 %
Eosinophils Absolute: 0.2 10*3/uL (ref 0.0–0.5)
Eosinophils Relative: 1 %
HCT: 23.3 % — ABNORMAL LOW (ref 39.0–52.0)
Hemoglobin: 7.7 g/dL — ABNORMAL LOW (ref 13.0–17.0)
Immature Granulocytes: 1 %
Lymphocytes Relative: 9 %
Lymphs Abs: 1.2 10*3/uL (ref 0.7–4.0)
MCH: 31.6 pg (ref 26.0–34.0)
MCHC: 33 g/dL (ref 30.0–36.0)
MCV: 95.5 fL (ref 80.0–100.0)
Monocytes Absolute: 1.3 10*3/uL — ABNORMAL HIGH (ref 0.1–1.0)
Monocytes Relative: 10 %
Neutro Abs: 10.8 10*3/uL — ABNORMAL HIGH (ref 1.7–7.7)
Neutrophils Relative %: 79 %
Platelets: 211 10*3/uL (ref 150–400)
RBC: 2.44 MIL/uL — ABNORMAL LOW (ref 4.22–5.81)
RDW: 19.7 % — ABNORMAL HIGH (ref 11.5–15.5)
WBC: 13.5 10*3/uL — ABNORMAL HIGH (ref 4.0–10.5)
nRBC: 0 % (ref 0.0–0.2)

## 2021-10-21 LAB — BASIC METABOLIC PANEL
Anion gap: 11 (ref 5–15)
BUN: 18 mg/dL (ref 8–23)
CO2: 25 mmol/L (ref 22–32)
Calcium: 9 mg/dL (ref 8.9–10.3)
Chloride: 96 mmol/L — ABNORMAL LOW (ref 98–111)
Creatinine, Ser: 4.44 mg/dL — ABNORMAL HIGH (ref 0.61–1.24)
GFR, Estimated: 13 mL/min — ABNORMAL LOW (ref >60)
Glucose, Bld: 202 mg/dL — ABNORMAL HIGH (ref 70–99)
Potassium: 4.2 mmol/L (ref 3.5–5.1)
Sodium: 132 mmol/L — ABNORMAL LOW (ref 135–145)

## 2021-10-21 LAB — HEPARIN LEVEL (UNFRACTIONATED)
Heparin Unfractionated: 0.24 IU/mL — ABNORMAL LOW (ref 0.30–0.70)
Heparin Unfractionated: <0.1 IU/mL — ABNORMAL LOW (ref 0.30–0.70)

## 2021-10-21 LAB — GLUCOSE, CAPILLARY
Glucose-Capillary: 195 mg/dL — ABNORMAL HIGH (ref 70–99)
Glucose-Capillary: 198 mg/dL — ABNORMAL HIGH (ref 70–99)
Glucose-Capillary: 138 mg/dL — ABNORMAL HIGH (ref 70–99)
Glucose-Capillary: 159 mg/dL — ABNORMAL HIGH (ref 70–99)

## 2021-10-21 LAB — MAGNESIUM: Magnesium: 2.1 mg/dL (ref 1.7–2.4)

## 2021-10-21 MED ORDER — LABETALOL HCL 5 MG/ML IV SOLN: 10.0000 mg | INTRAVENOUS | Status: DC | PRN

## 2021-10-21 NOTE — Progress Notes (Signed)
°  Progress Note    10/21/2021 10:50 AM 1 Day Post-Op Rt BKA  Subjective:  Moderate pain at his amputation site   Vitals:   10/21/21 0536 10/21/21 0945  BP: (!) 165/66 (!) 163/65  Pulse: 96 96  Resp: 19 17  Temp: 98.6 F (37 C) 98.3 F (36.8 C)  SpO2: 98% 100%   Physical Exam: Lungs:  non labored Extremities: Right BKA VAC to suction, no leak, wearing brace Neurologic: A&O  CBC    Component Value Date/Time   WBC 13.5 (H) 10/21/2021 0457   RBC 2.44 (L) 10/21/2021 0457   HGB 7.7 (L) 10/21/2021 0457   HCT 23.3 (L) 10/21/2021 0457   PLT 211 10/21/2021 0457   MCV 95.5 10/21/2021 0457   MCH 31.6 10/21/2021 0457   MCHC 33.0 10/21/2021 0457   RDW 19.7 (H) 10/21/2021 0457   LYMPHSABS 1.2 10/21/2021 0457   MONOABS 1.3 (H) 10/21/2021 0457   EOSABS 0.2 10/21/2021 0457   BASOSABS 0.0 10/21/2021 0457    BMET    Component Value Date/Time   NA 132 (L) 10/21/2021 0457   K 4.2 10/21/2021 0457   CL 96 (L) 10/21/2021 0457   CO2 25 10/21/2021 0457   GLUCOSE 202 (H) 10/21/2021 0457   BUN 18 10/21/2021 0457   CREATININE 4.44 (H) 10/21/2021 0457   CALCIUM 9.0 10/21/2021 0457   CALCIUM 7.1 (L) 08/01/2018 0944   GFRNONAA 13 (L) 10/21/2021 0457   GFRAA 5 (L) 08/11/2018 0858    INR    Component Value Date/Time   INR 1.1 10/13/2021 1300     Intake/Output Summary (Last 24 hours) at 10/21/2021 1050 Last data filed at 10/21/2021 0800 Gross per 24 hour  Intake 600 ml  Output 1575 ml  Net -975 ml      Assessment/Plan:  71 y.o. male with R heel osteomyelitis.  Status post right below-knee amputation. He is doing well on exam. PT/ OT, multimodal pain control.  Appreciate assistance from primary team.   Roger John MD [facility] Phone Number: [phone] 10/21/2021 10:50 AM

## 2021-10-21 NOTE — Progress Notes (Addendum)
[facility] Progress Note   Subjective:   Patient seen and examined at bedside.  Admits to pain in stump but does not rate.  Appetite decreased.  Denies CP, SOB, abdominal pain, and n/v/d.  Tolerated dialysis well yesterday.   Objective Vitals:   10/20/21 2323 10/21/21 0200 10/21/21 0536 10/21/21 0945  BP: (!) 158/62 (!) 163/66 (!) 165/66 (!) 163/65  Pulse: 97 96 96 96  Resp: 18  19 17   Temp: (!) 100.9 F (38.3 C) 98.1 F (36.7 C) 98.6 F (37 C) 98.3 F (36.8 C)  TempSrc: Oral Oral    SpO2: 98% 96% 98% 100%  Weight:      Height:       Physical Exam General:chronically ill appearing male in NAD Heart:RRR, no mrg Lungs:CTAB, nml WOB on RA Abdomen:soft, NTND Extremities:no LE edema, b/l BKA Dialysis Access: L IJ TDC, R AVG unuseable   Filed Weights   10/19/21 0600 10/20/21 1441 10/20/21 1805  Weight: 50.7 kg 57.3 kg 55.5 kg    Intake/Output Summary (Last 24 hours) at 10/21/2021 1338 Last data filed at 10/21/2021 0800 Gross per 24 hour  Intake 200 ml  Output 1500 ml  Net -1300 ml    Additional Objective Labs: Basic Metabolic Panel: Recent Labs  Lab 10/17/21 0221 10/18/21 0320 10/19/21 0718 10/20/21 1022 10/20/21 1941 10/21/21 0457  NA 135   < > 135 134* 133* 132*  K 4.2   < > 3.6 4.4 4.2 4.2  CL 100   < > 99 98 97* 96*  CO2 22   < > 26  --  26 25  GLUCOSE 154*   < > 139* 154* 171* 202*  BUN 22   < > 13 37* 11 18  CREATININE 4.71*   < > 4.45* 6.70* 3.39* 4.44*  CALCIUM 8.9   < > 8.8*  --  8.7* 9.0  PHOS 1.6*  --   --   --   --   --    < > = values in this interval not displayed.   Liver Function Tests: Recent Labs  Lab 10/17/21 0221  ALBUMIN 3.0*    CBC: Recent Labs  Lab 10/17/21 0221 10/18/21 0320 10/19/21 0718 10/20/21 1022 10/20/21 1941 10/21/21 0457  WBC 6.7 6.7 6.5  --  11.2* 13.5*  NEUTROABS  --   --  3.9  --  8.5* 10.8*  HGB 8.4* 7.6* 7.5* 9.9* 8.3* 7.7*  HCT 25.1* 22.8* 23.4* 29.0* 25.8* 23.3*  MCV 94.4 94.2 95.1  --   95.9 95.5  PLT 212 220 213  --  206 211   CBG: Recent Labs  Lab 10/20/21 1837 10/20/21 2322 10/21/21 0633 10/21/21 0702 10/21/21 1137  GLUCAP 136* 192* 198* 195* 138*    Medications:  sodium chloride 10 mL/hr (10/17/21 1018)   heparin 1,300 Units/hr (10/21/21 0645)    amLODipine  10 mg Oral Daily   calcium acetate  667 mg Oral TID WC   Chlorhexidine Gluconate Cloth  6 each Topical Q0600   darbepoetin (ARANESP) injection - DIALYSIS  200 mcg Intravenous Q Fri-HD   feeding supplement (NEPRO CARB STEADY)  237 mL Oral TID BM   ferrous sulfate  325 mg Oral Q breakfast   insulin aspart  0-6 Units Subcutaneous TID WC   multivitamin  1 tablet Oral QHS   pantoprazole  40 mg Oral BID   polyethylene glycol  17 g Oral BID   senna  1 tablet Oral Daily  sodium chloride flush  3 mL Intravenous Q12H    Dialysis Orders: [facility]  on MWF . 180NRe, 3 hr 15 min, BFR 400, DFR A1.5, EDW 54kg, 2K 2Ca, TDC AVG with suture intact. no heparin Mircera 264mcg IV q HD- last dose was 187mcg on 10/19 Calcium acetate 667mg  1 tab TID with meals Tums 200mg - 2 tabs PO BID   Assessment/Plan: Symptomatic anemia: GIB suspected, GI following. Pt currently on PPI, Eliquis on hold.  Endoscopy on 10/17/21 with 1 non-bleeding superficial gastric ulcer w/no stigmata of bleeding in the prepyloric region, exam otherwise normal. S/P 1 unit PRBCs 10/13/2021. Hgb today 7.7. Follow labs. Aranesp ^ 23mcg qwk to due today. R heel ulceration w/Osteomyelitis. Seen by ID. No ABX. 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 Stanford Breed.   ESRD:  Dialysis on MWF schedule. Next HD on 10/23/21.   Hypertension/volume: BP elevated this AM. Continue home meds - amlodipine 10mg . Under dry weight with additional weight loss following surgery.  Will need lower EDW on DC.  UF as tolerated.   Metabolic bone disease: Corrected calcium in high 9s.  Last phos low.  Hold binders for now and trend labs, can add back once appetite improved.  Nutrition:  Renal diet. Low  albumin in setting of Osteo. +protein supplements Malfunctioning AVG: Went to [facility] for [facility] placement and shuntogram earlier in December 2022. Needs referral to [facility] for new access when he is stable.     Jen Mow, PA-C [facility] 10/21/2021,1:38 PM  LOS: 8 days    Seen and examined independently.  Agree with note and exam as documented above by physician extender and as noted here.  He is not conversant but does answer questions.  He is a little frustrated with questions and wants to rest.   General adult male in bed in no acute distress HEENT normocephalic atraumatic extraocular movements intact sclera anicteric Neck supple trachea midline Lungs clear to auscultation bilaterally normal work of breathing at rest  Heart S1S2 no rub Abdomen soft nontender nondistended Extremities right BKA; no edema residual limb appreciated and no edema LLE Psych depressed mood and affect Access LIJ tunn catheter    Anemia - s/p EGD  One non-bleeding superficial gastric ulcer with no stigmata of bleeding. On aranesp 200 mcg every week. 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

## 2021-10-21 NOTE — Progress Notes (Addendum)
Orthopedic Tech Progress Note Patient Details:  [name] May 28, 1951 [id number]  I contacted [facility] at 0957 requesting follow up care from the on-call clinician regarding the pt's right AmpuShield. Per Jeani Hawking PT, the elastic band is missing, causing the limb protector to be ineffective. Ross, the on-call clinician for [facility] will be stopping by today to see if he has any solutions before needing to order an entire new brace.  Patient ID: Roger Vazquez, male   DOB: 12-Mar-1951, 71 y.o.   MRN: [id number]  Carin Primrose 10/21/2021, 10:04 AM

## 2021-10-21 NOTE — Evaluation (Addendum)
Physical Therapy Evaluation Patient Details Name: Roger Vazquez MRN: [id number] DOB: May 05, 1951 Today's Date: 10/21/2021  History of Present Illness  71 y.o. male presented to the ED 10/12/21 with symptomatic anemia (HGB 6.4). EGD/colonoscopy 1/3. 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 PMH significant of ESRD on HD MWF, hypertension, non-insulin-dependent type 2 diabetes, history of lung cancer, PAD, polysubstance abuse, left BKA, Hep C, covid 11/22, DVT 10/22  Clinical Impression   Pt admitted secondary to problem above with deficits below. Patient is an unreliable historian and provided differing answers when asked about prior functional status. Noted per chart pt is from a facility (?SNF). Anticipate he was lifted OOB to chair for trips to dialysis and transported in accessible [place]. (This was one of the scenarios pt reported).  Pt currently requires total assist of 2 for any OOB transfers (with use of lift recommended). Wheelchair mobility will need to be further assessed. Patient participatory with therapy and has had a decline in at least sitting balance and possibly with transfers.  Anticipate patient will benefit from PT to address problems listed below.Will continue to follow acutely to maximize functional mobility independence and safety.          Recommendations for follow up therapy are one component of a multi-disciplinary discharge planning process, led by the attending physician.  Recommendations may be updated based on patient status, additional functional criteria and insurance authorization.  Follow Up Recommendations Skilled nursing-short term rehab (<3 hours/day)    Assistance Recommended at Discharge Frequent or constant Supervision/Assistance  Patient can return home with the following  Two people to help with walking and/or transfers;Assistance with cooking/housework;Assistance with feeding;Direct supervision/assist for medications  management;Direct supervision/assist for financial management;Assist for transportation;Help with stairs or ramp for entrance    Equipment Recommendations Wheelchair (measurements PT);Wheelchair cushion (measurements PT)  Recommendations for Other Services  OT consult    Functional Status Assessment Patient has had a recent decline in their functional status and demonstrates the ability to make significant improvements in function in a reasonable and predictable amount of time.     Precautions / Restrictions Precautions Precautions: Fall Required Braces or Orthoses: Other Brace Other Brace: rt BKA limb guard Restrictions Weight Bearing Restrictions: Yes      Mobility  Bed Mobility Overal bed mobility: Needs Assistance Bed Mobility: Rolling;Sidelying to Sit;Sit to Sidelying Rolling: Supervision (with rail) Sidelying to sit: Mod assist (with rail)     Sit to sidelying: Mod assist (with rail) General bed mobility comments: pt with slow response to commands, but able to assist with all mobility    Transfers Overall transfer level: Needs assistance Equipment used: None Transfers: Bed to chair/wheelchair/BSC            Lateral/Scoot Transfers: Mod assist General transfer comment: lateral scoot with use of bed pad along EOB toward HOB x 3 reps    Ambulation/Gait                  Stairs            Wheelchair Mobility    Modified Rankin (Stroke Patients Only)       Balance Overall balance assessment: Needs assistance Sitting-balance support: Bilateral upper extremity supported;Feet unsupported Sitting balance-Leahy Scale: Poor Sitting balance - Comments: slight posterior bias, but able to maintain with bil UE support while scooting laterally  Pertinent Vitals/Pain Pain Assessment: Faces Faces Pain Scale: Hurts little more Pain Location: Rt BKA Pain Descriptors / Indicators:  Discomfort;Moaning Pain Intervention(s): Limited activity within patient's tolerance;Monitored during session;Repositioned    Home Living Family/patient expects to be discharged to:: Skilled nursing facility                   Additional Comments: Pt from SNF per chart    Prior Function Prior Level of Function : Patient poor historian/Family not available             Mobility Comments: pt agrees when asked if he is lifted to wheelchair to go to dialysis; unsure if this is accurate; no prosthetic limb in room, however nurse tech reports he was asking to put his leg on earlier       Hand Dominance   Dominant Hand: Right    Extremity/Trunk Assessment   Upper Extremity Assessment Upper Extremity Assessment: Defer to OT evaluation    Lower Extremity Assessment Lower Extremity Assessment: RLE deficits/detail;LLE deficits/detail;Generalized weakness RLE Deficits / Details: Wearing limb guard on arrival with approx 30 degrees knee flexion and lower strap of limb guard missing. Removed and assessed ROM/strength with 0 knee extension to 45 knee flexion; hip/knee grossly 4/5 (limited by pain) LLE Deficits / Details: 0 knee extension to 80 knee flexion with pt reporting pain in knee extensors with increasing knee flexion; hip/knee 4/5 strength    Cervical / Trunk Assessment Cervical / Trunk Assessment: Normal  Communication   Communication: No difficulties  Cognition Arousal/Alertness: Awake/alert Behavior During Therapy: Flat affect Overall Cognitive Status: No family/caregiver present to determine baseline cognitive functioning                                 General Comments: initially stating he lives at home with a roommate; then agrees he was living in SNF; oriented to self and DOB        General Comments General comments (skin integrity, edema, etc.): Called ortho tech re: missing strap on limb guard. These braces come from outside vendor and ortho  tech not sure if we can get replaced. Recommended I enter outside vendor brace order with clarification re: need for distal strap.    Exercises     Assessment/Plan    PT Assessment Patient needs continued PT services  PT Problem List Decreased strength;Decreased range of motion;Decreased balance;Decreased mobility;Decreased cognition;Decreased knowledge of use of DME;Decreased safety awareness;Decreased knowledge of precautions;Decreased skin integrity;Pain       PT Treatment Interventions DME instruction;Functional mobility training;Therapeutic activities;Therapeutic exercise;Balance training;Patient/family education;Cognitive remediation;Wheelchair mobility training    PT Goals (Current goals can be found in the Care Plan section)  Acute Rehab PT Goals Patient Stated Goal: unable to state; agrees with goal to "move more" PT Goal Formulation: With patient Time For Goal Achievement: 11/04/21 Potential to Achieve Goals: Good    Frequency Min 2X/week     Co-evaluation               AM-PAC PT "6 Clicks" Mobility  Outcome Measure Help needed turning from your back to your side while in a flat bed without using bedrails?: A Little Help needed moving from lying on your back to sitting on the side of a flat bed without using bedrails?: A Lot Help needed moving to and from a bed to a chair (including a wheelchair)?: Total Help needed standing up from a chair using your  arms (e.g., wheelchair or bedside chair)?: Total Help needed to walk in hospital room?: Total Help needed climbing 3-5 steps with a railing? : Total 6 Click Score: 9    End of Session   Activity Tolerance: Patient tolerated treatment well Patient left: in bed;with call bell/phone within reach;with bed alarm set Nurse Communication: Mobility status;Other (comment) (wants assist with breakfast; put in order for strap for limb guard) PT Visit Diagnosis: Muscle weakness (generalized) (M62.81);Difficulty in walking,  not elsewhere classified (R26.2);Other (comment) (limited mobility due to amputation)    Time: 3474-2595 PT Time Calculation (min) (ACUTE ONLY): 21 min   Charges:   PT Evaluation $PT Eval Moderate Complexity: [place], PT Acute Rehabilitation Services  Pager [phone] Office [phone]   Rexanne Mano 10/21/2021, 10:07 AM

## 2021-10-21 NOTE — Plan of Care (Signed)
°  Problem: Health Behavior/Discharge Planning: Goal: Ability to manage health-related needs will improve 10/21/2021 1852 by Sheppard Penton, RN Outcome: Not Progressing 10/21/2021 1852 by Sheppard Penton, RN Outcome: Not Progressing

## 2021-10-21 NOTE — Evaluation (Signed)
Occupational Therapy Evaluation Patient Details Name: Roger Vazquez MRN: [id number] DOB: 05-10-1951 Today's Date: 10/21/2021   History of Present Illness 71 y.o. male presented to the ED 10/12/21 with symptomatic anemia (HGB 6.4). EGD/colonoscopy 1/3. 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 PMH significant of ESRD on HD MWF, hypertension, non-insulin-dependent type 2 diabetes, history of lung cancer, PAD, polysubstance abuse, left BKA, Hep C, covid 11/22, DVT 10/22   Clinical Impression   Pt admitted for concerns listed above. PTA pt chart reports that he lives at [facility] in [place]. Pt at this time is having difficulty following commands and answering questions.  Requiring mod A for bed mobility and pt was unable to complete further mobility due to increased pain in RLE, requesting to return to supine. Recommending return to SNF level therapies to address sitting balance and ADL performance. OT will follow acutely.      Recommendations for follow up therapy are one component of a multi-disciplinary discharge planning process, led by the attending physician.  Recommendations may be updated based on patient status, additional functional criteria and insurance authorization.   Follow Up Recommendations  Skilled nursing-short term rehab (<3 hours/day)    Assistance Recommended at Discharge Frequent or constant Supervision/Assistance  Patient can return home with the following Two people to help with bathing/dressing/bathroom;Assistance with cooking/housework;Direct supervision/assist for medications management;Direct supervision/assist for financial management;Assist for transportation;Two people to help with walking and/or transfers    Functional Status Assessment  Patient has had a recent decline in their functional status and/or demonstrates limited ability to make significant improvements in function in a reasonable and predictable amount of time   Equipment Recommendations  None recommended by OT    Recommendations for Other Services       Precautions / Restrictions Precautions Precautions: Fall Required Braces or Orthoses: Other Brace Other Brace: rt BKA limb guard Restrictions Weight Bearing Restrictions: Yes      Mobility Bed Mobility Overal bed mobility: Needs Assistance Bed Mobility: Rolling;Sidelying to Sit;Sit to Sidelying Rolling: Supervision (with rail) Sidelying to sit: Mod assist (with rail)     Sit to sidelying: Mod assist (with rail) General bed mobility comments: pt with slow response to commands, but able to assist with all mobility    Transfers                   General transfer comment: Deffered due to increased pain with sitting up      Balance Overall balance assessment: Needs assistance Sitting-balance support: Bilateral upper extremity supported;Feet unsupported Sitting balance-Leahy Scale: Poor Sitting balance - Comments: slight posterior bias, but able to maintain with bil UE support while scooting laterally                                   ADL either performed or assessed with clinical judgement   ADL Overall ADL's : Needs assistance/impaired                                       General ADL Comments: Max-total A +2 for all ADL's and functional mobiltiy     Vision Baseline Vision/History: 1 Wears glasses Ability to See in Adequate Light: 0 Adequate Patient Visual Report: No change from baseline Vision Assessment?: No apparent visual deficits     Perception  Praxis      Pertinent Vitals/Pain Pain Assessment: Faces Faces Pain Scale: Hurts little more Pain Location: Rt BKA Pain Descriptors / Indicators: Discomfort;Moaning Pain Intervention(s): Limited activity within patient's tolerance;Monitored during session;Repositioned     Hand Dominance Right   Extremity/Trunk Assessment Upper Extremity Assessment Upper Extremity  Assessment: Generalized weakness   Lower Extremity Assessment Lower Extremity Assessment: Defer to PT evaluation   Cervical / Trunk Assessment Cervical / Trunk Assessment: Normal   Communication Communication Communication: No difficulties   Cognition Arousal/Alertness: Awake/alert Behavior During Therapy: Flat affect Overall Cognitive Status: No family/caregiver present to determine baseline cognitive functioning                                 General Comments: oriented to self and DOB, repeats questions and statements when asked.     General Comments       Exercises     Shoulder Instructions      Home Living Family/patient expects to be discharged to:: Skilled nursing facility                                 Additional Comments: Pt from SNF per chart      Prior Functioning/Environment Prior Level of Function : Patient poor historian/Family not available                        OT Problem List: Decreased strength;Decreased range of motion;Decreased activity tolerance;Impaired balance (sitting and/or standing);Decreased cognition;Decreased safety awareness;Impaired sensation;Impaired tone;Pain      OT Treatment/Interventions: Self-care/ADL training;Therapeutic exercise;Energy conservation;DME and/or AE instruction;Therapeutic activities;Patient/family education;Balance training    OT Goals(Current goals can be found in the care plan section) Acute Rehab OT Goals Patient Stated Goal: None stated OT Goal Formulation: With patient Time For Goal Achievement: 11/04/21 Potential to Achieve Goals: Fair ADL Goals Pt Will Transfer to Toilet: anterior/posterior transfer;bedside commode;with mod assist Additional ADL Goal #1: Pt will complete bed mobility with min guard as a precursor for seated ADL's Additional ADL Goal #2: Pt will complete lateral scoot transfers with min A.  OT Frequency: Min 2X/week    Co-evaluation               AM-PAC OT "6 Clicks" Daily Activity     Outcome Measure Help from another person eating meals?: A Little Help from another person taking care of personal grooming?: A Little Help from another person toileting, which includes using toliet, bedpan, or urinal?: A Lot Help from another person bathing (including washing, rinsing, drying)?: A Lot Help from another person to put on and taking off regular upper body clothing?: A Lot Help from another person to put on and taking off regular lower body clothing?: A Lot 6 Click Score: 14   End of Session Nurse Communication: Mobility status  Activity Tolerance: Patient limited by pain Patient left: in bed;with call bell/phone within reach;with bed alarm set  OT Visit Diagnosis: Other abnormalities of gait and mobility (R26.89);Muscle weakness (generalized) (M62.81);Pain Pain - Right/Left: Right Pain - part of body: Leg                Time: 1450-1504 OT Time Calculation (min): 14 min Charges:  OT General Charges $OT Visit: 1 Visit OT Evaluation $OT Eval Moderate Complexity: 1 Mod  Dantre Yearwood H., OTR/L Acute Rehabilitation  Catricia Scheerer Elane Yolanda Bonine 10/21/2021, 5:51  PM

## 2021-10-21 NOTE — Plan of Care (Signed)
°  Problem: Education: Goal: Knowledge of General Education information will improve Description: Including pain rating scale, medication(s)/side effects and non-pharmacologic comfort measures Outcome: Progressing   Problem: Health Behavior/Discharge Planning: Goal: Ability to manage health-related needs will improve Outcome: Progressing   Problem: Clinical Measurements: Goal: Ability to maintain clinical measurements within normal limits will improve Outcome: Progressing Goal: Will remain free from infection Outcome: Progressing Goal: Diagnostic test results will improve Outcome: Progressing Goal: Respiratory complications will improve Outcome: Progressing Goal: Cardiovascular complication will be avoided Outcome: Progressing   Problem: Activity: Goal: Risk for activity intolerance will decrease Outcome: Progressing   Problem: Nutrition: Goal: Adequate nutrition will be maintained Outcome: Progressing   Problem: Coping: Goal: Level of anxiety will decrease Outcome: Progressing   Problem: Elimination: Goal: Will not experience complications related to bowel motility Outcome: Progressing Goal: Will not experience complications related to urinary retention Outcome: Progressing   Problem: Pain Managment: Goal: General experience of comfort will improve Outcome: Not Progressing   Problem: Safety: Goal: Ability to remain free from injury will improve Outcome: Progressing

## 2021-10-21 NOTE — Progress Notes (Signed)
ANTICOAGULATION CONSULT NOTE - Follow Up Consult  Pharmacy Consult for heparin  Indication: DVT  Allergies  Allergen Reactions   Sildenafil Other (See Comments)    Pt stated, "Makes my Blood Pressure drop"    Patient Measurements: Height: 5\' 6"  (167.6 cm) Weight: 55.5 kg (122 lb 5.7 oz) IBW/kg (Calculated) : 63.8   Vital Signs: Temp: 98.6 F (37 C) (01/07 0536) Temp Source: Oral (01/07 0200) BP: 165/66 (01/07 0536) Pulse Rate: 96 (01/07 0536)  Labs: Recent Labs    10/18/21 1227 10/18/21 2238 10/19/21 0718 10/19/21 0718 10/20/21 1022 10/20/21 1941 10/21/21 0457  HGB  --   --  7.5*   < > 9.9* 8.3* 7.7*  HCT  --   --  23.4*   < > 29.0* 25.8* 23.3*  PLT  --   --  213  --   --  206 211  APTT 64* 73*  --   --   --   --   --   HEPARINUNFRC 0.25* 0.17* 0.37  --   --   --  0.24*  CREATININE  --   --  4.45*  --  6.70* 3.39*  --    < > = values in this interval not displayed.     Estimated Creatinine Clearance: 15.7 mL/min (A) (by C-G formula based on SCr of 3.39 mg/dL (H)).   Medical History: Past Medical History:  Diagnosis Date   Anemia    Blood dyscrasia    history of coagulation deficit per [facility] nursing home   Cancer [facility])    Lung   Diabetes mellitus    Hepatitis    Hypertension    Renal disorder    MWF- [facility]    Medications:  Medications Prior to Admission  Medication Sig Dispense Refill Last Dose   acetaminophen (TYLENOL) 325 MG tablet Take 650 mg by mouth every 6 (six) hours.   Past Week   amLODipine (NORVASC) 10 MG tablet Take 1 tablet (10 mg total) by mouth daily.   10/12/2021   apixaban (ELIQUIS) 5 MG TABS tablet Take 1 tablet (5 mg total) by mouth 2 (two) times daily. 60 tablet  10/12/2021 at 9am   aspirin 81 MG chewable tablet Chew 81 mg by mouth daily. (0800)   10/12/2021   calcium acetate (PHOSLO) 667 MG capsule Take 667 mg by mouth 3 (three) times daily with meals. (0800, 1300 & 1700)   10/12/2021   ferrous sulfate 325  (65 FE) MG tablet Take 325 mg by mouth daily with breakfast.   10/13/2021   hydrocortisone (ANUSOL-HC) 25 MG suppository Place 25 mg rectally 2 (two) times daily as needed for hemorrhoids or anal itching.   unk   lidocaine-prilocaine (EMLA) cream Apply 1 application topically See admin instructions. As needed on dialysis days.Laurine Blazer and Friday)   Past Week   Multiple Vitamin (MULITIVITAMIN WITH MINERALS) TABS Take 1 tablet by mouth daily. 30 tablet 0 10/12/2021   Nutritional Supplements (NOVASOURCE RENAL PO) Take 237 mLs by mouth daily.   Past Week   oxyCODONE (OXY IR/ROXICODONE) 5 MG immediate release tablet Take 5 mg by mouth 2 (two) times daily as needed for severe pain.   10/12/2021   polyethylene glycol (MIRALAX / GLYCOLAX) 17 g packet Take 17 g by mouth 2 (two) times daily. 14 each 0 Past Week   senna (SENOKOT) 8.6 MG TABS tablet Take 1 tablet (8.6 mg total) by mouth daily. 7 tablet 0 10/12/2021   apixaban (ELIQUIS)  5 MG TABS tablet Take 2 tablets (10 mg total) by mouth 2 (two) times daily for 7 doses. (Patient not taking: Reported on 10/13/2021) 60 tablet  Not Taking   Scheduled:   amLODipine  10 mg Oral Daily   calcium acetate  667 mg Oral TID WC   Chlorhexidine Gluconate Cloth  6 each Topical Q0600   darbepoetin (ARANESP) injection - DIALYSIS  200 mcg Intravenous Q Fri-HD   feeding supplement (NEPRO CARB STEADY)  237 mL Oral TID BM   ferrous sulfate  325 mg Oral Q breakfast   insulin aspart  0-6 Units Subcutaneous TID WC   multivitamin  1 tablet Oral QHS   pantoprazole  40 mg Oral BID   polyethylene glycol  17 g Oral BID   senna  1 tablet Oral Daily   sodium chloride flush  3 mL Intravenous Q12H    Assessment: 71 yo male with recent DVT (07/2021) on apixaban PTA (last dose taken is unclear). He is noted with symptomatic anemia with plans for endoscopy/colonoscopy on 1/3. Pharmacy consulted to dose heparin. He is noted with ESRD  Pt is s/p EGD and colonoscopy. Heparin  was held for the BKA today. D/w Dr. Stanford Breed and heparin was resumed 6 hr post procedure. We will resume without bolus. We will target for a lower therapeutic range.   Currently on IV heparin at 1200 units/hr. Heparin level this AM is subtherapeutic. H/H continues to trend down. Plt wnl. No s/s of over bleeding noted per RN.   Goal of Therapy:  Heparin level= 0.3-0.5 Monitor platelets by anticoagulation protocol: Yes   Plan:  Increase heparin to 1300 units/hr F/u 8 hr HL  Daily HL/CBC F/u resume apixaban   Albertina Parr, PharmD., BCPS, BCCCP Clinical Pharmacist Please refer to [facility] for unit-specific pharmacist

## 2021-10-21 NOTE — Progress Notes (Signed)
Progress Note    Roger Vazquez   VZD:[id number]  DOB: Feb 28, 1951  DOA: 10/12/2021     8 PCP: [facility]  Initial CC: abnormal labs  Hospital Course: Mr. Hippert is a 71 yo male with PMH ESRD on HD MWF, PAD s/p LBKA, DMII, HTN, anemia of chronic disease who presented with a hemoglobin of 6.4 g/dL from his SNF He was also reported to be feeling weak and tired. He underwent evaluation with GI regarding his anemia with EGD and colonoscopy.  EGD showed small gastric ulcer and he was continued on a PPI.  Colonoscopy showed diverticulosis.  He also had an ongoing wound to his right heel.  He underwent further evaluation with MRI of the right foot which showed osteomyelitis involving the calcaneus.  He was evaluated by vascular surgery recommendations for a right BKA which was performed on 10/20/2021.  Interval History:  No events overnight.  Having pain at amputation site as expected and was a little aggravated this morning.  Pain medication about to be administered when seen. Tolerated dialysis well yesterday after surgery.  Assessment & Plan: * Symptomatic anemia- (present on admission) -Patient with Hgb 12.6 on 11/2 and 6.6 g/dL on admission - s/p EGD (small gastric ulcer) and CLN (diverticulosis) - indefinite PPI daily for gastric ulcer. No repeat CLN needed for surveillance/screening purposes   Acute osteomyelitis of right calcaneus ([place]) -Patient with known heel ulcer, has been followed by vascular -He had failed ABIs recently with inadequate perfusion to promote wound healing - evaluated by vascular surgery with recommendations for RBKA on 10/20/21 - he is wheelchair bound; already had LBKA and does not use a prosthesis -post op PT/OT evals ordered  ESRD on hemodialysis ([place]) -Patient on chronic MWF HD - continue HD inpt per nephrology -Continue Phoslo  Acute gastric ulcer without hemorrhage or perforation - Continue daily PPI indefinitely  DVT (deep venous  thrombosis) ([place]) -Duplex (10/16/21) notes chronic DVT of SF junction -Has been on Eliquis -Hold Eliquis for now; possible transition back to Eliquis after the weekend if hemoglobin remains stable postop - currently on heparin drip   Lymphadenopathy -He is due for lymph node biopsy in the axillary region on 1/3 at the [place]  HTN (hypertension)- (present on admission) -Continue Norvasc -Will cover with prn IV hydralazine  Type 2 diabetes mellitus with chronic kidney disease on chronic dialysis ([place]) -Prior A1c was 7.9% on 04/27/21 - repeat A1c now 6.6% on 10/18/21 - continue SSI and CBG monitoring    Old records reviewed in assessment of this patient  Antimicrobials: Rocephin 10/14/21 x 1  DVT prophylaxis: Heparin drip  Code Status:   Code Status: Full Code  Disposition Plan:  [facility] if able to return  Status is: Inpt  Objective: Blood pressure (!) 163/65, pulse 96, temperature 98.3 F (36.8 C), resp. rate 17, height 5\' 6"  (1.676 m), weight 55.5 kg, SpO2 100 %.  Examination:  Physical Exam Constitutional:      General: He is not in acute distress.    Appearance: Normal appearance.  HENT:     Head: Normocephalic and atraumatic.     Mouth/Throat:     Mouth: Mucous membranes are moist.  Eyes:     Extraocular Movements: Extraocular movements intact.  Cardiovascular:     Rate and Rhythm: Normal rate and regular rhythm.     Heart sounds: Normal heart sounds.  Pulmonary:     Effort: Pulmonary effort is normal. No respiratory distress.  Breath sounds: Normal breath sounds. No wheezing.  Abdominal:     General: Bowel sounds are normal. There is no distension.     Palpations: Abdomen is soft.     Tenderness: There is no abdominal tenderness.  Musculoskeletal:     Cervical back: Normal range of motion and neck supple.     Comments: L BKA noted.  Right BKA noted as well with surgical boot in place and VAC drain  Skin:    General: Skin is warm and dry.  Neurological:      General: No focal deficit present.     Mental Status: He is alert.  Psychiatric:        Mood and Affect: Mood normal.        Behavior: Behavior normal.     Consultants:  Vascular surgery ID Nephrology  Procedures:  Right BKA, 10/20/21  Data Reviewed: I have personally reviewed labs and imaging studies    LOS: 8 days  Time spent: Greater than 50% of the 35 minute visit was spent in counseling/coordination of care for the patient as laid out in the A&P.   Dwyane Dee, MD [facility] 10/21/2021, 2:48 PM

## 2021-10-21 NOTE — Progress Notes (Signed)
ANTICOAGULATION CONSULT NOTE - Follow Up Consult  Pharmacy Consult for heparin  Indication: DVT  Allergies  Allergen Reactions   Sildenafil Other (See Comments)    Pt stated, "Makes my Blood Pressure drop"    Patient Measurements: Height: 5\' 6"  (167.6 cm) Weight: 55.5 kg (122 lb 5.7 oz) IBW/kg (Calculated) : 63.8   Vital Signs: Temp: 98.2 F (36.8 C) (01/07 1628) BP: 154/67 (01/07 1628) Pulse Rate: 101 (01/07 1628)  Labs: Recent Labs    10/18/21 2238 10/18/21 2238 10/19/21 0718 10/20/21 1022 10/20/21 1941 10/21/21 0457 10/21/21 1759  HGB  --    < > 7.5* 9.9* 8.3* 7.7*  --   HCT  --    < > 23.4* 29.0* 25.8* 23.3*  --   PLT  --   --  213  --  206 211  --   APTT 73*  --   --   --   --   --   --   HEPARINUNFRC 0.17*  --  0.37  --   --  0.24* <0.10*  CREATININE  --    < > 4.45* 6.70* 3.39* 4.44*  --    < > = values in this interval not displayed.     Estimated Creatinine Clearance: 12 mL/min (A) (by C-G formula based on SCr of 4.44 mg/dL (H)).   Medical History: Past Medical History:  Diagnosis Date   Anemia    Blood dyscrasia    history of coagulation deficit per [facility] nursing home   Cancer [facility])    Lung   Diabetes mellitus    Hepatitis    Hypertension    Renal disorder    MWF- [facility]    Medications:  Medications Prior to Admission  Medication Sig Dispense Refill Last Dose   acetaminophen (TYLENOL) 325 MG tablet Take 650 mg by mouth every 6 (six) hours.   Past Week   amLODipine (NORVASC) 10 MG tablet Take 1 tablet (10 mg total) by mouth daily.   10/12/2021   apixaban (ELIQUIS) 5 MG TABS tablet Take 1 tablet (5 mg total) by mouth 2 (two) times daily. 60 tablet  10/12/2021 at 9am   aspirin 81 MG chewable tablet Chew 81 mg by mouth daily. (0800)   10/12/2021   calcium acetate (PHOSLO) 667 MG capsule Take 667 mg by mouth 3 (three) times daily with meals. (0800, 1300 & 1700)   10/12/2021   ferrous sulfate 325 (65 FE) MG tablet Take 325  mg by mouth daily with breakfast.   10/13/2021   hydrocortisone (ANUSOL-HC) 25 MG suppository Place 25 mg rectally 2 (two) times daily as needed for hemorrhoids or anal itching.   unk   lidocaine-prilocaine (EMLA) cream Apply 1 application topically See admin instructions. As needed on dialysis days.Laurine Blazer and Friday)   Past Week   Multiple Vitamin (MULITIVITAMIN WITH MINERALS) TABS Take 1 tablet by mouth daily. 30 tablet 0 10/12/2021   Nutritional Supplements (NOVASOURCE RENAL PO) Take 237 mLs by mouth daily.   Past Week   oxyCODONE (OXY IR/ROXICODONE) 5 MG immediate release tablet Take 5 mg by mouth 2 (two) times daily as needed for severe pain.   10/12/2021   polyethylene glycol (MIRALAX / GLYCOLAX) 17 g packet Take 17 g by mouth 2 (two) times daily. 14 each 0 Past Week   senna (SENOKOT) 8.6 MG TABS tablet Take 1 tablet (8.6 mg total) by mouth daily. 7 tablet 0 10/12/2021   apixaban (ELIQUIS) 5 MG TABS tablet  Take 2 tablets (10 mg total) by mouth 2 (two) times daily for 7 doses. (Patient not taking: Reported on 10/13/2021) 60 tablet  Not Taking   Scheduled:   amLODipine  10 mg Oral Daily   calcium acetate  667 mg Oral TID WC   Chlorhexidine Gluconate Cloth  6 each Topical Q0600   darbepoetin (ARANESP) injection - DIALYSIS  200 mcg Intravenous Q Fri-HD   feeding supplement (NEPRO CARB STEADY)  237 mL Oral TID BM   ferrous sulfate  325 mg Oral Q breakfast   insulin aspart  0-6 Units Subcutaneous TID WC   multivitamin  1 tablet Oral QHS   pantoprazole  40 mg Oral BID   polyethylene glycol  17 g Oral BID   senna  1 tablet Oral Daily   sodium chloride flush  3 mL Intravenous Q12H    Assessment: 71 yo male with recent DVT (07/2021) on apixaban PTA (last dose taken is unclear). He is noted with symptomatic anemia with plans for endoscopy/colonoscopy on 1/3. Pharmacy consulted to dose heparin. He is noted with ESRD  Pt is s/p EGD and colonoscopy. Heparin was held for the BKA today.  D/w Dr. Stanford Breed and heparin was resumed 6 hr post procedure. Resumed without bolus. We will target for a lower therapeutic range.   Currently on IV heparin at 1300 units/hr. Very difficult stick, heparin level tonight was undetectable. H/H continues to trend down. Plt wnl. No s/s of over bleeding noted.  Goal of Therapy:  Heparin level= 0.3-0.5 Monitor platelets by anticoagulation protocol: Yes   Plan:  Increase heparin to 1450 units/hr F/u 8 hr HL  Daily HL/CBC F/u resume apixaban   Erin Hearing PharmD., BCPS Clinical Pharmacist 10/21/2021 7:28 PM

## 2021-10-22 DIAGNOSIS — M86171 Other acute osteomyelitis, right ankle and foot: Secondary | ICD-10-CM | POA: Diagnosis not present

## 2021-10-22 DIAGNOSIS — D649 Anemia, unspecified: Secondary | ICD-10-CM | POA: Diagnosis not present

## 2021-10-22 DIAGNOSIS — I825Y9 Chronic embolism and thrombosis of unspecified deep veins of unspecified proximal lower extremity: Secondary | ICD-10-CM | POA: Diagnosis not present

## 2021-10-22 LAB — GLUCOSE, CAPILLARY
Glucose-Capillary: 151 mg/dL — ABNORMAL HIGH (ref 70–99)
Glucose-Capillary: 176 mg/dL — ABNORMAL HIGH (ref 70–99)
Glucose-Capillary: 149 mg/dL — ABNORMAL HIGH (ref 70–99)
Glucose-Capillary: 152 mg/dL — ABNORMAL HIGH (ref 70–99)
Glucose-Capillary: 181 mg/dL — ABNORMAL HIGH (ref 70–99)

## 2021-10-22 LAB — HEPARIN LEVEL (UNFRACTIONATED): Heparin Unfractionated: 0.34 IU/mL (ref 0.30–0.70)

## 2021-10-22 MED ORDER — CHLORHEXIDINE GLUCONATE CLOTH 2 % EX PADS
6.0000 | MEDICATED_PAD | Freq: Every day | CUTANEOUS | Status: DC
  Administered 2021-10-23 – 2021-10-24 (×2): 6 via TOPICAL

## 2021-10-22 NOTE — Plan of Care (Signed)
  Problem: Clinical Measurements: Goal: Will remain free from infection Outcome: Adequate for Discharge   

## 2021-10-22 NOTE — Progress Notes (Addendum)
Pt. Refused AM blood sugar, leg brace and CHG bath

## 2021-10-22 NOTE — Progress Notes (Signed)
Orthopedic Tech Progress Note Patient Details:  Roger Vazquez [birth year] [id number]  Patient ID: Roger Vazquez, male   DOB: 05-25-1951, 71 y.o.   MRN: [id number] I assisted with getting ampushield re applied properly. Karolee Stamps 10/22/2021, 8:56 PM

## 2021-10-22 NOTE — Progress Notes (Signed)
ANTICOAGULATION CONSULT NOTE - Follow Up Consult  Pharmacy Consult for heparin  Indication: DVT  Allergies  Allergen Reactions   Sildenafil Other (See Comments)    Pt stated, "Makes my Blood Pressure drop"    Patient Measurements: Height: 5\' 6"  (167.6 cm) Weight: 55.5 kg (122 lb 5.7 oz) IBW/kg (Calculated) : 63.8   Vital Signs: Temp: 98.7 F (37.1 C) (01/08 0547) Temp Source: Oral (01/08 0547) BP: 154/64 (01/08 0547) Pulse Rate: 97 (01/08 0547)  Labs: Recent Labs    10/20/21 1022 10/20/21 1941 10/21/21 0457 10/21/21 1759 10/22/21 0800  HGB 9.9* 8.3* 7.7*  --   --   HCT 29.0* 25.8* 23.3*  --   --   PLT  --  206 211  --   --   HEPARINUNFRC  --   --  0.24* <0.10* 0.34  CREATININE 6.70* 3.39* 4.44*  --   --      Estimated Creatinine Clearance: 12 mL/min (A) (by C-G formula based on SCr of 4.44 mg/dL (H)).   Medical History: Past Medical History:  Diagnosis Date   Anemia    Blood dyscrasia    history of coagulation deficit per [facility] nursing home   Cancer [facility])    Lung   Diabetes mellitus    Hepatitis    Hypertension    Renal disorder    MWF- [facility]    Medications:  Medications Prior to Admission  Medication Sig Dispense Refill Last Dose   acetaminophen (TYLENOL) 325 MG tablet Take 650 mg by mouth every 6 (six) hours.   Past Week   amLODipine (NORVASC) 10 MG tablet Take 1 tablet (10 mg total) by mouth daily.   10/12/2021   apixaban (ELIQUIS) 5 MG TABS tablet Take 1 tablet (5 mg total) by mouth 2 (two) times daily. 60 tablet  10/12/2021 at 9am   aspirin 81 MG chewable tablet Chew 81 mg by mouth daily. (0800)   10/12/2021   calcium acetate (PHOSLO) 667 MG capsule Take 667 mg by mouth 3 (three) times daily with meals. (0800, 1300 & 1700)   10/12/2021   ferrous sulfate 325 (65 FE) MG tablet Take 325 mg by mouth daily with breakfast.   10/13/2021   hydrocortisone (ANUSOL-HC) 25 MG suppository Place 25 mg rectally 2 (two) times daily as  needed for hemorrhoids or anal itching.   unk   lidocaine-prilocaine (EMLA) cream Apply 1 application topically See admin instructions. As needed on dialysis days.Laurine Blazer and Friday)   Past Week   Multiple Vitamin (MULITIVITAMIN WITH MINERALS) TABS Take 1 tablet by mouth daily. 30 tablet 0 10/12/2021   Nutritional Supplements (NOVASOURCE RENAL PO) Take 237 mLs by mouth daily.   Past Week   oxyCODONE (OXY IR/ROXICODONE) 5 MG immediate release tablet Take 5 mg by mouth 2 (two) times daily as needed for severe pain.   10/12/2021   polyethylene glycol (MIRALAX / GLYCOLAX) 17 g packet Take 17 g by mouth 2 (two) times daily. 14 each 0 Past Week   senna (SENOKOT) 8.6 MG TABS tablet Take 1 tablet (8.6 mg total) by mouth daily. 7 tablet 0 10/12/2021   apixaban (ELIQUIS) 5 MG TABS tablet Take 2 tablets (10 mg total) by mouth 2 (two) times daily for 7 doses. (Patient not taking: Reported on 10/13/2021) 60 tablet  Not Taking   Scheduled:   amLODipine  10 mg Oral Daily   calcium acetate  667 mg Oral TID WC   Chlorhexidine Gluconate Cloth  6  each Topical Q0600   darbepoetin (ARANESP) injection - DIALYSIS  200 mcg Intravenous Q Fri-HD   feeding supplement (NEPRO CARB STEADY)  237 mL Oral TID BM   ferrous sulfate  325 mg Oral Q breakfast   insulin aspart  0-6 Units Subcutaneous TID WC   multivitamin  1 tablet Oral QHS   pantoprazole  40 mg Oral BID   polyethylene glycol  17 g Oral BID   senna  1 tablet Oral Daily   sodium chloride flush  3 mL Intravenous Q12H    Assessment: 71 yo male with recent DVT (07/2021) on apixaban PTA (last dose taken is unclear). He is noted with symptomatic anemia with plans for endoscopy/colonoscopy on 1/3. Pharmacy consulted to dose heparin. He is noted with ESRD  Pt is s/p EGD and colonoscopy. Heparin was held for the BKA 1/6. D/w Dr. Stanford Breed and heparin was resumed 6 hr post procedure. Resumed without bolus. We will target for a lower therapeutic range.   Heparin  level therapeutic 1/8 AM at 0.34 with heparin running at 1,450 units/h. Patient is a very difficult stick and has been refusing lab draws. It took significant convincing from the nursing staff to get him to agree to this heparin level. As such, will not get an 8-h confirmatory heparin level, and will empirically increase heparin rate as he is on the low end of the therapeutic range.   CBC has been down-trending, but patient refused labs 1/8 AM, F/U with AM labs.   Goal of Therapy:  Heparin level= 0.3-0.5 Monitor platelets by anticoagulation protocol: Yes   Plan:  Increase heparin to 1500 units/hr Daily HL/CBC F/u resume apixaban   Adria Dill, PharmD PGY-1 Acute Care Resident  10/22/2021 9:36 AM

## 2021-10-22 NOTE — Progress Notes (Addendum)
[facility] Progress Note   Subjective:   Patient seen and examined at bedside.  Reports he is doing well today, ate a little bit of breakfast.  Denies pain at surgical site, CP, SOB, abdominal pain, and n/v/d.    Objective Vitals:   10/21/21 1628 10/21/21 2336 10/22/21 0547 10/22/21 0941  BP: (!) 154/67 (!) 150/67 (!) 154/64 (!) 141/58  Pulse: (!) 101 99 97 94  Resp: 17 16 16 19   Temp: 98.2 F (36.8 C) 99.1 F (37.3 C) 98.7 F (37.1 C) 99.8 F (37.7 C)  TempSrc:  Oral Oral Axillary  SpO2: 98% 97% 97% 100%  Weight:      Height:       Physical Exam General:chronically ill appearing male in NAD Heart:RRR, no mrg Lungs:CTAB, nml WOB on RA Abdomen:soft, NTND Extremities:b/l BKA, no stump edema Dialysis Access: L IJ TDC, R AVG unuseable   Filed Weights   10/19/21 0600 10/20/21 1441 10/20/21 1805  Weight: 50.7 kg 57.3 kg 55.5 kg    Intake/Output Summary (Last 24 hours) at 10/22/2021 1131 Last data filed at 10/22/2021 0800 Gross per 24 hour  Intake 1622.16 ml  Output 0 ml  Net 1622.16 ml    Additional Objective Labs: Basic Metabolic Panel: Recent Labs  Lab 10/17/21 0221 10/18/21 0320 10/19/21 0718 10/20/21 1022 10/20/21 1941 10/21/21 0457  NA 135   < > 135 134* 133* 132*  K 4.2   < > 3.6 4.4 4.2 4.2  CL 100   < > 99 98 97* 96*  CO2 22   < > 26  --  26 25  GLUCOSE 154*   < > 139* 154* 171* 202*  BUN 22   < > 13 37* 11 18  CREATININE 4.71*   < > 4.45* 6.70* 3.39* 4.44*  CALCIUM 8.9   < > 8.8*  --  8.7* 9.0  PHOS 1.6*  --   --   --   --   --    < > = values in this interval not displayed.   Liver Function Tests: Recent Labs  Lab 10/17/21 0221  ALBUMIN 3.0*   CBC: Recent Labs  Lab 10/17/21 0221 10/18/21 0320 10/19/21 0718 10/20/21 1022 10/20/21 1941 10/21/21 0457  WBC 6.7 6.7 6.5  --  11.2* 13.5*  NEUTROABS  --   --  3.9  --  8.5* 10.8*  HGB 8.4* 7.6* 7.5* 9.9* 8.3* 7.7*  HCT 25.1* 22.8* 23.4* 29.0* 25.8* 23.3*  MCV 94.4 94.2 95.1   --  95.9 95.5  PLT 212 220 213  --  206 211    CBG: Recent Labs  Lab 10/21/21 0702 10/21/21 1137 10/21/21 1626 10/22/21 0123 10/22/21 0822  GLUCAP 195* 138* 159* 151* 176*   Medications:  sodium chloride 10 mL/hr (10/17/21 1018)   heparin 1,500 Units/hr (10/22/21 1112)    amLODipine  10 mg Oral Daily   calcium acetate  667 mg Oral TID WC   Chlorhexidine Gluconate Cloth  6 each Topical Q0600   darbepoetin (ARANESP) injection - DIALYSIS  200 mcg Intravenous Q Fri-HD   feeding supplement (NEPRO CARB STEADY)  237 mL Oral TID BM   ferrous sulfate  325 mg Oral Q breakfast   insulin aspart  0-6 Units Subcutaneous TID WC   multivitamin  1 tablet Oral QHS   pantoprazole  40 mg Oral BID   polyethylene glycol  17 g Oral BID   senna  1 tablet Oral Daily   sodium  chloride flush  3 mL Intravenous Q12H    Dialysis Orders: [facility]  on MWF . 180NRe, 3 hr 15 min, BFR 400, DFR A1.5, EDW 54kg, 2K 2Ca, TDC AVG with suture intact. no heparin Mircera 264mcg IV q HD- last dose was 133mcg on 10/19 Calcium acetate 667mg  1 tab TID with meals Tums 200mg - 2 tabs PO BID   Assessment/Plan: Symptomatic anemia: GIB suspected, GI following. Pt currently on PPI, Eliquis on hold.  Endoscopy on 10/17/21 with 1 non-bleeding superficial gastric ulcer w/no stigmata of bleeding in the prepyloric region, exam otherwise normal. S/P 1 unit PRBCs 10/13/2021. Hgb today 7.7. Follow labs. Aranesp ^ 217mcg qwk to due today. R heel ulceration w/Osteomyelitis. Seen by ID. No ABX. R BKA 10/20/21 by Dr. Stanford Breed.   ESRD:  Dialysis on MWF schedule. Next HD on 10/23/21.   Hypertension/volume: BP elevated this AM. Continue home meds - amlodipine 10mg  and prn hydralazine. Under dry weight with additional weight loss following surgery.  Will need lower EDW on DC.  UF as tolerated.   Metabolic bone disease: Corrected calcium in high 9s.  Last phos low.  Hold binders for now and trend labs, can add back once appetite improved.  Nutrition:   Renal diet. Low albumin in setting of Osteo. +protein supplements. Encouraged nutrition.  Malfunctioning AVG: Went to [facility] for [facility] placement and shuntogram earlier in December 2022. Needs referral to [facility] for new access when he is stable.   Jen Mow, PA-C [facility] 10/22/2021,11:31 AM  LOS: 9 days    Seen and examined independently.  Agree with note and exam as documented above by physician extender and as noted here.  He is sleeping on arrival but wakes with exam and states that he feels better today than yesterday.    General adult male in bed in no acute distress HEENT normocephalic atraumatic extraocular movements intact sclera anicteric Neck supple trachea midline Lungs clear to auscultation bilaterally normal work of breathing at rest  Heart S1S2 no rub Abdomen soft nontender nondistended Extremities bilateral BKA; no edema of residual limb Access LIJ tunn catheter    Anemia - s/p EGD  One non-bleeding superficial gastric ulcer with no stigmata of bleeding. On aranesp 200 mcg every week. May need PRBC's soon   ESRD - HD is per MWF schedule   Right heel ulceration - s/p right BKA   Malfunctioning AVG - not usable per charting and later will need referral for new access outpatient   Claudia Desanctis, MD 10/22/2021  12:47 PM

## 2021-10-22 NOTE — Progress Notes (Signed)
Progress Note    Roger Vazquez   GUY:[id number]  DOB: 05-12-1951  DOA: 10/12/2021     9 PCP: [facility]  Initial CC: abnormal labs  Hospital Course: [name] is a 71 yo male with PMH ESRD on HD MWF, PAD s/p LBKA, DMII, HTN, anemia of chronic disease who presented with a hemoglobin of 6.4 g/dL from his SNF He was also reported to be feeling weak and tired. He underwent evaluation with GI regarding his anemia with EGD and colonoscopy.  EGD showed small gastric ulcer and he was continued on a PPI.  Colonoscopy showed diverticulosis.  He also had an ongoing wound to his right heel.  He underwent further evaluation with MRI of the right foot which showed osteomyelitis involving the calcaneus.  He was evaluated by vascular surgery recommendations for a right BKA which was performed on 10/20/2021.  Interval History:  No events overnight.  Pain better controlled today compared to yesterday.  Sleeping in bed but awakens easily and answers questions.  Assessment & Plan: * Symptomatic anemia- (present on admission) -Patient with Hgb 12.6 on 11/2 and 6.6 g/dL on admission - s/p EGD (small gastric ulcer) and CLN (diverticulosis) - indefinite PPI daily for gastric ulcer. No repeat CLN needed for surveillance/screening purposes   Acute osteomyelitis of right calcaneus ([place]) -Patient with known heel ulcer, has been followed by vascular -He had failed ABIs recently with inadequate perfusion to promote wound healing - evaluated by vascular surgery with recommendations for RBKA on 10/20/21 - he is wheelchair bound; already had LBKA and does not use a prosthesis -post op PT/OT evals ordered  ESRD on hemodialysis ([place]) -Patient on chronic MWF HD - continue HD inpt per nephrology -Continue Phoslo  Acute gastric ulcer without hemorrhage or perforation - Continue daily PPI indefinitely  DVT (deep venous thrombosis) ([place]) -Duplex (10/16/21) notes chronic DVT of SF junction -Has been  on Eliquis -Hold Eliquis for now; possible transition back to Eliquis after the weekend if hemoglobin remains stable postop - currently on heparin drip   Lymphadenopathy -He is due for lymph node biopsy in the axillary region on 1/3 at the [place]  HTN (hypertension)- (present on admission) -Continue Norvasc -Will cover with prn IV hydralazine  Type 2 diabetes mellitus with chronic kidney disease on chronic dialysis ([place]) -Prior A1c was 7.9% on 04/27/21 - repeat A1c now 6.6% on 10/18/21 - continue SSI and CBG monitoring    Old records reviewed in assessment of this patient  Antimicrobials: Rocephin 10/14/21 x 1  DVT prophylaxis: Heparin drip  Code Status:   Code Status: Full Code  Disposition Plan:  [facility] if able to return  Status is: Inpt  Objective: Blood pressure (!) 141/58, pulse 94, temperature 99.8 F (37.7 C), temperature source Axillary, resp. rate 19, height 5\' 6"  (1.676 m), weight 55.5 kg, SpO2 100 %.  Examination:  Physical Exam Constitutional:      General: He is not in acute distress.    Appearance: Normal appearance.  HENT:     Head: Normocephalic and atraumatic.     Mouth/Throat:     Mouth: Mucous membranes are moist.  Eyes:     Extraocular Movements: Extraocular movements intact.  Cardiovascular:     Rate and Rhythm: Normal rate and regular rhythm.     Heart sounds: Normal heart sounds.  Pulmonary:     Effort: Pulmonary effort is normal. No respiratory distress.     Breath sounds: Normal breath sounds. No wheezing.  Abdominal:  General: Bowel sounds are normal. There is no distension.     Palpations: Abdomen is soft.     Tenderness: There is no abdominal tenderness.  Musculoskeletal:     Cervical back: Normal range of motion and neck supple.     Comments: L BKA noted.  Right BKA noted as well with surgical boot in place and VAC drain  Skin:    General: Skin is warm and dry.  Neurological:     General: No focal deficit present.     Mental  Status: He is alert.  Psychiatric:        Mood and Affect: Mood normal.        Behavior: Behavior normal.     Consultants:  Vascular surgery ID Nephrology  Procedures:  Right BKA, 10/20/21  Data Reviewed: I have personally reviewed labs and imaging studies    LOS: 9 days  Time spent: Greater than 50% of the 35 minute visit was spent in counseling/coordination of care for the patient as laid out in the A&P.   Dwyane Dee, MD [facility] 10/22/2021, 1:33 PM

## 2021-10-23 ENCOUNTER — Encounter (HOSPITAL_COMMUNITY): Payer: Self-pay | Admitting: Vascular Surgery

## 2021-10-23 DIAGNOSIS — M86171 Other acute osteomyelitis, right ankle and foot: Secondary | ICD-10-CM | POA: Diagnosis not present

## 2021-10-23 DIAGNOSIS — I825Y9 Chronic embolism and thrombosis of unspecified deep veins of unspecified proximal lower extremity: Secondary | ICD-10-CM | POA: Diagnosis not present

## 2021-10-23 DIAGNOSIS — D649 Anemia, unspecified: Secondary | ICD-10-CM | POA: Diagnosis not present

## 2021-10-23 DIAGNOSIS — L97519 Non-pressure chronic ulcer of other part of right foot with unspecified severity: Secondary | ICD-10-CM

## 2021-10-23 DIAGNOSIS — I70235 Atherosclerosis of native arteries of right leg with ulceration of other part of foot: Secondary | ICD-10-CM

## 2021-10-23 DIAGNOSIS — E1151 Type 2 diabetes mellitus with diabetic peripheral angiopathy without gangrene: Secondary | ICD-10-CM

## 2021-10-23 LAB — BASIC METABOLIC PANEL
Anion gap: 14 (ref 5–15)
BUN: 40 mg/dL — ABNORMAL HIGH (ref 8–23)
CO2: 25 mmol/L (ref 22–32)
Calcium: 9.3 mg/dL (ref 8.9–10.3)
Chloride: 95 mmol/L — ABNORMAL LOW (ref 98–111)
Creatinine, Ser: 8.29 mg/dL — ABNORMAL HIGH (ref 0.61–1.24)
GFR, Estimated: 6 mL/min — ABNORMAL LOW (ref >60)
Glucose, Bld: 215 mg/dL — ABNORMAL HIGH (ref 70–99)
Potassium: 4.3 mmol/L (ref 3.5–5.1)
Sodium: 134 mmol/L — ABNORMAL LOW (ref 135–145)

## 2021-10-23 LAB — CBC WITH DIFFERENTIAL/PLATELET
Abs Immature Granulocytes: 0.18 10*3/uL — ABNORMAL HIGH (ref 0.00–0.07)
Basophils Absolute: 0.1 10*3/uL (ref 0.0–0.1)
Basophils Relative: 0 %
Eosinophils Absolute: 0.4 10*3/uL (ref 0.0–0.5)
Eosinophils Relative: 3 %
HCT: 21.7 % — ABNORMAL LOW (ref 39.0–52.0)
Hemoglobin: 7 g/dL — ABNORMAL LOW (ref 13.0–17.0)
Immature Granulocytes: 1 %
Lymphocytes Relative: 11 %
Lymphs Abs: 1.4 10*3/uL (ref 0.7–4.0)
MCH: 31.1 pg (ref 26.0–34.0)
MCHC: 32.3 g/dL (ref 30.0–36.0)
MCV: 96.4 fL (ref 80.0–100.0)
Monocytes Absolute: 1.3 10*3/uL — ABNORMAL HIGH (ref 0.1–1.0)
Monocytes Relative: 10 %
Neutro Abs: 9.9 10*3/uL — ABNORMAL HIGH (ref 1.7–7.7)
Neutrophils Relative %: 75 %
Platelets: 259 10*3/uL (ref 150–400)
RBC: 2.25 MIL/uL — ABNORMAL LOW (ref 4.22–5.81)
RDW: 19.7 % — ABNORMAL HIGH (ref 11.5–15.5)
WBC: 13.2 10*3/uL — ABNORMAL HIGH (ref 4.0–10.5)
nRBC: 0.2 % (ref 0.0–0.2)

## 2021-10-23 LAB — GLUCOSE, CAPILLARY
Glucose-Capillary: 211 mg/dL — ABNORMAL HIGH (ref 70–99)
Glucose-Capillary: 111 mg/dL — ABNORMAL HIGH (ref 70–99)
Glucose-Capillary: 174 mg/dL — ABNORMAL HIGH (ref 70–99)
Glucose-Capillary: 209 mg/dL — ABNORMAL HIGH (ref 70–99)

## 2021-10-23 LAB — PHOSPHORUS: Phosphorus: 3.1 mg/dL (ref 2.5–4.6)

## 2021-10-23 LAB — MAGNESIUM: Magnesium: 2.2 mg/dL (ref 1.7–2.4)

## 2021-10-23 LAB — PREPARE RBC (CROSSMATCH)

## 2021-10-23 LAB — ALBUMIN: Albumin: 2.9 g/dL — ABNORMAL LOW (ref 3.5–5.0)

## 2021-10-23 LAB — HEPARIN LEVEL (UNFRACTIONATED): Heparin Unfractionated: 0.37 IU/mL (ref 0.30–0.70)

## 2021-10-23 LAB — SURGICAL PATHOLOGY

## 2021-10-23 MED ORDER — SODIUM CHLORIDE 0.9% IV SOLUTION: Freq: Once | INTRAVENOUS | Status: DC

## 2021-10-23 MED ORDER — SODIUM CHLORIDE 0.9 % IV SOLN: 100.0000 mL | INTRAVENOUS | Status: DC | PRN

## 2021-10-23 MED ORDER — PENTAFLUOROPROP-TETRAFLUOROETH EX AERO: 1.0000 "application " | INHALATION_SPRAY | CUTANEOUS | Status: DC | PRN

## 2021-10-23 MED ORDER — HEPARIN SODIUM (PORCINE) 1000 UNIT/ML DIALYSIS
1000.0000 [IU] | INTRAMUSCULAR | Status: DC | PRN
  Filled 2021-10-23: qty 1

## 2021-10-23 MED ORDER — BUPIVACAINE-EPINEPHRINE (PF) 0.5% -1:200000 IJ SOLN
INTRAMUSCULAR | Status: DC | PRN
  Administered 2021-10-20: 15 mL via PERINEURAL
  Administered 2021-10-20: 10 mL via PERINEURAL

## 2021-10-23 MED ORDER — LIDOCAINE HCL (PF) 1 % IJ SOLN: 5.0000 mL | INTRAMUSCULAR | Status: DC | PRN

## 2021-10-23 MED ORDER — LIDOCAINE-PRILOCAINE 2.5-2.5 % EX CREA: 1.0000 "application " | TOPICAL_CREAM | CUTANEOUS | Status: DC | PRN

## 2021-10-23 MED ORDER — LIDOCAINE-EPINEPHRINE 2 %-1:100000 IJ SOLN
INTRAMUSCULAR | Status: DC | PRN
  Administered 2021-10-20: 10 mL via PERINEURAL
  Administered 2021-10-20: 5 mL via PERINEURAL

## 2021-10-23 MED ORDER — ALTEPLASE 2 MG IJ SOLR: 2.0000 mg | Freq: Once | INTRAMUSCULAR | Status: DC | PRN

## 2021-10-23 NOTE — Anesthesia Procedure Notes (Signed)
Anesthesia Regional Block: Femoral nerve block   Pre-Anesthetic Checklist: , timeout performed,  Correct Patient, Correct Site, Correct Laterality,  Correct Procedure, Correct Position, site marked,  Risks and benefits discussed,  Surgical consent,  Pre-op evaluation,  At surgeon's request and post-op pain management  Laterality: Right and Lower  Prep: chloraprep       Needles:  Injection technique: Single-shot      Needle Length: 9cm  Needle Gauge: 21     Additional Needles: Arrow StimuQuik ECHO Echogenic Stimulating PNB Needle  Procedures:,,,, ultrasound used (permanent image in chart),,    Narrative:  Start time: 10/20/2021 10:44 AM End time: 10/20/2021 10:50 AM Injection made incrementally with aspirations every 5 mL.  Performed by: Personally  Anesthesiologist: Oleta Mouse, MD

## 2021-10-23 NOTE — Anesthesia Preprocedure Evaluation (Signed)
Anesthesia Evaluation  Patient identified by MRN, date of birth, ID band Patient awake    Reviewed: Allergy & Precautions, NPO status , Patient's Chart, lab work & pertinent test results  History of Anesthesia Complications Negative for: history of anesthetic complications  Airway Mallampati: I  TM Distance: >3 FB Neck ROM: Full    Dental  (+) Edentulous Upper, Edentulous Lower   Pulmonary shortness of breath, neg sleep apnea, neg recent URI, former smoker,    breath sounds clear to auscultation       Cardiovascular hypertension, Pt. on medications + Peripheral Vascular Disease   Rhythm:Regular Rate:Normal     Neuro/Psych  Headaches, negative psych ROS   GI/Hepatic GERD  Medicated,(+) Hepatitis -, C  Endo/Other  diabetes, Type 2, Insulin Dependent  Renal/GU Dialysis and ESRFRenal disease     Musculoskeletal   Abdominal   Peds  Hematology  (+) Blood dyscrasia, anemia ,   Anesthesia Other Findings   Reproductive/Obstetrics                             Anesthesia Physical Anesthesia Plan  ASA: 3  Anesthesia Plan: MAC and Regional   Post-op Pain Management: Regional block   Induction:   PONV Risk Score and Plan: 1 and Propofol infusion and Treatment may vary due to age or medical condition  Airway Management Planned: Nasal Cannula  Additional Equipment: None  Intra-op Plan:   Post-operative Plan:   Informed Consent: I have reviewed the patients History and Physical, chart, labs and discussed the procedure including the risks, benefits and alternatives for the proposed anesthesia with the patient or authorized representative who has indicated his/her understanding and acceptance.     Dental advisory given  Plan Discussed with: CRNA and Anesthesiologist  Anesthesia Plan Comments:         Anesthesia Quick Evaluation

## 2021-10-23 NOTE — Progress Notes (Signed)
ANTICOAGULATION CONSULT NOTE - Follow Up Consult  Pharmacy Consult for heparin  Indication: DVT  Allergies  Allergen Reactions   Sildenafil Other (See Comments)    Pt stated, "Makes my Blood Pressure drop"    Patient Measurements: Height: 5\' 6"  (167.6 cm) Weight: 55.5 kg (122 lb 5.7 oz) IBW/kg (Calculated) : 63.8   Vital Signs: Temp: 98.7 F (37.1 C) (01/09 0438) Temp Source: Oral (01/09 0438) BP: 152/73 (01/09 0438) Pulse Rate: 95 (01/09 0438)  Labs: Recent Labs    10/20/21 1941 10/20/21 1941 10/21/21 0457 10/21/21 1759 10/22/21 0800 10/23/21 0613  HGB 8.3*  --  7.7*  --   --  7.0*  HCT 25.8*  --  23.3*  --   --  21.7*  PLT 206  --  211  --   --  259  HEPARINUNFRC  --    < > 0.24* <0.10* 0.34 0.37  CREATININE 3.39*  --  4.44*  --   --  8.29*   < > = values in this interval not displayed.     Estimated Creatinine Clearance: 6.4 mL/min (A) (by C-G formula based on SCr of 8.29 mg/dL (H)).   Medical History: Past Medical History:  Diagnosis Date   Anemia    Blood dyscrasia    history of coagulation deficit per [facility] nursing home   Cancer [facility])    Lung   Diabetes mellitus    Hepatitis    Hypertension    Renal disorder    MWF- [facility]    Medications:  Medications Prior to Admission  Medication Sig Dispense Refill Last Dose   acetaminophen (TYLENOL) 325 MG tablet Take 650 mg by mouth every 6 (six) hours.   Past Week   amLODipine (NORVASC) 10 MG tablet Take 1 tablet (10 mg total) by mouth daily.   10/12/2021   apixaban (ELIQUIS) 5 MG TABS tablet Take 1 tablet (5 mg total) by mouth 2 (two) times daily. 60 tablet  10/12/2021 at 9am   aspirin 81 MG chewable tablet Chew 81 mg by mouth daily. (0800)   10/12/2021   calcium acetate (PHOSLO) 667 MG capsule Take 667 mg by mouth 3 (three) times daily with meals. (0800, 1300 & 1700)   10/12/2021   ferrous sulfate 325 (65 FE) MG tablet Take 325 mg by mouth daily with breakfast.   10/13/2021    hydrocortisone (ANUSOL-HC) 25 MG suppository Place 25 mg rectally 2 (two) times daily as needed for hemorrhoids or anal itching.   unk   lidocaine-prilocaine (EMLA) cream Apply 1 application topically See admin instructions. As needed on dialysis days.Laurine Blazer and Friday)   Past Week   Multiple Vitamin (MULITIVITAMIN WITH MINERALS) TABS Take 1 tablet by mouth daily. 30 tablet 0 10/12/2021   Nutritional Supplements (NOVASOURCE RENAL PO) Take 237 mLs by mouth daily.   Past Week   oxyCODONE (OXY IR/ROXICODONE) 5 MG immediate release tablet Take 5 mg by mouth 2 (two) times daily as needed for severe pain.   10/12/2021   polyethylene glycol (MIRALAX / GLYCOLAX) 17 g packet Take 17 g by mouth 2 (two) times daily. 14 each 0 Past Week   senna (SENOKOT) 8.6 MG TABS tablet Take 1 tablet (8.6 mg total) by mouth daily. 7 tablet 0 10/12/2021   apixaban (ELIQUIS) 5 MG TABS tablet Take 2 tablets (10 mg total) by mouth 2 (two) times daily for 7 doses. (Patient not taking: Reported on 10/13/2021) 60 tablet  Not Taking   Scheduled:  sodium chloride   Intravenous Once   amLODipine  10 mg Oral Daily   calcium acetate  667 mg Oral TID WC   Chlorhexidine Gluconate Cloth  6 each Topical Q0600   Chlorhexidine Gluconate Cloth  6 each Topical Q0600   darbepoetin (ARANESP) injection - DIALYSIS  200 mcg Intravenous Q Fri-HD   feeding supplement (NEPRO CARB STEADY)  237 mL Oral TID BM   ferrous sulfate  325 mg Oral Q breakfast   insulin aspart  0-6 Units Subcutaneous TID WC   multivitamin  1 tablet Oral QHS   pantoprazole  40 mg Oral BID   polyethylene glycol  17 g Oral BID   senna  1 tablet Oral Daily   sodium chloride flush  3 mL Intravenous Q12H    Assessment: 71 yo male with recent DVT (07/2021) on apixaban PTA (last dose taken is unclear). He is noted with symptomatic anemia with plans for endoscopy/colonoscopy on 1/3. Pharmacy consulted to dose heparin. He is noted with ESRD  Pt is s/p EGD and  colonoscopy. Heparin level came back in modified goal today. His hgb dropped down to 7 today. Apixaban is still on hold.   Goal of Therapy:  Heparin level= 0.3-0.5 Monitor platelets by anticoagulation protocol: Yes   Plan:  Cont heparin 1500 units/hr Daily HL/CBC F/u resume apixaban   Onnie Boer, PharmD, BCIDP, AAHIVP, CPP Infectious Disease Pharmacist 10/23/2021 10:19 AM

## 2021-10-23 NOTE — Anesthesia Postprocedure Evaluation (Signed)
Anesthesia Post Note  Patient: Roger Vazquez  Procedure(s) Performed: RIGHT BELOW KNEE AMPUTATION (Right: Knee) APPLICATION OF WOUND VAC (Right: Leg Lower)     Patient location during evaluation: PACU Anesthesia Type: MAC and Regional Level of consciousness: awake and alert Pain management: pain level controlled Vital Signs Assessment: post-procedure vital signs reviewed and stable Respiratory status: spontaneous breathing, nonlabored ventilation, respiratory function stable and patient connected to nasal cannula oxygen Cardiovascular status: stable and blood pressure returned to baseline Postop Assessment: no apparent nausea or vomiting Anesthetic complications: no   No notable events documented.  Last Vitals:  Vitals:   10/22/21 2100 10/23/21 0438  BP: (!) 148/61 (!) 152/73  Pulse: 94 95  Resp: 18 18  Temp: 37.2 C 37.1 C  SpO2: 98% 100%    Last Pain:  Vitals:   10/23/21 0438  TempSrc: Oral  PainSc:                  Thong Feeny

## 2021-10-23 NOTE — Progress Notes (Signed)
Progress Note    Roger Vazquez   OMV:[id number]  DOB: May 31, 1951  DOA: 10/12/2021     10 PCP: [facility]  Initial CC: abnormal labs  Hospital Course: Mr. Stancil is a 71 yo male with PMH ESRD on HD MWF, PAD s/p LBKA, DMII, HTN, anemia of chronic disease who presented with a hemoglobin of 6.4 g/dL from his SNF He was also reported to be feeling weak and tired. He underwent evaluation with GI regarding his anemia with EGD and colonoscopy.  EGD showed small gastric ulcer and he was continued on a PPI.  Colonoscopy showed diverticulosis.  He also had an ongoing wound to his right heel.  He underwent further evaluation with MRI of the right foot which showed osteomyelitis involving the calcaneus.  He was evaluated by vascular surgery recommendations for a right BKA which was performed on 10/20/2021.  Interval History:  No events overnight.  Pain is more controlled but he is much more withdrawn and depressed/quiet in bed this morning.  He was already anxious anticipating amputation so this is not unexpected.  Reaching out to see if chaplain able to talk with patient some today. Reviewed patient's labs with him this morning, informed him of need for 1 unit PRBC, he was okay with this.  Assessment & Plan: * Symptomatic anemia- (present on admission) -Patient with Hgb 12.6 on 11/2 and 6.6 g/dL on admission - s/p EGD (small gastric ulcer) and CLN (diverticulosis) - indefinite PPI daily for gastric ulcer. No repeat CLN needed for surveillance/screening purposes  - Hgb downtrend to 7 g/dL today; likely multifactorial with recent surgery as well.  - transfuse 1 unit PRBC with HD today - repeat CBC in am   Acute osteomyelitis of right calcaneus [facility]) -Patient with known heel ulcer, has been followed by vascular -He had failed ABIs recently with inadequate perfusion to promote wound healing - evaluated by vascular surgery with recommendations for RBKA on 10/20/21 - he is wheelchair  bound; already had LBKA and does not use a prosthesis -post op PT/OT evals ordered - more depressed today, 1/9 likely from amputation; will ask for chaplain to see patient if able  - VAC dressing in place; inform Dr. Stanford Breed prior to discharge for removal; outpt followup for staple removal (2-3 weeks)  ESRD on hemodialysis ([place]) -Patient on chronic MWF HD - continue HD inpt per nephrology -Continue Phoslo  Acute gastric ulcer without hemorrhage or perforation - Continue daily PPI indefinitely  DVT (deep venous thrombosis) ([place]) -Duplex (10/16/21) notes chronic DVT of SF junction -Has been on Eliquis -Once hemoglobin is stable postop for at least 24 hours, can discontinue heparin drip and resume Eliquis then will be ready for discharge  Lymphadenopathy -He is due for lymph node biopsy in the axillary region on 1/3 at the [place]  HTN (hypertension)- (present on admission) -Continue Norvasc -Will cover with prn IV hydralazine  Type 2 diabetes mellitus with chronic kidney disease on chronic dialysis ([place]) -Prior A1c was 7.9% on 04/27/21 - repeat A1c now 6.6% on 10/18/21 - continue SSI and CBG monitoring    Old records reviewed in assessment of this patient  Antimicrobials: Rocephin 10/14/21 x 1  DVT prophylaxis: Heparin drip  Code Status:   Code Status: Full Code  Disposition Plan: Once hemoglobin consistently stable, would be ready for discharging back to SNF Status is: Inpt  Objective: Blood pressure (!) 143/65, pulse 89, temperature 98.5 F (36.9 C), resp. rate 16, height 5\' 6"  (1.676 m), weight  55.5 kg, SpO2 99 %.  Examination:  Physical Exam Constitutional:      General: He is not in acute distress.    Appearance: Normal appearance.  HENT:     Head: Normocephalic and atraumatic.     Mouth/Throat:     Mouth: Mucous membranes are moist.  Eyes:     Extraocular Movements: Extraocular movements intact.  Cardiovascular:     Rate and Rhythm: Normal rate and regular rhythm.      Heart sounds: Normal heart sounds.  Pulmonary:     Effort: Pulmonary effort is normal. No respiratory distress.     Breath sounds: Normal breath sounds. No wheezing.  Abdominal:     General: Bowel sounds are normal. There is no distension.     Palpations: Abdomen is soft.     Tenderness: There is no abdominal tenderness.  Musculoskeletal:     Cervical back: Normal range of motion and neck supple.     Comments: L BKA noted.  Right BKA noted as well with surgical boot in place and VAC drain  Skin:    General: Skin is warm and dry.  Neurological:     General: No focal deficit present.     Mental Status: He is alert.  Psychiatric:        Mood and Affect: Mood normal.        Behavior: Behavior normal.     Consultants:  Vascular surgery ID Nephrology  Procedures:  Right BKA, 10/20/21  Data Reviewed: I have personally reviewed labs and imaging studies    LOS: 10 days  Time spent: Greater than 50% of the 35 minute visit was spent in counseling/coordination of care for the patient as laid out in the A&P.   Dwyane Dee, MD [facility] 10/23/2021, 1:43 PM

## 2021-10-23 NOTE — Progress Notes (Signed)
Pt. refused dialysis treatment this morning. Pt. educated, verbalized understanding, HD nurse made aware.

## 2021-10-23 NOTE — Anesthesia Procedure Notes (Signed)
Anesthesia Regional Block: Sciatic   Pre-Anesthetic Checklist: , timeout performed,  Correct Patient, Correct Site, Correct Laterality,  Correct Procedure, Correct Position, site marked,  Risks and benefits discussed,  Surgical consent,  Pre-op evaluation,  At surgeon's request and post-op pain management  Laterality: Right and Lower  Prep: chloraprep       Needles:  Injection technique: Single-shot      Needle Length: 9cm  Needle Gauge: 21     Additional Needles: Arrow StimuQuik ECHO Echogenic Stimulating PNB Needle  Procedures:,,,, ultrasound used (permanent image in chart),,    Narrative:  Start time: 10/20/2021 10:51 AM End time: 10/20/2021 10:56 AM Injection made incrementally with aspirations every 5 mL.  Performed by: Personally  Anesthesiologist: Oleta Mouse, MD

## 2021-10-23 NOTE — Progress Notes (Signed)
°   10/23/21 1600  Clinical Encounter Type  Visited With Patient  Visit Type Initial;Psychological support;Spiritual support  Referral From Nurse  Consult/Referral To Chaplain   Chaplain Jorene Guest responded to the consult request stating the patient is depressed. The patient was on dialysis but gave thumbs up when Chaplain introduced self. Chaplain provided compassionate touch during prayer. This note was prepared by Jeanine Luz, M.Div..  For questions please contact by phone [phone].

## 2021-10-23 NOTE — Progress Notes (Signed)
[facility] Progress Note   Subjective:Seen in room, very weak and lethargic today. Periorbital and peripheral edema present. Started on O2 via [place]. Sending to HD now.    Objective Vitals:   10/22/21 1642 10/22/21 2100 10/23/21 0438 10/23/21 1020  BP: (!) 147/63 (!) 148/61 (!) 152/73 (!) 143/65  Pulse: 93 94 95 89  Resp: 16 18 18 16   Temp: 98.5 F (36.9 C) 98.9 F (37.2 C) 98.7 F (37.1 C) 98.5 F (36.9 C)  TempSrc:  Oral Oral   SpO2: 100% 98% 100% 99%  Weight:      Height:       Physical Exam General:chronically ill appearing male in NAD HEENT: Periorbital edema present.  Heart:RRR, no mrg Lungs: Bilateral breath sounds slightly decreased in bases with few bibasilar crackles. On RA.  Abdomen:soft, NTND Extremities:b/l BKA, R BKA with stump protector. no L stump edema but has edema on both hips, lower abdomen.  Dialysis Access: L IJ TDC, R AVG unuseable     Additional Objective Labs: Basic Metabolic Panel: Recent Labs  Lab 10/17/21 0221 10/18/21 0320 10/20/21 1941 10/21/21 0457 10/23/21 0613  NA 135   < > 133* 132* 134*  K 4.2   < > 4.2 4.2 4.3  CL 100   < > 97* 96* 95*  CO2 22   < > 26 25 25   GLUCOSE 154*   < > 171* 202* 215*  BUN 22   < > 11 18 40*  CREATININE 4.71*   < > 3.39* 4.44* 8.29*  CALCIUM 8.9   < > 8.7* 9.0 9.3  PHOS 1.6*  --   --   --   --    < > = values in this interval not displayed.   Liver Function Tests: Recent Labs  Lab 10/17/21 0221  ALBUMIN 3.0*   No results for input(s): LIPASE, AMYLASE in the last 168 hours. CBC: Recent Labs  Lab 10/18/21 0320 10/18/21 0320 10/19/21 0718 10/20/21 1022 10/20/21 1941 10/21/21 0457 10/23/21 0613  WBC 6.7  --  6.5  --  11.2* 13.5* 13.2*  NEUTROABS  --    < > 3.9  --  8.5* 10.8* 9.9*  HGB 7.6*  --  7.5*   < > 8.3* 7.7* 7.0*  HCT 22.8*  --  23.4*   < > 25.8* 23.3* 21.7*  MCV 94.2  --  95.1  --  95.9 95.5 96.4  PLT 220  --  213  --  206 211 259   < > = values in this interval  not displayed.   Blood Culture    Component Value Date/Time   SDES URINE, CLEAN CATCH 10/14/2021 1040   [place] 10/14/2021 1040   CULT  10/14/2021 1040    NO GROWTH Performed at [facility], [place] [address] [place],  [address]    REPTSTATUS 10/15/2021 FINAL 10/14/2021 1040    Cardiac Enzymes: No results for input(s): CKTOTAL, CKMB, CKMBINDEX, TROPONINI in the last 168 hours. CBG: Recent Labs  Lab 10/22/21 1142 10/22/21 1642 10/22/21 2102 10/23/21 0634 10/23/21 1136  GLUCAP 149* 152* 181* 211* 111*   Iron Studies: No results for input(s): IRON, TIBC, TRANSFERRIN, FERRITIN in the last 72 hours. @lablastinr3 @ Studies/Results: No results found. Medications:  sodium chloride 10 mL/hr (10/17/21 1018)   sodium chloride     sodium chloride     heparin 1,500 Units/hr (10/23/21 0713)    sodium chloride   Intravenous Once   amLODipine  10 mg Oral Daily   calcium acetate  667 mg Oral TID WC   Chlorhexidine Gluconate Cloth  6 each Topical Q0600   Chlorhexidine Gluconate Cloth  6 each Topical Q0600   darbepoetin (ARANESP) injection - DIALYSIS  200 mcg Intravenous Q Fri-HD   feeding supplement (NEPRO CARB STEADY)  237 mL Oral TID BM   ferrous sulfate  325 mg Oral Q breakfast   insulin aspart  0-6 Units Subcutaneous TID WC   multivitamin  1 tablet Oral QHS   pantoprazole  40 mg Oral BID   polyethylene glycol  17 g Oral BID   senna  1 tablet Oral Daily   sodium chloride flush  3 mL Intravenous Q12H     Dialysis Orders: [facility]  on MWF . 180NRe, 3 hr 15 min, BFR 400, DFR A1.5, EDW 54kg, 2K 2Ca, TDC AVG with suture intact. no heparin Mircera 217mcg IV q HD- last dose was 143mcg on 10/19 Calcium acetate 667mg  1 tab TID with meals Tums 200mg - 2 tabs PO BID   Assessment/Plan: Symptomatic anemia: GIB suspected, GI following. Pt currently on PPI, Eliquis on hold.  Endoscopy on 10/17/21 with 1 non-bleeding superficial gastric ulcer w/no stigmata of bleeding in  the prepyloric region, exam otherwise normal. S/P 1 unit PRBCs 10/13/2021. Hgb today 7.0. Transfuse 1 unit PRBCS in HD today. Aranesp ^ 255mcg qwk.  R heel ulceration w/Osteomyelitis. Seen by ID. No ABX. R BKA 10/20/21 by Dr. Stanford Breed.   ESRD:  Dialysis on MWF schedule. Next HD on 10/25/21.   Hypertension/volume: BP elevated this AM. Continue home meds - amlodipine 10mg  and prn hydralazine. Under dry weight with additional weight loss following surgery. Evidence of volume overload by exam today, increased UFG. Will need lower EDW on DC.  UF as tolerated.   Metabolic bone disease: Corrected calcium in high 9s.  Last phos low but hasn't been checked since 10/17/2021. PO4 added to labs.  Hold binders for now and trend labs, can add back once appetite improved.  Nutrition:  Renal diet. Low albumin in setting of Osteo. +protein supplements. Encouraged nutrition.  Malfunctioning AVG: [facility] to [facility] for [facility] placement and shuntogram earlier in December 2022. Needs referral to [facility] for new access when he is stable.    Zaylon Bossier H. Valente Fosberg NP-C 10/23/2021, 12:13 PM  [employer] [phone]

## 2021-10-23 NOTE — Progress Notes (Signed)
Nutrition Follow-up  DOCUMENTATION CODES:   Not applicable  INTERVENTION:  -Continue Nepro Shake po TID, each supplement provides 425 kcal and 19 grams protein -Continue Rena-vit with minerals daily  NUTRITION DIAGNOSIS:   Increased nutrient needs related to chronic illness, wound healing (ESRD on HD, unstageable heel wound) as evidenced by estimated needs.  On going  GOAL:   Patient will meet greater than or equal to 90% of their needs  Progressing; addressing needs through mealtime PO and nutritional supplements  MONITOR:   PO intake, Supplement acceptance, Labs, Skin  REASON FOR ASSESSMENT:   Consult Wound healing  ASSESSMENT:   71 yo male admitted with symptomatic anemia. PMH includes ESRD on HD, PAD, L BKA, DM, HTN.  EGD showed small gastric ulcer which is being treated with PPI. 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 Spoke with RN who states that he has been eating some despite noted documented meal completions of 0%. She also reports pt is drinking the nutrition supplements. Will continue to try send supplements and follow up with patient.   Post dialysis weight (1/6): 55.5 kg (122 lbs)   Labs reviewed.  Sodium 134, phos 3.1 WNL CBG: 111-211 x 24 hours   Medications reviewed and include Phoslo, ferrous sulfate, Novolog, MVI with minerals, Protonix, Miralax, senna   UOP: 37mL I/O's: 1.7L since admission  NUTRITION - FOCUSED PHYSICAL EXAM: Pt out of room  Diet Order:   Diet Order             Diet renal with fluid restriction Fluid restriction: 1200 mL Fluid; Room service appropriate? Yes; Fluid consistency: Thin  Diet effective now                   EDUCATION NEEDS:   Not appropriate for education at this time  Skin:  Skin Assessment: Skin Integrity Issues: Skin Integrity Issues:: Incisions Unstageable: R heel Incisions: R BKA  Last BM:  10/21/21  Height:   Ht Readings from Last  1 Encounters:  10/13/21 5\' 6"  (1.676 m)    Weight:   Wt Readings from Last 1 Encounters:  10/23/21 53.8 kg    Ideal Body Weight:  56.1 kg (adj for bilateral BKA)  BMI:  Body mass index is 19.14 kg/m.  Estimated Nutritional Needs:   Kcal:  1800-2000  Protein:  75-90 gm  Fluid:  1 L + UOP  Clayborne Dana, RDN, LDN Clinical Nutrition

## 2021-10-23 NOTE — Progress Notes (Signed)
[facility] [place] PROGRESS NOTE  ASSESSMENT / PLAN: Roger Vazquez is a 71 y.o. male status post right below knee amputation 10/20/21. Anemia - likely acute blood loss on chronic. Agree with monitoring. Keep VAC dressing to incision until 7 days or discharge. If he is discharging, please let me know and I will remove the dressing. Follow up with me in 2-3 weeks for staple removal.   SUBJECTIVE: Quiet this morning. No complaints.  OBJECTIVE: BP (!) 152/73 (BP Location: Left Arm)    Pulse 95    Temp 98.7 F (37.1 C) (Oral)    Resp 18    Ht 5\' 6"  (1.676 m)    Wt 55.5 kg    SpO2 100%    BMI 19.75 kg/m   Intake/Output Summary (Last 24 hours) at 10/23/2021 0951 Last data filed at 10/23/2021 0800 Gross per 24 hour  Intake 1277.32 ml  Output 0 ml  Net 1277.32 ml    No distress LIJ TDC R stump protector in place VAC with good seal  CBC Latest Ref Rng & Units 10/23/2021 10/21/2021 10/20/2021  WBC 4.0 - 10.5 K/uL 13.2(H) 13.5(H) 11.2(H)  Hemoglobin 13.0 - 17.0 g/dL 7.0(L) 7.7(L) 8.3(L)  Hematocrit 39.0 - 52.0 % 21.7(L) 23.3(L) 25.8(L)  Platelets 150 - 400 K/uL 259 211 206     CMP Latest Ref Rng & Units 10/23/2021 10/21/2021 10/20/2021  Glucose 70 - 99 mg/dL 215(H) 202(H) 171(H)  BUN 8 - 23 mg/dL 40(H) 18 11  Creatinine 0.61 - 1.24 mg/dL 8.29(H) 4.44(H) 3.39(H)  Sodium 135 - 145 mmol/L 134(L) 132(L) 133(L)  Potassium 3.5 - 5.1 mmol/L 4.3 4.2 4.2  Chloride 98 - 111 mmol/L 95(L) 96(L) 97(L)  CO2 22 - 32 mmol/L 25 25 26   Calcium 8.9 - 10.3 mg/dL 9.3 9.0 8.7(L)  Total Protein 6.5 - 8.1 g/dL - - -  Total Bilirubin 0.3 - 1.2 mg/dL - - -  Alkaline Phos 38 - 126 U/L - - -  AST 15 - 41 U/L - - -  ALT 0 - 44 U/L - - -    Estimated Creatinine Clearance: 6.4 mL/min (A) (by C-G formula based on SCr of 8.29 mg/dL (H)).  [name]. Stanford Breed, MD [facility] Phone Number: [phone] 10/23/2021 9:51 AM

## 2021-10-24 DIAGNOSIS — Z89511 Acquired absence of right leg below knee: Secondary | ICD-10-CM

## 2021-10-24 DIAGNOSIS — D649 Anemia, unspecified: Secondary | ICD-10-CM | POA: Diagnosis not present

## 2021-10-24 LAB — RESP PANEL BY RT-PCR (FLU A&B, COVID) ARPGX2
Influenza A by PCR: NEGATIVE
Influenza B by PCR: NEGATIVE
SARS Coronavirus 2 by RT PCR: NEGATIVE

## 2021-10-24 LAB — CBC WITH DIFFERENTIAL/PLATELET
Abs Immature Granulocytes: 0.2 10*3/uL — ABNORMAL HIGH (ref 0.00–0.07)
Basophils Absolute: 0.1 10*3/uL (ref 0.0–0.1)
Basophils Relative: 0 %
Eosinophils Absolute: 0.3 10*3/uL (ref 0.0–0.5)
Eosinophils Relative: 3 %
HCT: 26.1 % — ABNORMAL LOW (ref 39.0–52.0)
Hemoglobin: 8.6 g/dL — ABNORMAL LOW (ref 13.0–17.0)
Immature Granulocytes: 2 %
Lymphocytes Relative: 13 %
Lymphs Abs: 1.7 10*3/uL (ref 0.7–4.0)
MCH: 30 pg (ref 26.0–34.0)
MCHC: 33 g/dL (ref 30.0–36.0)
MCV: 90.9 fL (ref 80.0–100.0)
Monocytes Absolute: 1.2 10*3/uL — ABNORMAL HIGH (ref 0.1–1.0)
Monocytes Relative: 10 %
Neutro Abs: 9.2 10*3/uL — ABNORMAL HIGH (ref 1.7–7.7)
Neutrophils Relative %: 72 %
Platelets: 261 10*3/uL (ref 150–400)
RBC: 2.87 MIL/uL — ABNORMAL LOW (ref 4.22–5.81)
RDW: 21.4 % — ABNORMAL HIGH (ref 11.5–15.5)
WBC: 12.7 10*3/uL — ABNORMAL HIGH (ref 4.0–10.5)
nRBC: 0.2 % (ref 0.0–0.2)

## 2021-10-24 LAB — GLUCOSE, CAPILLARY
Glucose-Capillary: 165 mg/dL — ABNORMAL HIGH (ref 70–99)
Glucose-Capillary: 159 mg/dL — ABNORMAL HIGH (ref 70–99)
Glucose-Capillary: 156 mg/dL — ABNORMAL HIGH (ref 70–99)
Glucose-Capillary: 269 mg/dL — ABNORMAL HIGH (ref 70–99)

## 2021-10-24 LAB — MAGNESIUM: Magnesium: 2 mg/dL (ref 1.7–2.4)

## 2021-10-24 LAB — RENAL FUNCTION PANEL
Albumin: 2.9 g/dL — ABNORMAL LOW (ref 3.5–5.0)
Anion gap: 13 (ref 5–15)
BUN: 23 mg/dL (ref 8–23)
CO2: 23 mmol/L (ref 22–32)
Calcium: 8.5 mg/dL — ABNORMAL LOW (ref 8.9–10.3)
Chloride: 97 mmol/L — ABNORMAL LOW (ref 98–111)
Creatinine, Ser: 4.74 mg/dL — ABNORMAL HIGH (ref 0.61–1.24)
GFR, Estimated: 12 mL/min — ABNORMAL LOW (ref >60)
Glucose, Bld: 145 mg/dL — ABNORMAL HIGH (ref 70–99)
Phosphorus: 2.2 mg/dL — ABNORMAL LOW (ref 2.5–4.6)
Potassium: 3.8 mmol/L (ref 3.5–5.1)
Sodium: 133 mmol/L — ABNORMAL LOW (ref 135–145)

## 2021-10-24 LAB — HEPARIN LEVEL (UNFRACTIONATED): Heparin Unfractionated: 0.44 IU/mL (ref 0.30–0.70)

## 2021-10-24 MED ORDER — PANTOPRAZOLE SODIUM 40 MG PO TBEC: 40.0000 mg | DELAYED_RELEASE_TABLET | Freq: Two times a day (BID) | ORAL | Status: DC

## 2021-10-24 MED ORDER — APIXABAN 5 MG PO TABS
5.0000 mg | ORAL_TABLET | Freq: Two times a day (BID) | ORAL | Status: DC
  Administered 2021-10-24: 5 mg via ORAL
  Filled 2021-10-24: qty 1

## 2021-10-24 MED ORDER — OXYCODONE HCL 5 MG PO TABS: 5.0000 mg | ORAL_TABLET | Freq: Three times a day (TID) | ORAL | 0 refills | Status: DC | PRN

## 2021-10-24 NOTE — TOC Transition Note (Signed)
Transition of Care [facility]) - CM/SW Discharge Note   Patient Details  Name: Roger Vazquez MRN: [id number] Date of Birth: 1950-12-31  Transition of Care [facility]) CM/SW Contact:  Milinda Antis, Bruce Phone Number: 10/24/2021, 2:09 PM   Clinical Narrative:    Patient will DC to:  SNF Anticipated DC date: 10/24/2021 Family notified:  yes  Transport by: Corey Harold   Per MD patient ready for DC to SNF. RN to call report prior to discharge [phone]. RN, patient, patient's family, and facility notified of DC. Discharge Summary and FL2 sent to facility. DC packet on chart. Ambulance transport requested for patient.   CSW will sign off for now as social work intervention is no longer needed. Please consult [place] again if new needs arise.     Final next level of care: Deputy Barriers to Discharge: No Barriers Identified   Patient Goals and CMS Choice        Discharge Placement              Patient chooses bed at: [place] Patient to be transferred to facility by: [place] Name of family member notified: Sel[place], Barrell (Sister)   [phone] Patient and family notified of of transfer: 10/24/21  Discharge Plan and Services                                     Social Determinants of Health (SDOH) Interventions     Readmission Risk Interventions Readmission Risk Prevention Plan 05/30/2021  Transportation Screening Complete  SW Recovery Care/Counseling Consult Complete  Skilled Nursing Facility Complete  Some recent data might be hidden

## 2021-10-24 NOTE — TOC Progression Note (Signed)
Transition of Care [facility]) - Initial/Assessment Note    Patient Details  Name: Roger Vazquez MRN: [id number] Date of Birth: Apr 21, 1951  Transition of Care [facility]) CM/SW Contact:    Milinda Antis, LCSWA Phone Number: 10/24/2021, 10:15 AM  Clinical Narrative:                 09:50-  CSW contacted Deirdre Pippins with admissions at [facility] SNF and asked if the facility could accept the patient today as he is LTC with the agency.  CSW was informed that they facility could accept pending a COVID screening.  CSW messaged the attending and RN and informed them of the need for a rapid COVID screening.   09:57-  CSW received a returned call from [place] with [facility] stating that insurance authorization would need to be submitted as the patient is now a double amputee.    CSW contacted [facility] and was informed that the patient is not managed by the agency and therefore the facility will need to begin insurance auth.    10:15-  CSW contacted Janie with [facility] and left a secured VM informing her that the facility would need to begin insurance authorization.         Patient Goals and CMS Choice        Expected Discharge Plan and Services           Expected Discharge Date: 10/24/21                                    Prior Living Arrangements/Services                       Activities of Daily Living Home Assistive Devices/Equipment: Wheelchair ADL Screening (condition at time of admission) Patient's cognitive ability adequate to safely complete daily activities?: No Is the patient deaf or have difficulty hearing?: No Does the patient have difficulty seeing, even when wearing glasses/contacts?: No Does the patient have difficulty concentrating, remembering, or making decisions?: No Patient able to express need for assistance with ADLs?: Yes Does the patient have difficulty dressing or bathing?: Yes Independently performs ADLs?: No Does the patient  have difficulty walking or climbing stairs?: Yes Weakness of Legs: Both Weakness of Arms/Hands: Both  Permission Sought/Granted                  Emotional Assessment              Admission diagnosis:  Symptomatic anemia [D64.9] Patient Active Problem List   Diagnosis Date Noted   Diverticulosis of colon without hemorrhage    Acute gastric ulcer without hemorrhage or perforation    Acute osteomyelitis of right calcaneus ([place])    Chronic anticoagulation    Heel ulcer, right, with unspecified severity ([place]) 10/13/2021   DVT (deep venous thrombosis) ([place]) 10/13/2021   Lymphadenopathy 10/13/2021   COVID-19 19/50/9326   Acute metabolic encephalopathy 71/24/5809   COVID-19 virus infection 08/12/2021   Peripheral vascular disease ([place]) 06/09/2021   S/P BKA (below knee amputation), left ([place]) 06/08/2021   Status post amputation of foot through metatarsal bone ([place])    Altered mental status 05/19/2021   Left foot infection    Alcohol abuse 05/16/2021   Erectile dysfunction 05/16/2021   Hydronephrosis 05/16/2021   Acute hepatitis C 05/16/2021   Diabetic retinopathy ([place]) 05/16/2021   History of hemodialysis 05/16/2021   Iron deficiency anemia  05/16/2021   Malignant neoplasm of upper lobe, left bronchus or lung ([place]) 05/16/2021   Noncompliance with medication regimen 05/16/2021   Non-small cell lung cancer ([place]) 05/16/2021   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   Vitreous hemorrhage, left eye ([place]) 05/16/2021   Cellulitis of left lower extremity    Gangrene ([place]) 04/27/2021   Diarrhea, unspecified 04/29/2019   Mild protein-calorie malnutrition ([place]) 08/20/2018   Fever    Infection of AV graft for dialysis ([place]) 08/05/2018   Symptomatic anemia 08/05/2018   Hypokalemia 08/05/2018   GERD (gastroesophageal reflux disease) 08/05/2018   Sepsis due to unspecified Staphylococcus ([place]) 08/02/2018   ESRD on hemodialysis ([place])    Bacteremia due to methicillin susceptible  Staphylococcus aureus (MSSA) 07/28/2018   Sepsis ([place]) 07/27/2018   Headache 01/14/2017   Fluid overload, unspecified 12/13/2015   Anemia in chronic kidney disease 12/09/2015   Chronic kidney disease, stage 5 ([place]) 12/09/2015   Coagulation defect, unspecified ([place]) 12/09/2015   Encounter for adjustment and management of vascular access device 12/09/2015   Other secondary pulmonary hypertension ([place]) 12/09/2015   Pruritus, unspecified 12/09/2015   Secondary hyperparathyroidism of renal origin ([place]) 12/09/2015   Shortness of breath 12/09/2015   Abdominal pain, other specified site 10/07/2011   Nausea and vomiting 10/07/2011   VITAMIN D DEFICIENCY 03/19/2008   FRACTURE, TIBIA 02/09/2008   HTN (hypertension) 11/25/2007   HEPATITIS C 05/27/2007   Type 2 diabetes mellitus with chronic kidney disease on chronic dialysis ([place]) 05/27/2007   ERECTILE DYSFUNCTION 05/27/2007   ABUSE, ALCOHOL, IN REMISSION 05/27/2007   ABUSE, OPIOID, IN REMISSION 05/27/2007   ABUSE, COCAINE, IN REMISSION 05/27/2007   PCP:  Clinic, [place]:   [facility] [place] [place] [address] Phone: [phone] Fax: [phone]     Social Determinants of Health (SDOH) Interventions    Readmission Risk Interventions Readmission Risk Prevention Plan 05/30/2021  Transportation Screening Complete  SW Recovery Care/Counseling Consult Complete  Skilled Nursing Facility Complete  Some recent data might be hidden

## 2021-10-24 NOTE — Progress Notes (Signed)
RN attempted to call [facility] three times. Secretary at [employer] said RN will call this Therapist, sports.

## 2021-10-24 NOTE — Progress Notes (Signed)
ANTICOAGULATION CONSULT NOTE - Follow Up Consult  Pharmacy Consult for Heparin > Apixban Indication:  hx DVT   Allergies  Allergen Reactions   Sildenafil Other (See Comments)    Pt stated, "Makes my Blood Pressure drop"    Patient Measurements: Height: 5\' 6"  (167.6 cm) Weight: 51 kg (112 lb 7 oz) IBW/kg (Calculated) : 63.8 Heparin Dosing Weight:51 kg  Vital Signs: Temp: 99.3 F (37.4 C) (01/10 0938) BP: 132/92 (01/10 0938) Pulse Rate: 93 (01/10 0938)  Labs: Recent Labs    10/22/21 0800 10/23/21 0613 10/24/21 0228  HGB  --  7.0* 8.6*  HCT  --  21.7* 26.1*  PLT  --  259 261  HEPARINUNFRC 0.34 0.37 0.44  CREATININE  --  8.29* 4.74*  ESRD  Assessment: 71 yo male with recent DVT (07/2021) on Apixaban PTA (last dose taken is unclear). Apixaban held due to symptomatic anemia, s/p endoscopy/colonoscopy on 1/3. Now on PPI for small gastric ulcer.  Pharmacy consulted to dose heparin while off Apixaban. Also s/p right BKA on 10/20/21.    To transition from IV heparin back to Apixaban. Heparin level therapeutic (0.44) on 1500 units/hr. Hgb improved.   Goal of Therapy:  Heparin level 0.3-0.5 units/ml Appropriate Eliquis dose for indication Monitor platelets by anticoagulation protocol: Yes   Plan:  Resume Eliquis 5 mg BID. IV heparin to stop when giving first Eliquis dose.  Arty Baumgartner, RPh 10/24/2021,10:25 AM

## 2021-10-24 NOTE — Progress Notes (Signed)
[facility] Progress Note   Subjective: Discharge orders noted. Next HD at [place].   Objective Vitals:   10/23/21 2112 10/24/21 0058 10/24/21 0429 10/24/21 0938  BP: (!) 152/70 (!) 148/67 (!) 155/70 (!) 132/92  Pulse: 98 93 92 93  Resp: 15 17 17 16   Temp: 98.4 F (36.9 C) 99.3 F (37.4 C) 98.8 F (37.1 C) 99.3 F (37.4 C)  TempSrc:      SpO2: 99% 99% 99% 98%  Weight:      Height:       Physical Exam General:chronically ill appearing male in NAD HEENT: Periorbital edema present.  Heart:RRR, no mrg Lungs: Bilateral breath sounds slightly decreased in bases with few bibasilar crackles. On RA.  Abdomen:soft, NTND Extremities:b/l BKA, R BKA with stump protector. no L stump edema but has edema on both hips, lower abdomen.  Dialysis Access: L IJ TDC, R AVG unuseable    Additional Objective Labs: Basic Metabolic Panel: Recent Labs  Lab 10/21/21 0457 10/23/21 0613 10/23/21 0630 10/24/21 0228  NA 132* 134*  --  133*  K 4.2 4.3  --  3.8  CL 96* 95*  --  97*  CO2 25 25  --  23  GLUCOSE 202* 215*  --  145*  BUN 18 40*  --  23  CREATININE 4.44* 8.29*  --  4.74*  CALCIUM 9.0 9.3  --  8.5*  PHOS  --   --  3.1 2.2*   Liver Function Tests: Recent Labs  Lab 10/23/21 0630 10/24/21 0228  ALBUMIN 2.9* 2.9*   No results for input(s): LIPASE, AMYLASE in the last 168 hours. CBC: Recent Labs  Lab 10/19/21 0718 10/20/21 1022 10/20/21 1941 10/21/21 0457 10/23/21 0613 10/24/21 0228  WBC 6.5  --  11.2* 13.5* 13.2* 12.7*  NEUTROABS 3.9  --  8.5* 10.8* 9.9* 9.2*  HGB 7.5*   < > 8.3* 7.7* 7.0* 8.6*  HCT 23.4*   < > 25.8* 23.3* 21.7* 26.1*  MCV 95.1  --  95.9 95.5 96.4 90.9  PLT 213  --  206 211 259 261   < > = values in this interval not displayed.   Blood Culture    Component Value Date/Time   SDES URINE, CLEAN CATCH 10/14/2021 1040   [place] 10/14/2021 1040   CULT  10/14/2021 1040    NO GROWTH Performed at [facility], [place] [address], [place], [place] [address]    REPTSTATUS 10/15/2021 FINAL 10/14/2021 1040    Cardiac Enzymes: No results for input(s): CKTOTAL, CKMB, CKMBINDEX, TROPONINI in the last 168 hours. CBG: Recent Labs  Lab 10/23/21 1136 10/23/21 1757 10/23/21 2112 10/24/21 0533 10/24/21 0722  GLUCAP 111* 174* 209* 165* 159*   Iron Studies: No results for input(s): IRON, TIBC, TRANSFERRIN, FERRITIN in the last 72 hours. @lablastinr3 @ Studies/Results: No results found. Medications:  sodium chloride 10 mL/hr (10/17/21 1018)    sodium chloride   Intravenous Once   amLODipine  10 mg Oral Daily   apixaban  5 mg Oral BID   calcium acetate  667 mg Oral TID WC   Chlorhexidine Gluconate Cloth  6 each Topical Q0600   Chlorhexidine Gluconate Cloth  6 each Topical Q0600   darbepoetin (ARANESP) injection - DIALYSIS  200 mcg Intravenous Q Fri-HD   feeding supplement (NEPRO CARB STEADY)  237 mL Oral TID BM   ferrous sulfate  325 mg Oral Q breakfast   insulin aspart  0-6 Units Subcutaneous TID WC  multivitamin  1 tablet Oral QHS   pantoprazole  40 mg Oral BID   polyethylene glycol  17 g Oral BID   senna  1 tablet Oral Daily   sodium chloride flush  3 mL Intravenous Q12H     Dialysis Orders: [facility]  on MWF . 180NRe, 3 hr 15 min, BFR 400, DFR A1.5, EDW 54kg, 2K 2Ca, TDC AVG with suture intact. no heparin Mircera 283mcg IV q HD- last dose was 173mcg on 10/19 Calcium acetate 667mg  1 tab TID with meals Tums 200mg - 2 tabs PO BID   Assessment/Plan: Symptomatic anemia: GIB suspected, GI following. Pt currently on PPI, Eliquis on hold.  Endoscopy on 10/17/21 with 1 non-bleeding superficial gastric ulcer w/no stigmata of bleeding in the prepyloric region, exam otherwise normal. S/P 1 unit PRBCs 10/13/2021. Hgb today 8.6 today. Transfused 1 unit PRBCS in HD 10/23/2021. Aranesp ^ 29mcg qwk.  R heel ulceration w/Osteomyelitis. Seen by ID. No ABX. R BKA 10/20/21 by Dr. Stanford Breed.   ESRD:  Dialysis on MWF schedule. Next  HD on 10/25/21.   Hypertension/volume: BP elevated this AM. Continue home meds - amlodipine 10mg  and prn hydralazine. Under dry weight with additional weight loss following surgery. Will need lower EDW to 51 kg on DC.   Metabolic bone disease: Corrected calcium in high 9s.  PO4 2.2. Hold binders.   Nutrition:  Renal diet. Low albumin in setting of Osteo. +protein supplements. Encouraged nutrition.  Malfunctioning AVG: Went to [facility] and shuntogram earlier in December 2022. Needs referral to [facility] for new access when he is stable.     Katharin Schneider H. Naziya Hegwood NP-C 10/24/2021, 11:27 AM  [employer] [phone]

## 2021-10-24 NOTE — NC FL2 (Signed)
[place] LEVEL OF CARE SCREENING TOOL     IDENTIFICATION  Patient Name: Roger Vazquez Birthdate: May 13, 1951 Sex: male Admission Date (Current Location): 10/12/2021  [facility] [place] Number:  Herbalist and Address:  [facility], [place] [address], [place], [place] [address]      Provider Number: [id number]  Attending Physician Name and Address:  Antonieta Pert, MD  Relative Name and Phone Number:  Kijana, Cromie (Sister)   [phone]    Current Level of Care: Hospital Recommended Level of Care: [place] Prior Approval Number:    Date Approved/Denied:   PASRR Number: [id number]  Discharge Plan: SNF    Current Diagnoses: Patient Active Problem List   Diagnosis Date Noted   Diverticulosis of colon without hemorrhage    Acute gastric ulcer without hemorrhage or perforation    Acute osteomyelitis of right calcaneus [facility])    Chronic anticoagulation    Heel ulcer, right, with unspecified severity ([place]) 10/13/2021   DVT (deep venous thrombosis) ([place]) 10/13/2021   Lymphadenopathy 10/13/2021   COVID-19 66/44/0347   Acute metabolic encephalopathy 42/59/5638   COVID-19 virus infection 08/12/2021   Peripheral vascular disease ([place]) 06/09/2021   S/P BKA (below knee amputation), left ([place]) 06/08/2021   Status post amputation of foot through metatarsal bone ([place])    Altered mental status 05/19/2021   Left foot infection    Alcohol abuse 05/16/2021   Erectile dysfunction 05/16/2021   Hydronephrosis 05/16/2021   Acute hepatitis C 05/16/2021   Diabetic retinopathy ([place]) 05/16/2021   History of hemodialysis 05/16/2021   Iron deficiency anemia 05/16/2021   Malignant neoplasm of upper lobe, left bronchus or lung ([place]) 05/16/2021   Noncompliance with medication regimen 05/16/2021   Non-small cell lung cancer ([place]) 05/16/2021   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   Vitreous hemorrhage, left eye ([place])  05/16/2021   Cellulitis of left lower extremity    Gangrene ([place]) 04/27/2021   Diarrhea, unspecified 04/29/2019   Mild protein-calorie malnutrition ([place]) 08/20/2018   Fever    Infection of AV graft for dialysis ([place]) 08/05/2018   Symptomatic anemia 08/05/2018   Hypokalemia 08/05/2018   GERD (gastroesophageal reflux disease) 08/05/2018   Sepsis due to unspecified Staphylococcus ([place]) 08/02/2018   ESRD on hemodialysis ([place])    Bacteremia due to methicillin susceptible Staphylococcus aureus (MSSA) 07/28/2018   Sepsis ([place]) 07/27/2018   Headache 01/14/2017   Fluid overload, unspecified 12/13/2015   Anemia in chronic kidney disease 12/09/2015   Chronic kidney disease, stage 5 ([place]) 12/09/2015   Coagulation defect, unspecified ([place]) 12/09/2015   Encounter for adjustment and management of vascular access device 12/09/2015   Other secondary pulmonary hypertension ([place]) 12/09/2015   Pruritus, unspecified 12/09/2015   Secondary hyperparathyroidism of renal origin ([place]) 12/09/2015   Shortness of breath 12/09/2015   Abdominal pain, other specified site 10/07/2011   Nausea and vomiting 10/07/2011   VITAMIN D DEFICIENCY 03/19/2008   FRACTURE, TIBIA 02/09/2008   HTN (hypertension) 11/25/2007   HEPATITIS C 05/27/2007   Type 2 diabetes mellitus with chronic kidney disease on chronic dialysis ([place]) 05/27/2007   ERECTILE DYSFUNCTION 05/27/2007   ABUSE, ALCOHOL, IN REMISSION 05/27/2007   ABUSE, OPIOID, IN REMISSION 05/27/2007   ABUSE, COCAINE, IN REMISSION 05/27/2007    Orientation RESPIRATION BLADDER Height & Weight     Self, Time, Situation, Place  Normal Continent Weight: 112 lb 7 oz (51 kg) Height:  5\' 6"  (167.6 cm)  BEHAVIORAL SYMPTOMS/MOOD NEUROLOGICAL BOWEL NUTRITION  STATUS      Incontinent Diet (see d/c summary)  AMBULATORY STATUS COMMUNICATION OF NEEDS Skin   Total Care Verbally Surgical wounds                       Personal Care Assistance Level of Assistance  Bathing,  Feeding, Dressing Bathing Assistance: Maximum assistance Feeding assistance: Independent Dressing Assistance: Maximum assistance     Functional Limitations Info             SPECIAL CARE FACTORS FREQUENCY  PT (By licensed PT), OT (By licensed OT)     PT Frequency: 5x/ week OT Frequency: 5x/ week            Contractures Contractures Info: Not present    Additional Factors Info  Code Status, Allergies, Insulin Sliding Scale, Isolation Precautions Code Status Info: Full Allergies Info: Sildenafil   Insulin Sliding Scale Info: see d/c summary Isolation Precautions Info: MRSA     Current Medications (10/24/2021):  This is the current hospital active medication list Current Facility-Administered Medications  Medication Dose Route Frequency Provider Last Rate Last Admin   0.9 %  sodium chloride infusion (Manually program via Guardrails IV Fluids)   Intravenous Once Dwyane Dee, MD       0.9 %  sodium chloride infusion  10 mL/hr Intravenous Once Dagoberto Ligas, PA-C 10 mL/hr at 10/17/21 1018 Continued from Pre-op at 10/17/21 1018   acetaminophen (TYLENOL) tablet 650 mg  650 mg Oral Q6H PRN Dagoberto Ligas, PA-C   650 mg at 10/21/21 0423   Or   acetaminophen (TYLENOL) suppository 650 mg  650 mg Rectal Q6H PRN Dagoberto Ligas, PA-C       albuterol (PROVENTIL) (2.5 MG/3ML) 0.083% nebulizer solution 2.5 mg  2.5 mg Nebulization Q2H PRN Dagoberto Ligas, PA-C       amLODipine (NORVASC) tablet 10 mg  10 mg Oral Daily Dagoberto Ligas, PA-C   10 mg at 10/24/21 0825   apixaban (ELIQUIS) tablet 5 mg  5 mg Oral BID Skeet Simmer, RPH   5 mg at 10/24/21 1019   calcium acetate (PHOSLO) capsule 667 mg  667 mg Oral TID WC Dagoberto Ligas, PA-C   667 mg at 10/24/21 0825   calcium carbonate (dosed in mg elemental calcium) suspension 500 mg of elemental calcium  500 mg of elemental calcium Oral Q6H PRN Dagoberto Ligas, PA-C       camphor-menthol [facility]) lotion 1 application  1  application Topical X7D PRN Dagoberto Ligas, PA-C       And   hydrOXYzine (ATARAX) tablet 25 mg  25 mg Oral Q8H PRN Dagoberto Ligas, PA-C   25 mg at 10/17/21 2132   Chlorhexidine Gluconate Cloth 2 % PADS 6 each  6 each Topical Q0600 Dagoberto Ligas, PA-C   6 each at 10/23/21 5329   Chlorhexidine Gluconate Cloth 2 % PADS 6 each  6 each Topical Q0600 Penninger, Ria Comment, [place]   6 each at 10/24/21 9242   Darbepoetin Alfa (ARANESP) injection 200 mcg  200 mcg Intravenous Q Fri-HD Dagoberto Ligas, PA-C   200 mcg at 10/20/21 1640   docusate sodium (ENEMEEZ) enema 283 mg  1 enema Rectal PRN Dagoberto Ligas, PA-C       feeding supplement (NEPRO CARB STEADY) liquid 237 mL  237 mL Oral TID BM Dagoberto Ligas, PA-C   237 mL at 10/23/21 0845   ferrous sulfate tablet 325 mg  325 mg Oral Q breakfast Dagoberto Ligas, PA-C  325 mg at 10/24/21 0825   heparin ADULT infusion 100 units/mL (25000 units/250mL)  1,500 Units/hr Intravenous Continuous Skeet Simmer, RPH 15 mL/hr at 10/24/21 0014 1,500 Units/hr at 10/24/21 0014   hydrALAZINE (APRESOLINE) tablet 25 mg  25 mg Oral Q4H PRN Dagoberto Ligas, PA-C       HYDROmorphone (DILAUDID) injection 0.5 mg  0.5 mg Intravenous Q3H PRN Dagoberto Ligas, PA-C   0.5 mg at 10/23/21 2105   insulin aspart (novoLOG) injection 0-6 Units  0-6 Units Subcutaneous TID WC Dagoberto Ligas, PA-C   1 Units at 10/24/21 0825   labetalol (NORMODYNE) injection 10 mg  10 mg Intravenous Q4H PRN Dwyane Dee, MD       lip balm (CARMEX) ointment   Topical PRN Dagoberto Ligas, PA-C   75 application at 29/47/65 1523   multivitamin (RENA-VIT) tablet 1 tablet  1 tablet Oral QHS Dagoberto Ligas, PA-C   1 tablet at 10/22/21 2312   ondansetron (ZOFRAN) tablet 4 mg  4 mg Oral Q6H PRN Dagoberto Ligas, PA-C       Or   ondansetron (ZOFRAN) injection 4 mg  4 mg Intravenous Q6H PRN Dagoberto Ligas, PA-C       oxyCODONE (Oxy IR/ROXICODONE) immediate release tablet 5 mg  5 mg Oral Q6H PRN Dagoberto Ligas, PA-C   5 mg at 10/24/21 1019   pantoprazole (PROTONIX) EC tablet 40 mg  40 mg Oral BID Dagoberto Ligas, PA-C   40 mg at 10/24/21 0825   polyethylene glycol (MIRALAX / GLYCOLAX) packet 17 g  17 g Oral BID Dagoberto Ligas, PA-C   17 g at 10/22/21 1659   senna (SENOKOT) tablet 8.6 mg  1 tablet Oral Daily Dagoberto Ligas, PA-C   8.6 mg at 10/24/21 0825   sodium chloride flush (NS) 0.9 % injection 3 mL  3 mL Intravenous Q12H Dagoberto Ligas, PA-C   3 mL at 10/23/21 2116   sorbitol 70 % solution 30 mL  30 mL Oral PRN Dagoberto Ligas, PA-C       zolpidem (AMBIEN) tablet 5 mg  5 mg Oral QHS PRN Dagoberto Ligas, PA-C   5 mg at 10/21/21 2144     Discharge Medications: Please see discharge summary for a list of discharge medications.  Relevant Imaging Results:  Relevant Lab Results:   Additional Information [phone] HD Mon, Wed, Friday, Both Covid Vaccines  Rikki Trosper F Jesseka Drinkard, [place]

## 2021-10-24 NOTE — Progress Notes (Signed)
Contacted [facility] to make clinic aware pt to return to [facility] today and pt will resume care tomorrow.   Melven Sartorius Renal Navigator [phone]

## 2021-10-24 NOTE — Progress Notes (Signed)
Report given to [facility] nurse.

## 2021-10-24 NOTE — Discharge Instructions (Signed)
Information on my medicine - ELIQUIS (apixaban)  Why was Eliquis prescribed for you? Eliquis was prescribed to treat blood clots that may have been found in the veins of your legs (deep vein thrombosis) or in your lungs (pulmonary embolism) and to reduce the risk of them occurring again.  What do You need to know about Eliquis ?  Your current dose dose ONE 5 mg tablet taken TWICE daily.  Eliquis may be taken with or without food.   Try to take the dose about the same time in the morning and in the evening. If you have difficulty swallowing the tablet whole please discuss with your pharmacist how to take the medication safely.  Take Eliquis exactly as prescribed and DO NOT stop taking Eliquis without talking to the doctor who prescribed the medication.  Stopping may increase your risk of developing a new blood clot.  Refill your prescription before you run out.  After discharge, you should have regular check-up appointments with your healthcare provider that is prescribing your Eliquis.    What do you do if you miss a dose? If a dose of ELIQUIS is not taken at the scheduled time, take it as soon as possible on the same day and twice-daily administration should be resumed. The dose should not be doubled to make up for a missed dose.  Important Safety Information A possible side effect of Eliquis is bleeding. You should call your healthcare provider right away if you experience any of the following: Bleeding from an injury or your nose that does not stop. Unusual colored urine (red or dark brown) or unusual colored stools (red or black). Unusual bruising for unknown reasons. A serious fall or if you hit your head (even if there is no bleeding).  Some medicines may interact with Eliquis and might increase your risk of bleeding or clotting while on Eliquis. To help avoid this, consult your healthcare provider or pharmacist prior to using any new prescription or non-prescription  medications, including herbals, vitamins, non-steroidal anti-inflammatory drugs (NSAIDs) and supplements.  This website has more information on Eliquis (apixaban): http://www.eliquis.com/eliquis/home

## 2021-10-24 NOTE — Progress Notes (Signed)
Physical Therapy Treatment Patient Details Name: Roger Vazquez MRN: [id number] DOB: July 08, 1951 Today's Date: 10/24/2021   History of Present Illness 71 y.o. male presented to the ED 10/12/21 with symptomatic anemia (HGB 6.4). EGD/colonoscopy 1/3. 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 PMH significant of ESRD on HD MWF, hypertension, non-insulin-dependent type 2 diabetes, history of lung cancer, PAD, polysubstance abuse, left BKA, Hep C, covid 11/22, DVT 10/22    PT Comments    Pt agreeable to participate in physical therapy session. Requiring two person moderate assist for bed mobility with use of bed pad for maneuvering/scooting hips. Worked on sitting balance edge of bed with focus on anterior weight shift, with pt progressing to supervision level towards end of session. Presents with generalized weakness, right residual limb weakness in setting of recent amputation, decreased sitting balance, and cognition. Recommend SNF to address deficits and maximize functional mobility.     Recommendations for follow up therapy are one component of a multi-disciplinary discharge planning process, led by the attending physician.  Recommendations may be updated based on patient status, additional functional criteria and insurance authorization.  Follow Up Recommendations  Skilled nursing-short term rehab (<3 hours/day)     Assistance Recommended at Discharge Frequent or constant Supervision/Assistance  Patient can return home with the following Two people to help with walking and/or transfers;Assistance with cooking/housework;Direct supervision/assist for medications management;Direct supervision/assist for financial management;Assist for transportation;Help with stairs or ramp for entrance   Equipment Recommendations  Wheelchair (measurements PT);Wheelchair cushion (measurements PT)    Recommendations for Other Services OT consult     Precautions /  Restrictions Precautions Precautions: Fall Required Braces or Orthoses: Other Brace Other Brace: rt BKA limb guard Restrictions Weight Bearing Restrictions: Yes RLE Weight Bearing: Non weight bearing     Mobility  Bed Mobility Overal bed mobility: Needs Assistance Bed Mobility: Sit to Supine;Supine to Sit Rolling:  (with rail)   Supine to sit: Mod assist;+2 for physical assistance Sit to supine: Mod assist;+2 for physical assistance   General bed mobility comments: cues for use of rail, assist at hips with bed pad, modA + 2 for safety    Transfers                        Ambulation/Gait                   Stairs             Wheelchair Mobility    Modified Rankin (Stroke Patients Only)       Balance Overall balance assessment: Needs assistance Sitting-balance support: Bilateral upper extremity supported;Feet unsupported Sitting balance-Leahy Scale: Poor Sitting balance - Comments: posterior bias, with HHA, was able to pull forward. progressing to close supervision towards end                                    Cognition Arousal/Alertness: Awake/alert Behavior During Therapy: Flat affect Overall Cognitive Status: No family/caregiver present to determine baseline cognitive functioning                                 General Comments: slow processing, increased time to follow commands, requires repetition        Exercises Amputee Exercises Hip Flexion/Marching: Both;5 reps;Seated Knee Extension: Both;5 reps;Seated  General Comments        Pertinent Vitals/Pain Pain Assessment: Faces Faces Pain Scale: Hurts little more Pain Location: R residual limb with movement Pain Descriptors / Indicators: Discomfort;Moaning Pain Intervention(s): Monitored during session    Home Living                          Prior Function            PT Goals (current goals can now be found in the care plan  section) Acute Rehab PT Goals Patient Stated Goal: did not state PT Goal Formulation: With patient Time For Goal Achievement: 11/04/21 Potential to Achieve Goals: Fair Progress towards PT goals: Progressing toward goals    Frequency    Min 2X/week      PT Plan Current plan remains appropriate    Co-evaluation              AM-PAC PT "6 Clicks" Mobility   Outcome Measure  Help needed turning from your back to your side while in a flat bed without using bedrails?: A Little Help needed moving from lying on your back to sitting on the side of a flat bed without using bedrails?: A Lot Help needed moving to and from a bed to a chair (including a wheelchair)?: Total Help needed standing up from a chair using your arms (e.g., wheelchair or bedside chair)?: Total Help needed to walk in hospital room?: Total Help needed climbing 3-5 steps with a railing? : Total 6 Click Score: 9    End of Session   Activity Tolerance: Patient tolerated treatment well Patient left: in bed;with call bell/phone within reach;with bed alarm set Nurse Communication: Mobility status PT Visit Diagnosis: Muscle weakness (generalized) (M62.81);Difficulty in walking, not elsewhere classified (R26.2);Other (comment) (limited mobility due to amputation)     Time: 1030-1046 PT Time Calculation (min) (ACUTE ONLY): 16 min  Charges:  $Therapeutic Activity: 8-22 mins                     Wyona Almas, PT, DPT Acute Rehabilitation Services Pager [phone] Office [phone]    Deno Etienne 10/24/2021, 11:19 AM

## 2021-10-24 NOTE — Progress Notes (Signed)
Prevena dressing removed at bedside. Incision appears healthy. Re-dressed with compression sock. Re-apply stump protector when patient's pain better controlled. Follow up with me in 2-3 weeks for staple removal.  Roger Vazquez. Stanford Breed, MD [facility] Phone Number: [phone] 10/24/2021 1:12 PM

## 2021-10-24 NOTE — Discharge Summary (Addendum)
Physician Discharge Summary  Roger Vazquez WNI:[id number] DOB: [birth year] DOA: 10/12/2021  PCP: [facility]  Admit date: 10/12/2021 Discharge date: 10/24/2021  Admitted From: SNF Disposition:  SNF  Recommendations for Outpatient Follow-up:  Follow up with PCP and vascular in 1-2 weeks F/u with HD clinc MWF Please obtain BMP/CBC in one week   Home Health:no  Equipment/Devices: none  Discharge Condition: Stable Code Status:   Code Status: Full Code Diet recommendation:  Diet Order             Diet renal with fluid restriction Fluid restriction: 1200 mL Fluid; Room service appropriate? Yes; Fluid consistency: Thin  Diet effective now                    Brief/Interim Summary: 71 yo male with PMH ESRD on HD MWF, PAD s/p LBKA, DMII, HTN, anemia of chronic disease who presented with a hemoglobin of 6.4 g/dL from his SNF and also reported to be feeling weak and tired.  Patient was admitted, he underwent evaluation with GI regarding his anemia with EGD and colonoscopy.  EGD showed small gastric ulcer and he was continued on a PPI.  Colonoscopy showed diverticulosis. He also had an ongoing wound to his right heel.  He underwent further evaluation with MRI of the right foot which showed osteomyelitis involving the calcaneus.  He was evaluated by vascular surgery recommendations for a right BKA which was performed on 10/20/2021 Wound was managed by vascular with wound VAC, underwent scheduled dialysis per nephrology Monday Wednesday Friday.  His Eliquis was held and placed on heparin drip. Hemoglobin was low and underwent further blood transfusion 1/9, with improvement in hemoglobin, overall patient is clinically stable for discharge back to the facility.  He does feel depressed in the setting of acute illness will benefit outpatient follow-up with his doctor.  Discharge Diagnoses:   Symptomatic anemia:Hgb 12.6 on 11/2 and 6.6 g/dL on admission -s/p EGD (small gastric ulcer)  and CLN (diverticulosis) -Recommended indefinite PPI daily for gastric ulcer. 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 Is WC bound, already had LBKA and does not use a prosthesis. on VAC dressing in place -removing prior to discharge outpatient follow-up in 2 to 3 weeks for staple removal   ESRD on hemodialysis  MWF HD Metabolic bone disease:Continue Phoslo   Acute gastric ulcer without hemorrhage or perforation:Continue daily PPI indefinitely   DVT -Duplex (10/16/21) notes chronic DVT of SF junction -Has been on Eliquis-resume Eliquis on discharge discontinue heparin   Lymphadenopathy:e is due for lymph node biopsy in the axillary region on 1/3 at the [place]   HTN : Stable on Norvasc.     T2DM,Prior A1c was 7.9% on 04/27/21 - repeat A1c now 6.6% on 10/18/21  Consults: Vascular surgery  Subjective: Alert awake oriented this morning not much interactive but when discussed about going back to the facility he agrees.  Has no complaints Discharge Exam: Vitals:   10/24/21 0429 10/24/21 0938  BP: (!) 155/70 (!) 132/92  Pulse: 92 93  Resp: 17 16  Temp: 98.8 F (37.1 C) 99.3 F (37.4 C)  SpO2: 99% 98%   General: Pt is alert, awake, not  in acute distress Cardiovascular: RRR, S1/S2 +, no rubs, no gallops Respiratory: CTA bilaterally, no wheezing, no rhonchi Abdominal: Soft, NT, ND, bowel sounds +,  Extremities: no edema, no cyanosis.bilateral BKA  Discharge Instructions  Discharge Instructions     Discharge instructions   Complete by: As directed    CBC BMP in 1 week. Follow-up with vascular surgery in 2 to 3 weeks for staple removal continue wound care as  per vascular   Discharge wound care:   Complete by: As directed    Foam dressing to right heel, change Q 3 days or PRN soiling.  Please follow additional wound instruction for vascular.  Follow-up to vascular clinic for staple removal in 2 weeks   Increase activity slowly   Complete by: As directed       Allergies as of 10/24/2021       Reactions   Sildenafil Other (See Comments)   Pt stated, "Makes my Blood Pressure drop"        Medication List     TAKE these medications    acetaminophen 325 MG tablet Commonly known as: TYLENOL Take 650 mg by mouth every 6 (six) hours.   amLODipine 10 MG tablet Commonly known as: NORVASC Take 1 tablet (10 mg total) by mouth daily.   apixaban 5 MG Tabs tablet Commonly known as: ELIQUIS Take 1 tablet (5 mg total) by mouth 2 (two) times daily. What changed: Another medication with the same name was removed. Continue taking this medication, and follow the directions you see here.   aspirin 81 MG chewable tablet Chew 81 mg by mouth daily. (0800)   calcium acetate 667 MG capsule Commonly known as: PHOSLO Take 667 mg by mouth 3 (three) times daily with meals. (0800, 1300 & 1700)   ferrous sulfate 325 (65 FE) MG tablet Take 325 mg by mouth daily with breakfast.   hydrocortisone 25 MG suppository Commonly known as: ANUSOL-HC Place 25 mg rectally 2 (two) times daily as needed for hemorrhoids or anal itching.   lidocaine-prilocaine cream Commonly known as: EMLA Apply 1 application topically See admin instructions. As needed on dialysis days.(Monday,Wednesday and Friday)   multivitamin with minerals Tabs tablet Take 1 tablet by mouth daily.   NOVASOURCE RENAL PO Take 237 mLs by mouth daily.   oxyCODONE 5 MG immediate release tablet Commonly known as: Oxy IR/ROXICODONE Take 1 tablet (5 mg total) by mouth 3 (three) times daily as needed for up to 4 doses for severe pain. What changed: when to take this   pantoprazole 40 MG  tablet Commonly known as: PROTONIX Take 1 tablet (40 mg total) by mouth 2 (two) times daily.   polyethylene glycol 17 g packet Commonly known as: MIRALAX / GLYCOLAX Take 17 g by mouth 2 (two) times daily.   senna 8.6 MG Tabs tablet Commonly known as: SENOKOT Take 1 tablet (8.6 mg total) by mouth daily.               Discharge Care Instructions  (From admission, onward)           Start     Ordered   10/24/21 0000  Discharge wound care:       Comments: Foam dressing to right heel, change Q 3 days or PRN soiling.  Please follow additional wound instruction for vascular.  Follow-up to vascular clinic for staple removal in 2 weeks   10/24/21 1005            Allergies  Allergen Reactions  Sildenafil Other (See Comments)    Pt stated, "Makes my Blood Pressure drop"    The results of significant diagnostics from this hospitalization (including imaging, microbiology, ancillary and laboratory) are listed below for reference.    Microbiology: Recent Results (from the past 240 hour(s))  Resp Panel by RT-PCR (Flu A&B, Covid) Nasopharyngeal Swab     Status: None   Collection Time: 10/24/21 10:00 AM   Specimen: Nasopharyngeal Swab; Nasopharyngeal(NP) swabs in vial transport medium  Result Value Ref Range Status   SARS Coronavirus 2 by RT PCR NEGATIVE NEGATIVE Final    Comment: (NOTE) SARS-CoV-2 target nucleic acids are NOT DETECTED.  The SARS-CoV-2 RNA is generally detectable in upper respiratory specimens during the acute phase of infection. The lowest concentration of SARS-CoV-2 viral copies this assay can detect is 138 copies/mL. A negative result does not preclude SARS-Cov-2 infection and should not be used as the sole basis for treatment or other patient management decisions. A negative result may occur with  improper specimen collection/handling, submission of specimen other than nasopharyngeal swab, presence of viral mutation(s) within the areas targeted by  this assay, and inadequate number of viral copies(<138 copies/mL). A negative result must be combined with clinical observations, patient history, and epidemiological information. The expected result is Negative.  Fact Sheet for Patients:  EntrepreneurPulse.com.au  Fact Sheet for Healthcare Providers:  IncredibleEmployment.be  This test is no t yet approved or cleared by the [place] FDA and  has been authorized for detection and/or diagnosis of SARS-CoV-2 by FDA under an Emergency Use Authorization (EUA). This EUA will remain  in effect (meaning this test can be used) for the duration of the COVID-19 declaration under Section 564(b)(1) of the Act, 21 U.S.C.section 360bbb-3(b)(1), unless the authorization is terminated  or revoked sooner.       Influenza A by PCR NEGATIVE NEGATIVE Final   Influenza B by PCR NEGATIVE NEGATIVE Final    Comment: (NOTE) The Xpert Xpress SARS-CoV-2/FLU/RSV plus assay is intended as an aid in the diagnosis of influenza from Nasopharyngeal swab specimens and should not be used as a sole basis for treatment. Nasal washings and aspirates are unacceptable for Xpert Xpress SARS-CoV-2/FLU/RSV testing.  Fact Sheet for Patients: EntrepreneurPulse.com.au  Fact Sheet for Healthcare Providers: IncredibleEmployment.be  This test is not yet approved or cleared by the [place] FDA and has been authorized for detection and/or diagnosis of SARS-CoV-2 by FDA under an Emergency Use Authorization (EUA). This EUA will remain in effect (meaning this test can be used) for the duration of the COVID-19 declaration under Section 564(b)(1) of the Act, 21 U.S.C. section 360bbb-3(b)(1), unless the authorization is terminated or revoked.  Performed at [facility], [place] [address] [place], [place] [address]     Procedures/Studies: CT ABDOMEN PELVIS WO CONTRAST  Result Date:  10/14/2021 CLINICAL DATA:  6 gram drop in hemoglobin. Assess for intra-abdominal or retroperitoneal bleed. EXAM: CT ABDOMEN AND PELVIS WITHOUT CONTRAST TECHNIQUE: Multidetector CT imaging of the abdomen and pelvis was performed following the standard protocol without IV contrast. COMPARISON:  October 07, 2011 FINDINGS: Lower chest: Mild atelectasis of posterior lung bases are noted. The heart size is mildly enlarged. Hepatobiliary: No focal liver abnormality is seen. No gallstones, gallbladder wall thickening, or biliary dilatation. Pancreas: Unremarkable. No pancreatic ductal dilatation or surrounding inflammatory changes. Spleen: Normal in size without focal abnormality. Adrenals/Urinary Tract: Left parapelvic renal cysts is identified. Nonobstructing stone is identified in the left kidney. There is no focal obstructing  stone or dilatation of left ureter. Small stone is identified in the right kidney. There is no right hydronephrosis. The bladder is markedly distended and enlarged with thickened bladder wall. There is inflammation surrounding bladder. Stomach/Bowel: Stomach is within normal limits. Appendix is not seen but no inflammation is noted around cecum. No evidence of bowel wall thickening, distention, or inflammatory changes. There is diverticulosis of colon without diverticulitis. Vascular/Lymphatic: Aortic atherosclerosis. No enlarged abdominal or pelvic lymph nodes. Reproductive: Prostate is unremarkable. Other: No evidence of retroperitoneal or intra-abdominal hemorrhage. Musculoskeletal: Minimal degenerative joint changes of the spine are noted. IMPRESSION: 1. Markedly distended and enlarged bladder with thickened bladder wall and inflammation surrounding bladder. Findings are consistent with cystitis. 2. No evidence of retroperitoneal hemorrhage. Aortic Atherosclerosis (ICD10-I70.0). Electronically Signed   By: Abelardo Diesel M.D.   On: 10/14/2021 10:00   MR FOOT RIGHT WO CONTRAST  Result Date:  10/17/2021 CLINICAL DATA:  Limping, acute foot pain, infection suspected. Evaluate right heel ulcer history of diabetes. EXAM: MRI OF THE RIGHT FOREFOOT WITHOUT CONTRAST TECHNIQUE: Multiplanar, multisequence MR imaging of the right hindfoot was performed. No intravenous contrast was administered. COMPARISON:  Radiographs dated October 12, 2021 FINDINGS: Bones/Joint/Cartilage T1 hypointense and hyperintense signal on STIR and T2 sequences on the lateral aspect of the calcaneus which in the presence of adjacent deep skin wound is highly suspicious for acute osteomyelitis. There is susceptibility artifact from hardware in the distal tibia. 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 No drainable fluid collection or abscess. The tendons of the flexor, extensor and peroneal compartments are intact. Mild tendinopathy of the Achilles tendon without tear. Soft tissues Skin irregularity about the plantar aspect of the calcaneus consistent with skin wound without evidence of drainable fluid collection or abscess. Mild subcutaneous soft tissue edema about the plantar soft tissues. IMPRESSION: 1. Abnormal marrow signal of the lateral aspect of the calcaneus which in the presence of adjacent deep skin wound is highly suspicious for acute osteomyelitis. 2. 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 No drainable fluid collection or abscess. Electronically Signed   By: Keane Police D.O.   On: 10/17/2021 08:11   DG Foot Complete Right  Result Date: 10/12/2021 CLINICAL DATA:  Right foot pain EXAM: RIGHT FOOT COMPLETE - 3+ VIEW COMPARISON:  None. FINDINGS: Diffuse bone demineralization. No evidence of acute fracture or dislocation. No focal bone lesion or bone destruction. No radiographic evidence of osteomyelitis. Prominent vascular calcifications. Postoperative  intramedullary rod in the tibia. Incompletely visualized. No soft tissue gas or foreign body. IMPRESSION: Diffuse bone demineralization. No acute bony abnormalities. Prominent vascular calcifications. Electronically Signed   By: Lucienne Capers M.D.   On: 10/12/2021 23:51   VAS [place] ABI WITH/WO TBI  Result Date: 10/10/2021  LOWER EXTREMITY DOPPLER STUDY Patient Name:  ASKARI KINLEY  Date of Exam:   10/10/2021 Medical Rec #: [id number]         Accession #:    [id number] Date of Birth: 06/02/1951         Patient Gender: M Patient Age:   7 years Exam Location:  [facility] Procedure:      VAS [place] ABI WITH/WO TBI Referring Phys: Jamelle Haring --------------------------------------------------------------------------------  Indications: Ulceration, and peripheral artery disease. High Risk Factors: Hypertension, Diabetes, past history of smoking.  Vascular  06/08/2021: Left BKA. Interventions:                        04/28/2021: Left popliteal and posterior tibial                        angioplasty. Limitations: Today's exam was limited due to patient in wheelchair. Comparison Study: 04/27/2021: Rt ABI [place]; Lt ABI [place] Performing Technologist: Ivan Croft  Examination Guidelines: A complete evaluation includes at minimum, Doppler waveform signals and systolic blood pressure reading at the level of bilateral brachial, anterior tibial, and posterior tibial arteries, when vessel segments are accessible. Bilateral testing is considered an integral part of a complete examination. Photoelectric Plethysmograph (PPG) waveforms and toe systolic pressure readings are included as required and additional duplex testing as needed. Limited examinations for reoccurring indications may be performed as noted.  ABI Findings: +---------+------------------+-----+----------+---------+  Right     Rt Pressure (mmHg) Index Waveform   Comment    +---------+------------------+-----+----------+---------+  Brachial                                       HD access  +---------+------------------+-----+----------+---------+  PTA       >230               [place]    monophasic            +---------+------------------+-----+----------+---------+  DP        >230               [place]    monophasic            +---------+------------------+-----+----------+---------+  [place] 113                0.67                        +---------+------------------+-----+----------+---------+ +--------+------------------+-----+--------+-------+  Left     Lt Pressure (mmHg) Index Waveform Comment  +--------+------------------+-----+--------+-------+  Brachial 168                                        +--------+------------------+-----+--------+-------+ +-------+-----------+-----------+------------+------------+  ABI/TBI Today's ABI Today's TBI Previous ABI Previous TBI  +-------+-----------+-----------+------------+------------+  Right   [place]          0.67        [place]           0.93          +-------+-----------+-----------+------------+------------+  Left    BKA         BKA         [place]           0             +-------+-----------+-----------+------------+------------+   Summary: Right: Resting right ankle-brachial index indicates noncompressible right lower extremity arteries. The right toe-brachial index is abnormal.  *See table(s) above for measurements and observations.  Electronically signed by Monica Martinez MD on 10/10/2021 at 11:13:17 AM.    Final    VAS [place] LOWER EXTREMITY VENOUS (DVT)  Result Date: 10/16/2021  Lower Venous DVT Study Patient Name:  JAYDRIEN WASSENAAR  Date of Exam:   10/16/2021 Medical Rec #: [id number]         Accession #:  [id number] Date of Birth: 08/22/1951         Patient Gender: M Patient Age:   43 years Exam Location:  [facility] Procedure:      VAS [place] LOWER EXTREMITY VENOUS (DVT) Referring Phys: Jerald Kief REGALADO --------------------------------------------------------------------------------  Indications: Follow up exam.   Anticoagulation: Eliquis (before hospitalization). Comparison       Previous exam on 08/12/2021 was positive for DVT in LLE CFV & Study:           [facility] Performing Technologist: Rogelia Rohrer RVT, RDMS  Examination Guidelines: A complete evaluation includes B-mode imaging, spectral Doppler, color Doppler, and power Doppler as needed of all accessible portions of each vessel. Bilateral testing is considered an integral part of a complete examination. Limited examinations for reoccurring indications may be performed as noted. The reflux portion of the exam is performed with the patient in reverse Trendelenburg.  +---------+---------------+---------+-----------+----------+--------------+  RIGHT     Compressibility Phasicity Spontaneity Properties Thrombus Aging  +---------+---------------+---------+-----------+----------+--------------+  CFV       Full            Yes       Yes                                    +---------+---------------+---------+-----------+----------+--------------+  SFJ       Partial                                          Chronic         +---------+---------------+---------+-----------+----------+--------------+  FV Prox   Full            Yes       Yes                                    +---------+---------------+---------+-----------+----------+--------------+  FV Mid    Full            Yes       Yes                                    +---------+---------------+---------+-----------+----------+--------------+  FV Distal Full            Yes       Yes                                    +---------+---------------+---------+-----------+----------+--------------+  PFV       Full                                                             +---------+---------------+---------+-----------+----------+--------------+  POP       Full            Yes       Yes                                    +---------+---------------+---------+-----------+----------+--------------+  PTV                                                         BKA             +---------+---------------+---------+-----------+----------+--------------+  PERO                                                       BKA             +---------+---------------+---------+-----------+----------+--------------+ Small area of residual thrombus noted in the SFJ  +----+---------------+---------+-----------+----------+--------------+  LEFT Compressibility Phasicity Spontaneity Properties Thrombus Aging  +----+---------------+---------+-----------+----------+--------------+  CFV  Full            Yes       Yes                                    +----+---------------+---------+-----------+----------+--------------+     Summary: RIGHT: - No evidence of common femoral vein obstruction. - Ultrasound characteristics of enlarged lymph nodes are noted in the groin.  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 - Findings appear improved from previous examination. - There is no evidence of superficial venous thrombosis.  - No cystic structure found in the popliteal fossa. - Ultrasound characteristics of enlarged lymph nodes noted in the groin.  *See table(s) above for measurements and observations. Electronically signed by Monica Martinez MD on 10/16/2021 at 3:01:01 PM.    Final     Labs: BNP (last 3 results) No results for input(s): BNP in the last 8760 hours. Basic Metabolic Panel: Recent Labs  Lab 10/19/21 0718 10/20/21 1022 10/20/21 1941 10/21/21 0457 10/23/21 0613 10/23/21 0630 10/24/21 0228  NA 135 134* 133* 132* 134*  --  133*  K 3.6 4.4 4.2 4.2 4.3  --  3.8  CL 99 98 97* 96* 95*  --  97*  CO2 26  --  26 25 25   --  23  GLUCOSE 139* 154* 171* 202* 215*  --  145*  BUN 13 37* 11 18 40*  --  23  CREATININE 4.45* 6.70* 3.39* 4.44* 8.29*  --  4.74*  CALCIUM 8.8*  --  8.7* 9.0 9.3  --  8.5*  MG 2.4  --  2.1 2.1 2.2  --  2.0  PHOS  --   --   --   --   --  3.1 2.2*   Liver Function Tests: Recent Labs  Lab  10/23/21 0630 10/24/21 0228  ALBUMIN 2.9* 2.9*   No results for input(s): LIPASE, AMYLASE in the last 168 hours. No results for input(s): AMMONIA in the last 168 hours. CBC: Recent Labs  Lab 10/19/21 0718 10/20/21 1022 10/20/21 1941 10/21/21 0457 10/23/21 0613 10/24/21 0228  WBC 6.5  --  11.2* 13.5* 13.2* 12.7*  NEUTROABS 3.9  --  8.5* 10.8* 9.9* 9.2*  HGB 7.5* 9.9* 8.3* 7.7* 7.0* 8.6*  HCT 23.4* 29.0* 25.8* 23.3* 21.7* 26.1*  MCV 95.1  --  95.9 95.5 96.4 90.9  PLT 213  --  206 211 259 261  Cardiac Enzymes: No results for input(s): CKTOTAL, CKMB, CKMBINDEX, TROPONINI in the last 168 hours. BNP: Invalid input(s): POCBNP CBG: Recent Labs  Lab 10/23/21 1757 10/23/21 2112 10/24/21 0533 10/24/21 0722 10/24/21 1154  GLUCAP 174* 209* 165* 159* 156*   D-Dimer No results for input(s): DDIMER in the last 72 hours. Hgb A1c No results for input(s): HGBA1C in the last 72 hours. Lipid Profile No results for input(s): CHOL, HDL, LDLCALC, TRIG, CHOLHDL, LDLDIRECT in the last 72 hours. Thyroid function studies No results for input(s): TSH, T4TOTAL, T3FREE, THYROIDAB in the last 72 hours.  Invalid input(s): FREET3 Anemia work up No results for input(s): VITAMINB12, FOLATE, FERRITIN, TIBC, IRON, RETICCTPCT in the last 72 hours. Urinalysis    Component Value Date/Time   COLORURINE YELLOW 10/14/2021 1040   APPEARANCEUR HAZY (A) 10/14/2021 1040   LABSPEC 1.009 10/14/2021 1040   PHURINE 9.0 (H) 10/14/2021 1040   GLUCOSEU 50 (A) 10/14/2021 1040   HGBUR MODERATE (A) 10/14/2021 1040   BILIRUBINUR NEGATIVE 10/14/2021 1040   KETONESUR NEGATIVE 10/14/2021 1040   PROTEINUR 100 (A) 10/14/2021 1040   UROBILINOGEN 0.2 10/07/2011 1618   NITRITE NEGATIVE 10/14/2021 1040   LEUKOCYTESUR LARGE (A) 10/14/2021 1040   Sepsis Labs Invalid input(s): PROCALCITONIN,  WBC,  LACTICIDVEN Microbiology Recent Results (from the past 240 hour(s))  Resp Panel by RT-PCR (Flu A&B, Covid)  Nasopharyngeal Swab     Status: None   Collection Time: 10/24/21 10:00 AM   Specimen: Nasopharyngeal Swab; Nasopharyngeal(NP) swabs in vial transport medium  Result Value Ref Range Status   SARS Coronavirus 2 by RT PCR NEGATIVE NEGATIVE Final    Comment: (NOTE) SARS-CoV-2 target nucleic acids are NOT DETECTED.  The SARS-CoV-2 RNA is generally detectable in upper respiratory specimens during the acute phase of infection. The lowest concentration of SARS-CoV-2 viral copies this assay can detect is 138 copies/mL. A negative result does not preclude SARS-Cov-2 infection and should not be used as the sole basis for treatment or other patient management decisions. A negative result may occur with  improper specimen collection/handling, submission of specimen other than nasopharyngeal swab, presence of viral mutation(s) within the areas targeted by this assay, and inadequate number of viral copies(<138 copies/mL). A negative result must be combined with clinical observations, patient history, and epidemiological information. The expected result is Negative.  Fact Sheet for Patients:  EntrepreneurPulse.com.au  Fact Sheet for Healthcare Providers:  IncredibleEmployment.be  This test is no t yet approved or cleared by the [place] FDA and  has been authorized for detection and/or diagnosis of SARS-CoV-2 by FDA under an Emergency Use Authorization (EUA). This EUA will remain  in effect (meaning this test can be used) for the duration of the COVID-19 declaration under Section 564(b)(1) of the Act, 21 U.S.C.section 360bbb-3(b)(1), unless the authorization is terminated  or revoked sooner.       Influenza A by PCR NEGATIVE NEGATIVE Final   Influenza B by PCR NEGATIVE NEGATIVE Final    Comment: (NOTE) The Xpert Xpress SARS-CoV-2/FLU/RSV plus assay is intended as an aid in the diagnosis of influenza from Nasopharyngeal swab specimens and should not be  used as a sole basis for treatment. Nasal washings and aspirates are unacceptable for Xpert Xpress SARS-CoV-2/FLU/RSV testing.  Fact Sheet for Patients: EntrepreneurPulse.com.au  Fact Sheet for Healthcare Providers: IncredibleEmployment.be  This test is not yet approved or cleared by the [place] FDA and has been authorized for detection and/or diagnosis of SARS-CoV-2 by FDA under an Emergency Use Authorization (EUA).  This EUA will remain in effect (meaning this test can be used) for the duration of the COVID-19 declaration under Section 564(b)(1) of the Act, 21 U.S.C. section 360bbb-3(b)(1), unless the authorization is terminated or revoked.  Performed at [facility], [place] [address], [place], [place] [address]      Time coordinating discharge: 35 minutes  SIGNED: Antonieta Pert, MD  [facility] 10/24/2021, 4:09 PM  If 7PM-7AM, please contact night-coverage [email or web address]

## 2021-10-27 ENCOUNTER — Encounter (HOSPITAL_COMMUNITY): Payer: Self-pay

## 2021-10-27 ENCOUNTER — Ambulatory Visit (HOSPITAL_COMMUNITY): Admit: 2021-10-27 | Payer: No Typology Code available for payment source | Admitting: Vascular Surgery

## 2021-10-27 LAB — TYPE AND SCREEN
ABO/RH(D): O POS
Antibody Screen: POSITIVE
DAT, IgG: POSITIVE
Donor AG Type: NEGATIVE
Donor AG Type: NEGATIVE
Unit division: 0
Unit division: 0

## 2021-10-27 LAB — BPAM RBC
Blood Product Expiration Date: 202301182359
Blood Product Expiration Date: 202301262359
ISSUE DATE / TIME: 202301091436
Unit Type and Rh: 5100
Unit Type and Rh: 5100

## 2021-10-27 SURGERY — ABDOMINAL AORTOGRAM W/LOWER EXTREMITY
Anesthesia: LOCAL

## 2021-11-14 ENCOUNTER — Ambulatory Visit: Payer: No Typology Code available for payment source | Admitting: Vascular Surgery

## 2021-11-14 ENCOUNTER — Other Ambulatory Visit (HOSPITAL_COMMUNITY): Payer: No Typology Code available for payment source

## 2021-11-14 ENCOUNTER — Encounter (HOSPITAL_COMMUNITY): Payer: No Typology Code available for payment source

## 2021-11-20 ENCOUNTER — Other Ambulatory Visit: Payer: Self-pay

## 2021-11-20 DIAGNOSIS — T827XXA Infection and inflammatory reaction due to other cardiac and vascular devices, implants and grafts, initial encounter: Secondary | ICD-10-CM

## 2021-11-20 NOTE — Progress Notes (Signed)
VASCULAR AND VEIN SPECIALISTS OF   ASSESSMENT / PLAN: 71 y.o. male with end-stage renal disease.  He is dialyzing Monday, Wednesday, and Friday through a left forearm loop arteriovenous graft created in 2020.  Patient is referred for new access, but his right forearm loop appears to have a good thrill.  He is currently been dialyzing through a left IJ tunneled dialysis catheter placed at the time of the attempted thrombectomy.  I suspect the thrombectomy was more successful than anticipated.  I encouraged him to try using the forearm loop graft.  If this does not work Architectural technologist, I will create new access for him in the near future on a nondialysis day.  CHIEF COMPLAINT: Dialysis access  HISTORY OF PRESENT ILLNESS: Roger Vazquez is a 71 y.o. male very well-known to me status post bilateral below-knee amputations.  He has a history of end-stage renal disease dialyzing Monday, Wednesday and Friday at.  He dialyzes usually through a right upper extremity forearm loop arteriovenous graft.  He recently underwent a thrombectomy at [facility] vascular.  This was done technically successful.  Internal dialysis catheter was placed.  He has been dialyzing through this.  He is living in a skilled nursing facility.  He is not a great historian.  VASCULAR SURGICAL HISTORY:  Left below-knee amputation (06/08/2021) after endovascular revascularization (04/28/2021) Right below-knee amputation (10/20/2021).  FUNCTIONAL STATUS: ECOG performance status: (4) Completely disabled, unable to carry out self-care and confined to bed or chair Ambulatory status: Nonambulatory  Past Medical History:  Diagnosis Date   Anemia    Blood dyscrasia    history of coagulation deficit per [facility] nursing home   Cancer [facility])    Lung   Diabetes mellitus    Hepatitis    Hypertension    Renal disorder    MWF- [facility]    Past Surgical History:  Procedure Laterality Date   ABDOMINAL AORTOGRAM W/LOWER  EXTREMITY Left 04/28/2021   Procedure: ABDOMINAL AORTOGRAM W/LOWER EXTREMITY;  Surgeon: Cherre Robins, MD;  Location: [place] [facility];  Service: Cardiovascular;  Laterality: Left;   AMPUTATION Left 05/04/2021   Procedure: LEFT SECOND TOE AMPUTATION;  Surgeon: Cherre Robins, MD;  Location: [place];  Service: Vascular;  Laterality: Left;   AMPUTATION Left 06/08/2021   Procedure: LEFT BELOW KNEE AMPUTATION;  Surgeon: Cherre Robins, MD;  Location: [place];  Service: Vascular;  Laterality: Left;   AMPUTATION Right 10/20/2021   Procedure: RIGHT BELOW KNEE AMPUTATION;  Surgeon: Cherre Robins, MD;  Location: [place];  Service: Vascular;  Laterality: Right;   APPLICATION OF WOUND VAC Left 06/08/2021   Procedure: APPLICATION OF WOUND VAC;  Surgeon: Cherre Robins, MD;  Location: [place];  Service: Vascular;  Laterality: Left;   APPLICATION OF WOUND VAC Right 10/20/2021   Procedure: APPLICATION OF WOUND VAC;  Surgeon: Cherre Robins, MD;  Location: [place];  Service: Vascular;  Laterality: Right;   artiovenous graft placement Left    AV FISTULA PLACEMENT Right 11/27/2018   Procedure: INSERTION 4-7MM X 45CM GORETEX GRAFT RIGHT FOREARM ;  Surgeon: Rosetta Posner, MD;  Location: [place];  Service: Vascular;  Laterality: Right;   [place] REMOVAL Left 08/06/2018   Procedure: REMOVAL OF LEFT ARM GRAFT;  Surgeon: Rosetta Posner, MD;  Location: [place];  Service: Vascular;  Laterality: Left;   BIOPSY  10/17/2021   Procedure: BIOPSY;  Surgeon: Irene Shipper, MD;  Location: [place];  Service: Endoscopy;;  COLONOSCOPY WITH PROPOFOL N/A 10/17/2021   Procedure: COLONOSCOPY WITH PROPOFOL;  Surgeon: Irene Shipper, MD;  Location: [facility] ENDOSCOPY;  Service: Endoscopy;  Laterality: N/A;   ESOPHAGOGASTRODUODENOSCOPY (EGD) WITH PROPOFOL N/A 10/17/2021   Procedure: ESOPHAGOGASTRODUODENOSCOPY (EGD) WITH PROPOFOL;  Surgeon: Irene Shipper, MD;  Location: [facility]COPY;  Service: Endoscopy;  Laterality: N/A;   INSERTION OF DIALYSIS  CATHETER Right 08/08/2018   Procedure: INSERTION OF TUNNELED  DIALYSIS CATHETER;  Surgeon: Marty Heck, MD;  Location: [place];  Service: Vascular;  Laterality: Right;   IR DIALY SHUNT INTRO NEEDLE/INTRACATH INITIAL W/IMG RIGHT Right 05/26/2021   IR REMOVAL TUN CV CATH W/O FL  02/19/2019   IR US GUIDE VASC ACCESS RIGHT  05/26/2021   LUNG SURGERY     patient not if they removed part of lung. Was done at [place] Left 04/28/2021   Procedure: PERIPHERAL VASCULAR BALLOON ANGIOPLASTY;  Surgeon: Cherre Robins, MD;  Location: [place] [facility];  Service: Cardiovascular;  Laterality: Left;  popliteal and posterial tibial   TEE WITHOUT CARDIOVERSION N/A 07/31/2018   Procedure: TRANSESOPHAGEAL ECHOCARDIOGRAM (TEE);  Surgeon: Pixie Casino, MD;  Location: [facility] ENDOSCOPY;  Service: Cardiovascular;  Laterality: N/A;   TRANSMETATARSAL AMPUTATION Left 05/22/2021   Procedure: LEFT TRANSMETATARSAL AMPUTATION;  Surgeon: Elam Dutch, MD;  Location: [place];  Service: Vascular;  Laterality: Left;    History reviewed. No pertinent family history.  Social History   Socioeconomic History   Marital status: Divorced    Spouse name: Not on file   Number of children: Not on file   Years of education: Not on file   Highest education level: Not on file  Occupational History   Occupation: retired  Tobacco Use   Smoking status: Former    Packs/day: 0.50    Years: 10.00    Pack years: 5.00    Types: Cigarettes    Quit date: 2001    Years since quitting: 22.1   Smokeless tobacco: Former    Quit date: 08/01/2015  Vaping Use   Vaping Use: Never used  Substance and Sexual Activity   Alcohol use: Not Currently    Alcohol/week: 2.0 standard drinks    Types: 2 Cans of beer per week   Drug use: Not Currently    Types: Marijuana    Comment: last time 11/19/2018   Sexual activity: Not on file    Comment: Pt uses vicodin  Other Topics Concern   Not on file   Social History Narrative   Not on file   Social Determinants of Health   Financial Resource Strain: Not on file  Food Insecurity: Not on file  Transportation Needs: Not on file  Physical Activity: Not on file  Stress: Not on file  Social Connections: Not on file  Intimate Partner Violence: Not on file    Allergies  Allergen Reactions   Sildenafil Other (See Comments)    Pt stated, "Makes my Blood Pressure drop"    Current Outpatient Medications  Medication Sig Dispense Refill   acetaminophen (TYLENOL) 325 MG tablet Take 650 mg by mouth every 6 (six) hours.     amLODipine (NORVASC) 10 MG tablet Take 1 tablet (10 mg total) by mouth daily.     apixaban (ELIQUIS) 5 MG TABS tablet Take 1 tablet (5 mg total) by mouth 2 (two) times daily. 60 tablet    aspirin 81 MG chewable tablet Chew 81 mg by mouth daily. (0800)  calcium acetate (PHOSLO) 667 MG capsule Take 667 mg by mouth 3 (three) times daily with meals. (0800, 1300 & 1700)     ferrous sulfate 325 (65 FE) MG tablet Take 325 mg by mouth daily with breakfast.     hydrocortisone (ANUSOL-HC) 25 MG suppository Place 25 mg rectally 2 (two) times daily as needed for hemorrhoids or anal itching.     lidocaine-prilocaine (EMLA) cream Apply 1 application topically See admin instructions. As needed on dialysis days.(Monday,Wednesday and Friday)     Multiple Vitamin (MULITIVITAMIN WITH MINERALS) TABS Take 1 tablet by mouth daily. 30 tablet 0   Nutritional Supplements (NOVASOURCE RENAL PO) Take 237 mLs by mouth daily.     oxyCODONE (OXY IR/ROXICODONE) 5 MG immediate release tablet Take 1 tablet (5 mg total) by mouth 3 (three) times daily as needed for up to 4 doses for severe pain. 4 tablet 0   pantoprazole (PROTONIX) 40 MG tablet Take 1 tablet (40 mg total) by mouth 2 (two) times daily.     polyethylene glycol (MIRALAX / GLYCOLAX) 17 g packet Take 17 g by mouth 2 (two) times daily. 14 each 0   senna (SENOKOT) 8.6 MG TABS tablet Take 1 tablet  (8.6 mg total) by mouth daily. 7 tablet 0   No current facility-administered medications for this visit.    REVIEW OF SYSTEMS:  [X]  denotes positive finding, [ ]  denotes negative finding Cardiac  Comments:  Chest pain or chest pressure:    Shortness of breath upon exertion:    Short of breath when lying flat:    Irregular heart rhythm:        Vascular    Pain in calf, thigh, or hip brought on by ambulation:    Pain in feet at night that wakes you up from your sleep:     Blood clot in your veins:    Leg swelling:         Pulmonary    Oxygen at home:    Productive cough:     Wheezing:         Neurologic    Sudden weakness in arms or legs:     Sudden numbness in arms or legs:     Sudden onset of difficulty speaking or slurred speech:    Temporary loss of vision in one eye:     Problems with dizziness:         Gastrointestinal    Blood in stool:     Vomited blood:         Genitourinary    Burning when urinating:     Blood in urine:        Psychiatric    Major depression:         Hematologic    Bleeding problems:    Problems with blood clotting too easily:        Skin    Rashes or ulcers:        Constitutional    Fever or chills:      PHYSICAL EXAM Vitals:   11/21/21 1451  BP: (!) 145/70  Pulse: 73  Resp: 20  Temp: 98.5 F (36.9 C)  SpO2: 100%  Weight: 112 lb (50.8 kg)  Height: 5\' 6"  (1.676 m)    Constitutional: Chronically ill appearing.  No distress. Appears well nourished.  Neurologic: CN intact.  No focal findings.  No sensory loss. Psychiatric:  Mood and affect symmetric and appropriate. Eyes:  No icterus. No conjunctival pallor. Ears, nose, throat:  mucous membranes moist. Midline trachea.  Cardiac: Regular rate and rhythm.  Respiratory:  unlabored. Abdominal:  soft, non-tender, non-distended.  Peripheral vascular: Bilateral below-knee amputations.  Right below-knee amputation healing appropriately.  Staples removed in the office.  Right  upper extremity forearm loop AV graft with good thrill. Extremity: No edema.  No cyanosis.  No pallor.  Skin: No gangrene.  No ulceration.  Lymphatic: No Stemmer's sign.  No palpable lymphadenopathy.  PERTINENT LABORATORY AND RADIOLOGIC DATA  Most recent CBC CBC Latest Ref Rng & Units 10/24/2021 10/23/2021 10/21/2021  WBC 4.0 - 10.5 K/uL 12.7(H) 13.2(H) 13.5(H)  Hemoglobin 13.0 - 17.0 g/dL 8.6(L) 7.0(L) 7.7(L)  Hematocrit 39.0 - 52.0 % 26.1(L) 21.7(L) 23.3(L)  Platelets 150 - 400 K/uL 261 259 211     Most recent CMP CMP Latest Ref Rng & Units 10/24/2021 10/23/2021 10/21/2021  Glucose 70 - 99 mg/dL 145(H) 215(H) 202(H)  BUN 8 - 23 mg/dL 23 40(H) 18  Creatinine 0.61 - 1.24 mg/dL 4.74(H) 8.29(H) 4.44(H)  Sodium 135 - 145 mmol/L 133(L) 134(L) 132(L)  Potassium 3.5 - 5.1 mmol/L 3.8 4.3 4.2  Chloride 98 - 111 mmol/L 97(L) 95(L) 96(L)  CO2 22 - 32 mmol/L 23 25 25   Calcium 8.9 - 10.3 mg/dL 8.5(L) 9.3 9.0  Total Protein 6.5 - 8.1 g/dL - - -  Total Bilirubin 0.3 - 1.2 mg/dL - - -  Alkaline Phos 38 - 126 U/L - - -  AST 15 - 41 U/L - - -  ALT 0 - 44 U/L - - -    Renal function CrCl cannot be calculated (Patient's most recent lab result is older than the maximum 21 days allowed.).  Hgb A1c MFr Bld (%)  Date Value  10/18/2021 6.6 (H)    LDL Cholesterol  Date Value Ref Range Status  04/29/2021 28 0 - 99 mg/dL Final    Comment:           Total Cholesterol/HDL:CHD Risk Coronary Heart Disease Risk Table                     Men   Women  1/2 Average Risk   3.4   3.3  Average Risk       5.0   4.4  2 X Average Risk   9.6   7.1  3 X Average Risk  23.4   11.0        Use the calculated Patient Ratio above and the CHD Risk Table to determine the patient's CHD Risk.        ATP III CLASSIFICATION (LDL):  <100     mg/dL   Optimal  100-129  mg/dL   Near or Above                    Optimal  130-159  mg/dL   Borderline  160-189  mg/dL   High  >190     mg/dL   Very High Performed at [facility] [address], [place], [place] [address]     Preoperative vein mapping personally reviewed.  Shows no usable upper extremity vein for autogenous access.  [name]. Stanford Breed, MD Vascular [facility] Vein Specialists of [facility] Phone Number: [phone] 11/21/2021 5:30 PM  Total time spent on preparing this encounter including chart review, data review, collecting history, examining the patient, coordinating care for this established patient, 40 minutes.  Portions of this report may have been transcribed using voice recognition software.  Every effort has been made to ensure accuracy; however, inadvertent computerized transcription errors may still be present.

## 2021-11-21 ENCOUNTER — Encounter: Payer: Self-pay | Admitting: Vascular Surgery

## 2021-11-21 ENCOUNTER — Other Ambulatory Visit: Payer: Self-pay

## 2021-11-21 ENCOUNTER — Ambulatory Visit (INDEPENDENT_AMBULATORY_CARE_PROVIDER_SITE_OTHER)
Admit: 2021-11-21 | Discharge: 2021-11-21 | Disposition: A | Discharge status: Home / Self Care | Payer: Medicare HMO | Attending: Vascular Surgery | Admitting: Vascular Surgery

## 2021-11-21 ENCOUNTER — Ambulatory Visit (HOSPITAL_COMMUNITY)
Admission: RE | Admit: 2021-11-21 | Discharge: 2021-11-21 | Disposition: A | Discharge status: Home / Self Care | Payer: Medicare HMO | Source: Ambulatory Visit | Attending: Vascular Surgery | Admitting: Vascular Surgery

## 2021-11-21 ENCOUNTER — Ambulatory Visit (INDEPENDENT_AMBULATORY_CARE_PROVIDER_SITE_OTHER): Payer: Medicare HMO | Admitting: Vascular Surgery

## 2021-11-21 VITALS — BP 145/70 | HR 73 | Temp 98.5°F | Resp 20 | Ht 66.0 in | Wt 112.0 lb

## 2021-11-21 DIAGNOSIS — N186 End stage renal disease: Secondary | ICD-10-CM

## 2021-11-21 DIAGNOSIS — T827XXA Infection and inflammatory reaction due to other cardiac and vascular devices, implants and grafts, initial encounter: Secondary | ICD-10-CM

## 2021-11-21 DIAGNOSIS — Z992 Dependence on renal dialysis: Secondary | ICD-10-CM

## 2021-12-01 ENCOUNTER — Inpatient Hospital Stay (HOSPITAL_COMMUNITY)
Admission: EM | Admit: 2021-12-01 | Discharge: 2021-12-11 | DRG: 981 | Disposition: A | Discharge status: Hospice | Payer: No Typology Code available for payment source | Attending: Internal Medicine | Admitting: Internal Medicine

## 2021-12-01 ENCOUNTER — Encounter (HOSPITAL_COMMUNITY): Payer: Self-pay | Admitting: Internal Medicine

## 2021-12-01 ENCOUNTER — Emergency Department (HOSPITAL_COMMUNITY): Payer: No Typology Code available for payment source

## 2021-12-01 ENCOUNTER — Telehealth: Payer: Self-pay | Admitting: *Deleted

## 2021-12-01 ENCOUNTER — Telehealth (HOSPITAL_COMMUNITY): Payer: Self-pay | Admitting: Nephrology

## 2021-12-01 DIAGNOSIS — N2581 Secondary hyperparathyroidism of renal origin: Secondary | ICD-10-CM | POA: Diagnosis present

## 2021-12-01 DIAGNOSIS — L0390 Cellulitis, unspecified: Secondary | ICD-10-CM | POA: Diagnosis present

## 2021-12-01 DIAGNOSIS — Z7982 Long term (current) use of aspirin: Secondary | ICD-10-CM

## 2021-12-01 DIAGNOSIS — Z20822 Contact with and (suspected) exposure to covid-19: Secondary | ICD-10-CM | POA: Diagnosis present

## 2021-12-01 DIAGNOSIS — T8789 Other complications of amputation stump: Secondary | ICD-10-CM | POA: Diagnosis present

## 2021-12-01 DIAGNOSIS — I82409 Acute embolism and thrombosis of unspecified deep veins of unspecified lower extremity: Secondary | ICD-10-CM | POA: Diagnosis present

## 2021-12-01 DIAGNOSIS — D631 Anemia in chronic kidney disease: Secondary | ICD-10-CM | POA: Diagnosis present

## 2021-12-01 DIAGNOSIS — L8990 Pressure ulcer of unspecified site, unspecified stage: Secondary | ICD-10-CM | POA: Insufficient documentation

## 2021-12-01 DIAGNOSIS — Z87891 Personal history of nicotine dependence: Secondary | ICD-10-CM | POA: Diagnosis not present

## 2021-12-01 DIAGNOSIS — N390 Urinary tract infection, site not specified: Secondary | ICD-10-CM | POA: Clinically undetermined

## 2021-12-01 DIAGNOSIS — E43 Unspecified severe protein-calorie malnutrition: Secondary | ICD-10-CM | POA: Diagnosis present

## 2021-12-01 DIAGNOSIS — T8131XA Disruption of external operation (surgical) wound, not elsewhere classified, initial encounter: Secondary | ICD-10-CM | POA: Diagnosis present

## 2021-12-01 DIAGNOSIS — N189 Chronic kidney disease, unspecified: Secondary | ICD-10-CM | POA: Diagnosis present

## 2021-12-01 DIAGNOSIS — Z681 Body mass index (BMI) 19 or less, adult: Secondary | ICD-10-CM

## 2021-12-01 DIAGNOSIS — Z79899 Other long term (current) drug therapy: Secondary | ICD-10-CM

## 2021-12-01 DIAGNOSIS — L03115 Cellulitis of right lower limb: Secondary | ICD-10-CM | POA: Diagnosis present

## 2021-12-01 DIAGNOSIS — Z89512 Acquired absence of left leg below knee: Secondary | ICD-10-CM

## 2021-12-01 DIAGNOSIS — N186 End stage renal disease: Secondary | ICD-10-CM | POA: Diagnosis present

## 2021-12-01 DIAGNOSIS — T8781 Dehiscence of amputation stump: Secondary | ICD-10-CM | POA: Diagnosis present

## 2021-12-01 DIAGNOSIS — Z888 Allergy status to other drugs, medicaments and biological substances status: Secondary | ICD-10-CM

## 2021-12-01 DIAGNOSIS — Z66 Do not resuscitate: Secondary | ICD-10-CM | POA: Diagnosis present

## 2021-12-01 DIAGNOSIS — I10 Essential (primary) hypertension: Secondary | ICD-10-CM | POA: Diagnosis not present

## 2021-12-01 DIAGNOSIS — Y835 Amputation of limb(s) as the cause of abnormal reaction of the patient, or of later complication, without mention of misadventure at the time of the procedure: Secondary | ICD-10-CM | POA: Diagnosis present

## 2021-12-01 DIAGNOSIS — E1152 Type 2 diabetes mellitus with diabetic peripheral angiopathy with gangrene: Principal | ICD-10-CM | POA: Diagnosis present

## 2021-12-01 DIAGNOSIS — T8753 Necrosis of amputation stump, right lower extremity: Secondary | ICD-10-CM | POA: Diagnosis not present

## 2021-12-01 DIAGNOSIS — T8743 Infection of amputation stump, right lower extremity: Secondary | ICD-10-CM | POA: Diagnosis present

## 2021-12-01 DIAGNOSIS — I120 Hypertensive chronic kidney disease with stage 5 chronic kidney disease or end stage renal disease: Secondary | ICD-10-CM | POA: Diagnosis present

## 2021-12-01 DIAGNOSIS — Z8616 Personal history of COVID-19: Secondary | ICD-10-CM | POA: Diagnosis not present

## 2021-12-01 DIAGNOSIS — Z7901 Long term (current) use of anticoagulants: Secondary | ICD-10-CM

## 2021-12-01 DIAGNOSIS — G9341 Metabolic encephalopathy: Secondary | ICD-10-CM | POA: Diagnosis present

## 2021-12-01 DIAGNOSIS — Z515 Encounter for palliative care: Secondary | ICD-10-CM

## 2021-12-01 DIAGNOSIS — Z7189 Other specified counseling: Secondary | ICD-10-CM | POA: Diagnosis not present

## 2021-12-01 DIAGNOSIS — E1122 Type 2 diabetes mellitus with diabetic chronic kidney disease: Secondary | ICD-10-CM | POA: Diagnosis present

## 2021-12-01 DIAGNOSIS — Z992 Dependence on renal dialysis: Secondary | ICD-10-CM

## 2021-12-01 DIAGNOSIS — Z86718 Personal history of other venous thrombosis and embolism: Secondary | ICD-10-CM

## 2021-12-01 DIAGNOSIS — E1169 Type 2 diabetes mellitus with other specified complication: Secondary | ICD-10-CM | POA: Diagnosis present

## 2021-12-01 LAB — PROTIME-INR
INR: 1.3 — ABNORMAL HIGH (ref 0.8–1.2)
Prothrombin Time: 16.3 seconds — ABNORMAL HIGH (ref 11.4–15.2)

## 2021-12-01 LAB — APTT: aPTT: 40 seconds — ABNORMAL HIGH (ref 24–36)

## 2021-12-01 LAB — CBC WITH DIFFERENTIAL/PLATELET
Abs Immature Granulocytes: 0.04 10*3/uL (ref 0.00–0.07)
Basophils Absolute: 0 10*3/uL (ref 0.0–0.1)
Basophils Relative: 0 %
Eosinophils Absolute: 0.1 10*3/uL (ref 0.0–0.5)
Eosinophils Relative: 1 %
HCT: 34.4 % — ABNORMAL LOW (ref 39.0–52.0)
Hemoglobin: 11.2 g/dL — ABNORMAL LOW (ref 13.0–17.0)
Immature Granulocytes: 0 %
Lymphocytes Relative: 9 %
Lymphs Abs: 1 10*3/uL (ref 0.7–4.0)
MCH: 30.6 pg (ref 26.0–34.0)
MCHC: 32.6 g/dL (ref 30.0–36.0)
MCV: 94 fL (ref 80.0–100.0)
Monocytes Absolute: 0.9 10*3/uL (ref 0.1–1.0)
Monocytes Relative: 8 %
Neutro Abs: 9 10*3/uL — ABNORMAL HIGH (ref 1.7–7.7)
Neutrophils Relative %: 82 %
Platelets: 177 10*3/uL (ref 150–400)
RBC: 3.66 MIL/uL — ABNORMAL LOW (ref 4.22–5.81)
RDW: 19.1 % — ABNORMAL HIGH (ref 11.5–15.5)
WBC: 11 10*3/uL — ABNORMAL HIGH (ref 4.0–10.5)
nRBC: 0.2 % (ref 0.0–0.2)

## 2021-12-01 LAB — COMPREHENSIVE METABOLIC PANEL
ALT: 12 U/L (ref 0–44)
AST: 22 U/L (ref 15–41)
Albumin: 3.2 g/dL — ABNORMAL LOW (ref 3.5–5.0)
Alkaline Phosphatase: 89 U/L (ref 38–126)
Anion gap: 16 — ABNORMAL HIGH (ref 5–15)
BUN: 26 mg/dL — ABNORMAL HIGH (ref 8–23)
CO2: 26 mmol/L (ref 22–32)
Calcium: 8.3 mg/dL — ABNORMAL LOW (ref 8.9–10.3)
Chloride: 94 mmol/L — ABNORMAL LOW (ref 98–111)
Creatinine, Ser: 5.83 mg/dL — ABNORMAL HIGH (ref 0.61–1.24)
GFR, Estimated: 10 mL/min — ABNORMAL LOW (ref >60)
Glucose, Bld: 147 mg/dL — ABNORMAL HIGH (ref 70–99)
Potassium: 3 mmol/L — ABNORMAL LOW (ref 3.5–5.1)
Sodium: 136 mmol/L (ref 135–145)
Total Bilirubin: 0.7 mg/dL (ref 0.3–1.2)
Total Protein: 8.6 g/dL — ABNORMAL HIGH (ref 6.5–8.1)

## 2021-12-01 LAB — AMMONIA: Ammonia: 17 umol/L (ref 9–35)

## 2021-12-01 LAB — CBG MONITORING, ED: Glucose-Capillary: 124 mg/dL — ABNORMAL HIGH (ref 70–99)

## 2021-12-01 LAB — LACTIC ACID, PLASMA: Lactic Acid, Venous: 1.6 mmol/L (ref 0.5–1.9)

## 2021-12-01 MED ORDER — SODIUM CHLORIDE 0.9 % IV SOLN
2.0000 g | Freq: Once | INTRAVENOUS | Status: AC
  Administered 2021-12-01: 2 g via INTRAVENOUS
  Filled 2021-12-01: qty 20

## 2021-12-01 MED ORDER — VANCOMYCIN HCL IN DEXTROSE 1-5 GM/200ML-% IV SOLN
1000.0000 mg | Freq: Once | INTRAVENOUS | Status: DC
  Filled 2021-12-01: qty 200

## 2021-12-01 MED ORDER — SODIUM CHLORIDE 0.9 % IV SOLN
1.0000 g | INTRAVENOUS | Status: AC
  Administered 2021-12-01 – 2021-12-07 (×7): 1 g via INTRAVENOUS
  Filled 2021-12-01 (×8): qty 1

## 2021-12-01 MED ORDER — VANCOMYCIN HCL 500 MG/100ML IV SOLN
500.0000 mg | INTRAVENOUS | Status: AC
  Administered 2021-12-04 – 2021-12-06 (×2): 500 mg via INTRAVENOUS
  Filled 2021-12-01 (×4): qty 100

## 2021-12-01 MED ORDER — SODIUM CHLORIDE 0.9 % IV SOLN: 2.0000 g | Freq: Once | INTRAVENOUS | Status: DC

## 2021-12-01 MED ORDER — VANCOMYCIN HCL 500 MG/100ML IV SOLN: 500.0000 mg | INTRAVENOUS | Status: DC

## 2021-12-01 MED ORDER — ACETAMINOPHEN 325 MG PO TABS
650.0000 mg | ORAL_TABLET | Freq: Four times a day (QID) | ORAL | Status: DC
  Administered 2021-12-02 – 2021-12-09 (×20): 650 mg via ORAL
  Filled 2021-12-01 (×25): qty 2

## 2021-12-01 MED ORDER — CALCIUM ACETATE (PHOS BINDER) 667 MG PO CAPS
667.0000 mg | ORAL_CAPSULE | Freq: Three times a day (TID) | ORAL | Status: DC
  Administered 2021-12-02 – 2021-12-08 (×17): 667 mg via ORAL
  Filled 2021-12-01 (×19): qty 1

## 2021-12-01 MED ORDER — AMLODIPINE BESYLATE 10 MG PO TABS
10.0000 mg | ORAL_TABLET | Freq: Every day | ORAL | Status: DC
  Administered 2021-12-02 – 2021-12-09 (×7): 10 mg via ORAL
  Filled 2021-12-01 (×2): qty 1
  Filled 2021-12-01: qty 2
  Filled 2021-12-01 (×4): qty 1

## 2021-12-01 MED ORDER — PANTOPRAZOLE SODIUM 40 MG PO TBEC
40.0000 mg | DELAYED_RELEASE_TABLET | Freq: Two times a day (BID) | ORAL | Status: DC
  Administered 2021-12-01 – 2021-12-08 (×13): 40 mg via ORAL
  Filled 2021-12-01 (×13): qty 1

## 2021-12-01 NOTE — Consult Note (Signed)
Hospital Consult    Reason for Consult: Dehisced right BKA stump Requesting Physician: ED MRN #:  [id number]  History of Present Illness: This is a 71 y.o. male with history of 10/20/2021 right-sided below-knee amputation, who presents to [facility] after being evaluated in outpatient dialysis with dehiscence of his right below-knee amputation stump.   On exam, he was somewhat somnolent.  Stated dialysis made him very tired today.   Alert and oriented. Per Lanae Boast, he has noticed erythema and induration for several days.  There has been drainage, no purulence. He denied fevers, chills.    Past Medical History:  Diagnosis Date   Anemia    Blood dyscrasia    history of coagulation deficit per [facility] nursing home   Cancer [facility])    Lung   Diabetes mellitus    Hepatitis    Hypertension    Renal disorder    MWF- [facility]    Past Surgical History:  Procedure Laterality Date   ABDOMINAL AORTOGRAM W/LOWER EXTREMITY Left 04/28/2021   Procedure: ABDOMINAL AORTOGRAM W/LOWER EXTREMITY;  Surgeon: Cherre , MD;  Location: [place] [facility];  Service: Cardiovascular;  Laterality: Left;   AMPUTATION Left 05/04/2021   Procedure: LEFT SECOND TOE AMPUTATION;  Surgeon: Cherre , MD;  Location: [place];  Service: Vascular;  Laterality: Left;   AMPUTATION Left 06/08/2021   Procedure: LEFT BELOW KNEE AMPUTATION;  Surgeon: Cherre , MD;  Location: [place];  Service: Vascular;  Laterality: Left;   AMPUTATION Right 10/20/2021   Procedure: RIGHT BELOW KNEE AMPUTATION;  Surgeon: Cherre , MD;  Location: [place];  Service: Vascular;  Laterality: Right;   APPLICATION OF WOUND VAC Left 06/08/2021   Procedure: APPLICATION OF WOUND VAC;  Surgeon: Cherre , MD;  Location: [place];  Service: Vascular;  Laterality: Left;   APPLICATION OF WOUND VAC Right 10/20/2021   Procedure: APPLICATION OF WOUND VAC;  Surgeon: Cherre , MD;  Location: [place];  Service: Vascular;   Laterality: Right;   artiovenous graft placement Left    AV FISTULA PLACEMENT Right 11/27/2018   Procedure: INSERTION 4-7MM X 45CM GORETEX GRAFT RIGHT FOREARM ;  Surgeon: Rosetta Posner, MD;  Location: [place];  Service: Vascular;  Laterality: Right;   [place] REMOVAL Left 08/06/2018   Procedure: REMOVAL OF LEFT ARM GRAFT;  Surgeon: Rosetta Posner, MD;  Location: [place];  Service: Vascular;  Laterality: Left;   BIOPSY  10/17/2021   Procedure: BIOPSY;  Surgeon: Irene Shipper, MD;  Location: [place];  Service: Endoscopy;;   COLONOSCOPY WITH PROPOFOL N/A 10/17/2021   Procedure: COLONOSCOPY WITH PROPOFOL;  Surgeon: Irene Shipper, MD;  Location: [place];  Service: Endoscopy;  Laterality: N/A;   ESOPHAGOGASTRODUODENOSCOPY (EGD) WITH PROPOFOL N/A 10/17/2021   Procedure: ESOPHAGOGASTRODUODENOSCOPY (EGD) WITH PROPOFOL;  Surgeon: Irene Shipper, MD;  Location: [facility] ENDOSCOPY;  Service: Endoscopy;  Laterality: N/A;   INSERTION OF DIALYSIS CATHETER Right 08/08/2018   Procedure: INSERTION OF TUNNELED  DIALYSIS CATHETER;  Surgeon: Marty Heck, MD;  Location: [place];  Service: Vascular;  Laterality: Right;   IR DIALY SHUNT INTRO NEEDLE/INTRACATH INITIAL W/IMG RIGHT Right 05/26/2021   IR REMOVAL TUN CV CATH W/O FL  02/19/2019   IR US GUIDE VASC ACCESS RIGHT  05/26/2021   LUNG SURGERY     patient not if they removed part of lung. Was done at [place] Left 04/28/2021   Procedure:  PERIPHERAL VASCULAR BALLOON ANGIOPLASTY;  Surgeon: Cherre , MD;  Location: [place] [facility];  Service: Cardiovascular;  Laterality: Left;  popliteal and posterial tibial   TEE WITHOUT CARDIOVERSION N/A 07/31/2018   Procedure: TRANSESOPHAGEAL ECHOCARDIOGRAM (TEE);  Surgeon: Pixie Casino, MD;  Location: [facility] ENDOSCOPY;  Service: Cardiovascular;  Laterality: N/A;   TRANSMETATARSAL AMPUTATION Left 05/22/2021   Procedure: LEFT TRANSMETATARSAL AMPUTATION;  Surgeon: Elam Dutch, MD;   Location: [place];  Service: Vascular;  Laterality: Left;    Allergies  Allergen Reactions   Sildenafil Other (See Comments)    Pt stated, "Makes my Blood Pressure drop"    Prior to Admission medications   Medication Sig Start Date End Date Taking? Authorizing Provider  acetaminophen (TYLENOL) 325 MG tablet Take 650 mg by mouth every 6 (six) hours.    [provider]  amLODipine (NORVASC) 10 MG tablet Take 1 tablet (10 mg total) by mouth daily. 08/16/21   Ghimire, Henreitta Leber, MD  apixaban (ELIQUIS) 5 MG TABS tablet Take 1 tablet (5 mg total) by mouth 2 (two) times daily. 08/19/21   Ghimire, Henreitta Leber, MD  aspirin 81 MG chewable tablet Chew 81 mg by mouth daily. (0800)    [provider]  calcium acetate (PHOSLO) 667 MG capsule Take 667 mg by mouth 3 (three) times daily with meals. (0800, 1300 & 1700)    [provider]  ferrous sulfate 325 (65 FE) MG tablet Take 325 mg by mouth daily with breakfast.    [provider]  hydrocortisone (ANUSOL-HC) 25 MG suppository Place 25 mg rectally 2 (two) times daily as needed for hemorrhoids or anal itching.    [provider]  lidocaine-prilocaine (EMLA) cream Apply 1 application topically See admin instructions. As needed on dialysis days.Laurine Blazer and Friday) 04/28/21   [provider]  Multiple Vitamin (MULITIVITAMIN WITH MINERALS) TABS Take 1 tablet by mouth daily. 10/12/11   Robbie Lis, MD  Nutritional Supplements (NOVASOURCE RENAL PO) Take 237 mLs by mouth daily.    [provider]  oxyCODONE (OXY IR/ROXICODONE) 5 MG immediate release tablet Take 1 tablet (5 mg total) by mouth 3 (three) times daily as needed for up to 4 doses for severe pain. 10/24/21   Antonieta Pert, MD  pantoprazole (PROTONIX) 40 MG tablet Take 1 tablet (40 mg total) by mouth 2 (two) times daily. 10/24/21   Antonieta Pert, MD  polyethylene glycol (MIRALAX / GLYCOLAX) 17 g packet Take 17 g by mouth 2 (two) times daily.  05/05/21   Ezequiel Essex, MD  senna (SENOKOT) 8.6 MG TABS tablet Take 1 tablet (8.6 mg total) by mouth daily. 05/05/21   Ezequiel Essex, MD    Social History   Socioeconomic History   Marital status: Divorced    Spouse name: Not on file   Number of children: Not on file   Years of education: Not on file   Highest education level: Not on file  Occupational History   Occupation: retired  Tobacco Use   Smoking status: Former    Packs/day: 0.50    Years: 10.00    Pack years: 5.00    Types: Cigarettes    Quit date: 2001    Years since quitting: 22.1   Smokeless tobacco: Former    Quit date: 08/01/2015  Vaping Use   Vaping Use: Never used  Substance and Sexual Activity   Alcohol use: Not Currently    Alcohol/week: 2.0 standard drinks    Types: 2 Cans  of beer per week   Drug use: Not Currently    Types: Marijuana    Comment: last time 11/19/2018   Sexual activity: Not on file    Comment: Pt uses vicodin  Other Topics Concern   Not on file  Social History Narrative   Not on file   Social Determinants of Health   Financial Resource Strain: Not on file  Food Insecurity: Not on file  Transportation Needs: Not on file  Physical Activity: Not on file  Stress: Not on file  Social Connections: Not on file  Intimate Partner Violence: Not on file   No family history on file.  ROS: Otherwise negative unless mentioned in HPI  Physical Examination  Vitals:   12/01/21 1730 12/01/21 1744  BP: 139/68   Pulse:  98  Resp:  18  Temp:    SpO2:  98%   Body mass index is 18.08 kg/m.  General:  WDWN in NAD Gait: Not observed HENT: WNL, normocephalic Pulmonary: normal non-labored breathing Cardiac: regular,  Abdomen: soft, NT/ND, no masses Skin: without rashes Extremities: Right-sided below-knee amputation dehisced, no purulence appreciated, site remains soft, no fluctuance Musculoskeletal: no muscle wasting or atrophy  Neurologic: A&O X 3;  No focal weakness or  paresthesias are detected; speech is fluent/normal Psychiatric:  The pt has Normal affect. Lymph:  Unremarkable  CBC    Component Value Date/Time   WBC 11.0 (H) 12/01/2021 1815   RBC 3.66 (L) 12/01/2021 1815   HGB 11.2 (L) 12/01/2021 1815   HCT 34.4 (L) 12/01/2021 1815   PLT 177 12/01/2021 1815   MCV 94.0 12/01/2021 1815   MCH 30.6 12/01/2021 1815   MCHC 32.6 12/01/2021 1815   RDW 19.1 (H) 12/01/2021 1815   LYMPHSABS 1.0 12/01/2021 1815   MONOABS 0.9 12/01/2021 1815   EOSABS 0.1 12/01/2021 1815   BASOSABS 0.0 12/01/2021 1815    BMET    Component Value Date/Time   NA 133 (L) 10/24/2021 0228   K 3.8 10/24/2021 0228   CL 97 (L) 10/24/2021 0228   CO2 23 10/24/2021 0228   GLUCOSE 145 (H) 10/24/2021 0228   BUN 23 10/24/2021 0228   CREATININE 4.74 (H) 10/24/2021 0228   CALCIUM 8.5 (L) 10/24/2021 0228   CALCIUM 7.1 (L) 08/01/2018 0944   GFRNONAA 12 (L) 10/24/2021 0228   GFRAA 5 (L) 08/11/2018 0858    COAGS: Lab Results  Component Value Date   INR 1.3 (H) 12/01/2021   INR 1.1 10/13/2021   INR 1.1 06/08/2021     Non-Invasive Vascular Imaging:   none   ASSESSMENT/PLAN: This is a 71 y.o. male status post 10/20/2021 right-sided below-knee amputation.  On exam today, his incision has dehisced.  There appears to be focal infection.  No purulence appreciated on physical exam.  Afebrile, normal rate.  No signs of systemic infection or need for emergent amputation.  Patient is on Eliquis, please hold.  He would benefit from CT with contrast of the right stump to assess bony involvement.  This will decide his level of revision.  Likely revision Monday.  Cassandria Santee MD MS [facility] [phone] 12/01/2021  7:09 PM

## 2021-12-01 NOTE — Telephone Encounter (Signed)
Veneta Penton PA called left message regarding patient with fever and  infection to patient BKA site. She is sending patient to the ED after Dialysis and antibiotic treatment. Dr Virl Cagey notified.

## 2021-12-01 NOTE — ED Notes (Signed)
The pt came from [facility]

## 2021-12-01 NOTE — ED Notes (Signed)
The pt is sleeping soundly

## 2021-12-01 NOTE — Telephone Encounter (Signed)
ADDING TO Epic CHART FOR CONTINUITY OF CARE:  Presented to outpatient HD today with T 103F. Had missed last HD and says has not been feeling great but no specific symptoms. BP and HR ok. TDC in place without tenderness. RRR, lungs clear. Denies congestion or cough. Stumps inspected, L stump ok, R stump with soiled bandaged -> removed and noted to have incisional dehiscence with purulent drainage and foul odor, as well as erythema/edema of stump.  - Will dialyze today. - Will collect wound and blood cultures - Will give Vancomycin 1g and Ceftazidime 2g at end of HD Then, will send to ED after his treatment for evaluation of R stump wound infection.  Veneta Penton, PA-C [employer] Pager [phone]

## 2021-12-01 NOTE — ED Provider Notes (Addendum)
[facility] EMERGENCY DEPARTMENT Provider Note   CSN: [id number] Arrival date & time: 12/01/21  1618  LEVEL 5 CAVEAT - ALTERED MENTAL STATUS   History  Chief Complaint  Patient presents with   Altered Mental Status    Roger Vazquez is a 71 y.o. male.  HPI 71 year old male presents from the dialysis center with fever.  Nurse who is currently taking care of the patient provides history as well as the nurse notes from who talk to EMS.  However EMS personnel is no longer at the bedside and the patient is barely talking and so is hard to get a good history.  Apparently he had a full dialysis treatment today but was found to have a temperature of 100.6.  They were concerned with BKA infection.  When I talked to patient here seems that he is very slightly whispering so quite I cannot tell what he saying.  Sometimes he very slightly shakes his head yes or no.  When palpating his right leg he does speak in a normal voice but then goes back to not talking.  Home Medications Prior to Admission medications   Medication Sig Start Date End Date Taking? Authorizing Provider  acetaminophen (TYLENOL) 325 MG tablet Take 650 mg by mouth every 6 (six) hours.    [provider]  amLODipine (NORVASC) 10 MG tablet Take 1 tablet (10 mg total) by mouth daily. 08/16/21   Ghimire, Henreitta Leber, MD  apixaban (ELIQUIS) 5 MG TABS tablet Take 1 tablet (5 mg total) by mouth 2 (two) times daily. 08/19/21   Ghimire, Henreitta Leber, MD  aspirin 81 MG chewable tablet Chew 81 mg by mouth daily. (0800)    [provider]  calcium acetate (PHOSLO) 667 MG capsule Take 667 mg by mouth 3 (three) times daily with meals. (0800, 1300 & 1700)    [provider]  ferrous sulfate 325 (65 FE) MG tablet Take 325 mg by mouth daily with breakfast.    [provider]  hydrocortisone (ANUSOL-HC) 25 MG suppository Place 25 mg rectally 2 (two) times daily as needed for hemorrhoids or anal  itching.    [provider]  lidocaine-prilocaine (EMLA) cream Apply 1 application topically See admin instructions. As needed on dialysis days.Laurine Blazer and Friday) 04/28/21   [provider]  Multiple Vitamin (MULITIVITAMIN WITH MINERALS) TABS Take 1 tablet by mouth daily. 10/12/11   Robbie Lis, MD  Nutritional Supplements (NOVASOURCE RENAL PO) Take 237 mLs by mouth daily.    [provider]  oxyCODONE (OXY IR/ROXICODONE) 5 MG immediate release tablet Take 1 tablet (5 mg total) by mouth 3 (three) times daily as needed for up to 4 doses for severe pain. 10/24/21   Antonieta Pert, MD  pantoprazole (PROTONIX) 40 MG tablet Take 1 tablet (40 mg total) by mouth 2 (two) times daily. 10/24/21   Antonieta Pert, MD  polyethylene glycol (MIRALAX / GLYCOLAX) 17 g packet Take 17 g by mouth 2 (two) times daily. 05/05/21   Ezequiel Essex, MD  senna (SENOKOT) 8.6 MG TABS tablet Take 1 tablet (8.6 mg total) by mouth daily. 05/05/21   Ezequiel Essex, MD      Allergies    Sildenafil    Review of Systems   Review of Systems  Unable to perform ROS: Mental status change   Physical Exam Updated Vital Signs BP (!) 120/56    Pulse 91    Temp 98.7 F (37.1 C)    Resp 16  Ht 5\' 6"  (1.676 m)    Wt 50.8 kg    SpO2 99%    BMI 18.08 kg/m  Physical Exam Vitals and nursing note reviewed.  Constitutional:      Appearance: He is well-developed.  HENT:     Head: Normocephalic and atraumatic.  Cardiovascular:     Rate and Rhythm: Normal rate and regular rhythm.     Heart sounds: Normal heart sounds.  Pulmonary:     Effort: Pulmonary effort is normal.     Breath sounds: Normal breath sounds.  Abdominal:     General: There is no distension.     Palpations: Abdomen is soft.     Tenderness: There is no abdominal tenderness.  Musculoskeletal:     Comments: See picture.  Right BKA has swelling, foul odor, and the dressing was a little bit saturated with what appears to be purulent  drainage.  It is diffusely tender.  Skin:    General: Skin is warm and dry.  Neurological:     Mental Status: He is alert.     Comments: Patient is awake and alert. He seems to be able to hear me and seems to be trying to respond to me though he is whispering so quite as hard to understand.  He does yell out when I cause any type of pain or discomfort and he will say "what" but otherwise does not talk to me.     ED Results / Procedures / Treatments   Labs (all labs ordered are listed, but only abnormal results are displayed) Labs Reviewed  COMPREHENSIVE METABOLIC PANEL - Abnormal; Notable for the following components:      Result Value   Potassium 3.0 (*)    Chloride 94 (*)    Glucose, Bld 147 (*)    BUN 26 (*)    Creatinine, Ser 5.83 (*)    Calcium 8.3 (*)    Total Protein 8.6 (*)    Albumin 3.2 (*)    GFR, Estimated 10 (*)    Anion gap 16 (*)    All other components within normal limits  CBC WITH DIFFERENTIAL/PLATELET - Abnormal; Notable for the following components:   WBC 11.0 (*)    RBC 3.66 (*)    Hemoglobin 11.2 (*)    HCT 34.4 (*)    RDW 19.1 (*)    Neutro Abs 9.0 (*)    All other components within normal limits  PROTIME-INR - Abnormal; Notable for the following components:   Prothrombin Time 16.3 (*)    INR 1.3 (*)    All other components within normal limits  APTT - Abnormal; Notable for the following components:   aPTT 40 (*)    All other components within normal limits  CBG MONITORING, ED - Abnormal; Notable for the following components:   Glucose-Capillary 124 (*)    All other components within normal limits  CULTURE, BLOOD (ROUTINE X 2)  CULTURE, BLOOD (ROUTINE X 2)  URINE CULTURE  LACTIC ACID, PLASMA  AMMONIA  LACTIC ACID, PLASMA  URINALYSIS, ROUTINE W REFLEX MICROSCOPIC  COMPREHENSIVE METABOLIC PANEL  CBC    EKG EKG Interpretation  Date/Time:  Friday December 01 2021 17:03:58 EST Ventricular Rate:  97 PR Interval:  264 QRS Duration: 76 QT  Interval:  336 QTC Calculation: 426 R Axis:   70 Text Interpretation: Sinus rhythm with 1st degree A-V block Left ventricular hypertrophy with repolarization abnormality ( Sokolow-Lyon , Romhilt-Estes ) overall similar to Oct 2022 Confirmed by Regenia Skeeter,  Jadarius Commons [phone]) on 12/01/2021 5:19:19 PM  Radiology DG Tibia/Fibula Right  Result Date: 12/01/2021 CLINICAL DATA:  Postop infection. EXAM: RIGHT TIBIA AND FIBULA - 2 VIEW COMPARISON:  None. FINDINGS: Below the knee amputation. Intramedullary rod with proximal locking screws in the proximal tibia. Tibia and fibular resection margins are smooth. No erosions, periosteal reaction, or bone destruction. Mild skin and soft tissue irregularity about the anterior aspect of the distal stump. No tracking soft tissue air. No radiopaque foreign body. The bones are under mineralized. Vascular calcifications are seen. IMPRESSION: 1. Below the knee amputation with intramedullary rod in the proximal tibia. No radiographic findings of osteomyelitis. 2. Mild skin and soft tissue irregularity about the anterior aspect of the distal stump may represent postsurgical change or infection. Electronically Signed   By: Keith Rake M.D.   On: 12/01/2021 17:24   CT Head Wo Contrast  Result Date: 12/01/2021 CLINICAL DATA:  Mental status changes. EXAM: CT HEAD WITHOUT CONTRAST TECHNIQUE: Contiguous axial images were obtained from the base of the skull through the vertex without intravenous contrast. RADIATION DOSE REDUCTION: This exam was performed according to the departmental dose-optimization program which includes automated exposure control, adjustment of the mA and/or kV according to patient size and/or use of iterative reconstruction technique. COMPARISON:  08/11/2021 FINDINGS: Brain: There is no evidence for acute hemorrhage, hydrocephalus, mass lesion, or abnormal extra-axial fluid collection. No definite CT evidence for acute infarction. Diffuse loss of parenchymal volume is  consistent with atrophy. Patchy low attenuation in the deep hemispheric and periventricular white matter is nonspecific, but likely reflects chronic microvascular ischemic demyelination. 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 Vascular: No hyperdense vessel or unexpected calcification. Skull: Normal. Negative for fracture or focal lesion. Sinuses/Orbits: The visualized paranasal sinuses and mastoid air cells are clear. Visualized portions of the globes and intraorbital fat are unremarkable. Other: None IMPRESSION: 1. No acute intracranial abnormality. 2. Atrophy with chronic small vessel ischemic disease. Electronically Signed   By: Misty Stanley M.D.   On: 12/01/2021 19:05   DG Chest Port 1 View  Result Date: 12/01/2021 CLINICAL DATA:  Postoperative infection EXAM: PORTABLE CHEST 1 VIEW COMPARISON:  08/13/2021 FINDINGS: Central line tip in the upper right atrium. Heart size is normal. Chronic aortic atherosclerosis. Previous lobectomy on the left. No sign of pulmonary infiltrate. No visible effusion. Old healed rib fracture on the left. Left axillary region vascular stent. IMPRESSION: No active disease. Electronically Signed   By: Nelson Chimes M.D.   On: 12/01/2021 17:26    Procedures Ultrasound ED Peripheral IV (Provider)  Date/Time: 12/01/2021 8:55 PM Performed by: Sherwood Gambler, MD Authorized by: Sherwood Gambler, MD   Procedure details:    Indications: poor IV access     Skin Prep: chlorhexidine gluconate     Location:  Right AC   Angiocath:  20 G   Bedside Ultrasound Guided: Yes     Patient tolerated procedure without complications: Yes     Dressing applied: Yes      Medications Ordered in ED Medications  vancomycin (VANCOREADY) IVPB 500 mg/100 mL (has no administration in time range)  acetaminophen (TYLENOL) tablet 650 mg (has no administration in time range)  amLODipine (NORVASC) tablet 10 mg (has no administration in time range)  calcium acetate (PHOSLO) capsule 667 mg  (has no administration in time range)  pantoprazole (PROTONIX) EC tablet 40 mg (has no administration in time range)  ceFEPIme (MAXIPIME) 2 g in sodium chloride 0.9 % 100 mL IVPB (has no  administration in time range)  cefTRIAXone (ROCEPHIN) 2 g in sodium chloride 0.9 % 100 mL IVPB (0 g Intravenous Stopped 12/01/21 1925)    ED Course/ Medical Decision Making/ A&P                            Patient seems to be "altered" here so a CT head was obtained.  I personally reviewed these images and there is no emergent head bleed or edema.  However he is starting to talk a little more by the time the vascular surgeons here side do not suspect that he has an acute CNS emergency.  Overall I suspect his fever from dialysis is coming from his BKA on the right.  Appears to have a wound infection. Dr. Virl Cagey of vascular surgery was consulted and has seen patient. Likely will take to OR on 1/20. Chart review shows Dr. Stanford Breed performed the [place] on 10/20/21. Labs reviewed/interpreted, shows mild leukoyctosis, mild hypokalemia, and normal lactate. Chronic kidney disease reflecting dialysis. ECG reviewed/interpreted, shows no acute ischemia or change from baseline. He will need admission for IV antibiotics. Was originally started on vanc/rocephin per protocol. After ceftriaxone started, I was able to see in chart review that at dialysis today he had a temp of 103 and received vanc/ceftaz. Thus vanc will be held tonight. Adjusted by pharmacist. I discussed with Dr. Hal Hope for admission.        Final Clinical Impression(s) / ED Diagnoses Final diagnoses:  Infection of amputation stump of right lower extremity [facility])    Rx / DC Orders ED Discharge Orders     None         Sherwood Gambler, MD 12/01/21 2046    Sherwood Gambler, MD 12/01/21 2056

## 2021-12-01 NOTE — ED Notes (Signed)
The pt  does not answer questions asked he makes eye contact and mumbles sometimes otherwise he just points   im not sure what his normal state is

## 2021-12-01 NOTE — Progress Notes (Addendum)
Pharmacy Antibiotic Note  Roger Vazquez is a 71 y.o. male admitted on 12/01/2021 with cellulitis.  Pharmacy has been consulted for vancomycin dosing. Of note, patient receives HD on MWF. Last session today and patient received a dose of vancomycin 1 gm IV.  WBC mildly elevated.  Plan: -Vancomycin 500 mg IV Q HD session  -Monitor CBC, cultures and clinical progress -Vanc levels as indicated  Height: 5\' 6"  (167.6 cm) Weight: 50.8 kg (112 lb) IBW/kg (Calculated) : 63.8  Temp (24hrs), Avg:99.9 F (37.7 C), Min:99.9 F (37.7 C), Max:99.9 F (37.7 C)  No results for input(s): WBC, CREATININE, LATICACIDVEN, VANCOTROUGH, VANCOPEAK, VANCORANDOM, GENTTROUGH, GENTPEAK, GENTRANDOM, TOBRATROUGH, TOBRAPEAK, TOBRARND, AMIKACINPEAK, AMIKACINTROU, AMIKACIN in the last 168 hours.  CrCl cannot be calculated (Patient's most recent lab result is older than the maximum 21 days allowed.).    Allergies  Allergen Reactions   Sildenafil Other (See Comments)    Pt stated, "Makes my Blood Pressure drop"    Antimicrobials this admission: Ceftriaxone 2/17 >>  Vancomycin 2/17 >>   Dose adjustments this admission:   Microbiology results: 2/17 BCx:  2/17 UCx:    Thank you for allowing pharmacy to be a part of this patients care.  Albertina Parr, PharmD., BCCCP Clinical Pharmacist Please refer to [facility] for unit-specific pharmacist   Addendum: Now adding cefepime for broad spectrum coverage. Start Cefepime 1 gm IV Q 24 hours.   Albertina Parr, PharmD., BCCCP Clinical Pharmacist Please refer to [facility] for unit-specific pharmacist

## 2021-12-01 NOTE — ED Triage Notes (Signed)
Pt here via EMS from dialysis center. Recent remomal of right lower extremity. Possible infection with drainage and smell. Febrile at 100.6 Completed full treatment at HD. Pt not comuicating. Unknown baseline.  HR 96 100/60 CGB 199

## 2021-12-01 NOTE — Progress Notes (Signed)
IVT consulted for difficult stick.  Upon arrival pt had an IV placed.  RN, Clara requested for a second PIV just in case.  Advised of just in case policy and if need arises to place new consult.  RN understanding.

## 2021-12-02 ENCOUNTER — Inpatient Hospital Stay (HOSPITAL_COMMUNITY): Payer: No Typology Code available for payment source

## 2021-12-02 ENCOUNTER — Other Ambulatory Visit: Payer: Self-pay

## 2021-12-02 DIAGNOSIS — L03115 Cellulitis of right lower limb: Secondary | ICD-10-CM | POA: Diagnosis present

## 2021-12-02 DIAGNOSIS — E1122 Type 2 diabetes mellitus with diabetic chronic kidney disease: Secondary | ICD-10-CM | POA: Diagnosis not present

## 2021-12-02 DIAGNOSIS — N186 End stage renal disease: Secondary | ICD-10-CM | POA: Diagnosis not present

## 2021-12-02 DIAGNOSIS — T8131XA Disruption of external operation (surgical) wound, not elsewhere classified, initial encounter: Secondary | ICD-10-CM | POA: Diagnosis present

## 2021-12-02 DIAGNOSIS — Z992 Dependence on renal dialysis: Secondary | ICD-10-CM | POA: Diagnosis not present

## 2021-12-02 LAB — GLUCOSE, CAPILLARY: Glucose-Capillary: 146 mg/dL — ABNORMAL HIGH (ref 70–99)

## 2021-12-02 LAB — HEPARIN LEVEL (UNFRACTIONATED): Heparin Unfractionated: >1.1 IU/mL — ABNORMAL HIGH (ref 0.30–0.70)

## 2021-12-02 LAB — COMPREHENSIVE METABOLIC PANEL
ALT: 9 U/L (ref 0–44)
AST: 19 U/L (ref 15–41)
Albumin: 2.7 g/dL — ABNORMAL LOW (ref 3.5–5.0)
Alkaline Phosphatase: 76 U/L (ref 38–126)
Anion gap: 17 — ABNORMAL HIGH (ref 5–15)
BUN: 34 mg/dL — ABNORMAL HIGH (ref 8–23)
CO2: 24 mmol/L (ref 22–32)
Calcium: 8.3 mg/dL — ABNORMAL LOW (ref 8.9–10.3)
Chloride: 97 mmol/L — ABNORMAL LOW (ref 98–111)
Creatinine, Ser: 6.63 mg/dL — ABNORMAL HIGH (ref 0.61–1.24)
GFR, Estimated: 8 mL/min — ABNORMAL LOW (ref >60)
Glucose, Bld: 105 mg/dL — ABNORMAL HIGH (ref 70–99)
Potassium: 3.3 mmol/L — ABNORMAL LOW (ref 3.5–5.1)
Sodium: 138 mmol/L (ref 135–145)
Total Bilirubin: 0.3 mg/dL (ref 0.3–1.2)
Total Protein: 7.4 g/dL (ref 6.5–8.1)

## 2021-12-02 LAB — CBG MONITORING, ED
Glucose-Capillary: 102 mg/dL — ABNORMAL HIGH (ref 70–99)
Glucose-Capillary: 171 mg/dL — ABNORMAL HIGH (ref 70–99)
Glucose-Capillary: 148 mg/dL — ABNORMAL HIGH (ref 70–99)

## 2021-12-02 LAB — CBC
HCT: 34.3 % — ABNORMAL LOW (ref 39.0–52.0)
Hemoglobin: 10.4 g/dL — ABNORMAL LOW (ref 13.0–17.0)
MCH: 29.2 pg (ref 26.0–34.0)
MCHC: 30.3 g/dL (ref 30.0–36.0)
MCV: 96.3 fL (ref 80.0–100.0)
Platelets: 161 10*3/uL (ref 150–400)
RBC: 3.56 MIL/uL — ABNORMAL LOW (ref 4.22–5.81)
RDW: 19.2 % — ABNORMAL HIGH (ref 11.5–15.5)
WBC: 9.3 10*3/uL (ref 4.0–10.5)
nRBC: 0 % (ref 0.0–0.2)

## 2021-12-02 LAB — RESP PANEL BY RT-PCR (FLU A&B, COVID) ARPGX2
Influenza A by PCR: NEGATIVE
Influenza B by PCR: NEGATIVE
SARS Coronavirus 2 by RT PCR: NEGATIVE

## 2021-12-02 LAB — APTT: aPTT: 52 seconds — ABNORMAL HIGH (ref 24–36)

## 2021-12-02 MED ORDER — MORPHINE SULFATE (PF) 2 MG/ML IV SOLN: 2.0000 mg | INTRAVENOUS | Status: DC | PRN

## 2021-12-02 MED ORDER — IOHEXOL 350 MG/ML SOLN
100.0000 mL | Freq: Once | INTRAVENOUS | Status: AC | PRN
  Administered 2021-12-02: 100 mL via INTRAVENOUS

## 2021-12-02 MED ORDER — INSULIN ASPART 100 UNIT/ML IJ SOLN
0.0000 [IU] | Freq: Three times a day (TID) | INTRAMUSCULAR | Status: DC
  Administered 2021-12-02 – 2021-12-05 (×3): 1 [IU] via SUBCUTANEOUS

## 2021-12-02 MED ORDER — CHLORHEXIDINE GLUCONATE CLOTH 2 % EX PADS
6.0000 | MEDICATED_PAD | Freq: Every day | CUTANEOUS | Status: DC
  Administered 2021-12-02: 6 via TOPICAL

## 2021-12-02 MED ORDER — HEPARIN (PORCINE) 25000 UT/250ML-% IV SOLN
1100.0000 [IU]/h | INTRAVENOUS | Status: DC
  Administered 2021-12-02: 11:00:00 850 [IU]/h via INTRAVENOUS
  Administered 2021-12-03: 1100 [IU]/h via INTRAVENOUS
  Filled 2021-12-02 (×2): qty 250

## 2021-12-02 MED ORDER — OXYCODONE-ACETAMINOPHEN 5-325 MG PO TABS
1.0000 | ORAL_TABLET | Freq: Four times a day (QID) | ORAL | Status: DC | PRN
  Administered 2021-12-02 – 2021-12-06 (×7): 2 via ORAL
  Filled 2021-12-02 (×7): qty 2

## 2021-12-02 NOTE — ED Notes (Signed)
Pt c/o 9/10 right leg pain. PT refused tylenol earlier due to not helping alleviate present pain. MD Alcario Drought made aware, Percocet ordered and to be given by RN .

## 2021-12-02 NOTE — Progress Notes (Addendum)
Notified MD Bonnielee Haff this patient potassium level is 2.9, MD said patient received potassium. It is not in the [facility] as given or ordered- MD notified and no orders given.  Donnetta Simpers BSN, RN

## 2021-12-02 NOTE — Progress Notes (Signed)
[place] for heparin Indication: DVT  Allergies  Allergen Reactions   Sildenafil Other (See Comments)    Pt stated, "Makes my Blood Pressure drop"    Patient Measurements: Height: 5\' 6"  (167.6 cm) Weight: 50.8 kg (112 lb) IBW/kg (Calculated) : 63.8 Heparin Dosing Weight: 50.8 kg   Vital Signs: Temp: 99.8 F (37.7 C) (02/18 1928) Temp Source: Oral (02/18 1928) BP: 147/70 (02/18 1928) Pulse Rate: 85 (02/18 1928)  Labs: Recent Labs    12/01/21 1815 12/02/21 0425 12/02/21 1849  HGB 11.2* 10.4*  --   HCT 34.4* 34.3*  --   PLT 177 161  --   APTT 40*  --  52*  LABPROT 16.3*  --   --   INR 1.3*  --   --   HEPARINUNFRC  --   --  >1.10*  CREATININE 5.83* 6.63*  --      Estimated Creatinine Clearance: 7.2 mL/min (A) (by C-G formula based on SCr of 6.63 mg/dL (H)).   Medical History: Past Medical History:  Diagnosis Date   Anemia    Blood dyscrasia    history of coagulation deficit per [facility] LPN nursing home   Cancer [facility])    Lung   Diabetes mellitus    Hepatitis    Hypertension    Renal disorder    MWF- [facility]    Medications:  Medications Prior to Admission  Medication Sig Dispense Refill Last Dose   acetaminophen (TYLENOL) 325 MG tablet Take 650 mg by mouth every 6 (six) hours.      amLODipine (NORVASC) 10 MG tablet Take 1 tablet (10 mg total) by mouth daily.      apixaban (ELIQUIS) 5 MG TABS tablet Take 1 tablet (5 mg total) by mouth 2 (two) times daily. 60 tablet     aspirin 81 MG chewable tablet Chew 81 mg by mouth daily. (0800)      calcium acetate (PHOSLO) 667 MG capsule Take 667 mg by mouth 3 (three) times daily with meals. (0800, 1300 & 1700)      ferrous sulfate 325 (65 FE) MG tablet Take 325 mg by mouth daily with breakfast.      hydrocortisone (ANUSOL-HC) 25 MG suppository Place 25 mg rectally 2 (two) times daily as needed for hemorrhoids or anal itching.      lidocaine-prilocaine (EMLA) cream  Apply 1 application topically See admin instructions. As needed on dialysis days.(Monday,Wednesday and Friday)      Multiple Vitamin (MULITIVITAMIN WITH MINERALS) TABS Take 1 tablet by mouth daily. 30 tablet 0    Nutritional Supplements (NOVASOURCE RENAL PO) Take 237 mLs by mouth daily.      oxyCODONE (OXY IR/ROXICODONE) 5 MG immediate release tablet Take 1 tablet (5 mg total) by mouth 3 (three) times daily as needed for up to 4 doses for severe pain. 4 tablet 0    pantoprazole (PROTONIX) 40 MG tablet Take 1 tablet (40 mg total) by mouth 2 (two) times daily.      polyethylene glycol (MIRALAX / GLYCOLAX) 17 g packet Take 17 g by mouth 2 (two) times daily. 14 each 0    senna (SENOKOT) 8.6 MG TABS tablet Take 1 tablet (8.6 mg total) by mouth daily. 7 tablet 0     Assessment: 38 YOM with h/o of ESRD on HD and lower extremity DVT on Eliquis. Pharmacy consulted to dose IV heparin -aPTT= 52 on 850 units/hr -heparin level elevated from recent Eliquis   Goal  of Therapy:  Heparin level 0.3-0.7 units/ml aPTT 66-102s seconds Monitor platelets by anticoagulation protocol: Yes   Plan:  -Increase heparin to 1000 units/hr -Heparin level in 6 hours and daily wth CBC daily  Hildred Laser, PharmD Clinical Pharmacist **Pharmacist phone directory can now be found on [email or web address] ([email or web address]).  Listed under [place].

## 2021-12-02 NOTE — Progress Notes (Signed)
New Admission Note:   Arrival Method: Stretcher Mental Orientation: Alert and oriented  Telemetry: Box 4  Assessment: Completed Skin: Bilateral BKA, stump has wound  TH:YHOO forearm  Pain: 0/10  Tubes: none  Safety Measures: Safety Fall Prevention Plan has been given, discussed and signed Admission: Completed [facility] Orientation: Patient has been orientated to the room, unit and staff.  Family: None   Orders have been reviewed and implemented. Will continue to monitor the patient. Call light has been placed within reach and bed alarm has been activated.   Almeter Westhoff RN [facility] Phone: [phone]

## 2021-12-02 NOTE — Assessment & Plan Note (Addendum)
HbA1c 6.6 in January. Not noted to be on any glucose lowering agents at home.  CBGs are reasonably well controlled.

## 2021-12-02 NOTE — Assessment & Plan Note (Addendum)
Patient on Eliquis prior to admission which is currently on hold.  He was diagnosed with acute DVT in October 2022.  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 He was transitioned to IV heparin.  Was held for surgery.  Can be resumed today. Transition back to Eliquis once cleared to do so by vascular surgery.

## 2021-12-02 NOTE — TOC Initial Note (Signed)
Transition of Care [facility]) - Initial/Assessment Note    Patient Details  Name: Roger Vazquez MRN: [id number] Date of Birth: 08-Jun-1951  Transition of [facility]) CM/SW Contact:    Verdell Carmine, RN Phone Number: 12/02/2021, 9:03 AM  Clinical Narrative:                  The Transition of Care Department [facility]) has reviewed patient and no TOC needs have been identified at this time. We will continue to monitor patient advancement through interdisciplinary progression rounds. If new patient transition needs arise, please place a TOC consult        Patient Goals and CMS Choice        Expected Discharge Plan and Services                                                Prior Living Arrangements/Services                       Activities of Daily Living      Permission Sought/Granted                  Emotional Assessment              Admission diagnosis:  Cellulitis [L03.90] Patient Active Problem List   Diagnosis Date Noted   Cellulitis 12/01/2021   Diverticulosis of colon without hemorrhage    Acute gastric ulcer without hemorrhage or perforation    Acute osteomyelitis of right calcaneus ([place])    Chronic anticoagulation    Heel ulcer, right, with unspecified severity ([place]) 10/13/2021   DVT (deep venous thrombosis) ([place]) 10/13/2021   Lymphadenopathy 10/13/2021   COVID-19 03/47/4259   Acute metabolic encephalopathy 56/38/7564   COVID-19 virus infection 08/12/2021   Peripheral vascular disease ([place]) 06/09/2021   S/P BKA (below knee amputation), left ([place]) 06/08/2021   Status post amputation of foot through metatarsal bone ([place])    Altered mental status 05/19/2021   Left foot infection    Alcohol abuse 05/16/2021   Erectile dysfunction 05/16/2021   Hydronephrosis 05/16/2021   Acute hepatitis C 05/16/2021   Diabetic retinopathy ([place]) 05/16/2021   History of hemodialysis 05/16/2021   Iron deficiency anemia 05/16/2021   Malignant  neoplasm of upper lobe, left bronchus or lung ([place]) 05/16/2021   Noncompliance with medication regimen 05/16/2021   Non-small cell lung cancer ([place]) 05/16/2021   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   Vitreous hemorrhage, left eye ([place]) 05/16/2021   Cellulitis of left lower extremity    Gangrene ([place]) 04/27/2021   Diarrhea, unspecified 04/29/2019   Mild protein-calorie malnutrition ([place]) 08/20/2018   Fever    Infection of AV graft for dialysis ([place]) 08/05/2018   Symptomatic anemia 08/05/2018   Hypokalemia 08/05/2018   GERD (gastroesophageal reflux disease) 08/05/2018   Sepsis due to unspecified Staphylococcus ([place]) 08/02/2018   ESRD on hemodialysis ([place])    Bacteremia due to methicillin susceptible Staphylococcus aureus (MSSA) 07/28/2018   Sepsis ([place]) 07/27/2018   Headache 01/14/2017   Fluid overload, unspecified 12/13/2015   Anemia in chronic kidney disease 12/09/2015   Chronic kidney disease, stage 5 ([place]) 12/09/2015   Coagulation defect, unspecified ([place]) 12/09/2015   Encounter for adjustment and management of vascular access device 12/09/2015   Other secondary pulmonary hypertension ([place]) 12/09/2015   Pruritus, unspecified 12/09/2015  Secondary hyperparathyroidism of renal origin ([place]) 12/09/2015   Shortness of breath 12/09/2015   Abdominal pain, other specified site 10/07/2011   Nausea and vomiting 10/07/2011   VITAMIN D DEFICIENCY 03/19/2008   FRACTURE, TIBIA 02/09/2008   HTN (hypertension) 11/25/2007   HEPATITIS C 05/27/2007   Type 2 diabetes mellitus with chronic kidney disease on chronic dialysis ([place]) 05/27/2007   ERECTILE DYSFUNCTION 05/27/2007   ABUSE, ALCOHOL, IN REMISSION 05/27/2007   ABUSE, OPIOID, IN REMISSION 05/27/2007   ABUSE, COCAINE, IN REMISSION 05/27/2007   PCP:  Clinic, [place]:   [facility] [address], [address] [place]  [id number] Phone: [phone] Fax: [phone]     Social Determinants of Health ([place]) Interventions    Readmission Risk Interventions Readmission Risk Prevention Plan 05/30/2021  Transportation Screening Complete  SW Recovery Care/Counseling Consult Complete  Skilled Nursing Facility Complete  Some recent data might be hidden

## 2021-12-02 NOTE — Assessment & Plan Note (Addendum)
Blood pressure reasonably well controlled.  Noted to be on amlodipine.

## 2021-12-02 NOTE — ED Notes (Signed)
Pt given complete bed bath with linen change. New brief applied. Pt tolerated well.

## 2021-12-02 NOTE — Assessment & Plan Note (Addendum)
He underwent right below-knee amputation in January.  Was noted to be febrile while at dialysis on 2/17.  Concern for dehiscence of wound.  Was sent to the ED.   Seen by vascular surgery.  Wound dehiscence was noted.  No purulence however was appreciated. Underwent CT scan of his right lower extremity which did not show any soft tissue gas.  Significant edema was noted. Patient underwent revision surgery on 2/20.  He refused an above-knee amputation. Remains on vancomycin and cefepime. Apparently purulence was noted at the time of surgery.  Cultures were sent and are pending. Remains afebrile.  WBC has been normal.

## 2021-12-02 NOTE — Progress Notes (Signed)
ANTICOAGULATION CONSULT NOTE - Initial Consult  Pharmacy Consult for heparin Indication: DVT  Allergies  Allergen Reactions   Sildenafil Other (See Comments)    Pt stated, "Makes my Blood Pressure drop"    Patient Measurements: Height: 5\' 6"  (167.6 cm) Weight: 50.8 kg (112 lb) IBW/kg (Calculated) : 63.8 Heparin Dosing Weight: 50.8 kg   Vital Signs: BP: 128/60 (02/18 0930) Pulse Rate: 91 (02/18 0930)  Labs: Recent Labs    12/01/21 1815 12/02/21 0425  HGB 11.2* 10.4*  HCT 34.4* 34.3*  PLT 177 161  APTT 40*  --   LABPROT 16.3*  --   INR 1.3*  --   CREATININE 5.83* 6.63*    Estimated Creatinine Clearance: 7.2 mL/min (A) (by C-G formula based on SCr of 6.63 mg/dL (H)).   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 Pharmacy consulted to transition patient to IV heparin  H/H low, Plt wnl   Goal of Therapy:  Heparin level 0.3-0.7 units/ml aPTT 66-102s seconds Monitor platelets by anticoagulation protocol: Yes   Plan:  -Start heparin infusion at 850 units/hr -F/u 8 hr aPTT/HL -Monitor daily HL, CBC and watch for s/s of bleeding   Albertina Parr, PharmD., BCCCP Clinical Pharmacist Please refer to [facility] for unit-specific pharmacist

## 2021-12-02 NOTE — Progress Notes (Signed)
A consult was placed to IV Therapy to place a 2nd iv site for more access; pt is on a heparin drip and will be receiving other iv meds, not compatible with heparin.  Pt is limited to the Left arm for all labs, IVs;  pt with very poor peripheral access; CrCl 10; is not a candidate for a PICC line;  pt has 2 iv sites currently, but may need other access at some point if these 2 sites don't last.

## 2021-12-02 NOTE — Assessment & Plan Note (Addendum)
Noted to be encephalopathic at the time of admission.  CT head was unremarkable.  No focal neurological deficits noted. Mentation seems to have improved.  He is likely back to his baseline now.

## 2021-12-02 NOTE — ED Notes (Signed)
This RN called 36M at this time to check on status of purple man d/t bed being ready for 40 minutes. Per Network engineer, Agricultural consultant on lunch but is not going to approve pt d/t floor being capped currently.

## 2021-12-02 NOTE — Assessment & Plan Note (Signed)
Hemoglobin noted to be low but stable.  Continue to monitor.

## 2021-12-02 NOTE — H&P (Signed)
History and Physical    Roger Vazquez NWG:[id number] DOB: December 24, 1950 DOA: 12/01/2021  PCP: [facility]  Patient coming from: Home.  Chief Complaint: Fever and right foot wound dehiscence.  HPI: Roger Vazquez is a 71 y.o. 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  ED Course: In the ER patient was afebrile x-rays do not show any signs of osteomyelitis but does show soft tissue infection.  Patient was evaluated by vascular surgery and at this time felt that patient's pulm was infected with need further debridement and recommended admission and also CT scan.  Patient appeared mildly somnolent and lethargic but was able to answer questions.  CT head was unremarkable.  Had cultures drawn and started on empiric antibiotics.  COVID test is pending.  Review of Systems: As per HPI, rest all negative.   Past Medical History:  Diagnosis Date   Anemia    Blood dyscrasia    history of coagulation deficit per [facility] nursing home   Cancer [facility])    Lung   Diabetes mellitus    Hepatitis    Hypertension    Renal disorder    MWF- [place]    Past Surgical History:  Procedure Laterality Date   ABDOMINAL AORTOGRAM W/LOWER EXTREMITY Left 04/28/2021   Procedure: ABDOMINAL AORTOGRAM W/LOWER EXTREMITY;  Surgeon: Cherre Robins, MD;  Location: [place] [facility];  Service: Cardiovascular;  Laterality: Left;   AMPUTATION Left 05/04/2021   Procedure: LEFT SECOND TOE AMPUTATION;  Surgeon: Cherre Robins, MD;  Location: [place];  Service: Vascular;  Laterality: Left;   AMPUTATION Left 06/08/2021   Procedure: LEFT BELOW KNEE AMPUTATION;  Surgeon: Cherre Robins, MD;  Location: [place];  Service: Vascular;  Laterality: Left;   AMPUTATION Right 10/20/2021   Procedure: RIGHT BELOW KNEE AMPUTATION;  Surgeon: Cherre Robins, MD;  Location: [place];  Service: Vascular;  Laterality: Right;   APPLICATION OF WOUND VAC Left 06/08/2021   Procedure: APPLICATION OF WOUND VAC;  Surgeon: Cherre Robins, MD;  Location: [place];  Service: Vascular;  Laterality: Left;   APPLICATION OF WOUND VAC Right 10/20/2021   Procedure: APPLICATION OF WOUND VAC;  Surgeon: Cherre Robins, MD;  Location: [place];  Service: Vascular;  Laterality: Right;   artiovenous graft placement Left    AV FISTULA PLACEMENT Right 11/27/2018   Procedure: INSERTION 4-7MM X 45CM GORETEX GRAFT RIGHT FOREARM ;  Surgeon: Rosetta Posner, MD;  Location: [place];  Service: Vascular;  Laterality: Right;   [place] REMOVAL Left 08/06/2018   Procedure: REMOVAL OF LEFT ARM GRAFT;  Surgeon: Rosetta Posner, MD;  Location: [place];  Service: Vascular;  Laterality: Left;   BIOPSY  10/17/2021   Procedure: BIOPSY;  Surgeon: Irene Shipper, MD;  Location: [place];  Service: Endoscopy;;   COLONOSCOPY WITH PROPOFOL N/A 10/17/2021   Procedure: COLONOSCOPY WITH PROPOFOL;  Surgeon: Irene Shipper, MD;  Location: [place];  Service: Endoscopy;  Laterality: N/A;   ESOPHAGOGASTRODUODENOSCOPY (EGD) WITH PROPOFOL N/A 10/17/2021   Procedure: ESOPHAGOGASTRODUODENOSCOPY (EGD) WITH PROPOFOL;  Surgeon: Irene Shipper, MD;  Location: [facility] END[facility]COPY;  Service: Endoscopy;  Laterality: N/A;  INSERTION OF DIALYSIS CATHETER Right 08/08/2018   Procedure: INSERTION OF TUNNELED  DIALYSIS CATHETER;  Surgeon: Marty Heck, MD;  Location: [place];  Service: Vascular;  Laterality: Right;   IR DIALY SHUNT INTRO NEEDLE/INTRACATH INITIAL W/IMG RIGHT Right 05/26/2021   IR REMOVAL TUN CV CATH W/O FL  02/19/2019   IR US GUIDE VASC ACCESS RIGHT  05/26/2021   LUNG SURGERY     patient not if they removed part of lung. Was done at [place] Left 04/28/2021   Procedure: PERIPHERAL VASCULAR BALLOON ANGIOPLASTY;  Surgeon: Cherre Robins, MD;  Location: [place] [facility];  Service: Cardiovascular;  Laterality: Left;  popliteal and posterial tibial   TEE WITHOUT CARDIOVERSION N/A 07/31/2018   Procedure: TRANSESOPHAGEAL ECHOCARDIOGRAM (TEE);  Surgeon: Pixie Casino, MD;  Location: [facility] ENDOSCOPY;  Service: Cardiovascular;  Laterality: N/A;   TRANSMETATARSAL AMPUTATION Left 05/22/2021   Procedure: LEFT TRANSMETATARSAL AMPUTATION;  Surgeon: Elam Dutch, MD;  Location: [facility];  Service: Vascular;  Laterality: Left;     reports that he quit smoking about 22 years ago. His smoking use included cigarettes. He has a 5.00 pack-year smoking history. He quit smokeless tobacco use about 6 years ago. He reports that he does not currently use alcohol after a past usage of about 2.0 standard drinks per week. He reports that he does not currently use drugs after having used the following drugs: Marijuana.  Allergies  Allergen Reactions   Sildenafil Other (See Comments)    Pt stated, "Makes my Blood Pressure drop"    History reviewed. No pertinent family history.  Prior to Admission medications   Medication Sig Start Date End Date Taking? Authorizing Provider  acetaminophen (TYLENOL) 325 MG tablet Take 650 mg by mouth every 6 (six) hours.    [provider]  amLODipine (NORVASC) 10 MG tablet Take 1 tablet (10 mg total) by mouth daily. 08/16/21   Ghimire, Henreitta Leber, MD  apixaban (ELIQUIS) 5 MG TABS tablet Take 1 tablet (5 mg total) by mouth 2 (two) times daily. 08/19/21   Ghimire, Henreitta Leber, MD  aspirin 81 MG chewable tablet Chew 81 mg by mouth daily. (0800)    [provider]  calcium acetate (PHOSLO) 667 MG capsule Take 667 mg by mouth 3 (three) times daily with meals. (0800, 1300 & 1700)    [provider]  ferrous sulfate 325 (65 FE) MG tablet Take 325 mg by mouth daily with  breakfast.    [provider]  hydrocortisone (ANUSOL-HC) 25 MG suppository Place 25 mg rectally 2 (two) times daily as needed for hemorrhoids or anal itching.    [provider]  lidocaine-prilocaine (EMLA) cream Apply 1 application topically See admin instructions. As needed on dialysis days.Laurine Blazer and Friday) 04/28/21   [provider]  Multiple Vitamin (MULITIVITAMIN WITH MINERALS) TABS Take 1 tablet by mouth daily. 10/12/11   Robbie Lis, MD  Nutritional Supplements (NOVASOURCE RENAL PO) Take 237 mLs by mouth daily.    [provider]  oxyCODONE (OXY IR/ROXICODONE) 5 MG immediate release tablet Take 1 tablet (5 mg total) by mouth 3 (three) times daily as needed for up to 4 doses for severe pain. 10/24/21   Antonieta Pert, MD  pantoprazole (PROTONIX) 40 MG tablet Take 1 tablet (40 mg total) by mouth 2 (two) times daily. 10/24/21   Antonieta Pert, MD  polyethylene glycol (MIRALAX / GLYCOLAX) 17 g packet Take 17 g by mouth 2 (  two) times daily. 05/05/21   Ezequiel Essex, MD  senna (SENOKOT) 8.6 MG TABS tablet Take 1 tablet (8.6 mg total) by mouth daily. 05/05/21   Ezequiel Essex, MD    Physical Exam: Constitutional: Moderately built and nourished. Vitals:   12/01/21 1957 12/01/21 2006 12/01/21 2100 12/01/21 2115  BP: (!) 120/56  (!) 121/55 119/60  Pulse:  91    Resp:  16  16  Temp:  98.7 F (37.1 C)    TempSrc:      SpO2:  99%  100%  Weight:      Height:       Eyes: Anicteric no pallor. ENMT: No discharge from the ears eyes nose and mouth. Neck: No mass felt.  No neck rigidity. Respiratory: No rhonchi or crepitations. Cardiovascular: S1-S2 heard. Abdomen: Soft nontender bowel sounds present. Musculoskeletal: Right lower extremity is dressed at this time. Skin: Right lower extremity is having dressed. Neurologic: Patient is mildly somnolent but arousable answers questions moving all extremities. Psychiatric: Mildly somnolent.   Labs on  Admission: I have personally reviewed following labs and imaging studies  CBC: Recent Labs  Lab 12/01/21 1815  WBC 11.0*  NEUTROABS 9.0*  HGB 11.2*  HCT 34.4*  MCV 94.0  PLT 295   Basic Metabolic Panel: Recent Labs  Lab 12/01/21 1815  NA 136  K 3.0*  CL 94*  CO2 26  GLUCOSE 147*  BUN 26*  CREATININE 5.83*  CALCIUM 8.3*   GFR: Estimated Creatinine Clearance: 8.2 mL/min (A) (by C-G formula based on SCr of 5.83 mg/dL (H)). Liver Function Tests: Recent Labs  Lab 12/01/21 1815  AST 22  ALT 12  ALKPHOS 89  BILITOT 0.7  PROT 8.6*  ALBUMIN 3.2*   No results for input(s): LIPASE, AMYLASE in the last 168 hours. Recent Labs  Lab 12/01/21 1815  AMMONIA 17   Coagulation Profile: Recent Labs  Lab 12/01/21 1815  INR 1.3*   Cardiac Enzymes: No results for input(s): CKTOTAL, CKMB, CKMBINDEX, TROPONINI in the last 168 hours. BNP (last 3 results) No results for input(s): PROBNP in the last 8760 hours. HbA1C: No results for input(s): HGBA1C in the last 72 hours. CBG: Recent Labs  Lab 12/01/21 2011  GLUCAP 124*   Lipid Profile: No results for input(s): CHOL, HDL, LDLCALC, TRIG, CHOLHDL, LDLDIRECT in the last 72 hours. Thyroid Function Tests: No results for input(s): TSH, T4TOTAL, FREET4, T3FREE, THYROIDAB in the last 72 hours. Anemia Panel: No results for input(s): VITAMINB12, FOLATE, FERRITIN, TIBC, IRON, RETICCTPCT in the last 72 hours. Urine analysis:    Component Value Date/Time   COLORURINE YELLOW 10/14/2021 1040   APPEARANCEUR HAZY (A) 10/14/2021 1040   LABSPEC 1.009 10/14/2021 1040   PHURINE 9.0 (H) 10/14/2021 1040   GLUCOSEU 50 (A) 10/14/2021 1040   HGBUR MODERATE (A) 10/14/2021 1040   BILIRUBINUR NEGATIVE 10/14/2021 1040   KETONESUR NEGATIVE 10/14/2021 1040   PROTEINUR 100 (A) 10/14/2021 1040   UROBILINOGEN 0.2 10/07/2011 1618   NITRITE NEGATIVE 10/14/2021 1040   LEUKOCYTESUR LARGE (A) 10/14/2021 1040   Sepsis  Labs: @LABRCNTIP (procalcitonin:4,lacticidven:4) )No results found for this or any previous visit (from the past 240 hour(s)).   Radiological Exams on Admission: DG Tibia/Fibula Right  Result Date: 12/01/2021 CLINICAL DATA:  Postop infection. EXAM: RIGHT TIBIA AND FIBULA - 2 VIEW COMPARISON:  None. FINDINGS: Below the knee amputation. Intramedullary rod with proximal locking screws in the proximal tibia. Tibia and fibular resection margins are smooth. No erosions, periosteal reaction, or bone destruction.  Mild skin and soft tissue irregularity about the anterior aspect of the distal stump. No tracking soft tissue air. No radiopaque foreign body. The bones are under mineralized. Vascular calcifications are seen. IMPRESSION: 1. Below the knee amputation with intramedullary rod in the proximal tibia. No radiographic findings of osteomyelitis. 2. Mild skin and soft tissue irregularity about the anterior aspect of the distal stump may represent postsurgical change or infection. Electronically Signed   By: Keith Rake M.D.   On: 12/01/2021 17:24   CT Head Wo Contrast  Result Date: 12/01/2021 CLINICAL DATA:  Mental status changes. EXAM: CT HEAD WITHOUT CONTRAST TECHNIQUE: Contiguous axial images were obtained from the base of the skull through the vertex without intravenous contrast. RADIATION DOSE REDUCTION: This exam was performed according to the departmental dose-optimization program which includes automated exposure control, adjustment of the mA and/or kV according to patient size and/or use of iterative reconstruction technique. COMPARISON:  08/11/2021 FINDINGS: Brain: There is no evidence for acute hemorrhage, hydrocephalus, mass lesion, or abnormal extra-axial fluid collection. No definite CT evidence for acute infarction. Diffuse loss of parenchymal volume is consistent with atrophy. Patchy low attenuation in the deep hemispheric and periventricular white matter is nonspecific, but likely reflects  chronic microvascular ischemic demyelination. 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 Vascular: No hyperdense vessel or unexpected calcification. Skull: Normal. Negative for fracture or focal lesion. Sinuses/Orbits: The visualized paranasal sinuses and mastoid air cells are clear. Visualized portions of the globes and intraorbital fat are unremarkable. Other: None IMPRESSION: 1. No acute intracranial abnormality. 2. Atrophy with chronic small vessel ischemic disease. Electronically Signed   By: Misty Stanley M.D.   On: 12/01/2021 19:05   DG Chest Port 1 View  Result Date: 12/01/2021 CLINICAL DATA:  Postoperative infection EXAM: PORTABLE CHEST 1 VIEW COMPARISON:  08/13/2021 FINDINGS: Central line tip in the upper right atrium. Heart size is normal. Chronic aortic atherosclerosis. Previous lobectomy on the left. No sign of pulmonary infiltrate. No visible effusion. Old healed rib fracture on the left. Left axillary region vascular stent. IMPRESSION: No active disease. Electronically Signed   By: Nelson Chimes M.D.   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  Appreciate vascular surgery consult recommendation.  CT scan has been ordered as requested.  On empiric antibiotics. Acute encephalopathy/lethargy appears nonfocal CT head unremarkable could be from fever.  Closely monitor. ESRD on hemodialysis consult nephrology for dialysis. Hypertension on amlodipine. Diabetes mellitus type 2 we will keep patient on very sensitive sliding scale. 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  Note that patient recently had GI bleed for which patient underwent EGD and it did show gastric ulcer we will continue with PPI.  This  was done last month. History of DVT on Eliquis which we will hold in anticipation of possible procedure and keep patient on heparin.  Since patient has infected stump requiring procedure will need close monitoring and inpatient status.  COVID test is pending.   DVT prophylaxis: Heparin infusion. Code Status: Full code. Family Communication: Discussed with patient. Disposition Plan: To be determined. Consults called: Vascular surgery. Admission status: Inpatient.   [name] MD [facility] Pager [phone].  If 7PM-7AM, please contact night-coverage [email or web address]  Password [email or web address]  12/02/2021, 2:23 AM

## 2021-12-02 NOTE — Assessment & Plan Note (Addendum)
Nephrology is following.  He is dialyzed on Monday Wednesday Friday schedule.

## 2021-12-02 NOTE — Hospital Course (Addendum)
71 y.o. 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  He was transferred to hospital.  Evaluated by vascular surgery.  Was hospitalized for further management.  He was offered above-knee amputation since chances of healing would be higher.  But patient refused and wanted to just do a revision of his BKA.  This was performed on 2/20

## 2021-12-02 NOTE — Progress Notes (Signed)
[facility] PROGRESS NOTE   Roger Vazquez ZMO:[id number] DOB: [birth year] DOA: 12/01/2021  1 DOS: the patient was seen and examined on 12/02/2021  PCP: [facility]  Brief History and Hospital Course:  71 y.o. 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  He was transferred to hospital.  Evaluated by vascular surgery.  Was hospitalized for further management.  Consultants: Vascular surgery.  Nephrology will be notified tomorrow.  Procedures: None yet    Subjective: Patient noted to be lethargic but easily arousable.  Mentions that he does not have any significant pain in the right leg currently.  Denies any chest pain shortness of breath.  No nausea vomiting.    Assessment/Plan:  * Cellulitis of right lower extremity- (present on admission) Underwent right below-knee amputation in January.  Was noted to be febrile while at dialysis on 2/17.  Concern for dehiscence of wound.  Was sent to the ED.   Seen by vascular surgery.  Wound dehiscence was noted.  No purulence however was appreciated. Underwent CT scan of his right lower extremity which did not show any soft tissue gas.  Significant edema was noted. Patient admitted to the hospital.  He will likely need revision surgery this admission.   Remains on vancomycin and cefepime for now.  WBC was 11.0 yesterday.  Noted to be normal today.  He was febrile at the dialysis center but no fever noted here.  Follow-up on blood cultures.   Acute metabolic encephalopathy- (present on admission) Noted to be encephalopathic yesterday.  Likely from infection and dialysis.  He apparently got very fatigued.   Mentation appears to be better this morning.  CT head was unremarkable.  No focal neurological deficits.  ESRD on hemodialysis [facility]) Dialyzed on Monday Wednesday Friday schedule.  He underwent full dialysis yesterday.  Next [facility] will be on Monday.  We will notify nephrology tomorrow.  Electrolytes are stable.  HTN (hypertension)- (present on admission) Monitor blood pressures closely.  Is noted to be on amlodipine which is being continued.  DVT (deep venous thrombosis) ([place])- (present on admission) Patient on Eliquis prior to admission which is currently on hold due to need for surgery in the near future.  He was diagnosed with acute DVT in October 2022.  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  Monitor CBGs.  On SSI.  Not noted to be on any glucose lowering agents at home.  Anemia in chronic kidney disease- (present on admission) Hemoglobin noted to be low but stable.  Continue to monitor.     DVT Prophylaxis: Eliquis on hold.  To be on IV heparin Code Status: Full code Family Communication: Discussed with patient.  No family at bedside. Disposition Plan: Hopefully return home when improved  Status is: Inpatient Remains inpatient appropriate because: Cellulitis right lower extremity, wound dehiscence      Medications: Scheduled:  acetaminophen  650 mg Oral Q6H   amLODipine  10 mg Oral Daily   calcium acetate  667 mg Oral TID WC   insulin aspart  0-6 Units Subcutaneous TID WC   pantoprazole  40 mg Oral BID   Continuous:  ceFEPime (MAXIPIME) IV Stopped (12/01/21 2201)   [START ON 12/04/2021] vancomycin     VOZ:[id number]  Antibiotics: Anti-infectives (From admission, onward)    Start     Dose/Rate Route Frequency Ordered Stop   12/04/21 1200  vancomycin (VANCOREADY) IVPB 500 mg/100 mL  Status:  Discontinued         500 mg 100 mL/hr over 60 Minutes Intravenous Every M-W-F (Hemodialysis) 12/01/21 1803 12/01/21 2039   12/04/21 1200  vancomycin (VANCOREADY) IVPB 500 mg/100 mL        500 mg 100 mL/hr over 60 Minutes Intravenous Every M-W-F (Hemodialysis) 12/01/21 2039 12/08/21 1159   12/01/21 2200  ceFEPIme (MAXIPIME) 1 g in sodium chloride 0.9 % 100 mL IVPB        1 g 200 mL/hr over 30 Minutes Intravenous Every 24 hours 12/01/21 2037 12/08/21 2159   12/01/21 2030  ceFEPIme (MAXIPIME) 2 g in sodium chloride 0.9 % 100 mL IVPB  Status:  Discontinued        2 g 200 mL/hr over 30 Minutes Intravenous  Once 12/01/21 2026 12/01/21 2037   12/01/21 1715  vancomycin (VANCOCIN) IVPB 1000 mg/200 mL premix  Status:  Discontinued        1,000 mg 200 mL/hr over 60 Minutes Intravenous  Once 12/01/21 1702 12/01/21 1921   12/01/21 1715  cefTRIAXone (ROCEPHIN) 2 g in sodium chloride 0.9 % 100 mL IVPB        2 g 200 mL/hr over 30 Minutes Intravenous  Once 12/01/21 1702 12/01/21 1925       Objective:  Vital Signs  Vitals:   12/02/21 0545 12/02/21 0630 12/02/21 0715 12/02/21 0918  BP: 139/61 (!) 120/58 128/65 (!) 150/66  Pulse: 93  83   Resp: 14 18 12    Temp:      TempSrc:      SpO2: 99%  93%   Weight:      Height:        Intake/Output Summary (Last 24 hours) at 12/02/2021 0930 Last data filed at 12/01/2021 1925 Gross per 24 hour  Intake 98.71 ml  Output --  Net 98.71 ml   Filed Weights   12/01/21 1700  Weight: 50.8 kg    General appearance: Fatigued but easily arousable.  Able to communicate well.  In no distress Resp: Clear to auscultation bilaterally.  Normal effort Cardio: S1-S2 is normal regular.  No S3-S4.  No rubs murmurs or bruit GI: Abdomen is soft.  Nontender nondistended.  Bowel sounds are present normal.  No masses organomegaly Extremities: Bilateral BKA is noted.  Right stump covered in dressing. Neurologic:   No focal neurological deficits.    Lab Results:  Data Reviewed: I have  personally reviewed labs and imaging study reports  CBC: Recent Labs  Lab 12/01/21 1815 12/02/21 0425  WBC 11.0* 9.3  NEUTROABS 9.0*  --   HGB 11.2* 10.4*  HCT 34.4* 34.3*  MCV 94.0 96.3  PLT 177 329    Basic Metabolic Panel: Recent Labs  Lab 12/01/21 1815 12/02/21 0425  NA 136 138  K 3.0* 3.3*  CL 94* 97*  CO2 26 24  GLUCOSE 147* 105*  BUN 26* 34*  CREATININE 5.83* 6.63*  CALCIUM 8.3* 8.3*    GFR: Estimated Creatinine Clearance: 7.2 mL/min (A) (by C-G formula based on SCr of 6.63 mg/dL (H)).  Liver Function Tests: Recent Labs  Lab 12/01/21  1815 12/02/21 0425  AST 22 19  ALT 12 9  ALKPHOS 89 76  BILITOT 0.7 0.3  PROT 8.6* 7.4  ALBUMIN 3.2* 2.7*     Recent Labs  Lab 12/01/21 1815  AMMONIA 17    Coagulation Profile: Recent Labs  Lab 12/01/21 1815  INR 1.3*      CBG: Recent Labs  Lab 12/01/21 2011 12/02/21 0818  GLUCAP 124* 102*     Recent Results (from the past 240 hour(s))  Resp Panel by RT-PCR (Flu A&B, Covid) Nasopharyngeal Swab     Status: None   Collection Time: 12/02/21  3:30 AM   Specimen: Nasopharyngeal Swab; Nasopharyngeal(NP) swabs in vial transport medium  Result Value Ref Range Status   SARS Coronavirus 2 by RT PCR NEGATIVE NEGATIVE Final    Comment: (NOTE) SARS-CoV-2 target nucleic acids are NOT DETECTED.  The SARS-CoV-2 RNA is generally detectable in upper respiratory specimens during the acute phase of infection. The lowest concentration of SARS-CoV-2 viral copies this assay can detect is 138 copies/mL. A negative result does not preclude SARS-Cov-2 infection and should not be used as the sole basis for treatment or other patient management decisions. A negative result may occur with  improper specimen collection/handling, submission of specimen other than nasopharyngeal swab, presence of viral mutation(s) within the areas targeted by this assay, and inadequate number of viral copies(<138 copies/mL). A negative  result must be combined with clinical observations, patient history, and epidemiological information. The expected result is Negative.  Fact Sheet for Patients:  EntrepreneurPulse.com.au  Fact Sheet for Healthcare Providers:  IncredibleEmployment.be  This test is no t yet approved or cleared by the [place] FDA and  has been authorized for detection and/or diagnosis of SARS-CoV-2 by FDA under an Emergency Use Authorization (EUA). This EUA will remain  in effect (meaning this test can be used) for the duration of the COVID-19 declaration under Section 564(b)(1) of the Act, 21 U.S.C.section 360bbb-3(b)(1), unless the authorization is terminated  or revoked sooner.       Influenza A by PCR NEGATIVE NEGATIVE Final   Influenza B by PCR NEGATIVE NEGATIVE Final    Comment: (NOTE) The Xpert Xpress SARS-CoV-2/FLU/RSV plus assay is intended as an aid in the diagnosis of influenza from Nasopharyngeal swab specimens and should not be used as a sole basis for treatment. Nasal washings and aspirates are unacceptable for Xpert Xpress SARS-CoV-2/FLU/RSV testing.  Fact Sheet for Patients: EntrepreneurPulse.com.au  Fact Sheet for Healthcare Providers: IncredibleEmployment.be  This test is not yet approved or cleared by the [place] FDA and has been authorized for detection and/or diagnosis of SARS-CoV-2 by FDA under an Emergency Use Authorization (EUA). This EUA will remain in effect (meaning this test can be used) for the duration of the COVID-19 declaration under Section 564(b)(1) of the Act, 21 U.S.C. section 360bbb-3(b)(1), unless the authorization is terminated or revoked.  Performed at [facility], [place] [address] [place], [place] [address]       Radiology Studies: DG Tibia/Fibula Right  Result Date: 12/01/2021 CLINICAL DATA:  Postop infection. EXAM: RIGHT TIBIA AND FIBULA - 2 VIEW  COMPARISON:  None. FINDINGS: Below the knee amputation. Intramedullary rod with proximal locking screws in the proximal tibia. Tibia and fibular resection margins are smooth. No erosions, periosteal reaction, or bone destruction. Mild skin and soft tissue irregularity about the anterior aspect of the distal stump. No tracking soft tissue air. No radiopaque foreign body. The bones are under mineralized. Vascular calcifications are  seen. IMPRESSION: 1. Below the knee amputation with intramedullary rod in the proximal tibia. No radiographic findings of osteomyelitis. 2. Mild skin and soft tissue irregularity about the anterior aspect of the distal stump may represent postsurgical change or infection. Electronically Signed   By: Keith Rake M.D.   On: 12/01/2021 17:24   CT Head Wo Contrast  Result Date: 12/01/2021 CLINICAL DATA:  Mental status changes. EXAM: CT HEAD WITHOUT CONTRAST TECHNIQUE: Contiguous axial images were obtained from the base of the skull through the vertex without intravenous contrast. RADIATION DOSE REDUCTION: This exam was performed according to the departmental dose-optimization program which includes automated exposure control, adjustment of the mA and/or kV according to patient size and/or use of iterative reconstruction technique. COMPARISON:  08/11/2021 FINDINGS: Brain: There is no evidence for acute hemorrhage, hydrocephalus, mass lesion, or abnormal extra-axial fluid collection. No definite CT evidence for acute infarction. Diffuse loss of parenchymal volume is consistent with atrophy. Patchy low attenuation in the deep hemispheric and periventricular white matter is nonspecific, but likely reflects chronic microvascular ischemic demyelination. 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 Vascular: No hyperdense vessel or unexpected calcification. Skull: Normal. Negative for fracture or focal lesion. Sinuses/Orbits: The visualized paranasal sinuses and mastoid air cells are  clear. Visualized portions of the globes and intraorbital fat are unremarkable. Other: None IMPRESSION: 1. No acute intracranial abnormality. 2. Atrophy with chronic small vessel ischemic disease. Electronically Signed   By: Misty Stanley M.D.   On: 12/01/2021 19:05   CT TIBIA FIBULA RIGHT W CONTRAST  Result Date: 12/02/2021 CLINICAL DATA:  Status post below the knee amputation. Signs of soft tissue infection. EXAM: CT OF THE LOWER RIGHT EXTREMITY WITH CONTRAST TECHNIQUE: Multidetector CT imaging of the lower right extremity was performed according to the standard protocol following intravenous contrast administration. RADIATION DOSE REDUCTION: This exam was performed according to the departmental dose-optimization program which includes automated exposure control, adjustment of the mA and/or kV according to patient size and/or use of iterative reconstruction technique. CONTRAST:  133mL OMNIPAQUE IOHEXOL 350 MG/ML SOLN COMPARISON:  None. FINDINGS: Bones/Joint/Cartilage The bones appear osteopenic. The patient is status post below the knee amputation. IM rod and screws are again noted within the remaining portions of the proximal tibia. The tibial resection margin remains distinct without bone erosion or bony fragmentation. Along the fibular resection margin the bone appears slightly irregular and there are areas of cortical fragmentation, image 92/7 and image 108/4. No definite bone erosion or signs of periosteal reaction. Ligaments Suboptimally assessed by CT. Muscles and Tendons There is diffuse low-attenuation edema with enhancement along the fascial planes throughout the muscles and soft tissues overlying the tibial and fibular resection margins. Here, there is a focal area of possible phlegmon formation without distinct fluid collection measuring 7.5 by 3.6 cm, image 125/4 and image 90/8, suspicious for underlying myositis. There is no gas identified within this area. No distinct drainable fluid collection  noted. Soft tissues Skin thickening and subcutaneous soft tissue stranding within the distal aspect of the stump is concerning for cellulitis. IMPRESSION: 1. Status post below the knee amputation. 2. Skin thickening and subcutaneous soft tissue stranding within the distal aspect of the stump is identified. There is large focal area of indistinct, low-attenuation edema, with enhancement along the fascial planes, throughout the muscles overlying the tibial and fibular resection margins. Underlying phlegmon formation cannot be excluded. No soft tissue gas identified. Imaging are concerning for lower extremity cellulitis findings with underlying myositis. 3. Bony fragmentation  along the fibular resection margin is identified. This may reflect postsurgical change but bony destruction due to osteomyelitis cannot be excluded with a high degree of certainty. Electronically Signed   By: Kerby Moors M.D.   On: 12/02/2021 05:57   DG Chest Port 1 View  Result Date: 12/01/2021 CLINICAL DATA:  Postoperative infection EXAM: PORTABLE CHEST 1 VIEW COMPARISON:  08/13/2021 FINDINGS: Central line tip in the upper right atrium. Heart size is normal. Chronic aortic atherosclerosis. Previous lobectomy on the left. No sign of pulmonary infiltrate. No visible effusion. Old healed rib fracture on the left. Left axillary region vascular stent. IMPRESSION: No active disease. Electronically Signed   By: Nelson Chimes M.D.   On: 12/01/2021 17:26       LOS: 1 day   [facility] Pager on [email or web address]  12/02/2021, 9:30 AM

## 2021-12-02 NOTE — Progress Notes (Signed)
Patient has reported not having his phone on arrival to [id number], bedside RN called the ED and spoke with ED RN, she reports that the patient never had a cell phone upon arrival to ed.  Donnetta Simpers BSN, RN

## 2021-12-03 DIAGNOSIS — L03115 Cellulitis of right lower limb: Secondary | ICD-10-CM

## 2021-12-03 DIAGNOSIS — N186 End stage renal disease: Secondary | ICD-10-CM

## 2021-12-03 DIAGNOSIS — Z992 Dependence on renal dialysis: Secondary | ICD-10-CM

## 2021-12-03 DIAGNOSIS — G9341 Metabolic encephalopathy: Secondary | ICD-10-CM | POA: Diagnosis not present

## 2021-12-03 DIAGNOSIS — I10 Essential (primary) hypertension: Secondary | ICD-10-CM

## 2021-12-03 DIAGNOSIS — N390 Urinary tract infection, site not specified: Secondary | ICD-10-CM | POA: Clinically undetermined

## 2021-12-03 DIAGNOSIS — D631 Anemia in chronic kidney disease: Secondary | ICD-10-CM

## 2021-12-03 LAB — CBC
HCT: 31.6 % — ABNORMAL LOW (ref 39.0–52.0)
Hemoglobin: 10.1 g/dL — ABNORMAL LOW (ref 13.0–17.0)
MCH: 30.1 pg (ref 26.0–34.0)
MCHC: 32 g/dL (ref 30.0–36.0)
MCV: 94 fL (ref 80.0–100.0)
Platelets: 156 10*3/uL (ref 150–400)
RBC: 3.36 MIL/uL — ABNORMAL LOW (ref 4.22–5.81)
RDW: 19 % — ABNORMAL HIGH (ref 11.5–15.5)
WBC: 9.2 10*3/uL (ref 4.0–10.5)
nRBC: 0 % (ref 0.0–0.2)

## 2021-12-03 LAB — URINALYSIS, ROUTINE W REFLEX MICROSCOPIC
Glucose, UA: 100 mg/dL — AB
Ketones, ur: NEGATIVE mg/dL
Nitrite: POSITIVE — AB
Protein, ur: >300 mg/dL — AB
Specific Gravity, Urine: 1.015 (ref 1.005–1.030)
pH: 8 (ref 5.0–8.0)

## 2021-12-03 LAB — GLUCOSE, CAPILLARY
Glucose-Capillary: 137 mg/dL — ABNORMAL HIGH (ref 70–99)
Glucose-Capillary: 157 mg/dL — ABNORMAL HIGH (ref 70–99)
Glucose-Capillary: 63 mg/dL — ABNORMAL LOW (ref 70–99)
Glucose-Capillary: 75 mg/dL (ref 70–99)
Glucose-Capillary: 91 mg/dL (ref 70–99)

## 2021-12-03 LAB — BASIC METABOLIC PANEL
Anion gap: 19 — ABNORMAL HIGH (ref 5–15)
BUN: 50 mg/dL — ABNORMAL HIGH (ref 8–23)
CO2: 20 mmol/L — ABNORMAL LOW (ref 22–32)
Calcium: 8.3 mg/dL — ABNORMAL LOW (ref 8.9–10.3)
Chloride: 93 mmol/L — ABNORMAL LOW (ref 98–111)
Creatinine, Ser: 8.09 mg/dL — ABNORMAL HIGH (ref 0.61–1.24)
GFR, Estimated: 7 mL/min — ABNORMAL LOW (ref >60)
Glucose, Bld: 172 mg/dL — ABNORMAL HIGH (ref 70–99)
Potassium: 4.1 mmol/L (ref 3.5–5.1)
Sodium: 132 mmol/L — ABNORMAL LOW (ref 135–145)

## 2021-12-03 LAB — APTT
aPTT: 55 seconds — ABNORMAL HIGH (ref 24–36)
aPTT: 79 seconds — ABNORMAL HIGH (ref 24–36)

## 2021-12-03 LAB — HEPATITIS B SURFACE ANTIBODY,QUALITATIVE: Hep B S Ab: NONREACTIVE

## 2021-12-03 LAB — URINALYSIS, MICROSCOPIC (REFLEX)
RBC / HPF: >50 RBC/hpf (ref 0–5)
WBC, UA: >50 WBC/hpf (ref 0–5)

## 2021-12-03 LAB — HEPARIN LEVEL (UNFRACTIONATED): Heparin Unfractionated: 0.88 IU/mL — ABNORMAL HIGH (ref 0.30–0.70)

## 2021-12-03 LAB — HEPATITIS B SURFACE ANTIGEN: Hepatitis B Surface Ag: NONREACTIVE

## 2021-12-03 MED ORDER — VANCOMYCIN HCL IN DEXTROSE 1-5 GM/200ML-% IV SOLN
1000.0000 mg | Freq: Once | INTRAVENOUS | Status: DC
  Filled 2021-12-03: qty 200

## 2021-12-03 MED ORDER — CHLORHEXIDINE GLUCONATE CLOTH 2 % EX PADS
6.0000 | MEDICATED_PAD | Freq: Every day | CUTANEOUS | Status: DC
  Administered 2021-12-03 – 2021-12-08 (×5): 6 via TOPICAL

## 2021-12-03 NOTE — Progress Notes (Signed)
Pharmacy Antibiotic Note  Roger Vazquez is a 71 y.o. male admitted on 12/01/2021 with cellulitis.  Pharmacy has been consulted for vancomycin and cefepime dosing. Of note, patient receives HD on MWF. Per vascular note, patient is likely to have revision of stump 2/20. Patient received a load of Vancomycin 1g and 1 dose of ceftazadime at outpatient HD 2/17.   WBC WNL, afebrile.   Plan: - Vancomycin 500 mg IV Q HD session - F/U HD schedule  - Cefepime 1 g IV Q24H  - Monitor CBC, cultures and clinical progress - Vanc levels as indicated  Height: 5\' 6"  (167.6 cm) Weight: 50.8 kg (112 lb) IBW/kg (Calculated) : 63.8  Temp (24hrs), Avg:98.9 F (37.2 C), Min:97.9 F (36.6 C), Max:99.8 F (37.7 C)  Recent Labs  Lab 12/01/21 1815 12/02/21 0425 12/03/21 0332  WBC 11.0* 9.3 9.2  CREATININE 5.83* 6.63* 8.09*  LATICACIDVEN 1.6  --   --      Estimated Creatinine Clearance: 5.9 mL/min (A) (by C-G formula based on SCr of 8.09 mg/dL (H)).    Allergies  Allergen Reactions   Sildenafil Other (See Comments)    Pt stated, "Makes my Blood Pressure drop"    Antimicrobials this admission: Ceftriaxone 2/17 x1 Ceftazadime 2/17 x1 Vancomycin 2/17 >>  Cefepime 2/17 >>  Dose adjustments this admission: N/A    Microbiology results: 2/17 BCx:  2/17 UCx:    Thank you for allowing pharmacy to be a part of this patients care. Adria Dill, PharmD PGY-1 Acute Care Resident  12/03/2021 7:35 AM

## 2021-12-03 NOTE — Progress Notes (Signed)
ANTICOAGULATION CONSULT NOTE - Follow Up Consult  Pharmacy Consult for heparin Indication: DVT  Labs: Recent Labs    12/01/21 1815 12/02/21 0425 12/02/21 1849 12/03/21 0332  HGB 11.2* 10.4*  --  10.1*  HCT 34.4* 34.3*  --  31.6*  PLT 177 161  --  156  APTT 40*  --  52* 55*  LABPROT 16.3*  --   --   --   INR 1.3*  --   --   --   HEPARINUNFRC  --   --  >1.10* 0.88*  CREATININE 5.83* 6.63*  --  8.09*    Assessment: 71yo male subtherapeutic on heparin after rate change; no infusion issues or signs of bleeding per RN.  Goal of Therapy:  aPTT 66-102 seconds   Plan:  Will increase heparin infusion by 2 units/kg/hr to 1100 units/hr and check level in 8 hours.    Wynona Neat, PharmD, BCPS  12/03/2021,5:12 AM

## 2021-12-03 NOTE — Progress Notes (Signed)
°  Daily Progress Note   Subjective: Feels better than previous. More interactive and conversational.  Objective: Vitals:   12/03/21 0348 12/03/21 0953  BP: 132/68 128/72  Pulse: 77 78  Resp: 18 18  Temp: 99 F (37.2 C) 98.8 F (37.1 C)  SpO2: 100% 99%    Physical Examination Right-sided below-knee amputation stump with less erythema than previous, no drainage.  ASSESSMENT/PLAN:  I had a long conversation with Roger Vazquez regarding his right sided below-knee amputation.  There is dehiscence of the wound.  Due to his current overall clinical status, I think he would be best suited with revision to above-knee amputation.  This would have the highest probability of healing.  Vidur said that he would not consent to any above-knee amputation.  And that should he need anything done, he would only consent to revision below the knee.  He is aware this comes and a high risk of nonhealing, but is willing to take that risk.  After discussing the risks and benefits of right-sided below-knee amputation revision, Berton elected to proceed.  Please make n.p.o. midnight Morning case scheduled.    Cassandria Santee MD MS [facility] [phone] 12/03/2021  10:52 AM

## 2021-12-03 NOTE — Progress Notes (Signed)
[facility] PROGRESS NOTE   Roger Vazquez WKM:[id number] DOB: 04/18/1951 DOA: 12/01/2021  2 DOS: the patient was seen and examined on 12/03/2021  PCP: [facility]  Brief History and Hospital Course:  71 y.o. 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  He was transferred to hospital.  Evaluated by vascular surgery.  Was hospitalized for further management.  Consultants: Vascular surgery.  Nephrology.  Procedures: None yet    Subjective: Patient noted to be somewhat confused and distracted.  But answers other questions appropriately.  Admits to some urinary discomfort.  Denies any chest pain shortness of breath.     Assessment/Plan:  * Cellulitis of right lower extremity- (present on admission) He underwent right below-knee amputation in January.  Was noted to be febrile while at dialysis on 2/17.  Concern for dehiscence of wound.  Was sent to the ED.   Seen by vascular surgery.  Wound dehiscence was noted.  No purulence however was appreciated. Underwent CT scan of his right lower extremity which did not show any soft tissue gas.  Significant edema was noted. Patient admitted to the hospital.  He will likely need revision surgery this admission.  Started on vancomycin and cefepime which is to be continued.  Noted to be afebrile.  WBC is normal this morning.  Follow-up on blood cultures.  UTI (urinary tract infection) A UA was done overnight which is remarkably abnormal.  Discomfort with urination.  He is slightly tender to palpation in the suprapubic area.  He is already receiving antibiotics as discussed earlier.  Follow-up on urine  cultures.  We will do a bladder scan.  Acute metabolic encephalopathy- (present on admission) Noted to be encephalopathic at the time of admission.  Mentation appears to have improved though he still is somewhat distracted.  CT head was unremarkable.  No focal neurological deficits.  Continue to monitor.   ESRD on hemodialysis [facility]) Dialyzed on Monday Wednesday Friday schedule.  He underwent full dialysis on 2/17.  Next dialysis will be on Monday.  Nephrology is aware.  HTN (hypertension)- (present on admission) Blood pressure reasonably well controlled.  Noted to be on amlodipine.    DVT (deep venous thrombosis) ([place])- (present on admission) Patient on Eliquis prior to admission which is currently on hold due to need for surgery in the near future.  He was diagnosed with acute DVT in October 2022.  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 Currently on IV heparin.  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  Monitor CBGs.  On SSI.  Not noted to be on any glucose lowering agents at home.  Anemia in chronic kidney disease- (present on admission) Hemoglobin noted to be low but stable.  Continue to monitor.     DVT Prophylaxis: Eliquis on hold.  On IV heparin Code Status: Full code Family Communication: Discussed with patient.  No family at bedside. Disposition Plan: Hopefully return home when improved  Status is: Inpatient Remains inpatient appropriate because: Cellulitis right lower extremity, wound dehiscence      Medications: Scheduled:  acetaminophen  650 mg Oral  Q6H   amLODipine  10 mg Oral Daily   calcium acetate  667 mg Oral TID WC   Chlorhexidine Gluconate Cloth  6 each Topical Q0600   insulin aspart  0-6 Units Subcutaneous TID WC   pantoprazole  40 mg Oral BID   Continuous:  ceFEPime (MAXIPIME) IV 1 g (12/02/21 2337)   heparin 1,100 Units/hr (12/03/21 0532)   [START  ON 12/04/2021] vancomycin     OIZ:[id number]  Antibiotics: Anti-infectives (From admission, onward)    Start     Dose/Rate Route Frequency Ordered Stop   12/04/21 1200  vancomycin (VANCOREADY) IVPB 500 mg/100 mL  Status:  Discontinued        500 mg 100 mL/hr over 60 Minutes Intravenous Every M-W-F (Hemodialysis) 12/01/21 1803 12/01/21 2039   12/04/21 1200  vancomycin (VANCOREADY) IVPB 500 mg/100 mL        500 mg 100 mL/hr over 60 Minutes Intravenous Every M-W-F (Hemodialysis) 12/01/21 2039 12/08/21 1159   12/03/21 0815  vancomycin (VANCOCIN) IVPB 1000 mg/200 mL premix  Status:  Discontinued        1,000 mg 200 mL/hr over 60 Minutes Intravenous  Once 12/03/21 0726 12/03/21 0733   12/01/21 2200  ceFEPIme (MAXIPIME) 1 g in sodium chloride 0.9 % 100 mL IVPB        1 g 200 mL/hr over 30 Minutes Intravenous Every 24 hours 12/01/21 2037 12/08/21 2159   12/01/21 2030  ceFEPIme (MAXIPIME) 2 g in sodium chloride 0.9 % 100 mL IVPB  Status:  Discontinued        2 g 200 mL/hr over 30 Minutes Intravenous  Once 12/01/21 2026 12/01/21 2037   12/01/21 1715  vancomycin (VANCOCIN) IVPB 1000 mg/200 mL premix  Status:  Discontinued        1,000 mg 200 mL/hr over 60 Minutes Intravenous  Once 12/01/21 1702 12/01/21 1921   12/01/21 1715  cefTRIAXone (ROCEPHIN) 2 g in sodium chloride 0.9 % 100 mL IVPB        2 g 200 mL/hr over 30 Minutes Intravenous  Once 12/01/21 1702 12/01/21 1925       Objective:  Vital Signs  Vitals:   12/02/21 1726 12/02/21 1928 12/03/21 0348 12/03/21 0953  BP: (!) 149/75 (!) 147/70 132/68 128/72  Pulse: 86 85 77 78  Resp: 16 18 18 18   Temp: 98.7 F (37.1 C) 99.8 F (37.7 C) 99 F (37.2 C) 98.8 F (37.1 C)  TempSrc: Oral Oral Oral Oral  SpO2: 98% 99% 100% 99%  Weight:      Height:        Intake/Output Summary (Last 24 hours) at 12/03/2021 1046 Last data filed at 12/03/2021 0900 Gross per 24 hour  Intake 1094.73 ml  Output 200 ml  Net 894.73 ml     Filed Weights   12/01/21 1700  Weight: 50.8 kg    General appearance: Awake alert.  In no distress.  Distracted Resp: Clear to auscultation bilaterally.  Normal effort Cardio: S1-S2 is normal regular.  No S3-S4.  No rubs murmurs or bruit GI: Abdomen is soft.  Mildly tender in the suprapubic area without any rebound rigidity or guarding.  No masses organomegaly. Extremities: Status post bilateral BKA.  Right stump covered in dressing. Neurologic:  No focal neurological deficits.     Lab Results:  Data Reviewed: I have personally reviewed labs and imaging study reports  CBC: Recent Labs  Lab 12/01/21 1815 12/02/21 0425 12/03/21 0332  WBC 11.0* 9.3 9.2  NEUTROABS  9.0*  --   --   HGB 11.2* 10.4* 10.1*  HCT 34.4* 34.3* 31.6*  MCV 94.0 96.3 94.0  PLT 177 161 156     Basic Metabolic Panel: Recent Labs  Lab 12/01/21 1815 12/02/21 0425 12/03/21 0332  NA 136 138 132*  K 3.0* 3.3* 4.1  CL 94* 97* 93*  CO2 26 24 20*  GLUCOSE 147* 105* 172*  BUN 26* 34* 50*  CREATININE 5.83* 6.63* 8.09*  CALCIUM 8.3* 8.3* 8.3*     GFR: Estimated Creatinine Clearance: 5.9 mL/min (A) (by C-G formula based on SCr of 8.09 mg/dL (H)).  Liver Function Tests: Recent Labs  Lab 12/01/21 1815 12/02/21 0425  AST 22 19  ALT 12 9  ALKPHOS 89 76  BILITOT 0.7 0.3  PROT 8.6* 7.4  ALBUMIN 3.2* 2.7*      Recent Labs  Lab 12/01/21 1815  AMMONIA 17     Coagulation Profile: Recent Labs  Lab 12/01/21 1815  INR 1.3*       CBG: Recent Labs  Lab 12/02/21 0818 12/02/21 1235 12/02/21 1648 12/02/21 2033 12/03/21 0719  GLUCAP 102* 171* 148* 146* 137*      Recent Results (from the past 240 hour(s))  Blood Culture (routine x 2)     Status: None (Preliminary result)   Collection Time: 12/01/21  6:15 PM   Specimen: BLOOD  Result Value Ref Range Status   Specimen Description BLOOD SITE NOT SPECIFIED  Final   Special Requests   Final    BOTTLES DRAWN AEROBIC AND  ANAEROBIC Blood Culture adequate volume   Culture   Final    NO GROWTH 2 DAYS Performed at [facility], [place] [address] [place], [place] [address]    Report Status PENDING  Incomplete  Resp Panel by RT-PCR (Flu A&B, Covid) Nasopharyngeal Swab     Status: None   Collection Time: 12/02/21  3:30 AM   Specimen: Nasopharyngeal Swab; Nasopharyngeal(NP) swabs in vial transport medium  Result Value Ref Range Status   SARS Coronavirus 2 by RT PCR NEGATIVE NEGATIVE Final    Comment: (NOTE) SARS-CoV-2 target nucleic acids are NOT DETECTED.  The SARS-CoV-2 RNA is generally detectable in upper respiratory specimens during the acute phase of infection. The lowest concentration of SARS-CoV-2 viral copies this assay can detect is 138 copies/mL. A negative result does not preclude SARS-Cov-2 infection and should not be used as the sole basis for treatment or other patient management decisions. A negative result may occur with  improper specimen collection/handling, submission of specimen other than nasopharyngeal swab, presence of viral mutation(s) within the areas targeted by this assay, and inadequate number of viral copies(<138 copies/mL). A negative result must be combined with clinical observations, patient history, and epidemiological information. The expected result is Negative.  Fact Sheet for Patients:  EntrepreneurPulse.com.au  Fact Sheet for Healthcare Providers:  IncredibleEmployment.be  This test is no t yet approved or cleared by the [place] FDA and  has been authorized for detection and/or diagnosis of SARS-CoV-2 by FDA under an Emergency Use Authorization (EUA). This EUA will remain  in effect (meaning this test can be used) for the duration of the COVID-19 declaration under Section 564(b)(1) of the Act, 21 U.S.C.section 360bbb-3(b)(1), unless the authorization is terminated  or revoked sooner.       Influenza A by PCR NEGATIVE  NEGATIVE Final   Influenza B by PCR NEGATIVE NEGATIVE Final    Comment: (NOTE) The Xpert Xpress SARS-CoV-2/FLU/RSV plus assay  is intended as an aid in the diagnosis of influenza from Nasopharyngeal swab specimens and should not be used as a sole basis for treatment. Nasal washings and aspirates are unacceptable for Xpert Xpress SARS-CoV-2/FLU/RSV testing.  Fact Sheet for Patients: EntrepreneurPulse.com.au  Fact Sheet for Healthcare Providers: IncredibleEmployment.be  This test is not yet approved or cleared by the [place] FDA and has been authorized for detection and/or diagnosis of SARS-CoV-2 by FDA under an Emergency Use Authorization (EUA). This EUA will remain in effect (meaning this test can be used) for the duration of the COVID-19 declaration under Section 564(b)(1) of the Act, 21 U.S.C. section 360bbb-3(b)(1), unless the authorization is terminated or revoked.  Performed at [facility], [place] [address] [place], [place] [address]   Blood Culture (routine x 2)     Status: None (Preliminary result)   Collection Time: 12/02/21  4:25 AM   Specimen: BLOOD LEFT HAND  Result Value Ref Range Status   Specimen Description BLOOD LEFT HAND  Final   Special Requests AEROBIC BOTTLE ONLY Blood Culture adequate volume  Final   Culture   Final    NO GROWTH 1 DAY Performed at [facility], [place] [address], [place], [place] [address]    Report Status PENDING  Incomplete       Radiology Studies: DG Tibia/Fibula Right  Result Date: 12/01/2021 CLINICAL DATA:  Postop infection. EXAM: RIGHT TIBIA AND FIBULA - 2 VIEW COMPARISON:  None. FINDINGS: Below the knee amputation. Intramedullary rod with proximal locking screws in the proximal tibia. Tibia and fibular resection margins are smooth. No erosions, periosteal reaction, or bone destruction. Mild skin and soft tissue irregularity about the anterior aspect of the distal stump. No  tracking soft tissue air. No radiopaque foreign body. The bones are under mineralized. Vascular calcifications are seen. IMPRESSION: 1. Below the knee amputation with intramedullary rod in the proximal tibia. No radiographic findings of osteomyelitis. 2. Mild skin and soft tissue irregularity about the anterior aspect of the distal stump may represent postsurgical change or infection. Electronically Signed   By: Keith Rake M.D.   On: 12/01/2021 17:24   CT Head Wo Contrast  Result Date: 12/01/2021 CLINICAL DATA:  Mental status changes. EXAM: CT HEAD WITHOUT CONTRAST TECHNIQUE: Contiguous axial images were obtained from the base of the skull through the vertex without intravenous contrast. RADIATION DOSE REDUCTION: This exam was performed according to the departmental dose-optimization program which includes automated exposure control, adjustment of the mA and/or kV according to patient size and/or use of iterative reconstruction technique. COMPARISON:  08/11/2021 FINDINGS: Brain: There is no evidence for acute hemorrhage, hydrocephalus, mass lesion, or abnormal extra-axial fluid collection. No definite CT evidence for acute infarction. Diffuse loss of parenchymal volume is consistent with atrophy. Patchy low attenuation in the deep hemispheric and periventricular white matter is nonspecific, but likely reflects chronic microvascular ischemic demyelination. 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 Vascular: No hyperdense vessel or unexpected calcification. Skull: Normal. Negative for fracture or focal lesion. Sinuses/Orbits: The visualized paranasal sinuses and mastoid air cells are clear. Visualized portions of the globes and intraorbital fat are unremarkable. Other: None IMPRESSION: 1. No acute intracranial abnormality. 2. Atrophy with chronic small vessel ischemic disease. Electronically Signed   By: Misty Stanley M.D.   On: 12/01/2021 19:05   CT TIBIA FIBULA RIGHT W CONTRAST  Result Date:  12/02/2021 CLINICAL DATA:  Status post below the knee amputation. Signs of soft tissue infection. EXAM: CT OF  THE LOWER RIGHT EXTREMITY WITH CONTRAST TECHNIQUE: Multidetector CT imaging of the lower right extremity was performed according to the standard protocol following intravenous contrast administration. RADIATION DOSE REDUCTION: This exam was performed according to the departmental dose-optimization program which includes automated exposure control, adjustment of the mA and/or kV according to patient size and/or use of iterative reconstruction technique. CONTRAST:  138mL OMNIPAQUE IOHEXOL 350 MG/ML SOLN COMPARISON:  None. FINDINGS: Bones/Joint/Cartilage The bones appear osteopenic. The patient is status post below the knee amputation. IM rod and screws are again noted within the remaining portions of the proximal tibia. The tibial resection margin remains distinct without bone erosion or bony fragmentation. Along the fibular resection margin the bone appears slightly irregular and there are areas of cortical fragmentation, image 92/7 and image 108/4. No definite bone erosion or signs of periosteal reaction. Ligaments Suboptimally assessed by CT. Muscles and Tendons There is diffuse low-attenuation edema with enhancement along the fascial planes throughout the muscles and soft tissues overlying the tibial and fibular resection margins. Here, there is a focal area of possible phlegmon formation without distinct fluid collection measuring 7.5 by 3.6 cm, image 125/4 and image 90/8, suspicious for underlying myositis. There is no gas identified within this area. No distinct drainable fluid collection noted. Soft tissues Skin thickening and subcutaneous soft tissue stranding within the distal aspect of the stump is concerning for cellulitis. IMPRESSION: 1. Status post below the knee amputation. 2. Skin thickening and subcutaneous soft tissue stranding within the distal aspect of the stump is identified. There is  large focal area of indistinct, low-attenuation edema, with enhancement along the fascial planes, throughout the muscles overlying the tibial and fibular resection margins. Underlying phlegmon formation cannot be excluded. No soft tissue gas identified. Imaging are concerning for lower extremity cellulitis findings with underlying myositis. 3. Bony fragmentation along the fibular resection margin is identified. This may reflect postsurgical change but bony destruction due to osteomyelitis cannot be excluded with a high degree of certainty. Electronically Signed   By: Kerby Moors M.D.   On: 12/02/2021 05:57   DG Chest Port 1 View  Result Date: 12/01/2021 CLINICAL DATA:  Postoperative infection EXAM: PORTABLE CHEST 1 VIEW COMPARISON:  08/13/2021 FINDINGS: Central line tip in the upper right atrium. Heart size is normal. Chronic aortic atherosclerosis. Previous lobectomy on the left. No sign of pulmonary infiltrate. No visible effusion. Old healed rib fracture on the left. Left axillary region vascular stent. IMPRESSION: No active disease. Electronically Signed   By: Nelson Chimes M.D.   On: 12/01/2021 17:26       LOS: 2 days   [facility] [facility] Pager on [email or web address]  12/03/2021, 10:46 AM

## 2021-12-03 NOTE — Progress Notes (Signed)
ANTICOAGULATION CONSULT NOTE  Pharmacy Consult for heparin Indication: DVT  Allergies  Allergen Reactions   Sildenafil Other (See Comments)    Pt stated, "Makes my Blood Pressure drop"    Patient Measurements: Height: 5\' 6"  (167.6 cm) Weight: 50.8 kg (112 lb) IBW/kg (Calculated) : 63.8 Heparin Dosing Weight: 50.8 kg   Vital Signs: Temp: 98.8 F (37.1 C) (02/19 0953) Temp Source: Oral (02/19 0953) BP: 128/72 (02/19 0953) Pulse Rate: 78 (02/19 0953)  Labs: Recent Labs    12/01/21 1815 12/02/21 0425 12/02/21 1849 12/03/21 0332 12/03/21 1502  HGB 11.2* 10.4*  --  10.1*  --   HCT 34.4* 34.3*  --  31.6*  --   PLT 177 161  --  156  --   APTT 40*  --  52* 55* 79*  LABPROT 16.3*  --   --   --   --   INR 1.3*  --   --   --   --   HEPARINUNFRC  --   --  >1.10* 0.88*  --   CREATININE 5.83* 6.63*  --  8.09*  --      Estimated Creatinine Clearance: 5.9 mL/min (A) (by C-G formula based on SCr of 8.09 mg/dL (H)).   Medical History: Past Medical History:  Diagnosis Date   Anemia    Blood dyscrasia    history of coagulation deficit per [facility] nursing home   Cancer [facility])    Lung   Diabetes mellitus    Hepatitis    Hypertension    Renal disorder    MWF- [facility]    Medications:  Medications Prior to Admission  Medication Sig Dispense Refill Last Dose   acetaminophen (TYLENOL) 325 MG tablet Take 650 mg by mouth every 6 (six) hours.   12/01/2021   amLODipine (NORVASC) 10 MG tablet Take 1 tablet (10 mg total) by mouth daily.   12/01/2021   apixaban (ELIQUIS) 5 MG TABS tablet Take 1 tablet (5 mg total) by mouth 2 (two) times daily. (Patient taking differently: Take 2.5 mg by mouth 2 (two) times daily.) 60 tablet  12/01/2021 at 0900   aspirin 81 MG chewable tablet Chew 81 mg by mouth daily. (0800)   12/01/2021   calcium acetate (PHOSLO) 667 MG capsule Take 667 mg by mouth 3 (three) times daily with meals. (0800, 1300 & 1700)   12/01/2021   ferrous sulfate 325  (65 FE) MG tablet Take 325 mg by mouth daily with breakfast.   12/01/2021   hydrocortisone (ANUSOL-HC) 25 MG suppository Place 25 mg rectally 2 (two) times daily as needed for hemorrhoids or anal itching.   UNK   lidocaine-prilocaine (EMLA) cream Apply 1 application topically See admin instructions. As needed on dialysis days.(Monday,Wednesday and Friday)   UNK   Multiple Vitamin (MULITIVITAMIN WITH MINERALS) TABS Take 1 tablet by mouth daily. 30 tablet 0 12/01/2021   Nutritional Supplements (NOVASOURCE RENAL PO) Take 237 mLs by mouth daily.   12/01/2021   oxybutynin (DITROPAN) 5 MG tablet Take 2.5 mg by mouth 2 (two) times daily.   12/01/2021   oxyCODONE (OXY IR/ROXICODONE) 5 MG immediate release tablet Take 1 tablet (5 mg total) by mouth 3 (three) times daily as needed for up to 4 doses for severe pain. 4 tablet 0 11/30/2021   pantoprazole (PROTONIX) 40 MG tablet Take 1 tablet (40 mg total) by mouth 2 (two) times daily.   12/01/2021   polyethylene glycol (MIRALAX / GLYCOLAX) 17 g packet Take  17 g by mouth 2 (two) times daily. 14 each 0 12/01/2021   senna (SENOKOT) 8.6 MG TABS tablet Take 1 tablet (8.6 mg total) by mouth daily. 7 tablet 0 12/01/2021    Assessment: 81 YOM with h/o of ESRD on HD and lower extremity DVT on Eliquis. Pharmacy consulted to dose IV heparin -aPTT= 79 on 1100 units/hr   Goal of Therapy:  Heparin level 0.3-0.7 units/ml aPTT 66-102s seconds Monitor platelets by anticoagulation protocol: Yes   Plan:  -Continue heparin to 1000 units/hr -Heparin level daily wth CBC daily  Hildred Laser, PharmD Clinical Pharmacist **Pharmacist phone directory can now be found on [email or web address] ([email or web address]).  Listed under [place].

## 2021-12-03 NOTE — Assessment & Plan Note (Addendum)
Apparently patient was complaining of discomfort with urination.  A UA was done which was remarkably abnormal.  Urine culture shows multiple species. Patient is already on antibiotics as discussed earlier.

## 2021-12-03 NOTE — Consult Note (Signed)
[facility] Renal Consultation Note    Indication for Consultation:  Management of ESRD/hemodialysis, anemia, hypertension/volume, and secondary hyperparathyroidism.  HPI: Roger Vazquez is a 71 y.o. male with PMH including ESRD on dialysis MWF, diabetes mellitus, and HTN who was sent to the ED from his dialysis center on Friday for stump infection. He was found to have a fever of 103F and his right lower extremity BKA stump appeared to have wound dehiscence. In the ER, x-rays did not show osteomyelitis but showed soft tissue infection. He received vancomycin 1g and ceftazidime 2g at HD prior to transfer to the ED. In the ED, vascular surgery recommenced revision, likely on Monday. Pt was also started on IV antibiotics. Nephrology has been consulted for management of ESRD. Labs notable for Na 132, K+ 4.1, BUN 50, Cr 8.09, Hgb 10.1 and WBC 9.2. VSS.   Patient was examined in his room this AM. He was alert and eating breakfast. Denies any pain at present. Also denies SOB, CP, palpitations, dizziness, abdominal pain, nausea, vomiting, diarrhea and chills. Of note, he has a [facility] for dialysis access but vascular surgery evaluated his right forearm AVG on 11/21/21 and determined prior thrombectomy was likely successful and recommended trying the AVG again.   Past Medical History:  Diagnosis Date   Anemia    Blood dyscrasia    history of coagulation deficit per [facility] L[facility] nursing home   Cancer [facility])    Lung   Diabetes mellitus    Hepatitis    Hypertension    Renal disorder    MWF- [facility]   Past Surgical History:  Procedure Laterality Date   ABDOMINAL AORTOGRAM W/LOWER EXTREMITY Left 04/28/2021   Procedure: ABDOMINAL AORTOGRAM W/LOWER EXTREMITY;  Surgeon: Cherre Robins, MD;  Location: [place] [facility];  Service: Cardiovascular;  Laterality: Left;   AMPUTATION Left 05/04/2021   Procedure: LEFT SECOND TOE AMPUTATION;  Surgeon: Cherre Robins, MD;  Location: [place];  Service: Vascular;  Laterality: Left;   AMPUTATION Left 06/08/2021   Procedure: LEFT BELOW KNEE AMPUTATION;  Surgeon: Cherre Robins, MD;  Location: [place];  Service: Vascular;  Laterality: Left;   AMPUTATION Right 10/20/2021   Procedure: RIGHT BELOW KNEE AMPUTATION;  Surgeon: Cherre Robins, MD;  Location: [place];  Service: Vascular;  Laterality: Right;   APPLICATION OF WOUND VAC Left 06/08/2021   Procedure: APPLICATION OF WOUND VAC;  Surgeon: Cherre Robins, MD;  Location: [place];  Service: Vascular;  Laterality: Left;   APPLICATION OF WOUND VAC Right 10/20/2021   Procedure: APPLICATION OF WOUND VAC;  Surgeon: Cherre Robins, MD;  Location: [place];  Service: Vascular;  Laterality: Right;   artiovenous graft placement Left    AV FISTULA PLACEMENT Right 11/27/2018   Procedure: INSERTION 4-7MM X 45CM GORETEX GRAFT RIGHT FOREARM ;  Surgeon: Rosetta Posner, MD;  Location: [place];  Service: Vascular;  Laterality: Right;   [place] REMOVAL Left 08/06/2018   Procedure: REMOVAL OF LEFT ARM GRAFT;  Surgeon: Rosetta Posner, MD;  Location: [place];  Service: Vascular;  Laterality: Left;   BIOPSY  10/17/2021   Procedure: BIOPSY;  Surgeon: Irene Shipper, MD;  Location: [place];  Service: Endoscopy;;   COLONOSCOPY WITH PROPOFOL N/A 10/17/2021   Procedure: COLONOSCOPY WITH PROPOFOL;  Surgeon: Irene Shipper, MD;  Location: [place];  Service: Endoscopy;  Laterality: N/A;   ESOPHAGOGASTRODUODENOSCOPY (EGD) WITH PROPOFOL N/A 10/17/2021   Procedure: ESOPHAGOGASTRODUODENOSCOPY (EGD) WITH  PROPOFOL;  Surgeon: Irene Shipper, MD;  Location: [facility] ENDOSCOPY;  Service: Endoscopy;  Laterality: N/A;   INSERTION OF DIALYSIS CATHETER Right 08/08/2018   Procedure: INSERTION OF TUNNELED  DIALYSIS CATHETER;  Surgeon: Marty Heck, MD;  Location: [place];  Service: Vascular;  Laterality: Right;   IR DIALY SHUNT INTRO NEEDLE/INTRACATH INITIAL W/IMG RIGHT Right 05/26/2021   IR REMOVAL TUN CV CATH W/O FL  02/19/2019   IR US  GUIDE VASC ACCESS RIGHT  05/26/2021   LUNG SURGERY     patient not if they removed part of lung. Was done at [place] Left 04/28/2021   Procedure: PERIPHERAL VASCULAR BALLOON ANGIOPLASTY;  Surgeon: Cherre Robins, MD;  Location: [place] [facility];  Service: Cardiovascular;  Laterality: Left;  popliteal and posterial tibial   TEE WITHOUT CARDIOVERSION N/A 07/31/2018   Procedure: TRANSESOPHAGEAL ECHOCARDIOGRAM (TEE);  Surgeon: Pixie Casino, MD;  Location: [facility] ENDOSCOPY;  Service: Cardiovascular;  Laterality: N/A;   TRANSMETATARSAL AMPUTATION Left 05/22/2021   Procedure: LEFT TRANSMETATARSAL AMPUTATION;  Surgeon: Elam Dutch, MD;  Location: [place];  Service: Vascular;  Laterality: Left;   History reviewed. No pertinent family history. Social History:  reports that he quit smoking about 22 years ago. His smoking use included cigarettes. He has a 5.00 pack-year smoking history. He quit smokeless tobacco use about 6 years ago. He reports that he does not currently use alcohol after a past usage of about 2.0 standard drinks per week. He reports that he does not currently use drugs after having used the following drugs: Marijuana.  ROS: As per HPI otherwise negative.  Physical Exam: Vitals:   12/02/21 1641 12/02/21 1726 12/02/21 1928 12/03/21 0348  BP: (!) 159/79 (!) 149/75 (!) 147/70 132/68  Pulse: 84 86 85 77  Resp: 16 16 18 18   Temp: 97.9 F (36.6 C) 98.7 F (37.1 C) 99.8 F (37.7 C) 99 F (37.2 C)  TempSrc: Oral Oral Oral Oral  SpO2: 96% 98% 99% 100%  Weight:      Height:         General: Well developed, elderly male, in no acute distress. Head: Normocephalic, atraumatic, sclera non-icteric, mucus membranes are moist. Neck:  JVD not elevated. Lungs: Clear bilaterally to auscultation without wheezes, rales, or rhonchi. Breathing is unlabored. Heart: RRR with normal S1, S2. No murmurs, rubs, or gallops appreciated. Abdomen: Soft,  non-tender, non-distended with normoactive bowel sounds.  No obvious abdominal masses. Lower extremities: B/l BKA, right stump with dressing. No pitting edema Neuro: Alert. Moves all extremities spontaneously. Psych:  Responds to questions appropriately with a normal affect. Dialysis Access: L IJ TDC, right forearm graft with + bruit  Allergies  Allergen Reactions   Sildenafil Other (See Comments)    Pt stated, "Makes my Blood Pressure drop"   Prior to Admission medications   Medication Sig Start Date End Date Taking? Authorizing Provider  acetaminophen (TYLENOL) 325 MG tablet Take 650 mg by mouth every 6 (six) hours.    [provider]  amLODipine (NORVASC) 10 MG tablet Take 1 tablet (10 mg total) by mouth daily. 08/16/21   Ghimire, Henreitta Leber, MD  apixaban (ELIQUIS) 5 MG TABS tablet Take 1 tablet (5 mg total) by mouth 2 (two) times daily. 08/19/21   Ghimire, Henreitta Leber, MD  aspirin 81 MG chewable tablet Chew 81 mg by mouth daily. (0800)    [provider]  calcium acetate (PHOSLO) 667 MG capsule Take 667 mg  by mouth 3 (three) times daily with meals. (0800, 1300 & 1700)    [provider]  ferrous sulfate 325 (65 FE) MG tablet Take 325 mg by mouth daily with breakfast.    [provider]  hydrocortisone (ANUSOL-HC) 25 MG suppository Place 25 mg rectally 2 (two) times daily as needed for hemorrhoids or anal itching.    [provider]  lidocaine-prilocaine (EMLA) cream Apply 1 application topically See admin instructions. As needed on dialysis days.Laurine Blazer and Friday) 04/28/21   [provider]  Multiple Vitamin (MULITIVITAMIN WITH MINERALS) TABS Take 1 tablet by mouth daily. 10/12/11   Robbie Lis, MD  Nutritional Supplements (NOVASOURCE RENAL PO) Take 237 mLs by mouth daily.    [provider]  oxyCODONE (OXY IR/ROXICODONE) 5 MG immediate release tablet Take 1 tablet (5 mg total) by mouth 3 (three) times daily as needed  for up to 4 doses for severe pain. 10/24/21   Antonieta Pert, MD  pantoprazole (PROTONIX) 40 MG tablet Take 1 tablet (40 mg total) by mouth 2 (two) times daily. 10/24/21   Antonieta Pert, MD  polyethylene glycol (MIRALAX / GLYCOLAX) 17 g packet Take 17 g by mouth 2 (two) times daily. 05/05/21   Ezequiel Essex, MD  senna (SENOKOT) 8.6 MG TABS tablet Take 1 tablet (8.6 mg total) by mouth daily. 05/05/21   Ezequiel Essex, MD   Current Facility-Administered Medications  Medication Dose Route Frequency Provider Last Rate Last Admin   acetaminophen (TYLENOL) tablet 650 mg  650 mg Oral Q6H [name], MD   650 mg at 12/02/21 1642   amLODipine (NORVASC) tablet 10 mg  10 mg Oral Daily [name], MD   10 mg at 12/02/21 0918   calcium acetate (PHOSLO) capsule 667 mg  667 mg Oral TID WC [name], MD   667 mg at 12/02/21 1642   ceFEPIme (MAXIPIME) 1 g in sodium chloride 0.9 % 100 mL IVPB  1 g Intravenous Q24H Lavenia Atlas, RPH 200 mL/hr at 12/02/21 2337 1 g at 12/02/21 2337   Chlorhexidine Gluconate Cloth 2 % PADS 6 each  6 each Topical Daily Bonnielee Haff, MD   6 each at 12/02/21 1733   heparin ADULT infusion 100 units/mL (25000 units/241mL)  1,100 Units/hr Intravenous Continuous Laren Everts, RPH 11 mL/hr at 12/03/21 0532 1,100 Units/hr at 12/03/21 0532   insulin aspart (novoLOG) injection 0-6 Units  0-6 Units Subcutaneous TID WC [name], MD   1 Units at 12/02/21 1248   oxyCODONE-acetaminophen (PERCOCET/ROXICET) 5-325 MG per tablet 1-2 tablet  1-2 tablet Oral Q6H PRN Etta Quill, DO   2 tablet at 12/03/21 0406   pantoprazole (PROTONIX) EC tablet 40 mg  40 mg Oral BID [name], MD   40 mg at 12/02/21 2212   [START ON 12/04/2021] vancomycin (VANCOREADY) IVPB 500 mg/100 mL  500 mg Intravenous Q M,W,F-HD [name], MD       Labs: Basic Metabolic Panel: Recent Labs  Lab 12/01/21 1815 12/02/21 0425 12/03/21 0332  NA 136 138 132*  K  3.0* 3.3* 4.1  CL 94* 97* 93*  CO2 26 24 20*  GLUCOSE 147* 105* 172*  BUN 26* 34* 50*  CREATININE 5.83* 6.63* 8.09*  CALCIUM 8.3* 8.3* 8.3*   Liver Function Tests: Recent Labs  Lab 12/01/21 1815 12/02/21 0425  AST 22 19  ALT 12 9  ALKPHOS 89 76  BILITOT 0.7 0.3  PROT 8.6* 7.4  ALBUMIN 3.2* 2.7*   No results for input(s): LIPASE, AMYLASE in the last 168 hours. Recent Labs  Lab 12/01/21 1815  AMMONIA 17   CBC: Recent Labs  Lab 12/01/21 1815 12/02/21 0425 12/03/21 0332  WBC 11.0* 9.3 9.2  NEUTROABS 9.0*  --   --   HGB 11.2* 10.4* 10.1*  HCT 34.4* 34.3* 31.6*  MCV 94.0 96.3 94.0  PLT 177 161 156   Cardiac Enzymes: No results for input(s): CKTOTAL, CKMB, CKMBINDEX, TROPONINI in the last 168 hours. CBG: Recent Labs  Lab 12/02/21 0818 12/02/21 1235 12/02/21 1648 12/02/21 2033 12/03/21 0719  GLUCAP 102* 171* 148* 146* 137*   Iron Studies: No results for input(s): IRON, TIBC, TRANSFERRIN, FERRITIN in the last 72 hours. Studies/Results: DG Tibia/Fibula Right  Result Date: 12/01/2021 CLINICAL DATA:  Postop infection. EXAM: RIGHT TIBIA AND FIBULA - 2 VIEW COMPARISON:  None. FINDINGS: Below the knee amputation. Intramedullary rod with proximal locking screws in the proximal tibia. Tibia and fibular resection margins are smooth. No erosions, periosteal reaction, or bone destruction. Mild skin and soft tissue irregularity about the anterior aspect of the distal stump. No tracking soft tissue air. No radiopaque foreign body. The bones are under mineralized. Vascular calcifications are seen. IMPRESSION: 1. Below the knee amputation with intramedullary rod in the proximal tibia. No radiographic findings of osteomyelitis. 2. Mild skin and soft tissue irregularity about the anterior aspect of the distal stump may represent postsurgical change or infection. Electronically Signed   By: Keith Rake M.D.   On: 12/01/2021 17:24   CT Head Wo Contrast  Result Date:  12/01/2021 CLINICAL DATA:  Mental status changes. EXAM: CT HEAD WITHOUT CONTRAST TECHNIQUE: Contiguous axial images were obtained from the base of the skull through the vertex without intravenous contrast. RADIATION DOSE REDUCTION: This exam was performed according to the departmental dose-optimization program which includes automated exposure control, adjustment of the mA and/or kV according to patient size and/or use of iterative reconstruction technique. COMPARISON:  08/11/2021 FINDINGS: Brain: There is no evidence for acute hemorrhage, hydrocephalus, mass lesion, or abnormal extra-axial fluid collection. No definite CT evidence for acute infarction. Diffuse loss of parenchymal volume is consistent with atrophy. Patchy low attenuation in the deep hemispheric and periventricular white matter is nonspecific, but likely reflects chronic microvascular ischemic demyelination. 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 Vascular: No hyperdense vessel or unexpected calcification. Skull: Normal. Negative for fracture or focal lesion. Sinuses/Orbits: The visualized paranasal sinuses and mastoid air cells are clear. Visualized portions of the globes and intraorbital fat are unremarkable. Other: None IMPRESSION: 1. No acute intracranial abnormality. 2. Atrophy with chronic small vessel ischemic disease. Electronically Signed   By: Misty Stanley M.D.   On: 12/01/2021 19:05   CT TIBIA FIBULA RIGHT W CONTRAST  Result Date: 12/02/2021 CLINICAL DATA:  Status post below the knee amputation. Signs of soft tissue infection. EXAM: CT OF THE LOWER RIGHT EXTREMITY WITH CONTRAST TECHNIQUE: Multidetector CT imaging of the lower right extremity was performed according to the standard protocol following intravenous contrast administration. RADIATION DOSE REDUCTION: This exam was performed according to the departmental dose-optimization program which includes automated exposure control, adjustment of the mA and/or kV according  to patient size and/or use of iterative reconstruction technique. CONTRAST:  184mL OMNIPAQUE IOHEXOL 350 MG/ML SOLN COMPARISON:  None. FINDINGS: Bones/Joint/Cartilage The bones appear osteopenic. The patient is status post below the knee amputation. IM rod and screws are again noted within the remaining portions of the proximal tibia.  The tibial resection margin remains distinct without bone erosion or bony fragmentation. Along the fibular resection margin the bone appears slightly irregular and there are areas of cortical fragmentation, image 92/7 and image 108/4. No definite bone erosion or signs of periosteal reaction. Ligaments Suboptimally assessed by CT. Muscles and Tendons There is diffuse low-attenuation edema with enhancement along the fascial planes throughout the muscles and soft tissues overlying the tibial and fibular resection margins. Here, there is a focal area of possible phlegmon formation without distinct fluid collection measuring 7.5 by 3.6 cm, image 125/4 and image 90/8, suspicious for underlying myositis. There is no gas identified within this area. No distinct drainable fluid collection noted. Soft tissues Skin thickening and subcutaneous soft tissue stranding within the distal aspect of the stump is concerning for cellulitis. IMPRESSION: 1. Status post below the knee amputation. 2. Skin thickening and subcutaneous soft tissue stranding within the distal aspect of the stump is identified. There is large focal area of indistinct, low-attenuation edema, with enhancement along the fascial planes, throughout the muscles overlying the tibial and fibular resection margins. Underlying phlegmon formation cannot be excluded. No soft tissue gas identified. Imaging are concerning for lower extremity cellulitis findings with underlying myositis. 3. Bony fragmentation along the fibular resection margin is identified. This may reflect postsurgical change but bony destruction due to osteomyelitis cannot be  excluded with a high degree of certainty. Electronically Signed   By: Kerby Moors M.D.   On: 12/02/2021 05:57   DG Chest Port 1 View  Result Date: 12/01/2021 CLINICAL DATA:  Postoperative infection EXAM: PORTABLE CHEST 1 VIEW COMPARISON:  08/13/2021 FINDINGS: Central line tip in the upper right atrium. Heart size is normal. Chronic aortic atherosclerosis. Previous lobectomy on the left. No sign of pulmonary infiltrate. No visible effusion. Old healed rib fracture on the left. Left axillary region vascular stent. IMPRESSION: No active disease. Electronically Signed   By: Nelson Chimes M.D.   On: 12/01/2021 17:26    Dialysis Orders:  Center: [place]  on MWF . 3 hrs 15 min, 180NRe Optiflux, BFR 400, DFR Autoflow 1.5, EDW 49.5 (kg), Dialysate 2.0 K, 2.0 Ca Mircera 225 mcg IV q 2 weeks- last dose 11/24/21 No VDRA  Assessment/Plan:  Right stump infection: On empiric antibiotics. Vascular surgery following and planning for stump revision.   ESRD:  Dialysis on MWF schedule, no emergent indications for RRT today. Will plan for next HD tomorrow. His forearm graft does appear to be usable, we will attempt to cannulate and potentially could remove TDC this admission if graft works well.   Hypertension/volume: BP well controlled, appears euvolemic on exam and CXR was clear 2/17. UF with HD tomorrow as tolerated.   Anemia: Hgb 10.1. On max ESA outpatient. Follow trend. Holding IV Fe due to infection.   Metabolic bone disease: Calcium controlled. Not on VDRA. Continue calcium acetate, follow phos level.   Nutrition:  Renal diet with fluid restrictions. Albumin is low, continue protein supplementation.   Anice Paganini, PA-C 12/03/2021, 8:21 AM  [facility] Pager: [phone]

## 2021-12-03 NOTE — Anesthesia Preprocedure Evaluation (Addendum)
Anesthesia Evaluation  Patient identified by MRN, date of birth, ID band Patient awake    Reviewed: Allergy & Precautions, NPO status , Patient's Chart, lab work & pertinent test results  History of Anesthesia Complications Negative for: history of anesthetic complications  Airway Mallampati: I  TM Distance: >3 FB Neck ROM: Full    Dental   Pulmonary neg pulmonary ROS, former smoker,    Pulmonary exam normal        Cardiovascular hypertension, + Peripheral Vascular Disease and + DVT  Normal cardiovascular exam   Echo 2020: EF 60-65%, nl RV size/fn, valves unremarkable   Neuro/Psych negative neurological ROS     GI/Hepatic PUD, GERD  ,(+) Hepatitis -, C  Endo/Other  diabetes  Renal/GU ESRF and DialysisRenal disease  negative genitourinary   Musculoskeletal negative musculoskeletal ROS (+)   Abdominal   Peds  Hematology  (+) Blood dyscrasia, anemia ,   Anesthesia Other Findings  Nonhealing BKA stump  Reproductive/Obstetrics                           Anesthesia Physical Anesthesia Plan  ASA: 4  Anesthesia Plan: MAC   Post-op Pain Management: Regional block* and Ketamine IV*   Induction: Intravenous  PONV Risk Score and Plan: 1 and Propofol infusion, TIVA and Treatment may vary due to age or medical condition  Airway Management Planned: Natural Airway, Nasal Cannula and Simple Face Mask  Additional Equipment: None  Intra-op Plan:   Post-operative Plan:   Informed Consent: I have reviewed the patients History and Physical, chart, labs and discussed the procedure including the risks, benefits and alternatives for the proposed anesthesia with the patient or authorized representative who has indicated his/her understanding and acceptance.       Plan Discussed with:   Anesthesia Plan Comments:        Anesthesia Quick Evaluation

## 2021-12-04 ENCOUNTER — Other Ambulatory Visit: Payer: Self-pay

## 2021-12-04 ENCOUNTER — Encounter (HOSPITAL_COMMUNITY)
Admission: EM | Disposition: A | Discharge status: Hospice | Payer: Self-pay | Source: Home / Self Care | Attending: Internal Medicine

## 2021-12-04 ENCOUNTER — Inpatient Hospital Stay (HOSPITAL_COMMUNITY): Payer: No Typology Code available for payment source | Admitting: Anesthesiology

## 2021-12-04 DIAGNOSIS — E43 Unspecified severe protein-calorie malnutrition: Secondary | ICD-10-CM | POA: Insufficient documentation

## 2021-12-04 DIAGNOSIS — E1122 Type 2 diabetes mellitus with diabetic chronic kidney disease: Secondary | ICD-10-CM

## 2021-12-04 DIAGNOSIS — N186 End stage renal disease: Secondary | ICD-10-CM

## 2021-12-04 DIAGNOSIS — G9341 Metabolic encephalopathy: Secondary | ICD-10-CM | POA: Diagnosis not present

## 2021-12-04 DIAGNOSIS — T8781 Dehiscence of amputation stump: Secondary | ICD-10-CM | POA: Diagnosis not present

## 2021-12-04 DIAGNOSIS — L03115 Cellulitis of right lower limb: Secondary | ICD-10-CM | POA: Diagnosis not present

## 2021-12-04 DIAGNOSIS — I10 Essential (primary) hypertension: Secondary | ICD-10-CM | POA: Diagnosis not present

## 2021-12-04 DIAGNOSIS — I120 Hypertensive chronic kidney disease with stage 5 chronic kidney disease or end stage renal disease: Secondary | ICD-10-CM

## 2021-12-04 DIAGNOSIS — T8753 Necrosis of amputation stump, right lower extremity: Secondary | ICD-10-CM

## 2021-12-04 HISTORY — PX: WOUND DEBRIDEMENT: SHX247

## 2021-12-04 LAB — CBC
HCT: 28.6 % — ABNORMAL LOW (ref 39.0–52.0)
HCT: 31 % — ABNORMAL LOW (ref 39.0–52.0)
Hemoglobin: 9.5 g/dL — ABNORMAL LOW (ref 13.0–17.0)
Hemoglobin: 10 g/dL — ABNORMAL LOW (ref 13.0–17.0)
MCH: 30.1 pg (ref 26.0–34.0)
MCH: 29.5 pg (ref 26.0–34.0)
MCHC: 33.2 g/dL (ref 30.0–36.0)
MCHC: 32.3 g/dL (ref 30.0–36.0)
MCV: 90.5 fL (ref 80.0–100.0)
MCV: 91.4 fL (ref 80.0–100.0)
Platelets: 162 10*3/uL (ref 150–400)
Platelets: 158 10*3/uL (ref 150–400)
RBC: 3.16 MIL/uL — ABNORMAL LOW (ref 4.22–5.81)
RBC: 3.39 MIL/uL — ABNORMAL LOW (ref 4.22–5.81)
RDW: 18.2 % — ABNORMAL HIGH (ref 11.5–15.5)
RDW: 18.4 % — ABNORMAL HIGH (ref 11.5–15.5)
WBC: 9.1 10*3/uL (ref 4.0–10.5)
WBC: 8.7 10*3/uL (ref 4.0–10.5)
nRBC: 0.2 % (ref 0.0–0.2)
nRBC: 0 % (ref 0.0–0.2)

## 2021-12-04 LAB — BASIC METABOLIC PANEL
Anion gap: 16 — ABNORMAL HIGH (ref 5–15)
BUN: 59 mg/dL — ABNORMAL HIGH (ref 8–23)
CO2: 22 mmol/L (ref 22–32)
Calcium: 8.2 mg/dL — ABNORMAL LOW (ref 8.9–10.3)
Chloride: 91 mmol/L — ABNORMAL LOW (ref 98–111)
Creatinine, Ser: 8.91 mg/dL — ABNORMAL HIGH (ref 0.61–1.24)
GFR, Estimated: 6 mL/min — ABNORMAL LOW (ref >60)
Glucose, Bld: 128 mg/dL — ABNORMAL HIGH (ref 70–99)
Potassium: 3.6 mmol/L (ref 3.5–5.1)
Sodium: 129 mmol/L — ABNORMAL LOW (ref 135–145)

## 2021-12-04 LAB — GLUCOSE, CAPILLARY
Glucose-Capillary: 116 mg/dL — ABNORMAL HIGH (ref 70–99)
Glucose-Capillary: 126 mg/dL — ABNORMAL HIGH (ref 70–99)
Glucose-Capillary: 129 mg/dL — ABNORMAL HIGH (ref 70–99)
Glucose-Capillary: 120 mg/dL — ABNORMAL HIGH (ref 70–99)
Glucose-Capillary: 107 mg/dL — ABNORMAL HIGH (ref 70–99)
Glucose-Capillary: 111 mg/dL — ABNORMAL HIGH (ref 70–99)

## 2021-12-04 LAB — URINE CULTURE

## 2021-12-04 LAB — SURGICAL PCR SCREEN
MRSA, PCR: NEGATIVE
Staphylococcus aureus: NEGATIVE

## 2021-12-04 LAB — HEPARIN LEVEL (UNFRACTIONATED): Heparin Unfractionated: 0.73 IU/mL — ABNORMAL HIGH (ref 0.30–0.70)

## 2021-12-04 LAB — APTT: aPTT: 138 seconds — ABNORMAL HIGH (ref 24–36)

## 2021-12-04 SURGERY — DEBRIDEMENT, WOUND
Anesthesia: Monitor Anesthesia Care | Site: Leg Lower | Laterality: Right

## 2021-12-04 MED ORDER — PHENYLEPHRINE HCL-NACL 20-0.9 MG/250ML-% IV SOLN
INTRAVENOUS | Status: DC | PRN
  Administered 2021-12-04: 30 ug/min via INTRAVENOUS

## 2021-12-04 MED ORDER — LIDOCAINE 2% (20 MG/ML) 5 ML SYRINGE
INTRAMUSCULAR | Status: DC | PRN
  Administered 2021-12-04: 20 mg via INTRAVENOUS

## 2021-12-04 MED ORDER — ORAL CARE MOUTH RINSE: 15.0000 mL | Freq: Once | OROMUCOSAL | Status: AC

## 2021-12-04 MED ORDER — ONDANSETRON HCL 4 MG/2ML IJ SOLN: 4.0000 mg | Freq: Once | INTRAMUSCULAR | Status: DC | PRN

## 2021-12-04 MED ORDER — CHLORHEXIDINE GLUCONATE 0.12 % MT SOLN
15.0000 mL | Freq: Once | OROMUCOSAL | Status: AC
  Administered 2021-12-04: 15 mL via OROMUCOSAL
  Filled 2021-12-04: qty 15

## 2021-12-04 MED ORDER — INSULIN ASPART 100 UNIT/ML IJ SOLN: 0.0000 [IU] | INTRAMUSCULAR | Status: DC | PRN

## 2021-12-04 MED ORDER — FENTANYL CITRATE (PF) 100 MCG/2ML IJ SOLN: 50.0000 ug | Freq: Once | INTRAMUSCULAR | Status: AC

## 2021-12-04 MED ORDER — 0.9 % SODIUM CHLORIDE (POUR BTL) OPTIME
TOPICAL | Status: DC | PRN
  Administered 2021-12-04: 1000 mL

## 2021-12-04 MED ORDER — RENA-VITE PO TABS
1.0000 | ORAL_TABLET | Freq: Every day | ORAL | Status: DC
  Administered 2021-12-04 – 2021-12-07 (×4): 1 via ORAL
  Filled 2021-12-04 (×4): qty 1

## 2021-12-04 MED ORDER — PROPOFOL 500 MG/50ML IV EMUL
INTRAVENOUS | Status: DC | PRN
  Administered 2021-12-04: 40 ug/kg/min via INTRAVENOUS

## 2021-12-04 MED ORDER — FENTANYL CITRATE (PF) 100 MCG/2ML IJ SOLN: 25.0000 ug | INTRAMUSCULAR | Status: DC | PRN

## 2021-12-04 MED ORDER — BUPIVACAINE HCL (PF) 0.5 % IJ SOLN
INTRAMUSCULAR | Status: DC | PRN
  Administered 2021-12-04 (×2): 15 mL via PERINEURAL

## 2021-12-04 MED ORDER — SODIUM CHLORIDE 0.9 % IV SOLN: INTRAVENOUS | Status: DC

## 2021-12-04 MED ORDER — LACTATED RINGERS IV SOLN: INTRAVENOUS | Status: DC

## 2021-12-04 MED ORDER — OXYCODONE HCL 5 MG/5ML PO SOLN: 5.0000 mg | Freq: Once | ORAL | Status: DC | PRN

## 2021-12-04 MED ORDER — FENTANYL CITRATE (PF) 100 MCG/2ML IJ SOLN
INTRAMUSCULAR | Status: AC
  Administered 2021-12-04: 50 ug via INTRAVENOUS
  Filled 2021-12-04: qty 2

## 2021-12-04 MED ORDER — MIDAZOLAM HCL 2 MG/2ML IJ SOLN
INTRAMUSCULAR | Status: AC
  Administered 2021-12-04: 1 mg via INTRAVENOUS
  Filled 2021-12-04: qty 2

## 2021-12-04 MED ORDER — MIDAZOLAM HCL 2 MG/2ML IJ SOLN: 1.0000 mg | Freq: Once | INTRAMUSCULAR | Status: AC

## 2021-12-04 MED ORDER — PROPOFOL 10 MG/ML IV BOLUS
INTRAVENOUS | Status: DC | PRN
  Administered 2021-12-04: 20 mg via INTRAVENOUS

## 2021-12-04 MED ORDER — BUPIVACAINE LIPOSOME 1.3 % IJ SUSP
INTRAMUSCULAR | Status: DC | PRN
  Administered 2021-12-04: 10 mL

## 2021-12-04 MED ORDER — NEPRO/CARBSTEADY PO LIQD
237.0000 mL | Freq: Two times a day (BID) | ORAL | Status: DC
  Administered 2021-12-04 – 2021-12-08 (×3): 237 mL via ORAL

## 2021-12-04 MED ORDER — HEPARIN SODIUM (PORCINE) 1000 UNIT/ML IJ SOLN
INTRAMUSCULAR | Status: AC
  Administered 2021-12-04: 1000 [IU]
  Filled 2021-12-04: qty 4

## 2021-12-04 MED ORDER — OXYCODONE HCL 5 MG PO TABS: 5.0000 mg | ORAL_TABLET | Freq: Once | ORAL | Status: DC | PRN

## 2021-12-04 MED ORDER — HYDROMORPHONE HCL 1 MG/ML IJ SOLN
0.5000 mg | INTRAMUSCULAR | Status: DC | PRN
  Administered 2021-12-04 – 2021-12-06 (×5): 0.5 mg via INTRAVENOUS
  Filled 2021-12-04 (×2): qty 0.5
  Filled 2021-12-04 (×3): qty 1

## 2021-12-04 SURGICAL SUPPLY — 53 items
BAG COUNTER SPONGE SURGICOUNT (BAG) ×3 IMPLANT
BAG SPNG CNTER NS LX DISP (BAG) ×2
BANDAGE ESMARK 6X9 LF (GAUZE/BANDAGES/DRESSINGS) IMPLANT
BLADE SAW SAG 73X25 THK (BLADE) ×1
BLADE SAW SGTL 73X25 THK (BLADE) ×2 IMPLANT
BNDG CMPR 9X6 STRL LF SNTH (GAUZE/BANDAGES/DRESSINGS)
BNDG COHESIVE 6X5 TAN STRL LF (GAUZE/BANDAGES/DRESSINGS) ×3 IMPLANT
BNDG ELASTIC 4X5.8 VLCR STR LF (GAUZE/BANDAGES/DRESSINGS) ×3 IMPLANT
BNDG ELASTIC 6X5.8 VLCR STR LF (GAUZE/BANDAGES/DRESSINGS) ×3 IMPLANT
BNDG ESMARK 6X9 LF (GAUZE/BANDAGES/DRESSINGS)
BNDG GAUZE ELAST 4 BULKY (GAUZE/BANDAGES/DRESSINGS) ×6 IMPLANT
BRUSH SCRUB EZ PLAIN DRY (MISCELLANEOUS) ×4 IMPLANT
CANISTER SUCT 3000ML PPV (MISCELLANEOUS) ×3 IMPLANT
CLIP VESOCCLUDE MED 6/CT (CLIP) IMPLANT
COVER BACK TABLE 60X90IN (DRAPES) IMPLANT
COVER SURGICAL LIGHT HANDLE (MISCELLANEOUS) ×3 IMPLANT
DRAIN CHANNEL 19F RND (DRAIN) IMPLANT
DRAPE HALF SHEET 40X57 (DRAPES) ×3 IMPLANT
DRAPE ORTHO SPLIT 77X108 STRL (DRAPES) ×6
DRAPE SURG ORHT 6 SPLT 77X108 (DRAPES) ×4 IMPLANT
DRSG ADAPTIC 3X8 NADH LF (GAUZE/BANDAGES/DRESSINGS) ×3 IMPLANT
ELECT CAUTERY BLADE 6.4 (BLADE) ×2 IMPLANT
ELECT REM PT RETURN 9FT ADLT (ELECTROSURGICAL) ×3
ELECTRODE REM PT RTRN 9FT ADLT (ELECTROSURGICAL) ×2 IMPLANT
EVACUATOR SILICONE 100CC (DRAIN) IMPLANT
GAUZE SPONGE 4X4 12PLY STRL (GAUZE/BANDAGES/DRESSINGS) ×6 IMPLANT
GLOVE SRG 8 PF TXTR STRL LF DI (GLOVE) ×2 IMPLANT
GLOVE SURG ENC MOIS LTX SZ7.5 (GLOVE) ×3 IMPLANT
GLOVE SURG UNDER POLY LF SZ8 (GLOVE) ×3
GOWN STRL REUS W/ TWL LRG LVL3 (GOWN DISPOSABLE) ×4 IMPLANT
GOWN STRL REUS W/ TWL XL LVL3 (GOWN DISPOSABLE) ×4 IMPLANT
GOWN STRL REUS W/TWL LRG LVL3 (GOWN DISPOSABLE) ×6
GOWN STRL REUS W/TWL XL LVL3 (GOWN DISPOSABLE) ×6
KIT BASIN OR (CUSTOM PROCEDURE TRAY) ×3 IMPLANT
KIT TURNOVER KIT B (KITS) ×3 IMPLANT
NS IRRIG 1000ML POUR BTL (IV SOLUTION) ×3 IMPLANT
PACK GENERAL/GYN (CUSTOM PROCEDURE TRAY) ×3 IMPLANT
PAD ARMBOARD 7.5X6 YLW CONV (MISCELLANEOUS) ×6 IMPLANT
PENCIL BUTTON HOLSTER BLD 10FT (ELECTRODE) ×2 IMPLANT
STAPLER VISISTAT 35W (STAPLE) ×3 IMPLANT
STOCKINETTE IMPERVIOUS LG (DRAPES) ×3 IMPLANT
SUT ETHILON 3 0 PS 1 (SUTURE) IMPLANT
SUT SILK 2 0 (SUTURE) ×3
SUT SILK 2 0 SH CR/8 (SUTURE) ×3 IMPLANT
SUT SILK 2-0 18XBRD TIE 12 (SUTURE) ×2 IMPLANT
SUT SILK 3 0 (SUTURE) ×3
SUT SILK 3-0 18XBRD TIE 12 (SUTURE) ×2 IMPLANT
SUT VIC AB 2-0 CT1 18 (SUTURE) ×6 IMPLANT
SUT VIC AB 3-0 SH 18 (SUTURE) IMPLANT
SWAB CULTURE ESWAB REG 1ML (MISCELLANEOUS) ×4 IMPLANT
TOWEL GREEN STERILE (TOWEL DISPOSABLE) ×6 IMPLANT
UNDERPAD 30X36 HEAVY ABSORB (UNDERPADS AND DIAPERS) ×3 IMPLANT
WATER STERILE IRR 1000ML POUR (IV SOLUTION) ×3 IMPLANT

## 2021-12-04 NOTE — Anesthesia Procedure Notes (Signed)
Procedure Name: MAC Date/Time: 12/04/2021 9:50 AM Performed by: Lorie Phenix, CRNA Pre-anesthesia Checklist: Emergency Drugs available, Suction available, Patient identified and Patient being monitored Oxygen Delivery Method: Simple face mask Placement Confirmation: positive ETCO2

## 2021-12-04 NOTE — Transfer of Care (Signed)
Immediate Anesthesia Transfer of Care Note  Patient: Roger Vazquez  Procedure(s) Performed: DEBRIDEMENT Of right Below the knee amputation - debridement of skin, subqutaneous tissue, muscle and fascia. (Right: Leg Lower)  Patient Location: PACU  Anesthesia Type:MAC and Regional  Level of Consciousness: awake  Airway & Oxygen Therapy: Patient Spontanous Breathing  Post-op Assessment: Report given to RN and Post -op Vital signs reviewed and stable  Post vital signs: Reviewed and stable  Last Vitals:  Vitals Value Taken Time  BP 118/65 12/04/21 1046  Temp    Pulse 76 12/04/21 1047  Resp 12   SpO2 100 % 12/04/21 1047  Vitals shown include unvalidated device data.  Last Pain:  Vitals:   12/04/21 0930  TempSrc:   PainSc: 0-No pain      Patients Stated Pain Goal: 6 (36/68/15 9470)  Complications: No notable events documented.

## 2021-12-04 NOTE — Progress Notes (Signed)
Initial Nutrition Assessment  DOCUMENTATION CODES:   Severe malnutrition in context of chronic illness  INTERVENTION:  -Nepro Shake po BID, each supplement provides 425 kcal and 19 grams protein -renavite daily  NUTRITION DIAGNOSIS:   Severe Malnutrition related to chronic illness (ESRD on HD) as evidenced by severe muscle depletion, severe fat depletion.  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  Pt unable to provide any meaningful history at time of RD visit. Pt had just returned from OR after excisional debridement of amputation.  No family present. Discussed pt with RN.   Pt has not yet had HD since admit. Per Nephrology, pt's EDW is 49.5 kg  PO Intake: 100% x 2 recorded meals   UOP: none documented x24 hours I/O: +1832ml since admit  Medications: phoslo, SSI TID w/ meals, protonix, IV abx Labs: Recent Labs  Lab 12/02/21 0425 12/03/21 0332 12/04/21 0144  NA 138 132* 129*  K 3.3* 4.1 3.6  CL 97* 93* 91*  CO2 24 20* 22  BUN 34* 50* 59*  CREATININE 6.63* 8.09* 8.91*  CALCIUM 8.3* 8.3* 8.2*  GLUCOSE 105* 172* 128*  CBGs: 116-129 x24 hours   NUTRITION - FOCUSED PHYSICAL EXAM: Flowsheet Row Most Recent Value  Orbital Region Moderate depletion  Upper Arm Region Severe depletion  Thoracic and Lumbar Region Severe depletion  Buccal Region Severe depletion  Temple Region Moderate depletion  Clavicle Bone Region Severe depletion  Clavicle and Acromion Bone Region Severe depletion  Scapular Bone Region Severe depletion  Dorsal Hand Severe depletion   Patellar Region Severe depletion  Anterior Thigh Region Severe depletion  Posterior Calf Region Severe depletion  Edema (RD Assessment) Mild  Hair Reviewed  Eyes Reviewed  Mouth Reviewed  Skin Reviewed  Nails Reviewed       Diet Order:   Diet Order             Diet renal/carb modified with fluid restriction Diet-HS Snack? Nothing; Fluid restriction: 1200 mL Fluid; Room service appropriate? Yes; Fluid consistency: Thin  Diet effective now                   EDUCATION NEEDS:   No education needs have been identified at this time  Skin:  Skin Assessment: Skin Integrity Issues: Skin Integrity Issues:: Incisions Incisions: R leg (incision from R BKA)  Last BM:  PTA  Height:   Ht Readings from Last 1 Encounters:  12/01/21 5\' 6"  (1.676 m)    Weight:   Wt Readings from Last 1 Encounters:  12/01/21 50.8 kg    BMI:  Body mass index is 18.08 kg/m.  Estimated Nutritional Needs:   Kcal:  1700-1900  Protein:  85-95 grams  Fluid:  1L+UOP     Theone Stanley., MS, RD, LDN (she/her/hers) RD pager number and weekend/on-call pager number located in [place].

## 2021-12-04 NOTE — Progress Notes (Signed)
[facility] Progress Note   Subjective:  Seen at start of HD today. He is very tired, but otherwise without complaints. S/p R stump revision earlier today - per notes, left open and will need further surgery/conversion to AKA in near future. Plan was to use his R AVG today, but he says he doesn't feel up to it. Will use the [facility] today, but plan to try AVG next HD.  Objective Vitals:   12/04/21 0925 12/04/21 1045 12/04/21 1100 12/04/21 1121  BP: (!) 125/56 118/65 121/63 124/60  Pulse: 81 74 73 73  Resp: 10 10 16 17   Temp:  97.8 F (36.6 C) 97.8 F (36.6 C) 98.2 F (36.8 C)  TempSrc:    Oral  SpO2: 100% 98% 100% 98%  Weight:      Height:       Physical Exam General: Elderly man, NAD. Room air. Drowsy. Heart: RRR; no murmur Lungs: CTA anteriorly Abdomen: soft Extremities: B BKA, L stump without edema. R stump bandaged with ACE wrap. Dialysis Access: TDC in L chest, R forearm loop AVG + bruit  Additional Objective Labs: Basic Metabolic Panel: Recent Labs  Lab 12/02/21 0425 12/03/21 0332 12/04/21 0144  NA 138 132* 129*  K 3.3* 4.1 3.6  CL 97* 93* 91*  CO2 24 20* 22  GLUCOSE 105* 172* 128*  BUN 34* 50* 59*  CREATININE 6.63* 8.09* 8.91*  CALCIUM 8.3* 8.3* 8.2*   Liver Function Tests: Recent Labs  Lab 12/01/21 1815 12/02/21 0425  AST 22 19  ALT 12 9  ALKPHOS 89 76  BILITOT 0.7 0.3  PROT 8.6* 7.4  ALBUMIN 3.2* 2.7*   CBC: Recent Labs  Lab 12/01/21 1815 12/02/21 0425 12/03/21 0332 12/04/21 0144 12/04/21 0735  WBC 11.0* 9.3 9.2 9.1 8.7  NEUTROABS 9.0*  --   --   --   --   HGB 11.2* 10.4* 10.1* 9.5* 10.0*  HCT 34.4* 34.3* 31.6* 28.6* 31.0*  MCV 94.0 96.3 94.0 90.5 91.4  PLT 177 161 156 162 158   Blood Culture    Component Value Date/Time   SDES IN/OUT CATH URINE 12/03/2021 0608   SPECREQUEST  12/03/2021 0973    NONE Performed at [facility], [place] [address] [place], [place] [address]    CULT MULTIPLE SPECIES PRESENT, SUGGEST  RECOLLECTION (A) 12/03/2021 0608   REPTSTATUS 12/04/2021 FINAL 12/03/2021 0608   Medications:  sodium chloride     ceFEPime (MAXIPIME) IV Stopped (12/03/21 2241)   vancomycin      acetaminophen  650 mg Oral Q6H   amLODipine  10 mg Oral Daily   calcium acetate  667 mg Oral TID WC   Chlorhexidine Gluconate Cloth  6 each Topical Q0600   feeding supplement (NEPRO CARB STEADY)  237 mL Oral BID BM   insulin aspart  0-6 Units Subcutaneous TID WC   multivitamin  1 tablet Oral QHS   pantoprazole  40 mg Oral BID    Dialysis Orders: [facility]  on MWF schedule 3 hrs 15 min, 180NRe Optiflux, BFR 400, DFR Autoflow 1.5, EDW 49.5 (kg), Dialysate 2.0 K, 2.0 Ca Mircera 225 mcg IV q 2 weeks- last dose 11/24/21 No VDRA   Assessment/Plan:  Right stump infection: On empiric antibiotics. S/p R stump revision this AM. Per notes, was left open and will need conversion to AKA in near future.  ESRD:  Continue HD per usual MWF schedule - HD today. Felt too tired to have R AVG cannualted today,  will use TDC today - but try to stick AVG next HD.  Hypertension/volume: BP fine. Last CXR clear. Low UF goal.  Anemia: Hgb 10. On max ESA outpatient - not due yet.  Metabolic bone disease: Calcium controlled. Not on VDRA. Continue calcium acetate, follow phos level.   Nutrition:  Renal diet with fluid restrictions. Albumin is low, continue protein supplementation.   Veneta Penton, PA-C 12/04/2021, 1:50 PM  [employer]

## 2021-12-04 NOTE — Anesthesia Postprocedure Evaluation (Signed)
Anesthesia Post Note  Patient: Roger Vazquez  Procedure(s) Performed: DEBRIDEMENT Of right Below the knee amputation - debridement of skin, subqutaneous tissue, muscle and fascia. (Right: Leg Lower)     Patient location during evaluation: PACU Anesthesia Type: Regional Level of consciousness: awake and alert Pain management: pain level controlled Vital Signs Assessment: post-procedure vital signs reviewed and stable Respiratory status: spontaneous breathing, nonlabored ventilation and respiratory function stable Cardiovascular status: blood pressure returned to baseline and stable Postop Assessment: no apparent nausea or vomiting Anesthetic complications: no   No notable events documented.  Last Vitals:  Vitals:   12/04/21 1100 12/04/21 1121  BP: 121/63 124/60  Pulse: 73 73  Resp: 16 17  Temp:  36.8 C  SpO2: 100% 98%    Last Pain:  Vitals:   12/04/21 1121  TempSrc: Oral  PainSc:                  Lidia Collum

## 2021-12-04 NOTE — Anesthesia Procedure Notes (Signed)
Anesthesia Regional Block: Adductor canal block   Pre-Anesthetic Checklist: , timeout performed,  Correct Patient, Correct Site, Correct Laterality,  Correct Procedure, Correct Position, site marked,  Risks and benefits discussed,  Surgical consent,  Pre-op evaluation,  At surgeon's request and post-op pain management  Laterality: Right  Prep: chloraprep       Needles:  Injection technique: Single-shot  Needle Type: Echogenic Stimulator Needle     Needle Length: 10cm  Needle Gauge: 20     Additional Needles:   Procedures:,,,, ultrasound used (permanent image in chart),,    Narrative:  Start time: 12/04/2021 9:22 AM End time: 12/04/2021 9:26 AM Injection made incrementally with aspirations every 5 mL.  Performed by: Personally  Anesthesiologist: Lidia Collum, MD  Additional Notes: Standard monitors applied. Skin prepped. Good needle visualization with ultrasound. Injection made in 5cc increments with no resistance to injection. Patient tolerated the procedure well.

## 2021-12-04 NOTE — Progress Notes (Signed)
[facility]  Subjective  - seems sleepy.   Objective (!) 148/63 82 98.5 F (36.9 C) (Oral) 17 98%  Intake/Output Summary (Last 24 hours) at 12/04/2021 0928 Last data filed at 12/04/2021 0412 Gross per 24 hour  Intake 580.04 ml  Output 0 ml  Net 580.04 ml    Right BKA with eschar  Laboratory Lab Results: Recent Labs    12/04/21 0144 12/04/21 0735  WBC 9.1 8.7  HGB 9.5* 10.0*  HCT 28.6* 31.0*  PLT 162 158   BMET Recent Labs    12/03/21 0332 12/04/21 0144  NA 132* 129*  K 4.1 3.6  CL 93* 91*  CO2 20* 22  GLUCOSE 172* 128*  BUN 50* 59*  CREATININE 8.09* 8.91*  CALCIUM 8.3* 8.2*    COAG Lab Results  Component Value Date   INR 1.3 (H) 12/01/2021   INR 1.1 10/13/2021   INR 1.1 06/08/2021   No results found for: PTT  Assessment/Planning:  71 year old male with nonhealing right below-knee amputation.  Patient has refused conversion to an above-knee amputation after discussion with Dr. Virl Cagey through the weekend.  Plan right BKA revision with debridement and VAC.  It will be highly unlikely that this will heal as discussed with patient.  Marty Heck 12/04/2021 9:28 AM --

## 2021-12-04 NOTE — Progress Notes (Signed)
Stopping heparin gtt as pt scheduled for OR later this AM.

## 2021-12-04 NOTE — Progress Notes (Signed)
[facility] PROGRESS NOTE   Roger Vazquez DUK:[id number] DOB: 28-Oct-1950 DOA: 12/01/2021  3 DOS: the patient was seen and examined on 12/04/2021  PCP: [facility]  Brief History and Hospital Course:  71 y.o. 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  He was transferred to hospital.  Evaluated by vascular surgery.  Was hospitalized for further management.  Plan is for revision of his amputation on 2/20.  Consultants: Vascular surgery.  Nephrology.  Procedures: None yet    Subjective: Patient asleep this morning.  Easily arousable.  Denies any complaints.  Not very communicative.  Not appear to be in any discomfort this morning.     Assessment/Plan:  * Cellulitis of right lower extremity- (present on admission) He underwent right below-knee amputation in January.  Was noted to be febrile while at dialysis on 2/17.  Concern for dehiscence of wound.  Was sent to the ED.   Seen by vascular surgery.  Wound dehiscence was noted.  No purulence however was appreciated. Underwent CT scan of his right lower extremity which did not show any soft tissue gas.  Significant edema was noted. Patient was started on vancomycin and cefepime.  Plan is for revision of his surgery today. Remains afebrile.  WBC is normal.  UTI (urinary tract infection) Apparently patient was complaining of discomfort with urination.  A UA was done which was remarkably abnormal.  Urine culture shows multiple species. Patient is already on antibiotics as discussed earlier.   Bladder scan was ordered and has not been done.  He is not very tender in his abdomen today. Continue to  monitor for now.   Acute metabolic encephalopathy- (present on admission) Noted to be encephalopathic at the time of admission.  CT head was unremarkable.  No focal neurological deficits noted. Mentation seems to be better but still not very communicative and distracted.   ESRD on hemodialysis [facility]) Dialyzed on Monday Wednesday Friday schedule.  He underwent full dialysis on 2/17.   Nephrology is following.  Plan is for dialysis after surgery today   HTN (hypertension)- (present on admission) Blood pressure reasonably well controlled.  Noted to be on amlodipine.    DVT (deep venous thrombosis) ([place])- (present on admission) Patient on Eliquis prior to admission which is currently on hold due to need for surgery in the near future.  He was diagnosed with acute DVT in October 2022.  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 Currently on IV heparin.  Transition back to Eliquis once cleared to do so by vascular surgery.  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 Not noted to be on any glucose lowering agents at home.  CBGs are reasonably well controlled.  Anemia in chronic kidney disease- (present on admission) Hemoglobin noted to be low but stable.  Continue to monitor.     DVT Prophylaxis: Eliquis on hold.  On IV heparin Code Status: Full code Family Communication: Discussed with sister yesterday over the phone. Disposition Plan: Was living with roommate at home.  Disposition to depend on how he does after surgery.  Will need to  be seen by PT and OT.  Status is: Inpatient Remains inpatient appropriate because: Cellulitis right lower extremity, wound dehiscence      Medications: Scheduled:  [MAR Hold] acetaminophen  650 mg Oral Q6H   [MAR Hold] amLODipine  10 mg Oral Daily   [MAR Hold] calcium acetate  667 mg Oral TID WC   chlorhexidine  15 mL Mouth/Throat Once   Or   mouth  rinse  15 mL Mouth Rinse Once   [MAR Hold] Chlorhexidine Gluconate Cloth  6 each Topical Q0600   [MAR Hold] insulin aspart  0-6 Units Subcutaneous TID WC   [MAR Hold] pantoprazole  40 mg Oral BID   Continuous:  [MAR Hold] ceFEPime (MAXIPIME) IV Stopped (12/03/21 2241)   lactated ringers     [MAR Hold] vancomycin     PRN:[MAR Hold] oxyCODONE-acetaminophen  Antibiotics: Anti-infectives (From admission, onward)    Start     Dose/Rate Route Frequency Ordered Stop   12/04/21 1200  vancomycin (VANCOREADY) IVPB 500 mg/100 mL  Status:  Discontinued        500 mg 100 mL/hr over 60 Minutes Intravenous Every M-W-F (Hemodialysis) 12/01/21 1803 12/01/21 2039   12/04/21 1200  [MAR Hold]  vancomycin (VANCOREADY) IVPB 500 mg/100 mL        (MAR Hold since Mon 12/04/2021 at 0849.Hold Reason: Transfer to a Procedural area)   500 mg 100 mL/hr over 60 Minutes Intravenous Every M-W-F (Hemodialysis) 12/01/21 2039 12/08/21 1159   12/03/21 0815  vancomycin (VANCOCIN) IVPB 1000 mg/200 mL premix  Status:  Discontinued        1,000 mg 200 mL/hr over 60 Minutes Intravenous  Once 12/03/21 0726 12/03/21 0733   12/01/21 2200  [MAR Hold]  ceFEPIme (MAXIPIME) 1 g in sodium chloride 0.9 % 100 mL IVPB        (MAR Hold since Mon 12/04/2021 at 0849.Hold Reason: Transfer to a Procedural area)   1 g 200 mL/hr over 30 Minutes Intravenous Every 24 hours 12/01/21 2037 12/08/21 2159   12/01/21 2030  ceFEPIme (MAXIPIME) 2 g in sodium chloride 0.9 % 100 mL IVPB  Status:  Discontinued        2 g 200 mL/hr over 30 Minutes Intravenous  Once 12/01/21 2026 12/01/21 2037   12/01/21 1715  vancomycin (VANCOCIN) IVPB 1000 mg/200 mL premix  Status:  Discontinued        1,000 mg 200 mL/hr over 60 Minutes Intravenous  Once 12/01/21 1702 12/01/21 1921   12/01/21 1715  cefTRIAXone (ROCEPHIN) 2 g in sodium chloride 0.9 % 100 mL IVPB        2 g 200 mL/hr over 30 Minutes Intravenous  Once 12/01/21 1702 12/01/21 1925        Objective:  Vital Signs  Vitals:   12/03/21 0953 12/03/21 1615 12/03/21 2110 12/04/21 0521  BP: 128/72 125/60 (!) 123/58 129/67  Pulse: 78 84 76 71  Resp: 18 20 17 16   Temp: 98.8 F (37.1 C) 98.8 F (37.1 C) 97.7 F (36.5 C) 98.3 F (36.8 C)  TempSrc: Oral  Oral   SpO2: 99% 99% 100% 96%  Weight:      Height:        Intake/Output Summary (Last 24 hours) at 12/04/2021 0856 Last data filed at 12/04/2021 0412 Gross per 24 hour  Intake 820.04 ml  Output 0 ml  Net 820.04 ml    Filed Weights   12/01/21 1700  Weight: 50.8 kg    General appearance: Somnolent but easily  arousable.  In no distress. Resp: Clear to auscultation bilaterally.  Normal effort Cardio: S1-S2 is normal regular.  No S3-S4.  No rubs murmurs or bruit GI: Abdomen is soft.  Nontender nondistended.  Bowel sounds are present normal.  No masses organomegaly Extremities: Bilateral BKA. Neurologic:   No focal neurological deficits.     Lab Results:  Data Reviewed: I have personally reviewed labs and imaging study reports  CBC: Recent Labs  Lab 12/01/21 1815 12/02/21 0425 12/03/21 0332 12/04/21 0144 12/04/21 0735  WBC 11.0* 9.3 9.2 9.1 8.7  NEUTROABS 9.0*  --   --   --   --   HGB 11.2* 10.4* 10.1* 9.5* 10.0*  HCT 34.4* 34.3* 31.6* 28.6* 31.0*  MCV 94.0 96.3 94.0 90.5 91.4  PLT 177 161 156 162 158     Basic Metabolic Panel: Recent Labs  Lab 12/01/21 1815 12/02/21 0425 12/03/21 0332 12/04/21 0144  NA 136 138 132* 129*  K 3.0* 3.3* 4.1 3.6  CL 94* 97* 93* 91*  CO2 26 24 20* 22  GLUCOSE 147* 105* 172* 128*  BUN 26* 34* 50* 59*  CREATININE 5.83* 6.63* 8.09* 8.91*  CALCIUM 8.3* 8.3* 8.3* 8.2*     GFR: Estimated Creatinine Clearance: 5.4 mL/min (A) (by C-G formula based on SCr of 8.91 mg/dL (H)).  Liver Function Tests: Recent Labs  Lab 12/01/21 1815 12/02/21 0425  AST 22 19  ALT 12 9  ALKPHOS 89 76  BILITOT 0.7 0.3  PROT 8.6* 7.4  ALBUMIN 3.2* 2.7*      Recent Labs   Lab 12/01/21 1815  AMMONIA 17     Coagulation Profile: Recent Labs  Lab 12/01/21 1815  INR 1.3*       CBG: Recent Labs  Lab 12/03/21 1614 12/03/21 1650 12/03/21 2112 12/04/21 0517 12/04/21 0828  GLUCAP 63* 75 91 116* 126*      Recent Results (from the past 240 hour(s))  Blood Culture (routine x 2)     Status: None (Preliminary result)   Collection Time: 12/01/21  6:15 PM   Specimen: BLOOD  Result Value Ref Range Status   Specimen Description BLOOD SITE NOT SPECIFIED  Final   Special Requests   Final    BOTTLES DRAWN AEROBIC AND ANAEROBIC Blood Culture adequate volume   Culture   Final    NO GROWTH 2 DAYS Performed at [facility], [place] [address] [place], [place] [address]    Report Status PENDING  Incomplete  Resp Panel by RT-PCR (Flu A&B, Covid) Nasopharyngeal Swab     Status: None   Collection Time: 12/02/21  3:30 AM   Specimen: Nasopharyngeal Swab; Nasopharyngeal(NP) swabs in vial transport medium  Result Value Ref Range Status   SARS Coronavirus 2 by RT PCR NEGATIVE NEGATIVE Final    Comment: (NOTE) SARS-CoV-2 target nucleic acids are NOT DETECTED.  The SARS-CoV-2 RNA is generally detectable in upper respiratory specimens during the acute phase of infection. The lowest concentration of SARS-CoV-2 viral copies this assay can detect is 138 copies/mL. A negative result does not preclude SARS-Cov-2 infection and should not be used as the sole basis for treatment or other patient management decisions. A negative result may occur with  improper specimen collection/handling, submission of specimen other than nasopharyngeal swab, presence of viral mutation(s) within the areas targeted by this assay, and inadequate number of viral copies(<138 copies/mL). A negative result must be combined with clinical observations, patient history, and epidemiological information. The expected result is Negative.  Fact Sheet for Patients:   EntrepreneurPulse.com.au  Fact Sheet for Healthcare Providers:  IncredibleEmployment.be  This test is no t yet approved or cleared by the [place] FDA and  has been authorized for detection and/or diagnosis of SARS-CoV-2 by FDA under an Emergency Use Authorization (EUA). This EUA will remain  in effect (meaning this test can be used) for the duration of the COVID-19 declaration under Section 564(b)(1) of the Act, 21 U.S.C.section 360bbb-3(b)(1), unless the authorization is terminated  or revoked sooner.       Influenza A by PCR NEGATIVE NEGATIVE Final   Influenza B by PCR NEGATIVE NEGATIVE Final    Comment: (NOTE) The Xpert Xpress SARS-CoV-2/FLU/RSV plus assay is intended as an aid in the diagnosis of influenza from Nasopharyngeal swab specimens and should not be used as a sole basis for treatment. Nasal washings and aspirates are unacceptable for Xpert Xpress SARS-CoV-2/FLU/RSV testing.  Fact Sheet for Patients: EntrepreneurPulse.com.au  Fact Sheet for Healthcare Providers: IncredibleEmployment.be  This test is not yet approved or cleared by the [place] FDA and has been authorized for detection and/or diagnosis of SARS-CoV-2 by FDA under an Emergency Use Authorization (EUA). This EUA will remain in effect (meaning this test can be used) for the duration of the COVID-19 declaration under Section 564(b)(1) of the Act, 21 U.S.C. section 360bbb-3(b)(1), unless the authorization is terminated or revoked.  Performed at [facility], [place] [address], [place], [place] [address]   Blood Culture (routine x 2)     Status: None (Preliminary result)   Collection Time: 12/02/21  4:25 AM   Specimen: BLOOD LEFT HAND  Result Value Ref Range Status   Specimen Description BLOOD LEFT HAND  Final   Special Requests AEROBIC BOTTLE ONLY Blood Culture adequate volume  Final   Culture   Final    NO GROWTH  1 DAY Performed at [facility], [place] [address], [place], [place] [address]    Report Status PENDING  Incomplete  Urine Culture     Status: Abnormal   Collection Time: 12/03/21  6:08 AM   Specimen: In/Out Cath Urine  Result Value Ref Range Status   Specimen Description IN/OUT CATH URINE  Final   Special Requests   Final    NONE Performed at [facility], [place] [address] [place], [place] [address]    Culture MULTIPLE SPECIES PRESENT, SUGGEST RECOLLECTION (A)  Final   Report Status 12/04/2021 FINAL  Final  Surgical PCR screen     Status: None   Collection Time: 12/04/21  5:01 AM   Specimen: Nasal Mucosa; Nasal Swab  Result Value Ref Range Status   MRSA, PCR NEGATIVE NEGATIVE Final   Staphylococcus aureus NEGATIVE NEGATIVE Final    Comment: (NOTE) The Xpert SA Assay (FDA approved for NASAL specimens in patients 105 years of age and older), is one component of a comprehensive surveillance program. It is not intended to diagnose infection nor to guide or monitor treatment. Performed at [facility], [place] [address] [place], [place] [address]        Radiology Studies: No results found.     LOS: 3 days   Aleysia Oltmann [employer] on [email or web address]  12/04/2021, 8:56 AM

## 2021-12-04 NOTE — Op Note (Signed)
Date: December 04, 2021  Preoperative diagnosis: Nonhealing right below-knee amputation   Postoperative diagnosis: Same  Procedure: Sharp excisional debridement of right below-knee amputation including skin, subcutaneous tissue, muscle and fascia (6 cm x 8 cm x 3 cm)  Surgeon: Dr. Marty Heck, MD  Assistant: Leontine Locket, PA  Indications: 71 year old male seen over the weekend with nonhealing right below-knee amputation.  Ultimately an above-knee amputation was recommended and he refused.  He presents today for revision versus debridement after risk benefits discussed.  As assistant was needed for exposure and to expedite the case.   Findings: Right below-knee amputation stump had necrotic skin, subcutaneous tissue, muscle and fascia.  There did appear to be purulent drainage and cultures were sent.  The wound was left opened and packed with wet-to-dry dressing after all necrotic tissue debrided.  I think he needs above-knee amputation.    Anesthesia: Regional block  Details: Patient was taken to the operating room after informed consent was obtained.  Placed on the operative table in the supine position.  After anesthesia was induced the right below knee amputation was prepped and draped in standard sterile fashion.  A timeout was performed.  Patient got preoperative antibiotics.  Ultimately initially used a scalpel and began with sharp excisional debridement of the eschar over his right below-knee amputation stump.  Underneath this there was apparent purulent discharge and cultures were sent as well.  The underlying muscle was necrotic as well as the fascia and all of this was debrided back sharply with Metzenbaum scissors and scalepl until we got to healthy tissue.  The tibia is now exposed in the wound.  I did not feel there was enough healthy muscle posteriorly to get any adequate closure and given the infection we decided to leave the wound open.  The wound was copiously irrigated  until the effluent was clear.  This was packed with a wet-to-dry Kerlix after hemostasis was obtained.  Wrapped with an Ace bandage.  Taken to PACU in stable condition.  Complication: None  Condition: Stable  Marty Heck, MD [facility] [place] Office: [place]

## 2021-12-04 NOTE — Anesthesia Procedure Notes (Signed)
Anesthesia Regional Block: Popliteal block   Pre-Anesthetic Checklist: , timeout performed,  Correct Patient, Correct Site, Correct Laterality,  Correct Procedure, Correct Position, site marked,  Risks and benefits discussed,  Surgical consent,  Pre-op evaluation,  At surgeon's request and post-op pain management  Laterality: Right  Prep: chloraprep       Needles:  Injection technique: Single-shot  Needle Type: Echogenic Stimulator Needle     Needle Length: 10cm  Needle Gauge: 20     Additional Needles:   Procedures:,,,, ultrasound used (permanent image in chart),,    Narrative:  Start time: 12/04/2021 9:26 AM End time: 12/04/2021 9:28 AM  Performed by: Personally  Anesthesiologist: Lidia Collum, MD  Additional Notes: Standard monitors applied. Skin prepped. Good needle visualization with ultrasound. Injection made in 5cc increments with no resistance to injection. Patient tolerated the procedure well.

## 2021-12-05 ENCOUNTER — Encounter (HOSPITAL_COMMUNITY): Payer: Self-pay | Admitting: Vascular Surgery

## 2021-12-05 DIAGNOSIS — I10 Essential (primary) hypertension: Secondary | ICD-10-CM | POA: Diagnosis not present

## 2021-12-05 DIAGNOSIS — N186 End stage renal disease: Secondary | ICD-10-CM | POA: Diagnosis not present

## 2021-12-05 DIAGNOSIS — L03115 Cellulitis of right lower limb: Secondary | ICD-10-CM | POA: Diagnosis not present

## 2021-12-05 DIAGNOSIS — G9341 Metabolic encephalopathy: Secondary | ICD-10-CM | POA: Diagnosis not present

## 2021-12-05 LAB — CBC
HCT: 27.9 % — ABNORMAL LOW (ref 39.0–52.0)
Hemoglobin: 9.2 g/dL — ABNORMAL LOW (ref 13.0–17.0)
MCH: 29.9 pg (ref 26.0–34.0)
MCHC: 33 g/dL (ref 30.0–36.0)
MCV: 90.6 fL (ref 80.0–100.0)
Platelets: 164 10*3/uL (ref 150–400)
RBC: 3.08 MIL/uL — ABNORMAL LOW (ref 4.22–5.81)
RDW: 18.6 % — ABNORMAL HIGH (ref 11.5–15.5)
WBC: 8.9 10*3/uL (ref 4.0–10.5)
nRBC: 0.2 % (ref 0.0–0.2)

## 2021-12-05 LAB — GLUCOSE, CAPILLARY
Glucose-Capillary: 80 mg/dL (ref 70–99)
Glucose-Capillary: 164 mg/dL — ABNORMAL HIGH (ref 70–99)
Glucose-Capillary: 57 mg/dL — ABNORMAL LOW (ref 70–99)
Glucose-Capillary: 68 mg/dL — ABNORMAL LOW (ref 70–99)
Glucose-Capillary: 101 mg/dL — ABNORMAL HIGH (ref 70–99)
Glucose-Capillary: 150 mg/dL — ABNORMAL HIGH (ref 70–99)

## 2021-12-05 LAB — HEPARIN LEVEL (UNFRACTIONATED): Heparin Unfractionated: 0.28 IU/mL — ABNORMAL LOW (ref 0.30–0.70)

## 2021-12-05 LAB — APTT: aPTT: 35 seconds (ref 24–36)

## 2021-12-05 LAB — HEPATITIS B SURFACE ANTIBODY, QUANTITATIVE: Hep B S AB Quant (Post): 7.4 m[IU]/mL — ABNORMAL LOW (ref >9.9)

## 2021-12-05 MED ORDER — HEPARIN (PORCINE) 25000 UT/250ML-% IV SOLN
1100.0000 [IU]/h | INTRAVENOUS | Status: DC
  Administered 2021-12-05 – 2021-12-06 (×2): 1100 [IU]/h via INTRAVENOUS
  Filled 2021-12-05 (×2): qty 250

## 2021-12-05 NOTE — Progress Notes (Signed)
Pt receives out-pt HD at [facility] on MWF. Pt arrives at 12:00 for 12:20 chair time. Per chart review, pt admitted from snf. Will assist as needed.   Melven Sartorius Renal Navigator [phone]

## 2021-12-05 NOTE — Progress Notes (Signed)
[facility] PROGRESS NOTE   Roger Vazquez ZHG:[id number] DOB: 1951-03-12 DOA: 12/01/2021  4 DOS: the patient was seen and examined on 12/05/2021  PCP: [facility]  Brief History and Hospital Course:  71 y.o. 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  He was transferred to hospital.  Evaluated by vascular surgery.  Was hospitalized for further management.  He was offered above-knee amputation since chances of healing would be higher.  But patient refused and wanted to just do a revision of his BKA.  This was performed on 2/20  Consultants: Vascular surgery.  Nephrology.  Procedures: Revision of right below-knee amputation    Subjective: Patient sitting up in bed eating his breakfast.  Denies any significant pain.  No shortness of breath.  Not very communicative.     Assessment/Plan:  * Cellulitis of right lower extremity- (present on admission) He underwent right below-knee amputation in January.  Was noted to be febrile while at dialysis on 2/17.  Concern for dehiscence of wound.  Was sent to the ED.   Seen by vascular surgery.  Wound dehiscence was noted.  No purulence however was appreciated. Underwent CT scan of his right lower extremity which did not show any soft tissue gas.  Significant edema was noted. Patient underwent revision surgery on 2/20.  He refused an above-knee amputation. Remains on vancomycin and cefepime. Apparently purulence was noted at the time of surgery.  Cultures were sent and are pending. Remains afebrile.  WBC has been normal.  UTI (urinary tract infection) Apparently patient was complaining of discomfort with  urination.  A UA was done which was remarkably abnormal.  Urine culture shows multiple species. Patient is already on antibiotics as discussed earlier.    Acute metabolic encephalopathy- (present on admission) Noted to be encephalopathic at the time of admission.  CT head was unremarkable.  No focal neurological deficits noted. Mentation seems to have improved.  He is likely back to his baseline now.  ESRD on hemodialysis [facility]) Nephrology is following.  He is dialyzed on Monday Wednesday Friday schedule.  HTN (hypertension)- (present on admission) Blood pressure reasonably well controlled.  Noted to be on amlodipine.    DVT (deep venous thrombosis) ([place])- (present on admission) Patient on Eliquis prior to admission which is currently on hold.  He was diagnosed with acute DVT in October 2022.  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 He was transitioned to IV heparin.  Was held for surgery.  Can be resumed today. Transition back to Eliquis once cleared to do so by vascular surgery.  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 Not noted to be on any glucose lowering agents at home.  CBGs are reasonably well controlled.  Anemia in chronic kidney disease- (present on admission) Hemoglobin noted to be low but stable.  Continue to monitor.     DVT Prophylaxis: Eliquis on hold.  IV heparin to be resumed today. Code Status: Full code Family Communication: Discussed with patient.  No family at bedside. Disposition Plan: Was living with roommate at home.  Disposition to depend on how  he does after surgery.  Will need to be seen by PT and OT.  Status is: Inpatient Remains inpatient appropriate because: Cellulitis right lower extremity, wound dehiscence      Medications: Scheduled:  acetaminophen  650 mg Oral Q6H   amLODipine  10 mg Oral Daily   calcium acetate  667 mg Oral TID WC    Chlorhexidine Gluconate Cloth  6 each Topical Q0600   feeding supplement (NEPRO CARB STEADY)  237 mL Oral BID BM   insulin aspart  0-6 Units Subcutaneous TID WC   multivitamin  1 tablet Oral QHS   pantoprazole  40 mg Oral BID   Continuous:  sodium chloride     ceFEPime (MAXIPIME) IV Stopped (12/04/21 2212)   vancomycin Stopped (12/04/21 1649)   GBT:DVVOHYWVPXTGG (DILAUDID) injection, oxyCODONE-acetaminophen  Antibiotics: Anti-infectives (From admission, onward)    Start     Dose/Rate Route Frequency Ordered Stop   12/04/21 1200  vancomycin (VANCOREADY) IVPB 500 mg/100 mL  Status:  Discontinued        500 mg 100 mL/hr over 60 Minutes Intravenous Every M-W-F (Hemodialysis) 12/01/21 1803 12/01/21 2039   12/04/21 1200  vancomycin (VANCOREADY) IVPB 500 mg/100 mL        500 mg 100 mL/hr over 60 Minutes Intravenous Every M-W-F (Hemodialysis) 12/01/21 2039 12/08/21 1159   12/03/21 0815  vancomycin (VANCOCIN) IVPB 1000 mg/200 mL premix  Status:  Discontinued        1,000 mg 200 mL/hr over 60 Minutes Intravenous  Once 12/03/21 0726 12/03/21 0733   12/01/21 2200  ceFEPIme (MAXIPIME) 1 g in sodium chloride 0.9 % 100 mL IVPB        1 g 200 mL/hr over 30 Minutes Intravenous Every 24 hours 12/01/21 2037 12/08/21 2159   12/01/21 2030  ceFEPIme (MAXIPIME) 2 g in sodium chloride 0.9 % 100 mL IVPB  Status:  Discontinued        2 g 200 mL/hr over 30 Minutes Intravenous  Once 12/01/21 2026 12/01/21 2037   12/01/21 1715  vancomycin (VANCOCIN) IVPB 1000 mg/200 mL premix  Status:  Discontinued        1,000 mg 200 mL/hr over 60 Minutes Intravenous  Once 12/01/21 1702 12/01/21 1921   12/01/21 1715  cefTRIAXone (ROCEPHIN) 2 g in sodium chloride 0.9 % 100 mL IVPB        2 g 200 mL/hr over 30 Minutes Intravenous  Once 12/01/21 1702 12/01/21 1925       Objective:  Vital Signs  Vitals:   12/04/21 2116 12/05/21 0200 12/05/21 0445 12/05/21 0905  BP: 120/66  127/60 132/64  Pulse: 83  73 75  Resp: 18   17 18   Temp: 98.2 F (36.8 C)  98.1 F (36.7 C) 98.2 F (36.8 C)  TempSrc:    Oral  SpO2: 98%  98% 98%  Weight:  50 kg    Height:        Intake/Output Summary (Last 24 hours) at 12/05/2021 0941 Last data filed at 12/05/2021 0900 Gross per 24 hour  Intake 977.15 ml  Output 1760 ml  Net -782.85 ml    Filed Weights   12/04/21 1355 12/04/21 1703 12/05/21 0200  Weight: 50.5 kg 48.8 kg 50 kg    General appearance: Awake alert.  In no distress Resp: Clear to auscultation bilaterally.  Normal effort Cardio: S1-S2 is normal regular.  No S3-S4.  No rubs murmurs or bruit GI: Abdomen is soft.  Nontender nondistended.  Bowel sounds are present  normal.  No masses organomegaly Extremities: Bilateral amputee Neurologic:  No focal neurological deficits.    Lab Results:  Data Reviewed: I have personally reviewed labs and imaging study reports  CBC: Recent Labs  Lab 12/01/21 1815 12/02/21 0425 12/03/21 0332 12/04/21 0144 12/04/21 0735 12/05/21 0348  WBC 11.0* 9.3 9.2 9.1 8.7 8.9  NEUTROABS 9.0*  --   --   --   --   --   HGB 11.2* 10.4* 10.1* 9.5* 10.0* 9.2*  HCT 34.4* 34.3* 31.6* 28.6* 31.0* 27.9*  MCV 94.0 96.3 94.0 90.5 91.4 90.6  PLT 177 161 156 162 158 164     Basic Metabolic Panel: Recent Labs  Lab 12/01/21 1815 12/02/21 0425 12/03/21 0332 12/04/21 0144  NA 136 138 132* 129*  K 3.0* 3.3* 4.1 3.6  CL 94* 97* 93* 91*  CO2 26 24 20* 22  GLUCOSE 147* 105* 172* 128*  BUN 26* 34* 50* 59*  CREATININE 5.83* 6.63* 8.09* 8.91*  CALCIUM 8.3* 8.3* 8.3* 8.2*     GFR: Estimated Creatinine Clearance: 5.3 mL/min (A) (by C-G formula based on SCr of 8.91 mg/dL (H)).  Liver Function Tests: Recent Labs  Lab 12/01/21 1815 12/02/21 0425  AST 22 19  ALT 12 9  ALKPHOS 89 76  BILITOT 0.7 0.3  PROT 8.6* 7.4  ALBUMIN 3.2* 2.7*      Recent Labs  Lab 12/01/21 1815  AMMONIA 17     Coagulation Profile: Recent Labs  Lab 12/01/21 1815  INR 1.3*        CBG: Recent Labs  Lab 12/04/21 1045 12/04/21 1134 12/04/21 1734 12/04/21 2117 12/05/21 0730  GLUCAP 129* 120* 107* 111* 80      Recent Results (from the past 240 hour(s))  Blood Culture (routine x 2)     Status: None (Preliminary result)   Collection Time: 12/01/21  6:15 PM   Specimen: BLOOD  Result Value Ref Range Status   Specimen Description BLOOD SITE NOT SPECIFIED  Final   Special Requests   Final    BOTTLES DRAWN AEROBIC AND ANAEROBIC Blood Culture adequate volume   Culture   Final    NO GROWTH 4 DAYS Performed at [facility], [place] [address], [place], [place] [address]    Report Status PENDING  Incomplete  Resp Panel by RT-PCR (Flu A&B, Covid) Nasopharyngeal Swab     Status: None   Collection Time: 12/02/21  3:30 AM   Specimen: Nasopharyngeal Swab; Nasopharyngeal(NP) swabs in vial transport medium  Result Value Ref Range Status   SARS Coronavirus 2 by RT PCR NEGATIVE NEGATIVE Final    Comment: (NOTE) SARS-CoV-2 target nucleic acids are NOT DETECTED.  The SARS-CoV-2 RNA is generally detectable in upper respiratory specimens during the acute phase of infection. The lowest concentration of SARS-CoV-2 viral copies this assay can detect is 138 copies/mL. A negative result does not preclude SARS-Cov-2 infection and should not be used as the sole basis for treatment or other patient management decisions. A negative result may occur with  improper specimen collection/handling, submission of specimen other than nasopharyngeal swab, presence of viral mutation(s) within the areas targeted by this assay, and inadequate number of viral copies(<138 copies/mL). A negative result must be combined with clinical observations, patient history, and epidemiological information. The expected result is Negative.  Fact Sheet for Patients:  EntrepreneurPulse.com.au  Fact Sheet for Healthcare Providers:   IncredibleEmployment.be  This test is no t yet approved or cleared by the [place] FDA  and  has been authorized for detection and/or diagnosis of SARS-CoV-2 by FDA under an Emergency Use Authorization (EUA). This EUA will remain  in effect (meaning this test can be used) for the duration of the COVID-19 declaration under Section 564(b)(1) of the Act, 21 U.S.C.section 360bbb-3(b)(1), unless the authorization is terminated  or revoked sooner.       Influenza A by PCR NEGATIVE NEGATIVE Final   Influenza B by PCR NEGATIVE NEGATIVE Final    Comment: (NOTE) The Xpert Xpress SARS-CoV-2/FLU/RSV plus assay is intended as an aid in the diagnosis of influenza from Nasopharyngeal swab specimens and should not be used as a sole basis for treatment. Nasal washings and aspirates are unacceptable for Xpert Xpress SARS-CoV-2/FLU/RSV testing.  Fact Sheet for Patients: EntrepreneurPulse.com.au  Fact Sheet for Healthcare Providers: IncredibleEmployment.be  This test is not yet approved or cleared by the [place] FDA and has been authorized for detection and/or diagnosis of SARS-CoV-2 by FDA under an Emergency Use Authorization (EUA). This EUA will remain in effect (meaning this test can be used) for the duration of the COVID-19 declaration under Section 564(b)(1) of the Act, 21 U.S.C. section 360bbb-3(b)(1), unless the authorization is terminated or revoked.  Performed at [facility], [place] [address] [place], [place] [address]   Blood Culture (routine x 2)     Status: None (Preliminary result)   Collection Time: 12/02/21  4:25 AM   Specimen: BLOOD LEFT HAND  Result Value Ref Range Status   Specimen Description BLOOD LEFT HAND  Final   Special Requests AEROBIC BOTTLE ONLY Blood Culture adequate volume  Final   Culture   Final    NO GROWTH 3 DAYS Performed at [facility], [place] [address], [place], [place]  [address]    Report Status PENDING  Incomplete  Urine Culture     Status: Abnormal   Collection Time: 12/03/21  6:08 AM   Specimen: In/Out Cath Urine  Result Value Ref Range Status   Specimen Description IN/OUT CATH URINE  Final   Special Requests   Final    NONE Performed at [facility], [place] [address], [place], [place] [address]    Culture MULTIPLE SPECIES PRESENT, SUGGEST RECOLLECTION (A)  Final   Report Status 12/04/2021 FINAL  Final  Surgical PCR screen     Status: None   Collection Time: 12/04/21  5:01 AM   Specimen: Nasal Mucosa; Nasal Swab  Result Value Ref Range Status   MRSA, PCR NEGATIVE NEGATIVE Final   Staphylococcus aureus NEGATIVE NEGATIVE Final    Comment: (NOTE) The Xpert SA Assay (FDA approved for NASAL specimens in patients 48 years of age and older), is one component of a comprehensive surveillance program. It is not intended to diagnose infection nor to guide or monitor treatment. Performed at [facility], [place] [address], [place], [place] [address]   Aerobic/Anaerobic Culture w Gram Stain (surgical/deep wound)     Status: None (Preliminary result)   Collection Time: 12/04/21 10:19 AM   Specimen: Wound  Result Value Ref Range Status   Specimen Description WOUND  Final   Special Requests RIGHT STUMP SPEC A  Final   Gram Stain   Final    FEW WBC PRESENT, PREDOMINANTLY MONONUCLEAR RARE GRAM POSITIVE COCCI IN PAIRS    Culture   Final    FEW GRAM NEGATIVE RODS CULTURE REINCUBATED FOR BETTER GROWTH SUSCEPTIBILITIES TO FOLLOW Performed at [facility], [place] [address],  [place], [place] [address]    Report Status PENDING  Incomplete       Radiology Studies: No results found.     LOS: 4 days   Levander Katzenstein [employer] on [email or web address]  12/05/2021, 9:41 AM

## 2021-12-05 NOTE — Progress Notes (Signed)
[facility] Progress Note   Subjective:  Seen in room - drowsy appearing. Denies CP, dyspnea, or stump pain at the moment. Did fine with HD yesterday.  Objective Vitals:   12/04/21 2116 12/05/21 0200 12/05/21 0445 12/05/21 0905  BP: 120/66  127/60 132/64  Pulse: 83  73 75  Resp: 18  17 18   Temp: 98.2 F (36.8 C)  98.1 F (36.7 C) 98.2 F (36.8 C)  TempSrc:    Oral  SpO2: 98%  98% 98%  Weight:  50 kg    Height:       Physical Exam General: Elderly man, NAD. Room air. Drowsy. Heart: RRR; no murmur Lungs: CTA anteriorly Abdomen: soft Extremities: B BKA, L stump without edema. R stump bandaged with ACE wrap. Dialysis Access: TDC in L chest, R forearm loop AVG + bruit  Additional Objective Labs: Basic Metabolic Panel: Recent Labs  Lab 12/02/21 0425 12/03/21 0332 12/04/21 0144  NA 138 132* 129*  K 3.3* 4.1 3.6  CL 97* 93* 91*  CO2 24 20* 22  GLUCOSE 105* 172* 128*  BUN 34* 50* 59*  CREATININE 6.63* 8.09* 8.91*  CALCIUM 8.3* 8.3* 8.2*   Liver Function Tests: Recent Labs  Lab 12/01/21 1815 12/02/21 0425  AST 22 19  ALT 12 9  ALKPHOS 89 76  BILITOT 0.7 0.3  PROT 8.6* 7.4  ALBUMIN 3.2* 2.7*   CBC: Recent Labs  Lab 12/01/21 1815 12/02/21 0425 12/03/21 0332 12/04/21 0144 12/04/21 0735 12/05/21 0348  WBC 11.0* 9.3 9.2 9.1 8.7 8.9  NEUTROABS 9.0*  --   --   --   --   --   HGB 11.2* 10.4* 10.1* 9.5* 10.0* 9.2*  HCT 34.4* 34.3* 31.6* 28.6* 31.0* 27.9*  MCV 94.0 96.3 94.0 90.5 91.4 90.6  PLT 177 161 156 162 158 164   Blood Culture    Component Value Date/Time   SDES WOUND 12/04/2021 1019   SPECREQUEST RIGHT STUMP SPEC A 12/04/2021 1019   CULT  12/04/2021 1019    FEW PROTEUS MIRABILIS CULTURE REINCUBATED FOR BETTER GROWTH SUSCEPTIBILITIES TO FOLLOW Performed at [facility], [place] [address] [place], [place] [address]    REPTSTATUS PENDING 12/04/2021 1019   Medications:  sodium chloride     ceFEPime (MAXIPIME) IV Stopped  (12/04/21 2212)   vancomycin Stopped (12/04/21 1649)    acetaminophen  650 mg Oral Q6H   amLODipine  10 mg Oral Daily   calcium acetate  667 mg Oral TID WC   Chlorhexidine Gluconate Cloth  6 each Topical Q0600   feeding supplement (NEPRO CARB STEADY)  237 mL Oral BID BM   insulin aspart  0-6 Units Subcutaneous TID WC   multivitamin  1 tablet Oral QHS   pantoprazole  40 mg Oral BID    Dialysis Orders: [facility]  on MWF schedule 3:15 hr, 400/1.5, EDW 49.5 (kg), 2K/2Ca bath - Mircera 225 mcg IV q 2 weeks- last dose 11/24/21 - No VDRA   Assessment/Plan:  Right stump infection: On IV antibiotics. S/p stump revision 2/20, was left open and will need conversion to AKA in near future.  ESRD:  Continue HD per usual MWF schedule - next HD tomorrow. Has TDC but R AVG is open, will stick AVG next HD.  Hypertension/volume: BP fine. Last CXR clear. Low UF goal.  Anemia: Hgb 10. On max ESA outpatient - not due yet.  Metabolic bone disease: Calcium controlled. Not on VDRA. Continue calcium acetate, follow phos level.  Nutrition:  Renal diet with fluid restrictions. Albumin is low, continue protein supplementation.     Veneta Penton, PA-C 12/05/2021, 10:25 AM  [facility]

## 2021-12-05 NOTE — Progress Notes (Addendum)
[place] for:  restart heparin  Indication:  history of DVT, on Eliquis prior to admission.  Allergies  Allergen Reactions   Sildenafil Other (See Comments)    Pt stated, "Makes my Blood Pressure drop"    Patient Measurements: Height: 5\' 6"  (167.6 cm) Weight: 50 kg (110 lb 3.7 oz) IBW/kg (Calculated) : 63.8 Heparin Dosing Weight: 50.8 kg   Vital Signs: Temp: 98.2 F (36.8 C) (02/21 0905) Temp Source: Oral (02/21 0905) BP: 132/64 (02/21 0905) Pulse Rate: 75 (02/21 0905)  Labs: Recent Labs    12/03/21 0332 12/03/21 1502 12/04/21 0144 12/04/21 0735 12/05/21 0348  HGB 10.1*  --  9.5* 10.0* 9.2*  HCT 31.6*  --  28.6* 31.0* 27.9*  PLT 156  --  162 158 164  APTT 55* 79* 138*  --  35  HEPARINUNFRC 0.88*  --  0.73*  --  0.28*  CREATININE 8.09*  --  8.91*  --   --      Estimated Creatinine Clearance: 5.3 mL/min (A) (by C-G formula based on SCr of 8.91 mg/dL (H)).   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   12/01/2021   amLODipine (NORVASC) 10 MG tablet Take 1 tablet (10 mg total) by mouth daily.   12/01/2021   apixaban (ELIQUIS) 5 MG TABS tablet Take 1 tablet (5 mg total) by mouth 2 (two) times daily. (Patient taking differently: Take 2.5 mg by mouth 2 (two) times daily.) 60 tablet  12/01/2021 at 0900   aspirin 81 MG chewable tablet Chew 81 mg by mouth daily. (0800)   12/01/2021   calcium acetate (PHOSLO) 667 MG capsule Take 667 mg by mouth 3 (three) times daily with meals. (0800, 1300 & 1700)   12/01/2021   ferrous sulfate 325 (65 FE)  MG tablet Take 325 mg by mouth daily with breakfast.   12/01/2021   hydrocortisone (ANUSOL-HC) 25 MG suppository Place 25 mg rectally 2 (two) times daily as needed for hemorrhoids or anal itching.   UNK   lidocaine-prilocaine (EMLA) cream Apply 1 application topically See admin instructions. As needed on dialysis days.(Monday,Wednesday and Friday)   UNK   Multiple Vitamin (MULITIVITAMIN WITH MINERALS) TABS Take 1 tablet by mouth daily. 30 tablet 0 12/01/2021   Nutritional Supplements (NOVASOURCE RENAL PO) Take 237 mLs by mouth daily.   12/01/2021   oxybutynin (DITROPAN) 5 MG tablet Take 2.5 mg by mouth 2 (two) times daily.   12/01/2021   oxyCODONE (OXY IR/ROXICODONE) 5 MG immediate release tablet Take 1 tablet (5 mg total) by mouth 3 (three) times daily as needed for up to 4 doses for severe pain. 4 tablet 0 11/30/2021   pantoprazole (PROTONIX) 40 MG tablet Take 1 tablet (40 mg total) by mouth 2 (two) times daily.   12/01/2021   polyethylene glycol (MIRALAX / GLYCOLAX) 17 g packet Take 17 g by mouth 2 (two) times daily. 14 each 0 12/01/2021   senna (SENOKOT) 8.6 MG TABS tablet Take 1 tablet (  8.6 mg total) by mouth daily. 7 tablet 0 12/01/2021    Assessment: 37 YOM with h/o of ESRD on HD and lower extremity DVT on Eliquis. Pharmacy consulted to dose IV heparin while Eliquis on hold.   Heparin stopped yesterday 2/20 AM prior to surgery.   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 On 2/20 I discussed anticoagulation plan post op with Dr. Maryland Pink and Dr. Antonietta Breach surgeon. Dr. Carlis Abbott said to hold anticoagulation post op until 12/05/21 AM then may resume IV heparin.   Today, POD#1 ,vascular surgery PA noted R BKA stump dressings with some bloody saturated areas and whole stump dressings somewhat damp, thinks  is from the wet dressings more so then active drainage/ bleeding.  I contacted  Karoline Caldwell, PA via phone and confirmed it is still okay to resume IV heparin this morning.    Baseline aPTT this AM is 35 and  Heparin level is 0.28 (off heparin).   Heparin level is still elevated above normal due to previous Eliquis dosing.  Last dose Eliquis taken PTA 2/17 @0900 .    We will monitor /titrate heparin drip based on aPTTs until effect of apixaban on heparin level has resolved.   Goal of Therapy:  Heparin level 0.3-0.7 units/ml aPTT 66-102s seconds Monitor platelets by anticoagulation protocol: Yes   Plan:  Restart IV heparin at 1100 units/hr now. Check 6-8 hour aPTT/HL Daily aPTT, HL, and CBC. Transition back to Eliquis once cleared to do so by vascular surgery.  Nicole Cella, RPh Clinical Pharmacist [phone] **Pharmacist phone directory can now be found on [email or web address] ([email or web address]).  Listed under [place]. 12/05/2021 10:33 AM

## 2021-12-05 NOTE — Progress Notes (Addendum)
°  Progress Note    12/05/2021 7:41 AM 1 Day Post-Op  Subjective:  right leg soreness, encephalopathic    Vitals:   12/04/21 2116 12/05/21 0445  BP: 120/66 127/60  Pulse: 83 73  Resp: 18 17  Temp: 98.2 F (36.8 C) 98.1 F (36.7 C)  SpO2: 98% 98%   Physical Exam: Cardiac:  regular Lungs:  non labored Incisions:  right BKA stump dressed. Dressings with some bloody saturated areas and whole stump dressings somewhat damp Extremities:  left BKA with small eschar, dry Neurologic: alert  CBC    Component Value Date/Time   WBC 8.9 12/05/2021 0348   RBC 3.08 (L) 12/05/2021 0348   HGB 9.2 (L) 12/05/2021 0348   HCT 27.9 (L) 12/05/2021 0348   PLT 164 12/05/2021 0348   MCV 90.6 12/05/2021 0348   MCH 29.9 12/05/2021 0348   MCHC 33.0 12/05/2021 0348   RDW 18.6 (H) 12/05/2021 0348   LYMPHSABS 1.0 12/01/2021 1815   MONOABS 0.9 12/01/2021 1815   EOSABS 0.1 12/01/2021 1815   BASOSABS 0.0 12/01/2021 1815    BMET    Component Value Date/Time   NA 129 (L) 12/04/2021 0144   K 3.6 12/04/2021 0144   CL 91 (L) 12/04/2021 0144   CO2 22 12/04/2021 0144   GLUCOSE 128 (H) 12/04/2021 0144   BUN 59 (H) 12/04/2021 0144   CREATININE 8.91 (H) 12/04/2021 0144   CALCIUM 8.2 (L) 12/04/2021 0144   CALCIUM 7.1 (L) 08/01/2018 0944   GFRNONAA 6 (L) 12/04/2021 0144   GFRAA 5 (L) 08/11/2018 0858    INR    Component Value Date/Time   INR 1.3 (H) 12/01/2021 1815     Intake/Output Summary (Last 24 hours) at 12/05/2021 0741 Last data filed at 12/05/2021 0124 Gross per 24 hour  Intake 857.15 ml  Output 1760 ml  Net -902.85 ml     Assessment/Plan:  71 y.o. male is s/p Sharp excisional debridement of right below-knee amputation including skin, subcutaneous tissue, muscle and fascia  1 Day Post-Op   Dressings somewhat saturated. There was wet to dry Kerlix applied so some of it I think is from the wet dressings more so then active drainage/bleeding.  Will change dressings tomorrow Discussed  with patient that he is at high risk for more proximal amputation   Karoline Caldwell, PA-C [facility] [phone] 12/05/2021 7:41 AM  VASCULAR STAFF ADDENDUM: I have independently interviewed and examined the patient. I agree with the above.  Plan remove the dressing tomorrow. I discussed yesterday's case with Domonic, most notably the significant amount of nonviable tissue present.  We were unable to close his amputation site.  However we will need an above-knee amputation for definitive wound closure.  He is not ready to discuss this further, and asked to see his leg tomorrow at dressing change prior to making any decisions.  Cassandria Santee, MD [facility] Phone Number: [phone] 12/05/2021 11:57 AM

## 2021-12-06 DIAGNOSIS — L03115 Cellulitis of right lower limb: Secondary | ICD-10-CM | POA: Diagnosis not present

## 2021-12-06 LAB — CBC
HCT: 31.1 % — ABNORMAL LOW (ref 39.0–52.0)
Hemoglobin: 10.2 g/dL — ABNORMAL LOW (ref 13.0–17.0)
MCH: 29.5 pg (ref 26.0–34.0)
MCHC: 32.8 g/dL (ref 30.0–36.0)
MCV: 89.9 fL (ref 80.0–100.0)
Platelets: 201 10*3/uL (ref 150–400)
RBC: 3.46 MIL/uL — ABNORMAL LOW (ref 4.22–5.81)
RDW: 18.5 % — ABNORMAL HIGH (ref 11.5–15.5)
WBC: 7.4 10*3/uL (ref 4.0–10.5)
nRBC: 0 % (ref 0.0–0.2)

## 2021-12-06 LAB — APTT: aPTT: 97 seconds — ABNORMAL HIGH (ref 24–36)

## 2021-12-06 LAB — CULTURE, BLOOD (ROUTINE X 2)
Culture: NO GROWTH
Special Requests: ADEQUATE

## 2021-12-06 LAB — BASIC METABOLIC PANEL
Anion gap: 15 (ref 5–15)
BUN: 41 mg/dL — ABNORMAL HIGH (ref 8–23)
CO2: 24 mmol/L (ref 22–32)
Calcium: 9 mg/dL (ref 8.9–10.3)
Chloride: 91 mmol/L — ABNORMAL LOW (ref 98–111)
Creatinine, Ser: 6.61 mg/dL — ABNORMAL HIGH (ref 0.61–1.24)
GFR, Estimated: 8 mL/min — ABNORMAL LOW (ref >60)
Glucose, Bld: 121 mg/dL — ABNORMAL HIGH (ref 70–99)
Potassium: 3.4 mmol/L — ABNORMAL LOW (ref 3.5–5.1)
Sodium: 130 mmol/L — ABNORMAL LOW (ref 135–145)

## 2021-12-06 LAB — GLUCOSE, CAPILLARY
Glucose-Capillary: 116 mg/dL — ABNORMAL HIGH (ref 70–99)
Glucose-Capillary: 148 mg/dL — ABNORMAL HIGH (ref 70–99)
Glucose-Capillary: 128 mg/dL — ABNORMAL HIGH (ref 70–99)
Glucose-Capillary: 102 mg/dL — ABNORMAL HIGH (ref 70–99)

## 2021-12-06 LAB — HEPARIN LEVEL (UNFRACTIONATED): Heparin Unfractionated: 0.37 IU/mL (ref 0.30–0.70)

## 2021-12-06 MED ORDER — LIP MEDEX EX OINT
TOPICAL_OINTMENT | CUTANEOUS | Status: DC | PRN
  Administered 2021-12-07: 1 via TOPICAL
  Filled 2021-12-06: qty 7

## 2021-12-06 MED ORDER — ENOXAPARIN SODIUM 60 MG/0.6ML IJ SOSY
50.0000 mg | PREFILLED_SYRINGE | INTRAMUSCULAR | Status: DC
  Administered 2021-12-06 – 2021-12-07 (×2): 50 mg via SUBCUTANEOUS
  Filled 2021-12-06 (×2): qty 0.6

## 2021-12-06 MED ORDER — OXYCODONE-ACETAMINOPHEN 5-325 MG PO TABS
1.0000 | ORAL_TABLET | Freq: Four times a day (QID) | ORAL | Status: DC | PRN
  Administered 2021-12-06 – 2021-12-09 (×7): 1 via ORAL
  Filled 2021-12-06 (×7): qty 1

## 2021-12-06 NOTE — Progress Notes (Signed)
[place] for:  restart heparin  Indication:  history of DVT, on Eliquis prior to admission.  Allergies  Allergen Reactions   Sildenafil Other (See Comments)    Pt stated, "Makes my Blood Pressure drop"    Patient Measurements: Height: 5\' 6"  (167.6 cm) Weight: 51.3 kg (113 lb 1.5 oz) IBW/kg (Calculated) : 63.8 Heparin Dosing Weight: 50.8 kg   Vital Signs: Temp: 97.4 F (36.3 C) (02/22 0835) Temp Source: Oral (02/22 0835) BP: 132/63 (02/22 0835) Pulse Rate: 70 (02/22 0835)  Labs: Recent Labs    12/04/21 0144 12/04/21 0735 12/05/21 0348 12/06/21 0413  HGB 9.5* 10.0* 9.2* 10.2*  HCT 28.6* 31.0* 27.9* 31.1*  PLT 162 158 164 201  APTT 138*  --  35 97*  HEPARINUNFRC 0.73*  --  0.28* 0.37  CREATININE 8.91*  --   --  6.61*     Estimated Creatinine Clearance: 7.3 mL/min (A) (by C-G formula based on SCr of 6.61 mg/dL (H)).   Medical History: Past Medical History:  Diagnosis Date   Anemia    Blood dyscrasia    history of coagulation deficit per [facility] nursing home   Cancer [facility])    Lung   Diabetes mellitus    Hepatitis    Hypertension    Renal disorder    MWF- [facility]    Medications:  Medications Prior to Admission  Medication Sig Dispense Refill Last Dose   acetaminophen (TYLENOL) 325 MG tablet Take 650 mg by mouth every 6 (six) hours.   12/01/2021   amLODipine (NORVASC) 10 MG tablet Take 1 tablet (10 mg total) by mouth daily.   12/01/2021   apixaban (ELIQUIS) 5 MG TABS tablet Take 1 tablet (5 mg total) by mouth 2 (two) times daily. (Patient taking differently: Take 2.5 mg by mouth 2 (two) times daily.) 60 tablet  12/01/2021 at 0900   aspirin 81 MG chewable tablet Chew 81 mg by mouth daily. (0800)   12/01/2021   calcium acetate (PHOSLO) 667 MG capsule Take 667 mg by mouth 3 (three) times daily with meals. (0800, 1300 & 1700)   12/01/2021   ferrous sulfate 325 (65 FE) MG tablet Take 325 mg by mouth daily with  breakfast.   12/01/2021   hydrocortisone (ANUSOL-HC) 25 MG suppository Place 25 mg rectally 2 (two) times daily as needed for hemorrhoids or anal itching.   UNK   lidocaine-prilocaine (EMLA) cream Apply 1 application topically See admin instructions. As needed on dialysis days.(Monday,Wednesday and Friday)   UNK   Multiple Vitamin (MULITIVITAMIN WITH MINERALS) TABS Take 1 tablet by mouth daily. 30 tablet 0 12/01/2021   Nutritional Supplements (NOVASOURCE RENAL PO) Take 237 mLs by mouth daily.   12/01/2021   oxybutynin (DITROPAN) 5 MG tablet Take 2.5 mg by mouth 2 (two) times daily.   12/01/2021   oxyCODONE (OXY IR/ROXICODONE) 5 MG immediate release tablet Take 1 tablet (5 mg total) by mouth 3 (three) times daily as needed for up to 4 doses for severe pain. 4 tablet 0 11/30/2021   pantoprazole (PROTONIX) 40 MG tablet Take 1 tablet (40 mg total) by mouth 2 (two) times daily.   12/01/2021   polyethylene glycol (MIRALAX / GLYCOLAX) 17 g packet Take 17 g by mouth 2 (two) times daily. 14 each 0 12/01/2021   senna (SENOKOT) 8.6 MG TABS tablet Take 1 tablet (8.6 mg total) by mouth daily. 7 tablet 0 12/01/2021    Assessment: 14 YOM with h/o  of ESRD on HD and lower extremity DVT on Eliquis. Pharmacy consulted to dose IV heparin while Eliquis on hold.   Heparin stopped  2/20 AM prior to surgery 2/20. 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   VVS okay'd to resume heparin infusion on 2/21 POD#1.  We are monitoring aPTTs for now until effect of prior apixaban on heparin level has resolved.  This AM, aPTT is 97, therapeutic on heparin drip 1100 units/hr.   H/H stable. Pltc within normal.  No active bleeding reported. VVS noted  today 2/22 that patient will need AKA.     Goal of Therapy:  Heparin level 0.3-0.7 units/ml aPTT 66-102s seconds Monitor platelets by anticoagulation protocol: Yes   Plan:  Continue IV heparin at 1100 units/hour. Recheck confirmatory ~8 hour aPTT (in HD ~0900,  check PTT @1430  after out of  HD. Daily aPTT, HL, and CBC Transition back to Eliquis once cleared to do so by vascular surgery.  Nicole Cella, RPh Clinical Pharmacist [phone] **Pharmacist phone directory can now be found on [email or web address] ([email or web address]).  Listed under [place]. 12/06/2021 9:00 AM

## 2021-12-06 NOTE — Progress Notes (Signed)
PT Cancellation Note  Patient Details Name: Roger Vazquez MRN: [id number] DOB: [birth year]   Cancelled Treatment:    Reason Eval/Treat Not Completed: Patient at procedure or test/unavailable   Torrey Horseman A. Gilford Rile PT, DPT Acute Rehabilitation Services Pager [phone] Office [phone]   Linna Hoff 12/06/2021, 10:43 AM

## 2021-12-06 NOTE — Progress Notes (Addendum)
°  Progress Note    12/06/2021 8:02 AM 2 Days Post-Op  Subjective:  no complaints   Vitals:   12/05/21 2053 12/06/21 0530  BP: 126/67 139/63  Pulse: 68 66  Resp: 16 18  Temp: 98.5 F (36.9 C) 97.7 F (36.5 C)  SpO2: 95% 100%   Physical Exam: Cardiac:  regular Lungs:  non labored Incisions:  right BKA dressings removed.Tibia exposed in wound bed. Tissue appears okay. Wet to dry dressings reapplied    Neurologic: alert and oriented  CBC    Component Value Date/Time   WBC 7.4 12/06/2021 0413   RBC 3.46 (L) 12/06/2021 0413   HGB 10.2 (L) 12/06/2021 0413   HCT 31.1 (L) 12/06/2021 0413   PLT 201 12/06/2021 0413   MCV 89.9 12/06/2021 0413   MCH 29.5 12/06/2021 0413   MCHC 32.8 12/06/2021 0413   RDW 18.5 (H) 12/06/2021 0413   LYMPHSABS 1.0 12/01/2021 1815   MONOABS 0.9 12/01/2021 1815   EOSABS 0.1 12/01/2021 1815   BASOSABS 0.0 12/01/2021 1815    BMET    Component Value Date/Time   NA 130 (L) 12/06/2021 0413   K 3.4 (L) 12/06/2021 0413   CL 91 (L) 12/06/2021 0413   CO2 24 12/06/2021 0413   GLUCOSE 121 (H) 12/06/2021 0413   BUN 41 (H) 12/06/2021 0413   CREATININE 6.61 (H) 12/06/2021 0413   CALCIUM 9.0 12/06/2021 0413   CALCIUM 7.1 (L) 08/01/2018 0944   GFRNONAA 8 (L) 12/06/2021 0413   GFRAA 5 (L) 08/11/2018 0858    INR    Component Value Date/Time   INR 1.3 (H) 12/01/2021 1815     Intake/Output Summary (Last 24 hours) at 12/06/2021 0802 Last data filed at 12/06/2021 0600 Gross per 24 hour  Intake 651.69 ml  Output 0 ml  Net 651.69 ml     Assessment/Plan:  71 y.o. male is s/p  Sharp excisional debridement of right below-knee amputation including skin, subcutaneous tissue, muscle and fascia 2 Days Post-Op.  Dressings changed today. Tissue appears okay but Tibia is exposed in wound bed Patient has been resistant to AKA but this will be necessary Afebrile. On Cefepime Dr. Virl Cagey will see patient later and further discuss revision to AKA with  him  Karoline Caldwell, PA-C [facility] [phone] 12/06/2021 8:02 AM   VASCULAR STAFF ADDENDUM: I have independently interviewed and examined the patient. I agree with the above.  Encephalopathic. Refusing further amp.   Cassandria Santee, MD [facility] Phone Number: [phone] 12/06/2021 3:57 PM

## 2021-12-06 NOTE — Progress Notes (Signed)
OT Cancellation Note  Patient Details Name: Roger Vazquez MRN: [id number] DOB: May 15, 1951   Cancelled Treatment:    Reason Eval/Treat Not Completed: Patient at procedure or test/ unavailable (HD)  Yelitza Reach,HILLARY 12/06/2021, 9:05 AM Maurie Boettcher, OT/L   Acute OT Clinical Specialist [place] Pager [phone] Office [phone]

## 2021-12-06 NOTE — Progress Notes (Signed)
IVT consulted for blood draw.  Dialysis pt, has a current PIV.  Primary RN, Chelsey and Phlebotomy aware IVT does not do blood draws.

## 2021-12-06 NOTE — Progress Notes (Signed)
[place] for:  restart heparin>>lovenox Indication:  history of DVT, on Eliquis prior to admission.  Allergies  Allergen Reactions   Sildenafil Other (See Comments)    Pt stated, "Makes my Blood Pressure drop"    Patient Measurements: Height: 5\' 6"  (167.6 cm) Weight: 50 kg (110 lb 3.7 oz) IBW/kg (Calculated) : 63.8 Heparin Dosing Weight: 50.8 kg   Vital Signs: Temp: 98.3 F (36.8 C) (02/22 1634) Temp Source: Temporal (02/22 1208) BP: 136/70 (02/22 1634) Pulse Rate: 75 (02/22 1634)  Labs: Recent Labs    12/04/21 0144 12/04/21 0735 12/05/21 0348 12/06/21 0413  HGB 9.5* 10.0* 9.2* 10.2*  HCT 28.6* 31.0* 27.9* 31.1*  PLT 162 158 164 201  APTT 138*  --  35 97*  HEPARINUNFRC 0.73*  --  0.28* 0.37  CREATININE 8.91*  --   --  6.61*     Estimated Creatinine Clearance: 7.1 mL/min (A) (by C-G formula based on SCr of 6.61 mg/dL (H)).   Medical History: Past Medical History:  Diagnosis Date   Anemia    Blood dyscrasia    history of coagulation deficit per [facility] nursing home   Cancer [facility])    Lung   Diabetes mellitus    Hepatitis    Hypertension    Renal disorder    MWF- [facility]    Medications:  Medications Prior to Admission  Medication Sig Dispense Refill Last Dose   acetaminophen (TYLENOL) 325 MG tablet Take 650 mg by mouth every 6 (six) hours.   12/01/2021   amLODipine (NORVASC) 10 MG tablet Take 1 tablet (10 mg total) by mouth daily.   12/01/2021   apixaban (ELIQUIS) 5 MG TABS tablet Take 1 tablet (5 mg total) by mouth 2 (two) times daily. (Patient taking differently: Take 2.5 mg by mouth 2 (two) times daily.) 60 tablet  12/01/2021 at 0900   aspirin 81 MG chewable tablet Chew 81 mg by mouth daily. (0800)   12/01/2021   calcium acetate (PHOSLO) 667 MG capsule Take 667 mg by mouth 3 (three) times daily with meals. (0800, 1300 & 1700)   12/01/2021   ferrous sulfate 325 (65 FE) MG tablet Take 325 mg by mouth  daily with breakfast.   12/01/2021   hydrocortisone (ANUSOL-HC) 25 MG suppository Place 25 mg rectally 2 (two) times daily as needed for hemorrhoids or anal itching.   UNK   lidocaine-prilocaine (EMLA) cream Apply 1 application topically See admin instructions. As needed on dialysis days.(Monday,Wednesday and Friday)   UNK   Multiple Vitamin (MULITIVITAMIN WITH MINERALS) TABS Take 1 tablet by mouth daily. 30 tablet 0 12/01/2021   Nutritional Supplements (NOVASOURCE RENAL PO) Take 237 mLs by mouth daily.   12/01/2021   oxybutynin (DITROPAN) 5 MG tablet Take 2.5 mg by mouth 2 (two) times daily.   12/01/2021   oxyCODONE (OXY IR/ROXICODONE) 5 MG immediate release tablet Take 1 tablet (5 mg total) by mouth 3 (three) times daily as needed for up to 4 doses for severe pain. 4 tablet 0 11/30/2021   pantoprazole (PROTONIX) 40 MG tablet Take 1 tablet (40 mg total) by mouth 2 (two) times daily.   12/01/2021   polyethylene glycol (MIRALAX / GLYCOLAX) 17 g packet Take 17 g by mouth 2 (two) times daily. 14 each 0 12/01/2021   senna (SENOKOT) 8.6 MG TABS tablet Take 1 tablet (8.6 mg total) by mouth daily. 7 tablet 0 12/01/2021    Assessment: 15 YOM with h/o of  ESRD on HD and lower extremity DVT on Eliquis. Pharmacy consulted to dose IV heparin while Eliquis on hold.   Heparin stopped  2/20 AM prior to surgery 2/20. 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   VVS okay'd to resume heparin infusion on 2/21 POD#1.  We are monitoring aPTTs for now until effect of prior apixaban on heparin level has resolved.  This AM, aPTT is 97, therapeutic on heparin drip 1100 units/hr.   H/H stable. Pltc within normal.  No active bleeding reported. VVS noted  today 2/22 that patient will need AKA.    Phlebotomy is having a lot of issue drawing labs. We can't do foot stick either due to bilateral BKA. He has refused any further surgery. D/w Dr. Avon Gully and we will transition him to lovenox for now.   Goal of Therapy:  Anti-Xa 0.6-1 Monitor  platelets by anticoagulation protocol: Yes   Plan:  Lovenox 50mg  SQ qday Transition back to Eliquis once cleared to do so by vascular surgery.  Onnie Boer, PharmD, BCIDP, AAHIVP, CPP Infectious Disease Pharmacist 12/06/2021 6:30 PM

## 2021-12-06 NOTE — Progress Notes (Signed)
[facility] Progress Note   Subjective:   Seen on HD - 1L UFG and tolerating. Keeps saying "ow", but unable to verbalize where - staring blankly at me. ?assuming from his leg. Using R forearm loop AVG today but having some alarming on the machine - unclear if related to the AVG or not.  Objective Vitals:   12/05/21 2053 12/06/21 0529 12/06/21 0530 12/06/21 0835  BP: 126/67  139/63 132/63  Pulse: 68  66 70  Resp: 16  18 15   Temp: 98.5 F (36.9 C)  97.7 F (36.5 C) (!) 97.4 F (36.3 C)  TempSrc:   Oral Oral  SpO2: 95%  100% 97%  Weight:  54 kg  51.3 kg  Height:       Physical Exam General: Elderly man, room air. Confused. Keeps saying "ow." Heart: RRR; no murmur Lungs: CTA anteriorly Abdomen: soft Extremities: B BKA, L stump without edema. R stump bandaged with ACE wrap. Dialysis Access: TDC in L chest, R forearm loop AVG + bruit -> cannulated.  Additional Objective Labs: Basic Metabolic Panel: Recent Labs  Lab 12/03/21 0332 12/04/21 0144 12/06/21 0413  NA 132* 129* 130*  K 4.1 3.6 3.4*  CL 93* 91* 91*  CO2 20* 22 24  GLUCOSE 172* 128* 121*  BUN 50* 59* 41*  CREATININE 8.09* 8.91* 6.61*  CALCIUM 8.3* 8.2* 9.0   Liver Function Tests: Recent Labs  Lab 12/01/21 1815 12/02/21 0425  AST 22 19  ALT 12 9  ALKPHOS 89 76  BILITOT 0.7 0.3  PROT 8.6* 7.4  ALBUMIN 3.2* 2.7*   CBC: Recent Labs  Lab 12/01/21 1815 12/02/21 0425 12/03/21 0332 12/04/21 0144 12/04/21 0735 12/05/21 0348 12/06/21 0413  WBC 11.0*   < > 9.2 9.1 8.7 8.9 7.4  NEUTROABS 9.0*  --   --   --   --   --   --   HGB 11.2*   < > 10.1* 9.5* 10.0* 9.2* 10.2*  HCT 34.4*   < > 31.6* 28.6* 31.0* 27.9* 31.1*  MCV 94.0   < > 94.0 90.5 91.4 90.6 89.9  PLT 177   < > 156 162 158 164 201   < > = values in this interval not displayed.   Blood Culture    Component Value Date/Time   SDES WOUND 12/04/2021 1019   SPECREQUEST RIGHT STUMP SPEC A 12/04/2021 1019   CULT  12/04/2021 1019     FEW PROTEUS MIRABILIS CULTURE REINCUBATED FOR BETTER GROWTH SUSCEPTIBILITIES TO FOLLOW Performed at [facility], [place] [address] [place], [place] [address]    REPTSTATUS PENDING 12/04/2021 1019   Medications:  sodium chloride     ceFEPime (MAXIPIME) IV 1 g (12/05/21 2202)   heparin 1,100 Units/hr (12/05/21 1206)   vancomycin Stopped (12/04/21 1649)    acetaminophen  650 mg Oral Q6H   amLODipine  10 mg Oral Daily   calcium acetate  667 mg Oral TID WC   Chlorhexidine Gluconate Cloth  6 each Topical Q0600   feeding supplement (NEPRO CARB STEADY)  237 mL Oral BID BM   insulin aspart  0-6 Units Subcutaneous TID WC   multivitamin  1 tablet Oral QHS   pantoprazole  40 mg Oral BID    Dialysis Orders: [facility]  on MWF schedule 3:15 hr, 400/1.5, EDW 49.5 (kg), 2K/2Ca bath - Mircera 225 mcg IV q 2 weeks - last dose 11/24/21 - No VDRA   Assessment/Plan:  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 S/p stump revision 2/20, was left open and will need conversion to AKA in near future. Confused again today - pain meds v. infection.  ESRD:  Continue HD per usual MWF schedule - HD today. Has TDC but R AVG is open, cannulated without issues today.  Hypertension/volume: BP fine. Last CXR clear. Low UF goal.  Anemia: Hgb 9.2. On max ESA outpatient - not due yet.  Metabolic bone disease: CorrCa slightly high - not on VDRA. Continue calcium acetate, follow phos level.   Nutrition:  Renal diet with fluid restrictions. Albumin is low, continue protein supplementation.  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  Veneta Penton, PA-C 12/06/2021, 8:54 AM  [employer]

## 2021-12-06 NOTE — Progress Notes (Signed)
[facility] PROGRESS NOTE   Roger Vazquez IWL:[id number] DOB: 1951/09/11 DOA: 12/01/2021  5 DOS: the patient was seen and examined on 12/06/2021  PCP: [facility]  Brief History and Hospital Course:  71 y.o. 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  He was transferred to hospital.  Evaluated by vascular surgery.  Was hospitalized for further management.  He was offered above-knee amputation since chances of healing would be higher.  But patient refused and wanted to just do a revision of his BKA.  This was performed on 2/20  Consultants: Vascular surgery.  Nephrology.  Procedures: Revision of right below-knee amputation  Subjective: Patient currently tolerating dialysis well somewhat somnolent but arousable denies nausea vomiting diarrhea constipation headache fevers chills or chest pain.  Right lower extremity pain ongoing.  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 Patient underwent revision surgery on  2/20.  He refused an above-knee amputation. Remains on vancomycin and cefepime.  Purulence was noted at the time of surgery.  Cultures were sent and are pending.  UTI (urinary tract infection), POA Apparently patient was complaining of discomfort with urination.  A UA was done which was remarkably abnormal.  Urine culture shows multiple species. Patient is already on antibiotics as above.    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 IV heparin resumed post op Transition back to Eliquis once cleared to do so by vascular surgery.  Acute metabolic encephalopathy- (present on admission) Likely back to his baseline now. Transient mental status changes recently in the setting of narcotics  Anemia in chronic kidney disease- (present on admission) Hemoglobin noted to be low but stable.  Continue to monitor.  ESRD on hemodialysis [facility]) Nephrology is following.  He is dialyzed on Monday Wednesday Friday schedule.  HTN (hypertension)- (present on admission) Blood pressure reasonably well controlled.  Noted to be on amlodipine.    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 Not noted to be on any glucose lowering agents at home.  CBGs are reasonably well controlled.  DVT Prophylaxis: Eliquis on hold perioperatively.  IV heparin ongoing Code Status: Full code Family Communication: Discussed with patient.  No family at bedside. Disposition Plan: Living with roommate prior to last admission, currently residing at SNF postoperatively likely to return, PT and OT following  Status is: Inpatient Remains inpatient appropriate because: Cellulitis right lower extremity, wound dehiscence  Medications: Scheduled:  acetaminophen  650 mg Oral Q6H   amLODipine  10 mg Oral Daily   calcium acetate  667  mg Oral TID WC   Chlorhexidine Gluconate Cloth  6 each Topical Q0600   feeding supplement (NEPRO CARB STEADY)  237 mL Oral BID BM   insulin aspart  0-6 Units Subcutaneous TID WC   multivitamin  1 tablet Oral QHS   pantoprazole  40 mg Oral BID   Continuous:  sodium chloride     ceFEPime (MAXIPIME) IV 1 g (12/05/21 2202)   heparin 1,100 Units/hr (12/05/21 1206)   vancomycin Stopped (12/04/21 1649)   [id number] (DILAUDID) injection, oxyCODONE-acetaminophen  Antibiotics: Anti-infectives (From admission, onward)    Start     Dose/Rate Route Frequency Ordered Stop   12/04/21 1200  vancomycin (VANCOREADY) IVPB 500 mg/100 mL  Status:  Discontinued        500 mg 100 mL/hr over 60 Minutes Intravenous Every M-W-F (Hemodialysis) 12/01/21 1803 12/01/21 2039   12/04/21 1200  vancomycin (VANCOREADY) IVPB 500 mg/100 mL        500 mg 100 mL/hr over 60 Minutes Intravenous Every M-W-F (Hemodialysis) 12/01/21 2039 12/08/21 1159   12/03/21 0815  vancomycin (VANCOCIN) IVPB 1000 mg/200 mL premix  Status:  Discontinued        1,000 mg 200 mL/hr over 60 Minutes Intravenous  Once 12/03/21 0726 12/03/21 0733   12/01/21 2200  ceFEPIme (MAXIPIME) 1 g in sodium chloride 0.9 % 100 mL IVPB        1 g 200 mL/hr over 30 Minutes Intravenous Every 24 hours 12/01/21 2037 12/08/21 2159   12/01/21 2030  ceFEPIme (MAXIPIME) 2 g in sodium chloride 0.9 % 100 mL IVPB  Status:  Discontinued        2 g 200 mL/hr over 30 Minutes Intravenous  Once 12/01/21 2026 12/01/21 2037   12/01/21 1715  vancomycin (VANCOCIN) IVPB 1000 mg/200 mL premix  Status:  Discontinued        1,000 mg 200 mL/hr over 60 Minutes Intravenous  Once 12/01/21 1702 12/01/21 1921   12/01/21 1715  cefTRIAXone (ROCEPHIN) 2 g in sodium chloride 0.9 % 100 mL IVPB        2 g 200 mL/hr over 30 Minutes Intravenous  Once 12/01/21 1702 12/01/21 1925       Objective:  Vital Signs  Vitals:   12/05/21 1800 12/05/21 2053 12/06/21 0529 12/06/21 0530   BP: 128/66 126/67  139/63  Pulse: 77 68  66  Resp: 18 16  18   Temp:  98.5 F (36.9 C)  97.7 F (36.5 C)  TempSrc:    Oral  SpO2: 98% 95%  100%  Weight:   54 kg   Height:        Intake/Output Summary (Last 24 hours) at 12/06/2021 0717 Last data filed at 12/06/2021 0600 Gross per 24 hour  Intake 651.69 ml  Output 0 ml  Net 651.69 ml    Filed Weights   12/04/21 1703 12/05/21 0200 12/06/21 0529  Weight: 48.8 kg 50 kg 54 kg    General appearance: Awake alert.  In no distress Resp: Clear to auscultation bilaterally.  Normal effort Cardio: S1-S2 is normal regular.  No S3-S4.  No rubs murmurs or bruit GI: Abdomen is soft.  Nontender nondistended.  Bowel sounds are present normal.  No masses organomegaly Extremities: Bilateral amputee Neurologic:  No focal neurological deficits.    Lab Results:  Data Reviewed: I have personally reviewed labs and  imaging study reports  CBC: Recent Labs  Lab 12/01/21 1815 12/02/21 0425 12/03/21 0332 12/04/21 0144 12/04/21 0735 12/05/21 0348 12/06/21 0413  WBC 11.0*   < > 9.2 9.1 8.7 8.9 7.4  NEUTROABS 9.0*  --   --   --   --   --   --   HGB 11.2*   < > 10.1* 9.5* 10.0* 9.2* 10.2*  HCT 34.4*   < > 31.6* 28.6* 31.0* 27.9* 31.1*  MCV 94.0   < > 94.0 90.5 91.4 90.6 89.9  PLT 177   < > 156 162 158 164 201   < > = values in this interval not displayed.     Basic Metabolic Panel: Recent Labs  Lab 12/01/21 1815 12/02/21 0425 12/03/21 0332 12/04/21 0144 12/06/21 0413  NA 136 138 132* 129* 130*  K 3.0* 3.3* 4.1 3.6 3.4*  CL 94* 97* 93* 91* 91*  CO2 26 24 20* 22 24  GLUCOSE 147* 105* 172* 128* 121*  BUN 26* 34* 50* 59* 41*  CREATININE 5.83* 6.63* 8.09* 8.91* 6.61*  CALCIUM 8.3* 8.3* 8.3* 8.2* 9.0     GFR: Estimated Creatinine Clearance: 7.7 mL/min (A) (by C-G formula based on SCr of 6.61 mg/dL (H)).  Liver Function Tests: Recent Labs  Lab 12/01/21 1815 12/02/21 0425  AST 22 19  ALT 12 9  ALKPHOS 89 76  BILITOT 0.7 0.3   PROT 8.6* 7.4  ALBUMIN 3.2* 2.7*      Recent Labs  Lab 12/01/21 1815  AMMONIA 17     Coagulation Profile: Recent Labs  Lab 12/01/21 1815  INR 1.3*       CBG: Recent Labs  Lab 12/05/21 1114 12/05/21 1654 12/05/21 1722 12/05/21 1737 12/05/21 2054  GLUCAP 164* 57* 68* 101* 150*      Recent Results (from the past 240 hour(s))  Blood Culture (routine x 2)     Status: None (Preliminary result)   Collection Time: 12/01/21  6:15 PM   Specimen: BLOOD  Result Value Ref Range Status   Specimen Description BLOOD SITE NOT SPECIFIED  Final   Special Requests   Final    BOTTLES DRAWN AEROBIC AND ANAEROBIC Blood Culture adequate volume   Culture   Final    NO GROWTH 4 DAYS Performed at [facility], [place] [address] [place], [place] [address]    Report Status PENDING  Incomplete  Resp Panel by RT-PCR (Flu A&B, Covid) Nasopharyngeal Swab     Status: None   Collection Time: 12/02/21  3:30 AM   Specimen: Nasopharyngeal Swab; Nasopharyngeal(NP) swabs in vial transport medium  Result Value Ref Range Status   SARS Coronavirus 2 by RT PCR NEGATIVE NEGATIVE Final    Comment: (NOTE) SARS-CoV-2 target nucleic acids are NOT DETECTED.  The SARS-CoV-2 RNA is generally detectable in upper respiratory specimens during the acute phase of infection. The lowest concentration of SARS-CoV-2 viral copies this assay can detect is 138 copies/mL. A negative result does not preclude SARS-Cov-2 infection and should not be used as the sole basis for treatment or other patient management decisions. A negative result may occur with  improper specimen collection/handling, submission of specimen other than nasopharyngeal swab, presence of viral mutation(s) within the areas targeted by this assay, and inadequate number of viral copies(<138 copies/mL). A negative result must be combined with clinical observations, patient history, and epidemiological information. The expected result is  Negative.  Fact Sheet for Patients:  EntrepreneurPulse.com.au  Fact Sheet for Healthcare  Providers:  IncredibleEmployment.be  This test is no t yet approved or cleared by the [place] and  has been authorized for detection and/or diagnosis of SARS-CoV-2 by FDA under an Emergency Use Authorization (EUA). This EUA will remain  in effect (meaning this test can be used) for the duration of the COVID-19 declaration under Section 564(b)(1) of the Act, 21 U.S.C.section 360bbb-3(b)(1), unless the authorization is terminated  or revoked sooner.       Influenza A by PCR NEGATIVE NEGATIVE Final   Influenza B by PCR NEGATIVE NEGATIVE Final    Comment: (NOTE) The Xpert Xpress SARS-CoV-2/FLU/RSV plus assay is intended as an aid in the diagnosis of influenza from Nasopharyngeal swab specimens and should not be used as a sole basis for treatment. Nasal washings and aspirates are unacceptable for Xpert Xpress SARS-CoV-2/FLU/RSV testing.  Fact Sheet for Patients: EntrepreneurPulse.com.au  Fact Sheet for Healthcare Providers: IncredibleEmployment.be  This test is not yet approved or cleared by the [place] FDA and has been authorized for detection and/or diagnosis of SARS-CoV-2 by FDA under an Emergency Use Authorization (EUA). This EUA will remain in effect (meaning this test can be used) for the duration of the COVID-19 declaration under Section 564(b)(1) of the Act, 21 U.S.C. section 360bbb-3(b)(1), unless the authorization is terminated or revoked.  Performed at [facility], [place] [address] [place], [place] [address]   Blood Culture (routine x 2)     Status: None (Preliminary result)   Collection Time: 12/02/21  4:25 AM   Specimen: BLOOD LEFT HAND  Result Value Ref Range Status   Specimen Description BLOOD LEFT HAND  Final   Special Requests AEROBIC BOTTLE ONLY Blood Culture adequate volume   Final   Culture   Final    NO GROWTH 3 DAYS Performed at [facility], [place] [address] [place], [place] [address]    Report Status PENDING  Incomplete  Urine Culture     Status: Abnormal   Collection Time: 12/03/21  6:08 AM   Specimen: In/Out Cath Urine  Result Value Ref Range Status   Specimen Description IN/OUT CATH URINE  Final   Special Requests   Final    NONE Performed at [facility], [place] [address], [place], [place] [address]    Culture MULTIPLE SPECIES PRESENT, SUGGEST RECOLLECTION (A)  Final   Report Status 12/04/2021 FINAL  Final  Surgical PCR screen     Status: None   Collection Time: 12/04/21  5:01 AM   Specimen: Nasal Mucosa; Nasal Swab  Result Value Ref Range Status   MRSA, PCR NEGATIVE NEGATIVE Final   Staphylococcus aureus NEGATIVE NEGATIVE Final    Comment: (NOTE) The Xpert SA Assay (FDA approved for NASAL specimens in patients 58 years of age and older), is one component of a comprehensive surveillance program. It is not intended to diagnose infection nor to guide or monitor treatment. Performed at [facility], [place] [address], [place], [place] [address]   Aerobic/Anaerobic Culture w Gram Stain (surgical/deep wound)     Status: None (Preliminary result)   Collection Time: 12/04/21 10:19 AM   Specimen: Wound  Result Value Ref Range Status   Specimen Description WOUND  Final   Special Requests RIGHT STUMP SPEC A  Final   Gram Stain   Final    FEW WBC PRESENT, PREDOMINANTLY MONONUCLEAR RARE GRAM POSITIVE COCCI IN PAIRS    Culture   Final    FEW PROTEUS MIRABILIS CULTURE  REINCUBATED FOR BETTER GROWTH SUSCEPTIBILITIES TO FOLLOW Performed at [facility], [place] [address] [place], [place] [address]    Report Status PENDING  Incomplete       Radiology Studies: No results found.   LOS: 5 days   Little Ishikawa  [facility] Pager on [email or web address]  12/06/2021, 7:17 AM

## 2021-12-07 DIAGNOSIS — L03115 Cellulitis of right lower limb: Secondary | ICD-10-CM | POA: Diagnosis not present

## 2021-12-07 LAB — CULTURE, BLOOD (ROUTINE X 2)
Culture: NO GROWTH
Special Requests: ADEQUATE

## 2021-12-07 LAB — CBC
HCT: 30.7 % — ABNORMAL LOW (ref 39.0–52.0)
Hemoglobin: 10 g/dL — ABNORMAL LOW (ref 13.0–17.0)
MCH: 29.5 pg (ref 26.0–34.0)
MCHC: 32.6 g/dL (ref 30.0–36.0)
MCV: 90.6 fL (ref 80.0–100.0)
Platelets: 152 10*3/uL (ref 150–400)
RBC: 3.39 MIL/uL — ABNORMAL LOW (ref 4.22–5.81)
RDW: 18.7 % — ABNORMAL HIGH (ref 11.5–15.5)
WBC: 6.3 10*3/uL (ref 4.0–10.5)
nRBC: 0 % (ref 0.0–0.2)

## 2021-12-07 LAB — GLUCOSE, CAPILLARY
Glucose-Capillary: 77 mg/dL (ref 70–99)
Glucose-Capillary: 77 mg/dL (ref 70–99)
Glucose-Capillary: 92 mg/dL (ref 70–99)
Glucose-Capillary: 85 mg/dL (ref 70–99)

## 2021-12-07 NOTE — TOC Progression Note (Signed)
Transition of Care [facility]) - Initial/Assessment Note    Patient Details  Name: Roger Vazquez MRN: [id number] Date of Birth: [birth year]  Transition of Care [facility]) CM/SW Contact:    Milinda Antis, [place] Phone Number: 12/07/2021, 10:36 AM  Clinical Narrative:                 CSW spoke with admissions at [facility] SN[facility].  The patient is from the facility but did not do a bed hold.  CSW was instructed to contact the facility when the patient is ready.  TOC will continue to follow.          Patient Goals and CMS Choice        Expected Discharge Plan and Services                                                Prior Living Arrangements/Services                       Activities of Daily Living Home Assistive Devices/Equipment: Wheelchair ADL Screening (condition at time of admission) Patient's cognitive ability adequate to safely complete daily activities?: No Is the patient deaf or have difficulty hearing?: No Does the patient have difficulty seeing, even when wearing glasses/contacts?: Yes Does the patient have difficulty concentrating, remembering, or making decisions?: Yes Patient able to express need for assistance with ADLs?: Yes Does the patient have difficulty dressing or bathing?: Yes Independently performs ADLs?: No Communication: Independent Dressing (OT): Needs assistance Is this a change from baseline?: Pre-admission baseline Grooming: Independent Feeding: Independent Bathing: Independent with device (comment) Toileting: Needs assistance Is this a change from baseline?: Pre-admission baseline In/Out Bed: Needs assistance Is this a change from baseline?: Pre-admission baseline Walks in Home: Dependent Is this a change from baseline?: Pre-admission baseline Does the patient have difficulty walking or climbing stairs?: Yes Weakness of Legs: None Weakness of Arms/Hands: Both  Permission Sought/Granted                  Emotional  Assessment              Admission diagnosis:  Cellulitis [L03.90] Infection of amputation stump of right lower extremity ([place]) [T87.43] Patient Active Problem List   Diagnosis Date Noted   Protein-calorie malnutrition, severe 12/04/2021   UTI (urinary tract infection) 12/03/2021   Cellulitis of right lower extremity 12/02/2021   Postoperative wound dehiscence 12/02/2021   Diverticulosis of colon without hemorrhage    Acute gastric ulcer without hemorrhage or perforation    Acute osteomyelitis of right calcaneus (HCC)    Chronic anticoagulation    Heel ulcer, right, with unspecified severity ([place]) 10/13/2021   DVT (deep venous thrombosis) ([place]) 10/13/2021   Lymphadenopathy 10/13/2021   COVID-19 19/41/7408   Acute metabolic encephalopathy 14/48/1856   COVID-19 virus infection 08/12/2021   Peripheral vascular disease ([place]) 06/09/2021   S/P BKA (below knee amputation), left ([place]) 06/08/2021   Status post amputation of foot through metatarsal bone ([place])    Altered mental status 05/19/2021   Left foot infection    Alcohol abuse 05/16/2021   Erectile dysfunction 05/16/2021   Hydronephrosis 05/16/2021   Acute hepatitis C 05/16/2021   Diabetic retinopathy ([place]) 05/16/2021   History of hemodialysis 05/16/2021   Iron deficiency anemia 05/16/2021   Malignant neoplasm of upper lobe, left bronchus or  lung ([place]) 05/16/2021   Noncompliance with medication regimen 05/16/2021   Non-small cell lung cancer ([place]) 05/16/2021   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   Vitreous hemorrhage, left eye ([place]) 05/16/2021   Cellulitis of left lower extremity    Gangrene ([place]) 04/27/2021   Diarrhea, unspecified 04/29/2019   Mild protein-calorie malnutrition ([place]) 08/20/2018   Fever    Infection of AV graft for dialysis ([place]) 08/05/2018   Symptomatic anemia 08/05/2018   Hypokalemia 08/05/2018   GERD (gastroesophageal reflux disease) 08/05/2018   Sepsis due to unspecified Staphylococcus ([place])  08/02/2018   ESRD on hemodialysis ([place])    Bacteremia due to methicillin susceptible Staphylococcus aureus (MSSA) 07/28/2018   Sepsis ([place]) 07/27/2018   Headache 01/14/2017   Fluid overload, unspecified 12/13/2015   Anemia in chronic kidney disease 12/09/2015   Chronic kidney disease, stage 5 ([place]) 12/09/2015   Coagulation defect, unspecified ([place]) 12/09/2015   Encounter for adjustment and management of vascular access device 12/09/2015   Other secondary pulmonary hypertension ([place]) 12/09/2015   Pruritus, unspecified 12/09/2015   Secondary hyperparathyroidism of renal origin ([place]) 12/09/2015   Shortness of breath 12/09/2015   Abdominal pain, other specified site 10/07/2011   Nausea and vomiting 10/07/2011   VITAMIN D DEFICIENCY 03/19/2008   FRACTURE, TIBIA 02/09/2008   HTN (hypertension) 11/25/2007   HEPATITIS C 05/27/2007   Type 2 diabetes mellitus with chronic kidney disease on chronic dialysis ([place]) 05/27/2007   ERECTILE DYSFUNCTION 05/27/2007   ABUSE, ALCOHOL, IN REMISSION 05/27/2007   ABUSE, OPIOID, IN REMISSION 05/27/2007   ABUSE, COCAINE, IN REMISSION 05/27/2007   PCP:  Clinic, [place]:   [facility] [place], [place] [address] [place] [address] Phone: [phone] Fax: [phone]     Social Determinants of Health (SDOH) Interventions    Readmission Risk Interventions Readmission Risk Prevention Plan 05/30/2021  Transportation Screening Complete  SW Recovery Care/Counseling Consult Complete  Skilled Nursing Facility Complete  Some recent data might be hidden

## 2021-12-07 NOTE — Progress Notes (Signed)
[facility] Progress Note   Subjective:  Seen in room. Appears comfortable. Denies CP/dyspnea. Ongoing leg pain.  Objective Vitals:   12/06/21 2131 12/07/21 0500 12/07/21 0548 12/07/21 0927  BP: (!) 145/92  116/60 112/62  Pulse: 78  65 68  Resp: 18  16 18   Temp: 97.9 F (36.6 C)  97.9 F (36.6 C) 97.8 F (36.6 C)  TempSrc: Oral   Oral  SpO2: 99%  98% 99%  Weight:  54.2 kg    Height:       Physical Exam General: Elderly man, room air.  Heart: RRR; no murmur Lungs: CTA anteriorly Abdomen: soft Extremities: B BKA, L stump without edema. R stump bandaged with ACE wrap. Dialysis Access: TDC in L chest, R forearm loop AVG + bruit    Additional Objective Labs: Basic Metabolic Panel: Recent Labs  Lab 12/03/21 0332 12/04/21 0144 12/06/21 0413  NA 132* 129* 130*  K 4.1 3.6 3.4*  CL 93* 91* 91*  CO2 20* 22 24  GLUCOSE 172* 128* 121*  BUN 50* 59* 41*  CREATININE 8.09* 8.91* 6.61*  CALCIUM 8.3* 8.2* 9.0   Liver Function Tests: Recent Labs  Lab 12/01/21 1815 12/02/21 0425  AST 22 19  ALT 12 9  ALKPHOS 89 76  BILITOT 0.7 0.3  PROT 8.6* 7.4  ALBUMIN 3.2* 2.7*   CBC: Recent Labs  Lab 12/01/21 1815 12/02/21 0425 12/04/21 0144 12/04/21 0735 12/05/21 0348 12/06/21 0413 12/07/21 0443  WBC 11.0*   < > 9.1 8.7 8.9 7.4 6.3  NEUTROABS 9.0*  --   --   --   --   --   --   HGB 11.2*   < > 9.5* 10.0* 9.2* 10.2* 10.0*  HCT 34.4*   < > 28.6* 31.0* 27.9* 31.1* 30.7*  MCV 94.0   < > 90.5 91.4 90.6 89.9 90.6  PLT 177   < > 162 158 164 201 152   < > = values in this interval not displayed.   CBG: Recent Labs  Lab 12/06/21 0719 12/06/21 1253 12/06/21 1630 12/06/21 2129 12/07/21 0737  GLUCAP 116* 148* 128* 102* 77   Medications:  sodium chloride     ceFEPime (MAXIPIME) IV 1 g (12/06/21 2124)    acetaminophen  650 mg Oral Q6H   amLODipine  10 mg Oral Daily   calcium acetate  667 mg Oral TID WC   Chlorhexidine Gluconate Cloth  6 each Topical  Q0600   enoxaparin (LOVENOX) injection  50 mg Subcutaneous Q24H   feeding supplement (NEPRO CARB STEADY)  237 mL Oral BID BM   insulin aspart  0-6 Units Subcutaneous TID WC   multivitamin  1 tablet Oral QHS   pantoprazole  40 mg Oral BID    Dialysis Orders: [facility]  on MWF schedule 3:15 hr, 400/1.5, EDW 49.5 (kg), 2K/2Ca bath - Mircera 225 mcg IV q 2 weeks - last dose 11/24/21 - No VDRA   Assessment/Plan:  Right stump infection: On IV Vanc/Cefepime. S/p stump revision 2/20, was left open and will need conversion to AKA in near future.   ESRD:  Continue HD per usual MWF schedule - HD tomorrow. Able to use R AVG without issues last HD. If successful again, will have TDC removed.    Hypertension/volume: BP fine. Last CXR clear. Low UF goal.  Anemia: Hgb 10. On max ESA outpatient, due next HD.  Metabolic bone disease: CorrCa slightly high - not on VDRA. Continue calcium acetate, follow phos level.  Nutrition:  Renal diet with fluid restrictions. Albumin is low, continue protein supplementation.  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  Veneta Penton, PA-C 12/07/2021, 9:56 AM  [employer]

## 2021-12-07 NOTE — Evaluation (Signed)
Physical Therapy Evaluation Patient Details Name: Roger Vazquez MRN: [id number] DOB: 20-Nov-1950 Today's Date: 12/07/2021  History of Present Illness  Roger Vazquez is a 71 y.o. male who presented with wound dehiscence of R BKA performed 10/20/20. Revision to R AKA recommended but pt has been declining procedure. PMH: ESRD on hemodialysis, diabetes mellitus, hypertension, lower extremity DVT, hx of L BKA.  Clinical Impression  Patient admitted with above findings. Per chart, patient was at [facility] initially for rehab but has transitioned to LTC. Patient is poor historian but states they utilize mechanical lift for OOB mobility. Patient unable to provide further information. Patient presents with weakness, impaired functional mobility, impaired coordination, impaired cognition, and pain. Patient requires maxA for rolling L/R for pericare. Patient will benefit from skilled PT services during acute stay to address listed deficits and as a trial to gauge potential for recovery and decrease burden of care. Recommend SNF at discharge.        Recommendations for follow up therapy are one component of a multi-disciplinary discharge planning process, led by the attending physician.  Recommendations may be updated based on patient status, additional functional criteria and insurance authorization.  Follow Up Recommendations Skilled nursing-short term rehab (<3 hours/day)    Assistance Recommended at Discharge Frequent or constant Supervision/Assistance  Patient can return home with the following  Two people to help with walking and/or transfers;Assistance with cooking/housework;Direct supervision/assist for medications management;Direct supervision/assist for financial management;Assist for transportation;Help with stairs or ramp for entrance    Equipment Recommendations None recommended by PT  Recommendations for Other Services       Functional Status Assessment Patient has had a recent  decline in their functional status and/or demonstrates limited ability to make significant improvements in function in a reasonable and predictable amount of time     Precautions / Restrictions Precautions Precautions: Fall Precaution Comments: hx of L BKA, nonhealing R BKA Restrictions Weight Bearing Restrictions: Yes RLE Weight Bearing: Non weight bearing LLE Weight Bearing: Non weight bearing      Mobility  Bed Mobility Overal bed mobility: Needs Assistance Bed Mobility: Rolling Rolling: Max assist         General bed mobility comments: able to assist in rolling by reaching toward bedrail though weak grasp noted.    Transfers                        Ambulation/Gait                  Stairs            Wheelchair Mobility    Modified Rankin (Stroke Patients Only)       Balance                                             Pertinent Vitals/Pain Pain Assessment Pain Assessment: Faces Faces Pain Scale: Hurts little more Pain Location: R residual limb with movement Pain Descriptors / Indicators: Discomfort, Moaning Pain Intervention(s): Monitored during session    Home Living Family/patient expects to be discharged to:: Skilled nursing facility                   Additional Comments: From [facility] as LTC resident per chart    Prior Function Prior Level of Function : Patient poor historian/Family not available  Mobility Comments: Pt reports use of hoyer lift for transfers to wheelchair/dialysis chair ADLs Comments: poor historian, likely assisted bed level for majority of ADLs     Hand Dominance   Dominant Hand: Right    Extremity/Trunk Assessment   Upper Extremity Assessment Upper Extremity Assessment: Defer to OT evaluation RUE Deficits / Details: edema noted around forearm, likely related to HD?Marland Kitchen difficulty holding phone d/t poor coordination RUE Coordination: decreased fine  motor;decreased gross motor LUE Coordination: decreased fine motor;decreased gross motor    Lower Extremity Assessment Lower Extremity Assessment: RLE deficits/detail;LLE deficits/detail RLE Deficits / Details: hx of BKA; difficulty lifting against gravity and reporting pain with movement RLE: Unable to fully assess due to pain LLE Deficits / Details: hx of L BKA; reporting pain with movement LLE: Unable to fully assess due to pain    Cervical / Trunk Assessment Cervical / Trunk Assessment: Normal  Communication   Communication: Expressive difficulties  Cognition Arousal/Alertness: Awake/alert Behavior During Therapy: Flat affect Overall Cognitive Status: No family/caregiver present to determine baseline cognitive functioning                                 General Comments: slow processing, increased time to follow commands, requires repetition. difficulty word finding and expressing thoughts, becomes agitated and deflects his frustration to others        General Comments General comments (skin integrity, edema, etc.): Encouraged B knee extension to prevent contractures. noted with small open sore on bottom when completing peri care - RN aware    Exercises     Assessment/Plan    PT Assessment Patient needs continued PT services  PT Problem List Decreased strength;Decreased range of motion;Decreased balance;Decreased mobility;Decreased cognition;Decreased knowledge of use of DME;Decreased safety awareness;Decreased knowledge of precautions;Decreased skin integrity;Pain       PT Treatment Interventions DME instruction;Functional mobility training;Therapeutic activities;Therapeutic exercise;Balance training;Patient/family education;Cognitive remediation;Wheelchair mobility training    PT Goals (Current goals can be found in the Care Plan section)  Acute Rehab PT Goals Patient Stated Goal: did not state PT Goal Formulation: Patient unable to participate in goal  setting Time For Goal Achievement: 12/21/21 Potential to Achieve Goals: Fair    Frequency Min 1X/week     Co-evaluation               AM-PAC PT "6 Clicks" Mobility  Outcome Measure Help needed turning from your back to your side while in a flat bed without using bedrails?: A Lot Help needed moving from lying on your back to sitting on the side of a flat bed without using bedrails?: Total Help needed moving to and from a bed to a chair (including a wheelchair)?: Total Help needed standing up from a chair using your arms (e.g., wheelchair or bedside chair)?: Total Help needed to walk in hospital room?: Total Help needed climbing 3-5 steps with a railing? : Total 6 Click Score: 7    End of Session   Activity Tolerance: Patient limited by pain Patient left: in bed;with call bell/phone within reach;with bed alarm set Nurse Communication: Mobility status PT Visit Diagnosis: Muscle weakness (generalized) (M62.81);Other (comment);Other abnormalities of gait and mobility (R26.89)    Time: 9147-8295 PT Time Calculation (min) (ACUTE ONLY): 16 min   Charges:   PT Evaluation $PT Eval Moderate Complexity: 1 Mod          Bryndle Corredor A. Gilford Rile, PT, DPT Acute Rehabilitation Services Pager [phone] Office [phone]  Linna Hoff 12/07/2021, 10:47 AM

## 2021-12-07 NOTE — Evaluation (Signed)
Occupational Therapy Evaluation Patient Details Name: Roger Vazquez MRN: [id number] DOB: [birth year] Today's Date: 12/07/2021   History of Present Illness Roger Vazquez is a 71 y.o. male who presented with wound dehiscence of R BKA performed 10/20/20. Revision to R AKA recommended but pt has been declining procedure. PMH: ESRD on hemodialysis, diabetes mellitus, hypertension, lower extremity DVT, hx of L BKA.   Clinical Impression   Per chart, initially at [facility] for rehab but has since transitioned into LTC. Pt poor historian but reports use of mechanical lift for OOB transfers, unable to describe ADL assist levels. Pt presents now with deficits in cognition, strength, coordination, and pain. Pt requires Max A for rolling in bed, deferred sitting EOB d/t pain and mild agitation with pt confusion. Pt overall Max A for UB ADLs and Total A for LB ADLs bed level. Due to coordination deficits and R UE edema, pt likely requires assist for self feeding. Will follow acutely on a trial basis to address simple ADLs such as self feeding, UE strength and bed mobility. Will also monitor for changes in status pending surgical interventions.      Recommendations for follow up therapy are one component of a multi-disciplinary discharge planning process, led by the attending physician.  Recommendations may be updated based on patient status, additional functional criteria and insurance authorization.   Follow Up Recommendations  Skilled nursing-short term rehab (<3 hours/day)    Assistance Recommended at Discharge Frequent or constant Supervision/Assistance  Patient can return home with the following Two people to help with bathing/dressing/bathroom;Direct supervision/assist for financial management;Two people to help with walking and/or transfers    Functional Status Assessment  Patient has had a recent decline in their functional status and/or demonstrates limited ability to make significant  improvements in function in a reasonable and predictable amount of time  Equipment Recommendations  None recommended by OT    Recommendations for Other Services       Precautions / Restrictions Precautions Precautions: Fall Precaution Comments: hx of L BKA, nonhealing R BKA Restrictions Weight Bearing Restrictions: Yes RLE Weight Bearing: Non weight bearing LLE Weight Bearing: Non weight bearing      Mobility Bed Mobility Overal bed mobility: Needs Assistance Bed Mobility: Rolling Rolling: Max assist         General bed mobility comments: able to assist in rolling by reaching toward bedrail though weak grasp noted.    Transfers                          Balance                                           ADL either performed or assessed with clinical judgement   ADL Overall ADL's : Needs assistance/impaired Eating/Feeding: Moderate assistance;Bed level   Grooming: Moderate assistance;Bed level   Upper Body Bathing: Maximal assistance;Bed level   Lower Body Bathing: Total assistance;Bed level   Upper Body Dressing : Maximal assistance;Bed level   Lower Body Dressing: Total assistance;Bed level       Toileting- Clothing Manipulation and Hygiene: Total assistance;Bed level Toileting - Clothing Manipulation Details (indicate cue type and reason): Total A for bowel incontinent cleanup       General ADL Comments: Requires extensive assist for UB/LB ADLs bed level, likely new baseline since SNF resident. poor coordination in BUE  noted, likely requiring assist for self feeding     Vision Baseline Vision/History: 1 Wears glasses Ability to See in Adequate Light: 1 Impaired Patient Visual Report: No change from baseline Vision Assessment?: No apparent visual deficits     Perception     Praxis      Pertinent Vitals/Pain Pain Assessment Pain Assessment: Faces Faces Pain Scale: Hurts little more Pain Location: R residual limb with  movement Pain Descriptors / Indicators: Discomfort, Moaning Pain Intervention(s): Monitored during session, Limited activity within patient's tolerance     Hand Dominance Right   Extremity/Trunk Assessment Upper Extremity Assessment Upper Extremity Assessment: RUE deficits/detail;LUE deficits/detail RUE Deficits / Details: edema noted around forearm, likely related to HD?Marland Kitchen difficulty holding phone d/t poor coordination RUE Coordination: decreased fine motor;decreased gross motor LUE Coordination: decreased fine motor;decreased gross motor   Lower Extremity Assessment Lower Extremity Assessment: Defer to PT evaluation   Cervical / Trunk Assessment Cervical / Trunk Assessment: Normal   Communication Communication Communication: Expressive difficulties   Cognition Arousal/Alertness: Awake/alert Behavior During Therapy: Flat affect Overall Cognitive Status: No family/caregiver present to determine baseline cognitive functioning                                 General Comments: slow processing, increased time to follow commands, requires repetition. difficulty word finding and expressing thoughts, becomes agitated and deflects his frustration to others     General Comments  Encouraged B knee extension to prevent contractures. noted with small open sore on bottom when completing peri care - RN aware    Exercises     Shoulder Instructions      Home Living Family/patient expects to be discharged to:: Skilled nursing facility                                 Additional Comments: From [facility] as LTC resident per chart      Prior Functioning/Environment Prior Level of Function : Patient poor historian/Family not available             Mobility Comments: Pt reports use of hoyer lift for transfers to wheelchair/dialysis chair ADLs Comments: poor historian, likely assisted bed level for majority of ADLs        OT Problem List: Decreased  strength;Decreased range of motion;Decreased activity tolerance;Impaired balance (sitting and/or standing);Decreased cognition;Decreased safety awareness;Impaired sensation;Impaired tone;Pain      OT Treatment/Interventions: Self-care/ADL training;Therapeutic exercise;Energy conservation;DME and/or AE instruction;Therapeutic activities;Patient/family education;Balance training    OT Goals(Current goals can be found in the care plan section) Acute Rehab OT Goals Patient Stated Goal: pain control OT Goal Formulation: With patient Time For Goal Achievement: 12/21/21 Potential to Achieve Goals: Fair ADL Goals Pt Will Perform Eating: with set-up;bed level;sitting Pt Will Perform Grooming: with set-up;bed level;sitting Pt/caregiver will Perform Home Exercise Program: Increased strength;Both right and left upper extremity;With theraband;With theraputty;With Supervision;With written HEP provided Additional ADL Goal #1: Pt to demo ability to roll side to side with Min A to decrease caregiver burden for LB ADLs  OT Frequency: Min 2X/week    Co-evaluation              AM-PAC OT "6 Clicks" Daily Activity     Outcome Measure Help from another person eating meals?: A Lot Help from another person taking care of personal grooming?: A Lot Help from another person toileting, which includes using  toliet, bedpan, or urinal?: Total Help from another person bathing (including washing, rinsing, drying)?: A Lot Help from another person to put on and taking off regular upper body clothing?: A Lot Help from another person to put on and taking off regular lower body clothing?: Total 6 Click Score: 10   End of Session Nurse Communication: Mobility status;Other (comment) (wound on bottom)  Activity Tolerance: Patient tolerated treatment well Patient left: in bed;with call bell/phone within reach;with bed alarm set  OT Visit Diagnosis: Other abnormalities of gait and mobility (R26.89);Muscle weakness  (generalized) (M62.81);Pain Pain - Right/Left: Right Pain - part of body: Leg                Time: 1505-6979 OT Time Calculation (min): 16 min Charges:  OT General Charges $OT Visit: 1 Visit OT Evaluation $OT Eval Moderate Complexity: 1 Mod  Malachy Chamber, OTR/L Acute Rehab Services Office: [phone]   Layla Maw 12/07/2021, 10:23 AM

## 2021-12-07 NOTE — Plan of Care (Signed)
?  Problem: Clinical Measurements: ?Goal: Ability to avoid or minimize complications of infection will improve ?Outcome: Progressing ?  ?Problem: Skin Integrity: ?Goal: Skin integrity will improve ?Outcome: Progressing ?  ?

## 2021-12-07 NOTE — Progress Notes (Signed)
[facility] PROGRESS NOTE   Roger Vazquez YKZ:[id number] DOB: 20-Jul-1951 DOA: 12/01/2021  6 DOS: the patient was seen and examined on 12/07/2021  PCP: [facility]  Brief History and Hospital Course:  71 y.o. 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  He was transferred to hospital.  Evaluated by vascular surgery.  Was hospitalized for further management.  He was offered above-knee amputation since chances of healing would be higher.  But patient refused and wanted to just do a revision of his BKA.  This was performed on 2/20  Consultants: Vascular surgery.  Nephrology.  Procedures: Revision of right below-knee amputation  Subjective: Patient much more awake today, difficulty orienting patient but alert to person and general situation, confused that he is in the hospital, why he is in the hospital but remarks that he does have ongoing pain in his right lower extremity  Assessment/Plan:  Principal Problem:   Cellulitis of right lower extremity Active Problems:   Type 2 diabetes mellitus with chronic kidney disease on chronic dialysis ([place])   HTN (hypertension)   ESRD on hemodialysis ([place])   Anemia in chronic kidney disease   Acute metabolic encephalopathy   DVT (deep venous thrombosis) (HCC)   Postoperative wound dehiscence   UTI (urinary tract infection)   Protein-calorie malnutrition, severe  Cellulitis of right lower extremity- (present on admission) Vascular surgery following Status post right BKA January - febrile 2/17 with wound dehiscence noted in dialysis CT scan of his right lower extremity which did not show any soft tissue  gas. Patient underwent revision surgery on 2/20.  He refused an above-knee amputation. Which was recommended by vascular given poor wound healing. Remains on vancomycin and cefepime.  Cultures were sent and are pending - initially noted to have few proteus on culture.  UTI (urinary tract infection), POA Apparently patient was complaining of discomfort with urination.  A UA was done which was remarkably abnormal.  Urine culture shows multiple species. Patient is already on antibiotics as above.    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 IV heparin resumed post op Transition back to Eliquis once cleared to do so by vascular surgery.  Acute metabolic encephalopathy- (present on admission) Likely back to his baseline now. Transient mental status changes recently in the setting of post operative narcotics and high risk for sundowning Continue to wean narcotics as tolerated  Anemia in chronic kidney disease- (present on admission) Hemoglobin noted to be low but stable.  Continue to monitor.  ESRD on hemodialysis [facility]) Nephrology is following.  He is dialyzed on Monday Wednesday Friday schedule.  HTN (hypertension)- (present on admission) Blood pressure reasonably well controlled.  Noted to be on amlodipine.    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 Not noted to be on any glucose lowering agents at home.  CBGs are reasonably well controlled.  DVT Prophylaxis: Eliquis on hold perioperatively.  IV heparin ongoing Code Status: Full code Family Communication: Discussed at length with sister over the phone  about dispo planning, possible need for repeat surgery and patient's ongoing confusion. Disposition Plan: Living with roommate prior to last admission, currently residing at SNF  postoperatively likely to return, PT and OT following  Status is: Inpatient Remains inpatient appropriate because: Cellulitis right lower extremity, wound dehiscence  Medications: Scheduled:  acetaminophen  650 mg Oral Q6H   amLODipine  10 mg Oral Daily   calcium acetate  667 mg Oral TID WC   Chlorhexidine Gluconate Cloth  6 each Topical Q0600   enoxaparin (LOVENOX) injection  50 mg Subcutaneous Q24H   feeding supplement (NEPRO CARB STEADY)  237 mL Oral BID BM   insulin aspart  0-6 Units Subcutaneous TID WC   multivitamin  1 tablet Oral QHS   pantoprazole  40 mg Oral BID   Continuous:  sodium chloride     ceFEPime (MAXIPIME) IV 1 g (12/06/21 2124)   PRN:lip balm, oxyCODONE-acetaminophen  Antibiotics: Anti-infectives (From admission, onward)    Start     Dose/Rate Route Frequency Ordered Stop   12/04/21 1200  vancomycin (VANCOREADY) IVPB 500 mg/100 mL  Status:  Discontinued        500 mg 100 mL/hr over 60 Minutes Intravenous Every M-W-F (Hemodialysis) 12/01/21 1803 12/01/21 2039   12/04/21 1200  vancomycin (VANCOREADY) IVPB 500 mg/100 mL        500 mg 100 mL/hr over 60 Minutes Intravenous Every M-W-F (Hemodialysis) 12/01/21 2039 12/06/21 1238   12/03/21 0815  vancomycin (VANCOCIN) IVPB 1000 mg/200 mL premix  Status:  Discontinued        1,000 mg 200 mL/hr over 60 Minutes Intravenous  Once 12/03/21 0726 12/03/21 0733   12/01/21 2200  ceFEPIme (MAXIPIME) 1 g in sodium chloride 0.9 % 100 mL IVPB        1 g 200 mL/hr over 30 Minutes Intravenous Every 24 hours 12/01/21 2037 12/08/21 2159   12/01/21 2030  ceFEPIme (MAXIPIME) 2 g in sodium chloride 0.9 % 100 mL IVPB  Status:  Discontinued        2 g 200 mL/hr over 30 Minutes Intravenous  Once 12/01/21 2026 12/01/21 2037   12/01/21 1715  vancomycin (VANCOCIN) IVPB 1000 mg/200 mL premix  Status:  Discontinued        1,000 mg 200 mL/hr over 60 Minutes Intravenous  Once 12/01/21 1702 12/01/21 1921   12/01/21 1715  cefTRIAXone  (ROCEPHIN) 2 g in sodium chloride 0.9 % 100 mL IVPB        2 g 200 mL/hr over 30 Minutes Intravenous  Once 12/01/21 1702 12/01/21 1925       Objective:  Vital Signs  Vitals:   12/06/21 1634 12/06/21 2131 12/07/21 0500 12/07/21 0548  BP: 136/70 (!) 145/92  116/60  Pulse: 75 78  65  Resp: 18 18  16   Temp: 98.3 F (36.8 C) 97.9 F (36.6 C)  97.9 F (36.6 C)  TempSrc:  Oral    SpO2: 96% 99%  98%  Weight:   54.2 kg   Height:        Intake/Output Summary (Last 24 hours) at 12/07/2021 0713 Last data filed at 12/06/2021 1900 Gross per 24 hour  Intake 120 ml  Output 1000 ml  Net -880 ml    Filed Weights   12/06/21 0835 12/06/21 1208 12/07/21 0500  Weight: 51.3 kg 50 kg 54.2 kg    General appearance: Awake alert.  Oriented to person and general situation only Resp: Clear to auscultation bilaterally.  Normal effort Cardio: S1-S2  is normal regular.  No S3-S4.  No rubs murmurs or bruit GI: Abdomen is soft.  Nontender nondistended.  Bowel sounds are present normal.  No masses organomegaly Extremities: Bilateral amputee Neurologic:  No focal neurological deficits.    Lab Results:  Data Reviewed: I have personally reviewed labs and imaging study reports  CBC: Recent Labs  Lab 12/01/21 1815 12/02/21 0425 12/04/21 0144 12/04/21 0735 12/05/21 0348 12/06/21 0413 12/07/21 0443  WBC 11.0*   < > 9.1 8.7 8.9 7.4 6.3  NEUTROABS 9.0*  --   --   --   --   --   --   HGB 11.2*   < > 9.5* 10.0* 9.2* 10.2* 10.0*  HCT 34.4*   < > 28.6* 31.0* 27.9* 31.1* 30.7*  MCV 94.0   < > 90.5 91.4 90.6 89.9 90.6  PLT 177   < > 162 158 164 201 152   < > = values in this interval not displayed.     Basic Metabolic Panel: Recent Labs  Lab 12/01/21 1815 12/02/21 0425 12/03/21 0332 12/04/21 0144 12/06/21 0413  NA 136 138 132* 129* 130*  K 3.0* 3.3* 4.1 3.6 3.4*  CL 94* 97* 93* 91* 91*  CO2 26 24 20* 22 24  GLUCOSE 147* 105* 172* 128* 121*  BUN 26* 34* 50* 59* 41*  CREATININE 5.83*  6.63* 8.09* 8.91* 6.61*  CALCIUM 8.3* 8.3* 8.3* 8.2* 9.0     GFR: Estimated Creatinine Clearance: 7.7 mL/min (A) (by C-G formula based on SCr of 6.61 mg/dL (H)).  Liver Function Tests: Recent Labs  Lab 12/01/21 1815 12/02/21 0425  AST 22 19  ALT 12 9  ALKPHOS 89 76  BILITOT 0.7 0.3  PROT 8.6* 7.4  ALBUMIN 3.2* 2.7*      Recent Labs  Lab 12/01/21 1815  AMMONIA 17     Coagulation Profile: Recent Labs  Lab 12/01/21 1815  INR 1.3*       CBG: Recent Labs  Lab 12/05/21 2054 12/06/21 0719 12/06/21 1253 12/06/21 1630 12/06/21 2129  GLUCAP 150* 116* 148* 128* 102*      Recent Results (from the past 240 hour(s))  Blood Culture (routine x 2)     Status: None   Collection Time: 12/01/21  6:15 PM   Specimen: BLOOD  Result Value Ref Range Status   Specimen Description BLOOD SITE NOT SPECIFIED  Final   Special Requests   Final    BOTTLES DRAWN AEROBIC AND ANAEROBIC Blood Culture adequate volume   Culture   Final    NO GROWTH 5 DAYS Performed at [facility], [place] [address] [place], [place] [address]    Report Status 12/06/2021 FINAL  Final  Resp Panel by RT-PCR (Flu A&B, Covid) Nasopharyngeal Swab     Status: None   Collection Time: 12/02/21  3:30 AM   Specimen: Nasopharyngeal Swab; Nasopharyngeal(NP) swabs in vial transport medium  Result Value Ref Range Status   SARS Coronavirus 2 by RT PCR NEGATIVE NEGATIVE Final    Comment: (NOTE) SARS-CoV-2 target nucleic acids are NOT DETECTED.  The SARS-CoV-2 RNA is generally detectable in upper respiratory specimens during the acute phase of infection. The lowest concentration of SARS-CoV-2 viral copies this assay can detect is 138 copies/mL. A negative result does not preclude SARS-Cov-2 infection and should not be used as the sole basis for treatment or other patient management decisions. A negative result may occur with  improper specimen collection/handling, submission of specimen other than  nasopharyngeal swab, presence of viral mutation(s) within the areas targeted by this assay, and inadequate number of viral copies(<138 copies/mL). A negative result must be combined with clinical observations, patient history, and epidemiological information. The expected result is Negative.  Fact Sheet for Patients:  EntrepreneurPulse.com.au  Fact Sheet for Healthcare Providers:  IncredibleEmployment.be  This test is no t yet approved or cleared by the [place] FDA and  has been authorized for detection and/or diagnosis of SARS-CoV-2 by FDA under an Emergency Use Authorization (EUA). This EUA will remain  in effect (meaning this test can be used) for the duration of the COVID-19 declaration under Section 564(b)(1) of the Act, 21 U.S.C.section 360bbb-3(b)(1), unless the authorization is terminated  or revoked sooner.       Influenza A by PCR NEGATIVE NEGATIVE Final   Influenza B by PCR NEGATIVE NEGATIVE Final    Comment: (NOTE) The Xpert Xpress SARS-CoV-2/FLU/RSV plus assay is intended as an aid in the diagnosis of influenza from Nasopharyngeal swab specimens and should not be used as a sole basis for treatment. Nasal washings and aspirates are unacceptable for Xpert Xpress SARS-CoV-2/FLU/RSV testing.  Fact Sheet for Patients: EntrepreneurPulse.com.au  Fact Sheet for Healthcare Providers: IncredibleEmployment.be  This test is not yet approved or cleared by the [place] FDA and has been authorized for detection and/or diagnosis of SARS-CoV-2 by FDA under an Emergency Use Authorization (EUA). This EUA will remain in effect (meaning this test can be used) for the duration of the COVID-19 declaration under Section 564(b)(1) of the Act, 21 U.S.C. section 360bbb-3(b)(1), unless the authorization is terminated or revoked.  Performed at [facility], [place] [address], [place], [place] [address]    Blood Culture (routine x 2)     Status: None (Preliminary result)   Collection Time: 12/02/21  4:25 AM   Specimen: BLOOD LEFT HAND  Result Value Ref Range Status   Specimen Description BLOOD LEFT HAND  Final   Special Requests AEROBIC BOTTLE ONLY Blood Culture adequate volume  Final   Culture   Final    NO GROWTH 4 DAYS Performed at [facility], [place] [address], [place], [place] [address]    Report Status PENDING  Incomplete  Urine Culture     Status: Abnormal   Collection Time: 12/03/21  6:08 AM   Specimen: In/Out Cath Urine  Result Value Ref Range Status   Specimen Description IN/OUT CATH URINE  Final   Special Requests   Final    NONE Performed at [facility], [place] [address] [place], [place] [address]    Culture MULTIPLE SPECIES PRESENT, SUGGEST RECOLLECTION (A)  Final   Report Status 12/04/2021 FINAL  Final  Surgical PCR screen     Status: None   Collection Time: 12/04/21  5:01 AM   Specimen: Nasal Mucosa; Nasal Swab  Result Value Ref Range Status   MRSA, PCR NEGATIVE NEGATIVE Final   Staphylococcus aureus NEGATIVE NEGATIVE Final    Comment: (NOTE) The Xpert SA Assay (FDA approved for NASAL specimens in patients 3 years of age and older), is one component of a comprehensive surveillance program. It is not intended to diagnose infection nor to guide or monitor treatment. Performed at [facility], [place] [address] [place], [place] [address]   Aerobic/Anaerobic Culture w Gram Stain (surgical/deep wound)     Status: None (Preliminary result)   Collection Time: 12/04/21 10:19 AM   Specimen: Wound  Result Value Ref Range Status  Specimen Description WOUND  Final   Special Requests RIGHT STUMP SPEC A  Final   Gram Stain   Final    FEW WBC PRESENT, PREDOMINANTLY MONONUCLEAR RARE GRAM POSITIVE COCCI IN PAIRS Performed at [facility], [place] [address] [place], [place] [address]    Culture   Final    FEW PROTEUS MIRABILIS CULTURE REINCUBATED FOR  BETTER GROWTH NO ANAEROBES ISOLATED; CULTURE IN PROGRESS FOR 5 DAYS    Report Status PENDING  Incomplete   Organism ID, Bacteria PROTEUS MIRABILIS  Final      Susceptibility   Proteus mirabilis - MIC*    AMPICILLIN <=2 SENSITIVE Sensitive     CEFAZOLIN <=4 SENSITIVE Sensitive     CEFEPIME <=0.12 SENSITIVE Sensitive     CEFTAZIDIME <=1 SENSITIVE Sensitive     CEFTRIAXONE <=0.25 SENSITIVE Sensitive     CIPROFLOXACIN <=0.25 SENSITIVE Sensitive     GENTAMICIN <=1 SENSITIVE Sensitive     IMIPENEM 2 SENSITIVE Sensitive     TRIMETH/SULFA <=20 SENSITIVE Sensitive     AMPICILLIN/SULBACTAM <=2 SENSITIVE Sensitive     PIP/TAZO <=4 SENSITIVE Sensitive     * FEW PROTEUS MIRABILIS       Radiology Studies: No results found.   LOS: 6 days   Little Ishikawa  [facility] Pager on [email or web address]  12/07/2021, 7:13 AM

## 2021-12-08 ENCOUNTER — Inpatient Hospital Stay (HOSPITAL_COMMUNITY): Payer: No Typology Code available for payment source

## 2021-12-08 DIAGNOSIS — Z7189 Other specified counseling: Secondary | ICD-10-CM

## 2021-12-08 DIAGNOSIS — Z515 Encounter for palliative care: Secondary | ICD-10-CM | POA: Diagnosis not present

## 2021-12-08 DIAGNOSIS — L03115 Cellulitis of right lower limb: Secondary | ICD-10-CM | POA: Diagnosis not present

## 2021-12-08 DIAGNOSIS — Z66 Do not resuscitate: Secondary | ICD-10-CM | POA: Diagnosis not present

## 2021-12-08 HISTORY — PX: IR REMOVAL TUN CV CATH W/O FL: IMG2289

## 2021-12-08 LAB — BPAM RBC
Blood Product Expiration Date: 202303152359
Blood Product Expiration Date: 202303282359
Unit Type and Rh: 9500
Unit Type and Rh: 9500

## 2021-12-08 LAB — CBC WITH DIFFERENTIAL/PLATELET
Abs Immature Granulocytes: 0 10*3/uL (ref 0.00–0.07)
Basophils Absolute: 0.1 10*3/uL (ref 0.0–0.1)
Basophils Relative: 1 %
Eosinophils Absolute: 0.7 10*3/uL — ABNORMAL HIGH (ref 0.0–0.5)
Eosinophils Relative: 10 %
HCT: 32.1 % — ABNORMAL LOW (ref 39.0–52.0)
Hemoglobin: 10.4 g/dL — ABNORMAL LOW (ref 13.0–17.0)
Lymphocytes Relative: 16 %
Lymphs Abs: 1.2 10*3/uL (ref 0.7–4.0)
MCH: 29.6 pg (ref 26.0–34.0)
MCHC: 32.4 g/dL (ref 30.0–36.0)
MCV: 91.5 fL (ref 80.0–100.0)
Monocytes Absolute: 0.2 10*3/uL (ref 0.1–1.0)
Monocytes Relative: 3 %
Neutro Abs: 5.1 10*3/uL (ref 1.7–7.7)
Neutrophils Relative %: 70 %
Platelets: 208 10*3/uL (ref 150–400)
RBC: 3.51 MIL/uL — ABNORMAL LOW (ref 4.22–5.81)
RDW: 18.7 % — ABNORMAL HIGH (ref 11.5–15.5)
WBC: 7.3 10*3/uL (ref 4.0–10.5)
nRBC: 0 % (ref 0.0–0.2)
nRBC: 0 /100 WBC

## 2021-12-08 LAB — RENAL FUNCTION PANEL
Albumin: 2.3 g/dL — ABNORMAL LOW (ref 3.5–5.0)
Anion gap: 16 — ABNORMAL HIGH (ref 5–15)
BUN: 34 mg/dL — ABNORMAL HIGH (ref 8–23)
CO2: 20 mmol/L — ABNORMAL LOW (ref 22–32)
Calcium: 9.1 mg/dL (ref 8.9–10.3)
Chloride: 95 mmol/L — ABNORMAL LOW (ref 98–111)
Creatinine, Ser: 6 mg/dL — ABNORMAL HIGH (ref 0.61–1.24)
GFR, Estimated: 9 mL/min — ABNORMAL LOW (ref >60)
Glucose, Bld: 74 mg/dL (ref 70–99)
Phosphorus: 4.7 mg/dL — ABNORMAL HIGH (ref 2.5–4.6)
Potassium: 3.7 mmol/L (ref 3.5–5.1)
Sodium: 131 mmol/L — ABNORMAL LOW (ref 135–145)

## 2021-12-08 LAB — TYPE AND SCREEN
ABO/RH(D): O POS
Antibody Screen: POSITIVE
DAT, IgG: NEGATIVE
Donor AG Type: NEGATIVE
Donor AG Type: NEGATIVE
Unit division: 0
Unit division: 0

## 2021-12-08 LAB — GLUCOSE, CAPILLARY: Glucose-Capillary: 95 mg/dL (ref 70–99)

## 2021-12-08 MED ORDER — GLYCOPYRROLATE 0.2 MG/ML IJ SOLN: 0.2000 mg | INTRAMUSCULAR | Status: DC | PRN

## 2021-12-08 MED ORDER — LIDOCAINE HCL 1 % IJ SOLN
INTRAMUSCULAR | Status: AC
Start: 2021-12-08 — End: 2021-12-09
  Filled 2021-12-08: qty 20

## 2021-12-08 MED ORDER — DARBEPOETIN ALFA 100 MCG/0.5ML IJ SOSY: 100.0000 ug | PREFILLED_SYRINGE | INTRAMUSCULAR | Status: DC

## 2021-12-08 MED ORDER — POLYVINYL ALCOHOL 1.4 % OP SOLN: 1.0000 [drp] | Freq: Four times a day (QID) | OPHTHALMIC | Status: DC | PRN

## 2021-12-08 MED ORDER — LORAZEPAM 2 MG/ML IJ SOLN: 0.5000 mg | INTRAMUSCULAR | Status: DC | PRN

## 2021-12-08 MED ORDER — HALOPERIDOL 0.5 MG PO TABS
0.5000 mg | ORAL_TABLET | ORAL | Status: DC | PRN
  Filled 2021-12-08: qty 1

## 2021-12-08 MED ORDER — BIOTENE DRY MOUTH MT LIQD: 15.0000 mL | OROMUCOSAL | Status: DC | PRN

## 2021-12-08 MED ORDER — HYDROMORPHONE HCL 1 MG/ML IJ SOLN
1.0000 mg | INTRAMUSCULAR | Status: DC | PRN
  Administered 2021-12-08 – 2021-12-11 (×3): 1 mg via INTRAVENOUS
  Filled 2021-12-08 (×3): qty 1

## 2021-12-08 MED ORDER — HALOPERIDOL LACTATE 5 MG/ML IJ SOLN
0.5000 mg | INTRAMUSCULAR | Status: DC | PRN
  Administered 2021-12-11: 0.5 mg via INTRAVENOUS
  Filled 2021-12-08: qty 1

## 2021-12-08 MED ORDER — ONDANSETRON HCL 4 MG/2ML IJ SOLN: 4.0000 mg | Freq: Four times a day (QID) | INTRAMUSCULAR | Status: DC | PRN

## 2021-12-08 MED ORDER — ONDANSETRON 4 MG PO TBDP
4.0000 mg | ORAL_TABLET | Freq: Four times a day (QID) | ORAL | Status: DC | PRN
Start: 2021-12-08 — End: 2021-12-11

## 2021-12-08 MED ORDER — HYDROMORPHONE HCL 1 MG/ML IJ SOLN
1.0000 mg | Freq: Four times a day (QID) | INTRAMUSCULAR | Status: DC
  Administered 2021-12-08 – 2021-12-09 (×3): 1 mg via INTRAVENOUS
  Filled 2021-12-08 (×3): qty 1

## 2021-12-08 MED ORDER — GLYCOPYRROLATE 1 MG PO TABS
1.0000 mg | ORAL_TABLET | ORAL | Status: DC | PRN
  Filled 2021-12-08: qty 1

## 2021-12-08 MED ORDER — HALOPERIDOL LACTATE 2 MG/ML PO CONC
0.5000 mg | ORAL | Status: DC | PRN
  Filled 2021-12-08: qty 0.3

## 2021-12-08 NOTE — Consult Note (Signed)
Palliative Medicine Inpatient Consult Note  Consulting Provider: Little Ishikawa, MD  Reason for consult:   [facility] Palliative Medicine Consult  Reason for Consult? Told family today "i think i'm done" - needs further amputation of limb and refusing. Discussed transitioning to palliative/hospice (already at SNF) given his situation   HPI:  Per intake H&P --> Roger Vazquez is a 71 y.o. male who presented with wound dehiscence of R BKA performed 10/20/20. Revision to R AKA recommended but pt has been declining procedure. PMH: ESRD on hemodialysis, diabetes mellitus, hypertension, lower extremity DVT, hx of L BKA.  Palliative care has been asked to discuss goals of care in the setting of patient's desire to stop aggressive modalities of life supportive interventions.  Clinical Assessment/Goals of Care:  *Please note that this is a verbal dictation therefore any spelling or grammatical errors are due to the "[place] [facility]" system interpretation.  I have reviewed medical records including EPIC notes, labs and imaging, received report from bedside RN, assessed the patient.    I met with [name] to further discuss diagnosis prognosis, GOC, EOL wishes, disposition and options.   I introduced Palliative Medicine as specialized medical care for people living with serious illness. It focuses on providing relief from the symptoms and stress of a serious illness. The goal is to improve quality of life for both the patient and the family.  Medical History Review and Understanding:  [name] and I reviewed his history of end-stage renal disease requiring dialysis 3 times weekly, diabetes mellitus, hypertension, bilateral below the knee amputations requiring revision in the right.  Reviewed the difficulties associated with these processes and how it has impacted [place] life.  Social History:  Roger Vazquez was living in [place], [place] with a roommate  prior to his right below the knee amputation and since then he has been at [facility] for rehabilitation.  He has never been married he has no children.  He has 3 sisters which live locally [place], [place], and Roger Vazquez.  He shares that he worked at a [employer], contracting formally.  He is a man of faith and practices within the [employer] denomination.  Functional and Nutritional State:  [name] was at [facility] prior to admission reliant upon caregivers for basic activities of daily living in the setting of bilateral below the knee amputations.  Appetite has been variable.  Advance Directives: A detailed discussion was had today regarding advanced directives.  Patient has no advanced directives on file though he does share he would wish for sister [name] to be his surrogate decision maker if it came down to that.  Code Status:  Encouraged patient/family to consider DNR/DNI status understanding evidenced based poor outcomes in similar hospitalized patient, as the cause of arrest is likely associated with advanced chronic/terminal illness rather than an easily reversible acute cardio-pulmonary event. I explained that DNR/DNI does not change the medical plan and it only comes into effect after a person has arrested (died).  It is a protective measure to keep [place] from harming the patient in their last moments of life. [name] was agreeable to DNR/DNI with understanding that patient would not receive CPR, defibrillation, ACLS medications, or intubation.    The difference between a aggressive medical intervention path  and a palliative comfort care path for this patient at this time was had.   Discussed that there from paths we could travel either to continue present aggressive interventions or to transition her focus towards more of a  comfort emphasis.  I shared that this would mean stopping hemodialysis and just providing measures for heart rate to be as comfortable as possible as he transitions from this life  to the next.  This was repeated multiple times and Watt consistently shared that he would want to be comfortable and he does want to stop dialysis.  We reviewed the importance of speaking to his sisters about this decision.  Goals for the Future:  To be as comfortable as possible for as long as possible. Accepts that life will be limited to < 2 weeks after stopping HD.  Discussed the importance of continued conversation with family and their  medical providers regarding overall plan of care and treatment options, ensuring decisions are within the context of the patients values and GOCs. _______________________________ Addendum:  I spoke to patient's sister Roger Vazquez we reviewed the above conversation and she was very much in favor of him being made comfortable realizing that stopping hemodialysis will provide him with a very limited life span.  She shares that from her perspective he has made multiple comments about being close to end-of-life and realizes his time is coming. ________________________________ Addendum #2:  I spoke to patient's other sister, Roger Vazquez about the above conversation.  She was very tearful but shares that Roger Vazquez has suffered for a long time and she believes that allowing him to be at peace and die naturally would preserve his dignity.  She to expresses being in favor of a comfort oriented modality of care. _________________________________ Addendum #3  I was able to speak to patient's third sister Roger Vazquez this afternoon.  Reviewed the plan for comfort focused care which Roger Vazquez is complete agreement with.  All three sisters have been contacted the primary and consulting medical services have been informed of the transition to comfort oriented care.  Transitions of care team have been informed for transfer to an inpatient hospice home.  Decision Maker: Roger Vazquez (sister) [phone]  SUMMARY OF RECOMMENDATIONS   DNAR/DNI  Comfort focused care  Comfort medications  per [facility] --> we will add Dilaudid 1 mg IV push every 6 hours around-the-clock to support ongoing pain of right stump  Unrestricted visitation  Appreciate chaplain involvement  Ongoing palliative care support  Code Status/Advance Care Planning: DNAR/DNI  Palliative Prophylaxis:  Aspiration, Bowel Regimen, Delirium Protocol, Frequent Pain Assessment, Oral Care, Palliative Wound Care, and Turn Reposition  Additional Recommendations (Limitations, Scope, Preferences): Comfort focused care  Psycho-social/Spiritual:  Desire for further Chaplaincy support: Yes - [employer] Additional Recommendations: Education on end of life   Prognosis: Stopping HD would leave < 2 weeks prognosis  Discharge Planning: Discharge to [facility] home.  Vitals:   12/07/21 2103 12/08/21 0457  BP: 134/64 126/63  Pulse: 70 68  Resp: 18 16  Temp: 98.4 F (36.9 C) 98.5 F (36.9 C)  SpO2: 100% 100%    Intake/Output Summary (Last 24 hours) at 12/08/2021 0703 Last data filed at 12/08/2021 0600 Gross per 24 hour  Intake 440 ml  Output 0 ml  Net 440 ml   Last Weight  Most recent update: 12/07/2021  5:44 AM    Weight  54.2 kg (119 lb 7.8 oz)            Gen:  Elderly AA M in NAD HEENT: moist mucous membranes CV: Regular rate and rhythm  PULM: On RA breathing is even and nonlabored ABD: soft/nontender EXT: Left BKA, right BKA with wrapping in place Neuro: Alert and oriented x3  PPS: 10-20%  This conversation/these recommendations were discussed with patient primary care team, Dr. Avon Gully  Total Time: 105 minutes  MDM High  Medical Decision Making: 4 #/Complex Problems: 4                     Data Reviewed:  4               Management: 4 (1-Straightforward, 2-Low, 3-Moderate, 4-High) ______________________________________________________ [facility] Team Team Cell Phone: [phone] Please utilize secure chat with additional questions, if there is no  response within 30 minutes please call the above phone number  Palliative Medicine Team providers are available by phone from 7am to 7pm daily and can be reached through the team cell phone.  Should this patient require assistance outside of these hours, please call the patient's attending physician.

## 2021-12-08 NOTE — Progress Notes (Signed)
Manufacturing engineer [facility]) Hospital Liaison note.      Received request from [place] for family interest in [facility]. Hospital Liaison will follow up tomorrow or sooner if a room becomes available and eligibility is confirmed.    A Please do not hesitate to call with questions.     Thank you,    Farrel Gordon, RN, [place] (listed on [facility] under [place])     [phone]

## 2021-12-08 NOTE — Progress Notes (Signed)
Patient is now on comfort measures.  Pain medication given as ordered.  Patient sister at bedside.  Waiting for bed assignment.

## 2021-12-08 NOTE — TOC Progression Note (Signed)
Transition of Care [facility]) - Initial/Assessment Note    Patient Details  Name: Roger Vazquez MRN: [id number] Date of Birth: December 24, 1950  Transition of Care [facility]) CM/SW Contact:    Milinda Antis, [place] Phone Number: 12/08/2021, 4:03 PM  Clinical Narrative:                 Family and patient agreed to comfort care.  CSW met with family and patient at bedside who requested [facility].  CSW messaged [facility], Farrel Gordon, and requested that they review the patient.         Patient Goals and CMS Choice        Expected Discharge Plan and Services                                                Prior Living Arrangements/Services                       Activities of Daily Living Home Assistive Devices/Equipment: Wheelchair ADL Screening (condition at time of admission) Patient's cognitive ability adequate to safely complete daily activities?: No Is the patient deaf or have difficulty hearing?: No Does the patient have difficulty seeing, even when wearing glasses/contacts?: Yes Does the patient have difficulty concentrating, remembering, or making decisions?: Yes Patient able to express need for assistance with ADLs?: Yes Does the patient have difficulty dressing or bathing?: Yes Independently performs ADLs?: No Communication: Independent Dressing (OT): Needs assistance Is this a change from baseline?: Pre-admission baseline Grooming: Independent Feeding: Independent Bathing: Independent with device (comment) Toileting: Needs assistance Is this a change from baseline?: Pre-admission baseline In/Out Bed: Needs assistance Is this a change from baseline?: Pre-admission baseline Walks in Home: Dependent Is this a change from baseline?: Pre-admission baseline Does the patient have difficulty walking or climbing stairs?: Yes Weakness of Legs: None Weakness of Arms/Hands: Both  Permission Sought/Granted                  Emotional  Assessment              Admission diagnosis:  Cellulitis [L03.90] Infection of amputation stump of right lower extremity ([place]) [T87.43] Patient Active Problem List   Diagnosis Date Noted   Protein-calorie malnutrition, severe 12/04/2021   UTI (urinary tract infection) 12/03/2021   Cellulitis of right lower extremity 12/02/2021   Postoperative wound dehiscence 12/02/2021   Diverticulosis of colon without hemorrhage    Acute gastric ulcer without hemorrhage or perforation    Acute osteomyelitis of right calcaneus (HCC)    Chronic anticoagulation    Heel ulcer, right, with unspecified severity ([place]) 10/13/2021   DVT (deep venous thrombosis) ([place]) 10/13/2021   Lymphadenopathy 10/13/2021   COVID-19 83/41/9622   Acute metabolic encephalopathy 29/79/8921   COVID-19 virus infection 08/12/2021   Peripheral vascular disease ([place]) 06/09/2021   S/P BKA (below knee amputation), left ([place]) 06/08/2021   Status post amputation of foot through metatarsal bone ([place])    Altered mental status 05/19/2021   Left foot infection    Alcohol abuse 05/16/2021   Erectile dysfunction 05/16/2021   Hydronephrosis 05/16/2021   Acute hepatitis C 05/16/2021   Diabetic retinopathy ([place]) 05/16/2021   History of hemodialysis 05/16/2021   Iron deficiency anemia 05/16/2021   Malignant neoplasm of upper lobe, left bronchus or lung ([place]) 05/16/2021   Noncompliance with  medication regimen 05/16/2021   Non-small cell lung cancer ([place]) 05/16/2021   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   Vitreous hemorrhage, left eye ([place]) 05/16/2021   Cellulitis of left lower extremity    Gangrene ([place]) 04/27/2021   Diarrhea, unspecified 04/29/2019   Mild protein-calorie malnutrition ([place]) 08/20/2018   Fever    Infection of AV graft for dialysis ([place]) 08/05/2018   Symptomatic anemia 08/05/2018   Hypokalemia 08/05/2018   GERD (gastroesophageal reflux disease) 08/05/2018   Sepsis due to unspecified Staphylococcus ([place])  08/02/2018   ESRD on hemodialysis ([place])    Bacteremia due to methicillin susceptible Staphylococcus aureus (MSSA) 07/28/2018   Sepsis ([place]) 07/27/2018   Headache 01/14/2017   Fluid overload, unspecified 12/13/2015   Anemia in chronic kidney disease 12/09/2015   Chronic kidney disease, stage 5 ([place]) 12/09/2015   Coagulation defect, unspecified ([place]) 12/09/2015   Encounter for adjustment and management of vascular access device 12/09/2015   Other secondary pulmonary hypertension ([place]) 12/09/2015   Pruritus, unspecified 12/09/2015   Secondary hyperparathyroidism of renal origin ([place]) 12/09/2015   Shortness of breath 12/09/2015   Abdominal pain, other specified site 10/07/2011   Nausea and vomiting 10/07/2011   VITAMIN D DEFICIENCY 03/19/2008   FRACTURE, TIBIA 02/09/2008   HTN (hypertension) 11/25/2007   HEPATITIS C 05/27/2007   Type 2 diabetes mellitus with chronic kidney disease on chronic dialysis ([place]) 05/27/2007   ERECTILE DYSFUNCTION 05/27/2007   ABUSE, ALCOHOL, IN REMISSION 05/27/2007   ABUSE, OPIOID, IN REMISSION 05/27/2007   ABUSE, COCAINE, IN REMISSION 05/27/2007   PCP:  Clinic, [place]:   [facility] [place], [place] [address] [place] [address] Phone: [phone] Fax: [phone]     Social Determinants of Health (SDOH) Interventions    Readmission Risk Interventions Readmission Risk Prevention Plan 05/30/2021  Transportation Screening Complete  SW Recovery Care/Counseling Consult Complete  Skilled Nursing Facility Complete  Some recent data might be hidden

## 2021-12-08 NOTE — Progress Notes (Signed)
[facility] Progress Note   Subjective:   Patient seen and examined at bedside in dialysis.  No issues with cannulation, tolerating dialysis well so far.  Denies CP, SOB, abdominal pain and n/v.  Admits to diarrhea and pain in his leg.    Objective Vitals:   12/08/21 0800 12/08/21 0830 12/08/21 0900 12/08/21 0930  BP: (!) 110/58 99/62 92/60  (!) 99/50  Pulse:  73 71 62  Resp: 14 10  13   Temp:      TempSrc:      SpO2:      Weight:      Height:       Physical Exam General:elderly male in NAD Heart:RRR, no mrg Lungs:mostly CTAB, +rhonchi on L Abdomen:soft, NTND Extremities:no LE edema, B BKA, RUE non pitting edema Dialysis Access: TDC in L chest, R forearm loop AVG in use   Filed Weights   12/06/21 1208 12/07/21 0500 12/08/21 0730  Weight: 50 kg 54.2 kg 48.4 kg    Intake/Output Summary (Last 24 hours) at 12/08/2021 1020 Last data filed at 12/08/2021 0600 Gross per 24 hour  Intake 320 ml  Output 0 ml  Net 320 ml    Additional Objective Labs: Basic Metabolic Panel: Recent Labs  Lab 12/04/21 0144 12/06/21 0413 12/08/21 0807  NA 129* 130* 131*  K 3.6 3.4* 3.7  CL 91* 91* 95*  CO2 22 24 20*  GLUCOSE 128* 121* 74  BUN 59* 41* 34*  CREATININE 8.91* 6.61* 6.00*  CALCIUM 8.2* 9.0 9.1  PHOS  --   --  4.7*   Liver Function Tests: Recent Labs  Lab 12/01/21 1815 12/02/21 0425 12/08/21 0807  AST 22 19  --   ALT 12 9  --   ALKPHOS 89 76  --   BILITOT 0.7 0.3  --   PROT 8.6* 7.4  --   ALBUMIN 3.2* 2.7* 2.3*   CBC: Recent Labs  Lab 12/01/21 1815 12/02/21 0425 12/04/21 0735 12/05/21 0348 12/06/21 0413 12/07/21 0443 12/08/21 0806  WBC 11.0*   < > 8.7 8.9 7.4 6.3 7.3  NEUTROABS 9.0*  --   --   --   --   --  5.1  HGB 11.2*   < > 10.0* 9.2* 10.2* 10.0* 10.4*  HCT 34.4*   < > 31.0* 27.9* 31.1* 30.7* 32.1*  MCV 94.0   < > 91.4 90.6 89.9 90.6 91.5  PLT 177   < > 158 164 201 152 208   < > = values in this interval not displayed.    Recent Labs   Lab 12/06/21 2129 12/07/21 0737 12/07/21 1121 12/07/21 1647 12/07/21 2055  GLUCAP 102* 77 77 92 85    Medications:  sodium chloride      acetaminophen  650 mg Oral Q6H   amLODipine  10 mg Oral Daily   calcium acetate  667 mg Oral TID WC   Chlorhexidine Gluconate Cloth  6 each Topical Q0600   enoxaparin (LOVENOX) injection  50 mg Subcutaneous Q24H   feeding supplement (NEPRO CARB STEADY)  237 mL Oral BID BM   insulin aspart  0-6 Units Subcutaneous TID WC   multivitamin  1 tablet Oral QHS   pantoprazole  40 mg Oral BID    Dialysis Orders: [facility]  on MWF schedule 3:15 hr, 400/1.5, EDW 49.5 (kg), 2K/2Ca bath - Mircera 225 mcg IV q 2 weeks - last dose 11/24/21 - No VDRA   Assessment/Plan:  Right stump infection: On IV Vanc/Cefepime. S/p  stump revision 2/20, was left open and will need conversion to AKA in near future. Refusing further amputation at this time.   ESRD:  Continue HD per usual MWF schedule - HD today. Able to use R AVG without issues last HD. Successful use of AVG again today, will request TDC removal by IR as long as no issues throughout treatment.  Hypertension/volume: BP fine. Last CXR clear. Does not appear volume overloaded.  UF as tolerated.   Anemia: Hgb 10.4. On max ESA outpatient.  Hgb stable, will order with next HD.    Metabolic bone disease: CorrCa slightly high, phos in goal - not on VDRA. Continue calcium acetate.  Nutrition:  Renal diet with fluid restrictions. Albumin is low, continue protein supplementation.  Chronic L DVT: On Eliquis as outpatient. Currently on hold  [employer], PA-C [place] 12/08/2021,10:20 AM  LOS: 7 days

## 2021-12-08 NOTE — Progress Notes (Signed)
[facility] PROGRESS NOTE   Roger Vazquez CVE:[id number] DOB: 16-Oct-1950 DOA: 12/01/2021  7 DOS: the patient was seen and examined on 12/08/2021  PCP: [facility], [facility] [facility]  Brief History and Hospital Course:  71 y.o. 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  He was transferred to hospital.  Evaluated by vascular surgery.  Was hospitalized for further management.  He was offered above-knee amputation since chances of healing would be higher.  But patient refused and wanted to just do a revision of his BKA.  This was performed on 2/20.  Ongoing conversation with vascular surgery and family members about patient's prognosis.  He continues to refuse surgical advice recommending AKA given poor wound healing and increased risk for infection.  Patient indicates he is "done" as below, palliative care consulted for further discussion with patient and family about goals of care and potential transition to palliative care versus hospice.  Consultants: Vascular surgery.  Nephrology.  Procedures: Revision of right below-knee amputation  Subjective: Patient tolerating dialysis well, still complaining of right lower extremity pain, oriented to person place and situation -denies nausea vomiting diarrhea constipation headache fevers chills or chest pain  Assessment/Plan:  Principal Problem:   Cellulitis of right lower extremity Active Problems:   Type 2 diabetes mellitus with chronic kidney disease on chronic dialysis (HCC)   HTN (hypertension)   ESRD on hemodialysis (HCC)   Anemia in chronic kidney disease   Acute metabolic encephalopathy   DVT (deep venous  thrombosis) (HCC)   Postoperative wound dehiscence   UTI (urinary tract infection)   Protein-calorie malnutrition, severe  Goals of Care: Ongoing discussion with palliative care and family today.  Appreciate insight and recommendations. Need to ensure patient and family fully understand what refusal of surgery, or stopping dialysis would mean.  Cellulitis of right lower extremity- (present on admission) Vascular surgery following Status post right BKA January - febrile 2/17 with wound dehiscence noted in dialysis CT scan of his right lower extremity which did not show any soft tissue gas. Patient underwent revision surgery on 2/20.  He refused an above-knee amputation. Which was recommended by vascular given poor wound healing. Remains on vancomycin and cefepime.  Cultures were sent and are pending - initially noted to have few proteus on culture.  UTI (urinary tract infection), POA Apparently patient was complaining of discomfort with urination.  A UA was done which was remarkably abnormal.  Urine culture shows multiple species. Patient is already on antibiotics as above.    DVT (deep venous thrombosis) ([place])- (present on admission) Diagnosed October 2022 - on Eliquis outpatient  Doppler study from January 2023 showed chronic DVT in the [facility] junction on the left. IV heparin resumed post op Transition back to Eliquis once cleared to do so by vascular surgery.  Acute metabolic encephalopathy- (present on admission) Likely back to his baseline now -waxing and waning mental status surrounding narcotics Transient mental status changes recently in the setting of post operative narcotics  Remains high risk for sundowning Continue to wean narcotics as tolerated -pain moderately well controlled on Percocet 5 every 6h -no longer on IV narcotics  Chronic anemia in chronic kidney disease - Hemoglobin noted to be  low but stable.  Continue to monitor.  ESRD on hemodialysis [facility]) - Nephrology is  following.  He is dialyzed on Monday Wednesday Friday schedule.  HTN (hypertension) - Blood pressure reasonably well controlled.  Noted to be on amlodipine.    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 Not noted to be on any glucose lowering agents at home.  CBGs are reasonably well controlled here.  DVT Prophylaxis: Eliquis on hold perioperatively.  Lovenox ongoing -transition back to Eliquis at discharge Code Status: Full code Family Communication: Discussed at length with sister over the phone about dispo planning, goals of care, need for repeat surgery given poor healing wound. Disposition Plan: Living with roommate prior to last admission, currently residing at SNF postoperatively likely to return, PT and OT following  Status is: Inpatient Remains inpatient appropriate because: Cellulitis right lower extremity, wound dehiscence, IV antibiotics, possible pending procedure  Medications: Scheduled:  acetaminophen  650 mg Oral Q6H   amLODipine  10 mg Oral Daily   calcium acetate  667 mg Oral TID WC   Chlorhexidine Gluconate Cloth  6 each Topical Q0600   enoxaparin (LOVENOX) injection  50 mg Subcutaneous Q24H   feeding supplement (NEPRO CARB STEADY)  237 mL Oral BID BM   insulin aspart  0-6 Units Subcutaneous TID WC   multivitamin  1 tablet Oral QHS   pantoprazole  40 mg Oral BID   Continuous:  sodium chloride     PRN:lip balm, oxyCODONE-acetaminophen  Antibiotics: Anti-infectives (From admission, onward)    Start     Dose/Rate Route Frequency Ordered Stop   12/04/21 1200  vancomycin (VANCOREADY) IVPB 500 mg/100 mL  Status:  Discontinued        500 mg 100 mL/hr over 60 Minutes Intravenous Every M-W-F (Hemodialysis) 12/01/21 1803 12/01/21 2039   12/04/21 1200  vancomycin (VANCOREADY) IVPB 500 mg/100 mL        500 mg 100 mL/hr over 60 Minutes Intravenous Every M-W-F (Hemodialysis) 12/01/21 2039 12/06/21 1238   12/03/21  0815  vancomycin (VANCOCIN) IVPB 1000 mg/200 mL premix  Status:  Discontinued        1,000 mg 200 mL/hr over 60 Minutes Intravenous  Once 12/03/21 0726 12/03/21 0733   12/01/21 2200  ceFEPIme (MAXIPIME) 1 g in sodium chloride 0.9 % 100 mL IVPB        1 g 200 mL/hr over 30 Minutes Intravenous Every 24 hours 12/01/21 2037 12/08/21 0708   12/01/21 2030  ceFEPIme (MAXIPIME) 2 g in sodium chloride 0.9 % 100 mL IVPB  Status:  Discontinued        2 g 200 mL/hr over 30 Minutes Intravenous  Once 12/01/21 2026 12/01/21 2037   12/01/21 1715  vancomycin (VANCOCIN) IVPB 1000 mg/200 mL premix  Status:  Discontinued        1,000 mg 200 mL/hr over 60 Minutes Intravenous  Once 12/01/21 1702 12/01/21 1921   12/01/21 1715  cefTRIAXone (ROCEPHIN) 2 g in sodium chloride 0.9 % 100 mL IVPB        2 g 200 mL/hr over 30 Minutes Intravenous  Once 12/01/21 1702 12/01/21 1925       Objective:  Vital Signs  Vitals:   12/07/21 0927 12/07/21 1650 12/07/21 2103 12/08/21 0457  BP: 112/62 128/62 134/64 126/63  Pulse: 68 71 70 68  Resp: 18 18 18 16   Temp: 97.8 F (36.6 C) 97.9 F (36.6 C) 98.4 F (36.9 C) 98.5  F (36.9 C)  TempSrc: Oral     SpO2: 99% 100% 100% 100%  Weight:      Height:        Intake/Output Summary (Last 24 hours) at 12/08/2021 0722 Last data filed at 12/08/2021 0600 Gross per 24 hour  Intake 440 ml  Output 0 ml  Net 440 ml    Filed Weights   12/06/21 0835 12/06/21 1208 12/07/21 0500  Weight: 51.3 kg 50 kg 54.2 kg    General appearance: Awake alert.  Oriented to person and general situation only Resp: Clear to auscultation bilaterally.  Normal effort Cardio: S1-S2 is normal regular.  No S3-S4.  No rubs murmurs or bruit GI: Abdomen is soft.  Nontender nondistended.  Bowel sounds are present normal.  No masses organomegaly Extremities: Bilateral amputee Neurologic:  No focal neurological deficits.    Lab Results:  Data Reviewed: I have personally reviewed labs and imaging  study reports  CBC: Recent Labs  Lab 12/01/21 1815 12/02/21 0425 12/04/21 0144 12/04/21 0735 12/05/21 0348 12/06/21 0413 12/07/21 0443  WBC 11.0*   < > 9.1 8.7 8.9 7.4 6.3  NEUTROABS 9.0*  --   --   --   --   --   --   HGB 11.2*   < > 9.5* 10.0* 9.2* 10.2* 10.0*  HCT 34.4*   < > 28.6* 31.0* 27.9* 31.1* 30.7*  MCV 94.0   < > 90.5 91.4 90.6 89.9 90.6  PLT 177   < > 162 158 164 201 152   < > = values in this interval not displayed.     Basic Metabolic Panel: Recent Labs  Lab 12/01/21 1815 12/02/21 0425 12/03/21 0332 12/04/21 0144 12/06/21 0413  NA 136 138 132* 129* 130*  K 3.0* 3.3* 4.1 3.6 3.4*  CL 94* 97* 93* 91* 91*  CO2 26 24 20* 22 24  GLUCOSE 147* 105* 172* 128* 121*  BUN 26* 34* 50* 59* 41*  CREATININE 5.83* 6.63* 8.09* 8.91* 6.61*  CALCIUM 8.3* 8.3* 8.3* 8.2* 9.0   GFR: Estimated Creatinine Clearance: 7.7 mL/min (A) (by C-G formula based on SCr of 6.61 mg/dL (H)).  Liver Function Tests: Recent Labs  Lab 12/01/21 1815 12/02/21 0425  AST 22 19  ALT 12 9  ALKPHOS 89 76  BILITOT 0.7 0.3  PROT 8.6* 7.4  ALBUMIN 3.2* 2.7*    Recent Labs  Lab 12/01/21 1815  AMMONIA 17   Coagulation Profile: Recent Labs  Lab 12/01/21 1815  INR 1.3*   CBG: Recent Labs  Lab 12/06/21 2129 12/07/21 0737 12/07/21 1121 12/07/21 1647 12/07/21 2055  GLUCAP 102* 77 77 92 85      Recent Results (from the past 240 hour(s))  Blood Culture (routine x 2)     Status: None   Collection Time: 12/01/21  6:15 PM   Specimen: BLOOD  Result Value Ref Range Status   Specimen Description BLOOD SITE NOT SPECIFIED  Final   Special Requests   Final    BOTTLES DRAWN AEROBIC AND ANAEROBIC Blood Culture adequate volume   Culture   Final    NO GROWTH 5 DAYS Performed at [facility], [place] [address], [place], [place] [address]    Report Status 12/06/2021 FINAL  Final  Resp Panel by RT-PCR (Flu A&B, Covid) Nasopharyngeal Swab     Status: None   Collection Time: 12/02/21   3:30 AM   Specimen: Nasopharyngeal Swab; Nasopharyngeal(NP) swabs in vial transport medium  Result Value  Ref Range Status   SARS Coronavirus 2 by RT PCR NEGATIVE NEGATIVE Final    Comment: (NOTE) SARS-CoV-2 target nucleic acids are NOT DETECTED.  The SARS-CoV-2 RNA is generally detectable in upper respiratory specimens during the acute phase of infection. The lowest concentration of SARS-CoV-2 viral copies this assay can detect is 138 copies/mL. A negative result does not preclude SARS-Cov-2 infection and should not be used as the sole basis for treatment or other patient management decisions. A negative result may occur with  improper specimen collection/handling, submission of specimen other than nasopharyngeal swab, presence of viral mutation(s) within the areas targeted by this assay, and inadequate number of viral copies(<138 copies/mL). A negative result must be combined with clinical observations, patient history, and epidemiological information. The expected result is Negative.  Fact Sheet for Patients:  EntrepreneurPulse.com.au  Fact Sheet for Healthcare Providers:  IncredibleEmployment.be  This test is no t yet approved or cleared by the [place] FDA and  has been authorized for detection and/or diagnosis of SARS-CoV-2 by FDA under an Emergency Use Authorization (EUA). This EUA will remain  in effect (meaning this test can be used) for the duration of the COVID-19 declaration under Section 564(b)(1) of the Act, 21 U.S.C.section 360bbb-3(b)(1), unless the authorization is terminated  or revoked sooner.       Influenza A by PCR NEGATIVE NEGATIVE Final   Influenza B by PCR NEGATIVE NEGATIVE Final    Comment: (NOTE) The Xpert Xpress SARS-CoV-2/FLU/RSV plus assay is intended as an aid in the diagnosis of influenza from Nasopharyngeal swab specimens and should not be used as a sole basis for treatment. Nasal washings and aspirates  are unacceptable for Xpert Xpress SARS-CoV-2/FLU/RSV testing.  Fact Sheet for Patients: EntrepreneurPulse.com.au  Fact Sheet for Healthcare Providers: IncredibleEmployment.be  This test is not yet approved or cleared by the [place] FDA and has been authorized for detection and/or diagnosis of SARS-CoV-2 by FDA under an Emergency Use Authorization (EUA). This EUA will remain in effect (meaning this test can be used) for the duration of the COVID-19 declaration under Section 564(b)(1) of the Act, 21 U.S.C. section 360bbb-3(b)(1), unless the authorization is terminated or revoked.  Performed at [facility], [place] [address] [place], [place] [address]   Blood Culture (routine x 2)     Status: None   Collection Time: 12/02/21  4:25 AM   Specimen: BLOOD LEFT HAND  Result Value Ref Range Status   Specimen Description BLOOD LEFT HAND  Final   Special Requests AEROBIC BOTTLE ONLY Blood Culture adequate volume  Final   Culture   Final    NO GROWTH 5 DAYS Performed at [facility], [place] [address] [place], [place] [address]    Report Status 12/07/2021 FINAL  Final  Urine Culture     Status: Abnormal   Collection Time: 12/03/21  6:08 AM   Specimen: In/Out Cath Urine  Result Value Ref Range Status   Specimen Description IN/OUT CATH URINE  Final   Special Requests   Final    NONE Performed at [facility], [place] [address], [place], [place] [address]    Culture MULTIPLE SPECIES PRESENT, SUGGEST RECOLLECTION (A)  Final   Report Status 12/04/2021 FINAL  Final  Surgical PCR screen     Status: None   Collection Time: 12/04/21  5:01 AM   Specimen: Nasal Mucosa; Nasal Swab  Result Value Ref Range Status   MRSA, PCR NEGATIVE NEGATIVE Final   Staphylococcus  aureus NEGATIVE NEGATIVE Final    Comment: (NOTE) The Xpert SA Assay (FDA approved for NASAL specimens in patients 65 years of age and older), is one component of a  comprehensive surveillance program. It is not intended to diagnose infection nor to guide or monitor treatment. Performed at [facility], [place] [address] [place], [place] [address]   Aerobic/Anaerobic Culture w Gram Stain (surgical/deep wound)     Status: None (Preliminary result)   Collection Time: 12/04/21 10:19 AM   Specimen: Wound  Result Value Ref Range Status   Specimen Description WOUND  Final   Special Requests RIGHT STUMP SPEC A  Final   Gram Stain   Final    FEW WBC PRESENT, PREDOMINANTLY MONONUCLEAR RARE GRAM POSITIVE COCCI IN PAIRS Performed at [facility], [address], [place], [place] [address]    Culture   Final    FEW PROTEUS MIRABILIS FEW ENTEROCOCCUS FAECALIS SUSCEPTIBILITIES TO FOLLOW NO ANAEROBES ISOLATED; CULTURE IN PROGRESS FOR 5 DAYS    Report Status PENDING  Incomplete   Organism ID, Bacteria PROTEUS MIRABILIS  Final      Susceptibility   Proteus mirabilis - MIC*    AMPICILLIN <=2 SENSITIVE Sensitive     CEFAZOLIN <=4 SENSITIVE Sensitive     CEFEPIME <=0.12 SENSITIVE Sensitive     CEFTAZIDIME <=1 SENSITIVE Sensitive     CEFTRIAXONE <=0.25 SENSITIVE Sensitive     CIPROFLOXACIN <=0.25 SENSITIVE Sensitive     GENTAMICIN <=1 SENSITIVE Sensitive     IMIPENEM 2 SENSITIVE Sensitive     TRIMETH/SULFA <=20 SENSITIVE Sensitive     AMPICILLIN/SULBACTAM <=2 SENSITIVE Sensitive     PIP/TAZO <=4 SENSITIVE Sensitive     * FEW PROTEUS MIRABILIS       Radiology Studies: No results found.   LOS: 7 days   Roger Vazquez  [facility] Pager on [email or web address]  12/08/2021, 7:22 AM

## 2021-12-08 NOTE — Procedures (Signed)
Interventional Radiology Procedure Note  PROCEDURE SUMMARY:  Successful removal of tunneled catheter.  No complications.   EBL = trace  Please see full dictation in imaging section of Epic for procedure details.    Narda Rutherford, AGNP-BC 12/08/2021, 1:52 PM

## 2021-12-09 DIAGNOSIS — Z7189 Other specified counseling: Secondary | ICD-10-CM | POA: Diagnosis not present

## 2021-12-09 DIAGNOSIS — Z66 Do not resuscitate: Secondary | ICD-10-CM | POA: Diagnosis not present

## 2021-12-09 DIAGNOSIS — Z515 Encounter for palliative care: Secondary | ICD-10-CM | POA: Diagnosis not present

## 2021-12-09 DIAGNOSIS — L03115 Cellulitis of right lower limb: Secondary | ICD-10-CM | POA: Diagnosis not present

## 2021-12-09 LAB — AEROBIC/ANAEROBIC CULTURE W GRAM STAIN (SURGICAL/DEEP WOUND)

## 2021-12-09 MED ORDER — HYDROMORPHONE HCL 1 MG/ML IJ SOLN
1.0000 mg | INTRAMUSCULAR | Status: DC
  Administered 2021-12-09 – 2021-12-11 (×10): 1 mg via INTRAVENOUS
  Filled 2021-12-09 (×11): qty 1

## 2021-12-09 NOTE — Progress Notes (Signed)
° °  Palliative Medicine Inpatient Follow Up Note  HPI:  Roger Vazquez is a 71 y.o. male who presented with wound dehiscence of R BKA performed 10/20/20. Revision to R AKA recommended but pt has been declining procedure. PMH: ESRD on hemodialysis, diabetes mellitus, hypertension, lower extremity DVT, hx of L BKA.   Palliative care has been asked to discuss goals of care in the setting of patient's desire to stop aggressive modalities of life supportive interventions.  Today's Discussion (12/09/2021)  *Please note that this is a verbal dictation therefore any spelling or grammatical errors are due to the "[employer] One" system interpretation.  Chart reviewed inclusive of vital signs, progress notes, laboratory results, and diagnostic images.   I met with Yogi at bedside this morning, Dr. Avon Gully was present. Trevionne reviewed the desire to be comfortable. He wanted to be set up for breakfast though after Dr. Avon Gully and I repositioned him expressed discomfort. Nursing was informed for additional medication administration.  Otherwise it appears that Izick remains to have incremental pain episodes. Will therefore increase frequency of ATC dilaudid to Q4H.   Still awaiting a bed at [facility].  Objective Assessment: Vital Signs Vitals:   12/09/21 0457 12/09/21 0932  BP: (!) 127/59 138/65  Pulse: 78 75  Resp: 18 16  Temp: 98.3 F (36.8 C) 97.8 F (36.6 C)  SpO2: 100% 100%    Intake/Output Summary (Last 24 hours) at 12/09/2021 1121 Last data filed at 12/09/2021 0200 Gross per 24 hour  Intake 360 ml  Output 0 ml  Net 360 ml   Last Weight  Most recent update: 12/09/2021  5:04 AM    Weight  50.1 kg (110 lb 7.2 oz)            Gen:  Elderly AA M in NAD HEENT: moist mucous membranes CV: Regular rate and rhythm  PULM: On RA breathing is even and nonlabored ABD: soft/nontender EXT: Left BKA, right BKA with wrapping in place Neuro: Alert and oriented x3  SUMMARY OF  RECOMMENDATIONS   DNAR/DNI   Comfort focused care   Comfort medications per [facility]   Increase Dilaudid 1 mg IV push to every 4 hours around-the-clock to support ongoing pain of right stump   Unrestricted visitation   Appreciate chaplain involvement   Awaiting placement at [facility]  Prognosis < 2 weeks  MDM - High  ______________________________________________________________________________________ [place] Team Team Cell Phone: [phone] Please utilize secure chat with additional questions, if there is no response within 30 minutes please call the above phone number  Palliative Medicine Team providers are available by phone from 7am to 7pm daily and can be reached through the team cell phone.  Should this patient require assistance outside of these hours, please call the patient's attending physician.

## 2021-12-09 NOTE — Progress Notes (Addendum)
[facility])  Eligibility still pending for residential hospice, however we do not have a bed today.  [facility] will update once eligibility has been determined and we have an open bed.  Venia Carbon BSN, RN [facility] Liaison   **addendum 1600, approved for residential hospice at [facility].

## 2021-12-09 NOTE — Progress Notes (Signed)
[facility] PROGRESS NOTE   Roger Vazquez WLN:[id number] DOB: [birth year] DOA: 12/01/2021  8 DOS: the patient was seen and examined on 12/09/2021  PCP: [facility]  Brief History and Hospital Course:  71 y.o. 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  He was transferred to hospital.  Evaluated by vascular surgery.  Was hospitalized for further management.  He was offered above-knee amputation since chances of healing would be higher.  But patient refused and wanted to just do a revision of his BKA.  This was performed on 2/20.  Ongoing conversation with vascular surgery and family members about patient's prognosis.  He continues to refuse surgical advice recommending AKA given poor wound healing and increased risk for infection.  Patient indicates he is "done" as below, palliative care consulted for further discussion with patient and family about goals of care and potential transition to palliative care versus hospice.  Consultants: Vascular surgery.  Nephrology.  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 Hold medications unless for comfort. 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  Lovenox ongoing  Code Status: DNR - comfort measures/hospice Family Communication: Discussed at length with sister over the phone about dispo planning, goals of care Disposition Plan: Living with roommate prior to last admission, [place] hospice house  Status is: Inpatient Remains inpatient appropriate because: Cellulitis right lower extremity, wound dehiscence, IV antibiotics, possible pending procedure  Medications: Scheduled:  acetaminophen  650 mg Oral Q6H   amLODipine  10 mg Oral Daily    HYDROmorphone (DILAUDID) injection  1 mg Intravenous Q6H   Continuous:   DEY:CXKGYJEHUD oral rinse, glycopyrrolate **OR** glycopyrrolate **OR** glycopyrrolate, haloperidol **OR** haloperidol **OR** haloperidol lactate, HYDROmorphone (DILAUDID) injection, lip balm, LORazepam, ondansetron **OR** ondansetron (ZOFRAN) IV, oxyCODONE-acetaminophen, polyvinyl alcohol  Antibiotics: Anti-infectives (From admission, onward)    Start     Dose/Rate Route Frequency Ordered Stop   12/04/21 1200  vancomycin (VANCOREADY) IVPB 500 mg/100 mL  Status:  Discontinued        500 mg 100 mL/hr over 60 Minutes Intravenous Every M-W-F (Hemodialysis) 12/01/21  1803 12/01/21 2039   12/04/21 1200  vancomycin (VANCOREADY) IVPB  500 mg/100 mL        500 mg 100 mL/hr over 60 Minutes Intravenous Every M-W-F (Hemodialysis) 12/01/21 2039 12/06/21 1238   12/03/21 0815  vancomycin (VANCOCIN) IVPB 1000 mg/200 mL premix  Status:  Discontinued        1,000 mg 200 mL/hr over 60 Minutes Intravenous  Once 12/03/21 0726 12/03/21 0733   12/01/21 2200  ceFEPIme (MAXIPIME) 1 g in sodium chloride 0.9 % 100 mL IVPB        1 g 200 mL/hr over 30 Minutes Intravenous Every 24 hours 12/01/21 2037 12/08/21 0708   12/01/21 2030  ceFEPIme (MAXIPIME) 2 g in sodium chloride 0.9 % 100 mL IVPB  Status:  Discontinued        2 g 200 mL/hr over 30 Minutes Intravenous  Once 12/01/21 2026 12/01/21 2037   12/01/21 1715  vancomycin (VANCOCIN) IVPB 1000 mg/200 mL premix  Status:  Discontinued        1,000 mg 200 mL/hr over 60 Minutes Intravenous  Once 12/01/21 1702 12/01/21 1921   12/01/21 1715  cefTRIAXone (ROCEPHIN) 2 g in sodium chloride 0.9 % 100 mL IVPB        2 g 200 mL/hr over 30 Minutes Intravenous  Once 12/01/21 1702 12/01/21 1925       Objective:  Vital Signs  Vitals:   12/08/21 1047 12/08/21 1100 12/08/21 1111 12/09/21 0457  BP: (!) 110/57 127/66 137/65 (!) 127/59  Pulse:   72 78  Resp: 20 20 16 18   Temp: (!) 97.5 F (36.4 C) (!) 97.5 F (36.4 C) (!) 97.5 F (36.4 C) 98.3 F (36.8 C)  TempSrc:   Oral Oral  SpO2: 98% 98% 92% 100%  Weight:    50.1 kg  Height:        Intake/Output Summary (Last 24 hours) at 12/09/2021 0739 Last data filed at 12/09/2021 0200 Gross per 24 hour  Intake 360 ml  Output 622 ml  Net -262 ml    Filed Weights   12/07/21 0500 12/08/21 0730 12/09/21 0457  Weight: 54.2 kg 48.4 kg 50.1 kg    General appearance: Awake alert.  Oriented to person and general situation only Resp: Clear to auscultation bilaterally.  Normal effort Cardio: S1-S2 is normal regular.  No S3-S4.  No rubs murmurs or bruit GI: Abdomen is soft.  Nontender  nondistended.  Bowel sounds are present normal.  No masses organomegaly Extremities: Bilateral amputee Neurologic:  No focal neurological deficits.    Lab Results:  Data Reviewed: I have personally reviewed labs and imaging study reports  CBC: Recent Labs  Lab 12/04/21 0735 12/05/21 0348 12/06/21 0413 12/07/21 0443 12/08/21 0806  WBC 8.7 8.9 7.4 6.3 7.3  NEUTROABS  --   --   --   --  5.1  HGB 10.0* 9.2* 10.2* 10.0* 10.4*  HCT 31.0* 27.9* 31.1* 30.7* 32.1*  MCV 91.4 90.6 89.9 90.6 91.5  PLT 158 164 201 152 208     Basic Metabolic Panel: Recent Labs  Lab 12/03/21 0332 12/04/21 0144 12/06/21 0413 12/08/21 0807  NA 132* 129* 130* 131*  K 4.1 3.6 3.4* 3.7  CL 93* 91* 91* 95*  CO2 20* 22 24 20*  GLUCOSE 172* 128* 121* 74  BUN 50* 59* 41* 34*  CREATININE 8.09* 8.91* 6.61* 6.00*  CALCIUM 8.3* 8.2* 9.0 9.1  PHOS  --   --   --  4.7*   GFR: Estimated Creatinine Clearance: 7.9 mL/min (A) (by C-G formula based on  SCr of 6 mg/dL (H)).  Liver Function Tests: Recent Labs  Lab 12/08/21 0807  ALBUMIN 2.3*    No results for input(s): AMMONIA in the last 168 hours. Coagulation Profile: No results for input(s): INR, PROTIME in the last 168 hours. CBG: Recent Labs  Lab 12/07/21 0737 12/07/21 1121 12/07/21 1647 12/07/21 2055 12/08/21 1121  GLUCAP 77 77 92 85 95      Recent Results (from the past 240 hour(s))  Blood Culture (routine x 2)     Status: None   Collection Time: 12/01/21  6:15 PM   Specimen: BLOOD  Result Value Ref Range Status   Specimen Description BLOOD SITE NOT SPECIFIED  Final   Special Requests   Final    BOTTLES DRAWN AEROBIC AND ANAEROBIC Blood Culture adequate volume   Culture   Final    NO GROWTH 5 DAYS Performed at [facility], [place] [address] [place], [place] [address]    Report Status 12/06/2021 FINAL  Final  Resp Panel by RT-PCR (Flu A&B, Covid) Nasopharyngeal Swab     Status: None   Collection Time: 12/02/21  3:30 AM    Specimen: Nasopharyngeal Swab; Nasopharyngeal(NP) swabs in vial transport medium  Result Value Ref Range Status   SARS Coronavirus 2 by RT PCR NEGATIVE NEGATIVE Final    Comment: (NOTE) SARS-CoV-2 target nucleic acids are NOT DETECTED.  The SARS-CoV-2 RNA is generally detectable in upper respiratory specimens during the acute phase of infection. The lowest concentration of SARS-CoV-2 viral copies this assay can detect is 138 copies/mL. A negative result does not preclude SARS-Cov-2 infection and should not be used as the sole basis for treatment or other patient management decisions. A negative result may occur with  improper specimen collection/handling, submission of specimen other than nasopharyngeal swab, presence of viral mutation(s) within the areas targeted by this assay, and inadequate number of viral copies(<138 copies/mL). A negative result must be combined with clinical observations, patient history, and epidemiological information. The expected result is Negative.  Fact Sheet for Patients:  EntrepreneurPulse.com.au  Fact Sheet for Healthcare Providers:  IncredibleEmployment.be  This test is no t yet approved or cleared by the [place] FDA and  has been authorized for detection and/or diagnosis of SARS-CoV-2 by FDA under an Emergency Use Authorization (EUA). This EUA will remain  in effect (meaning this test can be used) for the duration of the COVID-19 declaration under Section 564(b)(1) of the Act, 21 U.S.C.section 360bbb-3(b)(1), unless the authorization is terminated  or revoked sooner.       Influenza A by PCR NEGATIVE NEGATIVE Final   Influenza B by PCR NEGATIVE NEGATIVE Final    Comment: (NOTE) The Xpert Xpress SARS-CoV-2/FLU/RSV plus assay is intended as an aid in the diagnosis of influenza from Nasopharyngeal swab specimens and should not be used as a sole basis for treatment. Nasal washings and aspirates are  unacceptable for Xpert Xpress SARS-CoV-2/FLU/RSV testing.  Fact Sheet for Patients: EntrepreneurPulse.com.au  Fact Sheet for Healthcare Providers: IncredibleEmployment.be  This test is not yet approved or cleared by the [place] FDA and has been authorized for detection and/or diagnosis of SARS-CoV-2 by FDA under an Emergency Use Authorization (EUA). This EUA will remain in effect (meaning this test can be used) for the duration of the COVID-19 declaration under Section 564(b)(1) of the Act, 21 U.S.C. section 360bbb-3(b)(1), unless the authorization is terminated or revoked.  Performed at [facility], [place] [address], [place], [place] [address]  Blood Culture (routine x 2)     Status: None   Collection Time: 12/02/21  4:25 AM   Specimen: BLOOD LEFT HAND  Result Value Ref Range Status   Specimen Description BLOOD LEFT HAND  Final   Special Requests AEROBIC BOTTLE ONLY Blood Culture adequate volume  Final   Culture   Final    NO GROWTH 5 DAYS Performed at [facility], [address], [place], [place] [address]    Report Status 12/07/2021 FINAL  Final  Urine Culture     Status: Abnormal   Collection Time: 12/03/21  6:08 AM   Specimen: In/Out Cath Urine  Result Value Ref Range Status   Specimen Description IN/OUT CATH URINE  Final   Special Requests   Final    NONE Performed at [facility], [place] [address] [place], [place] [address]    Culture MULTIPLE SPECIES PRESENT, SUGGEST RECOLLECTION (A)  Final   Report Status 12/04/2021 FINAL  Final  Surgical PCR screen     Status: None   Collection Time: 12/04/21  5:01 AM   Specimen: Nasal Mucosa; Nasal Swab  Result Value Ref Range Status   MRSA, PCR NEGATIVE NEGATIVE Final   Staphylococcus aureus NEGATIVE NEGATIVE Final    Comment: (NOTE) The Xpert SA Assay (FDA approved for NASAL specimens in patients 83 years of age and older), is one component of a  comprehensive surveillance program. It is not intended to diagnose infection nor to guide or monitor treatment. Performed at [facility], [place] [address], [place], [place] [address]   Aerobic/Anaerobic Culture w Gram Stain (surgical/deep wound)     Status: None (Preliminary result)   Collection Time: 12/04/21 10:19 AM   Specimen: Wound  Result Value Ref Range Status   Specimen Description WOUND  Final   Special Requests RIGHT STUMP SPEC A  Final   Gram Stain   Final    FEW WBC PRESENT, PREDOMINANTLY MONONUCLEAR RARE GRAM POSITIVE COCCI IN PAIRS Performed at [facility], [address], [place], [place] [address]    Culture   Final    FEW PROTEUS MIRABILIS FEW ENTEROCOCCUS FAECALIS NO ANAEROBES ISOLATED; CULTURE IN PROGRESS FOR 5 DAYS    Report Status PENDING  Incomplete   Organism ID, Bacteria PROTEUS MIRABILIS  Final   Organism ID, Bacteria ENTEROCOCCUS FAECALIS  Final      Susceptibility   Enterococcus faecalis - MIC*    AMPICILLIN <=2 SENSITIVE Sensitive     VANCOMYCIN 1 SENSITIVE Sensitive     GENTAMICIN SYNERGY SENSITIVE Sensitive     * FEW ENTEROCOCCUS FAECALIS   Proteus mirabilis - MIC*    AMPICILLIN <=2 SENSITIVE Sensitive     CEFAZOLIN <=4 SENSITIVE Sensitive     CEFEPIME <=0.12 SENSITIVE Sensitive     CEFTAZIDIME <=1 SENSITIVE Sensitive     CEFTRIAXONE <=0.25 SENSITIVE Sensitive     CIPROFLOXACIN <=0.25 SENSITIVE Sensitive     GENTAMICIN <=1 SENSITIVE Sensitive     IMIPENEM 2 SENSITIVE Sensitive     TRIMETH/SULFA <=20 SENSITIVE Sensitive     AMPICILLIN/SULBACTAM <=2 SENSITIVE Sensitive     PIP/TAZO <=4 SENSITIVE Sensitive     * FEW PROTEUS MIRABILIS       Radiology Studies: IR Removal Tun Cv Cath W/O FL  Result Date: 12/08/2021 INDICATION: Successful use of AVG for HD x2. Request for tunneled dialysis catheter removal. EXAM: REMOVAL OF TUNNELED HEMODIALYSIS CATHETER MEDICATIONS: 10 mL 1 % lidocaine COMPLICATIONS: None immediate.  PROCEDURE:  Verbal consent was obtained from the patient following an explanation of the procedure, risks, benefits and alternatives to treatment. A time out was performed prior to the initiation of the procedure. Sterile technique was utilized including mask, sterile gloves, sterile drape, and hand hygiene. ChloraPrep was used to prep the patient's right neck, chest and existing catheter. 1% lidocaine was injected around the catheter and the subcutaneous tunnel. The catheter was dissected out using scissors and curved hemostats until the cuff was freed from the surrounding fibrous sheath. The catheter was removed intact. Hemostasis was obtained with manual compression. A dressing was placed. The patient tolerated the procedure well without immediate post procedural complication. IMPRESSION: Successful removal of tunneled dialysis catheter. Read by: Narda Rutherford, AGNP-BC Electronically Signed   By: Aletta Edouard M.D.   On: 12/08/2021 14:53     LOS: 8 days   [facility] Pager on [email or web address]  12/09/2021, 7:39 AM

## 2021-12-09 NOTE — Progress Notes (Addendum)
[facility] BRIEF NOTE  Roger Vazquez 01-15-1951 [id number]   Patient transitioning to hospice/full comfort care.  No further dialysis.  Will sign off.   Jen Mow, PA-C [facility] Pager: [phone]

## 2021-12-10 DIAGNOSIS — Z515 Encounter for palliative care: Secondary | ICD-10-CM

## 2021-12-10 NOTE — Progress Notes (Signed)
[facility] PROGRESS NOTE   Roger Vazquez IEP:[id number] DOB: [birth year] DOA: 12/01/2021  9 DOS: the patient was seen and examined on 12/10/2021  PCP: [facility]  Brief History and Hospital Course:  71 y.o. 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  He was transferred to hospital.  Evaluated by vascular surgery.  Was hospitalized for further management.  He was offered above-knee amputation since chances of healing would be higher.  But patient refused and wanted to just do a revision of his BKA.  This was performed on 2/20.  Ongoing conversation with vascular surgery and family members about patient's prognosis.  He continues to refuse surgical advice recommending AKA given poor wound healing and increased risk for infection.  Patient indicates he is "done" as below, palliative care consulted for further discussion with patient and family about goals of care -currently on comfort measures pending transition to hospice house.  Consultants: Vascular surgery.  Nephrology.  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 Hold medications unless for comfort. 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)  DVT Prophylaxis: Lovenox ongoing  Code Status: DNR - comfort measures/hospice Family Communication: Discussed at length with sister over the phone about dispo planning, goals of care Disposition Plan: Living with roommate prior to last admission, DC hospice house  Status is: Inpatient Remains inpatient appropriate because: Cellulitis right lower extremity, wound dehiscence, IV antibiotics, possible pending procedure  Medications: Scheduled:  acetaminophen  650 mg Oral Q6H   amLODipine  10 mg Oral Daily    HYDROmorphone (DILAUDID) injection  1 mg Intravenous Q4H    YSA:[id number] oral rinse, glycopyrrolate **OR** glycopyrrolate **OR** glycopyrrolate, haloperidol **OR** haloperidol **OR** haloperidol lactate, HYDROmorphone (DILAUDID) injection, lip balm, LORazepam, ondansetron **OR** ondansetron (ZOFRAN) IV, oxyCODONE-acetaminophen, polyvinyl alcohol  Antibiotics: Anti-infectives (From admission, onward)    Start     Dose/Rate Route Frequency Ordered Stop   12/04/21 1200  vancomycin (VANCOREADY) IVPB 500 mg/100 mL  Status:  Discontinued        500 mg 100 mL/hr over 60 Minutes Intravenous Every M-W-F (Hemodialysis) 12/01/21 1803 12/01/21 2039   12/04/21 1200  vancomycin (VANCOREADY) IVPB 500 mg/100 mL        500 mg 100 mL/hr over 60 Minutes Intravenous Every M-W-F (Hemodialysis)  12/01/21 2039 12/06/21 1238   12/03/21 0815  vancomycin (VANCOCIN) IVPB 1000 mg/200 mL premix  Status:  Discontinued        1,000 mg 200 mL/hr over 60 Minutes Intravenous  Once 12/03/21 0726 12/03/21 0733   12/01/21 2200  ceFEPIme (MAXIPIME) 1 g in sodium chloride 0.9 % 100 mL IVPB        1 g 200 mL/hr over 30 Minutes Intravenous Every 24 hours 12/01/21 2037 12/08/21 0708   12/01/21 2030  ceFEPIme (MAXIPIME) 2 g in sodium chloride 0.9 % 100 mL IVPB  Status:  Discontinued        2 g 200 mL/hr over 30 Minutes Intravenous  Once 12/01/21 2026 12/01/21 2037   12/01/21 1715  vancomycin (VANCOCIN) IVPB 1000 mg/200 mL premix  Status:  Discontinued        1,000 mg 200 mL/hr over 60 Minutes Intravenous  Once 12/01/21 1702 12/01/21 1921   12/01/21 1715  cefTRIAXone (ROCEPHIN) 2 g in sodium chloride 0.9 % 100 mL IVPB        2 g 200 mL/hr over 30 Minutes Intravenous  Once 12/01/21 1702 12/01/21 1925       Objective:  Vital Signs  Vitals:   12/08/21 1100 12/08/21 1111 12/09/21 0457 12/09/21 0932  BP: 127/66 137/65 (!) 127/59 138/65  Pulse:  72 78 75  Resp: 20 16 18 16   Temp: (!) 97.5 F (36.4 C) (!) 97.5 F (36.4 C) 98.3 F (36.8 C) 97.8 F (36.6 C)  TempSrc:  Oral Oral Oral  SpO2: 98% 92% 100% 100%  Weight:   50.1 kg   Height:        Intake/Output Summary (Last 24 hours) at 12/10/2021 0721 Last data filed at 12/09/2021 1700 Gross per 24 hour  Intake 600 ml  Output 0 ml  Net 600 ml    Filed Weights   12/07/21 0500 12/08/21 0730 12/09/21 0457  Weight: 54.2 kg 48.4 kg 50.1 kg    General appearance: Awake alert.  No acute distress, resting comfortably Resp: Clear to auscultation bilaterally.  Normal effort Cardio: S1-S2 is normal regular.  No S3-S4.  No rubs murmurs or bruit GI: Abdomen is soft.  Nontender nondistended.  Bowel sounds are present normal.  No masses organomegaly Extremities: Bilateral amputee Neurologic:  No focal neurological deficits.    Lab Results:  Data  Reviewed: I have personally reviewed labs and imaging study reports  CBC: Recent Labs  Lab 12/04/21 0735 12/05/21 0348 12/06/21 0413 12/07/21 0443 12/08/21 0806  WBC 8.7 8.9 7.4 6.3 7.3  NEUTROABS  --   --   --   --  5.1  HGB 10.0* 9.2* 10.2* 10.0* 10.4*  HCT 31.0* 27.9* 31.1* 30.7* 32.1*  MCV 91.4 90.6 89.9 90.6 91.5  PLT 158 164 201 152 208     Basic Metabolic Panel: Recent Labs  Lab 12/04/21 0144 12/06/21 0413 12/08/21 0807  NA 129* 130* 131*  K 3.6 3.4* 3.7  CL 91* 91* 95*  CO2 22 24 20*  GLUCOSE 128* 121* 74  BUN 59* 41* 34*  CREATININE 8.91* 6.61* 6.00*  CALCIUM 8.2* 9.0 9.1  PHOS  --   --  4.7*   GFR: Estimated Creatinine Clearance: 7.9 mL/min (A) (by C-G formula based on SCr of 6 mg/dL (H)).  Liver Function Tests: Recent Labs  Lab 12/08/21 0807  ALBUMIN 2.3*    No results for input(s): AMMONIA in the last 168 hours. Coagulation Profile: No results for input(s): INR,  PROTIME in the last 168 hours. CBG: Recent Labs  Lab 12/07/21 0737 12/07/21 1121 12/07/21 1647 12/07/21 2055 12/08/21 1121  GLUCAP 77 77 92 85 95      Recent Results (from the past 240 hour(s))  Blood Culture (routine x 2)     Status: None   Collection Time: 12/01/21  6:15 PM   Specimen: BLOOD  Result Value Ref Range Status   Specimen Description BLOOD SITE NOT SPECIFIED  Final   Special Requests   Final    BOTTLES DRAWN AEROBIC AND ANAEROBIC Blood Culture adequate volume   Culture   Final    NO GROWTH 5 DAYS Performed at [facility], [place] [address] [place], [place] [address]    Report Status 12/06/2021 FINAL  Final  Resp Panel by RT-PCR (Flu A&B, Covid) Nasopharyngeal Swab     Status: None   Collection Time: 12/02/21  3:30 AM   Specimen: Nasopharyngeal Swab; Nasopharyngeal(NP) swabs in vial transport medium  Result Value Ref Range Status   SARS Coronavirus 2 by RT PCR NEGATIVE NEGATIVE Final    Comment: (NOTE) SARS-CoV-2 target nucleic acids are NOT  DETECTED.  The SARS-CoV-2 RNA is generally detectable in upper respiratory specimens during the acute phase of infection. The lowest concentration of SARS-CoV-2 viral copies this assay can detect is 138 copies/mL. A negative result does not preclude SARS-Cov-2 infection and should not be used as the sole basis for treatment or other patient management decisions. A negative result may occur with  improper specimen collection/handling, submission of specimen other than nasopharyngeal swab, presence of viral mutation(s) within the areas targeted by this assay, and inadequate number of viral copies(<138 copies/mL). A negative result must be combined with clinical observations, patient history, and epidemiological information. The expected result is Negative.  Fact Sheet for Patients:  EntrepreneurPulse.com.au  Fact Sheet for Healthcare Providers:  IncredibleEmployment.be  This test is no t yet approved or cleared by the [place] FDA and  has been authorized for detection and/or diagnosis of SARS-CoV-2 by FDA under an Emergency Use Authorization (EUA). This EUA will remain  in effect (meaning this test can be used) for the duration of the COVID-19 declaration under Section 564(b)(1) of the Act, 21 U.S.C.section 360bbb-3(b)(1), unless the authorization is terminated  or revoked sooner.       Influenza A by PCR NEGATIVE NEGATIVE Final   Influenza B by PCR NEGATIVE NEGATIVE Final    Comment: (NOTE) The Xpert Xpress SARS-CoV-2/FLU/RSV plus assay is intended as an aid in the diagnosis of influenza from Nasopharyngeal swab specimens and should not be used as a sole basis for treatment. Nasal washings and aspirates are unacceptable for Xpert Xpress SARS-CoV-2/FLU/RSV testing.  Fact Sheet for Patients: EntrepreneurPulse.com.au  Fact Sheet for Healthcare Providers: IncredibleEmployment.be  This test is not yet  approved or cleared by the [place] FDA and has been authorized for detection and/or diagnosis of SARS-CoV-2 by FDA under an Emergency Use Authorization (EUA). This EUA will remain in effect (meaning this test can be used) for the duration of the COVID-19 declaration under Section 564(b)(1) of the Act, 21 U.S.C. section 360bbb-3(b)(1), unless the authorization is terminated or revoked.  Performed at [facility], [place] [address], [place], [place] [address]   Blood Culture (routine x 2)     Status: None   Collection Time: 12/02/21  4:25 AM   Specimen: BLOOD LEFT HAND  Result Value Ref Range Status   Specimen Description BLOOD LEFT HAND  Final   Special Requests AEROBIC BOTTLE ONLY Blood Culture adequate volume  Final   Culture   Final    NO GROWTH 5 DAYS Performed at [facility], [place] [address] [place], [place] [address]    Report Status 12/07/2021 FINAL  Final  Urine Culture     Status: Abnormal   Collection Time: 12/03/21  6:08 AM   Specimen: In/Out Cath Urine  Result Value Ref Range Status   Specimen Description IN/OUT CATH URINE  Final   Special Requests   Final    NONE Performed at [facility], [place] [address], [place], [place] [address]    Culture MULTIPLE SPECIES PRESENT, SUGGEST RECOLLECTION (A)  Final   Report Status 12/04/2021 FINAL  Final  Surgical PCR screen     Status: None   Collection Time: 12/04/21  5:01 AM   Specimen: Nasal Mucosa; Nasal Swab  Result Value Ref Range Status   MRSA, PCR NEGATIVE NEGATIVE Final   Staphylococcus aureus NEGATIVE NEGATIVE Final    Comment: (NOTE) The Xpert SA Assay (FDA approved for NASAL specimens in patients 59 years of age and older), is one component of a comprehensive surveillance program. It is not intended to diagnose infection nor to guide or monitor treatment. Performed at [facility], [place] [address] [place], [place] [address]   Aerobic/Anaerobic Culture w Gram Stain (surgical/deep  wound)     Status: None   Collection Time: 12/04/21 10:19 AM   Specimen: Wound  Result Value Ref Range Status   Specimen Description WOUND  Final   Special Requests RIGHT STUMP SPEC A  Final   Gram Stain   Final    FEW WBC PRESENT, PREDOMINANTLY MONONUCLEAR RARE GRAM POSITIVE COCCI IN PAIRS    Culture   Final    FEW PROTEUS MIRABILIS FEW ENTEROCOCCUS FAECALIS NO ANAEROBES ISOLATED Performed at [facility], [place] [address], [place], [place] [address]    Report Status 12/09/2021 FINAL  Final   Organism ID, Bacteria PROTEUS MIRABILIS  Final   Organism ID, Bacteria ENTEROCOCCUS FAECALIS  Final      Susceptibility   Enterococcus faecalis - MIC*    AMPICILLIN <=2 SENSITIVE Sensitive     VANCOMYCIN 1 SENSITIVE Sensitive     GENTAMICIN SYNERGY SENSITIVE Sensitive     * FEW ENTEROCOCCUS FAECALIS   Proteus mirabilis - MIC*    AMPICILLIN <=2 SENSITIVE Sensitive     CEFAZOLIN <=4 SENSITIVE Sensitive     CEFEPIME <=0.12 SENSITIVE Sensitive     CEFTAZIDIME <=1 SENSITIVE Sensitive     CEFTRIAXONE <=0.25 SENSITIVE Sensitive     CIPROFLOXACIN <=0.25 SENSITIVE Sensitive     GENTAMICIN <=1 SENSITIVE Sensitive     IMIPENEM 2 SENSITIVE Sensitive     TRIMETH/SULFA <=20 SENSITIVE Sensitive     AMPICILLIN/SULBACTAM <=2 SENSITIVE Sensitive     PIP/TAZO <=4 SENSITIVE Sensitive     * FEW PROTEUS MIRABILIS       Radiology Studies: IR Removal Tun Cv Cath W/O FL  Result Date: 12/08/2021 INDICATION: Successful use of AVG for HD x2. Request for tunneled dialysis catheter removal. EXAM: REMOVAL OF TUNNELED HEMODIALYSIS CATHETER MEDICATIONS: 10 mL 1 % lidocaine COMPLICATIONS: None immediate. PROCEDURE: Verbal consent was obtained from the patient following an explanation of the procedure, risks, benefits and alternatives to treatment. A time out was performed prior to the initiation of the procedure. Sterile technique was utilized including mask, sterile gloves, sterile drape, and hand hygiene.  ChloraPrep was used to prep the patient's right neck, chest and existing catheter. 1% lidocaine was injected around the catheter and the subcutaneous tunnel. The catheter was dissected out using scissors and curved hemostats until the cuff was freed from the surrounding fibrous sheath. The catheter was removed intact. Hemostasis was obtained with manual compression. A dressing was placed. The patient tolerated the procedure well without immediate post procedural complication. IMPRESSION: Successful removal of tunneled dialysis catheter. Read by: Narda Rutherford, AGNP-BC Electronically Signed   By: Aletta Edouard M.D.   On: 12/08/2021 14:53     LOS: 9 days   [facility] Pager on [email or web address]  12/10/2021, 7:21 AM

## 2021-12-10 NOTE — Progress Notes (Signed)
° °  Palliative Medicine Inpatient Follow Up Note  HPI:  Roger Vazquez is a 71 y.o. male who presented with wound dehiscence of R BKA performed 10/20/20. Revision to R AKA recommended but pt has been declining procedure. PMH: ESRD on hemodialysis, diabetes mellitus, hypertension, lower extremity DVT, hx of L BKA.   Palliative care has been asked to discuss goals of care in the setting of patient's desire to stop aggressive modalities of life supportive interventions.  Today's Discussion (12/10/2021)  *Please note that this is a verbal dictation therefore any spelling or grammatical errors are due to the "[facility] [employer]" system interpretation.  Chart reviewed inclusive of vital signs, progress notes, laboratory results, and diagnostic images.   I met with Mitul at bedside this morning, he was resting comfortably in no acute distress.  He does appear more delirious this morning and is making comments that are nonsensical.  He denies pain, nausea, difficulty breathing.  Reviewed the plan for transition to inpatient hospice once a bed becomes available.  Symptoms appear well controlled today, patient required Dilaudid 1 mg IV push in addition to his every 4 hour around-the-clock dosing.  Still awaiting a bed at [facility].  Objective Assessment: Vital Signs Vitals:   12/09/21 0457 12/09/21 0932  BP: (!) 127/59 138/65  Pulse: 78 75  Resp: 18 16  Temp: 98.3 F (36.8 C) 97.8 F (36.6 C)  SpO2: 100% 100%    Intake/Output Summary (Last 24 hours) at 12/10/2021 1275 Last data filed at 12/10/2021 0600 Gross per 24 hour  Intake 720 ml  Output 0 ml  Net 720 ml    Last Weight  Most recent update: 12/09/2021  5:04 AM    Weight  50.1 kg (110 lb 7.2 oz)            Gen:  Elderly AA M in NAD HEENT: moist mucous membranes CV: Regular rate and rhythm  PULM: On RA breathing is even and nonlabored ABD: soft/nontender EXT: Left BKA, right BKA with wrapping in place Neuro: Alert and  oriented x3  SUMMARY OF RECOMMENDATIONS   DNAR/DNI   Comfort focused care   Comfort medications per [facility]   Increase Dilaudid 1 mg IV push to every 4 hours around-the-clock to support ongoing pain of right stump   Unrestricted visitation   Appreciate chaplain involvement   Awaiting placement at [facility]  Prognosis < 2 weeks  MDM - High  ______________________________________________________________________________________ [facility] Team Team Cell Phone: [phone] Please utilize secure chat with additional questions, if there is no response within 30 minutes please call the above phone number  Palliative Medicine Team providers are available by phone from 7am to 7pm daily and can be reached through the team cell phone.  Should this patient require assistance outside of these hours, please call the patient's attending physician.

## 2021-12-10 NOTE — Progress Notes (Signed)
[facility])  Received request from [facility] for family interest in [place]. Eligibility approved. [facility] is unable to offer a room today. Hospital Liaison will follow up tomorrow or sooner if a room becomes available.   Please do not hesitate to call with questions.   Thank you,  Clementeen Hoof, BSN, RN [place] (listed on AMION under [facility]osp[facility] and [place] [phone]   [phone]

## 2021-12-11 DIAGNOSIS — L8990 Pressure ulcer of unspecified site, unspecified stage: Secondary | ICD-10-CM | POA: Insufficient documentation

## 2021-12-11 DIAGNOSIS — Z515 Encounter for palliative care: Secondary | ICD-10-CM | POA: Diagnosis not present

## 2021-12-11 DIAGNOSIS — T8743 Infection of amputation stump, right lower extremity: Secondary | ICD-10-CM

## 2021-12-11 DIAGNOSIS — G9341 Metabolic encephalopathy: Secondary | ICD-10-CM | POA: Diagnosis not present

## 2021-12-11 DIAGNOSIS — L03115 Cellulitis of right lower limb: Secondary | ICD-10-CM | POA: Diagnosis not present

## 2021-12-11 NOTE — Progress Notes (Signed)
[facility] PROGRESS NOTE   Roger Vazquez:[id number] DOB: 06/20/1951 DOA: 12/01/2021  10 DOS: the patient was seen and examined on 12/11/2021  PCP: [facility]  Brief History and Hospital Course:  71 y.o. 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  He was transferred to hospital.  Evaluated by vascular surgery.  Was hospitalized for further management.  He was offered above-knee amputation since chances of healing would be higher.  But patient refused and wanted to just do a revision of his BKA.  This was performed on 2/20.  Ongoing conversation with vascular surgery and family members about patient's prognosis.  He continues to refuse surgical advice recommending AKA given poor wound healing and increased risk for infection.  Patient indicates he is "done" as below, palliative care consulted for further discussion with patient and family about goals of care -currently on comfort measures pending transition to hospice house.  Consultants: Vascular surgery.  Nephrology.  Palliative care  Procedures: Revision of right below-knee amputation  Subjective: No acute issues or events overnight, pain currently well controlled  Assessment/Plan:  Principal Problem:   Cellulitis of right lower extremity Active Problems:   Type 2 diabetes mellitus with chronic kidney disease on chronic dialysis (HCC)   HTN (hypertension)   ESRD on hemodialysis (HCC)   Anemia in chronic kidney disease   Acute metabolic encephalopathy   DVT (deep venous thrombosis) (HCC)   Postoperative wound dehiscence   UTI (urinary tract infection)   Protein-calorie malnutrition,  severe   Pressure injury of skin  Goals of Care Transition to comfort measures Family and patient agreeable for transition to comfort measures/hospice Stop HD, labs, invasive care. Hold medications unless for comfort. Appreciate palliative care consults  Cellulitis of right lower extremity- (present on admission) UTI (urinary tract infection), POA DVT (deep venous thrombosis) (HCC)- (present on admission) Acute metabolic encephalopathy- (present on admission) Chronic anemia in chronic kidney disease ESRD on hemodialysis (HCC) HTN (hypertension) Type 2 diabetes mellitus with chronic kidney disease on chronic dialysis ([place])  DVT Prophylaxis: Lovenox ongoing  Code Status: DNR - comfort measures/hospice Family Communication: Discussed at length with sister over the phone about dispo planning, goals of care Disposition Plan: Living with roommate prior to last admission, DC hospice house  Status is: Inpatient Remains inpatient appropriate because: Cellulitis right lower extremity, wound dehiscence, IV antibiotics, possible pending procedure  Medications: Scheduled:  acetaminophen  650 mg Oral Q6H    HYDROmorphone (DILAUDID) injection  1 mg Intravenous Q4H    WJX:BJYNWGNFAO oral rinse, glycopyrrolate **OR** glycopyrrolate **OR** glycopyrrolate, haloperidol **OR** haloperidol **OR** haloperidol lactate, HYDROmorphone (DILAUDID) injection, lip balm, LORazepam, ondansetron **OR** ondansetron (ZOFRAN) IV, oxyCODONE-acetaminophen, polyvinyl alcohol  Antibiotics: Anti-infectives (From admission, onward)    Start     Dose/Rate Route Frequency Ordered Stop   12/04/21 1200  vancomycin (VANCOREADY) IVPB 500 mg/100 mL  Status:  Discontinued        500 mg 100 mL/hr over 60 Minutes Intravenous Every M-W-F (Hemodialysis) 12/01/21 1803 12/01/21 2039   12/04/21 1200  vancomycin (VANCOREADY) IVPB 500 mg/100 mL        500 mg 100 mL/hr over 60 Minutes Intravenous Every M-W-F (Hemodialysis) 12/01/21  2039  12/06/21 1238   12/03/21 0815  vancomycin (VANCOCIN) IVPB 1000 mg/200 mL premix  Status:  Discontinued        1,000 mg 200 mL/hr over 60 Minutes Intravenous  Once 12/03/21 0726 12/03/21 0733   12/01/21 2200  ceFEPIme (MAXIPIME) 1 g in sodium chloride 0.9 % 100 mL IVPB        1 g 200 mL/hr over 30 Minutes Intravenous Every 24 hours 12/01/21 2037 12/08/21 0708   12/01/21 2030  ceFEPIme (MAXIPIME) 2 g in sodium chloride 0.9 % 100 mL IVPB  Status:  Discontinued        2 g 200 mL/hr over 30 Minutes Intravenous  Once 12/01/21 2026 12/01/21 2037   12/01/21 1715  vancomycin (VANCOCIN) IVPB 1000 mg/200 mL premix  Status:  Discontinued        1,000 mg 200 mL/hr over 60 Minutes Intravenous  Once 12/01/21 1702 12/01/21 1921   12/01/21 1715  cefTRIAXone (ROCEPHIN) 2 g in sodium chloride 0.9 % 100 mL IVPB        2 g 200 mL/hr over 30 Minutes Intravenous  Once 12/01/21 1702 12/01/21 1925       Objective:  Vital Signs  Vitals:   12/08/21 1111 12/09/21 0457 12/09/21 0932 12/10/21 1700  BP: 137/65 (!) 127/59 138/65 128/69  Pulse: 72 78 75 71  Resp: 16 18 16 17   Temp: (!) 97.5 F (36.4 C) 98.3 F (36.8 C) 97.8 F (36.6 C) 98 F (36.7 C)  TempSrc: Oral Oral Oral   SpO2: 92% 100% 100% 94%  Weight:  50.1 kg    Height:        Intake/Output Summary (Last 24 hours) at 12/11/2021 0730 Last data filed at 12/11/2021 0600 Gross per 24 hour  Intake 560 ml  Output 0 ml  Net 560 ml    Filed Weights   12/07/21 0500 12/08/21 0730 12/09/21 0457  Weight: 54.2 kg 48.4 kg 50.1 kg    General appearance: Awake alert.  No acute distress, resting comfortably Resp: Clear to auscultation bilaterally.  Normal effort Cardio: S1-S2 is normal regular.  No S3-S4.  No rubs murmurs or bruit GI: Abdomen is soft.  Nontender nondistended.  Bowel sounds are present normal.  No masses organomegaly Extremities: Bilateral amputee Neurologic:  No focal neurological deficits.    Lab Results:  Data Reviewed: I  have personally reviewed labs and imaging study reports  CBC: Recent Labs  Lab 12/04/21 0735 12/05/21 0348 12/06/21 0413 12/07/21 0443 12/08/21 0806  WBC 8.7 8.9 7.4 6.3 7.3  NEUTROABS  --   --   --   --  5.1  HGB 10.0* 9.2* 10.2* 10.0* 10.4*  HCT 31.0* 27.9* 31.1* 30.7* 32.1*  MCV 91.4 90.6 89.9 90.6 91.5  PLT 158 164 201 152 208     Basic Metabolic Panel: Recent Labs  Lab 12/06/21 0413 12/08/21 0807  NA 130* 131*  K 3.4* 3.7  CL 91* 95*  CO2 24 20*  GLUCOSE 121* 74  BUN 41* 34*  CREATININE 6.61* 6.00*  CALCIUM 9.0 9.1  PHOS  --  4.7*   GFR: Estimated Creatinine Clearance: 7.9 mL/min (A) (by C-G formula based on SCr of 6 mg/dL (H)).  Liver Function Tests: Recent Labs  Lab 12/08/21 0807  ALBUMIN 2.3*    No results for input(s): AMMONIA in the last 168 hours. Coagulation Profile: No results for input(s): INR, PROTIME in the last 168 hours. CBG: Recent Labs  Lab 12/07/21 0737 12/07/21 1121 12/07/21  1647 12/07/21 2055 12/08/21 1121  GLUCAP 77 77 92 85 95      Recent Results (from the past 240 hour(s))  Blood Culture (routine x 2)     Status: None   Collection Time: 12/01/21  6:15 PM   Specimen: BLOOD  Result Value Ref Range Status   Specimen Description BLOOD SITE NOT SPECIFIED  Final   Special Requests   Final    BOTTLES DRAWN AEROBIC AND ANAEROBIC Blood Culture adequate volume   Culture   Final    NO GROWTH 5 DAYS Performed at [facility], [place] [address], [place], [place] [address]    Report Status 12/06/2021 FINAL  Final  Resp Panel by RT-PCR (Flu A&B, Covid) Nasopharyngeal Swab     Status: None   Collection Time: 12/02/21  3:30 AM   Specimen: Nasopharyngeal Swab; Nasopharyngeal(NP) swabs in vial transport medium  Result Value Ref Range Status   SARS Coronavirus 2 by RT PCR NEGATIVE NEGATIVE Final    Comment: (NOTE) SARS-CoV-2 target nucleic acids are NOT DETECTED.  The SARS-CoV-2 RNA is generally detectable in upper  respiratory specimens during the acute phase of infection. The lowest concentration of SARS-CoV-2 viral copies this assay can detect is 138 copies/mL. A negative result does not preclude SARS-Cov-2 infection and should not be used as the sole basis for treatment or other patient management decisions. A negative result may occur with  improper specimen collection/handling, submission of specimen other than nasopharyngeal swab, presence of viral mutation(s) within the areas targeted by this assay, and inadequate number of viral copies(<138 copies/mL). A negative result must be combined with clinical observations, patient history, and epidemiological information. The expected result is Negative.  Fact Sheet for Patients:  EntrepreneurPulse.com.au  Fact Sheet for Healthcare Providers:  IncredibleEmployment.be  This test is no t yet approved or cleared by the [place] FDA and  has been authorized for detection and/or diagnosis of SARS-CoV-2 by FDA under an Emergency Use Authorization (EUA). This EUA will remain  in effect (meaning this test can be used) for the duration of the COVID-19 declaration under Section 564(b)(1) of the Act, 21 U.S.C.section 360bbb-3(b)(1), unless the authorization is terminated  or revoked sooner.       Influenza A by PCR NEGATIVE NEGATIVE Final   Influenza B by PCR NEGATIVE NEGATIVE Final    Comment: (NOTE) The Xpert Xpress SARS-CoV-2/FLU/RSV plus assay is intended as an aid in the diagnosis of influenza from Nasopharyngeal swab specimens and should not be used as a sole basis for treatment. Nasal washings and aspirates are unacceptable for Xpert Xpress SARS-CoV-2/FLU/RSV testing.  Fact Sheet for Patients: EntrepreneurPulse.com.au  Fact Sheet for Healthcare Providers: IncredibleEmployment.be  This test is not yet approved or cleared by the [place] FDA and has been  authorized for detection and/or diagnosis of SARS-CoV-2 by FDA under an Emergency Use Authorization (EUA). This EUA will remain in effect (meaning this test can be used) for the duration of the COVID-19 declaration under Section 564(b)(1) of the Act, 21 U.S.C. section 360bbb-3(b)(1), unless the authorization is terminated or revoked.  Performed at [facility], [place] [address] [place],  [address]   Blood Culture (routine x 2)     Status: None   Collection Time: 12/02/21  4:25 AM   Specimen: BLOOD LEFT HAND  Result Value Ref Range Status   Specimen Description BLOOD LEFT HAND  Final   Special Requests AEROBIC BOTTLE ONLY Blood Culture adequate volume  Final  Culture   Final    NO GROWTH 5 DAYS Performed at [facility], [place] [address], [place], [place] [address]    Report Status 12/07/2021 FINAL  Final  Urine Culture     Status: Abnormal   Collection Time: 12/03/21  6:08 AM   Specimen: In/Out Cath Urine  Result Value Ref Range Status   Specimen Description IN/OUT CATH URINE  Final   Special Requests   Final    NONE Performed at [facility], [place] [address], [place], [place] [address]    Culture MULTIPLE SPECIES PRESENT, SUGGEST RECOLLECTION (A)  Final   Report Status 12/04/2021 FINAL  Final  Surgical PCR screen     Status: None   Collection Time: 12/04/21  5:01 AM   Specimen: Nasal Mucosa; Nasal Swab  Result Value Ref Range Status   MRSA, PCR NEGATIVE NEGATIVE Final   Staphylococcus aureus NEGATIVE NEGATIVE Final    Comment: (NOTE) The Xpert SA Assay (FDA approved for NASAL specimens in patients 62 years of age and older), is one component of a comprehensive surveillance program. It is not intended to diagnose infection nor to guide or monitor treatment. Performed at [facility], [place] [address] [place], [place] [address]   Aerobic/Anaerobic Culture w Gram Stain (surgical/deep wound)     Status: None   Collection Time: 12/04/21 10:19 AM    Specimen: Wound  Result Value Ref Range Status   Specimen Description WOUND  Final   Special Requests RIGHT STUMP SPEC A  Final   Gram Stain   Final    FEW WBC PRESENT, PREDOMINANTLY MONONUCLEAR RARE GRAM POSITIVE COCCI IN PAIRS    Culture   Final    FEW PROTEUS MIRABILIS FEW ENTEROCOCCUS FAECALIS NO ANAEROBES ISOLATED Performed at [facility], [place] [address], [place], [place] [address]    Report Status 12/09/2021 FINAL  Final   Organism ID, Bacteria PROTEUS MIRABILIS  Final   Organism ID, Bacteria ENTEROCOCCUS FAECALIS  Final      Susceptibility   Enterococcus faecalis - MIC*    AMPICILLIN <=2 SENSITIVE Sensitive     VANCOMYCIN 1 SENSITIVE Sensitive     GENTAMICIN SYNERGY SENSITIVE Sensitive     * FEW ENTEROCOCCUS FAECALIS   Proteus mirabilis - MIC*    AMPICILLIN <=2 SENSITIVE Sensitive     CEFAZOLIN <=4 SENSITIVE Sensitive     CEFEPIME <=0.12 SENSITIVE Sensitive     CEFTAZIDIME <=1 SENSITIVE Sensitive     CEFTRIAXONE <=0.25 SENSITIVE Sensitive     CIPROFLOXACIN <=0.25 SENSITIVE Sensitive     GENTAMICIN <=1 SENSITIVE Sensitive     IMIPENEM 2 SENSITIVE Sensitive     TRIMETH/SULFA <=20 SENSITIVE Sensitive     AMPICILLIN/SULBACTAM <=2 SENSITIVE Sensitive     PIP/TAZO <=4 SENSITIVE Sensitive     * FEW PROTEUS MIRABILIS       Radiology Studies: No results found.   LOS: 10 days   [facility] Pager on [email or web address]  12/11/2021, 7:30 AM

## 2021-12-11 NOTE — Progress Notes (Signed)
Daily Progress Note   Patient Name: Roger Vazquez       Date: 12/11/2021 DOB: 22-Jun-1951  Age: 71 y.o. MRN#: [id number] Attending Physician: Little Ishikawa, MD Primary Care Physician: [facility] Admit Date: 12/01/2021  Reason for Consultation/Follow-up: Establishing goals of care  Patient Profile/HPI:  Roger Vazquez is a 71 y.o. male who presented with wound dehiscence of R BKA performed 10/20/20. Revision to R AKA recommended but pt has been declining procedure. PMH: ESRD on hemodialysis, diabetes mellitus, hypertension, lower extremity DVT, hx of L BKA.   Palliative care has been asked to discuss goals of care in the setting of patient's desire to stop aggressive modalities of life supportive interventions. Plan made for transition to full comfort measures only, stop HD and request hospice bed.  Subjective: Evaluated patient, chart reviewed including labs, imaging and medication history.  Noted patient has bed today at [facility].  Roger Vazquez is in bed, appears comfortable. Nods head "no" to discomfort or pain.    Review of Systems  Unable to perform ROS: Acuity of condition    Physical Exam Vitals and nursing note reviewed.  Constitutional:      Appearance: He is ill-appearing.  Cardiovascular:     Pulses: Normal pulses.  Pulmonary:     Effort: Pulmonary effort is normal.            Vital Signs: BP (!) 157/68 (BP Location: Left Arm)    Pulse 82    Temp 97.7 F (36.5 C) (Oral)    Resp 17    Ht 5\' 6"  (1.676 m)    Wt 50.1 kg    SpO2 97%    BMI 17.83 kg/m  SpO2: SpO2: 97 % O2 Device: O2 Device: Room Air O2 Flow Rate: O2 Flow Rate (L/min): 0 L/min  Intake/output summary:  Intake/Output Summary (Last 24 hours) at 12/11/2021 1149 Last data filed at 12/11/2021  4503 Gross per 24 hour  Intake 360 ml  Output 0 ml  Net 360 ml   LBM: Last BM Date :  (Unknown) Baseline Weight: Weight: 50.8 kg Most recent weight: Weight: 50.1 kg       Palliative Assessment/Data: PPS: 10%      Patient Active Problem List   Diagnosis Date Noted   Pressure injury of skin 12/11/2021  Protein-calorie malnutrition, severe 12/04/2021   UTI (urinary tract infection) 12/03/2021   Cellulitis of right lower extremity 12/02/2021   Postoperative wound dehiscence 12/02/2021   Diverticulosis of colon without hemorrhage    Acute gastric ulcer without hemorrhage or perforation    Acute osteomyelitis of right calcaneus (HCC)    Chronic anticoagulation    Heel ulcer, right, with unspecified severity ([place]) 10/13/2021   DVT (deep venous thrombosis) ([place]) 10/13/2021   Lymphadenopathy 10/13/2021   COVID-19 16/07/9603   Acute metabolic encephalopathy 54/06/8118   COVID-19 virus infection 08/12/2021   Peripheral vascular disease ([place]) 06/09/2021   S/P BKA (below knee amputation), left ([place]) 06/08/2021   Status post amputation of foot through metatarsal bone ([place])    Altered mental status 05/19/2021   Left foot infection    Alcohol abuse 05/16/2021   Erectile dysfunction 05/16/2021   Hydronephrosis 05/16/2021   Acute hepatitis C 05/16/2021   Diabetic retinopathy ([place]) 05/16/2021   History of hemodialysis 05/16/2021   Iron deficiency anemia 05/16/2021   Malignant neoplasm of upper lobe, left bronchus or lung ([place]) 05/16/2021   Noncompliance with medication regimen 05/16/2021   Non-small cell lung cancer ([place]) 05/16/2021   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   Vitreous hemorrhage, left eye ([place]) 05/16/2021   Cellulitis of left lower extremity    Gangrene ([place]) 04/27/2021   Diarrhea, unspecified 04/29/2019   Mild protein-calorie malnutrition ([place]) 08/20/2018   Fever    Infection of AV graft for dialysis ([place]) 08/05/2018    Symptomatic anemia 08/05/2018   Hypokalemia 08/05/2018   GERD (gastroesophageal reflux disease) 08/05/2018   Sepsis due to unspecified Staphylococcus ([place]) 08/02/2018   ESRD on hemodialysis ([place])    Bacteremia due to methicillin susceptible Staphylococcus aureus (MSSA) 07/28/2018   Sepsis ([place]) 07/27/2018   Headache 01/14/2017   Fluid overload, unspecified 12/13/2015   Anemia in chronic kidney disease 12/09/2015   Chronic kidney disease, stage 5 ([place]) 12/09/2015   Coagulation defect, unspecified ([place]) 12/09/2015   Encounter for adjustment and management of vascular access device 12/09/2015   Other secondary pulmonary hypertension ([place]) 12/09/2015   Pruritus, unspecified 12/09/2015   Secondary hyperparathyroidism of renal origin ([place]) 12/09/2015   Shortness of breath 12/09/2015   Abdominal pain, other specified site 10/07/2011   Nausea and vomiting 10/07/2011   VITAMIN D DEFICIENCY 03/19/2008   FRACTURE, TIBIA 02/09/2008   HTN (hypertension) 11/25/2007   HEPATITIS C 05/27/2007   Type 2 diabetes mellitus with chronic kidney disease on chronic dialysis ([place]) 05/27/2007   ERECTILE DYSFUNCTION 05/27/2007   ABUSE, ALCOHOL, IN REMISSION 05/27/2007   ABUSE, OPIOID, IN REMISSION 05/27/2007   ABUSE, COCAINE, IN REMISSION 05/27/2007    Palliative Care Assessment & Plan    Assessment/Recommendations/Plan  ESRD on HD, amputation wound dehiscences with cellulitis- dying Comfort measures only, no escalation, no further HD Continue current comfort measures as ordered Patient appears stable for transfer   Code Status: DNR  Prognosis:  < 2 weeks  Discharge Planning: Hospice facility  Thank you for allowing the Palliative Medicine Team to assist in the care of this patient.   Mariana Kaufman, AGNP-C Palliative Medicine   Please contact Palliative Medicine Team phone at [phone] for questions and concerns.

## 2021-12-11 NOTE — Progress Notes (Signed)
Engineer, maintenance [facility]) Hospital Liaison note.    Received request from [place] for family interest in [facility] with request for transfer today. Chart reviewed and eligibility confirmed.    Spoke to family to confirm interest and explain services. Family agreeable to transfer today. CSW aware.  Registration paperwork completed. Dr. Orpah Melter to assume care per family request.     RN please call report to [phone]. Please arrange transport for patient once consents are complete.    Thank you,   Clementeen Hoof, BSN, RN [place] (listed on AMION under Hospice and [place] [facility] [phone]   [phone]

## 2021-12-11 NOTE — TOC Transition Note (Signed)
Transition of Care [facility]) - CM/SW Discharge Note   Patient Details  Name: Roger Vazquez MRN: [id number] Date of Birth: 15-May-1951  Transition of Care [facility]) CM/SW Contact:  Milinda Antis, [place] Phone Number: 12/11/2021, 1:54 PM   Clinical Narrative:    Patient will DC to: [facility]  Anticipated DC date: 12/11/2021 Transport by: Corey Harold   Per MD [facility]   DC packet on chart. Ambulance transport requested for patient.   CSW will sign off for now as social work intervention is no longer needed. Please consult [place] again if new needs arise.     Final next level of care: [place] Barriers to Discharge: No Barriers Identified   Patient Goals and CMS Choice        Discharge Placement              Patient chooses bed at:  [facility]) Patient to be transferred to facility by: [place] Name of family member notified: Lavel, Rieman (Sister)   [phone] Patient and family notified of of transfer: 12/11/21  Discharge Plan and Services                                     Social Determinants of Health (SDOH) Interventions     Readmission Risk Interventions Readmission Risk Prevention Plan 05/30/2021  Transportation Screening Complete  SW Recovery Care/Counseling Consult Complete  Skilled Nursing Facility Complete  Some recent data might be hidden

## 2021-12-11 NOTE — Discharge Summary (Signed)
Physician Discharge Summary  Roger Vazquez UEA:[id number] DOB: 01/04/1951 DOA: 12/01/2021  PCP: [facility]  Admit date: 12/01/2021 Discharge date: 12/11/2021  Admitted From: Home Disposition:  Hospice house  Discharge Condition:Grim  CODE STATUS:DNR    Brief/Interim Summary: 71 y.o. 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  He was transferred to hospital.  Evaluated by vascular surgery.  Was hospitalized for further management.  He was offered above-knee amputation since chances of healing would be higher.  But patient refused and wanted to just do a revision of his BKA.  This was performed on 2/20.   Ongoing conversation with vascular surgery and family members about patient's prognosis.  He continues to refuse surgical advice recommending AKA given poor wound healing and increased risk for infection.  Patient indicates he is "done" as below, palliative care consulted for further discussion with patient and family about goals of care -currently on comfort measures pending transition to hospice house.  Patient has been accepted to [facility] today.Medically stable for discharge.  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)   HTN (hypertension)   ESRD on hemodialysis (HCC)   Anemia in chronic kidney disease   Acute metabolic encephalopathy   DVT (deep venous thrombosis) (HCC)   Postoperative wound dehiscence   UTI (urinary tract infection)   Protein-calorie malnutrition, severe   Pressure injury of  skin    Discharge Instructions   Allergies as of 12/11/2021       Reactions   Sildenafil Other (See Comments)   Pt stated, "Makes my Blood Pressure drop"        Medication List     STOP taking these medications    acetaminophen 325 MG tablet Commonly known as: TYLENOL   amLODipine 10 MG tablet Commonly known as: NORVASC   apixaban 5 MG Tabs tablet Commonly known as: ELIQUIS   aspirin 81 MG chewable tablet   calcium acetate 667 MG capsule Commonly known as: PHOSLO   ferrous sulfate 325 (65 FE) MG tablet   hydrocortisone 25 MG suppository Commonly known as: ANUSOL-HC   lidocaine-prilocaine cream Commonly known as: EMLA   multivitamin with minerals Tabs tablet   NOVASOURCE RENAL PO   oxybutynin 5 MG tablet Commonly known as: DITROPAN   oxyCODONE 5 MG immediate release tablet Commonly known as: Oxy IR/ROXICODONE   pantoprazole 40 MG tablet Commonly known as: PROTONIX   polyethylene glycol 17 g packet Commonly known as: MIRALAX / GLYCOLAX   senna 8.6 MG Tabs tablet Commonly known as: SENOKOT        Allergies  Allergen Reactions   Sildenafil Other (See Comments)    Pt stated, "Makes my Blood Pressure drop"    Consultations: Nephrology, Vascular surgery, Palliative care   Procedures/Studies: DG Tibia/Fibula Right  Result Date: 12/01/2021 CLINICAL DATA:  Postop infection. EXAM: RIGHT TIBIA AND FIBULA - 2 VIEW COMPARISON:  None. FINDINGS: Below the knee amputation. Intramedullary rod with proximal locking screws in the proximal tibia. Tibia and fibular resection margins are smooth. No erosions, periosteal reaction, or bone destruction. Mild  skin and soft tissue irregularity about the anterior aspect of the distal stump. No tracking soft tissue air. No radiopaque foreign body. The bones are under mineralized. Vascular calcifications are seen. IMPRESSION: 1. Below the knee amputation with intramedullary rod in the proximal tibia. No radiographic  findings of osteomyelitis. 2. Mild skin and soft tissue irregularity about the anterior aspect of the distal stump may represent postsurgical change or infection. Electronically Signed   By: Keith Rake M.D.   On: 12/01/2021 17:24   CT Head Wo Contrast  Result Date: 12/01/2021 CLINICAL DATA:  Mental status changes. EXAM: CT HEAD WITHOUT CONTRAST TECHNIQUE: Contiguous axial images were obtained from the base of the skull through the vertex without intravenous contrast. RADIATION DOSE REDUCTION: This exam was performed according to the departmental dose-optimization program which includes automated exposure control, adjustment of the mA and/or kV according to patient size and/or use of iterative reconstruction technique. COMPARISON:  08/11/2021 FINDINGS: Brain: There is no evidence for acute hemorrhage, hydrocephalus, mass lesion, or abnormal extra-axial fluid collection. No definite CT evidence for acute infarction. Diffuse loss of parenchymal volume is consistent with atrophy. Patchy low attenuation in the deep hemispheric and periventricular white matter is nonspecific, but likely reflects chronic microvascular ischemic demyelination. 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 Vascular: No hyperdense vessel or unexpected calcification. Skull: Normal. Negative for fracture or focal lesion. Sinuses/Orbits: The visualized paranasal sinuses and mastoid air cells are clear. Visualized portions of the globes and intraorbital fat are unremarkable. Other: None IMPRESSION: 1. No acute intracranial abnormality. 2. Atrophy with chronic small vessel ischemic disease. Electronically Signed   By: Misty Stanley M.D.   On: 12/01/2021 19:05   CT TIBIA FIBULA RIGHT W CONTRAST  Result Date: 12/02/2021 CLINICAL DATA:  Status post below the knee amputation. Signs of soft tissue infection. EXAM: CT OF THE LOWER RIGHT EXTREMITY WITH CONTRAST TECHNIQUE: Multidetector CT imaging of the lower right extremity was  performed according to the standard protocol following intravenous contrast administration. RADIATION DOSE REDUCTION: This exam was performed according to the departmental dose-optimization program which includes automated exposure control, adjustment of the mA and/or kV according to patient size and/or use of iterative reconstruction technique. CONTRAST:  141mL OMNIPAQUE IOHEXOL 350 MG/ML SOLN COMPARISON:  None. FINDINGS: Bones/Joint/Cartilage The bones appear osteopenic. The patient is status post below the knee amputation. IM rod and screws are again noted within the remaining portions of the proximal tibia. The tibial resection margin remains distinct without bone erosion or bony fragmentation. Along the fibular resection margin the bone appears slightly irregular and there are areas of cortical fragmentation, image 92/7 and image 108/4. No definite bone erosion or signs of periosteal reaction. Ligaments Suboptimally assessed by CT. Muscles and Tendons There is diffuse low-attenuation edema with enhancement along the fascial planes throughout the muscles and soft tissues overlying the tibial and fibular resection margins. Here, there is a focal area of possible phlegmon formation without distinct fluid collection measuring 7.5 by 3.6 cm, image 125/4 and image 90/8, suspicious for underlying myositis. There is no gas identified within this area. No distinct drainable fluid collection noted. Soft tissues Skin thickening and subcutaneous soft tissue stranding within the distal aspect of the stump is concerning for cellulitis. IMPRESSION: 1. Status post below the knee amputation. 2. Skin thickening and subcutaneous soft tissue stranding within the distal aspect of the stump is identified. There is large focal area of indistinct, low-attenuation edema, with enhancement along the fascial planes, throughout the muscles overlying the  tibial and fibular resection margins. Underlying phlegmon formation cannot be excluded.  No soft tissue gas identified. Imaging are concerning for lower extremity cellulitis findings with underlying myositis. 3. Bony fragmentation along the fibular resection margin is identified. This may reflect postsurgical change but bony destruction due to osteomyelitis cannot be excluded with a high degree of certainty. Electronically Signed   By: Kerby Moors M.D.   On: 12/02/2021 05:57   IR Removal Tun Cv Cath W/O FL  Result Date: 12/08/2021 INDICATION: Successful use of AVG for HD x2. Request for tunneled dialysis catheter removal. EXAM: REMOVAL OF TUNNELED HEMODIALYSIS CATHETER MEDICATIONS: 10 mL 1 % lidocaine COMPLICATIONS: None immediate. PROCEDURE: Verbal consent was obtained from the patient following an explanation of the procedure, risks, benefits and alternatives to treatment. A time out was performed prior to the initiation of the procedure. Sterile technique was utilized including mask, sterile gloves, sterile drape, and hand hygiene. ChloraPrep was used to prep the patient's right neck, chest and existing catheter. 1% lidocaine was injected around the catheter and the subcutaneous tunnel. The catheter was dissected out using scissors and curved hemostats until the cuff was freed from the surrounding fibrous sheath. The catheter was removed intact. Hemostasis was obtained with manual compression. A dressing was placed. The patient tolerated the procedure well without immediate post procedural complication. IMPRESSION: Successful removal of tunneled dialysis catheter. Read by: Narda Rutherford, AGNP-BC Electronically Signed   By: Aletta Edouard M.D.   On: 12/08/2021 14:53   DG Chest Port 1 View  Result Date: 12/01/2021 CLINICAL DATA:  Postoperative infection EXAM: PORTABLE CHEST 1 VIEW COMPARISON:  08/13/2021 FINDINGS: Central line tip in the upper right atrium. Heart size is normal. Chronic aortic atherosclerosis. Previous lobectomy on the left. No sign of pulmonary infiltrate. No visible  effusion. Old healed rib fracture on the left. Left axillary region vascular stent. IMPRESSION: No active disease. Electronically Signed   By: Nelson Chimes M.D.   On: 12/01/2021 17:26   VAS [place] UPPER EXTREMITY ARTERIAL DUPLEX  Result Date: 11/21/2021  UPPER EXTREMITY DUPLEX STUDY Patient Name:  JAMUS LOVING  Date of Exam:   11/21/2021 Medical Rec #: [id number]         Accession #:    [id number] Date of Birth: [birth year]         Patient Gender: M Patient Age:   56 years Exam Location:  [facility] Procedure:      VAS [place] UPPER EXTREMITY ARTERIAL DUPLEX Referring Phys: Jamelle Haring --------------------------------------------------------------------------------  Indications: Pre-access.  Risk Factors:  Hypertension, Diabetes. Other Factors: 11/27/2018: Left forearm AV loop graft.                 08/06/2018: Removal of left arm AV loop graft due to infection. Limitations: Calcification/shadowing Performing Technologist: Ivan Croft  Examination Guidelines: A complete evaluation includes B-mode imaging, spectral Doppler, color Doppler, and power Doppler as needed of all accessible portions of each vessel. Bilateral testing is considered an integral part of a complete examination. Limited examinations for reoccurring indications may be performed as noted.  Right Pre-Dialysis Findings: +-----------------------+----------+--------------------+--------+--------+  Location                PSV (cm/s) Intralum. Diam. (cm) Waveform Comments  +-----------------------+----------+--------------------+--------+--------+  Brachial Antecub. fossa 259        0.40                                    +-----------------------+----------+--------------------+--------+--------+  Radial Art at Wrist     28         0.16                                    +-----------------------+----------+--------------------+--------+--------+  Ulnar Art at Wrist      29         0.16                                     +-----------------------+----------+--------------------+--------+--------+  Left Pre-Dialysis Findings: +-----------------------+----------+--------------------+--------+--------+  Location                PSV (cm/s) Intralum. Diam. (cm) Waveform Comments  +-----------------------+----------+--------------------+--------+--------+  Brachial Antecub. fossa 76         0.41                                    +-----------------------+----------+--------------------+--------+--------+  Radial Art at Wrist     62         0.16                                    +-----------------------+----------+--------------------+--------+--------+  Ulnar Art at Wrist      63         0.16                                    +-----------------------+----------+--------------------+--------+--------+  Electronically signed by Jamelle Haring on 11/21/2021 at 3:25:57 PM.    Final    VAS [place] UPPER EXTREMITY VEIN MAPPING  Result Date: 11/21/2021 UPPER EXTREMITY VEIN MAPPING Patient Name:  LAWRANCE WIEDEMANN  Date of Exam:   11/21/2021 Medical Rec #: [id number]         Accession #:    [id number] Date of Birth: 05-04-1951         Patient Gender: M Patient Age:   6 years Exam Location:  [facility] Procedure:      VAS [place] UPPER EXT VEIN MAPPING (PRE-OP AVF) Referring Phys: Jamelle Haring --------------------------------------------------------------------------------  Indications: Pre-access. History: 11/27/2018: Left forearm AV loop graft.           08/06/2018: Removal of left arm AV loop graft due to infection.  Limitations: Small veins Performing Technologist: Ivan Croft  Examination Guidelines: A complete evaluation includes B-mode imaging, spectral Doppler, color Doppler, and power Doppler as needed of all accessible portions of each vessel. Bilateral testing is considered an integral part of a complete examination. Limited examinations for reoccurring indications may be performed as noted.  +-----------------+-------------+----------+--------------+  Right Cephalic    Diameter (cm) Depth (cm)    Findings     +-----------------+-------------+----------+--------------+  Shoulder                                   not visualized  +-----------------+-------------+----------+--------------+  Prox upper arm                             not visualized  +-----------------+-------------+----------+--------------+  Mid upper arm  not visualized  +-----------------+-------------+----------+--------------+  Dist upper arm                             not visualized  +-----------------+-------------+----------+--------------+  Antecubital fossa                          not visualized  +-----------------+-------------+----------+--------------+  Prox forearm                               not visualized  +-----------------+-------------+----------+--------------+  Mid forearm                                not visualized  +-----------------+-------------+----------+--------------+  Dist forearm                               not visualized  +-----------------+-------------+----------+--------------+  Wrist                                      not visualized  +-----------------+-------------+----------+--------------+ +-----------------+-------------+----------+--------+  Right Basilic     Diameter (cm) Depth (cm) Findings  +-----------------+-------------+----------+--------+  Prox upper arm        0.20                           +-----------------+-------------+----------+--------+  Mid upper arm         0.17                           +-----------------+-------------+----------+--------+  Dist upper arm        0.16                           +-----------------+-------------+----------+--------+  Antecubital fossa     0.15                           +-----------------+-------------+----------+--------+  Prox forearm          0.17                            +-----------------+-------------+----------+--------+  Mid forearm           0.14                           +-----------------+-------------+----------+--------+  Distal forearm        0.12                           +-----------------+-------------+----------+--------+  Wrist                 0.10                           +-----------------+-------------+----------+--------+ +-----------------+-------------+----------+--------------+  Left Cephalic     Diameter (cm) Depth (cm)    Findings     +-----------------+-------------+----------+--------------+  Shoulder              0.15                                 +-----------------+-------------+----------+--------------+  Prox upper arm        0.11                                 +-----------------+-------------+----------+--------------+  Mid upper arm         0.09                                 +-----------------+-------------+----------+--------------+  Dist upper arm        0.07                                 +-----------------+-------------+----------+--------------+  Antecubital fossa                          not visualized  +-----------------+-------------+----------+--------------+  Prox forearm          0.14                                 +-----------------+-------------+----------+--------------+  Mid forearm           0.11                                 +-----------------+-------------+----------+--------------+  Dist forearm          0.15                                 +-----------------+-------------+----------+--------------+  Wrist                 0.10                                 +-----------------+-------------+----------+--------------+ +-----------------+-------------+----------+----------------+  Left Basilic      Diameter (cm) Depth (cm)     Findings      +-----------------+-------------+----------+----------------+  Prox upper arm        0.19                 chronic thrombus  +-----------------+-------------+----------+----------------+   Mid upper arm         0.17                 chronic thrombus  +-----------------+-------------+----------+----------------+  Dist upper arm        0.17                 chronic thrombus  +-----------------+-------------+----------+----------------+  Antecubital fossa     0.19                 chronic thrombus  +-----------------+-------------+----------+----------------+  Prox forearm                                not visualized   +-----------------+-------------+----------+----------------+  Mid forearm                                 not visualized   +-----------------+-------------+----------+----------------+  Distal forearm  not visualized   +-----------------+-------------+----------+----------------+  Wrist                                       not visualized   +-----------------+-------------+----------+----------------+ *See table(s) above for measurements and observations.  Diagnosing physician: Jamelle Haring Electronically signed by Jamelle Haring on 11/21/2021 at 3:25:34 PM.    Final      Subjective: No acute issues/events overnight   Discharge Exam: Vitals:   12/10/21 1700 12/11/21 0948  BP: 128/69 (!) 157/68  Pulse: 71 82  Resp: 17 17  Temp: 98 F (36.7 C) 97.7 F (36.5 C)  SpO2: 94% 97%   Vitals:   12/09/21 0457 12/09/21 0932 12/10/21 1700 12/11/21 0948  BP: (!) 127/59 138/65 128/69 (!) 157/68  Pulse: 78 75 71 82  Resp: 18 16 17 17   Temp: 98.3 F (36.8 C) 97.8 F (36.6 C) 98 F (36.7 C) 97.7 F (36.5 C)  TempSrc: Oral Oral  Oral  SpO2: 100% 100% 94% 97%  Weight: 50.1 kg     Height:        General: Pt is not in acute distress Cardiovascular: RRR, S1/S2 +, no rubs, no gallops Respiratory: CTA bilaterally, no wheezing, no rhonchi Abdominal: Soft, NT, ND, bowel sounds + Extremities: no edema, no cyanosis; RLE bandage clean/dry/intact   The results of significant diagnostics from this hospitalization (including imaging, microbiology, ancillary  and laboratory) are listed below for reference.     Microbiology: Recent Results (from the past 240 hour(s))  Blood Culture (routine x 2)     Status: None   Collection Time: 12/01/21  6:15 PM   Specimen: BLOOD  Result Value Ref Range Status   Specimen Description BLOOD SITE NOT SPECIFIED  Final   Special Requests   Final    BOTTLES DRAWN AEROBIC AND ANAEROBIC Blood Culture adequate volume   Culture   Final    NO GROWTH 5 DAYS Performed at [facility], [address] [place], [place] [address]    Report Status 12/06/2021 FINAL  Final  Resp Panel by RT-PCR (Flu A&B, Covid) Nasopharyngeal Swab     Status: None   Collection Time: 12/02/21  3:30 AM   Specimen: Nasopharyngeal Swab; Nasopharyngeal(NP) swabs in vial transport medium  Result Value Ref Range Status   SARS Coronavirus 2 by RT PCR NEGATIVE NEGATIVE Final    Comment: (NOTE) SARS-CoV-2 target nucleic acids are NOT DETECTED.  The SARS-CoV-2 RNA is generally detectable in upper respiratory specimens during the acute phase of infection. The lowest concentration of SARS-CoV-2 viral copies this assay can detect is 138 copies/mL. A negative result does not preclude SARS-Cov-2 infection and should not be used as the sole basis for treatment or other patient management decisions. A negative result may occur with  improper specimen collection/handling, submission of specimen other than nasopharyngeal swab, presence of viral mutation(s) within the areas targeted by this assay, and inadequate number of viral copies(<138 copies/mL). A negative result must be combined with clinical observations, patient history, and epidemiological information. The expected result is Negative.  Fact Sheet for Patients:  EntrepreneurPulse.com.au  Fact Sheet for Healthcare Providers:  IncredibleEmployment.be  This test is no t yet approved or cleared by the [place] FDA and  has been authorized for  detection and/or diagnosis of SARS-CoV-2 by FDA under an Emergency Use Authorization (EUA). This EUA will remain  in effect (meaning this  test can be used) for the duration of the COVID-19 declaration under Section 564(b)(1) of the Act, 21 U.S.C.section 360bbb-3(b)(1), unless the authorization is terminated  or revoked sooner.       Influenza A by PCR NEGATIVE NEGATIVE Final   Influenza B by PCR NEGATIVE NEGATIVE Final    Comment: (NOTE) The Xpert Xpress SARS-CoV-2/FLU/RSV plus assay is intended as an aid in the diagnosis of influenza from Nasopharyngeal swab specimens and should not be used as a sole basis for treatment. Nasal washings and aspirates are unacceptable for Xpert Xpress SARS-CoV-2/FLU/RSV testing.  Fact Sheet for Patients: EntrepreneurPulse.com.au  Fact Sheet for Healthcare Providers: IncredibleEmployment.be  This test is not yet approved or cleared by the [place] FDA and has been authorized for detection and/or diagnosis of SARS-CoV-2 by FDA under an Emergency Use Authorization (EUA). This EUA will remain in effect (meaning this test can be used) for the duration of the COVID-19 declaration under Section 564(b)(1) of the Act, 21 U.S.C. section 360bbb-3(b)(1), unless the authorization is terminated or revoked.  Performed at [facility], [place] [address] [place], [place] [address]   Blood Culture (routine x 2)     Status: None   Collection Time: 12/02/21  4:25 AM   Specimen: BLOOD LEFT HAND  Result Value Ref Range Status   Specimen Description BLOOD LEFT HAND  Final   Special Requests AEROBIC BOTTLE ONLY Blood Culture adequate volume  Final   Culture   Final    NO GROWTH 5 DAYS Performed at [facility], [place] [address], [place], [place] [address]    Report Status 12/07/2021 FINAL  Final  Urine Culture     Status: Abnormal   Collection Time: 12/03/21  6:08 AM   Specimen: In/Out Cath Urine  Result Value  Ref Range Status   Specimen Description IN/OUT CATH URINE  Final   Special Requests   Final    NONE Performed at [facility], [place] [address] [place], [place] [address]    Culture MULTIPLE SPECIES PRESENT, SUGGEST RECOLLECTION (A)  Final   Report Status 12/04/2021 FINAL  Final  Surgical PCR screen     Status: None   Collection Time: 12/04/21  5:01 AM   Specimen: Nasal Mucosa; Nasal Swab  Result Value Ref Range Status   MRSA, PCR NEGATIVE NEGATIVE Final   Staphylococcus aureus NEGATIVE NEGATIVE Final    Comment: (NOTE) The Xpert SA Assay (FDA approved for NASAL specimens in patients 26 years of age and older), is one component of a comprehensive surveillance program. It is not intended to diagnose infection nor to guide or monitor treatment. Performed at [facility], [place] [address], [place], [place] [address]   Aerobic/Anaerobic Culture w Gram Stain (surgical/deep wound)     Status: None   Collection Time: 12/04/21 10:19 AM   Specimen: Wound  Result Value Ref Range Status   Specimen Description WOUND  Final   Special Requests RIGHT STUMP SPEC A  Final   Gram Stain   Final    FEW WBC PRESENT, PREDOMINANTLY MONONUCLEAR RARE GRAM POSITIVE COCCI IN PAIRS    Culture   Final    FEW PROTEUS MIRABILIS FEW ENTEROCOCCUS FAECALIS NO ANAEROBES ISOLATED Performed at [facility], [place] [address] [place],  [address]    Report Status 12/09/2021 FINAL  Final   Organism ID, Bacteria PROTEUS MIRABILIS  Final   Organism ID, Bacteria ENTEROCOCCUS FAECALIS  Final  Susceptibility   Enterococcus faecalis - MIC*    AMPICILLIN <=2 SENSITIVE Sensitive     VANCOMYCIN 1 SENSITIVE Sensitive     GENTAMICIN SYNERGY SENSITIVE Sensitive     * FEW ENTEROCOCCUS FAECALIS   Proteus mirabilis - MIC*    AMPICILLIN <=2 SENSITIVE Sensitive     CEFAZOLIN <=4 SENSITIVE Sensitive     CEFEPIME <=0.12 SENSITIVE Sensitive     CEFTAZIDIME <=1 SENSITIVE Sensitive     CEFTRIAXONE  <=0.25 SENSITIVE Sensitive     CIPROFLOXACIN <=0.25 SENSITIVE Sensitive     GENTAMICIN <=1 SENSITIVE Sensitive     IMIPENEM 2 SENSITIVE Sensitive     TRIMETH/SULFA <=20 SENSITIVE Sensitive     AMPICILLIN/SULBACTAM <=2 SENSITIVE Sensitive     PIP/TAZO <=4 SENSITIVE Sensitive     * FEW PROTEUS MIRABILIS     Labs: BNP (last 3 results) No results for input(s): BNP in the last 8760 hours. Basic Metabolic Panel: Recent Labs  Lab 12/06/21 0413 12/08/21 0807  NA 130* 131*  K 3.4* 3.7  CL 91* 95*  CO2 24 20*  GLUCOSE 121* 74  BUN 41* 34*  CREATININE 6.61* 6.00*  CALCIUM 9.0 9.1  PHOS  --  4.7*   Liver Function Tests: Recent Labs  Lab 12/08/21 0807  ALBUMIN 2.3*   No results for input(s): LIPASE, AMYLASE in the last 168 hours. No results for input(s): AMMONIA in the last 168 hours. CBC: Recent Labs  Lab 12/05/21 0348 12/06/21 0413 12/07/21 0443 12/08/21 0806  WBC 8.9 7.4 6.3 7.3  NEUTROABS  --   --   --  5.1  HGB 9.2* 10.2* 10.0* 10.4*  HCT 27.9* 31.1* 30.7* 32.1*  MCV 90.6 89.9 90.6 91.5  PLT 164 201 152 208   Cardiac Enzymes: No results for input(s): CKTOTAL, CKMB, CKMBINDEX, TROPONINI in the last 168 hours. BNP: Invalid input(s): POCBNP CBG: Recent Labs  Lab 12/07/21 0737 12/07/21 1121 12/07/21 1647 12/07/21 2055 12/08/21 1121  GLUCAP 77 77 92 85 95   D-Dimer No results for input(s): DDIMER in the last 72 hours. Hgb A1c No results for input(s): HGBA1C in the last 72 hours. Lipid Profile No results for input(s): CHOL, HDL, LDLCALC, TRIG, CHOLHDL, LDLDIRECT in the last 72 hours. Thyroid function studies No results for input(s): TSH, T4TOTAL, T3FREE, THYROIDAB in the last 72 hours.  Invalid input(s): FREET3 Anemia work up No results for input(s): VITAMINB12, FOLATE, FERRITIN, TIBC, IRON, RETICCTPCT in the last 72 hours. Urinalysis    Component Value Date/Time   COLORURINE BROWN (A) 12/03/2021 0543   APPEARANCEUR CLOUDY (A) 12/03/2021 0543    LABSPEC 1.015 12/03/2021 0543   PHURINE 8.0 12/03/2021 0543   GLUCOSEU 100 (A) 12/03/2021 0543   HGBUR LARGE (A) 12/03/2021 0543   BILIRUBINUR MODERATE (A) 12/03/2021 0543   KETONESUR NEGATIVE 12/03/2021 0543   PROTEINUR >300 (A) 12/03/2021 0543   UROBILINOGEN 0.2 10/07/2011 1618   NITRITE POSITIVE (A) 12/03/2021 0543   LEUKOCYTESUR MODERATE (A) 12/03/2021 0543   Sepsis Labs Invalid input(s): PROCALCITONIN,  WBC,  LACTICIDVEN Microbiology Recent Results (from the past 240 hour(s))  Blood Culture (routine x 2)     Status: None   Collection Time: 12/01/21  6:15 PM   Specimen: BLOOD  Result Value Ref Range Status   Specimen Description BLOOD SITE NOT SPECIFIED  Final   Special Requests   Final    BOTTLES DRAWN AEROBIC AND ANAEROBIC Blood Culture adequate volume   Culture   Final    NO GROWTH  5 DAYS Performed at [facility], [place] [address], [place], [place] [address]    Report Status 12/06/2021 FINAL  Final  Resp Panel by RT-PCR (Flu A&B, Covid) Nasopharyngeal Swab     Status: None   Collection Time: 12/02/21  3:30 AM   Specimen: Nasopharyngeal Swab; Nasopharyngeal(NP) swabs in vial transport medium  Result Value Ref Range Status   SARS Coronavirus 2 by RT PCR NEGATIVE NEGATIVE Final    Comment: (NOTE) SARS-CoV-2 target nucleic acids are NOT DETECTED.  The SARS-CoV-2 RNA is generally detectable in upper respiratory specimens during the acute phase of infection. The lowest concentration of SARS-CoV-2 viral copies this assay can detect is 138 copies/mL. A negative result does not preclude SARS-Cov-2 infection and should not be used as the sole basis for treatment or other patient management decisions. A negative result may occur with  improper specimen collection/handling, submission of specimen other than nasopharyngeal swab, presence of viral mutation(s) within the areas targeted by this assay, and inadequate number of viral copies(<138 copies/mL). A negative result  must be combined with clinical observations, patient history, and epidemiological information. The expected result is Negative.  Fact Sheet for Patients:  EntrepreneurPulse.com.au  Fact Sheet for Healthcare Providers:  IncredibleEmployment.be  This test is no t yet approved or cleared by the [place] FDA and  has been authorized for detection and/or diagnosis of SARS-CoV-2 by FDA under an Emergency Use Authorization (EUA). This EUA will remain  in effect (meaning this test can be used) for the duration of the COVID-19 declaration under Section 564(b)(1) of the Act, 21 U.S.C.section 360bbb-3(b)(1), unless the authorization is terminated  or revoked sooner.       Influenza A by PCR NEGATIVE NEGATIVE Final   Influenza B by PCR NEGATIVE NEGATIVE Final    Comment: (NOTE) The Xpert Xpress SARS-CoV-2/FLU/RSV plus assay is intended as an aid in the diagnosis of influenza from Nasopharyngeal swab specimens and should not be used as a sole basis for treatment. Nasal washings and aspirates are unacceptable for Xpert Xpress SARS-CoV-2/FLU/RSV testing.  Fact Sheet for Patients: EntrepreneurPulse.com.au  Fact Sheet for Healthcare Providers: IncredibleEmployment.be  This test is not yet approved or cleared by the [place] FDA and has been authorized for detection and/or diagnosis of SARS-CoV-2 by FDA under an Emergency Use Authorization (EUA). This EUA will remain in effect (meaning this test can be used) for the duration of the COVID-19 declaration under Section 564(b)(1) of the Act, 21 U.S.C. section 360bbb-3(b)(1), unless the authorization is terminated or revoked.  Performed at [facility], [place] [address], [place], [place] [address]   Blood Culture (routine x 2)     Status: None   Collection Time: 12/02/21  4:25 AM   Specimen: BLOOD LEFT HAND  Result Value Ref Range Status   Specimen  Description BLOOD LEFT HAND  Final   Special Requests AEROBIC BOTTLE ONLY Blood Culture adequate volume  Final   Culture   Final    NO GROWTH 5 DAYS Performed at [facility], [place] [address], [place], [place] [address]    Report Status 12/07/2021 FINAL  Final  Urine Culture     Status: Abnormal   Collection Time: 12/03/21  6:08 AM   Specimen: In/Out Cath Urine  Result Value Ref Range Status   Specimen Description IN/OUT CATH URINE  Final   Special Requests   Final    NONE Performed at [facility], [place],  [place] [address]    Culture MULTIPLE SPECIES PRESENT, SUGGEST RECOLLECTION (A)  Final   Report Status 12/04/2021 FINAL  Final  Surgical PCR screen     Status: None   Collection Time: 12/04/21  5:01 AM   Specimen: Nasal Mucosa; Nasal Swab  Result Value Ref Range Status   MRSA, PCR NEGATIVE NEGATIVE Final   Staphylococcus aureus NEGATIVE NEGATIVE Final    Comment: (NOTE) The Xpert SA Assay (FDA approved for NASAL specimens in patients 62 years of age and older), is one component of a comprehensive surveillance program. It is not intended to diagnose infection nor to guide or monitor treatment. Performed at [facility], [place] [address], [place],  [address]   Aerobic/Anaerobic Culture w Gram Stain (surgical/deep wound)     Status: None   Collection Time: 12/04/21 10:19 AM   Specimen: Wound  Result Value Ref Range Status   Specimen Description WOUND  Final   Special Requests RIGHT STUMP SPEC A  Final   Gram Stain   Final    FEW WBC PRESENT, PREDOMINANTLY MONONUCLEAR RARE GRAM POSITIVE COCCI IN PAIRS    Culture   Final    FEW PROTEUS MIRABILIS FEW ENTEROCOCCUS FAECALIS NO ANAEROBES ISOLATED Performed at [facility], [place] [address], [place],  [address]    Report Status 12/09/2021 FINAL  Final   Organism ID, Bacteria PROTEUS MIRABILIS  Final   Organism ID, Bacteria ENTEROCOCCUS FAECALIS  Final      Susceptibility    Enterococcus faecalis - MIC*    AMPICILLIN <=2 SENSITIVE Sensitive     VANCOMYCIN 1 SENSITIVE Sensitive     GENTAMICIN SYNERGY SENSITIVE Sensitive     * FEW ENTEROCOCCUS FAECALIS   Proteus mirabilis - MIC*    AMPICILLIN <=2 SENSITIVE Sensitive     CEFAZOLIN <=4 SENSITIVE Sensitive     CEFEPIME <=0.12 SENSITIVE Sensitive     CEFTAZIDIME <=1 SENSITIVE Sensitive     CEFTRIAXONE <=0.25 SENSITIVE Sensitive     CIPROFLOXACIN <=0.25 SENSITIVE Sensitive     GENTAMICIN <=1 SENSITIVE Sensitive     IMIPENEM 2 SENSITIVE Sensitive     TRIMETH/SULFA <=20 SENSITIVE Sensitive     AMPICILLIN/SULBACTAM <=2 SENSITIVE Sensitive     PIP/TAZO <=4 SENSITIVE Sensitive     * FEW PROTEUS MIRABILIS     Time coordinating discharge: Over 30 minutes  SIGNED:   Little Ishikawa, DO [facility] 12/11/2021, 11:00 AM Pager   If 7PM-7AM, please contact night-coverage [email or web address]

## 2021-12-11 NOTE — Progress Notes (Signed)
Nutrition Brief Note  Chart reviewed. Pt now transitioning to comfort care.  No further nutrition interventions planned at this time.  Please re-consult as needed.    Theone Stanley., MS, RD, LDN (she/her/hers) RD pager number and weekend/on-call pager number located in [place].

## 2021-12-11 NOTE — Progress Notes (Signed)
Report given to Nutritional therapist at [facility]. All questions and concerns were addressed. Patient resting comfortably. Awaiting for PTAR.

## 2022-01-13 DEATH — deceased
